# Patient Record
Sex: Female | Born: 1945
Health system: Southern US, Community
[De-identification: ages and names within clinical notes are randomized; demographics above are authoritative.]

## PROBLEM LIST (undated history)

## (undated) DIAGNOSIS — D638 Anemia in other chronic diseases classified elsewhere: Secondary | ICD-10-CM

## (undated) DIAGNOSIS — M1990 Unspecified osteoarthritis, unspecified site: Secondary | ICD-10-CM

## (undated) DIAGNOSIS — R701 Abnormal plasma viscosity: Secondary | ICD-10-CM

## (undated) DIAGNOSIS — E7800 Pure hypercholesterolemia, unspecified: Secondary | ICD-10-CM

## (undated) DIAGNOSIS — K219 Gastro-esophageal reflux disease without esophagitis: Secondary | ICD-10-CM

## (undated) DIAGNOSIS — I340 Nonrheumatic mitral (valve) insufficiency: Secondary | ICD-10-CM

## (undated) DIAGNOSIS — I5032 Chronic diastolic (congestive) heart failure: Secondary | ICD-10-CM

## (undated) DIAGNOSIS — I10 Essential (primary) hypertension: Secondary | ICD-10-CM

## (undated) DIAGNOSIS — R7989 Other specified abnormal findings of blood chemistry: Secondary | ICD-10-CM

## (undated) DIAGNOSIS — D649 Anemia, unspecified: Secondary | ICD-10-CM

## (undated) DIAGNOSIS — F419 Anxiety disorder, unspecified: Secondary | ICD-10-CM

## (undated) DIAGNOSIS — N183 Chronic kidney disease, stage 3 unspecified: Secondary | ICD-10-CM

## (undated) DIAGNOSIS — Z9289 Personal history of other medical treatment: Secondary | ICD-10-CM

## (undated) DIAGNOSIS — E785 Hyperlipidemia, unspecified: Secondary | ICD-10-CM

## (undated) DIAGNOSIS — F0390 Unspecified dementia without behavioral disturbance: Secondary | ICD-10-CM

## (undated) DIAGNOSIS — J189 Pneumonia, unspecified organism: Secondary | ICD-10-CM

## (undated) DIAGNOSIS — I447 Left bundle-branch block, unspecified: Secondary | ICD-10-CM

## (undated) DIAGNOSIS — E538 Deficiency of other specified B group vitamins: Secondary | ICD-10-CM

## (undated) DIAGNOSIS — Z9889 Other specified postprocedural states: Secondary | ICD-10-CM

## (undated) HISTORY — DX: Essential (primary) hypertension: I10

## (undated) HISTORY — DX: Unspecified osteoarthritis, unspecified site: M19.90

---
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
## 1999-10-29 ENCOUNTER — Encounter: Payer: Self-pay | Admitting: Obstetrics and Gynecology

## 1999-10-29 ENCOUNTER — Encounter: Admission: RE | Admit: 1999-10-29 | Discharge: 1999-10-29 | Payer: Self-pay | Admitting: Obstetrics and Gynecology

## 2000-11-25 ENCOUNTER — Encounter: Admission: RE | Admit: 2000-11-25 | Discharge: 2000-11-25 | Payer: Self-pay | Admitting: Obstetrics and Gynecology

## 2000-11-25 ENCOUNTER — Encounter: Payer: Self-pay | Admitting: Obstetrics and Gynecology

## 2002-03-31 ENCOUNTER — Encounter: Admission: RE | Admit: 2002-03-31 | Discharge: 2002-03-31 | Payer: Self-pay | Admitting: Internal Medicine

## 2002-03-31 ENCOUNTER — Encounter: Payer: Self-pay | Admitting: Obstetrics and Gynecology

## 2002-07-20 HISTORY — PX: COLONOSCOPY: SHX174

## 2002-07-20 HISTORY — PX: ESOPHAGOGASTRODUODENOSCOPY: SHX1529

## 2002-08-18 ENCOUNTER — Encounter (INDEPENDENT_AMBULATORY_CARE_PROVIDER_SITE_OTHER): Payer: Self-pay | Admitting: Specialist

## 2002-08-18 ENCOUNTER — Ambulatory Visit (HOSPITAL_COMMUNITY): Admission: RE | Admit: 2002-08-18 | Discharge: 2002-08-18 | Payer: Self-pay | Admitting: Gastroenterology

## 2004-05-21 HISTORY — PX: CARDIAC CATHETERIZATION: SHX172

## 2004-05-30 ENCOUNTER — Inpatient Hospital Stay (HOSPITAL_COMMUNITY): Admission: EM | Admit: 2004-05-30 | Discharge: 2004-06-06 | Payer: Self-pay | Admitting: Emergency Medicine

## 2004-06-02 ENCOUNTER — Encounter (INDEPENDENT_AMBULATORY_CARE_PROVIDER_SITE_OTHER): Payer: Self-pay | Admitting: Cardiovascular Disease

## 2004-06-05 ENCOUNTER — Encounter (INDEPENDENT_AMBULATORY_CARE_PROVIDER_SITE_OTHER): Payer: Self-pay | Admitting: Cardiovascular Disease

## 2005-07-22 ENCOUNTER — Encounter: Admission: RE | Admit: 2005-07-22 | Discharge: 2005-07-22 | Payer: Self-pay | Admitting: Obstetrics and Gynecology

## 2006-07-26 ENCOUNTER — Encounter: Admission: RE | Admit: 2006-07-26 | Discharge: 2006-07-26 | Payer: Self-pay | Admitting: Obstetrics and Gynecology

## 2007-08-02 ENCOUNTER — Encounter: Admission: RE | Admit: 2007-08-02 | Discharge: 2007-08-02 | Payer: Self-pay | Admitting: Obstetrics and Gynecology

## 2008-08-09 ENCOUNTER — Ambulatory Visit: Payer: Self-pay | Admitting: Family Medicine

## 2008-08-09 ENCOUNTER — Encounter: Payer: Self-pay | Admitting: *Deleted

## 2008-08-09 DIAGNOSIS — I10 Essential (primary) hypertension: Secondary | ICD-10-CM | POA: Insufficient documentation

## 2008-08-09 DIAGNOSIS — F411 Generalized anxiety disorder: Secondary | ICD-10-CM | POA: Insufficient documentation

## 2008-08-09 DIAGNOSIS — K219 Gastro-esophageal reflux disease without esophagitis: Secondary | ICD-10-CM | POA: Insufficient documentation

## 2008-08-09 LAB — CONVERTED CEMR LAB
AST: 19 units/L (ref 0–37)
Albumin: 4.5 g/dL (ref 3.5–5.2)
Alkaline Phosphatase: 82 units/L (ref 39–117)
BUN: 28 mg/dL — ABNORMAL HIGH (ref 6–23)
Glucose, Bld: 108 mg/dL — ABNORMAL HIGH (ref 70–99)
Potassium: 5.2 meq/L (ref 3.5–5.3)
Sodium: 138 meq/L (ref 135–145)
Total Bilirubin: 0.4 mg/dL (ref 0.3–1.2)
Total Protein: 7.6 g/dL (ref 6.0–8.3)

## 2008-08-20 ENCOUNTER — Encounter: Payer: Self-pay | Admitting: Family Medicine

## 2008-08-22 ENCOUNTER — Telehealth: Payer: Self-pay | Admitting: Family Medicine

## 2008-08-22 ENCOUNTER — Ambulatory Visit: Payer: Self-pay | Admitting: Family Medicine

## 2008-09-05 ENCOUNTER — Encounter: Payer: Self-pay | Admitting: Family Medicine

## 2008-09-05 ENCOUNTER — Encounter: Admission: RE | Admit: 2008-09-05 | Discharge: 2008-09-05 | Payer: Self-pay | Admitting: Obstetrics and Gynecology

## 2008-09-11 ENCOUNTER — Encounter: Payer: Self-pay | Admitting: Family Medicine

## 2009-01-21 ENCOUNTER — Telehealth: Payer: Self-pay | Admitting: *Deleted

## 2009-02-08 ENCOUNTER — Encounter: Payer: Self-pay | Admitting: *Deleted

## 2009-02-08 ENCOUNTER — Ambulatory Visit: Payer: Self-pay | Admitting: Family Medicine

## 2009-02-08 ENCOUNTER — Encounter: Payer: Self-pay | Admitting: Family Medicine

## 2009-02-08 LAB — CONVERTED CEMR LAB
HDL: 47 mg/dL (ref >39)
LDL Cholesterol: 142 mg/dL — ABNORMAL HIGH (ref 0–99)
Triglycerides: 130 mg/dL (ref <150)
VLDL: 26 mg/dL (ref 0–40)

## 2009-02-15 ENCOUNTER — Telehealth: Payer: Self-pay | Admitting: *Deleted

## 2009-02-18 ENCOUNTER — Encounter: Payer: Self-pay | Admitting: Family Medicine

## 2009-04-01 ENCOUNTER — Ambulatory Visit: Payer: Self-pay | Admitting: Family Medicine

## 2009-04-03 ENCOUNTER — Encounter: Payer: Self-pay | Admitting: Family Medicine

## 2009-04-03 ENCOUNTER — Encounter: Admission: RE | Admit: 2009-04-03 | Discharge: 2009-04-03 | Payer: Self-pay | Admitting: Family Medicine

## 2009-04-17 ENCOUNTER — Encounter: Payer: Self-pay | Admitting: Family Medicine

## 2009-04-22 ENCOUNTER — Ambulatory Visit: Payer: Self-pay | Admitting: Family Medicine

## 2009-06-19 ENCOUNTER — Telehealth: Payer: Self-pay | Admitting: *Deleted

## 2009-06-21 ENCOUNTER — Telehealth (INDEPENDENT_AMBULATORY_CARE_PROVIDER_SITE_OTHER): Payer: Self-pay | Admitting: *Deleted

## 2009-10-29 ENCOUNTER — Encounter: Admission: RE | Admit: 2009-10-29 | Discharge: 2009-10-29 | Payer: Self-pay | Admitting: Family Medicine

## 2010-02-03 ENCOUNTER — Ambulatory Visit: Payer: Self-pay | Admitting: Family Medicine

## 2010-02-13 ENCOUNTER — Encounter: Payer: Self-pay | Admitting: Family Medicine

## 2010-03-20 ENCOUNTER — Telehealth: Payer: Self-pay | Admitting: *Deleted

## 2010-03-26 ENCOUNTER — Ambulatory Visit: Payer: Self-pay | Admitting: Family Medicine

## 2010-03-26 ENCOUNTER — Encounter: Payer: Self-pay | Admitting: Family Medicine

## 2010-03-26 DIAGNOSIS — E669 Obesity, unspecified: Secondary | ICD-10-CM | POA: Insufficient documentation

## 2010-03-26 LAB — CONVERTED CEMR LAB
ALT: 19 units/L (ref 0–35)
AST: 18 units/L (ref 0–37)
CO2: 24 meq/L (ref 19–32)
Creatinine, Ser: 1.18 mg/dL (ref 0.40–1.20)
Sodium: 141 meq/L (ref 135–145)
Total Bilirubin: 0.3 mg/dL (ref 0.3–1.2)
Total Protein: 6.8 g/dL (ref 6.0–8.3)

## 2010-03-28 ENCOUNTER — Encounter: Payer: Self-pay | Admitting: Family Medicine

## 2010-05-12 ENCOUNTER — Encounter (INDEPENDENT_AMBULATORY_CARE_PROVIDER_SITE_OTHER): Payer: Self-pay | Admitting: *Deleted

## 2010-05-20 NOTE — Assessment & Plan Note (Signed)
Summary: flu shot,df  Nurse Visit   Vital Signs:  Patient profile:   65 year old female Temp:     98.3 degrees F  Vitals Entered By: Marcell Barlow RN (February 04, 2010 8:58 AM)  Immunizations Administered:  Influenza Vaccine # 1:    Vaccine Type: Fluvax MCR    Site: left deltoid    Mfr: GlaxoSmithKline    Dose: 0.5 ml    Route: IM    Given by: Marcell Barlow RN    Exp. Date: 10/15/2010    Lot #: [id number]    VIS given: 11/11/06 version given February 04, 2010.  Flu Vaccine Consent Questions:    Do you have a history of severe allergic reactions to this vaccine? no    Any prior history of allergic reactions to egg and/or gelatin? no    Do you have a sensitivity to the preservative Thimersol? no    Do you have a past history of Guillan-Barre Syndrome? no    Do you currently have an acute febrile illness? no    Have you ever had a severe reaction to latex? no    Vaccine information given and explained to patient? yes    Are you currently pregnant? no  Orders Added: 1)  Influenza Vaccine MCR [00025] 2)  Administration Flu vaccine - MCR [id number]

## 2010-05-20 NOTE — Progress Notes (Signed)
Summary: pls call  Phone Note Call from Patient Call back at Home Phone [phone]   Caller: Patient Summary of Call: pt returned call Initial call taken by: Audie Clear,  June 19, 2009 10:01 AM  Follow-up for Phone Call        Patient said she transferred to [facility] but the prices were double what she was paying at [facility] so shes transferring back and only has enough left for about a week. Message to MD Follow-up by: Levert Feinstein LPN,  March  2, 624THL 10:16 AM  Additional Follow-up for Phone Call Additional follow up Details #1::        Patient informed. Additional Follow-up by: Levert Feinstein LPN,  March  2, 624THL 11:03 AM    Prescriptions: ALPRAZOLAM 0.25 MG TABS (ALPRAZOLAM) take 1 tablet by mouth once 2  times daily as needed for anxiety  #62 x 6   Entered and Authorized by:   Talbert Cage MD   Signed by:   Talbert Cage MD on 06/19/2009   Method used:   Telephoned to ...       [facility]       [address]       [place], [place]  [address]       Ph: [phone]       Fax: [phone]   RxID:   [id number]  Please call in this Rx thanks [facility]

## 2010-05-20 NOTE — Miscellaneous (Signed)
  Clinical Lists Changes  Medications: Added new medication of ZOSTAVAX 60454 UNT/0.65ML SOLR (ZOSTER VACCINE LIVE) sub q in deltoid - Signed Rx of ZOSTAVAX 09811 UNT/0.65ML SOLR (ZOSTER VACCINE LIVE) sub q in deltoid;  #1 x 0;  Signed;  Entered by: Talbert Cage MD;  Authorized by: Talbert Cage MD;  Method used: Electronically to [facility]*, [address], [place], [place]  [id number], Ph: [phone], Fax: [phone]    Prescriptions: Hannah [name] 91478 UNT/0.65ML SOLR (ZOSTER VACCINE LIVE) sub q in deltoid  #1 x 0   Entered and Authorized by:   Talbert Cage MD   Signed by:   Talbert Cage MD on 02/13/2010   Method used:   Electronically to        [facility] (retail)       [place], [place]  [id number]       Ph: [phone]       Fax: [phone]   RxID:   [id number]

## 2010-05-20 NOTE — Progress Notes (Signed)
  Phone Note Refill Request Call back at [phone]   Refills Requested: Medication #1:  METOPROLOL SUCCINATE 100 MG XR24H-TAB 1 daily for blood pressure Hannah [name] need refill but would like to have a 18month supply.  The medicine is going up from 5.00 to 45.00 after the 1st of the year.  She will need something else comparable to the Metoprolol then.  Please call her to let her know if she can have the 2m when refilled  Initial call taken by: Eusebio Friendly,  March 20, 2010 3:04 PM  Follow-up for Phone Call        Pls call and let know since she is has not been see since Jan Will need an OV.  Can refill for one month if needs before seen thanks [place] Follow-up by: Talbert Cage MD,  March 21, 2010 9:09 AM  Additional Follow-up for Phone Call Additional follow up Details #1::        Spoke with pt., she will come in for ov on 03/26/10 to speak with MD. Additional Follow-up by: Levert Feinstein LPN,  December  2, 624THL 2:11 PM

## 2010-05-20 NOTE — Assessment & Plan Note (Signed)
Summary: FU AND BP CK/KH   Vital Signs:  Patient profile:   65 year old female Height:      61 inches Weight:      178 pounds BMI:     33.75 BSA:     1.80 Temp:     99.0 degrees F Pulse rate:   86 / minute BP sitting:   156 / 82  Vitals Entered By: Morene Crocker  3, 2011 2:52 PM) CC: F/U BP Is Patient Diabetic? No Pain Assessment Patient in pain? no        Primary Care Provider:  Talbert Cage MD  CC:  F/U BP.  History of Present Illness: Current Problems:  ACTINIC KERATOSIS, HEAD (ICD-702.0) would like off today  HYPERTENSION (ICD-401.9) feels has white coat hypertension.  Has not gotten a monitor yet.  No problems with increased beta blocker.  No chest pain or lightheadedness  GERD (ICD-530.81) never bleeding ulcers or weight loss or food sticking  ROS - as above PMH - Medications reviewed and updated in medication list.  Smoking Status noted in VS form     Habits & Providers  Alcohol-Tobacco-Diet     Tobacco Status: never  Current Medications (verified): 1)  Lisinopril-Hydrochlorothiazide 20-25 Mg Tabs (Lisinopril-Hydrochlorothiazide) .... Take 1 Talbet By Mouth Once Daily For Blood Pressure 2)  Metoprolol Succinate 100 Mg Xr24h-Tab (Metoprolol Succinate) .Marland Kitchen.. 1 Daily For Blood Pressure 3)  Alprazolam 0.25 Mg Tabs (Alprazolam) .... Take 1 Tablet By Mouth Once 2  Times Daily As Needed For Anxiety 4)  Bayer Aspirin Ec Low Dose 81 Mg Tbec (Aspirin) .... Take 1 Tablet By Mouth Once Daily 5)  Omeprazole 20 Mg Cpdr (Omeprazole) .... One By Mouth Daily  Physical Exam  Lungs:  Normal respiratory effort, chest expands symmetrically. Lungs are clear to auscultation, no crackles or wheezes. Heart:  Normal rate and regular rhythm. S1 and S2 normal without gallop, murmur, click, rub or other extra sounds. Skin:  AK on forehead and numerous milia    Impression & Recommendations:  Problem # 1:  HYPERTENSION (ICD-401.9)  Improved on blood  pressure recheck here.  Continue current regimen and get blood pressure cuff to follow Her updated medication list for this problem includes:    Lisinopril-hydrochlorothiazide 20-25 Mg Tabs (Lisinopril-hydrochlorothiazide) .Marland Kitchen... Take 1 talbet by mouth once daily for blood pressure    Metoprolol Succinate 100 Mg Xr24h-tab (Metoprolol succinate) .Marland Kitchen... 1 daily for blood pressure  BP today: 156/82 Prior BP: 160/84 (04/01/2009)  Labs Reviewed: K+: 5.2 (08/09/2008) Creat: : 1.16 (08/09/2008)   Chol: 215 (02/08/2009)   HDL: 47 (02/08/2009)   LDL: 142 (02/08/2009)   TG: 130 (02/08/2009)  Orders: [place]- Est Level  3 [id number])  Problem # 2:  ACTINIC KERATOSIS, HEAD (ICD-702.0) no nitrogen today will freeze when see with her husband   Problem # 3:  GERD (ICD-530.81)  stable.  Wll change to omeprazole  Her updated medication list for this problem includes:    Omeprazole 20 Mg Cpdr (Omeprazole) ..... One by mouth daily  Orders: [facility]- Est Level  3 [id number])  Complete Medication List: 1)  Lisinopril-hydrochlorothiazide 20-25 Mg Tabs (Lisinopril-hydrochlorothiazide) .... Take 1 talbet by mouth once daily for blood pressure 2)  Metoprolol Succinate 100 Mg Xr24h-tab (Metoprolol succinate) .Marland Kitchen.. 1 daily for blood pressure 3)  Alprazolam 0.25 Mg Tabs (Alprazolam) .... Take 1 tablet by mouth once 2  times daily as needed for anxiety 4)  Bayer Aspirin Ec Low Dose 81 Mg Tbec (Aspirin) .... Take  1 tablet by mouth once daily 5)  Omeprazole 20 Mg Cpdr (Omeprazole) .... One by mouth daily  Patient Instructions: 1)  Please schedule a follow-up appointment in 6  months .  2)  Check your  Blood Pressure regularly . If it is above 140/90 regularly:   you should make an appointment. Prescriptions: OMEPRAZOLE 20 MG CPDR (OMEPRAZOLE) one by mouth daily  #90 x 3   Entered and Authorized by:   Talbert Cage MD   Signed by:   Talbert Cage MD on 04/22/2009   Method used:   Electronically to        [facility] (retail)       [address]       [place], [place]  [address]       Ph: [phone]       Fax: [phone]   RxID:   [id number]    Prevention & Chronic Care Immunizations   Influenza vaccine: Fluvax Non-MCR  (02/08/2009)    Tetanus booster: Not documented    Pneumococcal vaccine: Pneumovax  (04/01/2009)    H. zoster vaccine: Not documented   H. zoster vaccine deferral: Deferred  (04/22/2009)  Colorectal Screening   Hemoccult: Not documented   Hemoccult due: Not Indicated    Colonoscopy: Dr Collene Mares  (04/20/2004)   Colonoscopy due: 04/20/2014  Other Screening   Pap smear: NEGATIVE FOR INTRAEPITHELIAL LESIONS OR MALIGNANCY.  (04/01/2009)   Pap smear due: 03/20/2009    Mammogram: Normal  (09/18/2008)   Mammogram due: 09/2009    DXA bone density scan: Not documented   DXA bone density action/deferral: Ordered  (04/01/2009)   Smoking status: never  (04/22/2009)  Lipids   Total Cholesterol: 215  (02/08/2009)   LDL: 142  (02/08/2009)   LDL Direct: Not documented   HDL: 47  (02/08/2009)   Triglycerides: 130  (02/08/2009)  Hypertension   Last Blood Pressure: 156 / 82  (04/22/2009)   Serum creatinine: 1.16  (08/09/2008)   Serum potassium 5.2  (08/09/2008)    Hypertension flowsheet reviewed?: Yes   Progress toward BP goal: Improved  Self-Management Support :    Hypertension self-management support: BP self-monitoring log, Written self-care plan  (04/01/2009)

## 2010-05-20 NOTE — Progress Notes (Signed)
Summary: Rx Prob  Phone Note Call from Patient Call back at Home Phone [phone]   Caller: Patient Summary of Call: Pt states rx for her anxiety medicine is not at the pharmacy. Initial call taken by: Raymond Gurney,  June 21, 2009 10:55 AM  Follow-up for Phone Call        Rx called to pharmacy for Alprazolam. patient received 2 refills on [facility]. she had rx transferred to [facility] . the medication was too expensive at [facility] so she transferred it back to [facility]. however [facility] required a new RX. rx given verbally with a total of 3 refills. one tpday and 2 additional. Follow-up by: Marcell Barlow RN,  June 21, 2009 1:31 PM

## 2010-05-20 NOTE — Assessment & Plan Note (Signed)
Summary: f/u meds   Vital Signs:  Patient profile:   65 year old female Height:      61 inches Weight:      195.06 pounds BMI:     36.99 Temp:     98.3 degrees F oral Pulse rate:   90 / minute BP sitting:   138 / 78  (left arm)  Vitals Entered By: Hedy Camara (March 26, 2010 10:12 AM) CC: F/U Medication Is Patient Diabetic? No Pain Assessment Patient in pain? no        Primary Care Provider:  Talbert Cage MD  CC:  F/U Medication.  History of Present Illness: HYPERTENSION (ICD-401.9) Disease Monitoring   Blood pressure range: almost all belw 14/90      Chest pain: N     Dyspnea:N Medications   Compliance: daily   Lightheadedness: N     Edema:N Prevention   Exercise: garden     Salt restriction:yes    GERD (ICD-530.81) taking PPI as needed.  No bleeding or problems swallowing.     ANXIETY (ICD-300.00) uses xanax two times a day regularly.  Feels it is under good control.   No depressive thoguhts.  Her anxiety does not interfere wiht her life  ROS - as above PMH - Medications reviewed and updated in medication list.  Smoking Status noted in VS form      Habits & Providers  Alcohol-Tobacco-Diet     Tobacco Status: quit > 6 months  -  Date:  05/04/2008    Bone Density T score -1.5 at hip   Current Medications (verified): 1)  Lisinopril-Hydrochlorothiazide 20-25 Mg Tabs (Lisinopril-Hydrochlorothiazide) .... Take 1 Talbet By Mouth Once Daily For Blood Pressure 2)  Alprazolam 0.25 Mg Tabs (Alprazolam) .... Take 1 Tablet By Mouth Once 2  Times Daily As Needed For Anxiety 3)  Bayer Aspirin Ec Low Dose 81 Mg Tbec (Aspirin) .... Take 1 Tablet By Mouth Once Daily 4)  Omeprazole 20 Mg Cpdr (Omeprazole) .... One By Mouth Daily 5)  Zostavax 19400 Unt/0.29ml Solr (Zoster Vaccine Live) .... Sub Q in Deltoid 6)  Carvedilol 12.5 Mg Tabs (Carvedilol) .Marland Kitchen.. 1 By Mouth Two Times A Day  Social History: Smoking Status:  quit > 6 months  Physical Exam  General:   Well-developed,well-nourished,in no acute distress; alert,appropriate and cooperative throughout examination Neck:  No deformities, masses, or tenderness noted. Lungs:  Normal respiratory effort, chest expands symmetrically. Lungs are clear to auscultation, no crackles or wheezes. Heart:  Normal rate and regular rhythm. S1 and S2 normal without gallop, murmur, click, rub or other extra sounds. Extremities:  No clubbing, cyanosis, edema, or deformity noted with normal full range of motion of all joints.     Impression & Recommendations:  Problem # 1:  HYPERTENSION (ICD-401.9) Will change from metorpolol XR to coreg since much cheaper [place] blood pressure  The following medications were removed from the medication list:    Metoprolol Succinate 100 Mg Xr24h-tab (Metoprolol succinate) .Marland Kitchen... 1 daily for blood pressure Her updated medication list for this problem includes:    Lisinopril-hydrochlorothiazide 20-25 Mg Tabs (Lisinopril-hydrochlorothiazide) .Marland Kitchen... Take 1 talbet by mouth once daily for blood pressure    Carvedilol 12.5 Mg Tabs (Carvedilol) .Marland Kitchen... 1 by mouth two times a day  Orders: [facility] [id number]) [facility]- Est  Level 4 (99214)  BP today: 138/78 Prior BP: 156/82 (04/22/2009)  Labs Reviewed: K+: 5.2 (08/09/2008) Creat: : 1.16 (08/09/2008)   Chol: 215 (02/08/2009)   HDL: 47 (02/08/2009)  LDL: 142 (02/08/2009)   TG: 130 (02/08/2009)  Problem # 2:  GERD (ICD-530.81)  stable on as needed PPI Her updated medication list for this problem includes:    Omeprazole 20 Mg Cpdr (Omeprazole) ..... One by mouth daily  Orders: [place]- Est  Level 4 [id number])  Problem # 3:  ANXIETY (ICD-300.00)  controlled on stable dose of Alprazolam  Her updated medication list for this problem includes:    Alprazolam 0.25 Mg Tabs (Alprazolam) .Marland Kitchen... Take 1 tablet by mouth once 2  times daily as needed for anxiety  Orders: [place]- Est  Level 4 [id number])  Problem # 4:  OBESITY (ICD-278.00) BMI 36.   She has lost weight before and thinks she can do so now.  Discussed portion control.  Her son is a Physiological scientist.  She might be interested in seeing our health educator  Complete Medication List: 1)  Lisinopril-hydrochlorothiazide 20-25 Mg Tabs (Lisinopril-hydrochlorothiazide) .... Take 1 talbet by mouth once daily for blood pressure 2)  Alprazolam 0.25 Mg Tabs (Alprazolam) .... Take 1 tablet by mouth once 2  times daily as needed for anxiety 3)  Bayer Aspirin Ec Low Dose 81 Mg Tbec (Aspirin) .... Take 1 tablet by mouth once daily 4)  Omeprazole 20 Mg Cpdr (Omeprazole) .... One by mouth daily 5)  Zostavax 19400 Unt/0.52ml Solr (Zoster vaccine live) .... Sub q in deltoid 6)  Carvedilol 12.5 Mg Tabs (Carvedilol) .Marland Kitchen.. 1 by mouth two times a day  Patient Instructions: 1)  Please schedule a follow-up appointment in 6 months .  2)  Call if your blood pressure is regularly > 140/90 3)  I will call you if your lab is abnormal otherwise I will send you a letter within 2 weeks. If you do not get a letter in 2 weeks pls call 4)  Consider the zostavax next year  5)  Work on your diet aim is to lose 2 lbs per week Prescriptions: ALPRAZOLAM 0.25 MG TABS (ALPRAZOLAM) take 1 tablet by mouth once 2  times daily as needed for anxiety  #60 x 6   Entered and Authorized by:   Talbert Cage MD   Signed by:   Talbert Cage MD on 03/26/2010   Method used:   Handwritten   [id number] CARVEDILOL 12.5 MG TABS (CARVEDILOL) 1 by mouth two times a day  #60 x 3   Entered and Authorized by:   Talbert Cage MD   Signed by:   Talbert Cage MD on 03/26/2010   Method used:   Electronically to        [facility] (retail)       [place], [place]  [address]       Ph: [phone]       Fax: [phone]   RxID:   [id number] LISINOPRIL-HYDROCHLOROTHIAZIDE 20-25 MG TABS (LISINOPRIL-HYDROCHLOROTHIAZIDE) take 1 talbet by mouth once daily  for blood pressure  #90 x 3   Entered and Authorized by:   Talbert Cage MD   Signed by:   Talbert Cage MD on 03/26/2010   Method used:   Electronically to        [facility] (retail)       [address]       [place], [place]  [address]       Ph: [phone]  Fax: [phone]   RxID:   [id number]    Orders Added: 1)  Comp Met-[facility] [id number] 2)  [facility]- Est  Level 4 [id number]    Prevention & Chronic Care Immunizations   Influenza vaccine: Fluvax MCR  (02/03/2010)    Tetanus booster: Not documented    Pneumococcal vaccine: Pneumovax  (04/01/2009)    H. zoster vaccine: Not documented   H. zoster vaccine deferral: Deferred  (04/22/2009)  Colorectal Screening   Hemoccult: Not documented   Hemoccult due: Not Indicated    Colonoscopy: Dr Collene Mares  (04/20/2004)   Colonoscopy due: 04/20/2014  Other Screening   Pap smear: NEGATIVE FOR INTRAEPITHELIAL LESIONS OR MALIGNANCY.  (04/01/2009)   Pap smear due: 03/20/2009    Mammogram: ASSESSMENT: Negative - BI-RADS 1^MM DIGITAL SCREENING  (10/29/2009)   Mammogram due: 09/2009    DXA bone density scan: T score -1.5 at hip   (05/04/2008)   DXA bone density action/deferral: Ordered  (04/01/2009)   Smoking status: quit > 6 months  (03/26/2010)  Lipids   Total Cholesterol: 215  (02/08/2009)   LDL: 142  (02/08/2009)   LDL Direct: Not documented   HDL: 47  (02/08/2009)   Triglycerides: 130  (02/08/2009)  Hypertension   Last Blood Pressure: 138 / 78  (03/26/2010)   Serum creatinine: 1.16  (08/09/2008)   Serum potassium 5.2  (08/09/2008) CMP ordered     Hypertension flowsheet reviewed?: Yes   Progress toward BP goal: At goal  Self-Management Support :    Hypertension self-management support: BP self-monitoring log, Written self-care plan  (04/01/2009)

## 2010-05-22 NOTE — Letter (Signed)
Summary: Generic Letter  [facility]  [address]   [place], [place] [address]   Phone: [phone]  Fax: [phone]     05/12/2010  [address] [place], [place]  [address]  Dear Hannah [name],  We are happy to let you know that since you are covered under Medicare you are able to have a FREE visit at the [facility] to discuss your HEALTH. This is a new benefit for Medicare.  There will be no co-payment.  At this visit you will meet with Lamont Dowdy an expert in wellness and the health coach at our clinic.  At this visit we will discuss ways to keep you healthy and feeling well.  This visit will not replace your regular doctor visit and we cannot refill medications.     You will need to plan to be here at least one hour to talk about your medical history, your current status, review all of your medications, and discuss your future plans for your health.  This information will be entered into your record for your doctor to have and review.  If you are interested in staying healthy, this type of visit can help.  Please call the office at: [phone], to schedule a "Medicare Wellness Visit".  The day of the visit you should bring in all of your medications, including any vitamins, herbs, over the counter products you take.  Make a list of all the other doctors that you see, so we know who they are. If you have any other health documents please bring them.  We look forward to helping you stay healthy.  Sincerely,   Hannah [name]

## 2010-05-22 NOTE — Letter (Signed)
Summary: Generic Letter  [facility]  [address]   [place], [place] [address]   Phone: [phone]  Fax: [phone]    03/28/2010  Hannah [name] [address] [place], [place]  [address]  Dear Ms. Pontius,  Glad to say your blood tests are all good.  Hope you have a great holiday.   Good luck with the weight control.  Sincerely,   Talbert Cage MD  Appended Document: Generic Letter mailed

## 2010-05-28 ENCOUNTER — Other Ambulatory Visit: Payer: Self-pay | Admitting: Family Medicine

## 2010-05-28 MED ORDER — OMEPRAZOLE 20 MG PO CPDR: 20.0000 mg | DELAYED_RELEASE_CAPSULE | Freq: Every day | ORAL | Status: DC

## 2010-06-05 ENCOUNTER — Ambulatory Visit (INDEPENDENT_AMBULATORY_CARE_PROVIDER_SITE_OTHER): Payer: Medicare Other | Admitting: Home Health Services

## 2010-06-05 ENCOUNTER — Encounter: Payer: Self-pay | Admitting: Home Health Services

## 2010-06-05 VITALS — BP 152/70 | Temp 98.7°F | Ht 61.0 in | Wt 190.0 lb

## 2010-06-05 DIAGNOSIS — Z0000 Encounter for general adult medical examination without abnormal findings: Secondary | ICD-10-CM

## 2010-06-05 NOTE — Patient Instructions (Signed)
1. Work on losing 10 pounds by June 2012.  2. Call [employer] to join the [place] more :)

## 2010-06-05 NOTE — Progress Notes (Signed)
Patient here for annual wellness visit, patient reports: Risk Factors/Conditions needing evaluation or treatment: No risk factors/conditions needing evaluation. Diet: Patient typically only eats breakfast and lunch. Foods are varied but not nutrient dense.  Physical Activity: Patient walks 15 minute a day around the outside of her house. Home Safety: Patient has smoke alarms in her home and does not have adaptive equipment. End of Life Plan: We discussed end of life care and she expressed difficulty talking about this with her husband and children.  I left her with an Advance Directives pamphlet to go over with the family.  Prevention Plan: To increase daily physical activity- join the [employer] program To loss 10 pounds- goal weight of 180 lbs.  To get the Zostavax shot.  Patient identified her husband Grayland Ormond or daughter Gerlean Ren as thier emergency contact at [phone] Other Information: Patient lives in her own home with her husband.  Patient is not sure is she ever received the zostavax shot.  Gave patient pamphlet.   Patient expressed a lot of concern about her husband's condition.  She stated that his health problems are stressful or her. She admits to difficulty of vision while reading and an eye doctor recently prescribed reading glasses.  Balance max value patientvalue  Sitting balance 1 1  Arise 2 2  Attempts to arise 2 2  Immediate standing balance 2 2  Standing balance 1 1  Nudge 2 1  Eyes closed 1 1  360 degree turn 1 1  Sitting down 2 2   Gait max value patient value  Initiation of gait 1 1  Step length-left 1 1  Step length-right 1 1  Step height-left 1 1  Step height-right 1 1  Step symmetry 1 1  Step continuity 1 1  Path 2 2  Trunk 2 2  Walking stance 1 1   Balance/Gait Score: 25/26  Mental Status Exam max value patient value  Orientation to time 5 5  Orientation to place 5 5  Registration 3 3  Attention 5 5  Recall 3 3  Language (name  2 objects) 2 2  Language-repeat 1 1  Language-follow 3 step command 3 3  Language-read and follow directions 1 1   Mental Status Exam: 17/28   Annual Wellness Visit Requirements Recorded Today In  Medical, family, social history Past Medical, Family, and Social History Section  Current providers Care team  Current medications Medications  Wt, BP, Ht, BMI Vital signs  Tobacco, alcohol, illicit drug use History  ADL Nurse Assessment  Depression Screening Nurse Assessment  Cognitive impairment Nurse Assessment  Fall Risk Nurse Assessment  Home Safety Progress Note  End of Life Planning (welcome visit) Progress Note  Medicare preventative services Progress Note  Risk factors/conditions needing evaluation/treatment Progress Note  Personalized health advice Patient Instructions, goals, letter   Medicare Prevention Screenings Women over 15 Test For Frequency Date of Last- BOLD if needed  Breast Cancer 1-2 yrs 10/2009  Cervical Cancer 1-3 yrs 03/2009  Colorectal Cancer 1-10 yrs 04/2004  Osteoporosis once 04/2008  Cholesterol 5 yrs 01/2009  Diabetes yearly Non-diabetic  HIV yearly declined  Influenza Shot yearly 01/2010  Pneumonia Shot once 03/2009  Zostavax Shot once [employer]    I have reviewed this visit and discussed with Lamont Dowdy and agree with her documention CHAMBLISS,MARSHALL L

## 2010-06-09 ENCOUNTER — Encounter: Payer: Self-pay | Admitting: Home Health Services

## 2010-06-17 ENCOUNTER — Encounter: Payer: Self-pay | Admitting: Home Health Services

## 2010-06-17 NOTE — Progress Notes (Deleted)
  Subjective:    Patient ID: Hannah [name], female    DOB: 1946-03-23, 65 y.o.   MRN: [id number]  HPI    Review of Systems     Objective:   Physical Exam        Assessment & Plan:

## 2010-07-24 ENCOUNTER — Other Ambulatory Visit: Payer: Self-pay | Admitting: Family Medicine

## 2010-07-24 DIAGNOSIS — I10 Essential (primary) hypertension: Secondary | ICD-10-CM

## 2010-07-24 NOTE — Telephone Encounter (Signed)
Refill request

## 2010-09-05 NOTE — Op Note (Signed)
NAME:  Hannah [name], Hannah [name]                       ACCOUNT NO.:  [id number]   MEDICAL RECORD NO.:  [id number]                   PATIENT TYPE:  AMB   LOCATION:  ENDO                                 FACILITY:  [facility]   PHYSICIAN:  Nelwyn Salisbury, M.D.               DATE OF BIRTH:  [birth year]   DATE OF PROCEDURE:  08/18/2002  DATE OF DISCHARGE:                                 OPERATIVE REPORT   PROCEDURE:  Screening colonoscopy.   ENDOSCOPIST:  Nelwyn Salisbury, M.D.   INSTRUMENT USED:  Olympus video colonoscope.   INDICATION FOR PROCEDURE:  A 65 year old white female undergoing screening  colonoscopy.  Rule out colonic polyps, masses, etc.   PREPROCEDURE PREPARATION:  Informed consent was procured from the patient.  The patient was fasted for eight hours prior to the procedure and prepped  with a bottle of magnesium citrate and a gallon of GoLYTELY the night prior  o the procedure.   PREPROCEDURE PHYSICAL:  VITAL SIGNS:  The patient had stable vital signs.  NECK:  Supple.  CHEST:  Clear to auscultation.  S1, S2 regular.  ABDOMEN:  Soft with epigastric tenderness on palpation with guarding, no  rebound or rigidity, no hepatosplenomegaly.   DESCRIPTION OF PROCEDURE:  The patient was placed in the left lateral  decubitus position and sedated with an additional 20 mg of Demerol and 2 mg  of Versed intravenously.  Once the patient was adequately sedate and  maintained on low-flow oxygen and continuous cardiac monitoring, the Olympus  video colonoscope was advanced from the rectum to the cecum without  difficulty.  The appendiceal orifice and the ileocecal valve were clearly  visualized and photographed.  The terminal ileum appeared normal and without  lesions.  An inverted diverticulum was seen at 10 cm.  Small internal  hemorrhoids were seen on retroflexion in the rectum.  No other abnormalities  were noted.  The patient tolerated the procedure well without complication.   IMPRESSION:  1. An inverted diverticulum was present at 10 cm.  2. Small, nonbleeding internal hemorrhoids.  3. Otherwise normal colonoscopy up to the terminal ileum.   RECOMMENDATIONS:  1. A high-fiber diet with liberal fluid intake has been advocated for the     patient.  2.     Repeat colorectal cancer screening is recommended in the next five years     unless the patient develops any abnormal symptoms in the interim.  3. Outpatient follow-up in the next two weeks or earlier if need be.                                               Nelwyn Salisbury, M.D.    JNM/MEDQ  D:  08/18/2002  T:  08/19/2002  Job:  [id number]   cc:  Youlanda Roys. [name], M.D.  [address]  [place]  [place] [address]  Fax: [phone]

## 2010-09-05 NOTE — Discharge Summary (Signed)
NAMECORYN, [name] NO.:  [id number]   MEDICAL RECORD NO.:  [id number]          PATIENT TYPE:  INP   LOCATION:  [id number]                         FACILITY:  [facility]   PHYSICIAN:  Cyril Mourning, D.O.    DATE OF BIRTH:  1945/10/20   DATE OF ADMISSION:  05/30/2004  DATE OF DISCHARGE:                           DISCHARGE SUMMARY - REFERRING   PRIMARY CARE PHYSICIAN:  Hannah [name], M.D.   ADMISSION DIAGNOSIS:  Left lower lobe pneumonia.   DISCHARGE DIAGNOSES:  1.  Left lower lobe pneumonia.  2.  Chronic obstructive pulmonary disease with exacerbation.  3.  Cardiomyopathy, status post cardiac catheterization by Dr. Doylene Canard on      June 04, 2004, that did not reveal obstructive coronary artery      disease. On echo, however, there was definite findings of severe mitral      regurgitation.  Her pulmonary artery pressure was 72, suggesting the      chronicity of this finding.  Dr. Doylene Canard is managing her with medical      therapy and following her closely for timing of possible surgical      intervention.  4.  Tobacco dependence.  5.  Anxiety.   MEDICATIONS:  At time of this dictation to be amended by discharging  physician on the day of discharge.  1.  Aspirin 81 mg daily.  2.  Xopenex metered dose inhaler two puffs every six hours as needed.  3.  Protonix 40 mg daily x2 weeks.  4.  Avelox 400 mg daily until June 12, 2004, for course of 14 days total      therapy.  5.  Lopressor 25 mg twice daily.  6.  Lisinopril 10 mg daily with plan to up titrate in the outpatient      setting.  7.  Prednisone taper begin at 40 mg on June 04, 2004, to taper over      seven to 10 days.  8.  Lasix 40 mg daily.  9.  K-Dur 20 mEq with each Lasix dose and recommendations for basic      metabolic panel to be performed in the primary care physician's office,      Dr. Elson Clan, within one week of discharge.   The patient is instructed to remain on a heart healthy diet  and 1500 mL per  day fluid restriction.   CONSULTANTS THIS ADMISSION:  Birdie Riddle, M.D., for mildly elevated  troponins.  Troponin of 0.16.  Cardiac catheterization as noted above.  Findings not suggestive of obstructive coronary artery disease.  She  underwent 2-D echocardiogram  that revealed an ejection fraction of 45 to  50%, pulmonary artery pressure of 72 mml In evaluation of pulmonary artery  hypertension.  CT scan of her chest was ordered that revealed patchy ground  glass opacities in the anterior upper lobes bilaterally and more focal  consolidative changes in the right lower lobe likely from her pneumonia  diagnosis admission. Recommendations were for follow-up CT scan of the chest  in three to six months from time of this CT on June 03, 2004.  No  evidence of mass or lymphadenopathy was described.   LABORATORY DATA:  Sodium 138, potassium 4.3, BUN 23, creatinine 0.8, glucose  135 though she was on steroid therapy.  Her LDL was 80.   Echo and a CT scan as described above.   HISTORY OF PRESENT ILLNESS:  For full details, please refer to H&P as  dictated by Dr. Rexene Alberts, however, briefly, Hannah [name] is a pleasant  65 year old female with a history significant for tobacco abuse and  bronchitis approximately six years ago.  She presented to the hospital with  chief complaint of shortness of breath.  She stated that she had a cold all  week long.  Her symptoms started with a cough, initially dry but then  developed a productive cough with yellow sputum and scratchy throat.  She  had some diffuse muscle aches and subjective fever and chills.  She denied  any headache, nausea or vomiting.  She denied any pleurisy.  She stated that  symptoms were similar to previous bout of bronchitis before. She was  evaluated in the ER and was febrile with a temperature of 100.9 and  respiratory rate of 22 and O2 saturation 91%.  She was placed on O2 therapy.  She did have some  desaturations at 88%.  She was admitted for further  evaluation, management and treatment.  She underwent initial laboratory  studies in the ED with chest x-ray findings of left lower lobe infiltrate.  Her D-dimer was not suggestive of acute PE, therefore, she was placed on  prophylactic dose of low molecular weight heparin.   HOSPITAL COURSE:  Her hospital course was significant for refractory dyspnea  to nebulizer therapy, in addition to a gentle diuresis.  Point of care  markers were ordered and it was noted that she had an elevation in her  troponin of 0.16.  Her BNP was mildly elevated in addition.  She responded  to morphine as well as Lasix and rate controlling therapy as from her  admission.  Her heart rate had remained elevated initially attributed to her  underlying inflammatory process of pneumonia, however, 2-D echocardiogram  was ordered and Dr. Doylene Canard was consulted to address the issues of elevated  troponin and it was discovered that she had depressed EF of 45 to 50% and  pulmonary artery hypertension of 75.  She underwent CT imaging to further  evaluate the pulmonary artery hypertension, however, echocardiogram  did  reveal moderate to severe mitral regurgitation.  Her overnight oximetry was  unremarkable for desaturations.  Her cardiac catheterization was as  described above by Dr. Doylene Canard that she had no obstructive coronary artery  disease; however, for full details of this cardiac catheterization, please  refer to the full report per Dr. Doylene Canard.  His recommendations following the  cardiac catheterization and evaluation of 2-D echocardiogram  were two  attempt medical therapy and revisit the prospect of surgery in the  outpatient setting on a follow-up visit.  At this time she is medically  stable.  She is tapering off of her steroid dose.  I have spoken with Dr.  Doylene Canard and requested she remain at least until tomorrow.  We will follow her course clinically.  If she  is stable, she will be discharged on  medications as described above.      ESS/MEDQ  D:  06/04/2004  T:  06/04/2004  Job:  [id number]   cc:   Hannah [name], M.D.  [address]  [place]  [place] [address]  Fax: [phone]   Birdie Riddle, M.D.  [address]  [place] [address]  Fax: [phone]

## 2010-09-05 NOTE — Op Note (Signed)
NAME:  Hannah [name], Hannah [name]                       ACCOUNT NO.:  [id number]   MEDICAL RECORD NO.:  [id number]                   PATIENT TYPE:  AMB   LOCATION:  ENDO                                 FACILITY:  [facility]   PHYSICIAN:  Nelwyn Salisbury, M.D.               DATE OF BIRTH:  1945-06-12   DATE OF PROCEDURE:  08/18/2002  DATE OF DISCHARGE:                                 OPERATIVE REPORT   PROCEDURE:  Esophagogastroduodenoscopy with biopsies.   ENDOSCOPIST:  Nelwyn Salisbury, M.D.   INSTRUMENT USED:  Olympus video panendoscope.   INDICATIONS FOR PROCEDURE:  A 65 year old white female with history of  epigastric pain and rectal bleeding.  Rule out peptic ulcer disease,  esophagitis, gastritis, etc.   PREPROCEDURE PREPARATION:  Informed consent was procured from the patient.  The patient was fasted for eight hours prior to the procedure.   PREPROCEDURE PHYSICAL EXAMINATION:  VITAL SIGNS: Stable.  NECK:  Supple.  CHEST:  Clear to auscultation. S1 and S2 regular.  ABDOMEN:  Soft with epigastric tenderness on palpation with guarding. No  rebound or rigidity.  No hepatosplenomegaly.   DESCRIPTION OF PROCEDURE:  The patient was placed in the left lateral  decubitus position and sedated with 80 mg of Demerol and 5 mg of Versed  intravenously.  Once the patient was adequately and maintained on low flow  oxygen and continuous cardiac monitoring, the Olympus video panendoscope was  advanced through the mouthpiece, over the tongue, and into the esophagus  under direct vision.  Barrett's-like changes were seen in the distal  esophagus above the Z-line. Biopsies were done to confirm the presence of  Barrett's mucosa.  On advancing the scope into the stomach, there was severe  gastritis throughout the stomach with friable mucosa. Erosions were present  in the antrum. Biopsies were done from the antrum to rule out presence of  H.pylori by pathology.  Duodenal ulcer was seen in the duodenal  bulb without  a visible vessel. There was severe inflammatory change surrounding the  ulcer.  Small bowel residual to the bulb up to 60 cm appeared normal. There  was no outlet obstruction. The patient tolerated the procedure well without  complications.   IMPRESSION:  1. Normal appearing proximal esophagus.  2. Barrett's-like changes in the distal esophagus, biopsies done, results     pending.  3. Severe gastritis with antral erosions, biopsies done for H.pylori.  4. Ulcer in duodenal bulb without visible vessel and severe inflammatory     change.  5. Normal small bowel distal to the bulb up to 60 cm.  6. Retroflexion in the high cardia revealed no abnormalities except for     severe gastritis.    RECOMMENDATIONS:  Await pathology results.  Trial of PPI is being  considered.  Samples of Nexium will be given to her from the office.  Avoid  all nonsteroidals including aspirin for now.  Follow  anti-reflux measures.  Proceed to colonoscopy at this time and further recommendations made after  colonoscopy.  Treat with antibiotics if H.pylori present.                                               Nelwyn Salisbury, M.D.    JNM/MEDQ  D:  08/18/2002  T:  08/19/2002  Job:  [id number]   cc:   Youlanda Roys. [name], M.D.  [address]  [place]  [place] [address]  Fax: [phone]

## 2010-09-05 NOTE — H&P (Signed)
NAMESHANDOLYN, [name] NO.:  [id number]   MEDICAL RECORD NO.:  [id number]          PATIENT TYPE:  INP   LOCATION:  1826                         FACILITY:  [facility]   PHYSICIAN:  Rexene Alberts, M.D.    DATE OF BIRTH:  1945-08-14   DATE OF ADMISSION:  05/30/2004  DATE OF DISCHARGE:                                HISTORY & PHYSICAL   PRIMARY CARE PHYSICIAN:  Youlanda [name]. Hannah [name], M.D.   CHIEF COMPLAINT:  I could not breathe last night.   HISTORY OF PRESENT ILLNESS:  Hannah [name] is a 65 year old lady with a past  medical history significant for tobacco use and history of acute bronchitis  approximately 6 years ago, who presents to the hospital this morning with a  chief complaint of shortness of breath.  The patient states that she has had  a cold all week long.  Her symptoms started with a cough, initially dry, but  now productive, with yellow sputum, runny nose, right ear ache and scratchy  throat.  She also had some mild diffuse muscle aches and subjective fever  and chills.  She denies any headache, nausea, vomiting or diarrhea.  She  denies any pleurisy.  She has heard wheezing sounds reminiscent of her bout  with acute bronchitis approximately 6 years ago.  She does smoke one pack of  cigarettes per day.  She states that she has been trying to quit, however,  she has been unsuccessful.   When the patient was evaluated in the emergency department, she was noted to  have a temperature of 100.9 and a respiratory rate of 22, oxygen saturation  of 91% initially on room air.  She was placed on oxygen therapy and her  oxygen saturations improved to 98%.  However, when she was again taken off  of oxygen, her oxygen saturations fell to 88%.  Her white blood cell count  is within normal limits at 8.4.  The chest x-ray revealed left lower lobe  infiltrate.  The patient will be admitted for further evaluation and  management.   PAST MEDICAL HISTORY:  1.  History of acute  bronchitis approximately 6 years ago.  2.  Tobacco abuse.  3.  Post menopausal on hormone replacement therapy.   MEDICATIONS:  Prempro (dose unknown).   ALLERGIES:  No known drug allergies.   SOCIAL HISTORY:  The patient is married and lives with her husband in  [place], [place].  She has five children.  She is employed as an  Economist.  She completed high school.  She can read and write.  She  smokes one pack of cigarettes per day and has been doing so x20 years.   FAMILY HISTORY:  Her mother is deceased.  She died of Alzheimer's disease at  42 years of age.  Her father is living.  He is 67 years of age and has no  significant health problems.   REVIEW OF SYSTEMS:  Otherwise negative.   PHYSICAL EXAMINATION:  VITAL SIGNS:  Temperature 100.9, blood pressure  141/80, pulse 91, respirations 22, oxygen saturations 88% on room air.  GENERAL:  The patient is a pleasant, alert, 65 year old, Caucasian lady who  is currently lying in bed in no acute distress (status post nebulizer  treatments).  HEENT:  Head is normocephalic, nontraumatic.  Pupils equal round and  reactive to light.  Extraocular movements intact.  Conjunctivae are clear.  Sclerae white.  Tympanic membranes are clear bilaterally with no erythema or  edema.  Nasal mucosa is moist.  No active drainage.  No sinus tenderness.  Oropharynx reveals mildly dry mucous membranes.  No posterior exudates or  erythema.  Teeth are in fair condition.  NECK:  Supple, no adenopathy, thyromegaly, bruit or JVD.  LUNGS:  The patient is breathing comfortably at rest, however, when she  speaks, she is mildly dyspneic.  She has diffuse wheezes throughout all lung  fields, very mild however.  Her air movement is good.  HEART:  S1, S2 with no murmurs, rubs or gallops.  ABDOMEN:  Positive bowel sounds, soft, mildly obese, nontender,  nondistended.  No hepatosplenomegaly.  EXTREMITIES:  Pedal pulses were 2+ bilaterally.  No  pretibial edema or pedal  edema.  SKIN:  The patient has diffuse petechial-like lesions on her skin,  particularly on her torso and her abdomen which she say is normal for her  and she has had for years.  Etiology unknown.  NEUROLOGIC:  The patient is alert and oriented x3.  Cranial nerves 2-12  grossly intact.  Strength is 5/5 throughout.  Sensation is intact.  Gait is  within normal limits.   LABORATORY DATA AND X-RAY FINDINGS:  Chest x-ray revealed faint left lower  lobe infiltrate versus atelectasis.  WBC 8.4, hemoglobin 13.6, MCV 95.4,  platelets 178.   ASSESSMENT:  Left lower lobe pneumonia.  The patient appears to be stable  and in no acute respiratory distress at this moment.  Given that she is a  smoker, she is certainly at risk for deterioration without aggressive  antibiotic treatment.  She is still somewhat bronchospasmic on exam and her  oxygen saturations improve with oxygen therapy.   PLAN:  1.  The patient will be admitted for further management.  2.  Will collect a sputum for routine culture and sensitivity.  3.  Will start Rocephin 1 g IV every 24 hours as well as azithromycin 500 mg      IV every 24 hours.  4.  Nebulizer treatments with Xopenex and Atrovent.  5.  Will give a one time dose of Solu-Medrol and reevaluate for further      steroid treatments each day.  6.  Symptomatic treatment with Tussionex cough syrup and Tessalon Perles.  7.  Nicotine patch.  8.  Nasal cannula oxygen at 2 L per minute.  9.  Will hold Prempro.  10. GI prophylaxis with Protonix.  11. DVT prophylaxis with Lovenox.  12. Will check a CMET.  Blood cultures have been ordered.      DF/MEDQ  D:  05/30/2004  T:  05/30/2004  Job:  [id number]   cc:   Youlanda [name]. Hannah [name], M.D.  [address]  [place]   [address]  Fax: [phone]

## 2010-09-05 NOTE — Cardiovascular Report (Signed)
Hannah [name], Hannah [name]             ACCOUNT NO.:  [id number]   MEDICAL RECORD NO.:  [id number]          PATIENT TYPE:  INP   LOCATION:  3740                         FACILITY:  [facility]   PHYSICIAN:  Birdie Riddle, M.D.  DATE OF BIRTH:  [birth year]   DATE OF PROCEDURE:  06/04/2004  DATE OF DISCHARGE:                              CARDIAC CATHETERIZATION   REFERRED BY:  [facility] Team [facility].   PROCEDURE:  Left heart catheterization, selective coronary angiography, left  ventricular function study.   INDICATIONS:  This 65 year old white female had shortness of breath with  congestive heart failure, abnormal cardiac enzymes, and cardiac risk factors  of smoking.   APPROACH:  Right femoral artery using 5 French sheath and catheters.   COMPLICATIONS:  None.   Less than 60 cc of the dye was used for the procedure.   HEMODYNAMIC DATA:  The left ventricular pressure was 135/17/26, and aortic  pressure 136/64/97.   LEFT VENTRICULOGRAM:  The left ventriculogram showed borderline left  ventricular size with mild generalized hypokinesia.  Ejection fraction of 45-  50%, with moderate to severe mitral regurgitation.   CORONARY ANATOMY:  The left main coronary artery was unremarkable.   Left anterior descending coronary artery.  The left anterior descending  coronary artery showed proximal luminal irregularities, otherwise was  unremarkable.  Diagonal 1 was essentially unremarkable, and diagonal 2 and 3  were very small vessels.   Left circumflex coronary artery.  The left circumflex coronary artery was  also unremarkable.  It had a large ramus branch which was also unremarkable.   Right coronary artery.  The right coronary artery was dominant, and its  posterolateral branch and posterior descending coronary arteries were  unremarkable.   IMPRESSION:  1.  Near normal coronaries.  2.  Mild left ventricular systolic dysfunction.  3.  Moderate to severe mitral regurgitation.   RECOMMENDATIONS:  This patient will continue ACE inhibitor, diuretic, and  other afterload reducers, and may undergo transesophageal echocardiography  post optimal medical treatment.      ASK/MEDQ  D:  06/04/2004  T:  06/04/2004  Job:  [id number]

## 2010-09-25 ENCOUNTER — Other Ambulatory Visit: Payer: Self-pay | Admitting: Family Medicine

## 2010-09-25 NOTE — Telephone Encounter (Signed)
Refill request

## 2010-09-25 NOTE — Telephone Encounter (Signed)
Please call in Rx below Thanks

## 2010-09-25 NOTE — Telephone Encounter (Signed)
Rx called in to [facility] on [address]

## 2010-11-04 ENCOUNTER — Encounter: Payer: Self-pay | Admitting: Family Medicine

## 2010-11-04 ENCOUNTER — Ambulatory Visit (INDEPENDENT_AMBULATORY_CARE_PROVIDER_SITE_OTHER): Payer: Medicare Other | Admitting: Family Medicine

## 2010-11-04 VITALS — BP 132/70 | HR 84 | Temp 99.1°F | Ht 61.0 in | Wt 168.5 lb

## 2010-11-04 DIAGNOSIS — L600 Ingrowing nail: Secondary | ICD-10-CM | POA: Insufficient documentation

## 2010-11-04 MED ORDER — SULFAMETHOXAZOLE-TRIMETHOPRIM 800-160 MG PO TABS: 1.0000 | ORAL_TABLET | Freq: Two times a day (BID) | ORAL | Status: DC

## 2010-11-04 MED ORDER — SULFAMETHOXAZOLE-TRIMETHOPRIM 800-160 MG PO TABS: 1.0000 | ORAL_TABLET | Freq: Two times a day (BID) | ORAL | Status: AC

## 2010-11-04 NOTE — Progress Notes (Signed)
  Subjective:    Patient ID: Hannah [name], female    DOB: 19-Oct-1945, 65 y.o.   MRN: [id number]  HPI Stubbed right great toe several times in the past few weeks due to clumsy ness.  Has no history of neuropathy or poor blood flow.  In the past few days the outside corner of her right great toe became very inflamed and tender, now draining pus.  She states she has cut her nails short and did have an ingrown nail but she thinks she got all of it.  She showed it to a pharmacist and was told she also has nail fungus and would like oral treatment for this. Review of Systems No fever, chills, extending redness, or other skin lesions.    Objective:   Physical Exam .right great toe:  Outside corner inflames with erthema and abscess which is draining.  Appears to have small fragment of nail still stuck in corner.  Very painful to touch.  Both great toes with nail polish, possibly some thickening, other nails appear to be normal.  Cannot fully assess due to nail polish.       Assessment & Plan:

## 2010-11-04 NOTE — Patient Instructions (Signed)
Take nail polish off, let nails grow, and return for evaluation for nail fungus See information on ingrown nail care.  It is very improtant to remove nail that is irritating it in the corner in order for it to heal.    Infected Ingrown Toenail An infected ingrown toenail occurs when the nail edge grows into the skin and bacteria invade the area. Symptoms include pain, tenderness, swelling, and pus drainage from the edge of the nail. Poorly fitting shoes, minor injuries, and improper cutting of the toenail may also contribute to the problem. You should cut your toenails squarely instead of rounding the edges. Do not cut them too short. Avoid tight or pointed toe shoes. Sometimes the ingrown portion of the nail must be removed. If your toenail is removed, it can take 3-4 months for it to re-grow. HOME CARE INSTRUCTIONS  Soak your infected toe in warm water for 20-30 minutes, 2 to 3 times a day.   Packing or dressings applied to the area should be changed daily.   Take medicine as directed and finish them.   Reduce activities and keep your foot elevated when able to reduce swelling and discomfort. Do this until the infection gets better.   Wear sandals or go barefoot as much as possible while the infected area is sensitive.   See your caregiver for follow-up care in 2-3 days if the infection is not better.  SEEK MEDICAL CARE IF:  Your toe is becoming more red, swollen or painful.  MAKE SURE YOU:    Understand these instructions.   Will watch your condition.   Will get help right away if you are not doing well or get worse.  Document Released: 05/14/2004 Document Re-Released: 07/03/2008 [facility] Patient Information 2011 [employer].

## 2010-11-04 NOTE — Assessment & Plan Note (Signed)
Will treat with bactrim x 7 days.  She declines having me removed possible nail spicule stil left after she cut her toenail.  She will go home and soak it, plan to cut it herself.  Advised if she is not able to remove it will continue to be inflamed and to please call for appt to have [place] remove it- she is agreeable.  Unable to fully assess nails due to polish and nails cut very short and unable to obtain scraping.  Advised to remove polish, let nails grow out and return for visit once infection resolved.  We discuss that all options for treatment have high failure and recurrence rate.  She is not interested int topical treatments at this time and would like to further discuss oral medications.

## 2010-11-12 ENCOUNTER — Other Ambulatory Visit: Payer: Self-pay | Admitting: Family Medicine

## 2010-11-12 DIAGNOSIS — Z1231 Encounter for screening mammogram for malignant neoplasm of breast: Secondary | ICD-10-CM

## 2010-12-03 ENCOUNTER — Encounter: Payer: Self-pay | Admitting: Family Medicine

## 2010-12-03 ENCOUNTER — Ambulatory Visit (INDEPENDENT_AMBULATORY_CARE_PROVIDER_SITE_OTHER): Payer: Medicare Other | Admitting: Family Medicine

## 2010-12-03 VITALS — BP 159/74 | HR 82 | Temp 98.1°F | Wt 165.0 lb

## 2010-12-03 DIAGNOSIS — L600 Ingrowing nail: Secondary | ICD-10-CM

## 2010-12-03 DIAGNOSIS — B351 Tinea unguium: Secondary | ICD-10-CM | POA: Insufficient documentation

## 2010-12-03 MED ORDER — TERBINAFINE HCL 250 MG PO TABS: 250.0000 mg | ORAL_TABLET | Freq: Every day | ORAL | Status: DC

## 2010-12-03 NOTE — Patient Instructions (Signed)
Funginail, fungicure Ringworm - Nail (Tinea Unguium/Onychomycosis) A fungal infection of the nail (tinea unguium/onychomycosis) is common. It is common as the visible part of the nail is composed of dead cells which have no blood supply to help prevent infection. It occurs because fungi are everywhere and will pick any opportunity to grow on any dead material. Because nails are very slow growing they require up to 2 years of treatment with anti-fungal medications. The entire nail back to the base is infected. This includes approximately ? of the nail which you cannot even see. If your caregiver has prescribed a medication by mouth, take it every day and as directed. No progress will be seen for at least 6 to 9 months. Do not be disappointed! Because fungi live on dead cells with little or no exposure to blood supply, medication delivery to the infection is slow; thus the cure is slow. It is also why you can observe no progress in the first 6 months. The nail becoming cured is the base of the nail, as it has the blood supply. Topical medication such as creams and ointments are usually not effective. Important in successful treatment of nail fungus is closely following the medication regimen that your doctor prescribes. Sometimes you and your caregiver may elect to speed up this process by surgical removal of all the nails. Even this may still require 6 to 9 months of additional oral medications. See your caregiver as directed. Remember there will be no visible improvement for at least 6 months. See your caregiver sooner if other signs of infection (redness and swelling) develop. Document Released: 04/03/2000 Document Re-Released: 09/24/2009 [facility] Patient Information 2011 [place].

## 2010-12-04 NOTE — Assessment & Plan Note (Signed)
KOH negative, fungal culture neg.  Mild, patient had previous stated she wanted oral therapy, but today states she feels it is mild and does not want to take more medication.  Advised on OTC topical preparations, discussed need for daily tx one year or difficulty in treating to cure.

## 2010-12-04 NOTE — Progress Notes (Signed)
  Subjective:    Patient ID: Hannah [name], female    DOB: 1945-08-09, 65 y.o.   MRN: [id number]  HPI Here for several months of right great toe swelling.  Was seen one month ago by myself, placed her on a course of doxycycline, at that time she declined intervention to help her remove spicule of nail that was ingrown.  Noted much improvement.  No longer with pus drainage, but very tender.  She has not been able to remove spicule of nail herself.  Notes she has been under a great deal of stress with her husband being ill and requiring hospitalization. Review of Systems    no fever, chills, nausea, vomiting. Objective:   Physical Exam GEN: NAD, alert and oriented. Foot:  2+ pedal DP, PT pulses. Right great toe with erythema and tenderness at outside corner, ingrown nail identified.   Anesthetized briefly  with ethyl chloride spray, clipped nail spicule below the level of skin.  Bilateral great toes slightly yellowed and thickened.  Distal end of nail friable. Substance under nail and clipping collected for KOH and culture.      Assessment & Plan:

## 2010-12-04 NOTE — Assessment & Plan Note (Signed)
Clipped today.  Advised dressing with antibiotic ointment.  Discussed proper nail care to prevent recurrence.

## 2010-12-06 LAB — KOH PREP: RESULT - KOH: NONE SEEN

## 2010-12-09 ENCOUNTER — Ambulatory Visit: Payer: Medicare Other | Admitting: Family Medicine

## 2011-01-05 ENCOUNTER — Ambulatory Visit
Admission: RE | Admit: 2011-01-05 | Discharge: 2011-01-05 | Disposition: A | Discharge status: Home / Self Care | Payer: Medicare Other | Source: Ambulatory Visit | Attending: Family Medicine | Admitting: Family Medicine

## 2011-01-05 DIAGNOSIS — Z1231 Encounter for screening mammogram for malignant neoplasm of breast: Secondary | ICD-10-CM

## 2011-01-06 ENCOUNTER — Encounter: Payer: Self-pay | Admitting: Family Medicine

## 2011-01-21 ENCOUNTER — Ambulatory Visit (INDEPENDENT_AMBULATORY_CARE_PROVIDER_SITE_OTHER): Payer: Medicare Other | Admitting: *Deleted

## 2011-01-21 DIAGNOSIS — Z23 Encounter for immunization: Secondary | ICD-10-CM

## 2011-02-17 NOTE — Progress Notes (Signed)
Addended by: Katherina Mires on: 02/17/2011 02:55 PM   Modules accepted: Level of Service

## 2011-03-25 ENCOUNTER — Other Ambulatory Visit: Payer: Self-pay | Admitting: Family Medicine

## 2011-03-25 NOTE — Telephone Encounter (Signed)
Refill request

## 2011-03-25 NOTE — Telephone Encounter (Signed)
Please call this in and let her know she needs to make an appointment to see me Thanks [place]

## 2011-03-25 NOTE — Telephone Encounter (Signed)
[facility] of [facility] on [address] to refill med.  [facility].  Will check her schedule and give [place] a call back for appt. [place], [name]

## 2011-04-01 ENCOUNTER — Ambulatory Visit (INDEPENDENT_AMBULATORY_CARE_PROVIDER_SITE_OTHER): Payer: Medicare Other | Admitting: Family Medicine

## 2011-04-01 ENCOUNTER — Encounter: Payer: Self-pay | Admitting: Family Medicine

## 2011-04-01 VITALS — BP 109/71 | HR 89 | Temp 97.9°F | Ht 61.0 in | Wt 170.8 lb

## 2011-04-01 DIAGNOSIS — J111 Influenza due to unidentified influenza virus with other respiratory manifestations: Secondary | ICD-10-CM | POA: Insufficient documentation

## 2011-04-01 MED ORDER — OSELTAMIVIR PHOSPHATE 75 MG PO CAPS: 75.0000 mg | ORAL_CAPSULE | Freq: Two times a day (BID) | ORAL | Status: AC

## 2011-04-01 MED ORDER — ALBUTEROL 90 MCG/ACT IN AERS: 2.0000 | INHALATION_SPRAY | Freq: Four times a day (QID) | RESPIRATORY_TRACT | Status: DC | PRN

## 2011-04-01 NOTE — Patient Instructions (Signed)
Take all the tamiflu until gone Use tylenol or ibuprofen for fever and aches and plenty of fluid  Use the albuterol as needed for cough and wheeze

## 2011-04-01 NOTE — Progress Notes (Signed)
  Subjective:    Patient ID: Hannah [name], female    DOB: 1945-09-09, 65 y.o.   MRN: [id number]  HPI  Flu 2 days of achy muscles. Headache, dry cough with fever to 101 yesterday.  2 episodes of diarrhea.  No productive cough or rash or vomiting.   Mild shortness of breath with wheeze.  Use her dtrs allbuterol which helped.    Review of Symptoms - see HPI  PMH - Smoking status noted.   No high blood sugar    Review of Systems     Objective:   Physical Exam Alert no acute distress Lungs:  Normal respiratory effort, chest expands symmetrically. Lungs have mild exp wheeze, no focal crackles Heart - Regular rate and rhythm.  No murmurs, gallops or rubs.    Abdomen: soft and non-tender without masses, organomegaly or hernias noted.  No guarding or rebound Skin:  Intact without suspicious lesions or rashes Neck:  No deformities, thyromegaly, masses, or tenderness noted.   Supple with full range of motion without pain.        Assessment & Plan:

## 2011-04-01 NOTE — Assessment & Plan Note (Signed)
Consistent with influenza despite flu immunization.  Given comorbidities will treat with tamiflu.  No signs of focal bacterial infection

## 2011-04-03 ENCOUNTER — Ambulatory Visit: Payer: Medicare Other | Admitting: Family Medicine

## 2011-04-03 ENCOUNTER — Ambulatory Visit (INDEPENDENT_AMBULATORY_CARE_PROVIDER_SITE_OTHER): Payer: Medicare Other | Admitting: Family Medicine

## 2011-04-03 ENCOUNTER — Encounter: Payer: Self-pay | Admitting: Family Medicine

## 2011-04-03 VITALS — BP 148/75 | HR 78 | Temp 98.3°F | Resp 12 | Ht 61.0 in | Wt 170.0 lb

## 2011-04-03 DIAGNOSIS — J111 Influenza due to unidentified influenza virus with other respiratory manifestations: Secondary | ICD-10-CM

## 2011-04-03 DIAGNOSIS — R059 Cough, unspecified: Secondary | ICD-10-CM

## 2011-04-03 DIAGNOSIS — R05 Cough: Secondary | ICD-10-CM

## 2011-04-03 MED ORDER — PREDNISONE 10 MG PO TABS: 20.0000 mg | ORAL_TABLET | Freq: Every day | ORAL | Status: DC

## 2011-04-03 MED ORDER — GUAIFENESIN ER 600 MG PO TB12: 600.0000 mg | ORAL_TABLET | Freq: Two times a day (BID) | ORAL | Status: DC

## 2011-04-03 NOTE — Patient Instructions (Signed)
It was a pleasure to see you today.  I believe your symptoms are related to the flu that was diagnosed by Dr Erin Hearing a few days ago.   I am adding Prednisone 20mg  daily, to take with something to eat for the next 5 days.  This should help with your cough significantly.   I also recommend guiafenesin 600mg  every 12 hours to thin mucus and help with your cough.  Plenty of fluids.  If you are worsening, an order for Chest xray was placed at [place]. Please call our on-call doctor if you are worsening.

## 2011-04-03 NOTE — Assessment & Plan Note (Signed)
Nothing to raise suspicion for secondary infection such as pneumonia. I believe the patient is following and expected course for influenza. We discussed supportive therapy and I will also add low-dose prednisone for 5 days.  We discussed that I do not believe she needs a chest x-ray at this time. If she feels she is worsening over this coming weekend she may present for a chest x-ray at [place] and call our practice to ask that the doctor on-call review the results of that x-ray before making further recommendations. It is my expectation that she will not need the x-ray. We discussed signs and symptoms to prompt followup.

## 2011-04-03 NOTE — Progress Notes (Signed)
  Subjective:    Patient ID: Hannah [name], female    DOB: [birth year], 65 y.o.   MRN: [id number]  HPI Patient presents for an acute visit today for worsening cough and continuing to feel myalgias. She was seen here 2 days ago by her primary physician Dr Erin Hearing and started on Tamiflu. She is halfway through the course and reports feeling miserable. She has an unrelenting dry cough that has kept her up at night since that visit. She has some shortness of breath on occasion with the cough. She has used the albuterol inhaler with mild improvement for short periods of time. She did receive the flu vaccine this season.    Review of Systems she reports chest pain with heavy coughing. Mild shortness of breath with exertion.     Objective:   Physical Exam Mildly ill appearing woman in no acute distress speech is fluid. HEENT: Injected conjunctivae. Tympanic membranes are without erythema. No frontal or maxillary sinus tenderness. Watery rhinorrhea is noted. Oropharynx is clear neck is supple with no cervical adenopathy noted the Heart: Regular S1-S2 without extra sounds Lungs: Paroxysms of coughing with deep breathing. No rales or wheezes are heard on exam       Assessment & Plan:

## 2011-04-07 ENCOUNTER — Other Ambulatory Visit: Payer: Self-pay | Admitting: Family Medicine

## 2011-04-07 NOTE — Telephone Encounter (Signed)
Refill request

## 2011-05-06 ENCOUNTER — Other Ambulatory Visit: Payer: Self-pay | Admitting: Family Medicine

## 2011-05-06 NOTE — Telephone Encounter (Signed)
Refill request

## 2011-05-08 ENCOUNTER — Other Ambulatory Visit: Payer: Self-pay | Admitting: Family Medicine

## 2011-05-08 NOTE — Telephone Encounter (Signed)
Rx called in and pt informed.  Said she will make an appt as soon as she gets [place] situated with his PT, she says she just cant leave him by himself right now. Hannah [name], [name]

## 2011-05-08 NOTE — Telephone Encounter (Signed)
Refill request

## 2011-06-03 ENCOUNTER — Encounter: Payer: Self-pay | Admitting: Family Medicine

## 2011-06-03 ENCOUNTER — Ambulatory Visit (INDEPENDENT_AMBULATORY_CARE_PROVIDER_SITE_OTHER): Payer: Medicare Other | Admitting: Family Medicine

## 2011-06-03 VITALS — BP 130/70 | HR 74 | Temp 98.7°F | Ht 61.0 in | Wt 175.0 lb

## 2011-06-03 DIAGNOSIS — E669 Obesity, unspecified: Secondary | ICD-10-CM

## 2011-06-03 DIAGNOSIS — Z23 Encounter for immunization: Secondary | ICD-10-CM

## 2011-06-03 DIAGNOSIS — I10 Essential (primary) hypertension: Secondary | ICD-10-CM

## 2011-06-03 DIAGNOSIS — F411 Generalized anxiety disorder: Secondary | ICD-10-CM

## 2011-06-03 LAB — COMPREHENSIVE METABOLIC PANEL
ALT: 14 U/L (ref 0–35)
Albumin: 4.5 g/dL (ref 3.5–5.2)
CO2: 22 mEq/L (ref 19–32)
Calcium: 9.8 mg/dL (ref 8.4–10.5)
Chloride: 99 mEq/L (ref 96–112)
Glucose, Bld: 116 mg/dL — ABNORMAL HIGH (ref 70–99)
Potassium: 4.4 mEq/L (ref 3.5–5.3)
Sodium: 135 mEq/L (ref 135–145)
Total Protein: 7.4 g/dL (ref 6.0–8.3)

## 2011-06-03 LAB — CBC
Hemoglobin: 12 g/dL (ref 12.0–15.0)
MCH: 29.9 pg (ref 26.0–34.0)
MCV: 90.5 fL (ref 78.0–100.0)
Platelets: 284 10*3/uL (ref 150–400)
RBC: 4.02 MIL/uL (ref 3.87–5.11)
WBC: 5.2 10*3/uL (ref 4.0–10.5)

## 2011-06-03 MED ORDER — ALPRAZOLAM 0.25 MG PO TABS: 0.2500 mg | ORAL_TABLET | Freq: Two times a day (BID) | ORAL | Status: DC

## 2011-06-03 NOTE — Patient Instructions (Addendum)
Schedule your colonscopy  I will call you if your tests are not good.  Otherwise I will send you a letter.  If you do not hear from me with in 2 weeks please call our office.     [facility] your health goal

## 2011-06-03 NOTE — Assessment & Plan Note (Signed)
Worsened.  [facility]

## 2011-06-03 NOTE — Assessment & Plan Note (Signed)
Stable and using Xanax appropritately will continue

## 2011-06-03 NOTE — Progress Notes (Signed)
Addended by: Christen Bame D on: 06/03/2011 12:20 PM   Modules accepted: Orders

## 2011-06-03 NOTE — Progress Notes (Signed)
  Subjective:    Patient ID: Hannah [name], female    DOB: [birth year], 66 y.o.   MRN: [id number]  HPI  Anxiety Fairly well controlled on xanax twice daily.  Is stressed with her husbands illness but able to cope and hopes to start execising again.  No depressive thougths and her appetite is good  HYPERTENSION Disease Monitoring Home BP Monitoring not doing Chest pain- no     Dyspnea-  no  Medications Compliance: taking as prescribed. Lightheadedness-  no  Edema-  no   ROS - See HPI  PMH Lab Review   Potassium  Date Value Range Status  03/26/2010 4.1  3.5-5.3 (meq/L) Final     Sodium  Date Value Range Status  03/26/2010 141  135-145 (meq/L) Final       Obesity Not execising because busy with her husbands illness.  [facility]  Trying to eat healthy    Review of Symptoms - see HPI  PMH - Smoking status noted.     Review of Systems     Objective:   Physical Exam  Heart - Regular rate and rhythm.  No murmurs, gallops or rubs.    Lungs:  Normal respiratory effort, chest expands symmetrically. Lungs are clear to auscultation, no crackles or wheezes. Extremities:  No cyanosis, edema, or deformity noted with good range of motion of all major joints.         Assessment & Plan:

## 2011-06-03 NOTE — Assessment & Plan Note (Signed)
Well controlled continue current medications check labs

## 2011-06-04 ENCOUNTER — Telehealth: Payer: Self-pay | Admitting: Family Medicine

## 2011-06-04 NOTE — Telephone Encounter (Signed)
Patient is calling to find out what the name of the shingles shot is for insurance purposes, that was suggested by Dr. Erin Hearing for her and her husband.

## 2011-06-04 NOTE — Telephone Encounter (Signed)
Pt informed of zostavax. Stpehen Petitjean, [name]

## 2011-06-05 ENCOUNTER — Encounter: Payer: Self-pay | Admitting: Family Medicine

## 2011-06-08 ENCOUNTER — Encounter: Payer: Self-pay | Admitting: Family Medicine

## 2011-06-08 ENCOUNTER — Ambulatory Visit (INDEPENDENT_AMBULATORY_CARE_PROVIDER_SITE_OTHER): Payer: Medicare Other | Admitting: Family Medicine

## 2011-06-08 VITALS — BP 176/74 | HR 82 | Temp 98.1°F | Ht 61.0 in | Wt 178.0 lb

## 2011-06-08 DIAGNOSIS — N390 Urinary tract infection, site not specified: Secondary | ICD-10-CM | POA: Insufficient documentation

## 2011-06-08 DIAGNOSIS — R300 Dysuria: Secondary | ICD-10-CM

## 2011-06-08 LAB — POCT UA - MICROSCOPIC ONLY

## 2011-06-08 LAB — POCT WET PREP (WET MOUNT)

## 2011-06-08 LAB — POCT URINALYSIS DIPSTICK
Bilirubin, UA: NEGATIVE
Glucose, UA: NEGATIVE
Ketones, UA: NEGATIVE
Nitrite, UA: NEGATIVE
pH, UA: 6

## 2011-06-08 MED ORDER — CEPHALEXIN 500 MG PO CAPS: 500.0000 mg | ORAL_CAPSULE | Freq: Four times a day (QID) | ORAL | Status: AC

## 2011-06-08 NOTE — Progress Notes (Signed)
  Subjective:    Patient ID: Hannah [name], female    DOB: Feb 11, 1946, 66 y.o.   MRN: [id number]  HPI Patient with 2 days of dysuria, pressure, increased frequency of urination. She states that she saw some blood on the paper when she wiped today. She says that she has been irritated and been itching. She is not sexually active. Before this week she has not been having any vaginal irritation. She is only seen blood when she wiped one other time and that was with UTI. Denies fevers, chills  Review of Systems See above    Objective:   Physical Exam Vital signs reviewed General appearance - alert, well appearing, and in no distress and oriented to person, place, and time GYN- external genetalia without lesions but with some redness.  Vagina normal color, rugations, thin white discharge.        Assessment & Plan:

## 2011-06-08 NOTE — Patient Instructions (Signed)
You have a urinary tract infection. I have sent an antibiotic for you. If you're feeling better but you still have bleeding, please give [place] a call back.

## 2011-06-08 NOTE — Assessment & Plan Note (Signed)
Urinary tract infection with hematuria. Treat with Keflex and sent for urinary culture. Patient advised to return if continues to have bleeding past this urinary tract infection.

## 2011-06-10 LAB — URINE CULTURE: Colony Count: >=100000

## 2011-07-06 ENCOUNTER — Other Ambulatory Visit: Payer: Self-pay | Admitting: Family Medicine

## 2011-07-07 NOTE — Telephone Encounter (Signed)
Refill request

## 2011-12-02 ENCOUNTER — Other Ambulatory Visit: Payer: Self-pay | Admitting: Family Medicine

## 2011-12-02 NOTE — Telephone Encounter (Signed)
Please call in Thanks [facility]

## 2011-12-02 NOTE — Telephone Encounter (Signed)
Rx called in 

## 2012-02-01 ENCOUNTER — Encounter: Payer: Self-pay | Admitting: Family Medicine

## 2012-02-01 ENCOUNTER — Ambulatory Visit (INDEPENDENT_AMBULATORY_CARE_PROVIDER_SITE_OTHER): Payer: Medicare Other | Admitting: Family Medicine

## 2012-02-01 VITALS — BP 136/75 | HR 89 | Ht 62.0 in | Wt 180.0 lb

## 2012-02-01 DIAGNOSIS — K219 Gastro-esophageal reflux disease without esophagitis: Secondary | ICD-10-CM

## 2012-02-01 DIAGNOSIS — F411 Generalized anxiety disorder: Secondary | ICD-10-CM

## 2012-02-01 DIAGNOSIS — Z23 Encounter for immunization: Secondary | ICD-10-CM

## 2012-02-01 DIAGNOSIS — I10 Essential (primary) hypertension: Secondary | ICD-10-CM

## 2012-02-01 NOTE — Patient Instructions (Addendum)
You need a colonoscopy - please schedule  You need get out every day for at least 60 minutes  See me back in one month  I will call you if your lab tests are not normal.  Otherwise we will discuss them at your next visit.

## 2012-02-01 NOTE — Assessment & Plan Note (Signed)
Worsening likely due to home stressors.  Will not increase xanax but have her work on establishing a more sustainable life style wit her invalid husband

## 2012-02-01 NOTE — Progress Notes (Signed)
  Subjective:    Patient ID: Hannah [name], female    DOB: 19-Dec-1945, 66 y.o.   MRN: [id number]  HPI  Anxiety Having episodes of irritation and chest full feeling when interacting with her husband sometime.  No chest pain or shortness of breath with these episodes or with exercise,  They seem to improve with xanax.  She feels she has no time for herself and is taking care of him 24/7 (he is a dble amputee)  HYPERTENSION Disease Monitoring Home BP Monitoring not doing Chest pain- yes, no     Dyspnea-  no  Medications Compliance: taking as prescribed. Lightheadedness-  no  Edema-  no   GERD Has heartburn intermittently that is better with PPI.  No bleeding or food sticking Takes omperazole as needed   ROS - See HPI  PMH Lab Review   Potassium  Date Value Range Status  06/03/2011 4.4  3.5 - 5.3 mEq/L Final     Sodium  Date Value Range Status  06/03/2011 135  135 - 145 mEq/L Final           Review of Systems     Objective:   Physical Exam Heart - Regular rate and rhythm.  No murmurs, gallops or rubs.    Lungs:  Normal respiratory effort, chest expands symmetrically. Lungs are clear to auscultation, no crackles or wheezes. Extremities:  No cyanosis, edema, or deformity noted with good range of motion of all major joints.          Assessment & Plan:

## 2012-02-01 NOTE — Assessment & Plan Note (Signed)
Stable continue as needed PPI

## 2012-02-01 NOTE — Assessment & Plan Note (Signed)
Well controlled.  Check labs  

## 2012-02-02 LAB — COMPREHENSIVE METABOLIC PANEL
ALT: 12 U/L (ref 0–35)
AST: 16 U/L (ref 0–37)
Alkaline Phosphatase: 90 U/L (ref 39–117)
Creat: 1.1 mg/dL (ref 0.50–1.10)
Sodium: 139 mEq/L (ref 135–145)
Total Bilirubin: 0.4 mg/dL (ref 0.3–1.2)
Total Protein: 6.7 g/dL (ref 6.0–8.3)

## 2012-02-27 ENCOUNTER — Other Ambulatory Visit: Payer: Self-pay | Admitting: Family Medicine

## 2012-02-29 NOTE — Telephone Encounter (Signed)
Please call in xanax Rx Thanks  [place]

## 2012-02-29 NOTE — Telephone Encounter (Signed)
rx called in . Edwin Baines, [name]

## 2012-03-29 ENCOUNTER — Other Ambulatory Visit: Payer: Self-pay | Admitting: Family Medicine

## 2012-03-29 MED ORDER — OMEPRAZOLE 20 MG PO CPDR: 20.0000 mg | DELAYED_RELEASE_CAPSULE | Freq: Every day | ORAL | Status: DC | PRN

## 2012-03-31 ENCOUNTER — Other Ambulatory Visit: Payer: Self-pay | Admitting: Family Medicine

## 2012-05-23 ENCOUNTER — Telehealth: Payer: Self-pay | Admitting: Family Medicine

## 2012-05-23 NOTE — Telephone Encounter (Signed)
Patient is calling because of a problem with her Omeprazole.  She said she spoke to Dr. Erin Hearing about this back in October and he was going to fix it.  Apparently, the way it has been prescribed for 90 days, but then some time during the time that she was first prescribed, the # of refills got messed up and this is affecting the cost.  She is supposed to only be paying $4 for 3 month supply, but she has to pay $12 and she needs this corrected.

## 2012-05-24 NOTE — Telephone Encounter (Signed)
I sent in the below Rx.  Please check with her and see what she needs?   Thanks LC    omeprazole (PRILOSEC) 20 MG capsule [id number]  Order Details    Dose: 20 mg  Route: Oral  Frequency: Daily PRN    Dispense Quantity: 90 capsule  Refills: 3  Fills Remaining: 3             Sig: Take 1 capsule (20 mg total) by mouth daily as needed.           Written Date: 03/29/12  Expiration Date: 03/29/13       Start Date: 03/29/12  End Date: --       Prescribed by: --  Authorized by: [name], MD  Ordering User: [name], MD                   Original Order: omeprazole (PRILOSEC) 20 MG capsule [id number]       Pharmacy: [facility] [place], [place]

## 2012-05-25 NOTE — Telephone Encounter (Signed)
LMOVM asking pt to return call.  Omeprazole was sent in with a #90 day supply on 03/29/2012 with 3 refills.  This should last her for 9 months.  Please ask what she needs [place] to do per pharmacy?Clinton Sawyer, [name]

## 2012-06-28 ENCOUNTER — Other Ambulatory Visit: Payer: Self-pay | Admitting: Family Medicine

## 2012-08-23 ENCOUNTER — Other Ambulatory Visit: Payer: Self-pay | Admitting: Family Medicine

## 2012-08-25 NOTE — Telephone Encounter (Signed)
Pt is aware that rx was called into pharmacy and she will call in June to make appt.

## 2012-08-25 NOTE — Telephone Encounter (Signed)
Please call in Rx and notify she needs an appointment in June iwht her husband  Thanks  [place]

## 2012-09-07 ENCOUNTER — Encounter: Payer: Self-pay | Admitting: Home Health Services

## 2012-09-22 ENCOUNTER — Other Ambulatory Visit: Payer: Self-pay | Admitting: Family Medicine

## 2012-09-22 NOTE — Telephone Encounter (Signed)
Please call in refill for xanax Thanks [place]

## 2012-09-22 NOTE — Telephone Encounter (Signed)
Medication refilled.  Hannah [name], Hannah [name], Hannah [name]

## 2012-10-07 ENCOUNTER — Encounter: Payer: Self-pay | Admitting: Family Medicine

## 2012-10-10 ENCOUNTER — Encounter: Payer: Self-pay | Admitting: Family Medicine

## 2012-10-10 ENCOUNTER — Ambulatory Visit (INDEPENDENT_AMBULATORY_CARE_PROVIDER_SITE_OTHER): Payer: Medicare Other | Admitting: Family Medicine

## 2012-10-10 VITALS — BP 140/60 | HR 85 | Temp 98.6°F | Ht 62.0 in | Wt 180.0 lb

## 2012-10-10 DIAGNOSIS — F411 Generalized anxiety disorder: Secondary | ICD-10-CM

## 2012-10-10 DIAGNOSIS — K219 Gastro-esophageal reflux disease without esophagitis: Secondary | ICD-10-CM

## 2012-10-10 DIAGNOSIS — I10 Essential (primary) hypertension: Secondary | ICD-10-CM

## 2012-10-10 MED ORDER — ALPRAZOLAM 0.25 MG PO TABS: 0.2500 mg | ORAL_TABLET | Freq: Two times a day (BID) | ORAL | Status: DC | PRN

## 2012-10-10 MED ORDER — OMEPRAZOLE 20 MG PO CPDR: 20.0000 mg | DELAYED_RELEASE_CAPSULE | Freq: Every day | ORAL | Status: DC | PRN

## 2012-10-10 MED ORDER — CARVEDILOL 12.5 MG PO TABS: 12.5000 mg | ORAL_TABLET | Freq: Two times a day (BID) | ORAL | Status: DC

## 2012-10-10 MED ORDER — LISINOPRIL-HYDROCHLOROTHIAZIDE 20-25 MG PO TABS: 1.0000 | ORAL_TABLET | Freq: Every day | ORAL | Status: DC

## 2012-10-10 NOTE — Patient Instructions (Addendum)
Ask Dr Collene Mares to send me a report of your colonoscopy  Check your blood pressure regularly - if it is usually > 150/90 then please come in  Come back in 6 mo for refills and blood check  Aim to lose about 2 lbs per week   Search for Serving Size Plate for guide to amounts of food

## 2012-10-11 NOTE — Assessment & Plan Note (Signed)
Stable.  Discussed risks of increasing xanax.  She will continue to work with deep breathing exercises

## 2012-10-11 NOTE — Progress Notes (Signed)
  Subjective:    Patient ID: Hannah [name], female    DOB: May 31, 1945, 67 y.o.   MRN: [id number]  HPI Anxiety Intermittently has episodes where feels very anxious and overwhelmed.  Often around her husbands care or illness.  Would like to increase the dose of her xanax which helps but not enough.  She also does deep breathing when she is feeling stressed which seems to help.  Does not greatly interfere with her functioning.  No depressive symptoms or evidence of suicidal ideation   HYPERTENSION Disease Monitoring Home BP Monitoring 140s/60s Chest pain- no    Dyspnea- no Medications Compliance-  Daily . Lightheadedness-  no  Edema- no ROS - See HPI  GERD Disease Monitoring Typical Symptoms:  Heart burning    Bleeding (hematemesis/melena):  no  Food sticking:  no  Weight Loss:  no  Medication Monitoring Compliance:  Uses omerapzole as needed several times a week   Recent Pneumonia  no        PMH Lab Review   Potassium  Date Value Range Status  02/01/2012 5.1  3.5 - 5.3 mEq/L Final     Sodium  Date Value Range Status  02/01/2012 139  135 - 145 mEq/L Final     Creat  Date Value Range Status  02/01/2012 1.10  0.50 - 1.10 mg/dL Final     Creatinine, Ser  Date Value Range Status  03/26/2010 1.18  0.40-1.20 mg/dL Final        Review of Symptoms - see HPI  PMH - Smoking status noted.       Review of Systems     Objective:   Physical Exam   Heart - Regular rate and rhythm.  No murmurs, gallops or rubs.    Lungs:  Normal respiratory effort, chest expands symmetrically. Lungs are clear to auscultation, no crackles or wheezes. Extremities:  No cyanosis, edema, or deformity noted with good range of motion of all major joints.        Assessment & Plan:

## 2012-10-11 NOTE — Assessment & Plan Note (Signed)
Seems adequately controlled by home readings and repeat here.  Recent labs ok.  Will continue current medications

## 2012-10-11 NOTE — Assessment & Plan Note (Signed)
Stable No red flags - continue as needed use of PPI

## 2012-10-25 ENCOUNTER — Other Ambulatory Visit: Payer: Self-pay

## 2012-10-25 DIAGNOSIS — Z1231 Encounter for screening mammogram for malignant neoplasm of breast: Secondary | ICD-10-CM

## 2012-11-17 ENCOUNTER — Ambulatory Visit
Admission: RE | Admit: 2012-11-17 | Discharge: 2012-11-17 | Disposition: A | Discharge status: Home / Self Care | Payer: Medicare Other | Source: Ambulatory Visit

## 2012-11-17 DIAGNOSIS — Z1231 Encounter for screening mammogram for malignant neoplasm of breast: Secondary | ICD-10-CM

## 2013-04-20 ENCOUNTER — Other Ambulatory Visit: Payer: Self-pay | Admitting: Family Medicine

## 2013-04-24 NOTE — Telephone Encounter (Signed)
Please call in rx  thanks

## 2013-04-24 NOTE — Telephone Encounter (Signed)
Pt is aware that rx was called in. [facility]

## 2013-05-18 ENCOUNTER — Other Ambulatory Visit: Payer: Self-pay | Admitting: Family Medicine

## 2013-05-18 ENCOUNTER — Telehealth: Payer: Self-pay | Admitting: Family Medicine

## 2013-05-18 NOTE — Telephone Encounter (Signed)
Rx called in, patient informed to schedule an appointment before further refills, expressed understanding.

## 2013-05-18 NOTE — Telephone Encounter (Signed)
Please call in Xanax Rx written today and let patient know she needs an office visit before more refills  Thanks  [place]

## 2013-06-15 ENCOUNTER — Other Ambulatory Visit: Payer: Self-pay | Admitting: Family Medicine

## 2013-06-16 ENCOUNTER — Telehealth: Payer: Self-pay | Admitting: Family Medicine

## 2013-06-16 ENCOUNTER — Encounter: Payer: Self-pay | Admitting: Family Medicine

## 2013-06-16 MED ORDER — ALPRAZOLAM 0.25 MG PO TABS: 0.2500 mg | ORAL_TABLET | Freq: Two times a day (BID) | ORAL | Status: DC | PRN

## 2013-06-16 NOTE — Telephone Encounter (Signed)
Rx changed and pt informed. Hannah [name], [name]

## 2013-06-16 NOTE — Telephone Encounter (Signed)
Please call in an Rx for another 30 tabs  And I will see her on 3-18  Thanks  [place]

## 2013-06-16 NOTE — Telephone Encounter (Signed)
Please call in order for Xanax for 15 tabs  Please let patient know she needs to make an appointment to see me before I can give more refils   Thanks

## 2013-06-16 NOTE — Telephone Encounter (Signed)
Rx called in, pt aware.  Per Jazmin, pt states that she will be out of town the next two weeks and will need more than 15 pills.  Will forward to MD. Clinton Sawyer, [name]

## 2013-07-05 ENCOUNTER — Encounter: Payer: Medicare Other | Admitting: Family Medicine

## 2013-07-07 NOTE — Progress Notes (Signed)
This encounter was created in error - please disregard.

## 2013-07-14 ENCOUNTER — Other Ambulatory Visit: Payer: Self-pay | Admitting: Family Medicine

## 2013-09-06 ENCOUNTER — Encounter: Payer: Self-pay | Admitting: Family Medicine

## 2013-09-06 ENCOUNTER — Ambulatory Visit (INDEPENDENT_AMBULATORY_CARE_PROVIDER_SITE_OTHER): Payer: Medicare Other | Admitting: Family Medicine

## 2013-09-06 VITALS — BP 143/80 | HR 82 | Temp 98.0°F | Wt 182.0 lb

## 2013-09-06 DIAGNOSIS — I10 Essential (primary) hypertension: Secondary | ICD-10-CM

## 2013-09-06 DIAGNOSIS — F411 Generalized anxiety disorder: Secondary | ICD-10-CM

## 2013-09-06 DIAGNOSIS — Z0000 Encounter for general adult medical examination without abnormal findings: Secondary | ICD-10-CM

## 2013-09-06 DIAGNOSIS — Z13220 Encounter for screening for lipoid disorders: Secondary | ICD-10-CM | POA: Insufficient documentation

## 2013-09-06 LAB — LIPID PANEL
CHOLESTEROL: 254 mg/dL — AB (ref 0–200)
HDL: 48 mg/dL (ref >39)
LDL CALC: 177 mg/dL — AB (ref 0–99)
TRIGLYCERIDES: 145 mg/dL (ref <150)
Total CHOL/HDL Ratio: 5.3 Ratio
VLDL: 29 mg/dL (ref 0–40)

## 2013-09-06 LAB — COMPREHENSIVE METABOLIC PANEL
ALBUMIN: 4.6 g/dL (ref 3.5–5.2)
ALK PHOS: 95 U/L (ref 39–117)
ALT: 12 U/L (ref 0–35)
AST: 15 U/L (ref 0–37)
BILIRUBIN TOTAL: 0.5 mg/dL (ref 0.2–1.2)
BUN: 28 mg/dL — AB (ref 6–23)
CO2: 26 meq/L (ref 19–32)
Calcium: 9.6 mg/dL (ref 8.4–10.5)
Chloride: 100 mEq/L (ref 96–112)
Creat: 1.19 mg/dL — ABNORMAL HIGH (ref 0.50–1.10)
GLUCOSE: 119 mg/dL — AB (ref 70–99)
Potassium: 4.7 mEq/L (ref 3.5–5.3)
Sodium: 136 mEq/L (ref 135–145)
Total Protein: 7.4 g/dL (ref 6.0–8.3)

## 2013-09-06 MED ORDER — LISINOPRIL-HYDROCHLOROTHIAZIDE 20-25 MG PO TABS: 1.0000 | ORAL_TABLET | Freq: Every day | ORAL | Status: DC

## 2013-09-06 MED ORDER — CARVEDILOL 12.5 MG PO TABS: 12.5000 mg | ORAL_TABLET | Freq: Two times a day (BID) | ORAL | Status: DC

## 2013-09-06 MED ORDER — OMEPRAZOLE 20 MG PO CPDR: 20.0000 mg | DELAYED_RELEASE_CAPSULE | Freq: Every day | ORAL | Status: DC | PRN

## 2013-09-06 MED ORDER — ALPRAZOLAM 0.25 MG PO TABS
0.2500 mg | ORAL_TABLET | Freq: Two times a day (BID) | ORAL | Status: DC | PRN
Start: 2013-09-06 — End: 2014-03-01

## 2013-09-06 NOTE — Patient Instructions (Signed)
Good to see you today!  Thanks for coming in.  I will call you if your tests are not good.  Otherwise I will send you a letter.  If you do not hear from me with in 2 weeks please call our office.     Consider colonscopy and shingles shot  Work on exercise and food control  Come back in 6 months

## 2013-09-06 NOTE — Progress Notes (Signed)
Patient here for annual wellness visit, patient reports: Risk Factors/Conditions needing evaluation or treatment: Pt. Does not have any risk factors that need evaluation.  Home Safety: Pt lives at home, husband in a 1 story home.  Pt reports having smoke alarms and having adaptive equipment.  Other Information: Corrective lens: Pt has reading corrective lens, has annual eye exams. Dentures: Pt does not have dentures, has annual dental exams. Memory: Pt does not memory problems.  Patient's Mini Mental Score (recorded in doc. flowsheet): 30 Med Adherence:  We discussed importance of taking medications for chronic conditions.  Pt reports missing 0 day in the past week.  ADL/IADL:  Pt reports independence in all functions. Balance/Gait: Pt reports 0 falls in the past year.  We discussed home safety and fall prevention.       Annual Wellness Visit Requirements Recorded Today In  Medical, family, social history Past Medical, Family, Social History Section  Current providers Care team  Current medications Medications  Wt, BP, Ht, BMI Vital signs        Tobacco, alcohol, illicit drug use History  ADL Nurse Assessment  Depression Screening Nurse Assessment  Cognitive impairment Nurse Assessment  Mini Mental Status Document Flowsheet  Fall Risk Fall/Depression  Home Safety Progress Note  End of Life Planning (welcome visit) Social Documentation  Medicare preventative services Progress Note  Risk factors/conditions needing evaluation/treatment Progress Note  Personalized health advice Patient Instructions, goals, letter  Diet & Exercise Social Documentation  Emergency Contact Social Documentation  Seat Belts Social Documentation  Sun exposure/protection Social Documentation   Reviewed and [place]

## 2013-09-06 NOTE — Progress Notes (Signed)
   Subjective:    Patient ID: Hannah [name], female    DOB: 29-May-1945, 68 y.o.   MRN: [id number]  HPI  Anxiety Intermittently has episodes where feels very anxious and overwhelmed.  Has been stressed with recent death of her elderly father but feels she is coping ok.  When had to decrease her xanax due to running out she felt very bad.  Relates she feels the risks of xanax (falls confusion) defintiely worth the benefits. No depressive symptoms or evidence of suicidal ideation   HYPERTENSION Disease Monitoring Home BP Monitoring rarely checking Chest pain- no    Dyspnea- no Medications Compliance-  Daily . Lightheadedness-  no  Edema- no ROS - See HPI  Review of Symptoms - see HPI  PMH - Smoking status noted.       Review of Systems     Objective:   Physical Exam Alert no acute distress Heart - Regular rate and rhythm.  No murmurs, gallops or rubs.    Lungs:  Normal respiratory effort, chest expands symmetrically. Lungs are clear to auscultation, no crackles or wheezes. Extremities:  No cyanosis, edema, or deformity noted with good range of motion of all major joints.          Assessment & Plan:

## 2013-09-06 NOTE — Assessment & Plan Note (Signed)
Well controlled.  Check labs  

## 2013-09-06 NOTE — Assessment & Plan Note (Signed)
Stable She definitely feels she needs to continue her xanax with risks worth the benefits

## 2013-09-07 ENCOUNTER — Encounter: Payer: Self-pay | Admitting: Family Medicine

## 2013-12-06 ENCOUNTER — Other Ambulatory Visit: Payer: Self-pay | Admitting: Family Medicine

## 2014-01-02 ENCOUNTER — Encounter: Payer: Self-pay | Admitting: Home Health Services

## 2014-01-31 ENCOUNTER — Other Ambulatory Visit: Payer: Self-pay

## 2014-01-31 DIAGNOSIS — Z1239 Encounter for other screening for malignant neoplasm of breast: Secondary | ICD-10-CM

## 2014-02-13 ENCOUNTER — Encounter (INDEPENDENT_AMBULATORY_CARE_PROVIDER_SITE_OTHER): Payer: Self-pay

## 2014-02-13 ENCOUNTER — Ambulatory Visit
Admission: RE | Admit: 2014-02-13 | Discharge: 2014-02-13 | Disposition: A | Discharge status: Home / Self Care | Payer: Medicare Other | Source: Ambulatory Visit

## 2014-02-13 DIAGNOSIS — Z1239 Encounter for other screening for malignant neoplasm of breast: Secondary | ICD-10-CM

## 2014-02-21 ENCOUNTER — Ambulatory Visit: Payer: Medicare Other

## 2014-02-21 ENCOUNTER — Ambulatory Visit (INDEPENDENT_AMBULATORY_CARE_PROVIDER_SITE_OTHER): Payer: Medicare Other

## 2014-02-21 ENCOUNTER — Telehealth: Payer: Self-pay | Admitting: Family Medicine

## 2014-02-21 DIAGNOSIS — Z23 Encounter for immunization: Secondary | ICD-10-CM

## 2014-02-21 NOTE — Telephone Encounter (Signed)
Mrs. Deegan called to say that Dr. Martinique won't have any availability until February.  Please call her back with the name of another heart doctor she can call and hopefully get an earlier appt.

## 2014-02-22 NOTE — Telephone Encounter (Signed)
This is regarding her husband Hannah [name]  She should ask to see another cardiologist in his group and let them know this is for a hospital follow up for a new cardiac condition.  If they can't see him in a few weeks  she should ask them to recommend another cardiologist.  If this does not work let me know  Thanks  [place]

## 2014-02-22 NOTE — Telephone Encounter (Signed)
Pt wife informed. Hannah [name], [name]

## 2014-03-01 ENCOUNTER — Other Ambulatory Visit: Payer: Self-pay | Admitting: Family Medicine

## 2014-03-01 NOTE — Telephone Encounter (Signed)
Rx called in and pt informed. Hannah [name], [name]

## 2014-03-01 NOTE — Telephone Encounter (Signed)
Please call in alprazolam rx below  Thanks  [place]

## 2014-04-16 ENCOUNTER — Telehealth: Payer: Self-pay | Admitting: *Deleted

## 2014-04-16 NOTE — Telephone Encounter (Signed)
Pt calls and wants Dr. Erin Hearing to know that she has [employer] Savage duty the 1st week in February.  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  She would like a letter about this to send in.  She is aware that this will not be ready until next week.  She will drop off copy of summons she received in the mail next week .[place], [name]

## 2014-05-03 ENCOUNTER — Encounter: Payer: Self-pay | Admitting: Family Medicine

## 2014-06-08 ENCOUNTER — Other Ambulatory Visit: Payer: Self-pay | Admitting: Family Medicine

## 2014-06-08 ENCOUNTER — Telehealth: Payer: Self-pay | Admitting: Family Medicine

## 2014-06-08 MED ORDER — ALPRAZOLAM 0.25 MG PO TABS: 0.2500 mg | ORAL_TABLET | Freq: Two times a day (BID) | ORAL | Status: DC | PRN

## 2014-06-08 NOTE — Telephone Encounter (Signed)
Please call in Rx for xanax Thanks [place]

## 2014-06-08 NOTE — Telephone Encounter (Signed)
Pt is aware that rx was called into pharmacy. Jazmin Hartsell,CMA

## 2014-06-08 NOTE — Telephone Encounter (Signed)
Needs refill on xanax [facility] [address]

## 2014-07-10 ENCOUNTER — Telehealth: Payer: Self-pay | Admitting: Family Medicine

## 2014-07-10 NOTE — Telephone Encounter (Signed)
Called rx into [facility] [address] as directed below. Patient informed and stated she is doing ok but will callback to schedule appt soon. Jacklyne Baik, CMA.

## 2014-07-10 NOTE — Telephone Encounter (Signed)
Please call in the Rx Please call her and let her know that I would like to see her soon. Remember her husband passed away 3 weeks ago  Thanks  [place]

## 2014-07-10 NOTE — Telephone Encounter (Signed)
Needs refill on alprazolam

## 2014-07-10 NOTE — Telephone Encounter (Signed)
Refill request. Will forward to PCP for review. Hannah [name], CMA. 

## 2014-08-05 ENCOUNTER — Telehealth: Payer: Self-pay | Admitting: Family Medicine

## 2014-08-05 MED ORDER — CEPHALEXIN 500 MG PO CAPS: 500.0000 mg | ORAL_CAPSULE | Freq: Three times a day (TID) | ORAL | Status: DC

## 2014-08-05 NOTE — Telephone Encounter (Signed)
Emergency Line Call  Pt reports several days of urinary discomfort with concern for UTI. She reports increased urination; she has been drinking extra water to help "flush things out," but extra water has not helped. She has burning with urination but no frank pain, increased urge/frequency, and sensation of incomplete emptying. No fever / chills, N/V, abdominal pain, or other symptoms. Her last UTI was about 1 year ago with virtually identical symptoms. Note pt states her husband passed away not long ago and she has "not been taking great care of herself," drinking more tea than water.  Explained to pt that she does likely have a UTI and that we do not typically prescribe abx over the phone, but her symptoms are consistent with uncomplicated cystitis and do not appear to warrant an ED visit today but are severe enough that empiric abx treatment is reasonable. Rx for Keflex called in to pt's pharmacy; also recommended AZO or similar product for discomfort, in the meantime. Pt states she has a f/u appt with Dr. Erin Hearing on Wednesday, 4/20, and will call or come into clinic sooner if her symptoms are not helped or if they progress.  Emmaline Kluver, MD PGY-3, [facility] 08/05/2014, 1:09 PM

## 2014-08-08 ENCOUNTER — Ambulatory Visit (INDEPENDENT_AMBULATORY_CARE_PROVIDER_SITE_OTHER): Payer: Medicare Other | Admitting: Family Medicine

## 2014-08-08 ENCOUNTER — Encounter: Payer: Self-pay | Admitting: Family Medicine

## 2014-08-08 VITALS — BP 127/68 | HR 73 | Temp 98.6°F | Ht 62.0 in | Wt 175.0 lb

## 2014-08-08 DIAGNOSIS — I10 Essential (primary) hypertension: Secondary | ICD-10-CM | POA: Diagnosis not present

## 2014-08-08 DIAGNOSIS — K219 Gastro-esophageal reflux disease without esophagitis: Secondary | ICD-10-CM | POA: Diagnosis not present

## 2014-08-08 DIAGNOSIS — F411 Generalized anxiety disorder: Secondary | ICD-10-CM | POA: Diagnosis not present

## 2014-08-08 MED ORDER — ALPRAZOLAM 0.25 MG PO TABS: 0.2500 mg | ORAL_TABLET | Freq: Two times a day (BID) | ORAL | Status: DC | PRN

## 2014-08-08 NOTE — Assessment & Plan Note (Signed)
BP Readings from Last 3 Encounters:  08/08/14 127/68  09/06/13 143/80  10/10/12 140/60   At goal continue current medications

## 2014-08-08 NOTE — Progress Notes (Signed)
   Subjective:    Patient ID: Hannah [name], female    DOB: 06/01/1945, 69 y.o.   MRN: [id number]  HPI  Husband died 42 month ago after long illness.  She feels she is doing well  Anxiety Taking xanax usually twice daily occasional three times daily if stressed.  Feels she can easily take twice daily only.  Sleeps well.  Appetite generally good  HYPERTENSION Disease Monitoring Home BP Monitoring not chcking Chest pain- no    Dyspnea- no Medications Compliance-  Did not bring bottles but has list. Lightheadedness-  no  Edema- no ROS - See HPI  PMH Lab Review   POTASSIUM  Date Value Ref Range Status  09/06/2013 4.7 3.5 - 5.3 mEq/L Final   SODIUM  Date Value Ref Range Status  09/06/2013 136 135 - 145 mEq/L Final   CREAT  Date Value Ref Range Status  09/06/2013 1.19* 0.50 - 1.10 mg/dL Final   CREATININE, SER  Date Value Ref Range Status  03/26/2010 1.18 0.40-1.20 mg/dL Final       GERD  Disease Monitoring Typical Symptoms:  burning    Bleeding (hematemesis/melena):  no  Food sticking:  no  Weight Loss:  no   Medication Monitoring Compliance:  Tried taking as needed but usually needs every day  Chief Complaint noted Review of Symptoms - see HPI PMH - Smoking status noted.   Vital Signs reviewed    Review of Systems     Objective:   Physical Exam  Alert no acute distress Heart - Regular rate and rhythm.  No murmurs, gallops or rubs.    Lungs:  Normal respiratory effort, chest expands symmetrically. Lungs are clear to auscultation, no crackles or wheezes. Extremities:  No cyanosis, edema, or deformity noted with good range of motion of all major joints.    Psych:  Cognition and judgment appear intact. Alert, communicative  and cooperative with normal attention span and concentration. No apparent delusions, illusions, hallucinations       Assessment & Plan:

## 2014-08-08 NOTE — Assessment & Plan Note (Signed)
Stable with out red flags.  Discussed trying as needed use of ppi

## 2014-08-08 NOTE — Assessment & Plan Note (Signed)
Stable seems to be tolerating recent death of husband well.  Contiue current medications.  May try to wean xanax after 6 months

## 2014-08-08 NOTE — Patient Instructions (Signed)
Good to see you today!  Thanks for coming in.  Bring all your medication bottles next visit  Your blood pressure goal is < 140/90  Call me if any problems  Come back 3-6 months

## 2014-08-09 LAB — BASIC METABOLIC PANEL WITH GFR
BUN: 27 mg/dL — AB (ref 6–23)
CALCIUM: 9.4 mg/dL (ref 8.4–10.5)
CO2: 22 mEq/L (ref 19–32)
CREATININE: 1.29 mg/dL — AB (ref 0.50–1.10)
Chloride: 99 mEq/L (ref 96–112)
GFR, EST AFRICAN AMERICAN: 48 mL/min — AB
GFR, Est Non African American: 42 mL/min — ABNORMAL LOW
GLUCOSE: 110 mg/dL — AB (ref 70–99)
Potassium: 4.9 mEq/L (ref 3.5–5.3)
Sodium: 134 mEq/L — ABNORMAL LOW (ref 135–145)

## 2014-08-13 ENCOUNTER — Encounter: Payer: Self-pay | Admitting: Family Medicine

## 2014-08-17 ENCOUNTER — Encounter: Payer: Self-pay | Admitting: Family Medicine

## 2014-08-17 ENCOUNTER — Ambulatory Visit (INDEPENDENT_AMBULATORY_CARE_PROVIDER_SITE_OTHER): Payer: Medicare Other | Admitting: Family Medicine

## 2014-08-17 VITALS — BP 133/59 | HR 86 | Temp 98.8°F | Wt 174.9 lb

## 2014-08-17 DIAGNOSIS — N390 Urinary tract infection, site not specified: Secondary | ICD-10-CM | POA: Diagnosis not present

## 2014-08-17 DIAGNOSIS — R399 Unspecified symptoms and signs involving the genitourinary system: Secondary | ICD-10-CM

## 2014-08-17 LAB — POCT URINALYSIS DIPSTICK
Bilirubin, UA: NEGATIVE
GLUCOSE UA: 100
Ketones, UA: NEGATIVE
NITRITE UA: POSITIVE
Protein, UA: NEGATIVE
Spec Grav, UA: <=1.005
Urobilinogen, UA: 1
pH, UA: 6

## 2014-08-17 LAB — POCT UA - MICROSCOPIC ONLY

## 2014-08-17 MED ORDER — CIPROFLOXACIN HCL 500 MG PO TABS: 500.0000 mg | ORAL_TABLET | Freq: Two times a day (BID) | ORAL | Status: DC

## 2014-08-17 NOTE — Assessment & Plan Note (Signed)
S/P treatment with Keflex with no improvement. U/A showed moderate leuks and + Nitrite with blood. Urine sent to lab for culture. Empirical treatment with Ciprofloxacin. I will call her with culture result once available.

## 2014-08-17 NOTE — Progress Notes (Signed)
Subjective:     Patient ID: Hannah [name], female   DOB: 1945-08-21, 69 y.o.   MRN: [id number]  Dysuria  This is a new problem. The current episode started in the past 7 days. The problem occurs every urination. The problem has been gradually worsening. The quality of the pain is described as burning. The pain is at a severity of 4/10. The pain is mild. There has been no fever. Associated symptoms include frequency. Pertinent negatives include no discharge, hematuria, nausea, urgency or vomiting. She has tried antibiotics for the symptoms. The treatment provided mild (She was given some A/B which helped for few days and then two days ago she started burning again.) relief. Her past medical history is significant for recurrent UTIs.   Current Outpatient Prescriptions on File Prior to Visit  Medication Sig Dispense Refill  . aspirin 81 MG tablet Take 81 mg by mouth daily.      Marland Kitchen lisinopril-hydrochlorothiazide (PRINZIDE,ZESTORETIC) 20-25 MG per tablet Take 1 tablet by mouth daily. 90 tablet 3  . ALPRAZolam (XANAX) 0.25 MG tablet Take 1 tablet (0.25 mg total) by mouth 2 (two) times daily as needed. for anxiety 60 tablet 5  . carvedilol (COREG) 12.5 MG tablet Take 1 tablet (12.5 mg total) by mouth 2 (two) times daily with a meal. 180 tablet 3  . cephALEXin (KEFLEX) 500 MG capsule Take 1 capsule (500 mg total) by mouth 3 (three) times daily. (Patient not taking: Reported on 08/17/2014) 15 capsule 0  . omeprazole (PRILOSEC) 20 MG capsule Take 1 capsule (20 mg total) by mouth daily as needed. 90 capsule 3   No current facility-administered medications on file prior to visit.   Past Medical History  Diagnosis Date  . Hypertension   . Osteoarthritis      Review of Systems  Constitutional: Negative for fever and fatigue.  Respiratory: Negative.   Cardiovascular: Negative.   Gastrointestinal: Negative.  Negative for nausea and vomiting.  Genitourinary: Positive for dysuria and frequency. Negative  for urgency and hematuria.  All other systems reviewed and are negative.      Filed Vitals:   08/17/14 0936 08/17/14 0947  BP: 116/40 133/59  Pulse: 86   Temp: 98.8 F (37.1 C)   TempSrc: Oral   Weight: 174 lb 14.4 oz (79.334 kg)     Objective:   Physical Exam  Constitutional: She appears well-developed. No distress.  Cardiovascular: Normal rate, regular rhythm and normal heart sounds.   No murmur heard. Pulmonary/Chest: Effort normal and breath sounds normal. No respiratory distress. She has no wheezes.  Abdominal: Soft. Bowel sounds are normal. She exhibits no distension and no mass. There is no tenderness.  Musculoskeletal: Normal range of motion. She exhibits no edema.  Nursing note and vitals reviewed.      Assessment:     Dysuria: UTI    Plan:     Check problem list

## 2014-08-17 NOTE — Patient Instructions (Signed)

## 2014-08-19 LAB — URINE CULTURE: Colony Count: >=100000

## 2014-08-20 ENCOUNTER — Encounter: Payer: Self-pay | Admitting: Family Medicine

## 2014-08-20 ENCOUNTER — Telehealth: Payer: Self-pay | Admitting: *Deleted

## 2014-08-20 NOTE — Telephone Encounter (Signed)
-----   Message from Kinnie Feil, MD sent at 08/20/2014  6:43 AM EDT ----- Notes Recorded by Kinnie Feil, MD on 08/20/2014 at 6:43 AM Please call to inform patient she had Ecoli bacteria in her urine. The Ciprofloxacin A/B that I gave her is effective in treating her infection. Check if she is feeling better, if not have her come in for follow up with her PCP. Thank you.

## 2014-08-20 NOTE — Telephone Encounter (Signed)
Spoke with patient and she voiced understanding on message.  States that she did finish her last pill yesterday and is feeling much better. Hannah [name],CMA

## 2014-09-09 ENCOUNTER — Other Ambulatory Visit: Payer: Self-pay | Admitting: Family Medicine

## 2014-10-29 ENCOUNTER — Other Ambulatory Visit: Payer: Self-pay | Admitting: Family Medicine

## 2015-01-29 ENCOUNTER — Telehealth: Payer: Self-pay | Admitting: Family Medicine

## 2015-01-29 NOTE — Telephone Encounter (Signed)
Called spoke with her She feels she is doing pretty well  Will make an apt for follow up blood pressure etc

## 2015-02-01 ENCOUNTER — Other Ambulatory Visit: Payer: Self-pay | Admitting: Family Medicine

## 2015-02-04 NOTE — Telephone Encounter (Signed)
LM with script information on pharmacy VM. Derryl Uher,CMA

## 2015-02-04 NOTE — Telephone Encounter (Signed)
Pls call in alprazolam Rx  [place]  [place]

## 2015-02-04 NOTE — Telephone Encounter (Signed)
Received a second request from pharmacy. Jabrea Kallstrom,CMA

## 2015-02-11 ENCOUNTER — Ambulatory Visit (INDEPENDENT_AMBULATORY_CARE_PROVIDER_SITE_OTHER): Payer: Medicare Other | Admitting: *Deleted

## 2015-02-11 DIAGNOSIS — Z23 Encounter for immunization: Secondary | ICD-10-CM | POA: Diagnosis not present

## 2015-02-20 ENCOUNTER — Encounter: Payer: Self-pay | Admitting: Family Medicine

## 2015-02-20 ENCOUNTER — Ambulatory Visit (INDEPENDENT_AMBULATORY_CARE_PROVIDER_SITE_OTHER): Payer: Medicare Other | Admitting: Family Medicine

## 2015-02-20 VITALS — BP 125/58 | HR 82 | Temp 98.2°F | Ht 62.0 in | Wt 170.0 lb

## 2015-02-20 DIAGNOSIS — F411 Generalized anxiety disorder: Secondary | ICD-10-CM

## 2015-02-20 DIAGNOSIS — K219 Gastro-esophageal reflux disease without esophagitis: Secondary | ICD-10-CM

## 2015-02-20 DIAGNOSIS — I10 Essential (primary) hypertension: Secondary | ICD-10-CM | POA: Diagnosis not present

## 2015-02-20 DIAGNOSIS — Z1159 Encounter for screening for other viral diseases: Secondary | ICD-10-CM | POA: Diagnosis not present

## 2015-02-20 DIAGNOSIS — Z23 Encounter for immunization: Secondary | ICD-10-CM

## 2015-02-20 LAB — BASIC METABOLIC PANEL WITH GFR
BUN: 19 mg/dL (ref 7–25)
CO2: 27 mmol/L (ref 20–31)
Calcium: 8.8 mg/dL (ref 8.6–10.4)
Chloride: 102 mmol/L (ref 98–110)
Creat: 1.22 mg/dL — ABNORMAL HIGH (ref 0.60–0.93)
GFR, EST AFRICAN AMERICAN: 52 mL/min — AB (ref >=60)
GFR, EST NON AFRICAN AMERICAN: 45 mL/min — AB (ref >=60)
GLUCOSE: 114 mg/dL — AB (ref 65–99)
POTASSIUM: 4.6 mmol/L (ref 3.5–5.3)
Sodium: 137 mmol/L (ref 135–146)

## 2015-02-20 MED ORDER — ALPRAZOLAM 0.25 MG PO TABS: 0.2500 mg | ORAL_TABLET | Freq: Two times a day (BID) | ORAL | Status: DC | PRN

## 2015-02-20 NOTE — Assessment & Plan Note (Signed)
Stable using PPI prn

## 2015-02-20 NOTE — Assessment & Plan Note (Signed)
BP Readings from Last 3 Encounters:  02/20/15 125/58  08/17/14 133/59  08/08/14 127/68    At goal continue meds

## 2015-02-20 NOTE — Progress Notes (Signed)
   Subjective:    Patient ID: Hannah [name], female    DOB: May 12, 1945, 69 y.o.   MRN: [id number]  HPI  Feels she is doing well since husbands death in spring.  Going to gym 4 times a day  HYPERTENSION Disease Monitoring Home BP Monitoring (Severity) not checking Symptoms - Chest pain- no    Dyspnea- no Medications(Modifying factors) Compliance-  daily. Lightheadedness-  no  Edema- no Timing - continuous  Duration - years ROS - See HPI  Anxiety Feels she is doing well.  Sleeps well.  No attacks. Does feel anxious if does not take xanax although some days does not take AM dose.  No depressive symptoms.  Exercising regularly at [facility]   GERD Disease Monitoring Typical Symptoms:  Burning feeling    Bleeding (hematemesis/melena):  no  Food sticking (severity):  no  Weight Loss:  intentional   Medication Monitoring (modifying factors) Compliance:  As needed several times as week   Recent Pneumonia  no    Have attempted taking only as needed (context) yes uses as needed     PMH Lab Review   POTASSIUM  Date Value Ref Range Status  08/08/2014 4.9 3.5 - 5.3 mEq/L Final   SODIUM  Date Value Ref Range Status  08/08/2014 134* 135 - 145 mEq/L Final   CREAT  Date Value Ref Range Status  08/08/2014 1.29* 0.50 - 1.10 mg/dL Final   CREATININE, SER  Date Value Ref Range Status  03/26/2010 1.18 0.40-1.20 mg/dL Final       Chief Complaint noted Review of Symptoms - see HPI PMH - Smoking status noted.   Vital Signs reviewed     Review of Systems     Objective:   Physical Exam Alert nad Heart - Regular rate and rhythm.  No murmurs, gallops or rubs.    Lungs:  Normal respiratory effort, chest expands symmetrically. Lungs are clear to auscultation, no crackles or wheezes.        Assessment & Plan:

## 2015-02-20 NOTE — Assessment & Plan Note (Signed)
Stable.  Discussed xanax - see avs

## 2015-02-20 NOTE — Patient Instructions (Signed)
Good to see you today!  Thanks for coming in.  We will contact Dr Collene Mares about your last colonoscopy and call you if you need to do anything  Keep up the great work with exercise and weight control  Slowly cut down on the alprazolam perhaps 1/2 tab twice daily or 1 daily  I will call you if your tests are not good.  Otherwise I will send you a letter.  If you do not hear from me with in 2 weeks please call our office.     Happy Holidays

## 2015-02-21 ENCOUNTER — Encounter: Payer: Self-pay | Admitting: Family Medicine

## 2015-02-21 LAB — HEPATITIS C ANTIBODY: HCV Ab: NEGATIVE

## 2015-02-23 ENCOUNTER — Other Ambulatory Visit: Payer: Self-pay | Admitting: Family Medicine

## 2015-03-12 ENCOUNTER — Other Ambulatory Visit: Payer: Self-pay | Admitting: Family Medicine

## 2015-04-23 ENCOUNTER — Telehealth: Payer: Self-pay | Admitting: Family Medicine

## 2015-04-23 NOTE — Telephone Encounter (Signed)
I have not treated Ms Lennen for back pain nor prescribed a brace  If [facility] calls back please politely inform them if they continue to send me faxes or call asking me to prescribe equipment I do not know is needed I will report them to Medicare for suspected fraud  Thanks  [place]

## 2015-04-23 NOTE — Telephone Encounter (Signed)
[facility] Diabetic supplies: will be faxing RX for back brace. Please respond ASAP.

## 2015-04-23 NOTE — Telephone Encounter (Signed)
Will forward to MD to make him aware. Genessis Flanary,CMA  

## 2015-05-01 ENCOUNTER — Encounter: Payer: Self-pay | Admitting: Family Medicine

## 2015-05-01 ENCOUNTER — Ambulatory Visit (INDEPENDENT_AMBULATORY_CARE_PROVIDER_SITE_OTHER): Payer: Medicare Other | Admitting: Family Medicine

## 2015-05-01 VITALS — BP 137/52 | HR 87 | Temp 98.3°F | Wt 175.0 lb

## 2015-05-01 DIAGNOSIS — I10 Essential (primary) hypertension: Secondary | ICD-10-CM | POA: Diagnosis not present

## 2015-05-01 DIAGNOSIS — F411 Generalized anxiety disorder: Secondary | ICD-10-CM

## 2015-05-01 NOTE — Patient Instructions (Signed)
Good to see you today!  Thanks for coming in.  Keep doing what you are doing  If you have any symptoms during exercise or if you have incresing number of attack then call me or come in for a vist  Come back in 3-4 months

## 2015-05-02 NOTE — Assessment & Plan Note (Signed)
Stable continue current medications

## 2015-05-02 NOTE — Assessment & Plan Note (Signed)
BP Readings from Last 3 Encounters:  05/01/15 137/52  02/20/15 125/58  08/17/14 133/59   Well controlled

## 2015-05-02 NOTE — Progress Notes (Signed)
   Subjective:    Patient ID: Hannah [name], female    DOB: [birth year], 70 y.o.   MRN: [id number]  HPI  Panic Attack Had episode a week or so ago with sudden feeling of panic with chest pain in epigastric area.  She was sorting her recently deceased husbands belongings.  She sat and drank some liquid and felt better in 1-3 mintues.  No sweating or nausea vomiting or pain radiating. Has not happened since.  No chest pain or shortness of breath with exercise which she does 3 x a week.     Anxiety Has generally been well controlled with xanax twice daily although she does not thinks she can "feel' the xanax any more.  She enjoys living along and gets out reguarly.  Is sad about her husbands death but feels she is coping well.  Good spirits, appetite and sleep  HYPERTENSION Disease Monitoring Home BP Monitoring (Severity) not checking Symptoms - Chest pain- see above    Dyspnea- no Medications(Modifying factors) Compliance-  daily. Lightheadedness-  no  Edema- no Timing - continuous  Duration - years ROS - See HPI  PMH Lab Review   POTASSIUM  Date Value Ref Range Status  02/20/2015 4.6 3.5 - 5.3 mmol/L Final   SODIUM  Date Value Ref Range Status  02/20/2015 137 135 - 146 mmol/L Final   CREAT  Date Value Ref Range Status  02/20/2015 1.22* 0.60 - 0.93 mg/dL Final   CREATININE, SER  Date Value Ref Range Status  03/26/2010 1.18 0.40-1.20 mg/dL Final          Chief Complaint noted Review of Symptoms - see HPI PMH - Smoking status noted.   Vital Signs reviewed    Review of Systems     Objective:   Physical Exam  Alert nad Heart - Regular rate and rhythm.  No murmurs, gallops or rubs.    Lungs:  Normal respiratory effort, chest expands symmetrically. Lungs are clear to auscultation, no crackles or wheezes. Extremities:  No cyanosis, edema, or deformity noted with good range of motion of all major joints.   Psych:  Cognition and judgment appear intact. Alert,  communicative  and cooperative with normal attention span and concentration. No apparent delusions, illusions, hallucinations       Assessment & Plan:   Panis Attack Given the situation and only one episode and lack of any symptoms with exercise this seems consistent with a panic attack and not any cardiopulmonary event.  Discussed what to do if happens again

## 2015-05-08 ENCOUNTER — Encounter: Payer: Self-pay | Admitting: Family Medicine

## 2015-05-08 ENCOUNTER — Other Ambulatory Visit: Payer: Self-pay

## 2015-05-08 ENCOUNTER — Encounter (HOSPITAL_COMMUNITY): Payer: Self-pay | Admitting: Emergency Medicine

## 2015-05-08 ENCOUNTER — Ambulatory Visit (HOSPITAL_COMMUNITY)
Admission: RE | Admit: 2015-05-08 | Discharge: 2015-05-08 | Disposition: A | Discharge status: Home / Self Care | Payer: Medicare Other | Source: Ambulatory Visit | Attending: Family Medicine | Admitting: Family Medicine

## 2015-05-08 ENCOUNTER — Ambulatory Visit (INDEPENDENT_AMBULATORY_CARE_PROVIDER_SITE_OTHER): Payer: Medicare Other | Admitting: Family Medicine

## 2015-05-08 ENCOUNTER — Emergency Department (HOSPITAL_COMMUNITY): Payer: Medicare Other

## 2015-05-08 ENCOUNTER — Inpatient Hospital Stay (HOSPITAL_COMMUNITY)
Admission: EM | Admit: 2015-05-08 | Discharge: 2015-05-10 | DRG: 812 | Disposition: A | Discharge status: Home / Self Care | Payer: Medicare Other | Attending: Family Medicine | Admitting: Family Medicine

## 2015-05-08 VITALS — BP 124/58 | HR 77 | Wt 177.0 lb

## 2015-05-08 DIAGNOSIS — Z7982 Long term (current) use of aspirin: Secondary | ICD-10-CM

## 2015-05-08 DIAGNOSIS — Z79899 Other long term (current) drug therapy: Secondary | ICD-10-CM | POA: Diagnosis not present

## 2015-05-08 DIAGNOSIS — M1990 Unspecified osteoarthritis, unspecified site: Secondary | ICD-10-CM | POA: Diagnosis not present

## 2015-05-08 DIAGNOSIS — E7800 Pure hypercholesterolemia, unspecified: Secondary | ICD-10-CM | POA: Diagnosis not present

## 2015-05-08 DIAGNOSIS — D509 Iron deficiency anemia, unspecified: Secondary | ICD-10-CM | POA: Diagnosis not present

## 2015-05-08 DIAGNOSIS — R0600 Dyspnea, unspecified: Secondary | ICD-10-CM

## 2015-05-08 DIAGNOSIS — F411 Generalized anxiety disorder: Secondary | ICD-10-CM | POA: Diagnosis present

## 2015-05-08 DIAGNOSIS — K219 Gastro-esophageal reflux disease without esophagitis: Secondary | ICD-10-CM | POA: Diagnosis not present

## 2015-05-08 DIAGNOSIS — Z6834 Body mass index (BMI) 34.0-34.9, adult: Secondary | ICD-10-CM | POA: Diagnosis not present

## 2015-05-08 DIAGNOSIS — E669 Obesity, unspecified: Secondary | ICD-10-CM | POA: Diagnosis present

## 2015-05-08 DIAGNOSIS — R0789 Other chest pain: Secondary | ICD-10-CM | POA: Diagnosis not present

## 2015-05-08 DIAGNOSIS — R0609 Other forms of dyspnea: Secondary | ICD-10-CM | POA: Diagnosis present

## 2015-05-08 DIAGNOSIS — R079 Chest pain, unspecified: Secondary | ICD-10-CM | POA: Insufficient documentation

## 2015-05-08 DIAGNOSIS — R0602 Shortness of breath: Secondary | ICD-10-CM | POA: Diagnosis not present

## 2015-05-08 DIAGNOSIS — Z87891 Personal history of nicotine dependence: Secondary | ICD-10-CM

## 2015-05-08 DIAGNOSIS — D649 Anemia, unspecified: Secondary | ICD-10-CM | POA: Diagnosis present

## 2015-05-08 DIAGNOSIS — I10 Essential (primary) hypertension: Secondary | ICD-10-CM | POA: Diagnosis present

## 2015-05-08 HISTORY — DX: Gastro-esophageal reflux disease without esophagitis: K21.9

## 2015-05-08 HISTORY — DX: Pure hypercholesterolemia, unspecified: E78.00

## 2015-05-08 HISTORY — DX: Anemia, unspecified: D64.9

## 2015-05-08 HISTORY — DX: Anxiety disorder, unspecified: F41.9

## 2015-05-08 HISTORY — DX: Personal history of other medical treatment: Z92.89

## 2015-05-08 HISTORY — DX: Unspecified osteoarthritis, unspecified site: M19.90

## 2015-05-08 HISTORY — DX: Pneumonia, unspecified organism: J18.9

## 2015-05-08 LAB — IRON AND TIBC
IRON: 11 ug/dL — AB (ref 28–170)
Saturation Ratios: 2 % — ABNORMAL LOW (ref 10.4–31.8)
TIBC: 503 ug/dL — ABNORMAL HIGH (ref 250–450)
UIBC: 492 ug/dL

## 2015-05-08 LAB — HEMOGLOBIN AND HEMATOCRIT, BLOOD
HCT: 25.9 % — ABNORMAL LOW (ref 36.0–46.0)
HEMOGLOBIN: 7.9 g/dL — AB (ref 12.0–15.0)

## 2015-05-08 LAB — BASIC METABOLIC PANEL
Anion gap: 8 (ref 5–15)
BUN: 23 mg/dL — AB (ref 6–20)
CALCIUM: 9.3 mg/dL (ref 8.9–10.3)
CO2: 25 mmol/L (ref 22–32)
CREATININE: 1.37 mg/dL — AB (ref 0.44–1.00)
Chloride: 108 mmol/L (ref 101–111)
GFR calc Af Amer: 44 mL/min — ABNORMAL LOW (ref >60)
GFR, EST NON AFRICAN AMERICAN: 38 mL/min — AB (ref >60)
Glucose, Bld: 128 mg/dL — ABNORMAL HIGH (ref 65–99)
Potassium: 5.1 mmol/L (ref 3.5–5.1)
SODIUM: 141 mmol/L (ref 135–145)

## 2015-05-08 LAB — I-STAT TROPONIN, ED: TROPONIN I, POC: 0.03 ng/mL (ref 0.00–0.08)

## 2015-05-08 LAB — POC OCCULT BLOOD, ED: Fecal Occult Bld: NEGATIVE

## 2015-05-08 LAB — MAGNESIUM: Magnesium: 1.8 mg/dL (ref 1.7–2.4)

## 2015-05-08 LAB — ABO/RH: ABO/RH(D): O NEG

## 2015-05-08 LAB — LACTATE DEHYDROGENASE: LDH: 165 U/L (ref 98–192)

## 2015-05-08 LAB — BRAIN NATRIURETIC PEPTIDE: B NATRIURETIC PEPTIDE 5: 193.6 pg/mL — AB (ref 0.0–100.0)

## 2015-05-08 LAB — FERRITIN: FERRITIN: 4 ng/mL — AB (ref 11–307)

## 2015-05-08 LAB — PREPARE RBC (CROSSMATCH)

## 2015-05-08 MED ORDER — SODIUM CHLORIDE 0.9 % IV SOLN
Freq: Once | INTRAVENOUS | Status: AC
  Administered 2015-05-08: 18:00:00 via INTRAVENOUS

## 2015-05-08 MED ORDER — ACETAMINOPHEN 325 MG PO TABS
650.0000 mg | ORAL_TABLET | Freq: Four times a day (QID) | ORAL | Status: DC | PRN
  Administered 2015-05-08 – 2015-05-10 (×4): 650 mg via ORAL
  Filled 2015-05-08 (×4): qty 2

## 2015-05-08 MED ORDER — HYDROCHLOROTHIAZIDE 25 MG PO TABS
25.0000 mg | ORAL_TABLET | Freq: Every day | ORAL | Status: DC
  Administered 2015-05-09 – 2015-05-10 (×2): 25 mg via ORAL
  Filled 2015-05-08 (×2): qty 1

## 2015-05-08 MED ORDER — LISINOPRIL-HYDROCHLOROTHIAZIDE 20-25 MG PO TABS: 1.0000 | ORAL_TABLET | Freq: Every day | ORAL | Status: DC

## 2015-05-08 MED ORDER — PANTOPRAZOLE SODIUM 40 MG PO TBEC
40.0000 mg | DELAYED_RELEASE_TABLET | Freq: Every day | ORAL | Status: DC
  Administered 2015-05-08 – 2015-05-10 (×3): 40 mg via ORAL
  Filled 2015-05-08 (×3): qty 1

## 2015-05-08 MED ORDER — ALPRAZOLAM 0.25 MG PO TABS
0.2500 mg | ORAL_TABLET | Freq: Two times a day (BID) | ORAL | Status: DC | PRN
  Administered 2015-05-08 – 2015-05-10 (×4): 0.25 mg via ORAL
  Filled 2015-05-08 (×4): qty 1

## 2015-05-08 MED ORDER — ACETAMINOPHEN 325 MG PO TABS
650.0000 mg | ORAL_TABLET | Freq: Once | ORAL | Status: AC
  Administered 2015-05-08: 650 mg via ORAL
  Filled 2015-05-08: qty 2

## 2015-05-08 MED ORDER — ACETAMINOPHEN 650 MG RE SUPP: 650.0000 mg | Freq: Four times a day (QID) | RECTAL | Status: DC | PRN

## 2015-05-08 MED ORDER — CARVEDILOL 12.5 MG PO TABS: 12.5000 mg | ORAL_TABLET | Freq: Two times a day (BID) | ORAL | Status: DC

## 2015-05-08 MED ORDER — LISINOPRIL 20 MG PO TABS
20.0000 mg | ORAL_TABLET | Freq: Every day | ORAL | Status: DC
  Administered 2015-05-09 – 2015-05-10 (×2): 20 mg via ORAL
  Filled 2015-05-08 (×2): qty 1

## 2015-05-08 MED ORDER — ASPIRIN 81 MG PO CHEW
324.0000 mg | CHEWABLE_TABLET | Freq: Once | ORAL | Status: AC
  Administered 2015-05-08: 324 mg via ORAL
  Filled 2015-05-08: qty 4

## 2015-05-08 MED ORDER — SODIUM CHLORIDE 0.9 % IJ SOLN
3.0000 mL | Freq: Two times a day (BID) | INTRAMUSCULAR | Status: DC
  Administered 2015-05-09 – 2015-05-10 (×3): 3 mL via INTRAVENOUS

## 2015-05-08 NOTE — ED Notes (Signed)
Critical value hemoglobin 5.8, Dr. Billy Fischer notified.

## 2015-05-08 NOTE — Progress Notes (Signed)
   Subjective:    Patient ID: Hannah [name], female    DOB: 02/15/1946, 70 y.o.   MRN: [id number]  HPI  SHORTNESS OF BREATH  Has been short of breath for 7 or so days.  Was not having when seen one week ago  Severity: notices when goes to mailbox and when sweeping.  When rests seems to improve.  Associated with chest heaviness without pain or nausea vomiting or diaphoresis.  Has not started exercising again since her holiday break New Medications - no Kidney problems: no Heart problems: no History of cancer: no  Symptoms Fever: no Sputum: no Wheezing or asthma: no Leg swelling: no Chest Pain: see above Immobility: no Stomach pain or black bowel movements: no Weight loss: no  Chief Complaint noted Review of Symptoms - see HPI PMH - Smoking status noted.   Vital Signs reviewed    Review of Systems     Objective:   Physical Exam  Alert nad  Accompanied by her daughter Heart - Regular rate and rhythm.  Gr 1/6 early systolic murmur at LSB,  No gallops or rubs.   Lungs:  Normal respiratory effort, chest expands symmetrically. Lungs are clear to auscultation, no crackles or wheezes. Extremities:  No cyanosis, edema, or deformity noted with good range of motion of all major joints.   Abdomen: soft and non-tender without masses, organomegaly or hernias noted.  No guarding or rebound  Ambulatory pulse ox - no change stays at 98%  ECG - no prior to compare -  NSR no signs of ischemia         Assessment & Plan:   Worsening shortness of breath with exertion - consistent with unstable angina.  Patient does not feel safe to be at home where she lives alone.  Does not have cardiologist. Will send to ER for cardiac evaluation

## 2015-05-08 NOTE — H&P (Signed)
[facility] Admission History and Physical Service Pager: [phone]  [name] Medical record number: [id number] Date of birth: Jan 07, 1946 Age: 70 y.o. Gender: female  Primary Care Provider: [name], MD Consultants: none Code Status: Full (per discussion on admission)  Chief Complaint: shortness of breath  Assessment and Plan: Hannah [name] is a 70 y.o. female presenting with shortness of breath . PMH is significant for anxiety, obesity, HTN, GERD  Symptomatic anemia: Approximately 3 week hx of headache, shortness of breath, chest tightness, palpitations, and dizziness. Pt also has anxiety would could have added to symptoms. EKG and CXR WNL. Hemoglobin 5.8, MCV 82.9, HCT 20.4 on admission. Labs reveal iron deficiency anemia; Fe low at 11, TIBC increased at 503, low ferritin at 4. Not likely anemia of chronic disease as TIBC would be low. Possible causes of iron deficiency include; decreased iron intake in diet, blood loss, or decreased iron absorption. Unlikely GI bleed as FOBT was negative and pt denies melena, hematochezia, or hemoptysis but she does take daily ASA. Last colonoscopy was in 2004 and no polyps were noted, only 1 diverticula and 1 non bleeding hemorrhoid. No vaginal bleeding per patient, still has her uterus. Denies hematuria. VSS on admission.  Patient pale but well appearing and breathing on RA normally.   - Admit under Dr Ree Kida, telemetry - s/p type and screen: Continue 2 units of pRBCs transfusion - Check hgb and hct after transfusion - UA to evaluate for hematuria - Fe supplementation. Consider IV Iron transfusion before discharge - Haptoglobin pending - peripheral smear - Daily CBCs - Transfusion threshold hgb < 7  Anxiety. Takes Xanax at home - Continue home Xanax  HTN: Currently normotensive at 135/76. Takes  Lisinopril- HCTZ and Coreg at home - Continue home medications  GERD: Takes Omeprazole  at home - Continue home meds  Concerns for Dementia: Patient's daughter was concerned that the patient's memory has been declining, especially over the last year. Concerned for Alzheimer's.  No focal neurologic deficits at this time, so low suspicion for CVA.    - MMSE - Could consider imaging of brain given persistent headache and memory impairment, but again no focal neuro deficits on exam  FEN/GI: regular diet, SLIV Prophylaxis: SCDs   Disposition: Admit to telemetry  History of Present Illness:  Hannah [name] is a 70 y.o. female presenting with shortness of breath, chest tightness  Patient notes that she was sweeping on 1/10, when she noted that she chest tightness and palpitations.  She called her PCP because she thought she was having an anxiety attack.  She was evaluated in office today where she had an EKG and she was told it was normal.  Her BP was noted to be low as well.  She notes that she has had a daily headache for 3 weeks.  She reports malaise and shortness of breath with walking to the mailbox.  She notes tightness in her chest again at that time.  She also reports occ dizziness.  Denies numbness, tingling, vision changes, hematochezia, melena, vaginal bleeding, cough, myalgia, LOC, falls.  She reports occ vomiting.  She associates this with nerves.  Sees Dr Collene Mares for GI.  Last colonoscopy 2004.  Patient has been taking Tylenol/ Excedrin Migraine 3-5 days per week over the last 3 weeks.  Lives alone.  Performs ADLs independently. Daughter was concerned with Alzheimers.  She is unsure who, if anyone, the HCPOA is.  She will ask her siblings.  Review Of Systems: Per HPI with the following additions: none Otherwise the remainder of the systems were negative.  Patient Active Problem List   Diagnosis Date Noted  . Shortness of breath 05/08/2015  . Anemia 05/08/2015  . Symptomatic anemia 05/08/2015  . Dyspnea on exertion 05/08/2015  . OBESITY 03/26/2010  . Anxiety state  08/09/2008  . Essential hypertension 08/09/2008  . GERD 08/09/2008    Past Medical History: Past Medical History  Diagnosis Date  . Hypertension   . Osteoarthritis   . Anxiety   . High cholesterol     Past Surgical History: History reviewed. No pertinent past surgical history.  Social History: Social History  Substance Use Topics  . Smoking status: Former Smoker    Quit date: 05/06/1999  . Smokeless tobacco: Never Used  . Alcohol Use: No   Additional social history: none  Please also refer to relevant sections of EMR.  Family History: Family History  Problem Relation Age of Onset  . Heart disease Mother     died at age 32  . Breast cancer Sister   . Cancer Sister     Breast   Allergies and Medications: No Known Allergies No current facility-administered medications on file prior to encounter.   Current Outpatient Prescriptions on File Prior to Encounter  Medication Sig Dispense Refill  . ALPRAZolam (XANAX) 0.25 MG tablet Take 1 tablet (0.25 mg total) by mouth 2 (two) times daily as needed. for anxiety 60 tablet 5  . aspirin 81 MG tablet Take 81 mg by mouth daily.      . carvedilol (COREG) 12.5 MG tablet TAKE 1 TABLET BY MOUTH TWICE DAILY WITH A MEAL 180 tablet 1  . lisinopril-hydrochlorothiazide (PRINZIDE,ZESTORETIC) 20-25 MG tablet TAKE 1 TABLET BY MOUTH EVERY DAY 90 tablet 1  . omeprazole (PRILOSEC) 20 MG capsule TAKE ONE CAPSULE BY MOUTH DAILY AS NEEDED 90 capsule 2    Objective: BP 149/55 mmHg  Pulse 89  Temp(Src) 98.5 F (36.9 C) (Oral)  Resp 18  SpO2 100% Exam: General: In NAD, laying comfortably in bed talking with family in the room Eyes: Non icteric sclera. PERRLA, positive conjunctival pallor ENTM: moist mucous membranes, pale gums Neck: supple Cardiovascular: regular rate and rhythm, normal s1 and s2, no murmurs appreciated. Cap refill <3 seconds. 2+ pulses in bilateral dorsalis pedis. No edema Respiratory: normal work of breathing, clear to  auscultation bilaterally Abdomen: soft, non distended, non tender, normal bowel sounds, no masses palpated  MSK: normal range of motion in all joints, some arthritic changes seen in digits Skin: no rashes or ecchymosis. No jaundice Neuro: CN 2-12 intact. 5/5 strength in bilateral upper and lower extremities. Sensation intact throughout. No protonator drift or tremor. Gait examination deferred.  Psych: Normal mood and affect, speech normal  Labs and Imaging: CBC BMET   Recent Labs Lab 05/08/15 1201  WBC 5.8  HGB 5.8*  HCT 20.4*  PLT 284    Recent Labs Lab 05/08/15 1201  NA 141  K 5.1  CL 108  CO2 25  BUN 23*  CREATININE 1.37*  GLUCOSE 128*  CALCIUM 9.3     Dg Chest 2 View  05/08/2015  CLINICAL DATA:  Shortness of breath and chest pain for 1 day EXAM: CHEST  2 VIEW COMPARISON:  Chest radiograph May 30, 2004; chest CT June 03, 2004 FINDINGS: There is no edema or consolidation. The heart size and pulmonary vascularity are normal. No adenopathy. No bone lesions. There is atherosclerotic calcification in the  aortic arch region. No pneumothorax. IMPRESSION: No edema or consolidation. Electronically Signed   By: Lowella Grip III M.D.   On: 05/08/2015 13:00    Carlyle Dolly, MD 05/08/2015, 3:08 PM PGY-1, [place] Intern pager: [phone], text pages welcome  I have separately seen and examined the patient. I have discussed the findings and exam with Dr Juanito Doom and agree with the above note.  My changes/additions are outlined in BLUE.   Aavya Shafer M. Lajuana Ripple, DO PGY-2, [place]

## 2015-05-08 NOTE — ED Notes (Signed)
Attempted report 

## 2015-05-08 NOTE — ED Notes (Signed)
Per [facility], pt started feeling Chest pressure last week, with some dyspnea and nausea. Went to UC, sent here by [facility]. EKG unremarkable. Denies pain or pressure at this time, states it comes and goes. No medical hx other than HTN.

## 2015-05-08 NOTE — ED Provider Notes (Signed)
CSN: [id number]     Arrival date & time 05/08/15  1122 History   First MD Initiated Contact with Patient 05/08/15 1124     Chief Complaint  Patient presents with  . Chest Pain     (Consider location/radiation/quality/duration/timing/severity/associated sxs/prior Treatment) HPI Comments: 1.5 weeks of chest pain Tightness after sweeping 1.5wk ago Substernal chest tightness, comes and goes No radiation SOB, diaphoresis No nausea Yesterday SOB and chest tightness when walking to mail box Exertional CP Day before tried to lift 5.b weights Htn, hlpd, no fam hx, no smoking (remote smoking 90yr ago)     Patient is a 70 y.o. female presenting with chest pain.  Chest Pain Associated symptoms: fatigue and shortness of breath   Associated symptoms: no abdominal pain, no back pain, no cough, no fever, no headache, no nausea and not vomiting     Past Medical History  Diagnosis Date  . Hypertension   . Anxiety   . High cholesterol   . GERD (gastroesophageal reflux disease)   . Anemia   . Osteoarthritis   . Arthritis     "hands" (05/09/2015)  . Pneumonia 05/2004  . History of blood transfusion 1960s; 05/08/2015    "when I had my kids; low HgB"   Past Surgical History  Procedure Laterality Date  . Cardiac catheterization  05/2004  . Colonoscopy  07/2002    Archie Endo 09/02/2010  . Esophagogastroduodenoscopy  07/2002    w/biopsies/notes 09/02/2010   Family History  Problem Relation Age of Onset  . Heart disease Mother     died at age 39  . Breast cancer Sister   . Cancer Sister     Breast   Social History  Substance Use Topics  . Smoking status: Former Smoker -- 0.12 packs/day for 4 years    Types: Cigarettes    Quit date: 05/30/2004  . Smokeless tobacco: Never Used  . Alcohol Use: No   OB History    No data available     Review of Systems  Constitutional: Positive for fatigue. Negative for fever.  HENT: Negative for sore throat.   Eyes: Negative for visual disturbance.   Respiratory: Positive for shortness of breath. Negative for cough.   Cardiovascular: Positive for chest pain.  Gastrointestinal: Negative for nausea, vomiting, abdominal pain, diarrhea and blood in stool.  Genitourinary: Negative for vaginal bleeding and difficulty urinating.  Musculoskeletal: Negative for back pain and neck pain.  Skin: Negative for rash.  Neurological: Negative for syncope and headaches.      Allergies  Review of patient's allergies indicates no known allergies.  Home Medications   Prior to Admission medications   Medication Sig Start Date End Date Taking? Authorizing Provider  ALPRAZolam (XANAX) 0.25 MG tablet Take 1 tablet (0.25 mg total) by mouth 2 (two) times daily as needed. for anxiety 02/20/15  Yes [name], MD  aspirin 81 MG tablet Take 81 mg by mouth daily.     Yes Historical Provider, MD  carvedilol (COREG) 12.5 MG tablet TAKE 1 TABLET BY MOUTH TWICE DAILY WITH A MEAL 02/25/15  Yes [name], MD  lisinopril-hydrochlorothiazide (PRINZIDE,ZESTORETIC) 20-25 MG tablet TAKE 1 TABLET BY MOUTH EVERY DAY 02/25/15  Yes [name], MD  omeprazole (PRILOSEC) 20 MG capsule TAKE ONE CAPSULE BY MOUTH DAILY AS NEEDED 10/29/14  Yes [name], MD   BP 151/74 mmHg  Pulse 92  Temp(Src) 97.7 F (36.5 C) (Oral)  Resp 18  Ht 5\' 1"  (1.549 m)  Wt 180 lb 12.8 oz (82.01 kg)  BMI 34.18 kg/m2  SpO2 97% Physical Exam  Constitutional: She is oriented to person, place, and time. She appears well-developed and well-nourished. No distress.  HENT:  Head: Normocephalic and atraumatic.  Eyes: Conjunctivae and EOM are normal.  Neck: Normal range of motion.  Cardiovascular: Normal rate, regular rhythm, normal heart sounds and intact distal pulses.  Exam reveals no gallop and no friction rub.   No murmur heard. Pulmonary/Chest: Effort normal and breath sounds normal. No respiratory distress. She has no wheezes. She has no rales.  Abdominal:  Soft. She exhibits no distension. There is no tenderness. There is no guarding.  Musculoskeletal: She exhibits no edema or tenderness.  Neurological: She is alert and oriented to person, place, and time.  Skin: Skin is warm and dry. No rash noted. She is not diaphoretic. No erythema.  Nursing note and vitals reviewed.   ED Course  Procedures (including critical care time) Labs Review Labs Reviewed  BASIC METABOLIC PANEL - Abnormal; Notable for the following:    Glucose, Bld 128 (*)    BUN 23 (*)    Creatinine, Ser 1.37 (*)    GFR calc non Af Amer 38 (*)    GFR calc Af Amer 44 (*)    All other components within normal limits  CBC - Abnormal; Notable for the following:    RBC 2.46 (*)    Hemoglobin 5.8 (*)    HCT 20.4 (*)    MCH 23.6 (*)    MCHC 28.4 (*)    All other components within normal limits  BRAIN NATRIURETIC PEPTIDE - Abnormal; Notable for the following:    B Natriuretic Peptide 193.6 (*)    All other components within normal limits  IRON AND TIBC - Abnormal; Notable for the following:    Iron 11 (*)    TIBC 503 (*)    Saturation Ratios 2 (*)    All other components within normal limits  FERRITIN - Abnormal; Notable for the following:    Ferritin 4 (*)    All other components within normal limits  CBC - Abnormal; Notable for the following:    RBC 3.21 (*)    Hemoglobin 8.2 (*)    HCT 27.1 (*)    MCH 25.5 (*)    All other components within normal limits  BASIC METABOLIC PANEL - Abnormal; Notable for the following:    Glucose, Bld 109 (*)    BUN 25 (*)    Creatinine, Ser 1.28 (*)    Calcium 8.7 (*)    GFR calc non Af Amer 41 (*)    GFR calc Af Amer 48 (*)    All other components within normal limits  HEMOGLOBIN AND HEMATOCRIT, BLOOD - Abnormal; Notable for the following:    Hemoglobin 7.9 (*)    HCT 25.9 (*)    All other components within normal limits  MAGNESIUM  LACTATE DEHYDROGENASE  HAPTOGLOBIN  PATHOLOGIST SMEAR REVIEW  I-STAT TROPOININ, ED  POC  OCCULT BLOOD, ED  TYPE AND SCREEN  PREPARE RBC (CROSSMATCH)  ABO/RH    Imaging Review Dg Chest 2 View  05/08/2015  CLINICAL DATA:  Shortness of breath and chest pain for 1 day EXAM: CHEST  2 VIEW COMPARISON:  Chest radiograph May 30, 2004; chest CT June 03, 2004 FINDINGS: There is no edema or consolidation. The heart size and pulmonary vascularity are normal. No adenopathy. No bone lesions. There is atherosclerotic calcification in the aortic arch  region. No pneumothorax. IMPRESSION: No edema or consolidation. Electronically Signed   By: Lowella Grip III M.D.   On: 05/08/2015 13:00   I have personally reviewed and evaluated these images and lab results as part of my medical decision-making.   EKG Interpretation   Date/Time:  Wednesday May 08 2015 11:39:09 EST Ventricular Rate:  76 PR Interval:  177 QRS Duration: 91 QT Interval:  381 QTC Calculation: 428 R Axis:   62 Text Interpretation:  Sinus rhythm No significant change since last  tracing Confirmed by [facility] MD, Reannah Totten ([id number]) on 05/08/2015 11:48:32 AM      CRITICAL CARE: Anemia with chest pain requiring transfusion Performed by: Alvino Chapel   Total critical care time: 30 minutes  Critical care time was exclusive of separately billable procedures and treating other patients.  Critical care was necessary to treat or prevent imminent or life-threatening deterioration.  Critical care was time spent personally by me on the following activities: development of treatment plan with patient and/or surrogate as well as nursing, discussions with consultants, evaluation of patient's response to treatment, examination of patient, obtaining history from patient or surrogate, ordering and performing treatments and interventions, ordering and review of laboratory studies, ordering and review of radiographic studies, pulse oximetry and re-evaluation of patient's condition.  MDM   Final diagnoses:  Dyspnea  on exertion  Symptomatic anemia  Chest pain, unspecified chest pain type   70yo female with history of hypertension, hypercholesterolemia presents with concern for exertional chest tightness and shortness of breath.  EKG unchanged from prior, troponin negative.  Hgb 5.8, likely etiology of patient's anginal symptoms.  Hemoccult negative and pt denies any known black/bloody. Hemodynamically stable.  Ordered transfusion 2 units pRBCs. Patient admitted to family medicine for further care of chest pain and anemia.       Gareth Morgan, MD 05/09/15 [phone]

## 2015-05-09 ENCOUNTER — Encounter (HOSPITAL_COMMUNITY): Payer: Self-pay | Admitting: General Practice

## 2015-05-09 DIAGNOSIS — I10 Essential (primary) hypertension: Secondary | ICD-10-CM

## 2015-05-09 DIAGNOSIS — R0609 Other forms of dyspnea: Secondary | ICD-10-CM

## 2015-05-09 DIAGNOSIS — R079 Chest pain, unspecified: Secondary | ICD-10-CM

## 2015-05-09 LAB — TYPE AND SCREEN
ABO/RH(D): O NEG
ANTIBODY SCREEN: NEGATIVE
UNIT DIVISION: 0
UNIT TAG COMMENT: O POS
Unit division: 0
Unit tag comment: O POS

## 2015-05-09 LAB — BASIC METABOLIC PANEL
ANION GAP: 7 (ref 5–15)
BUN: 25 mg/dL — ABNORMAL HIGH (ref 6–20)
CHLORIDE: 107 mmol/L (ref 101–111)
CO2: 26 mmol/L (ref 22–32)
CREATININE: 1.28 mg/dL — AB (ref 0.44–1.00)
Calcium: 8.7 mg/dL — ABNORMAL LOW (ref 8.9–10.3)
GFR calc non Af Amer: 41 mL/min — ABNORMAL LOW (ref >60)
GFR, EST AFRICAN AMERICAN: 48 mL/min — AB (ref >60)
Glucose, Bld: 109 mg/dL — ABNORMAL HIGH (ref 65–99)
POTASSIUM: 4.5 mmol/L (ref 3.5–5.1)
SODIUM: 140 mmol/L (ref 135–145)

## 2015-05-09 LAB — CBC
HCT: 20.4 % — ABNORMAL LOW (ref 36.0–46.0)
HEMATOCRIT: 27.1 % — AB (ref 36.0–46.0)
HEMOGLOBIN: 8.2 g/dL — AB (ref 12.0–15.0)
Hemoglobin: 5.8 g/dL — CL (ref 12.0–15.0)
MCH: 23.6 pg — AB (ref 26.0–34.0)
MCH: 25.5 pg — ABNORMAL LOW (ref 26.0–34.0)
MCHC: 28.4 g/dL — AB (ref 30.0–36.0)
MCHC: 30.3 g/dL (ref 30.0–36.0)
MCV: 82.9 fL (ref 78.0–100.0)
MCV: 84.4 fL (ref 78.0–100.0)
PLATELETS: 284 10*3/uL (ref 150–400)
Platelets: 269 10*3/uL (ref 150–400)
RBC: 2.46 MIL/uL — ABNORMAL LOW (ref 3.87–5.11)
RBC: 3.21 MIL/uL — AB (ref 3.87–5.11)
RDW: 14.6 % (ref 11.5–15.5)
RDW: 14.9 % (ref 11.5–15.5)
WBC: 5.8 10*3/uL (ref 4.0–10.5)
WBC: 9.3 10*3/uL (ref 4.0–10.5)

## 2015-05-09 LAB — URINALYSIS, DIPSTICK ONLY
Bilirubin Urine: NEGATIVE
Glucose, UA: NEGATIVE mg/dL
HGB URINE DIPSTICK: NEGATIVE
Ketones, ur: NEGATIVE mg/dL
LEUKOCYTES UA: NEGATIVE
NITRITE: NEGATIVE
PROTEIN: NEGATIVE mg/dL
Specific Gravity, Urine: 1.008 (ref 1.005–1.030)
pH: 7 (ref 5.0–8.0)

## 2015-05-09 LAB — PATHOLOGIST SMEAR REVIEW

## 2015-05-09 LAB — HAPTOGLOBIN: HAPTOGLOBIN: 165 mg/dL (ref 34–200)

## 2015-05-09 MED ORDER — CARVEDILOL 12.5 MG PO TABS
12.5000 mg | ORAL_TABLET | Freq: Two times a day (BID) | ORAL | Status: DC
  Administered 2015-05-09 – 2015-05-10 (×3): 12.5 mg via ORAL
  Filled 2015-05-09 (×3): qty 1

## 2015-05-09 NOTE — Progress Notes (Signed)
Family Medicine Teaching Service Daily Progress Note Intern Pager: [phone]  [name] Medical record number: [id number] Date of birth: 07-12-1945 Age: 70 y.o. Gender: female  Primary Care Provider: [name], MD Consultants: none Code Status: Full   Pt Overview and Major Events to Date:  1/18: Admitted to [facility]. 2 units pRBC transfusion  Assessment and Plan: Hannah [name] is a 70 y.o. female presenting with shortness of breath . PMH is significant for anxiety, obesity, HTN, GERD  Symptomatic anemia: Approximately 3 week hx of headache, shortness of breath, chest tightness, palpitations, and dizziness. Pt also has anxiety would could have added to symptoms. EKG and CXR WNL. Hemoglobin 5.8, MCV 82.9, HCT 20.4 on admission. Labs reveal iron deficiency anemia; Fe low at 11, TIBC increased at 503, low ferritin at 4. Not likely anemia of chronic disease as TIBC would be low. Possible causes of iron deficiency include; decreased iron intake in diet, blood loss, or decreased iron absorption. Unlikely GI bleed as FOBT was negative and pt denies melena, hematochezia, or hemoptysis but she does take daily ASA. Last colonoscopy was in 2004 and no polyps were noted, only 1 diverticula and 1 non bleeding hemorrhoid. No vaginal bleeding per patient, still has her uterus. Denies hematuria. VSS on admission. Patient pale but well appearing and breathing on RA normally.s/p 2 units of pRBCs transfusion. Hgb now 8.2. Haptoglobin normal. - UA to evaluate for hematuria - Fe supplementation. Consider IV Iron transfusion before discharge - peripheral smear - Daily CBCs - Transfusion threshold hgb < 7  Anxiety. Takes Xanax at home - Continue home Xanax  HTN: Currently normotensive at 135/76. Takes Lisinopril- HCTZ and Coreg at home - Continue home medications  GERD: Takes Omeprazole at home - Continue home PPI  Concerns for Dementia: Patient's daughter was concerned that  the patient's memory has been declining, especially over the last year. Concerned for Alzheimer's. No focal neurologic deficits at this time, so low suspicion for CVA.MMSE 29 today, no cognitive impairment.  - Could consider imaging of brain given persistent headache and memory impairment, but again no focal neuro deficits on exam  FEN/GI: regular diet, SLIV Prophylaxis: SCDs   Disposition: Home tomorrow  Subjective:  - Pt feeling good today except for a headache - Denies palpitations, chest pain, shortness of breath, or leg cramping - Has a good appetite - Daughters and rest of family are coming to see her today  Objective: Temp:  [97.5 F (36.4 C)-98.9 F (37.2 C)] 97.7 F (36.5 C) (01/19 0436) Pulse Rate:  [75-96] 92 (01/19 0436) Resp:  [14-27] 18 (01/19 0436) BP: (113-154)/(42-99) 151/74 mmHg (01/19 0436) SpO2:  [97 %-100 %] 97 % (01/19 0436) Weight:  [177 lb (80.287 kg)-181 lb 3.5 oz (82.2 kg)] 180 lb 12.8 oz (82.01 kg) (01/19 0436) Physical Exam: General: In NAD, laying in bed very alert and talkative Cardiovascular: RRR, normal s1 and s2. No murmurs Respiratory: normal work of breathing, clear to auscultation bilaterally Abdomen: soft, non distended, non tender, no masses palpated, normal bowel sounds Extremities: no edema  Laboratory:  Recent Labs Lab 05/08/15 1201 05/08/15 2230 05/09/15 0600  WBC 5.8  --  9.3  HGB 5.8* 7.9* 8.2*  HCT 20.4* 25.9* 27.1*  PLT 284  --  269    Recent Labs Lab 05/08/15 1201 05/09/15 0600  NA 141 140  K 5.1 4.5  CL 108 107  CO2 25 26  BUN 23* 25*  CREATININE 1.37* 1.28*  CALCIUM 9.3  8.7*  GLUCOSE 128* 109*    Imaging/Diagnostic Tests: Dg Chest 2 View  05/08/2015  CLINICAL DATA:  Shortness of breath and chest pain for 1 day EXAM: CHEST  2 VIEW COMPARISON:  Chest radiograph May 30, 2004; chest CT June 03, 2004 FINDINGS: There is no edema or consolidation. The heart size and pulmonary vascularity are normal. No  adenopathy. No bone lesions. There is atherosclerotic calcification in the aortic arch region. No pneumothorax. IMPRESSION: No edema or consolidation. Electronically Signed   By: Lowella Grip III M.D.   On: 05/08/2015 13:00     Carlyle Dolly, MD 05/09/2015, 7:09 AM PGY-1, [place] Intern pager: [phone], text pages welcome

## 2015-05-09 NOTE — Care Management Note (Signed)
Case Management Note  Patient Details  Name: Hannah [name] MRN: [id number] Date of Birth: 04-Dec-1945  Subjective/Objective:                    Action/Plan: Patient was admitted with anemia. Lives at home alone. Will follow for discharge needs.  Expected Discharge Date:                  Expected Discharge Plan:     In-House Referral:     Discharge planning Services     Post Acute Care Choice:    Choice offered to:     DME Arranged:    DME Agency:     HH Arranged:    [place] Agency:     Status of Service:     Medicare Important Message Given:    Date Medicare IM Given:    Medicare IM give by:    Date Additional Medicare IM Given:    Additional Medicare Important Message give by:     If discussed at [place] of Stay Meetings, dates discussed:    Additional Comments:  Rolm Baptise, RN 05/09/2015, 9:50 AM [phone]

## 2015-05-10 ENCOUNTER — Other Ambulatory Visit: Payer: Self-pay | Admitting: Family Medicine

## 2015-05-10 DIAGNOSIS — D649 Anemia, unspecified: Secondary | ICD-10-CM

## 2015-05-10 LAB — CBC
HCT: 28 % — ABNORMAL LOW (ref 36.0–46.0)
HEMOGLOBIN: 8.6 g/dL — AB (ref 12.0–15.0)
MCH: 26.4 pg (ref 26.0–34.0)
MCHC: 30.7 g/dL (ref 30.0–36.0)
MCV: 85.9 fL (ref 78.0–100.0)
Platelets: 248 10*3/uL (ref 150–400)
RBC: 3.26 MIL/uL — ABNORMAL LOW (ref 3.87–5.11)
RDW: 15.3 % (ref 11.5–15.5)
WBC: 5.9 10*3/uL (ref 4.0–10.5)

## 2015-05-10 MED ORDER — FERROUS SULFATE 325 (65 FE) MG PO TABS
325.0000 mg | ORAL_TABLET | Freq: Every day | ORAL | Status: DC
Start: 2015-05-10 — End: 2016-11-20

## 2015-05-10 MED ORDER — DOCUSATE SODIUM 100 MG PO CAPS: 100.0000 mg | ORAL_CAPSULE | Freq: Two times a day (BID) | ORAL | Status: DC

## 2015-05-10 MED ORDER — SODIUM CHLORIDE 0.9 % IV SOLN
510.0000 mg | Freq: Once | INTRAVENOUS | Status: AC
  Administered 2015-05-10: 510 mg via INTRAVENOUS
  Filled 2015-05-10: qty 17

## 2015-05-10 NOTE — Discharge Instructions (Signed)
You were admitted for symptomatic iron deficiency anemia. We have given you a blood transfusion and some iron. Your red blood cell level is more normal now. You will need to continue taking a daily iron pill after leaving the hospital. A prescription has been sent to your pharmacy for the iron pill. Additionally, I have sent a prescription for Colace (stool softener) because iron pills can sometimes make you constipated.  Please follow up with Dr. Jerline Pain at your hospital follow up appointment listed above. At your follow up appointment, they will put in a gastroenterology (stomach doctor) consult to see if you could be bleeding from your stomach or intestines.  It was a pleasure caring for you, take care!  Iron Deficiency Anemia, Adult Anemia is a condition in which there are less red blood cells or hemoglobin in the blood than normal. Hemoglobin is the part of red blood cells that carries oxygen. Iron deficiency anemia is anemia caused by too little iron. It is the most common type of anemia. It may leave you tired and short of breath. CAUSES   Lack of iron in the diet.  Poor absorption of iron, as seen with intestinal disorders.  Intestinal bleeding.  Heavy periods. SIGNS AND SYMPTOMS  Mild anemia may not be noticeable. Symptoms may include:  Fatigue.  Headache.  Pale skin.  Weakness.  Tiredness.  Shortness of breath.  Dizziness.  Cold hands and feet.  Fast or irregular heartbeat. DIAGNOSIS  Diagnosis requires a thorough evaluation and physical exam by your health care provider. Blood tests are generally used to confirm iron deficiency anemia. Additional tests may be done to find the underlying cause of your anemia. These may include:  Testing for blood in the stool (fecal occult blood test).  A procedure to see inside the colon and rectum (colonoscopy).  A procedure to see inside the esophagus and stomach (endoscopy). TREATMENT  Iron deficiency anemia is treated by  correcting the cause of the deficiency. Treatment may involve:  Adding iron-rich foods to your diet.  Taking iron supplements. Pregnant or breastfeeding women need to take extra iron because their normal diet usually does not provide the required amount.  Taking vitamins. Vitamin C improves the absorption of iron. Your health care provider may recommend that you take your iron tablets with a glass of orange juice or vitamin C supplement.  Medicines to make heavy menstrual flow lighter.  Surgery. HOME CARE INSTRUCTIONS   Take iron as directed by your health care provider.  If you cannot tolerate taking iron supplements by mouth, talk to your health care provider about taking them through a vein (intravenously) or an injection into a muscle.  For the best iron absorption, iron supplements should be taken on an empty stomach. If you cannot tolerate them on an empty stomach, you may need to take them with food.  Do not drink milk or take antacids at the same time as your iron supplements. Milk and antacids may interfere with the absorption of iron.  Iron supplements can cause constipation. Make sure to include fiber in your diet to prevent constipation. A stool softener may also be recommended.  Take vitamins as directed by your health care provider.  Eat a diet rich in iron. Foods high in iron include liver, lean beef, whole-grain bread, eggs, dried fruit, and dark green leafy vegetables. SEEK IMMEDIATE MEDICAL CARE IF:   You faint. If this happens, do not drive. Call your local emergency services (911 in [place] if no other  help is available.  You have chest pain.  You feel nauseous or vomit.  You have severe or increased shortness of breath with activity.  You feel weak.  You have a rapid heartbeat.  You have unexplained sweating.  You become light-headed when getting up from a chair or bed. MAKE SURE YOU:   Understand these instructions.  Will watch your  condition.  Will get help right away if you are not doing well or get worse.   This information is not intended to replace advice given to you by your health care provider. Make sure you discuss any questions you have with your health care provider.   Document Released: 04/03/2000 Document Revised: 04/27/2014 Document Reviewed: 12/12/2012 Elsevier Interactive Patient Education [employer].

## 2015-05-10 NOTE — Discharge Summary (Signed)
[facility] Discharge Summary  [name] Medical record number: [id number] Date of birth: 1945-05-31 Age: 70 y.o. Gender: female Date of Admission: 05/08/2015  Date of Discharge: 05/10/15 Admitting Physician: Lupita Dawn, MD  Primary Care Provider: [name], MD Consultants: none  Indication for Hospitalization: symptomatic anemia  Discharge Diagnoses/Problem List:  Patient Active Problem List   Diagnosis Date Noted  . Anemia, iron deficiency 05/13/2015  . Pain in the chest   . Shortness of breath 05/08/2015  . Anemia 05/08/2015  . Symptomatic anemia 05/08/2015  . Dyspnea on exertion 05/08/2015  . OBESITY 03/26/2010  . Anxiety state 08/09/2008  . Essential hypertension 08/09/2008  . GERD 08/09/2008    Disposition: home  Discharge Condition: stable  Discharge Exam: see previous progress note  Brief Hospital Course:  Hannah [name] is a 70 y.o. Female who presented with shortness of breath and was found to have symptomatic anemia. PMH is significant for anxiety, obesity, HTN, GERD  Symptomatic Iron deficiency anemia:  Pt had pproximately 3 week hx of headache, shortness of breath, chest tightness, palpitations, and dizziness. On admission: EKG and CXR WNL, Hemoglobin 5.8, MCV 82.9, HCT 20.4, normal haptoglobin and LDH. FOBT negative. Iron panel revealed Fe low at 11, TIBC increased at 503, low ferritin at 4. Denied hematuria, vaginal bleeding, hematochezia and hemoptysis. Pt appeared pale but well appearing and breathing on RA normally.Was given 2 units of pRBCs transfusion and post transfusion hgb was 8.2. UA negative for hematuria. Peripheral smear showed microcytic anemia. On day of discharge she was given Ferraheme IV Iron transfusion today for iron repletion and sent home with prescription for daily ferrous sulfate.   Issues for Follow Up:  1. GI referral: Although FOBT was negative, will need to rule out GI  source of bleeding. Last colonoscopy was in 2004.  2. Consider repeat CBC at follow up appointment to check hgb  Significant Procedures: 2 units pRBC transfusion  Significant Labs and Imaging:   Recent Labs Lab 05/08/15 1201 05/08/15 2230 05/09/15 0600 05/10/15 1020  WBC 5.8  --  9.3 5.9  HGB 5.8* 7.9* 8.2* 8.6*  HCT 20.4* 25.9* 27.1* 28.0*  PLT 284  --  269 248    Recent Labs Lab 05/08/15 1201 05/08/15 1204 05/09/15 0600  NA 141  --  140  K 5.1  --  4.5  CL 108  --  107  CO2 25  --  26  GLUCOSE 128*  --  109*  BUN 23*  --  25*  CREATININE 1.37*  --  1.28*  CALCIUM 9.3  --  8.7*  MG  --  1.8  --    No results found.   Results/Tests Pending at Time of Discharge: none  Discharge Medications:    Medication List    STOP taking these medications        aspirin 81 MG tablet      TAKE these medications        ALPRAZolam 0.25 MG tablet  Commonly known as:  XANAX  Take 1 tablet (0.25 mg total) by mouth 2 (two) times daily as needed. for anxiety     carvedilol 12.5 MG tablet  Commonly known as:  COREG  TAKE 1 TABLET BY MOUTH TWICE DAILY WITH A MEAL     docusate sodium 100 MG capsule  Commonly known as:  COLACE  Take 1 capsule (100 mg total) by mouth 2 (two) times daily.  ferrous sulfate 325 (65 FE) MG tablet  Commonly known as:  FERROUSUL  Take 1 tablet (325 mg total) by mouth daily with breakfast.     lisinopril-hydrochlorothiazide 20-25 MG tablet  Commonly known as:  PRINZIDE,ZESTORETIC  TAKE 1 TABLET BY MOUTH EVERY DAY     omeprazole 20 MG capsule  Commonly known as:  PRILOSEC  TAKE ONE CAPSULE BY MOUTH DAILY AS NEEDED        Discharge Instructions: Please refer to Patient Instructions section of EMR for full details.  Patient was counseled important signs and symptoms that should prompt return to medical care, changes in medications, dietary instructions, activity restrictions, and follow up appointments.   Follow-Up Appointments:    Follow-up Information    Follow up with Dimas Chyle, MD. Go on 05/13/2015.   Specialty:  Family Medicine   Why:  hospital follow up at 1:30 PM   Contact information:   [id number] [place] [address] [phone]        Carlyle Dolly, MD 05/10/2015, 8:17 AM PGY-1, [place]

## 2015-05-10 NOTE — Progress Notes (Signed)
Family Medicine Teaching Service Daily Progress Note Intern Pager: [phone]  [name] Medical record number: [id number] Date of birth: May 14, 1945 Age: 70 y.o. Gender: female  Primary Care Provider: [name], MD Consultants: none Code Status: Full   Pt Overview and Major Events to Date:  1/18: Admitted to [facility]. 2 units pRBC transfusion 1/19: Hgb 8.2, asymptomatic  Assessment and Plan: Hannah [name] is a 70 y.o. female presenting with shortness of breath . PMH is significant for anxiety, obesity, HTN, GERD  Symptomatic Iron deficiency anemia: Improving. Patient pale but well appearing and breathing on RA normally.s/p 2 units of pRBCs transfusion on admission. Hgb now 8.6. Haptoglobin and LDH normal. Hgb this AM. UA negative for hematuria. Peripheral smear showed microcytic anemia.  - Fe supplementation. Will get Ferraheme IV Iron transfusion today for iron repletion.  - Will need Ferrous sulfate and colace prescription on discharge.  - Transfusion threshold hgb < 7 - Although FOBT negative, cannot rule out GI source of bleed. Would benefit from outpatient GI referral  Anxiety. Takes Xanax at home - Continue home Xanax  HTN: Currently normotensive at 135/76. Takes Lisinopril- HCTZ and Coreg at home - Continue home medications  GERD: Takes Omeprazole at home - Continue home PPI  Concerns for Dementia: Patient's daughter was concerned that the patient's memory has been declining, especially over the last year. Concerned for Alzheimer's. No focal neurologic deficits at this time, so low suspicion for CVA.MMSE 29 yesterday showing no cognitive impairment.  - continue to follow up outpatient and monitor for signs of dementia  FEN/GI: regular diet, SLIV Prophylaxis: SCDs   Disposition: Home today  Subjective:  - Pt feeling good today, headache resolved - Denies palpitations, chest pain, shortness of breath, or leg cramping - Has a good  appetite - Would like to go home today  Objective: Temp:  [97.8 F (36.6 C)-98.7 F (37.1 C)] 98.1 F (36.7 C) (01/20 0453) Pulse Rate:  [78-82] 78 (01/19 2129) Resp:  [18] 18 (01/20 0453) BP: (104-150)/(52-70) 150/66 mmHg (01/20 0453) SpO2:  [97 %-99 %] 97 % (01/20 0453) Weight:  [177 lb 4 oz (80.4 kg)] 177 lb 4 oz (80.4 kg) (01/20 0453) Physical Exam: General: In NAD, sitting on side of bed, very alert and talkative Cardiovascular: RRR, normal s1 and s2. No murmurs Respiratory: normal work of breathing, clear to auscultation bilaterally Abdomen: soft, non distended, non tender, no masses palpated, normal bowel sounds Extremities: no edema  Laboratory:  Recent Labs Lab 05/08/15 1201 05/08/15 2230 05/09/15 0600 05/10/15 1020  WBC 5.8  --  9.3 5.9  HGB 5.8* 7.9* 8.2* 8.6*  HCT 20.4* 25.9* 27.1* 28.0*  PLT 284  --  269 248    Recent Labs Lab 05/08/15 1201 05/09/15 0600  NA 141 140  K 5.1 4.5  CL 108 107  CO2 25 26  BUN 23* 25*  CREATININE 1.37* 1.28*  CALCIUM 9.3 8.7*  GLUCOSE 128* 109*    Imaging/Diagnostic Tests: No results found.   Carlyle Dolly, MD 05/10/2015, 7:18 AM PGY-1, [place] Intern pager: [phone], text pages welcome

## 2015-05-10 NOTE — Progress Notes (Signed)
D/c instructions discussed with pt and pt verbalized understanding.  

## 2015-05-10 NOTE — Care Management Important Message (Signed)
Important Message  Patient Details  Name: Hannah [name] MRN: [id number] Date of Birth: 17-Apr-1946   Medicare Important Message Given:  Yes    [name] 05/10/2015, 11:29 AM

## 2015-05-13 ENCOUNTER — Encounter: Payer: Self-pay | Admitting: Family Medicine

## 2015-05-13 ENCOUNTER — Ambulatory Visit (INDEPENDENT_AMBULATORY_CARE_PROVIDER_SITE_OTHER): Payer: Medicare Other | Admitting: Family Medicine

## 2015-05-13 VITALS — BP 139/62 | HR 81 | Temp 100.1°F | Wt 171.8 lb

## 2015-05-13 DIAGNOSIS — D509 Iron deficiency anemia, unspecified: Secondary | ICD-10-CM

## 2015-05-13 DIAGNOSIS — J069 Acute upper respiratory infection, unspecified: Secondary | ICD-10-CM | POA: Diagnosis not present

## 2015-05-13 DIAGNOSIS — Z862 Personal history of diseases of the blood and blood-forming organs and certain disorders involving the immune mechanism: Secondary | ICD-10-CM | POA: Insufficient documentation

## 2015-05-13 LAB — PATHOLOGIST SMEAR REVIEW

## 2015-05-13 NOTE — Patient Instructions (Signed)
We will recheck your blood count today. Please try to increase your iron pill to 3 times a day as tolerated. If you get side effects, you can decease back to 1 pill a day.  I think you have a cold. Please try fluticasone or afrin nasal spray as needed. You can continue the fluticasone nasal spray long term, but you should not use the afrin for more than 3 days.  We will set you up to see a GI doctor for your scopes.  Please come back in 1 month to recheck your blood counts.  Take care,  Dr Jerline Pain

## 2015-05-13 NOTE — Assessment & Plan Note (Signed)
Asymptomatic today. Will check CBC. Instructed patient to increase PO iron to three times daily if tolerated. Patient has referral to GI pending. Follow up in 1 month. Return precautions reviewed.

## 2015-05-13 NOTE — Progress Notes (Signed)
    Subjective:  Hannah [name] is a 70 y.o. female who presents to the [facility] today with a chief complaint of hospital follow up.   HPI:  Iron Deficiency Anemia Patient was admitted 5 days ago with symptomatic anemia. Hgb at the time of admission was 5.8. Patient was given 2 unites of pRBC and started on iron supplementation. Her hemoglobin was stable at the time of discharge 3 days ago. She is now doing well. She is taking PO ferrous sulfate 325mg  once daily and tolerating it well. No chest [name] or shortness of breath. Headache improved. No hematochezia. Has already been referred to GI for endoscopic evaluation.   Sinus congestion Patient has also noticed increased sinus congestion since leaving the hospital. She has had a mild cough and some post nasal drip. She has had a low grade fever. No chills. No sick contacts. No medications tried.   ROS: Per HPI  Objective:  Physical Exam: BP 149/74 mmHg  Pulse 92  Temp(Src) 100.1 F (37.8 C) (Oral)  Wt 171 lb 12.8 oz (77.928 kg)  SpO2 97%  Gen: NAD, resting comfortably HEENT: TMs clear bilaterally. OP clear. No LAD. CV: RRR with no murmurs appreciated Pulm: NWOB, CTAB with no crackles, wheezes, or rhonchi GI: Normal bowel sounds present. Soft, Nontender, Nondistended. MSK: no edema, cyanosis, or clubbing noted Skin: warm, dry Neuro: grossly normal, moves all extremities Psych: Normal affect and thought content  Assessment/Plan:  Anemia, iron deficiency Asymptomatic today. Will check CBC. Instructed patient to increase PO iron to three times daily if tolerated. Patient has referral to GI pending. Follow up in 1 month. Return precautions reviewed.   Sinus Congestion Likely viral URI. Recommended OTC measures such as Afrin nasal spray or fluticasone. No signs of bacterial infection. Return precautions reviewed.   Hannah [name]. Hannah [name], [facility] Resident PGY-2 05/13/2015 2:17 PM

## 2015-05-14 ENCOUNTER — Encounter: Payer: Self-pay | Admitting: Family Medicine

## 2015-05-14 LAB — CBC
HEMATOCRIT: 31 % — AB (ref 36.0–46.0)
Hemoglobin: 9.8 g/dL — ABNORMAL LOW (ref 12.0–15.0)
MCH: 26.6 pg (ref 26.0–34.0)
MCHC: 31.6 g/dL (ref 30.0–36.0)
MCV: 84.2 fL (ref 78.0–100.0)
MPV: 9.4 fL (ref 8.6–12.4)
PLATELETS: 311 10*3/uL (ref 150–400)
RBC: 3.68 MIL/uL — ABNORMAL LOW (ref 3.87–5.11)
RDW: 17.5 % — AB (ref 11.5–15.5)
WBC: 6.9 10*3/uL (ref 4.0–10.5)

## 2015-05-16 ENCOUNTER — Telehealth: Payer: Self-pay | Admitting: Family Medicine

## 2015-05-16 NOTE — Telephone Encounter (Signed)
Called patient to inform of results. Hemoglobin improving. Follow up with PCP in 1 month.  Hannah [name]. Jerline Pain, [place] Resident PGY-2 05/16/2015 11:57 AM

## 2015-05-16 NOTE — Telephone Encounter (Signed)
Pt called and would like to know what her lab results are from Monday. jw

## 2015-05-16 NOTE — Telephone Encounter (Signed)
Will forward to Dr. Parker.  Jazmin Hartsell,CMA  

## 2015-06-04 DIAGNOSIS — D509 Iron deficiency anemia, unspecified: Secondary | ICD-10-CM | POA: Diagnosis not present

## 2015-06-04 DIAGNOSIS — K5900 Constipation, unspecified: Secondary | ICD-10-CM | POA: Diagnosis not present

## 2015-06-04 DIAGNOSIS — K5730 Diverticulosis of large intestine without perforation or abscess without bleeding: Secondary | ICD-10-CM | POA: Diagnosis not present

## 2015-06-04 DIAGNOSIS — Z1211 Encounter for screening for malignant neoplasm of colon: Secondary | ICD-10-CM | POA: Diagnosis not present

## 2015-06-12 DIAGNOSIS — K2980 Duodenitis without bleeding: Secondary | ICD-10-CM | POA: Diagnosis not present

## 2015-06-12 DIAGNOSIS — K317 Polyp of stomach and duodenum: Secondary | ICD-10-CM | POA: Diagnosis not present

## 2015-06-12 DIAGNOSIS — D509 Iron deficiency anemia, unspecified: Secondary | ICD-10-CM | POA: Diagnosis not present

## 2015-06-12 DIAGNOSIS — K635 Polyp of colon: Secondary | ICD-10-CM | POA: Diagnosis not present

## 2015-06-12 DIAGNOSIS — K3189 Other diseases of stomach and duodenum: Secondary | ICD-10-CM | POA: Diagnosis not present

## 2015-06-12 DIAGNOSIS — K621 Rectal polyp: Secondary | ICD-10-CM | POA: Diagnosis not present

## 2015-06-12 DIAGNOSIS — Z1211 Encounter for screening for malignant neoplasm of colon: Secondary | ICD-10-CM | POA: Diagnosis not present

## 2015-06-25 DIAGNOSIS — K5730 Diverticulosis of large intestine without perforation or abscess without bleeding: Secondary | ICD-10-CM | POA: Diagnosis not present

## 2015-06-25 DIAGNOSIS — D509 Iron deficiency anemia, unspecified: Secondary | ICD-10-CM | POA: Diagnosis not present

## 2015-06-25 DIAGNOSIS — K219 Gastro-esophageal reflux disease without esophagitis: Secondary | ICD-10-CM | POA: Diagnosis not present

## 2015-07-12 ENCOUNTER — Encounter: Payer: Self-pay | Admitting: Internal Medicine

## 2015-07-12 ENCOUNTER — Ambulatory Visit (INDEPENDENT_AMBULATORY_CARE_PROVIDER_SITE_OTHER): Payer: Medicare Other | Admitting: Internal Medicine

## 2015-07-12 VITALS — BP 148/67 | HR 101 | Temp 98.3°F | Wt 167.0 lb

## 2015-07-12 DIAGNOSIS — D649 Anemia, unspecified: Secondary | ICD-10-CM

## 2015-07-12 DIAGNOSIS — D509 Iron deficiency anemia, unspecified: Secondary | ICD-10-CM | POA: Diagnosis not present

## 2015-07-12 DIAGNOSIS — R05 Cough: Secondary | ICD-10-CM

## 2015-07-12 DIAGNOSIS — R059 Cough, unspecified: Secondary | ICD-10-CM | POA: Insufficient documentation

## 2015-07-12 LAB — CBC
HEMATOCRIT: 35.4 % — AB (ref 36.0–46.0)
Hemoglobin: 11.4 g/dL — ABNORMAL LOW (ref 12.0–15.0)
MCH: 29 pg (ref 26.0–34.0)
MCHC: 32.2 g/dL (ref 30.0–36.0)
MCV: 90.1 fL (ref 78.0–100.0)
MPV: 10.2 fL (ref 8.6–12.4)
Platelets: 278 10*3/uL (ref 150–400)
RBC: 3.93 MIL/uL (ref 3.87–5.11)
RDW: 19.4 % — AB (ref 11.5–15.5)
WBC: 9.8 10*3/uL (ref 4.0–10.5)

## 2015-07-12 NOTE — Assessment & Plan Note (Signed)
Likely allergy-related vs viral. Lungs clear on exam. No difficulty breathing. No systemic signs of illness. Less concern for PNA at this time.  - Recommended honey, cough drops, and OTC cough syrup if desired  - Advised patient to avoid Sudafed given history of HTN - Return if symptoms not improved in one week

## 2015-07-12 NOTE — Patient Instructions (Addendum)
It was nice meeting you today, Hannah [name]!   For your cough, you can use cough drops (Riccola brand cough drops are very effective), or a spoonful of honey, either alone or in a warm beverage, a few times a day. You can also use an over the counter cough syrup such as Delsyn. Due to your high blood pressure, please do not use Sudafed.   If your symptoms do not improve in 5-7 days, please call the office to schedule an appointment.   I will let you know your most recent hemoglobin early next week when your labwork is back.   If you have any questions or concerns, feel free to call the office at any time.   Be well,  Dr. Avon Gully  Cough, Adult A cough helps to clear your throat and lungs. A cough may last only 2-3 weeks (acute), or it may last longer than 8 weeks (chronic). Many different things can cause a cough. A cough may be a sign of an illness or another medical condition. HOME CARE  Pay attention to any changes in your cough.  Take medicines only as told by your doctor.  If you were prescribed an antibiotic medicine, take it as told by your doctor. Do not stop taking it even if you start to feel better.  Talk with your doctor before you try using a cough medicine.  Drink enough fluid to keep your pee (urine) clear or pale yellow.  If the air is dry, use a cold steam vaporizer or humidifier in your home.  Stay away from things that make you cough at work or at home.  If your cough is worse at night, try using extra pillows to raise your head up higher while you sleep.  Do not smoke, and try not to be around smoke. If you need help quitting, ask your doctor.  Do not have caffeine.  Do not drink alcohol.  Rest as needed. GET HELP IF:  You have new problems (symptoms).  You cough up yellow fluid (pus).  Your cough does not get better after 2-3 weeks, or your cough gets worse.  Medicine does not help your cough and you are not sleeping well.  You have pain that  gets worse or pain that is not helped with medicine.  You have a fever.  You are losing weight and you do not know why.  You have night sweats. GET HELP RIGHT AWAY IF:  You cough up blood.  You have trouble breathing.  Your heartbeat is very fast.   This information is not intended to replace advice given to you by your health care provider. Make sure you discuss any questions you have with your health care provider.   Document Released: 12/18/2010 Document Revised: 12/26/2014 Document Reviewed: 06/13/2014 Elsevier Interactive Patient Education [employer].

## 2015-07-12 NOTE — Assessment & Plan Note (Signed)
Improving. Currently asymptomatic. Colonoscopy with no signs of GI blood loss.  - Check CBC today - Will call patient with results

## 2015-07-12 NOTE — Progress Notes (Signed)
   Subjective:    Patient ID: Hannah [name], female    DOB: [birth year], 70 y.o.   MRN: [id number]  HPI  Patient presents for same day appointment for cough.   Cough Patient reports cough and associated sore throat beginning yesterday. She was recently walking in a field where she says there was lots of pollen, and thinks this may be secondary to allergies, though wants to be sure she does not need antibiotics. She has not taken anything for symptom relief. Cough has been productive of grey phlegm. No blood in sputum. She was recently around her son who has been sick. She did receive a flu shot this year.   Anemia Patient has history of anemia, for which she was hospitalized and received a blood transfusion a couple months ago. She recently had a colonoscopy, which did not show source if blood loss. She has been taking iron pills and eating foods with high iron content. Her hemoglobin has continued to improve, though has not been measured in over a month. She is concerned that her hemoglobin may be dropping again and would like it rechecked today for peace of mind. Denies dizziness or weakness.   Review of Systems See HPI.     Objective:   Physical Exam  Constitutional: She is oriented to person, place, and time. She appears well-developed and well-nourished.  Coughing intermittently throughout encounter  HENT:  Head: Normocephalic and atraumatic.  Eyes: Conjunctivae and EOM are normal. Pupils are equal, round, and reactive to light. Right eye exhibits no discharge. Left eye exhibits no discharge.  Pulmonary/Chest: Effort normal and breath sounds normal. No respiratory distress. She has no wheezes. She has no rales.  Neurological: She is alert and oriented to person, place, and time.  Skin: Skin is warm and dry.  Psychiatric: She has a normal mood and affect. Her behavior is normal.      Assessment & Plan:  Anemia, iron deficiency Improving. Currently asymptomatic. Colonoscopy with  no signs of GI blood loss.  - Check CBC today - Will call patient with results   Cough Likely allergy-related vs viral. Lungs clear on exam. No difficulty breathing. No systemic signs of illness. Less concern for PNA at this time.  - Recommended honey, cough drops, and OTC cough syrup if desired  - Advised patient to avoid Sudafed given history of HTN - Return if symptoms not improved in one week   Adin Hector, MD PGY-1 [facility] Pager [phone]

## 2015-07-18 ENCOUNTER — Telehealth: Payer: Self-pay | Admitting: Internal Medicine

## 2015-07-18 NOTE — Telephone Encounter (Signed)
Spoke with patient regarding latest hemoglobin of 11.4. She was very pleased that her hemoglobin continues to improve. Patient voiced no other concerns at this time.

## 2015-07-27 ENCOUNTER — Other Ambulatory Visit: Payer: Self-pay | Admitting: Family Medicine

## 2015-08-20 ENCOUNTER — Other Ambulatory Visit: Payer: Self-pay | Admitting: Family Medicine

## 2015-09-04 ENCOUNTER — Encounter: Payer: Self-pay | Admitting: Family Medicine

## 2015-09-04 ENCOUNTER — Ambulatory Visit (INDEPENDENT_AMBULATORY_CARE_PROVIDER_SITE_OTHER): Payer: Medicare Other | Admitting: Family Medicine

## 2015-09-04 VITALS — BP 135/54 | HR 78 | Temp 98.3°F | Ht 61.0 in | Wt 171.0 lb

## 2015-09-04 DIAGNOSIS — I10 Essential (primary) hypertension: Secondary | ICD-10-CM | POA: Diagnosis not present

## 2015-09-04 DIAGNOSIS — D509 Iron deficiency anemia, unspecified: Secondary | ICD-10-CM

## 2015-09-04 DIAGNOSIS — F411 Generalized anxiety disorder: Secondary | ICD-10-CM

## 2015-09-04 LAB — CBC
HEMATOCRIT: 34.3 % — AB (ref 35.0–45.0)
HEMOGLOBIN: 11.5 g/dL — AB (ref 11.7–15.5)
MCH: 31.5 pg (ref 27.0–33.0)
MCHC: 33.5 g/dL (ref 32.0–36.0)
MCV: 94 fL (ref 80.0–100.0)
MPV: 9.9 fL (ref 7.5–12.5)
Platelets: 248 10*3/uL (ref 140–400)
RBC: 3.65 MIL/uL — ABNORMAL LOW (ref 3.80–5.10)
RDW: 12.7 % (ref 11.0–15.0)
WBC: 5.6 10*3/uL (ref 3.8–10.8)

## 2015-09-04 LAB — COMPREHENSIVE METABOLIC PANEL
ALT: 9 U/L (ref 6–29)
AST: 13 U/L (ref 10–35)
Albumin: 4.4 g/dL (ref 3.6–5.1)
Alkaline Phosphatase: 69 U/L (ref 33–130)
BUN: 26 mg/dL — AB (ref 7–25)
CHLORIDE: 101 mmol/L (ref 98–110)
CO2: 24 mmol/L (ref 20–31)
Calcium: 9.7 mg/dL (ref 8.6–10.4)
Creat: 1.32 mg/dL — ABNORMAL HIGH (ref 0.60–0.93)
GLUCOSE: 113 mg/dL — AB (ref 65–99)
POTASSIUM: 4.8 mmol/L (ref 3.5–5.3)
Sodium: 136 mmol/L (ref 135–146)
Total Bilirubin: 0.3 mg/dL (ref 0.2–1.2)
Total Protein: 7.2 g/dL (ref 6.1–8.1)

## 2015-09-04 MED ORDER — LISINOPRIL-HYDROCHLOROTHIAZIDE 20-25 MG PO TABS: 1.0000 | ORAL_TABLET | Freq: Every day | ORAL | Status: DC

## 2015-09-04 MED ORDER — ALPRAZOLAM 0.25 MG PO TABS: 0.2500 mg | ORAL_TABLET | Freq: Two times a day (BID) | ORAL | Status: DC | PRN

## 2015-09-04 NOTE — Progress Notes (Signed)
   Subjective:    Patient ID: Hannah [name], female    DOB: 12/25/1945, 70 y.o.   MRN: [id number]  HPI  Anemia She feels she is doing well. Taking iron once a day.  No gi bleeding or melena.  No lightheadness or dizzyness.  Has not started back exercising  Anxiety Reports she enjoys living by herself and does not want to go to stay with children.  Feels she maybe able to wean antianxiety medications after returns from trip to see her son  HYPERTENSION Disease Monitoring Home BP Monitoring (Severity) not checking Symptoms - Chest pain- no    Dyspnea- no Medications(Modifying factors) Compliance-  regularly. Lightheadedness-  no  Edema- no Timing - continuous  Duration - years ROS - See HPI  PMH Lab Review   POTASSIUM  Date Value Ref Range Status  05/09/2015 4.5 3.5 - 5.1 mmol/L Final   SODIUM  Date Value Ref Range Status  05/09/2015 140 135 - 145 mmol/L Final   CREAT  Date Value Ref Range Status  02/20/2015 1.22* 0.60 - 0.93 mg/dL Final   CREATININE, SER  Date Value Ref Range Status  05/09/2015 1.28* 0.44 - 1.00 mg/dL Final       Chief Complaint noted Review of Symptoms - see HPI PMH - Smoking status noted.   Vital Signs reviewed   Review of Systems     Objective:   Physical Exam  Alert interactive able to get up and down from exam table without assistance.  Able to walk around room without problems Heart - Regular rate and rhythm.  No murmurs, gallops or rubs.    Lungs:  Normal respiratory effort, chest expands symmetrically. Lungs are clear to auscultation, no crackles or wheezes. Extremities:  No cyanosis, edema, or deformity noted with good range of motion of all major joints.          Assessment & Plan:

## 2015-09-04 NOTE — Assessment & Plan Note (Addendum)
Stable.  We discussed weaning of her benzodiazepine next visit

## 2015-09-04 NOTE — Assessment & Plan Note (Signed)
Asymptomatic.  Will check cbc  

## 2015-09-04 NOTE — Patient Instructions (Signed)
Good to see you today!  Thanks for coming in.  I will call you if your tests are not good.  Otherwise I will send you a letter.  If you do not hear from me with in 2 weeks please call our office.     Come back in 3 months for a follow up of the anxiety and blood pressure

## 2015-09-04 NOTE — Assessment & Plan Note (Signed)
BP Readings from Last 3 Encounters:  09/04/15 135/54  07/12/15 148/67  05/13/15 139/62   Stable at goal

## 2015-09-05 ENCOUNTER — Encounter: Payer: Self-pay | Admitting: Family Medicine

## 2015-09-05 ENCOUNTER — Other Ambulatory Visit: Payer: Self-pay | Admitting: Family Medicine

## 2015-09-06 ENCOUNTER — Telehealth: Payer: Self-pay | Admitting: Student

## 2015-09-06 NOTE — Telephone Encounter (Signed)
Patient called for concern for right big toe pain. The pain started this AM when she awoke then progressively got more painful. She has not tried any thing for it thus far. Her daughter has gout and she is worried that she has gout. She denies trauma to the toe, cuts on it or fevers. She wishes to try OTC meds for now and try to wait until she can be seen in the office for evaluation. She was counseled to seek care at the ED or urgent care if her pain should get worse or she develop fevers. She expressed her understanding and acceptance

## 2015-09-11 ENCOUNTER — Encounter: Payer: Self-pay | Admitting: Family Medicine

## 2015-09-11 ENCOUNTER — Ambulatory Visit (INDEPENDENT_AMBULATORY_CARE_PROVIDER_SITE_OTHER): Payer: Medicare Other | Admitting: Family Medicine

## 2015-09-11 VITALS — BP 122/50 | HR 68 | Temp 98.4°F | Wt 171.8 lb

## 2015-09-11 DIAGNOSIS — M10072 Idiopathic gout, left ankle and foot: Secondary | ICD-10-CM

## 2015-09-11 DIAGNOSIS — M109 Gout, unspecified: Secondary | ICD-10-CM

## 2015-09-11 LAB — URIC ACID: URIC ACID, SERUM: 7.9 mg/dL — AB (ref 2.4–7.0)

## 2015-09-11 MED ORDER — COLCHICINE 0.6 MG PO TABS: 0.6000 mg | ORAL_TABLET | Freq: Two times a day (BID) | ORAL | Status: DC

## 2015-09-11 NOTE — Patient Instructions (Addendum)
Good to see you today!  Thanks for coming in.  I will call you if the blood tests are not good  Take colchicine one tab twice a day for the next 5 days.  You can take Tylenol with it for pain  Come back in 2-3 months  Call if you are feeling tired or shortness of breath

## 2015-09-11 NOTE — Assessment & Plan Note (Signed)
Presentation very suggestive of gout.  No signs of infection.  Will treat for gout and not use nsaid given her mild CKD.   Check urinc acid but not start prophylaxis unless has recurrence.  Had been eating lots of red meat to hep with her anemia

## 2015-09-11 NOTE — Progress Notes (Signed)
   Subjective:    Patient ID: Hannah [name], female    DOB: Aug 28, 1945, 70 y.o.   MRN: [id number]  HPI  EXTREMITY PAIN  Location: L great toe Pain started: 3 days ago Pain is: throbbing Severity: severe Medications tried: none does not like to take meds Recent trauma: no Similar pain previously: no  Symptoms Redness:yes Swelling:yes Fever: no Weakness: no Weight loss: no Rash: no   Fhx - her daughter has gout   Review of Symptoms - see HPI PMH - Smoking status noted.      Review of Systems     Objective:   Physical Exam  Left great toe.  MTP joint is hot swollen and tender but can move somewhat with increased pain.  No streaking or fluctuance DJD changes of fingers        Assessment & Plan:

## 2015-09-16 ENCOUNTER — Telehealth: Payer: Self-pay | Admitting: Family Medicine

## 2015-09-16 DIAGNOSIS — M109 Gout, unspecified: Secondary | ICD-10-CM

## 2015-09-16 MED ORDER — COLCHICINE 0.6 MG PO TABS: 0.6000 mg | ORAL_TABLET | Freq: Two times a day (BID) | ORAL | Status: DC

## 2015-09-16 NOTE — Telephone Encounter (Signed)
Pt. Called after hours line regarding recent diagnosis of acute gout. She has been taking the colchicine, but had questions about starting allopurinol. She says the pain is somewhat better, but foot is not completely better yet and she is having a hard time walking on it.   Recommendation:  - No allopurinol given acute gout.  - Repeat colchicine. Take 1.2mg  and then go to 0.6mg  BID.  - Call for SDA appt. In the am. May need prednisone for additional inflammation reduction.  - Discussed with pt. She is appreciative and understands the plan of action.   Paula Compton, MD Family Medicine -PGY 2

## 2015-09-16 NOTE — Addendum Note (Signed)
Addended by: Aquilla Hacker on: 09/16/2015 01:27 PM   Modules accepted: Orders

## 2015-09-16 NOTE — Telephone Encounter (Signed)
Called back after reviewing her kidney function. May not be wise to start with 1.2mg  of colchicine. Will continue with 0.6mg  BID. May benefit from prednisone if seen in the clinic and no additional complicating factors. Pt. Agreed to take only 0.6mg  BID.   CGM MD

## 2015-10-03 ENCOUNTER — Telehealth: Payer: Self-pay | Admitting: Family Medicine

## 2015-10-03 DIAGNOSIS — M109 Gout, unspecified: Secondary | ICD-10-CM

## 2015-10-03 MED ORDER — COLCHICINE 0.6 MG PO TABS: 0.6000 mg | ORAL_TABLET | Freq: Two times a day (BID) | ORAL | Status: DC

## 2015-10-03 MED ORDER — PREDNISONE 20 MG PO TABS: 40.0000 mg | ORAL_TABLET | Freq: Every day | ORAL | Status: DC

## 2015-10-03 NOTE — Telephone Encounter (Signed)
Patient states that the gout in her big toe is not better with the colchicine that Dr. Erin Hearing prescribed in May. Would like something stronger. Please advise.

## 2015-10-03 NOTE — Telephone Encounter (Signed)
Toe got better but is now worse  Out of colchicine for days No fever or streaking  Feels overall well  Seems consistent with gout without signs of infection or other arthritis   Will Restart colchicine Add prednsione  Follow up in office in 2-4 weeks Call if not improving or fever or streaking

## 2015-11-15 ENCOUNTER — Other Ambulatory Visit: Payer: Self-pay | Admitting: Family Medicine

## 2015-11-20 ENCOUNTER — Ambulatory Visit (INDEPENDENT_AMBULATORY_CARE_PROVIDER_SITE_OTHER): Payer: Medicare Other | Admitting: Family Medicine

## 2015-11-20 ENCOUNTER — Encounter: Payer: Self-pay | Admitting: Family Medicine

## 2015-11-20 VITALS — BP 140/59 | HR 73 | Temp 98.4°F | Ht 61.0 in | Wt 173.0 lb

## 2015-11-20 DIAGNOSIS — D509 Iron deficiency anemia, unspecified: Secondary | ICD-10-CM | POA: Diagnosis not present

## 2015-11-20 DIAGNOSIS — M109 Gout, unspecified: Secondary | ICD-10-CM

## 2015-11-20 DIAGNOSIS — M10072 Idiopathic gout, left ankle and foot: Secondary | ICD-10-CM | POA: Diagnosis not present

## 2015-11-20 DIAGNOSIS — I10 Essential (primary) hypertension: Secondary | ICD-10-CM

## 2015-11-20 DIAGNOSIS — F411 Generalized anxiety disorder: Secondary | ICD-10-CM

## 2015-11-20 DIAGNOSIS — Z23 Encounter for immunization: Secondary | ICD-10-CM | POA: Diagnosis not present

## 2015-11-20 LAB — CBC
HCT: 36.3 % (ref 35.0–45.0)
Hemoglobin: 11.9 g/dL (ref 11.7–15.5)
MCH: 31.7 pg (ref 27.0–33.0)
MCHC: 32.8 g/dL (ref 32.0–36.0)
MCV: 96.8 fL (ref 80.0–100.0)
MPV: 10.5 fL (ref 7.5–12.5)
Platelets: 241 K/uL (ref 140–400)
RBC: 3.75 MIL/uL — ABNORMAL LOW (ref 3.80–5.10)
RDW: 12.8 % (ref 11.0–15.0)
WBC: 5.3 K/uL (ref 3.8–10.8)

## 2015-11-20 MED ORDER — ZOSTER VACCINE LIVE 19400 UNT/0.65ML ~~LOC~~ SUSR: 0.6500 mL | Freq: Once | SUBCUTANEOUS | 0 refills | Status: DC

## 2015-11-20 MED ORDER — ZOSTER VACCINE LIVE 19400 UNT/0.65ML ~~LOC~~ SUSR: 0.6500 mL | Freq: Once | SUBCUTANEOUS | 0 refills | Status: AC

## 2015-11-20 MED ORDER — ALPRAZOLAM 0.25 MG PO TABS: 0.2500 mg | ORAL_TABLET | Freq: Every day | ORAL | 5 refills | Status: DC | PRN

## 2015-11-20 NOTE — Assessment & Plan Note (Signed)
BP Readings from Last 3 Encounters:  11/20/15 (!) 140/59  09/11/15 (!) 122/50  09/04/15 (!) 135/54   At goal continue current medications

## 2015-11-20 NOTE — Assessment & Plan Note (Signed)
Stable.  Will cut down on xanax dose

## 2015-11-20 NOTE — Assessment & Plan Note (Signed)
Seems stable check labs  

## 2015-11-20 NOTE — Assessment & Plan Note (Signed)
Stable not more attacks has cut back on meats.  No prophylaxis needed currently

## 2015-11-20 NOTE — Patient Instructions (Signed)
Good to see you today!  Thanks for coming in.  I will call you if your tests are not good.  Otherwise I will send you a letter.  If you do not hear from me with in 2 weeks please call our office.     Take the xanax one daily as needed or 1/2 twice a day as needed  Get your shingles shot  Come back in 6 months for a checkup

## 2015-11-20 NOTE — Progress Notes (Signed)
Subjective  Patient is presenting with the following illnesses  Anemia Feels well no shortness of breath or edema or bleeding.  Taking iron daily.  Exercising regularly  Gout No further attacks.  No pain no redness.  Cut back on red meat  HYPERTENSION Disease Monitoring  Home BP Monitoring (Severity) not checking Symptoms - Chest pain- no    Dyspnea- no Medications (Modifying factors) Compliance-  daily. Lightheadedness-  no  Edema- no Timing - continuous  Duration - years ROS - See HPI  PMH Lab Review   Potassium  Date Value Ref Range Status  09/04/2015 4.8 3.5 - 5.3 mmol/L Final   Sodium  Date Value Ref Range Status  09/04/2015 136 135 - 146 mmol/L Final   Creat  Date Value Ref Range Status  09/04/2015 1.32 (H) 0.60 - 0.93 mg/dL Final      Chief Complaint noted Review of Symptoms - see HPI PMH - Smoking status noted.     Objective Vital Signs reviewed Alert nad Heart - Regular rate and rhythm.  No murmurs, gallops or rubs.    Lungs:  Normal respiratory effort, chest expands symmetrically. Lungs are clear to auscultation, no crackles or wheezes. Extremities:  No cyanosis, edema, or deformity noted with good range of motion of all major joints.       Assessments/Plans  No problem-specific Assessment & Plan notes found for this encounter.   See Encounter view if individual problem A/Ps not visible See after visit summary for details of patient instuctions

## 2015-11-21 ENCOUNTER — Other Ambulatory Visit: Payer: Self-pay | Admitting: Family Medicine

## 2015-11-22 ENCOUNTER — Encounter: Payer: Self-pay | Admitting: Family Medicine

## 2015-12-31 ENCOUNTER — Encounter: Payer: Self-pay | Admitting: *Deleted

## 2015-12-31 ENCOUNTER — Ambulatory Visit (INDEPENDENT_AMBULATORY_CARE_PROVIDER_SITE_OTHER): Payer: Medicare Other | Admitting: *Deleted

## 2015-12-31 VITALS — BP 120/48 | HR 72 | Temp 97.8°F | Ht 62.0 in | Wt 164.6 lb

## 2015-12-31 DIAGNOSIS — Z23 Encounter for immunization: Secondary | ICD-10-CM

## 2015-12-31 DIAGNOSIS — Z0000 Encounter for general adult medical examination without abnormal findings: Secondary | ICD-10-CM

## 2015-12-31 NOTE — Progress Notes (Signed)
Subjective:   Hannah [name] is a 70 y.o. female who presents for Medicare Annual (Subsequent) preventive examination.  Cardiac Risk Factors include: obesity (BMI >30kg/m2)     Objective:     Vitals: BP (!) 102/46 (BP Location: Left Arm, Patient Position: Sitting, Cuff Size: Normal)   Pulse 72   Temp 97.8 F (36.6 C) (Oral)   Ht 5\' 2"  (1.575 m)   Wt 164 lb 9.6 oz (74.7 kg)   SpO2 97%   BMI 30.11 kg/m   Body mass index is 30.11 kg/m.  BP recheck 120/48  left arm manually with adult cuff.  Patient denies feeling lightheaded or dizzy.  Tobacco History  Smoking Status  . Former Smoker  . Packs/day: 0.30  . Years: 8.00  . Types: Cigarettes  . Quit date: 05/30/2004  Smokeless Tobacco  . Never Used     Counseling given: Yes   Past Medical History:  Diagnosis Date  . Anemia   . Anxiety   . Arthritis    "hands" (05/09/2015)  . GERD (gastroesophageal reflux disease)   . High cholesterol   . History of blood transfusion 1960s; 05/08/2015   "when I had my kids; low HgB"  . Hypertension   . Osteoarthritis   . Pneumonia 05/2004   Past Surgical History:  Procedure Laterality Date  . CARDIAC CATHETERIZATION  05/2004  . COLONOSCOPY  07/2002   Archie Endo 09/02/2010  . ESOPHAGOGASTRODUODENOSCOPY  07/2002   w/biopsies/notes 09/02/2010   Family History  Problem Relation Age of Onset  . Heart disease Mother     died at age 45  . Breast cancer Sister   . Cancer Sister     Breast  . Hypertension Father   . Hypertension Sister    History  Sexual Activity  . Sexual activity: No    Outpatient Encounter Prescriptions as of 12/31/2015  Medication Sig  . ALPRAZolam (XANAX) 0.25 MG tablet Take 1 tablet (0.25 mg total) by mouth daily as needed for anxiety. for anxiety  . aspirin 81 MG tablet Take 81 mg by mouth daily.  . carvedilol (COREG) 12.5 MG tablet TAKE 1 TABLET BY MOUTH TWICE DAILY WITH A MEAL  . ferrous sulfate (FERROUSUL) 325 (65 FE) MG tablet Take 1 tablet (325 mg  total) by mouth daily with breakfast.  . lisinopril-hydrochlorothiazide (PRINZIDE,ZESTORETIC) 20-25 MG tablet TAKE 1 TABLET BY MOUTH DAILY  . omeprazole (PRILOSEC) 20 MG capsule TAKE 1 CAPSULE BY MOUTH DAILY AS NEEDED   No facility-administered encounter medications on file as of 12/31/2015.     Activities of Daily Living In your present state of health, do you have any difficulty performing the following activities: 12/31/2015 05/09/2015  Hearing? N N  Vision? N N  Difficulty concentrating or making decisions? N N  Walking or climbing stairs? N N  Dressing or bathing? N N  Doing errands, shopping? N N  Preparing Food and eating ? N -  Using the Toilet? N -  In the past six months, have you accidently leaked urine? N -  Do you have problems with loss of bowel control? N -  Managing your Medications? N -  Managing your Finances? N -  Housekeeping or managing your Housekeeping? N -  Some recent data might be hidden  Home Safety:  My home has a working smoke alarm:  Yes X 5           My home throw rugs have been fastened down to the floor  or removed:  Removed I have non-slip mats in the bathtub and shower:  Yes         All my home's stairs have railings or bannisters: One level home with 2 outside stairs with railings and ramp          My home's floors, stairs and hallways are free from clutter, wires and cords:  Yes        Patient Care Team: [name], MD as PCP - General [name]      Assessment:     Exercise Activities and Dietary recommendations Current Exercise Habits: Structured exercise class ([employer]), Type of exercise: strength training/weights;walking (Chair exercises), Time (Minutes): 30, Frequency (Times/Week): 7, Weekly Exercise (Minutes/Week): 210, Intensity: Moderate  Goals    . Increase physical activity- join [employer] silver sneakers    . LDL CALC < 100    . Weight < 155 lb (70.3 kg)          5% weight loss       Fall Risk Fall Risk   12/31/2015 11/20/2015 09/11/2015 09/04/2015 07/12/2015  Falls in the past year? No No No No No   Depression Screen PHQ 2/9 Scores 12/31/2015 11/20/2015 09/11/2015 09/04/2015  PHQ - 2 Score 0 0 0 0  Exception Documentation - - - -     Cognitive Testing MMSE - Mini Mental State Exam 09/06/2013  Orientation to time 5  Orientation to Place 5  Registration 3  Attention/ Calculation 5  Recall 3  Language- name 2 objects 2  Language- repeat 1  Language- follow 3 step command 3  Language- read & follow direction 1  Write a sentence 1  Copy design 1  Total score 30   Mini-Cog Failed with score 2/5  TUG Test:  Done in 11 seconds. Patient used both hands to push out of chair. Patient was wearing flip flops. States she usually only wears well-fitting tennis shoes but they were wet this AM due to the rain.  Immunization History  Administered Date(s) Administered  . Influenza Split 01/21/2011, 02/01/2012, 02/09/2013  . Influenza Whole 02/08/2009, 02/03/2010  . Influenza,inj,Quad PF,36+ Mos 02/21/2014, 02/11/2015  . Pneumococcal Conjugate-13 02/20/2015  . Pneumococcal Polysaccharide-23 04/01/2009  . Tdap 06/03/2011   Screening Tests Health Maintenance  Topic Date Due  . ZOSTAVAX  05/20/2003  . COLONOSCOPY  08/17/2012  . INFLUENZA VACCINE  11/19/2015  . MAMMOGRAM  02/14/2016  . DTaP/Tdap/Td (2 - Td) 06/02/2021  . TETANUS/TDAP  06/02/2021  . DEXA SCAN  Completed  . Hepatitis C Screening  Completed  . PNA vac Low Risk Adult  Completed  Patient states she will see if insurance will cover zostavax. When she went to local pharmacy to receive this vaccine it would have been $94 co-pay which she cannot afford. States she had colonoscopy and endoscopy at [facility] with Dr. Gloriajean Dell this year. Flu vaccine administered today [facility], contact info given.    Plan:   Patient to schedule appt with PCP to discuss cholesterol.  During the course of the  visit the patient was educated and counseled about the following appropriate screening and preventive services:   Vaccines to include Pneumoccal, Influenza, Td, Zostavax  Cardiovascular Disease  Colorectal cancer screening  Bone density screening  Diabetes screening  Mammography/PAP  Nutrition counseling   Patient Instructions (the written plan) was given to the patient.   Velora Heckler, RN  12/31/2015

## 2015-12-31 NOTE — Patient Instructions (Signed)
Fat and Cholesterol Restricted Diet Getting too much fat and cholesterol in your diet may cause health problems. Following this diet helps keep your fat and cholesterol at normal levels. This can keep you from getting sick. WHAT TYPES OF FAT SHOULD I CHOOSE?  Choose monosaturated and polyunsaturated fats. These are found in foods such as olive oil, canola oil, flaxseeds, walnuts, almonds, and seeds.  Eat more omega-3 fats. Good choices include salmon, mackerel, sardines, tuna, flaxseed oil, and ground flaxseeds.  Limit saturated fats. These are in animal products such as meats, butter, and cream. They can also be in plant products such as palm oil, palm kernel oil, and coconut oil.   Avoid foods with partially hydrogenated oils in them. These contain trans fats. Examples of foods that have trans fats are stick margarine, some tub margarines, cookies, crackers, and other baked goods. WHAT GENERAL GUIDELINES DO I NEED TO FOLLOW?   Check food labels. Look for the words "trans fat" and "saturated fat."  When preparing a meal:  Fill half of your plate with vegetables and green salads.  Fill one fourth of your plate with whole grains. Look for the word "whole" as the first word in the ingredient list.  Fill one fourth of your plate with lean protein foods.  Limit fruit to two servings a day. Choose fruit instead of juice.  Eat more foods with soluble fiber. Examples of foods with this type of fiber are apples, broccoli, carrots, beans, peas, and barley. Try to get 20-30 g (grams) of fiber per day.  Eat more home-cooked foods. Eat less at restaurants and buffets.  Limit or avoid alcohol.  Limit foods high in starch and sugar.  Limit fried foods.  Cook foods without frying them. Baking, boiling, grilling, and broiling are all great options.  Lose weight if you are overweight. Losing even a small amount of weight can help your overall health. It can also help prevent diseases such as  diabetes and heart disease. WHAT FOODS CAN I EAT? Grains Whole grains, such as whole wheat or whole grain breads, crackers, cereals, and pasta. Unsweetened oatmeal, bulgur, barley, quinoa, or brown rice. Corn or whole wheat flour tortillas. Vegetables Fresh or frozen vegetables (raw, steamed, roasted, or grilled). Green salads. Fruits All fresh, canned (in natural juice), or frozen fruits. Meat and Other Protein Products Ground beef (85% or leaner), grass-fed beef, or beef trimmed of fat. Skinless chicken or [place]. Ground chicken or [place]. Pork trimmed of fat. All fish and seafood. Eggs. Dried beans, peas, or lentils. Unsalted nuts or seeds. Unsalted canned or dry beans. Dairy Low-fat dairy products, such as skim or 1% milk, 2% or reduced-fat cheeses, low-fat ricotta or cottage cheese, or plain low-fat yogurt. Fats and Oils Tub margarines without trans fats. Light or reduced-fat mayonnaise and salad dressings. Avocado. Olive, canola, sesame, or safflower oils. Natural peanut or almond butter (choose ones without added sugar and oil). The items listed above may not be a complete list of recommended foods or beverages. Contact your dietitian for more options. WHAT FOODS ARE NOT RECOMMENDED? Grains White bread. White pasta. White rice. Cornbread. Bagels, pastries, and croissants. Crackers that contain trans fat. Vegetables White potatoes. Corn. Creamed or fried vegetables. Vegetables in a cheese sauce. Fruits Dried fruits. Canned fruit in light or heavy syrup. Fruit juice. Meat and Other Protein Products Fatty cuts of meat. Ribs, chicken wings, bacon, sausage, bologna, salami, chitterlings, fatback, hot dogs, bratwurst, and packaged luncheon meats. Liver and organ meats.  Dairy Whole or 2% milk, cream, half-and-half, and cream cheese. Whole milk cheeses. Whole-fat or sweetened yogurt. Full-fat cheeses. Nondairy creamers and whipped toppings. Processed cheese, cheese spreads, or cheese  curds. Sweets and Desserts Corn syrup, sugars, honey, and molasses. Candy. Jam and jelly. Syrup. Sweetened cereals. Cookies, pies, cakes, donuts, muffins, and ice cream. Fats and Oils Butter, stick margarine, lard, shortening, ghee, or bacon fat. Coconut, palm kernel, or palm oils. Beverages Alcohol. Sweetened drinks (such as sodas, lemonade, and fruit drinks or punches). The items listed above may not be a complete list of foods and beverages to avoid. Contact your dietitian for more information.   This information is not intended to replace advice given to you by your health care provider. Make sure you discuss any questions you have with your health care provider.   Document Released: 10/06/2011 Document Revised: 04/27/2014 Document Reviewed: 07/06/2013 Elsevier Interactive Patient Education 2016 [place] in the Home  Falls can cause injuries. They can happen to people of all ages. There are many things you can do to make your home safe and to help prevent falls.  WHAT CAN I DO ON THE OUTSIDE OF MY HOME?  Regularly fix the edges of walkways and driveways and fix any cracks.  Remove anything that might make you trip as you walk through a door, such as a raised step or threshold.  Trim any bushes or trees on the path to your home.  Use bright outdoor lighting.  Clear any walking paths of anything that might make someone trip, such as rocks or tools.  Regularly check to see if handrails are loose or broken. Make sure that both sides of any steps have handrails.  Any raised decks and porches should have guardrails on the edges.  Have any leaves, snow, or ice cleared regularly.  Use sand or salt on walking paths during winter.  Clean up any spills in your garage right away. This includes oil or grease spills. WHAT CAN I DO IN THE BATHROOM?   Use night lights.  Install grab bars by the toilet and in the tub and shower. Do not use towel bars as grab  bars.  Use non-skid mats or decals in the tub or shower.  If you need to sit down in the shower, use a plastic, non-slip stool.  Keep the floor dry. Clean up any water that spills on the floor as soon as it happens.  Remove soap buildup in the tub or shower regularly.  Attach bath mats securely with double-sided non-slip rug tape.  Do not have throw rugs and other things on the floor that can make you trip. WHAT CAN I DO IN THE BEDROOM?  Use night lights.  Make sure that you have a light by your bed that is easy to reach.  Do not use any sheets or blankets that are too big for your bed. They should not hang down onto the floor.  Have a firm chair that has side arms. You can use this for support while you get dressed.  Do not have throw rugs and other things on the floor that can make you trip. WHAT CAN I DO IN THE KITCHEN?  Clean up any spills right away.  Avoid walking on wet floors.  Keep items that you use a lot in easy-to-reach places.  If you need to reach something above you, use a strong step stool that has a grab bar.  Keep electrical cords out of  the way.  Do not use floor polish or wax that makes floors slippery. If you must use wax, use non-skid floor wax.  Do not have throw rugs and other things on the floor that can make you trip. WHAT CAN I DO WITH MY STAIRS?  Do not leave any items on the stairs.  Make sure that there are handrails on both sides of the stairs and use them. Fix handrails that are broken or loose. Make sure that handrails are as long as the stairways.  Check any carpeting to make sure that it is firmly attached to the stairs. Fix any carpet that is loose or worn.  Avoid having throw rugs at the top or bottom of the stairs. If you do have throw rugs, attach them to the floor with carpet tape.  Make sure that you have a light switch at the top of the stairs and the bottom of the stairs. If you do not have them, ask someone to add them for  you. WHAT ELSE CAN I DO TO HELP PREVENT FALLS?  Wear shoes that:  Do not have high heels.  Have rubber bottoms.  Are comfortable and fit you well.  Are closed at the toe. Do not wear sandals.  If you use a stepladder:  Make sure that it is fully opened. Do not climb a closed stepladder.  Make sure that both sides of the stepladder are locked into place.  Ask someone to hold it for you, if possible.  Clearly mark and make sure that you can see:  Any grab bars or handrails.  First and last steps.  Where the edge of each step is.  Use tools that help you move around (mobility aids) if they are needed. These include:  Canes.  Walkers.  Scooters.  Crutches.  Turn on the lights when you go into a dark area. Replace any light bulbs as soon as they burn out.  Set up your furniture so you have a clear path. Avoid moving your furniture around.  If any of your floors are uneven, fix them.  If there are any pets around you, be aware of where they are.  Review your medicines with your doctor. Some medicines can make you feel dizzy. This can increase your chance of falling. Ask your doctor what other things that you can do to help prevent falls.   This information is not intended to replace advice given to you by your health care provider. Make sure you discuss any questions you have with your health care provider.   Document Released: 01/31/2009 Document Revised: 08/21/2014 Document Reviewed: 05/11/2014 Elsevier Interactive Patient Education 2016 [place] Maintenance, Female Adopting a healthy lifestyle and getting preventive care can go a long way to promote health and wellness. Talk with your health care provider about what schedule of regular examinations is right for you. This is a good chance for you to check in with your provider about disease prevention and staying healthy. In between checkups, there are plenty of things you can do on your own. Experts  have done a lot of research about which lifestyle changes and preventive measures are most likely to keep you healthy. Ask your health care provider for more information. WEIGHT AND DIET  Eat a healthy diet  Be sure to include plenty of vegetables, fruits, low-fat dairy products, and lean protein.  Do not eat a lot of foods high in solid fats, added sugars, or salt.  Get regular exercise.  This is one of the most important things you can do for your health.  Most adults should exercise for at least 150 minutes each week. The exercise should increase your heart rate and make you sweat (moderate-intensity exercise).  Most adults should also do strengthening exercises at least twice a week. This is in addition to the moderate-intensity exercise.  Maintain a healthy weight  Body mass index (BMI) is a measurement that can be used to identify possible weight problems. It estimates body fat based on height and weight. Your health care provider can help determine your BMI and help you achieve or maintain a healthy weight.  For females 62 years of age and older:   A BMI below 18.5 is considered underweight.  A BMI of 18.5 to 24.9 is normal.  A BMI of 25 to 29.9 is considered overweight.  A BMI of 30 and above is considered obese.  Watch levels of cholesterol and blood lipids  You should start having your blood tested for lipids and cholesterol at 72 years of age, then have this test every 5 years.  You may need to have your cholesterol levels checked more often if:  Your lipid or cholesterol levels are high.  You are older than 72 years of age.  You are at high risk for heart disease.  CANCER SCREENING   Lung Cancer  Lung cancer screening is recommended for adults 46-3 years old who are at high risk for lung cancer because of a history of smoking.  A yearly low-dose CT scan of the lungs is recommended for people who:  Currently smoke.  Have quit within the past 15  years.  Have at least a 30-pack-year history of smoking. A pack year is smoking an average of one pack of cigarettes a day for 1 year.  Yearly screening should continue until it has been 15 years since you quit.  Yearly screening should stop if you develop a health problem that would prevent you from having lung cancer treatment.  Breast Cancer  Practice breast self-awareness. This means understanding how your breasts normally appear and feel.  It also means doing regular breast self-exams. Let your health care provider know about any changes, no matter how small.  If you are in your 20s or 30s, you should have a clinical breast exam (CBE) by a health care provider every 1-3 years as part of a regular health exam.  If you are 19 or older, have a CBE every year. Also consider having a breast X-ray (mammogram) every year.  If you have a family history of breast cancer, talk to your health care provider about genetic screening.  If you are at high risk for breast cancer, talk to your health care provider about having an MRI and a mammogram every year.  Breast cancer gene (BRCA) assessment is recommended for women who have family members with BRCA-related cancers. BRCA-related cancers include:  Breast.  Ovarian.  Tubal.  Peritoneal cancers.  Results of the assessment will determine the need for genetic counseling and BRCA1 and BRCA2 testing. Cervical Cancer Your health care provider may recommend that you be screened regularly for cancer of the pelvic organs (ovaries, uterus, and vagina). This screening involves a pelvic examination, including checking for microscopic changes to the surface of your cervix (Pap test). You may be encouraged to have this screening done every 3 years, beginning at age 57.  For women ages 47-65, health care providers may recommend pelvic exams and Pap testing every  3 years, or they may recommend the Pap and pelvic exam, combined with testing for human  papilloma virus (HPV), every 5 years. Some types of HPV increase your risk of cervical cancer. Testing for HPV may also be done on women of any age with unclear Pap test results.  Other health care providers may not recommend any screening for nonpregnant women who are considered low risk for pelvic cancer and who do not have symptoms. Ask your health care provider if a screening pelvic exam is right for you.  If you have had past treatment for cervical cancer or a condition that could lead to cancer, you need Pap tests and screening for cancer for at least 20 years after your treatment. If Pap tests have been discontinued, your risk factors (such as having a new sexual partner) need to be reassessed to determine if screening should resume. Some women have medical problems that increase the chance of getting cervical cancer. In these cases, your health care provider may recommend more frequent screening and Pap tests. Colorectal Cancer  This type of cancer can be detected and often prevented.  Routine colorectal cancer screening usually begins at 72 years of age and continues through 72 years of age.  Your health care provider may recommend screening at an earlier age if you have risk factors for colon cancer.  Your health care provider may also recommend using home test kits to check for hidden blood in the stool.  A small camera at the end of a tube can be used to examine your colon directly (sigmoidoscopy or colonoscopy). This is done to check for the earliest forms of colorectal cancer.  Routine screening usually begins at age 23.  Direct examination of the colon should be repeated every 5-10 years through 72 years of age. However, you may need to be screened more often if early forms of precancerous polyps or small growths are found. Skin Cancer  Check your skin from head to toe regularly.  Tell your health care provider about any new moles or changes in moles, especially if there is a  change in a mole's shape or color.  Also tell your health care provider if you have a mole that is larger than the size of a pencil eraser.  Always use sunscreen. Apply sunscreen liberally and repeatedly throughout the day.  Protect yourself by wearing long sleeves, pants, a wide-brimmed hat, and sunglasses whenever you are outside. HEART DISEASE, DIABETES, AND HIGH BLOOD PRESSURE   High blood pressure causes heart disease and increases the risk of stroke. High blood pressure is more likely to develop in:  People who have blood pressure in the high end of the normal range (130-139/85-89 mm Hg).  People who are overweight or obese.  People who are African American.  If you are 71-65 years of age, have your blood pressure checked every 3-5 years. If you are 69 years of age or older, have your blood pressure checked every year. You should have your blood pressure measured twice--once when you are at a hospital or clinic, and once when you are not at a hospital or clinic. Record the average of the two measurements. To check your blood pressure when you are not at a hospital or clinic, you can use:  An automated blood pressure machine at a pharmacy.  A home blood pressure monitor.  If you are between 68 years and 84 years old, ask your health care provider if you should take aspirin to prevent strokes.  Have regular diabetes screenings. This involves taking a blood sample to check your fasting blood sugar level.  If you are at a normal weight and have a low risk for diabetes, have this test once every three years after 72 years of age.  If you are overweight and have a high risk for diabetes, consider being tested at a younger age or more often. PREVENTING INFECTION  Hepatitis B  If you have a higher risk for hepatitis B, you should be screened for this virus. You are considered at high risk for hepatitis B if:  You were born in a country where hepatitis B is common. Ask your health  care provider which countries are considered high risk.  Your parents were born in a high-risk country, and you have not been immunized against hepatitis B (hepatitis B vaccine).  You have HIV or AIDS.  You use needles to inject street drugs.  You live with someone who has hepatitis B.  You have had sex with someone who has hepatitis B.  You get hemodialysis treatment.  You take certain medicines for conditions, including cancer, organ transplantation, and autoimmune conditions. Hepatitis C  Blood testing is recommended for:  Everyone born from [birth year] through 1967.  Anyone with known risk factors for hepatitis C. Sexually transmitted infections (STIs)  You should be screened for sexually transmitted infections (STIs) including gonorrhea and chlamydia if:  You are sexually active and are younger than 72 years of age.  You are older than 72 years of age and your health care provider tells you that you are at risk for this type of infection.  Your sexual activity has changed since you were last screened and you are at an increased risk for chlamydia or gonorrhea. Ask your health care provider if you are at risk.  If you do not have HIV, but are at risk, it may be recommended that you take a prescription medicine daily to prevent HIV infection. This is called pre-exposure prophylaxis (PrEP). You are considered at risk if:  You are sexually active and do not regularly use condoms or know the HIV status of your partner(s).  You take drugs by injection.  You are sexually active with a partner who has HIV. Talk with your health care provider about whether you are at high risk of being infected with HIV. If you choose to begin PrEP, you should first be tested for HIV. You should then be tested every 3 months for as long as you are taking PrEP.  PREGNANCY   If you are premenopausal and you may become pregnant, ask your health care provider about preconception counseling.  If you may  become pregnant, take 400 to 800 micrograms (mcg) of folic acid every day.  If you want to prevent pregnancy, talk to your health care provider about birth control (contraception). OSTEOPOROSIS AND MENOPAUSE   Osteoporosis is a disease in which the bones lose minerals and strength with aging. This can result in serious bone fractures. Your risk for osteoporosis can be identified using a bone density scan.  If you are 31 years of age or older, or if you are at risk for osteoporosis and fractures, ask your health care provider if you should be screened.  Ask your health care provider whether you should take a calcium or vitamin D supplement to lower your risk for osteoporosis.  Menopause may have certain physical symptoms and risks.  Hormone replacement therapy may reduce some of these symptoms and risks. Talk to  your health care provider about whether hormone replacement therapy is right for you.  HOME CARE INSTRUCTIONS   Schedule regular health, dental, and eye exams.  Stay current with your immunizations.   Do not use any tobacco products including cigarettes, chewing tobacco, or electronic cigarettes.  If you are pregnant, do not drink alcohol.  If you are breastfeeding, limit how much and how often you drink alcohol.  Limit alcohol intake to no more than 1 drink per day for nonpregnant women. One drink equals 12 ounces of beer, 5 ounces of wine, or 1 ounces of hard liquor.  Do not use street drugs.  Do not share needles.  Ask your health care provider for help if you need support or information about quitting drugs.  Tell your health care provider if you often feel depressed.  Tell your health care provider if you have ever been abused or do not feel safe at home.   This information is not intended to replace advice given to you by your health care provider. Make sure you discuss any questions you have with your health care provider.   Document Released: 10/20/2010  Document Revised: 04/27/2014 Document Reviewed: 03/08/2013 Elsevier Interactive Patient Education 2016 [employer].   Hearing Loss Hearing loss is a partial or total loss of the ability to hear. This can be temporary or permanent, and it can happen in one or both ears. Hearing loss may be referred to as deafness. Medical care is necessary to treat hearing loss properly and to prevent the condition from getting worse. Your hearing may partially or completely come back, depending on what caused your hearing loss and how severe it is. In some cases, hearing loss is permanent. CAUSES Common causes of hearing loss include:   Too much wax in the ear canal.   Infection of the ear canal or middle ear.   Fluid in the middle ear.   Injury to the ear or surrounding area.   An object stuck in the ear.   Prolonged exposure to loud sounds, such as music.  Less common causes of hearing loss include:   Tumors in the ear.   Viral or bacterial infections, such as meningitis.   A hole in the eardrum (perforated eardrum).  Problems with the hearing nerve that sends signals between the brain and the ear.  Certain medicines.  SYMPTOMS  Symptoms of this condition may include:  Difficulty telling the difference between sounds.  Difficulty following a conversation when there is background noise.  Lack of response to sounds in your environment. This may be most noticeable when you do not respond to startling sounds.  Needing to turn up the volume on the television, radio, etc.  Ringing in the ears.  Dizziness.  Pain in the ears. DIAGNOSIS This condition is diagnosed based on a physical exam and a hearing test (audiometry). The audiometry test will be performed by a hearing specialist (audiologist). You may also be referred to an ear, nose, and throat (ENT) specialist (otolaryngologist).  TREATMENT Treatment for recent onset of hearing loss may include:   Ear wax removal.    Being prescribed medicines to prevent infection (antibiotics).   Being prescribed medicines to reduce inflammation (corticosteroids).  HOME CARE INSTRUCTIONS  If you were prescribed an antibiotic medicine, take it as told by your health care provider. Do not stop taking the antibiotic even if you start to feel better.  Take over-the-counter and prescription medicines only as told by your health care provider.  Avoid loud noises.   Return to your normal activities as told by your health care provider. Ask your health care provider what activities are safe for you.  Keep all follow-up visits as told by your health care provider. This is important. SEEK MEDICAL CARE IF:   You feel dizzy.   You develop new symptoms.   You vomit or feel nauseous.   You have a fever.  SEEK IMMEDIATE MEDICAL CARE IF:  You develop sudden changes in your vision.   You have severe ear pain.   You have new or increased weakness.  You have a severe headache.   This information is not intended to replace advice given to you by your health care provider. Make sure you discuss any questions you have with your health care provider.   Document Released: 04/06/2005 Document Revised: 12/26/2014 Document Reviewed: 08/22/2014 Elsevier Interactive Patient Education [employer].

## 2016-01-04 ENCOUNTER — Other Ambulatory Visit: Payer: Self-pay | Admitting: Family Medicine

## 2016-01-16 ENCOUNTER — Encounter: Payer: Self-pay | Admitting: *Deleted

## 2016-01-16 NOTE — Progress Notes (Signed)
I have reviewed this visit and discussed with [name], RN, BSN, and agree with her documentation [name]

## 2016-01-20 ENCOUNTER — Other Ambulatory Visit: Payer: Self-pay | Admitting: Family Medicine

## 2016-01-23 ENCOUNTER — Other Ambulatory Visit: Payer: Self-pay | Admitting: Family Medicine

## 2016-01-27 ENCOUNTER — Other Ambulatory Visit: Payer: Self-pay | Admitting: Family Medicine

## 2016-01-27 DIAGNOSIS — Z1231 Encounter for screening mammogram for malignant neoplasm of breast: Secondary | ICD-10-CM

## 2016-02-06 ENCOUNTER — Ambulatory Visit
Admission: RE | Admit: 2016-02-06 | Discharge: 2016-02-06 | Disposition: A | Discharge status: Home / Self Care | Payer: Medicare Other | Source: Ambulatory Visit | Attending: Family Medicine | Admitting: Family Medicine

## 2016-02-06 DIAGNOSIS — Z1231 Encounter for screening mammogram for malignant neoplasm of breast: Secondary | ICD-10-CM

## 2016-02-07 ENCOUNTER — Other Ambulatory Visit: Payer: Self-pay | Admitting: Family Medicine

## 2016-02-11 ENCOUNTER — Telehealth: Payer: Self-pay | Admitting: Family Medicine

## 2016-02-11 MED ORDER — ALPRAZOLAM 0.25 MG PO TABS: 0.2500 mg | ORAL_TABLET | Freq: Two times a day (BID) | ORAL | 0 refills | Status: DC | PRN

## 2016-02-11 NOTE — Telephone Encounter (Signed)
New rx called into pharmacy, old rx discontinued.

## 2016-02-11 NOTE — Telephone Encounter (Signed)
Please call in Rx for xanax  Let pharmacy know that this is a new Rx and to cancel prior Rx for one a day and any refills for this  Thanks  [place]

## 2016-02-11 NOTE — Telephone Encounter (Signed)
At her last appt, dr Erin Hearing wrote her a RX for alprazolam for only 30 pills. She takes 2 pills per day.  She needs 60 pills. She pays $12 for 30 and pays $12 for 60.  She is on a tight budget.  She doesn't understand why he wrote the Rx for 30. [facility] on [address]

## 2016-03-06 ENCOUNTER — Other Ambulatory Visit: Payer: Self-pay | Admitting: Family Medicine

## 2016-03-06 MED ORDER — ALPRAZOLAM 0.25 MG PO TABS: 0.2500 mg | ORAL_TABLET | Freq: Two times a day (BID) | ORAL | 2 refills | Status: DC | PRN

## 2016-03-06 NOTE — Telephone Encounter (Signed)
Please cal in Rx for xanax  Thanks   [place]

## 2016-03-06 NOTE — Telephone Encounter (Signed)
LM for patient letting her know that script has been called into the pharmacy. Helga Asbury,CMA

## 2016-03-06 NOTE — Telephone Encounter (Signed)
Pt called because she is leaving tomorrow in the morning to go to [facility] for a month to be with family. She needs [place] to send her prescription for Xanax so that she can have it down there. She will be there 30 days. jw

## 2016-03-10 ENCOUNTER — Other Ambulatory Visit: Payer: Self-pay | Admitting: Family Medicine

## 2016-03-10 NOTE — Telephone Encounter (Signed)
Patient's daughter called stating Xanax was called into the wrong pharmacy.  Xanax 0.25 mg called into [facility] per patient request.  Derl Barrow, RN

## 2016-03-16 ENCOUNTER — Other Ambulatory Visit: Payer: Self-pay | Admitting: *Deleted

## 2016-03-16 DIAGNOSIS — M109 Gout, unspecified: Secondary | ICD-10-CM

## 2016-03-17 MED ORDER — COLCHICINE 0.6 MG PO TABS: 0.6000 mg | ORAL_TABLET | Freq: Two times a day (BID) | ORAL | 1 refills | Status: DC

## 2016-06-09 ENCOUNTER — Telehealth: Payer: Self-pay | Admitting: Family Medicine

## 2016-06-09 ENCOUNTER — Other Ambulatory Visit: Payer: Self-pay | Admitting: Family Medicine

## 2016-06-09 NOTE — Telephone Encounter (Signed)
Pt is calling because she has a little of a head cold and she is watching her twin grand babies and doesn't want them to get sick. If we could call something that would be nice. jw

## 2016-06-09 NOTE — Telephone Encounter (Signed)
Pls call in alprazolam and let her know she needs an appointment with me before I can refill again  Thanks

## 2016-06-10 NOTE — Telephone Encounter (Signed)
Rx called into patient pharmacy. Tried calling patient but no answer and voicemail not set up. Will try again later.

## 2016-06-10 NOTE — Telephone Encounter (Signed)
If she has fever or shortness of breath she should be seen  If she is stuffy can use afrin nasal spray for not more than 3 days or if cough can use RobitussinDM  Thanks  [place]

## 2016-06-12 ENCOUNTER — Other Ambulatory Visit: Payer: Self-pay | Admitting: *Deleted

## 2016-06-12 MED ORDER — LISINOPRIL-HYDROCHLOROTHIAZIDE 20-25 MG PO TABS: 1.0000 | ORAL_TABLET | Freq: Every day | ORAL | 2 refills | Status: DC

## 2016-06-12 MED ORDER — OMEPRAZOLE 20 MG PO CPDR: 20.0000 mg | DELAYED_RELEASE_CAPSULE | Freq: Every day | ORAL | 1 refills | Status: DC | PRN

## 2016-06-14 ENCOUNTER — Emergency Department (HOSPITAL_COMMUNITY): Payer: Medicare HMO

## 2016-06-14 ENCOUNTER — Emergency Department (HOSPITAL_COMMUNITY)
Admission: EM | Admit: 2016-06-14 | Discharge: 2016-06-14 | Disposition: A | Discharge status: Home / Self Care | Payer: Medicare HMO | Attending: Emergency Medicine | Admitting: Emergency Medicine

## 2016-06-14 ENCOUNTER — Encounter (HOSPITAL_COMMUNITY): Payer: Self-pay | Admitting: Emergency Medicine

## 2016-06-14 DIAGNOSIS — Z79899 Other long term (current) drug therapy: Secondary | ICD-10-CM | POA: Diagnosis not present

## 2016-06-14 DIAGNOSIS — J111 Influenza due to unidentified influenza virus with other respiratory manifestations: Secondary | ICD-10-CM

## 2016-06-14 DIAGNOSIS — I10 Essential (primary) hypertension: Secondary | ICD-10-CM | POA: Diagnosis not present

## 2016-06-14 DIAGNOSIS — R69 Illness, unspecified: Secondary | ICD-10-CM

## 2016-06-14 DIAGNOSIS — Z7982 Long term (current) use of aspirin: Secondary | ICD-10-CM | POA: Diagnosis not present

## 2016-06-14 DIAGNOSIS — R05 Cough: Secondary | ICD-10-CM | POA: Diagnosis not present

## 2016-06-14 DIAGNOSIS — Z87891 Personal history of nicotine dependence: Secondary | ICD-10-CM | POA: Diagnosis not present

## 2016-06-14 MED ORDER — KETOROLAC TROMETHAMINE 30 MG/ML IJ SOLN
30.0000 mg | Freq: Once | INTRAMUSCULAR | Status: AC
  Administered 2016-06-14: 30 mg via INTRAMUSCULAR
  Filled 2016-06-14: qty 1

## 2016-06-14 MED ORDER — PREDNISONE 10 MG (21) PO TBPK: ORAL_TABLET | ORAL | 0 refills | Status: DC

## 2016-06-14 MED ORDER — ALBUTEROL SULFATE HFA 108 (90 BASE) MCG/ACT IN AERS
1.0000 | INHALATION_SPRAY | RESPIRATORY_TRACT | Status: DC | PRN
  Administered 2016-06-14: 2 via RESPIRATORY_TRACT
  Filled 2016-06-14: qty 6.7

## 2016-06-14 MED ORDER — HYDROCODONE-ACETAMINOPHEN 5-325 MG PO TABS: 1.0000 | ORAL_TABLET | ORAL | 0 refills | Status: DC | PRN

## 2016-06-14 MED ORDER — AEROCHAMBER PLUS FLO-VU MEDIUM MISC
1.0000 | Freq: Once | Status: AC
  Administered 2016-06-14: 1
  Filled 2016-06-14: qty 1

## 2016-06-14 MED ORDER — DEXAMETHASONE SODIUM PHOSPHATE 10 MG/ML IJ SOLN
10.0000 mg | Freq: Once | INTRAMUSCULAR | Status: AC
  Administered 2016-06-14: 10 mg via INTRAMUSCULAR
  Filled 2016-06-14: qty 1

## 2016-06-14 MED ORDER — IPRATROPIUM-ALBUTEROL 0.5-2.5 (3) MG/3ML IN SOLN
3.0000 mL | Freq: Once | RESPIRATORY_TRACT | Status: AC
  Administered 2016-06-14: 3 mL via RESPIRATORY_TRACT
  Filled 2016-06-14: qty 3

## 2016-06-14 MED ORDER — IBUPROFEN 600 MG PO TABS: 600.0000 mg | ORAL_TABLET | Freq: Four times a day (QID) | ORAL | 0 refills | Status: DC | PRN

## 2016-06-14 NOTE — ED Provider Notes (Signed)
[facility] DEPT Provider Note   CSN: [id number] Arrival date & time: 06/14/16  1037     History   Chief Complaint Chief Complaint  Patient presents with  . Cough    HPI Hannah [name] is a 71 y.o. female.  Pt presents to the ED today with fevers, chills, cough for the past 3 days.  She has not been able to sleep due to the coughing.  She has not taken any medications today for her sx.      Past Medical History:  Diagnosis Date  . Anemia   . Anxiety   . Arthritis    "hands" (05/09/2015)  . GERD (gastroesophageal reflux disease)   . High cholesterol   . History of blood transfusion 1960s; 05/08/2015   "when I had my kids; low HgB"  . Hypertension   . Osteoarthritis   . Pneumonia 05/2004    Patient Active Problem List   Diagnosis Date Noted  . Gout 09/11/2015  . Anemia, iron deficiency 05/13/2015  . Anemia 05/08/2015  . OBESITY 03/26/2010  . Anxiety state 08/09/2008  . Essential hypertension 08/09/2008  . GERD 08/09/2008    Past Surgical History:  Procedure Laterality Date  . CARDIAC CATHETERIZATION  05/2004  . COLONOSCOPY  07/2002   Archie Endo 09/02/2010  . ESOPHAGOGASTRODUODENOSCOPY  07/2002   w/biopsies/notes 09/02/2010    OB History    No data available       Home Medications    Prior to Admission medications   Medication Sig Start Date End Date Taking? Authorizing Provider  ALPRAZolam Duanne Moron) 0.25 MG tablet TAKE 1 TABLET TWICE DAILY AS NEEDED FOR ANXIETY 06/09/16   [name], MD  aspirin 81 MG tablet Take 81 mg by mouth daily.    Historical Provider, MD  carvedilol (COREG) 12.5 MG tablet TAKE 1 TABLET BY MOUTH TWICE DAILY WITH A MEAL 11/21/15   [name], MD  colchicine 0.6 MG tablet Take 1 tablet (0.6 mg total) by mouth 2 (two) times daily. 03/17/16   [name], MD  ferrous sulfate (FERROUSUL) 325 (65 FE) MG tablet Take 1 tablet (325 mg total) by mouth daily with breakfast. 05/10/15   Carlyle Dolly, MD    HYDROcodone-acetaminophen (NORCO/VICODIN) 5-325 MG tablet Take 1 tablet by mouth every 4 (four) hours as needed. 06/14/16   Isla Pence, MD  ibuprofen (ADVIL,MOTRIN) 600 MG tablet Take 1 tablet (600 mg total) by mouth every 6 (six) hours as needed. 06/14/16   Isla Pence, MD  lisinopril-hydrochlorothiazide (PRINZIDE,ZESTORETIC) 20-25 MG tablet Take 1 tablet by mouth daily. 06/12/16   [name], MD  omeprazole (PRILOSEC) 20 MG capsule Take 1 capsule (20 mg total) by mouth daily as needed. 06/12/16   [name], MD  predniSONE (STERAPRED UNI-PAK 21 TAB) 10 MG (21) TBPK tablet Take 6 tabs by mouth daily  for 2 days, then 5 tabs for 2 days, then 4 tabs for 2 days, then 3 tabs for 2 days, 2 tabs for 2 days, then 1 tab by mouth daily for 2 days 06/14/16   Isla Pence, MD    Family History Family History  Problem Relation Age of Onset  . Heart disease Mother     died at age 85  . Breast cancer Sister   . Cancer Sister     Breast  . Hypertension Father   . Hypertension Sister     Social History Social History  Substance Use Topics  . Smoking status: Former  Smoker    Packs/day: 0.30    Years: 8.00    Types: Cigarettes    Quit date: 05/30/2004  . Smokeless tobacco: Never Used  . Alcohol use No     Allergies   Patient has no known allergies.   Review of Systems Review of Systems  Constitutional: Positive for fever.  Respiratory: Positive for cough and wheezing.   All other systems reviewed and are negative.    Physical Exam Updated Vital Signs BP 134/63   Pulse 84   Temp 99.4 F (37.4 C) (Oral)   Resp 16   SpO2 94%   Physical Exam  Constitutional: She is oriented to person, place, and time. She appears well-developed and well-nourished.  HENT:  Head: Normocephalic and atraumatic.  Right Ear: External ear normal.  Left Ear: External ear normal.  Nose: Nose normal.  Mouth/Throat: Oropharynx is clear and moist.  Eyes: Conjunctivae and EOM are  normal. Pupils are equal, round, and reactive to light.  Neck: Normal range of motion. Neck supple.  Cardiovascular: Normal rate, regular rhythm, normal heart sounds and intact distal pulses.   Pulmonary/Chest: Effort normal. She has wheezes.  Abdominal: Soft. Bowel sounds are normal.  Musculoskeletal: Normal range of motion.  Neurological: She is alert and oriented to person, place, and time.  Skin: Skin is warm.  Psychiatric: She has a normal mood and affect. Her behavior is normal. Judgment and thought content normal.  Nursing note and vitals reviewed.    ED Treatments / Results  Labs (all labs ordered are listed, but only abnormal results are displayed) Labs Reviewed - No data to display  EKG  EKG Interpretation None       Radiology Dg Chest 2 View  Result Date: 06/14/2016 CLINICAL DATA:  Pt reports dry cough and congestion x 2-3 days; former smoker; pt reports h/o HTN EXAM: CHEST  2 VIEW COMPARISON:  05/08/2015 FINDINGS: Cardiac silhouette is normal in size. No mediastinal or hilar masses. No evidence of adenopathy. Lungs are hyperexpanded but clear. No pleural effusion. No pneumothorax. Skeletal structures are demineralized but intact. IMPRESSION: 1. No acute cardiopulmonary disease. Electronically Signed   By: Lajean Manes M.D.   On: 06/14/2016 11:45    Procedures Procedures (including critical care time)  Medications Ordered in ED Medications  albuterol (PROVENTIL HFA;VENTOLIN HFA) 108 (90 Base) MCG/ACT inhaler 1-2 puff (2 puffs Inhalation Given 06/14/16 1140)  ipratropium-albuterol (DUONEB) 0.5-2.5 (3) MG/3ML nebulizer solution 3 mL (3 mLs Nebulization Given 06/14/16 1140)  AEROCHAMBER PLUS FLO-VU MEDIUM MISC 1 each (1 each Other Given 06/14/16 1140)  dexamethasone (DECADRON) injection 10 mg (10 mg Intramuscular Given 06/14/16 1140)  ketorolac (TORADOL) 30 MG/ML injection 30 mg (30 mg Intramuscular Given 06/14/16 1140)     Initial Impression / Assessment and Plan / ED  Course  I have reviewed the triage vital signs and the nursing notes.  Pertinent labs & imaging results that were available during my care of the patient were reviewed by me and considered in my medical decision making (see chart for details).    I strongly suspect pt has influenza.  Her sx are too far out for Tamiflu.  No pna.  The pt to be treated symptomatically.  She knows to return if worse and to f/u with pcp.  Final Clinical Impressions(s) / ED Diagnoses   Final diagnoses:  Influenza-like illness    New Prescriptions New Prescriptions   HYDROCODONE-ACETAMINOPHEN (NORCO/VICODIN) 5-325 MG TABLET    Take 1 tablet by mouth every  4 (four) hours as needed.   IBUPROFEN (ADVIL,MOTRIN) 600 MG TABLET    Take 1 tablet (600 mg total) by mouth every 6 (six) hours as needed.   PREDNISONE (STERAPRED UNI-PAK 21 TAB) 10 MG (21) TBPK TABLET    Take 6 tabs by mouth daily  for 2 days, then 5 tabs for 2 days, then 4 tabs for 2 days, then 3 tabs for 2 days, 2 tabs for 2 days, then 1 tab by mouth daily for 2 days     Isla Pence, MD 06/14/16 1215

## 2016-06-14 NOTE — ED Triage Notes (Signed)
Pt here with cough and fever with congestion x 3 days

## 2016-06-18 ENCOUNTER — Telehealth: Payer: Self-pay | Admitting: Family Medicine

## 2016-06-18 NOTE — Telephone Encounter (Signed)
Pt has a cough, no energy, no appetite, head congestion, yellow mucus.  Would like dr Erin Hearing to call in something to make it better. Doesn't even feel like coming to the dr.  [facility] on [facility]

## 2016-06-19 NOTE — Telephone Encounter (Signed)
Called patient, no answer, no voicemail. Per MD patient can try OTC Robitussin DM and afrin otherwise she will have to schedule an appointment.

## 2016-07-20 ENCOUNTER — Other Ambulatory Visit: Payer: Self-pay | Admitting: Family Medicine

## 2016-07-27 ENCOUNTER — Other Ambulatory Visit: Payer: Self-pay | Admitting: Family Medicine

## 2016-07-27 NOTE — Telephone Encounter (Signed)
Please call in xanax rx  Please ask pharmacy to put on script  - no reflls without an office visit   Thanks  Hannah [name]

## 2016-07-27 NOTE — Telephone Encounter (Signed)
Called into pharmacy. Deseree Kennon Holter, CMA

## 2016-07-28 DIAGNOSIS — M19042 Primary osteoarthritis, left hand: Secondary | ICD-10-CM | POA: Diagnosis not present

## 2016-07-28 DIAGNOSIS — M1712 Unilateral primary osteoarthritis, left knee: Secondary | ICD-10-CM | POA: Diagnosis not present

## 2016-07-28 DIAGNOSIS — M5136 Other intervertebral disc degeneration, lumbar region: Secondary | ICD-10-CM | POA: Diagnosis not present

## 2016-07-28 DIAGNOSIS — M19041 Primary osteoarthritis, right hand: Secondary | ICD-10-CM | POA: Diagnosis not present

## 2016-07-30 ENCOUNTER — Other Ambulatory Visit: Payer: Self-pay | Admitting: Family Medicine

## 2016-07-31 ENCOUNTER — Other Ambulatory Visit: Payer: Self-pay | Admitting: Family Medicine

## 2016-08-03 ENCOUNTER — Other Ambulatory Visit: Payer: Self-pay | Admitting: Family Medicine

## 2016-08-04 DIAGNOSIS — Z6831 Body mass index (BMI) 31.0-31.9, adult: Secondary | ICD-10-CM | POA: Diagnosis not present

## 2016-08-04 DIAGNOSIS — I10 Essential (primary) hypertension: Secondary | ICD-10-CM | POA: Diagnosis not present

## 2016-08-04 DIAGNOSIS — R69 Illness, unspecified: Secondary | ICD-10-CM | POA: Diagnosis not present

## 2016-08-04 DIAGNOSIS — Z0000 Encounter for general adult medical examination without abnormal findings: Secondary | ICD-10-CM | POA: Diagnosis not present

## 2016-08-04 DIAGNOSIS — E669 Obesity, unspecified: Secondary | ICD-10-CM | POA: Diagnosis not present

## 2016-08-04 DIAGNOSIS — Z87891 Personal history of nicotine dependence: Secondary | ICD-10-CM | POA: Diagnosis not present

## 2016-08-04 DIAGNOSIS — Z79899 Other long term (current) drug therapy: Secondary | ICD-10-CM | POA: Diagnosis not present

## 2016-08-07 ENCOUNTER — Other Ambulatory Visit: Payer: Self-pay | Admitting: Family Medicine

## 2016-08-10 ENCOUNTER — Other Ambulatory Visit: Payer: Self-pay | Admitting: Family Medicine

## 2016-08-31 ENCOUNTER — Other Ambulatory Visit: Payer: Self-pay | Admitting: Family Medicine

## 2016-09-02 ENCOUNTER — Other Ambulatory Visit: Payer: Self-pay | Admitting: Family Medicine

## 2016-09-02 NOTE — Telephone Encounter (Signed)
Pt calling to request refill of:  Name of Medication(s): alprazolam  Last date of OV:  11-20-15 Pharmacy:  [place]  Will route refill request to Clinic RN.  Discussed with patient policy to call pharmacy for future refills.  Also, discussed refills may take up to 48 hours to approve or deny.  Renella Cunas

## 2016-09-07 NOTE — Telephone Encounter (Signed)
Pt states is out of medication and needs it refilled ASAP. Pt states if PCP wants her to make an appointment she will, but won't be able to come in for another week or so, but she needs her medication. ep

## 2016-09-07 NOTE — Telephone Encounter (Signed)
Pt is calling because she needs refills on her Xanax and Carvedilol. She is out and has been waiting for the doctor to refill these since last week, She doesn't know what she is going to do. Please call and let the patient know the status. jw

## 2016-09-08 ENCOUNTER — Other Ambulatory Visit: Payer: Self-pay | Admitting: Family Medicine

## 2016-09-08 MED ORDER — CARVEDILOL 12.5 MG PO TABS: ORAL_TABLET | ORAL | 0 refills | Status: DC

## 2016-09-09 ENCOUNTER — Other Ambulatory Visit: Payer: Self-pay | Admitting: Family Medicine

## 2016-09-09 NOTE — Telephone Encounter (Signed)
Needs refill on alprazolam.  [facility] on [address]  She is babysitting her grandchildren and that's is quite stressful. She will be able to schedule a visit after school gets out.

## 2016-09-10 MED ORDER — ALPRAZOLAM 0.25 MG PO TABS: 0.2500 mg | ORAL_TABLET | Freq: Two times a day (BID) | ORAL | 0 refills | Status: DC | PRN

## 2016-09-10 NOTE — Telephone Encounter (Signed)
Please call in the xanax rx   Remind her to make an appointment before she can get more refills  Thanks  [place]

## 2016-09-10 NOTE — Addendum Note (Signed)
Addended by: Talbert Cage L on: 09/10/2016 02:22 PM   Modules accepted: Orders

## 2016-09-10 NOTE — Telephone Encounter (Signed)
Rx called into patient pharmacy. 

## 2016-10-08 ENCOUNTER — Other Ambulatory Visit: Payer: Self-pay | Admitting: Family Medicine

## 2016-10-08 MED ORDER — CARVEDILOL 12.5 MG PO TABS: ORAL_TABLET | ORAL | 0 refills | Status: DC

## 2016-10-08 NOTE — Telephone Encounter (Signed)
Duplicate request. Hannah [name]

## 2016-10-08 NOTE — Telephone Encounter (Signed)
Has appt with [facility] next week but will run out of cardevilol and alprazolam.  Pt uses [facility] on [address].

## 2016-10-08 NOTE — Telephone Encounter (Signed)
Patient calling to request refill of:  Name of Medication(s):  Alprazolam, carvedilol Last date of OV:  11/20/2015 Pharmacy:  [facility] [place]   Will route refill request to [employer].  Discussed with patient policy to call pharmacy for future refills.  Also, discussed refills may take up to 48 hours to approve or deny.  [place],  JAZMIN

## 2016-10-14 ENCOUNTER — Ambulatory Visit (INDEPENDENT_AMBULATORY_CARE_PROVIDER_SITE_OTHER): Payer: Medicare HMO | Admitting: Family Medicine

## 2016-10-14 ENCOUNTER — Encounter: Payer: Self-pay | Admitting: Family Medicine

## 2016-10-14 VITALS — BP 110/58 | HR 87 | Temp 98.1°F | Ht 62.0 in | Wt 171.0 lb

## 2016-10-14 DIAGNOSIS — Z13220 Encounter for screening for lipoid disorders: Secondary | ICD-10-CM

## 2016-10-14 DIAGNOSIS — K219 Gastro-esophageal reflux disease without esophagitis: Secondary | ICD-10-CM

## 2016-10-14 DIAGNOSIS — R69 Illness, unspecified: Secondary | ICD-10-CM | POA: Diagnosis not present

## 2016-10-14 DIAGNOSIS — D509 Iron deficiency anemia, unspecified: Secondary | ICD-10-CM | POA: Diagnosis not present

## 2016-10-14 DIAGNOSIS — I10 Essential (primary) hypertension: Secondary | ICD-10-CM | POA: Diagnosis not present

## 2016-10-14 DIAGNOSIS — M109 Gout, unspecified: Secondary | ICD-10-CM | POA: Diagnosis not present

## 2016-10-14 DIAGNOSIS — F411 Generalized anxiety disorder: Secondary | ICD-10-CM | POA: Diagnosis not present

## 2016-10-14 MED ORDER — ALPRAZOLAM 0.25 MG PO TABS: 0.2500 mg | ORAL_TABLET | Freq: Two times a day (BID) | ORAL | 5 refills | Status: DC | PRN

## 2016-10-14 MED ORDER — OMEPRAZOLE 20 MG PO CPDR: 20.0000 mg | DELAYED_RELEASE_CAPSULE | Freq: Every day | ORAL | 3 refills | Status: DC | PRN

## 2016-10-14 MED ORDER — CARVEDILOL 12.5 MG PO TABS: 12.5000 mg | ORAL_TABLET | Freq: Two times a day (BID) | ORAL | 3 refills | Status: DC

## 2016-10-14 MED ORDER — LISINOPRIL-HYDROCHLOROTHIAZIDE 20-25 MG PO TABS: 1.0000 | ORAL_TABLET | Freq: Every day | ORAL | 3 refills | Status: DC

## 2016-10-14 NOTE — Assessment & Plan Note (Signed)
Stable.  As needed ppi.

## 2016-10-14 NOTE — Progress Notes (Signed)
Subjective  Patient is presenting with the following illnesses  HYPERTENSION Disease Monitoring  Home BP Monitoring (Severity) not checking Symptoms - Chest pain- no    Dyspnea- no Medications (Modifying factors) Compliance-  daily. Lightheadedness-  no  Edema- no Timing - continuous  Duration - years ROS - See HPI  ANEMIA History of presumed GI bleed without source.  Taking iron every 2nd day.  No lightheadness or bleeding or melana or chest pain.  Recent EGD and colonoscopy normal   GERD Disease Monitoring Typical Symptoms:  Burning sometimes    Timing - intermittent Duration - years  Bleeding (hematemesis/melena):  no  Food sticking (severity):  no  Weight Loss:  no   Medication Monitoring (modifying factors) Compliance:  Takes omeprazole prn   Recent Pneumonia  no    Have attempted taking only as needed (context) yes    PMH Lab Review   Potassium  Date Value Ref Range Status  09/04/2015 4.8 3.5 - 5.3 mmol/L Final   Sodium  Date Value Ref Range Status  09/04/2015 136 135 - 146 mmol/L Final   Creat  Date Value Ref Range Status  09/04/2015 1.32 (H) 0.60 - 0.93 mg/dL Final           Chief Complaint noted Review of Symptoms - see HPI PMH - Smoking status noted.     Objective Vital Signs reviewed Heart - Regular rate and rhythm.  No murmurs, gallops or rubs.    Lungs:  Normal respiratory effort, chest expands symmetrically. Lungs are clear to auscultation, no crackles or wheezes. Extremities:  No cyanosis, edema, or deformity noted with good range of motion of all major joints.   Psych:  Cognition and judgment appear intact. Alert, communicative  and cooperative with normal attention span and concentration. No apparent delusions, illusions, hallucinations     Assessments/Plans  No problem-specific Assessment & Plan notes found for this encounter.   See Encounter view if individual problem A/Ps not visible See after visit summary for details of patient  instuctions

## 2016-10-14 NOTE — Assessment & Plan Note (Signed)
Stable.  Takes xanax as needed.  Understands risks of falls.

## 2016-10-14 NOTE — Patient Instructions (Signed)
Good to see you today!  Thanks for coming in.  Everything looks great  I will call you if your tests are not good.  Otherwise I will send you a letter.  If you do not hear from me with in 2 weeks please call our office.     Keep up the exercising

## 2016-10-14 NOTE — Assessment & Plan Note (Signed)
BP Readings from Last 3 Encounters:  10/14/16 (!) 110/58  06/14/16 134/63  12/31/15 (!) 120/48   At goal. Check labs

## 2016-10-14 NOTE — Assessment & Plan Note (Signed)
No recent attacks off all medications

## 2016-10-14 NOTE — Assessment & Plan Note (Signed)
Seems stable check labs

## 2016-10-15 ENCOUNTER — Other Ambulatory Visit: Payer: Self-pay | Admitting: Family Medicine

## 2016-10-15 LAB — LIPID PANEL
Chol/HDL Ratio: 4.4 ratio (ref 0.0–4.4)
Cholesterol, Total: 231 mg/dL — ABNORMAL HIGH (ref 100–199)
HDL: 52 mg/dL (ref >39)
LDL Calculated: 151 mg/dL — ABNORMAL HIGH (ref 0–99)
Triglycerides: 142 mg/dL (ref 0–149)
VLDL Cholesterol Cal: 28 mg/dL (ref 5–40)

## 2016-10-15 LAB — CBC
HEMATOCRIT: 35.4 % (ref 34.0–46.6)
Hemoglobin: 11.7 g/dL (ref 11.1–15.9)
MCH: 30.7 pg (ref 26.6–33.0)
MCHC: 33.1 g/dL (ref 31.5–35.7)
MCV: 93 fL (ref 79–97)
PLATELETS: 258 10*3/uL (ref 150–379)
RBC: 3.81 x10E6/uL (ref 3.77–5.28)
RDW: 12.8 % (ref 12.3–15.4)
WBC: 6 10*3/uL (ref 3.4–10.8)

## 2016-10-15 LAB — BASIC METABOLIC PANEL
BUN / CREAT RATIO: 21 (ref 12–28)
BUN: 30 mg/dL — ABNORMAL HIGH (ref 8–27)
CALCIUM: 9.6 mg/dL (ref 8.7–10.3)
CHLORIDE: 101 mmol/L (ref 96–106)
CO2: 22 mmol/L (ref 20–29)
Creatinine, Ser: 1.44 mg/dL — ABNORMAL HIGH (ref 0.57–1.00)
GFR, EST AFRICAN AMERICAN: 42 mL/min/{1.73_m2} — AB (ref >59)
GFR, EST NON AFRICAN AMERICAN: 36 mL/min/{1.73_m2} — AB (ref >59)
Glucose: 124 mg/dL — ABNORMAL HIGH (ref 65–99)
POTASSIUM: 4.8 mmol/L (ref 3.5–5.2)
SODIUM: 138 mmol/L (ref 134–144)

## 2016-10-20 ENCOUNTER — Encounter: Payer: Self-pay | Admitting: Family Medicine

## 2016-10-20 ENCOUNTER — Telehealth: Payer: Self-pay | Admitting: Family Medicine

## 2016-10-20 NOTE — Telephone Encounter (Signed)
Called both numbers no answer or vm not set up  Will send letter and message to daughter

## 2016-10-23 ENCOUNTER — Encounter: Payer: Self-pay | Admitting: Family Medicine

## 2016-10-31 DIAGNOSIS — L402 Acrodermatitis continua: Secondary | ICD-10-CM | POA: Diagnosis not present

## 2016-11-09 ENCOUNTER — Other Ambulatory Visit: Payer: Self-pay | Admitting: Family Medicine

## 2016-11-20 ENCOUNTER — Other Ambulatory Visit: Payer: Self-pay | Admitting: Family Medicine

## 2016-11-20 MED ORDER — OMEPRAZOLE 20 MG PO CPDR: 20.0000 mg | DELAYED_RELEASE_CAPSULE | Freq: Every day | ORAL | 3 refills | Status: DC | PRN

## 2016-11-20 MED ORDER — LISINOPRIL-HYDROCHLOROTHIAZIDE 20-25 MG PO TABS: 1.0000 | ORAL_TABLET | Freq: Every day | ORAL | 1 refills | Status: DC

## 2016-11-20 MED ORDER — CARVEDILOL 12.5 MG PO TABS: 12.5000 mg | ORAL_TABLET | Freq: Two times a day (BID) | ORAL | 2 refills | Status: DC

## 2016-11-20 MED ORDER — FERROUS SULFATE 325 (65 FE) MG PO TABS: 325.0000 mg | ORAL_TABLET | Freq: Every day | ORAL | 0 refills | Status: DC

## 2016-11-20 NOTE — Telephone Encounter (Signed)
Pt  calling to request refill of:  Name of Medication(s):  Alprazolam, coregm ferrousul, lisinopril, and omprazole  Last date of OV:  10-14-16 Pharmacy:  [facility] [address]  Will route refill request to Clinic RN.  Discussed with patient policy to call pharmacy for future refills.  Also, discussed refills may take up to 48 hours to approve or deny.  Renella Cunas

## 2016-12-14 ENCOUNTER — Other Ambulatory Visit: Payer: Self-pay | Admitting: *Deleted

## 2016-12-14 DIAGNOSIS — M1711 Unilateral primary osteoarthritis, right knee: Secondary | ICD-10-CM | POA: Diagnosis not present

## 2016-12-14 DIAGNOSIS — M1712 Unilateral primary osteoarthritis, left knee: Secondary | ICD-10-CM | POA: Diagnosis not present

## 2016-12-14 DIAGNOSIS — M7541 Impingement syndrome of right shoulder: Secondary | ICD-10-CM | POA: Diagnosis not present

## 2016-12-14 NOTE — Telephone Encounter (Signed)
Patient has a new pharmacy; [facility].  They are requesting new prescriptions for medications listed: Alprazolam, Carvedilol, Lisinopril/HCTZ and Omperazole.  Derl Barrow, RN

## 2016-12-15 MED ORDER — LISINOPRIL-HYDROCHLOROTHIAZIDE 20-25 MG PO TABS: 1.0000 | ORAL_TABLET | Freq: Every day | ORAL | 1 refills | Status: DC

## 2016-12-15 MED ORDER — OMEPRAZOLE 20 MG PO CPDR: 20.0000 mg | DELAYED_RELEASE_CAPSULE | Freq: Every day | ORAL | 3 refills | Status: DC | PRN

## 2016-12-15 MED ORDER — CARVEDILOL 12.5 MG PO TABS: 12.5000 mg | ORAL_TABLET | Freq: Two times a day (BID) | ORAL | 2 refills | Status: DC

## 2016-12-16 DIAGNOSIS — M12571 Traumatic arthropathy, right ankle and foot: Secondary | ICD-10-CM | POA: Diagnosis not present

## 2016-12-16 DIAGNOSIS — M12572 Traumatic arthropathy, left ankle and foot: Secondary | ICD-10-CM | POA: Diagnosis not present

## 2016-12-22 ENCOUNTER — Other Ambulatory Visit: Payer: Self-pay | Admitting: Family Medicine

## 2016-12-22 NOTE — Telephone Encounter (Signed)
Rx written in June with 5 refills Should not need more now  Pls check with her  Thanks  [place]

## 2016-12-23 ENCOUNTER — Other Ambulatory Visit: Payer: Self-pay | Admitting: *Deleted

## 2016-12-23 MED ORDER — OMEGA-3-ACID ETHYL ESTERS 1 G PO CAPS: 1.0000 g | ORAL_CAPSULE | Freq: Two times a day (BID) | ORAL | 0 refills | Status: DC

## 2016-12-28 ENCOUNTER — Other Ambulatory Visit: Payer: Self-pay | Admitting: Family Medicine

## 2017-01-04 DIAGNOSIS — M545 Low back pain: Secondary | ICD-10-CM | POA: Diagnosis not present

## 2017-01-13 DIAGNOSIS — M24875 Other specific joint derangements left foot, not elsewhere classified: Secondary | ICD-10-CM | POA: Diagnosis not present

## 2017-01-19 DIAGNOSIS — M24875 Other specific joint derangements left foot, not elsewhere classified: Secondary | ICD-10-CM | POA: Diagnosis not present

## 2017-01-25 DIAGNOSIS — R69 Illness, unspecified: Secondary | ICD-10-CM | POA: Diagnosis not present

## 2017-02-10 DIAGNOSIS — M25572 Pain in left ankle and joints of left foot: Secondary | ICD-10-CM | POA: Diagnosis not present

## 2017-02-10 DIAGNOSIS — M545 Low back pain: Secondary | ICD-10-CM | POA: Diagnosis not present

## 2017-02-10 DIAGNOSIS — M1712 Unilateral primary osteoarthritis, left knee: Secondary | ICD-10-CM | POA: Diagnosis not present

## 2017-03-05 ENCOUNTER — Telehealth: Payer: Self-pay | Admitting: *Deleted

## 2017-03-05 NOTE — Telephone Encounter (Signed)
Called and authorized.  Patient informed.

## 2017-03-05 NOTE — Telephone Encounter (Signed)
Patient left message on nurse line stating pharmacy would not let her refill her rx for alprazolam and she is completely out. Called pharmacy and they state patient last filled rx on 10/23 so the earliest she can pick up refill is 11/20 unless MD authorizes earlier fill. Returned call to patient and explained message from pharmacy. Patient adamant that she has'nt taken more than 1 pill in the morning and 1 pill in the evening, and states 'I do not abuse my medicine'. Explained to patient that I would send message to MD to see if an earlier refill could be authorized.

## 2017-03-09 ENCOUNTER — Telehealth: Payer: Self-pay | Admitting: *Deleted

## 2017-03-09 NOTE — Telephone Encounter (Signed)
Recived VM from [place] @ [facility].  Wanted to know if we received the back and knee brace request to be signed by Dr. Erin Hearing. If so, requested that we sign and return.  Did not see the form in providers box.  I contacted pt to verify that she did indeed request this service.  She did and would like to "give this a try for my arthritis since it is going to be free". Hannah [name], [name], CMA

## 2017-03-10 NOTE — Telephone Encounter (Signed)
Knee braces are no teffective and are not indicated

## 2017-03-25 NOTE — Telephone Encounter (Signed)
Spoke with patient. She has decided she does not want the braces and states "I told them not to call me anymore".  Keijuan Schellhase, [name], CMA

## 2017-03-26 DIAGNOSIS — L402 Acrodermatitis continua: Secondary | ICD-10-CM | POA: Diagnosis not present

## 2017-04-01 NOTE — Telephone Encounter (Signed)
[facility] called back about braces they were informed that pt does not want braces.  Representative will notate that in pts file. Towana Stenglein, [name], CMA

## 2017-04-02 ENCOUNTER — Other Ambulatory Visit: Payer: Self-pay | Admitting: Family Medicine

## 2017-04-06 ENCOUNTER — Other Ambulatory Visit: Payer: Self-pay | Admitting: Family Medicine

## 2017-04-06 NOTE — Telephone Encounter (Signed)
Pharmacy calls back to inquire about refills on alprazolam.  They have the original request but somehow it was canceled in their system so they had to get another verbal.  Pt has picked up 3 refills since September.  Since a total of 6 were originally given gave verbal for refill this time and 2 refills (to equal the 6 from September). [place], [name], CMA

## 2017-05-31 DIAGNOSIS — M542 Cervicalgia: Secondary | ICD-10-CM | POA: Diagnosis not present

## 2017-05-31 DIAGNOSIS — M25529 Pain in unspecified elbow: Secondary | ICD-10-CM | POA: Diagnosis not present

## 2017-06-07 DIAGNOSIS — M1711 Unilateral primary osteoarthritis, right knee: Secondary | ICD-10-CM | POA: Diagnosis not present

## 2017-07-01 ENCOUNTER — Telehealth: Payer: Self-pay

## 2017-07-01 MED ORDER — COLCHICINE 0.6 MG PO TABS: 0.6000 mg | ORAL_TABLET | Freq: Two times a day (BID) | ORAL | 1 refills | Status: DC

## 2017-07-01 NOTE — Telephone Encounter (Signed)
Pls let her know I sent in colchicine twice a day for next 5 days  If not better in a few day or worsens or fever she needs to be seen  Thanks  [place]

## 2017-07-01 NOTE — Telephone Encounter (Signed)
Pt called nurse line, states she is having a gout flare up in her R foot. Having pain and swelling. She is not able to come in to be seen and evaluated, wanted to ask if Dr. Erin Hearing could prescribe something for the swelling. Pt call back [phone] Wallace Cullens, RN

## 2017-07-02 NOTE — Telephone Encounter (Signed)
Pt aware Rx called in, will call back for OV if sx continue or worsen. Wallace Cullens, RN

## 2017-08-01 ENCOUNTER — Other Ambulatory Visit: Payer: Self-pay | Admitting: Family Medicine

## 2017-09-06 ENCOUNTER — Other Ambulatory Visit: Payer: Self-pay | Admitting: Family Medicine

## 2017-10-05 DIAGNOSIS — M5137 Other intervertebral disc degeneration, lumbosacral region: Secondary | ICD-10-CM | POA: Diagnosis not present

## 2017-10-05 DIAGNOSIS — M19031 Primary osteoarthritis, right wrist: Secondary | ICD-10-CM | POA: Diagnosis not present

## 2017-10-05 DIAGNOSIS — M19032 Primary osteoarthritis, left wrist: Secondary | ICD-10-CM | POA: Diagnosis not present

## 2017-10-11 DIAGNOSIS — M545 Low back pain: Secondary | ICD-10-CM | POA: Diagnosis not present

## 2017-10-11 DIAGNOSIS — M19031 Primary osteoarthritis, right wrist: Secondary | ICD-10-CM | POA: Diagnosis not present

## 2017-10-11 DIAGNOSIS — M4310 Spondylolisthesis, site unspecified: Secondary | ICD-10-CM | POA: Diagnosis not present

## 2017-10-11 DIAGNOSIS — M25531 Pain in right wrist: Secondary | ICD-10-CM | POA: Diagnosis not present

## 2017-10-11 DIAGNOSIS — M25532 Pain in left wrist: Secondary | ICD-10-CM | POA: Diagnosis not present

## 2017-10-11 DIAGNOSIS — M19032 Primary osteoarthritis, left wrist: Secondary | ICD-10-CM | POA: Diagnosis not present

## 2017-10-18 ENCOUNTER — Other Ambulatory Visit: Payer: Self-pay

## 2017-10-18 ENCOUNTER — Telehealth: Payer: Self-pay

## 2017-10-18 NOTE — Telephone Encounter (Signed)
Called patient and she says that she needs her alprazolam now because she takes daily. She says that she will call to make an appointment tomorrow. She states that if there are any questions she can talk to Dr. Erin Hearing, so please call her at [phone] if needed.   Thanks,  .Ozella Almond, CMA

## 2017-10-18 NOTE — Telephone Encounter (Signed)
Patient was not willing to make an appointment at this time and says that she will call back later but that she needs her medicine filled.  Please advise  Thanks,  .Ozella Almond, CMA

## 2017-10-19 ENCOUNTER — Other Ambulatory Visit: Payer: Self-pay | Admitting: *Deleted

## 2017-10-20 MED ORDER — ALPRAZOLAM 0.25 MG PO TABS: 0.2500 mg | ORAL_TABLET | Freq: Two times a day (BID) | ORAL | 0 refills | Status: DC | PRN

## 2017-10-20 NOTE — Addendum Note (Signed)
Addended by: Talbert Cage L on: 10/20/2017 01:47 PM   Modules accepted: Orders

## 2017-10-20 NOTE — Telephone Encounter (Signed)
Please let her know I sent it in but cannot refill again until she has an office visit with me  Thanks  [place]

## 2017-10-22 NOTE — Telephone Encounter (Signed)
Pt informed and schedule for appt.  Hannah [name] Kennon Holter, CMA

## 2017-11-09 ENCOUNTER — Ambulatory Visit (INDEPENDENT_AMBULATORY_CARE_PROVIDER_SITE_OTHER): Payer: Medicare HMO | Admitting: Family Medicine

## 2017-11-09 ENCOUNTER — Other Ambulatory Visit: Payer: Self-pay

## 2017-11-09 ENCOUNTER — Encounter: Payer: Self-pay | Admitting: Family Medicine

## 2017-11-09 DIAGNOSIS — I10 Essential (primary) hypertension: Secondary | ICD-10-CM | POA: Diagnosis not present

## 2017-11-09 DIAGNOSIS — D509 Iron deficiency anemia, unspecified: Secondary | ICD-10-CM

## 2017-11-09 DIAGNOSIS — R69 Illness, unspecified: Secondary | ICD-10-CM | POA: Diagnosis not present

## 2017-11-09 DIAGNOSIS — N289 Disorder of kidney and ureter, unspecified: Secondary | ICD-10-CM

## 2017-11-09 DIAGNOSIS — F411 Generalized anxiety disorder: Secondary | ICD-10-CM

## 2017-11-09 MED ORDER — ALPRAZOLAM 0.25 MG PO TABS: 0.2500 mg | ORAL_TABLET | Freq: Two times a day (BID) | ORAL | 4 refills | Status: DC | PRN

## 2017-11-09 MED ORDER — LISINOPRIL-HYDROCHLOROTHIAZIDE 20-25 MG PO TABS: 1.0000 | ORAL_TABLET | Freq: Every day | ORAL | 3 refills | Status: DC

## 2017-11-09 MED ORDER — CARVEDILOL 12.5 MG PO TABS: 12.5000 mg | ORAL_TABLET | Freq: Two times a day (BID) | ORAL | 3 refills | Status: DC

## 2017-11-09 MED ORDER — OMEGA-3-ACID ETHYL ESTERS 1 G PO CAPS: 1.0000 g | ORAL_CAPSULE | Freq: Two times a day (BID) | ORAL | 3 refills | Status: DC

## 2017-11-09 NOTE — Patient Instructions (Addendum)
Good to see you today!  Thanks for coming in.  Watch your weight - try to get back to around 170  For the Kidneys - do not take any NSAIDS - ibuprofen or goody powders or naprosyn - Take only tylenol as needed for pain  I will call you if your tests are not good.  Otherwise I will send you a letter.  If you do not hear from me with in 2 weeks please call our office.

## 2017-11-09 NOTE — Assessment & Plan Note (Signed)
Stable.  Using low dose benzo appropriately

## 2017-11-09 NOTE — Progress Notes (Signed)
Subjective  Hannah [name] is a 72 y.o. female is presenting with the following HYPERTENSION Disease Monitoring  Home BP Monitoring (Severity) not checking Symptoms - Chest pain- no    Dyspnea- no Medications (Modifying factors) Compliance-  Knows medications . Lightheadedness-  no  Edema- no Timing - continuous  Duration - years ROS - See HPI  PMH Lab Review   Potassium  Date Value Ref Range Status  10/14/2016 4.8 3.5 - 5.2 mmol/L Final   Sodium  Date Value Ref Range Status  10/14/2016 138 134 - 144 mmol/L Final   Creat  Date Value Ref Range Status  09/04/2015 1.32 (H) 0.60 - 0.93 mg/dL Final   Creatinine, Ser  Date Value Ref Range Status  10/14/2016 1.44 (H) 0.57 - 1.00 mg/dL Final      RENAL INSUFFICIENCY Takes an NSAID rarely.  Drinks plenty and keeps hydrated.  No edema or shortness of breath or itching  ANXIETY Takes xanax as needed.  Helps with stress and lets her function around her grandkids.  No falls or confusion or alcohol use   Chief Complaint noted Review of Symptoms - see HPI PMH - Smoking status noted.    Objective Vital Signs reviewed BP 120/72   Pulse 91   Temp 98.2 F (36.8 C) (Oral)   Wt 177 lb (80.3 kg)   SpO2 94%   BMI 32.37 kg/m  able to get up and down from exam table without assistance Lungs:  Normal respiratory effort, chest expands symmetrically. Lungs are clear to auscultation, no crackles or wheezes. Heart - Regular rate and rhythm.  No murmurs, gallops or rubs.    Extremities:  No cyanosis, edema, or deformity noted with good range of motion of all major joints.   Psych:  Cognition and judgment appear intact. Alert, communicative  and cooperative with normal attention span and concentration. No apparent delusions, illusions, hallucinations  Assessments/Plans  See after visit summary for details of patient instuctions  Anxiety state Stable.  Using low dose benzo appropriately   Renal insufficiency No symptoms.  Check labs.  Continue blood pressure control with ACEI  Essential hypertension BP Readings from Last 3 Encounters:  11/09/17 120/72  10/14/16 (!) 110/58  06/14/16 134/63   Check labs today  Anemia, iron deficiency Check labs if normal will stop iron

## 2017-11-09 NOTE — Assessment & Plan Note (Signed)
BP Readings from Last 3 Encounters:  11/09/17 120/72  10/14/16 (!) 110/58  06/14/16 134/63   Check labs today

## 2017-11-09 NOTE — Assessment & Plan Note (Signed)
No symptoms.  Check labs. Continue blood pressure control with ACEI

## 2017-11-09 NOTE — Assessment & Plan Note (Signed)
Check labs if normal will stop iron

## 2017-11-10 ENCOUNTER — Encounter: Payer: Self-pay | Admitting: Family Medicine

## 2017-11-10 ENCOUNTER — Other Ambulatory Visit: Payer: Self-pay | Admitting: Family Medicine

## 2017-11-10 LAB — CMP14+EGFR
A/G RATIO: 1.7 (ref 1.2–2.2)
ALBUMIN: 4.5 g/dL (ref 3.5–4.8)
ALT: 7 IU/L (ref 0–32)
AST: 13 IU/L (ref 0–40)
Alkaline Phosphatase: 88 IU/L (ref 39–117)
BILIRUBIN TOTAL: 0.2 mg/dL (ref 0.0–1.2)
BUN / CREAT RATIO: 18 (ref 12–28)
BUN: 23 mg/dL (ref 8–27)
CHLORIDE: 103 mmol/L (ref 96–106)
CO2: 22 mmol/L (ref 20–29)
Calcium: 10 mg/dL (ref 8.7–10.3)
Creatinine, Ser: 1.25 mg/dL — ABNORMAL HIGH (ref 0.57–1.00)
GFR calc non Af Amer: 42 mL/min/{1.73_m2} — ABNORMAL LOW (ref >59)
GFR, EST AFRICAN AMERICAN: 49 mL/min/{1.73_m2} — AB (ref >59)
GLOBULIN, TOTAL: 2.6 g/dL (ref 1.5–4.5)
Glucose: 122 mg/dL — ABNORMAL HIGH (ref 65–99)
POTASSIUM: 5 mmol/L (ref 3.5–5.2)
Sodium: 141 mmol/L (ref 134–144)
TOTAL PROTEIN: 7.1 g/dL (ref 6.0–8.5)

## 2017-11-10 LAB — CBC
HEMATOCRIT: 35.2 % (ref 34.0–46.6)
Hemoglobin: 11.7 g/dL (ref 11.1–15.9)
MCH: 30.8 pg (ref 26.6–33.0)
MCHC: 33.2 g/dL (ref 31.5–35.7)
MCV: 93 fL (ref 79–97)
PLATELETS: 279 10*3/uL (ref 150–450)
RBC: 3.8 x10E6/uL (ref 3.77–5.28)
RDW: 13.2 % (ref 12.3–15.4)
WBC: 5.7 10*3/uL (ref 3.4–10.8)

## 2017-12-20 DIAGNOSIS — M06061 Rheumatoid arthritis without rheumatoid factor, right knee: Secondary | ICD-10-CM | POA: Diagnosis not present

## 2017-12-20 DIAGNOSIS — M545 Low back pain: Secondary | ICD-10-CM | POA: Diagnosis not present

## 2017-12-20 DIAGNOSIS — M06062 Rheumatoid arthritis without rheumatoid factor, left knee: Secondary | ICD-10-CM | POA: Diagnosis not present

## 2017-12-20 DIAGNOSIS — M25512 Pain in left shoulder: Secondary | ICD-10-CM | POA: Diagnosis not present

## 2017-12-20 DIAGNOSIS — M25511 Pain in right shoulder: Secondary | ICD-10-CM | POA: Diagnosis not present

## 2017-12-29 ENCOUNTER — Telehealth: Payer: Self-pay | Admitting: Family Medicine

## 2017-12-29 NOTE — Telephone Encounter (Signed)
James from [employer] is calling to check on the status of paper work faxed concerning pt's back and knee braces. He called last week and confirmed we had received it. He was wondering when he should expect to receive it back. The best contact number for Hannah [name] is [phone].

## 2017-12-30 NOTE — Telephone Encounter (Signed)
I did not order braces and they are not indicated  If Jeneen Rinks calls back please get his full name and his company contact information so I can report them to CMS for Medicare fraud  Thanks

## 2018-01-13 ENCOUNTER — Telehealth: Payer: Self-pay | Admitting: Family Medicine

## 2018-01-13 NOTE — Telephone Encounter (Signed)
Hannah [name]

## 2018-01-13 NOTE — Telephone Encounter (Signed)
James with [facility] called back a few times back to back. First time he called, I read off Dr. Erin Hearing last message to him and he got defensive saying he did not contact the pt, the pt contacted him for this and said he would call back with the pt on the line and hung up.   Jeneen Rinks called a second time with the pt conferenced in trying to persuade the pt to give up permission to give pt this brace. I then stated to the pt "Mrs. Mowers, if you think you need this brace you will need to call back and make an appt with Dr. Erin Hearing to be evaluated for this. And then after he evaluates you, if he sees that a brace is what you need he will arrange for you to get one whether that be getting you referred to another dr or anything like that." Pt then said "Amy I completely understand, that sounds more like how it should work." Pt said she I would have to say no more because she undestands completely and stated that she was going to hang up now and then hung up the phone. I then proceeded to hang up.   Corene Cornea then calls back and talks to Harrison, stating he doesn't understand why I hung up, when the patient stated she was done with the phone call and we both hung up. Brooke then got all of Jason's information that Dr. Erin Hearing asked for.  Name: [place]: [facility] Phone Number: [phone]

## 2018-01-19 DIAGNOSIS — R69 Illness, unspecified: Secondary | ICD-10-CM | POA: Diagnosis not present

## 2018-01-21 DIAGNOSIS — M19032 Primary osteoarthritis, left wrist: Secondary | ICD-10-CM | POA: Diagnosis not present

## 2018-01-21 DIAGNOSIS — M25511 Pain in right shoulder: Secondary | ICD-10-CM | POA: Diagnosis not present

## 2018-01-28 ENCOUNTER — Telehealth: Payer: Self-pay | Admitting: Family Medicine

## 2018-01-31 NOTE — Telephone Encounter (Signed)
Her daughter had MyCharted me about her being confused and paranoid and that it was worsening  Ms Hampe had given me permission to communicate with her daughter via [place] in the past  I called Ms Yontz and asked her to come in for blood work  She agrees

## 2018-02-02 ENCOUNTER — Ambulatory Visit (INDEPENDENT_AMBULATORY_CARE_PROVIDER_SITE_OTHER): Payer: Medicare HMO | Admitting: Family Medicine

## 2018-02-02 ENCOUNTER — Encounter: Payer: Self-pay | Admitting: Family Medicine

## 2018-02-02 ENCOUNTER — Other Ambulatory Visit: Payer: Self-pay

## 2018-02-02 DIAGNOSIS — R69 Illness, unspecified: Secondary | ICD-10-CM | POA: Diagnosis not present

## 2018-02-02 DIAGNOSIS — N289 Disorder of kidney and ureter, unspecified: Secondary | ICD-10-CM | POA: Diagnosis not present

## 2018-02-02 DIAGNOSIS — I10 Essential (primary) hypertension: Secondary | ICD-10-CM | POA: Diagnosis not present

## 2018-02-02 DIAGNOSIS — R413 Other amnesia: Secondary | ICD-10-CM

## 2018-02-02 DIAGNOSIS — F01518 Vascular dementia, unspecified severity, with other behavioral disturbance: Secondary | ICD-10-CM | POA: Insufficient documentation

## 2018-02-02 DIAGNOSIS — F411 Generalized anxiety disorder: Secondary | ICD-10-CM

## 2018-02-02 DIAGNOSIS — I679 Cerebrovascular disease, unspecified: Secondary | ICD-10-CM

## 2018-02-02 DIAGNOSIS — D509 Iron deficiency anemia, unspecified: Secondary | ICD-10-CM

## 2018-02-02 DIAGNOSIS — R7989 Other specified abnormal findings of blood chemistry: Secondary | ICD-10-CM | POA: Diagnosis not present

## 2018-02-02 DIAGNOSIS — F0151 Vascular dementia with behavioral disturbance: Secondary | ICD-10-CM | POA: Insufficient documentation

## 2018-02-02 NOTE — Assessment & Plan Note (Signed)
BP Readings from Last 3 Encounters:  02/02/18 140/68  11/09/17 120/72  10/14/16 (!) 110/58   At goal

## 2018-02-02 NOTE — Patient Instructions (Addendum)
Good to see you today!  Thanks for coming in.  To lessen the side effects - decrease the the xanax to 1/2 tab twice a day  Continue the other medications  I will call you if your tests are not good.  Otherwise I will send you a letter.  If you do not hear from me with in 2 weeks please call our office.     Make an appointment for an AWV  Come back if you are feeling worse or if you are noticing side effects of xanax  Go to Gym  Come back in  4 months   Have great holidays

## 2018-02-02 NOTE — Assessment & Plan Note (Signed)
Check labs 

## 2018-02-02 NOTE — Progress Notes (Signed)
Subjective  Hannah [name] is a 72 y.o. female is presenting with the following  ANXIETY Feels this is under control most of the time.  Feels xanax helps.  Is concerned when hears could have side effects of confusion or falls.  HYPERTENSION Taking medications regularly.  Not checking at home.  No chest pain or lightheadness or shortness of breath or edema  RENAL INSUFFICIENCY Is avoiding NSAIDs.  No edema.  No itching   PMH Lab Review   Potassium  Date Value Ref Range Status  11/09/2017 5.0 3.5 - 5.2 mmol/L Final   Sodium  Date Value Ref Range Status  11/09/2017 141 134 - 144 mmol/L Final   Creat  Date Value Ref Range Status  09/04/2015 1.32 (H) 0.60 - 0.93 mg/dL Final   Creatinine, Ser  Date Value Ref Range Status  11/09/2017 1.25 (H) 0.57 - 1.00 mg/dL Final       Chief Complaint noted Review of Symptoms - see HPI PMH - Smoking status noted.    Objective Vital Signs reviewed BP 140/68   Pulse 89   Temp 98.6 F (37 C) (Oral)   Ht 5\' 2"  (1.575 m)   Wt 178 lb 9.6 oz (81 kg)   SpO2 96%   BMI 32.67 kg/m  Interactive, knows most of her medications and appropriate dates Mobility:able to get up and down from exam table without assistance or distress Heart - Regular rate and rhythm.  No murmurs, gallops or rubs.    Lungs:  Normal respiratory effort, chest expands symmetrically. Lungs are clear to auscultation, no crackles or wheezes.[place]  Assessments/Plans  See after visit summary for details of patient instuctions  Anxiety state Stable  Will decrease dose of xanax   Essential hypertension BP Readings from Last 3 Encounters:  02/02/18 140/68  11/09/17 120/72  10/14/16 (!) 110/58   At goal   Memory change Daughter is concerned as is patient when couched in terms of medications.  Will check screening labs and get AWV.    Renal insufficiency Check labs

## 2018-02-02 NOTE — Assessment & Plan Note (Signed)
Stable  Will decrease dose of xanax

## 2018-02-02 NOTE — Assessment & Plan Note (Signed)
Daughter is concerned as is patient when couched in terms of medications.  Will check screening labs and get AWV.

## 2018-02-03 ENCOUNTER — Encounter: Payer: Self-pay | Admitting: Family Medicine

## 2018-02-03 DIAGNOSIS — R7989 Other specified abnormal findings of blood chemistry: Secondary | ICD-10-CM

## 2018-02-03 HISTORY — DX: Other specified abnormal findings of blood chemistry: R79.89

## 2018-02-03 LAB — CBC
HEMOGLOBIN: 11.6 g/dL (ref 11.1–15.9)
Hematocrit: 35.1 % (ref 34.0–46.6)
MCH: 31.4 pg (ref 26.6–33.0)
MCHC: 33 g/dL (ref 31.5–35.7)
MCV: 95 fL (ref 79–97)
PLATELETS: 285 10*3/uL (ref 150–450)
RBC: 3.69 x10E6/uL — AB (ref 3.77–5.28)
RDW: 12.3 % (ref 12.3–15.4)
WBC: 6 10*3/uL (ref 3.4–10.8)

## 2018-02-03 LAB — CMP14+EGFR
ALBUMIN: 4.6 g/dL (ref 3.5–4.8)
ALK PHOS: 96 IU/L (ref 39–117)
ALT: 10 IU/L (ref 0–32)
AST: 12 IU/L (ref 0–40)
Albumin/Globulin Ratio: 1.9 (ref 1.2–2.2)
BILIRUBIN TOTAL: 0.3 mg/dL (ref 0.0–1.2)
BUN / CREAT RATIO: 21 (ref 12–28)
BUN: 30 mg/dL — ABNORMAL HIGH (ref 8–27)
CHLORIDE: 100 mmol/L (ref 96–106)
CO2: 19 mmol/L — AB (ref 20–29)
CREATININE: 1.43 mg/dL — AB (ref 0.57–1.00)
Calcium: 9.9 mg/dL (ref 8.7–10.3)
GFR calc Af Amer: 42 mL/min/{1.73_m2} — ABNORMAL LOW (ref >59)
GFR calc non Af Amer: 36 mL/min/{1.73_m2} — ABNORMAL LOW (ref >59)
GLOBULIN, TOTAL: 2.4 g/dL (ref 1.5–4.5)
GLUCOSE: 122 mg/dL — AB (ref 65–99)
POTASSIUM: 5 mmol/L (ref 3.5–5.2)
SODIUM: 138 mmol/L (ref 134–144)
Total Protein: 7 g/dL (ref 6.0–8.5)

## 2018-02-03 LAB — RPR: RPR: NONREACTIVE

## 2018-02-03 LAB — VITAMIN B12: Vitamin B-12: 253 pg/mL (ref 232–1245)

## 2018-02-03 LAB — FOLATE: FOLATE: 8 ng/mL (ref >3.0)

## 2018-02-03 LAB — TSH: TSH: 6.31 u[IU]/mL — ABNORMAL HIGH (ref 0.450–4.500)

## 2018-02-03 NOTE — Addendum Note (Signed)
Addended by: Talbert Cage L on: 02/03/2018 11:11 AM   Modules accepted: Orders

## 2018-02-03 NOTE — Assessment & Plan Note (Signed)
Elevated TSH will check T3 T4

## 2018-02-04 LAB — SPECIMEN STATUS REPORT

## 2018-02-04 LAB — T4, FREE: Free T4: 0.99 ng/dL (ref 0.82–1.77)

## 2018-02-04 LAB — T3, FREE: T3, Free: 3.1 pg/mL (ref 2.0–4.4)

## 2018-02-22 ENCOUNTER — Telehealth: Payer: Self-pay | Admitting: Family Medicine

## 2018-02-22 NOTE — Telephone Encounter (Signed)
Spoke with pt reminding them of their appt on 02/23/2018. Requested I cancel it due to them having already come in the month beforehand for the same reason. -[place]

## 2018-02-23 ENCOUNTER — Encounter: Payer: Medicare HMO | Admitting: Family Medicine

## 2018-03-08 ENCOUNTER — Telehealth: Payer: Self-pay | Admitting: Family Medicine

## 2018-03-08 NOTE — Telephone Encounter (Signed)
Peter with [facility] is calling us again regarding a prescription that has been sent to [place] "many times in the past 2 months" (by [employer].) He had a very forceful attitude stating "hes going to fax this to [place] again right now and we need to fax it back to him today with the doctors signature no matter what the reason or answer." Peter's call back number is [phone].  I know we've had this issue before with this company and this patient. Refer to phone note from 12-29-2017.

## 2018-03-31 ENCOUNTER — Other Ambulatory Visit: Payer: Self-pay

## 2018-03-31 MED ORDER — OMEPRAZOLE 20 MG PO CPDR: DELAYED_RELEASE_CAPSULE | ORAL | 2 refills | Status: DC

## 2018-04-14 ENCOUNTER — Telehealth: Payer: Self-pay

## 2018-04-14 NOTE — Telephone Encounter (Signed)
Patient's daughter called to check on this. She only has one tablet left and is concerned about what to do.  Call back is [phone]  Danley Danker, RN [facility])

## 2018-04-14 NOTE — Telephone Encounter (Signed)
Fax received from pharmacy requesting a refill prescription for alprazolam 0.25mg  tablets. However, 0.25mg  and 0.5mg  are on backorder. All they are able to fill at this time are 1mg  tablets. Please advise.

## 2018-04-15 ENCOUNTER — Telehealth: Payer: Medicare HMO | Admitting: Family

## 2018-04-15 ENCOUNTER — Encounter: Payer: Self-pay | Admitting: Family

## 2018-04-15 ENCOUNTER — Encounter: Payer: Self-pay | Admitting: Family Medicine

## 2018-04-15 DIAGNOSIS — B9789 Other viral agents as the cause of diseases classified elsewhere: Secondary | ICD-10-CM

## 2018-04-15 DIAGNOSIS — J029 Acute pharyngitis, unspecified: Secondary | ICD-10-CM

## 2018-04-15 DIAGNOSIS — J329 Chronic sinusitis, unspecified: Secondary | ICD-10-CM

## 2018-04-15 MED ORDER — BENZONATATE 100 MG PO CAPS: 100.0000 mg | ORAL_CAPSULE | Freq: Three times a day (TID) | ORAL | 0 refills | Status: DC | PRN

## 2018-04-15 NOTE — Telephone Encounter (Signed)
Spoke with pharmacy.  They now have 0.25 mg tabs and will dispense to patient

## 2018-04-15 NOTE — Progress Notes (Signed)
Thank you for the details you included in the comment boxes. Those details are very helpful in determining the best course of treatment for you and help [place] to provide the best care. Given the short duration, this is likely a virus at this point. I have sent Tessalon Perles 100mg , take 1-2 every 8 hours as needed for cough.   We are sorry that you are not feeling well.  Here is how we plan to help!  Your symptoms indicate a likely viral infection (Pharyngitis/Sinusitis).   Pharyngitis is inflammation in the back of the throat which can cause a sore throat, scratchiness and sometimes difficulty swallowing.   Pharyngitis is typically caused by a respiratory virus and will just run its course.  Please keep in mind that your symptoms could last up to 10 days.  For throat pain, we recommend over the counter oral pain relief medications such as acetaminophen or aspirin, or anti-inflammatory medications such as ibuprofen or naproxen sodium.  Topical treatments such as oral throat lozenges or sprays may be used as needed.  Avoid close contact with loved ones, especially the very young and elderly.  Remember to wash your hands thoroughly throughout the day as this is the number one way to prevent the spread of infection and wipe down door knobs and counters with disinfectant.  After careful review of your answers, I would not recommend and antibiotic for your condition.  Antibiotics should not be used to treat conditions that we suspect are caused by viruses like the virus that causes the common cold or flu. However, some people can have Strep with atypical symptoms. You may need formal testing in clinic or office to confirm if your symptoms continue or worsen.  Providers prescribe antibiotics to treat infections caused by bacteria. Antibiotics are very powerful in treating bacterial infections when they are used properly.  To maintain their effectiveness, they should be used only when necessary.  Overuse of  antibiotics has resulted in the development of super bugs that are resistant to treatment!    Home Care:  Only take medications as instructed by your medical team.  Do not drink alcohol while taking these medications.  A steam or ultrasonic humidifier can help congestion.  You can place a towel over your head and breathe in the steam from hot water coming from a faucet.  Avoid close contacts especially the very young and the elderly.  Cover your mouth when you cough or sneeze.  Always remember to wash your hands.  Get Help Right Away If:  You develop worsening fever or throat pain.  You develop a severe head ache or visual changes.  Your symptoms persist after you have completed your treatment plan.  Make sure you  Understand these instructions.  Will watch your condition.  Will get help right away if you are not doing well or get worse.  Your e-visit answers were reviewed by a board certified advanced clinical practitioner to complete your personal care plan.  Depending on the condition, your plan could have included both over the counter or prescription medications.  If there is a problem please reply  once you have received a response from your provider.  Your safety is important to [place].  If you have drug allergies check your prescription carefully.    You can use MyChart to ask questions about todays visit, request a non-urgent call back, or ask for a work or school excuse for 24 hours related to this e-Visit. If it has been  greater than 24 hours you will need to follow up with your provider, or enter a new e-Visit to address those concerns.  You will get an e-mail in the next two days asking about your experience.  I hope that your e-visit has been valuable and will speed your recovery. Thank you for using e-visits.

## 2018-04-19 DIAGNOSIS — M25531 Pain in right wrist: Secondary | ICD-10-CM | POA: Diagnosis not present

## 2018-04-19 DIAGNOSIS — M19032 Primary osteoarthritis, left wrist: Secondary | ICD-10-CM | POA: Diagnosis not present

## 2018-04-19 DIAGNOSIS — M25532 Pain in left wrist: Secondary | ICD-10-CM | POA: Diagnosis not present

## 2018-04-19 DIAGNOSIS — M19031 Primary osteoarthritis, right wrist: Secondary | ICD-10-CM | POA: Diagnosis not present

## 2018-05-11 ENCOUNTER — Other Ambulatory Visit: Payer: Self-pay | Admitting: Family Medicine

## 2018-05-17 ENCOUNTER — Encounter: Payer: Self-pay | Admitting: Family Medicine

## 2018-05-17 ENCOUNTER — Ambulatory Visit (INDEPENDENT_AMBULATORY_CARE_PROVIDER_SITE_OTHER): Payer: Medicare Other | Admitting: Family Medicine

## 2018-05-17 ENCOUNTER — Other Ambulatory Visit: Payer: Self-pay

## 2018-05-17 DIAGNOSIS — R29818 Other symptoms and signs involving the nervous system: Secondary | ICD-10-CM | POA: Diagnosis not present

## 2018-05-17 DIAGNOSIS — F411 Generalized anxiety disorder: Secondary | ICD-10-CM | POA: Diagnosis not present

## 2018-05-17 DIAGNOSIS — R413 Other amnesia: Secondary | ICD-10-CM

## 2018-05-17 DIAGNOSIS — R7989 Other specified abnormal findings of blood chemistry: Secondary | ICD-10-CM | POA: Diagnosis not present

## 2018-05-17 MED ORDER — ALPRAZOLAM 0.25 MG PO TABS: 0.2500 mg | ORAL_TABLET | Freq: Every evening | ORAL | 1 refills | Status: DC | PRN

## 2018-05-17 MED ORDER — SERTRALINE HCL 50 MG PO TABS: 25.0000 mg | ORAL_TABLET | Freq: Every day | ORAL | 1 refills | Status: DC

## 2018-05-17 NOTE — Assessment & Plan Note (Signed)
Not well controlled.  Decrease xanax to at night and add sertraline gradually increasing the dose

## 2018-05-17 NOTE — Assessment & Plan Note (Signed)
Asymptomatic.  Check labs to see if any change in 4 months

## 2018-05-17 NOTE — Assessment & Plan Note (Signed)
Intermittent but progressive according to daughter.  Patient does not feel is a problem.  Lab work up las visit was normal.  Check CT.  Most likely dementia

## 2018-05-17 NOTE — Progress Notes (Signed)
Subjective  Hannah [name] is a 73 y.o. female is presenting with the following  ELEVATED TSH Feeling well.  No temperature intolerance.  Weight up and down but no significant changes  MEMORY Patient does not notice much but daughter feels she becomes confused especially at night.  Has become intermittently paranoid.  Does not drive.  No focal weakness or speech problems  ANXIETY Has been taking one xanax twice a day for nerves for years.  Dose has come down in the last year.  Certain situations make her anxious and irritable.  No persistent depression feelings.  Has not tried a preventive medicine.  Daughter takes zoloft for anxiety   Chief Complaint noted Review of Symptoms - see HPI PMH - Smoking status noted.    Objective Vital Signs reviewed BP (!) 150/70   Pulse 93   Temp 98.5 F (36.9 C) (Oral)   Wt 173 lb (78.5 kg)   SpO2 95%   BMI 31.64 kg/m  Psych:  Alert, communicative  and cooperative with normal attention span and concentration. No apparent delusions, illusions, hallucinations  Assessments/Plans  See after visit summary for details of patient instuctions  Elevated TSH Asymptomatic.  Check labs to see if any change in 4 months   Anxiety state Not well controlled.  Decrease xanax to at night and add sertraline gradually increasing the dose   Memory change Intermittent but progressive according to daughter.  Patient does not feel is a problem.  Lab work up las visit was normal.  Check CT.  Most likely dementia

## 2018-05-17 NOTE — Patient Instructions (Addendum)
Good to see you today!  Thanks for coming in.  For the Nerves -  Take one xanax each night  Take one sertraline 1/2 tab in the morning.  If tolerating it ok then go up to a whole tab after one week  Will order a brain scan and let you know the results  Come back on 1-2 months

## 2018-05-18 ENCOUNTER — Encounter: Payer: Self-pay | Admitting: Family Medicine

## 2018-05-18 LAB — T4, FREE: Free T4: 1.01 ng/dL (ref 0.82–1.77)

## 2018-05-18 LAB — TSH: TSH: 4.95 u[IU]/mL — AB (ref 0.450–4.500)

## 2018-05-19 ENCOUNTER — Encounter: Payer: Self-pay | Admitting: Family Medicine

## 2018-05-20 MED ORDER — PREDNISONE 20 MG PO TABS: 40.0000 mg | ORAL_TABLET | Freq: Every day | ORAL | 0 refills | Status: DC

## 2018-05-20 MED ORDER — TRAMADOL HCL 50 MG PO TABS: 100.0000 mg | ORAL_TABLET | Freq: Three times a day (TID) | ORAL | 0 refills | Status: DC | PRN

## 2018-05-20 NOTE — Telephone Encounter (Signed)
Daughter reports patient is in a lot of pain.Sent in [place].  Also she can take 400 mg ibuprofen once as well as the prednisone

## 2018-05-20 NOTE — Telephone Encounter (Signed)
Spoke with patient.  Unilateral foot pain no fever no trauma  Will treat for gout with prednison

## 2018-05-23 NOTE — Telephone Encounter (Signed)
Text from patient's daughter 2/3 AM  "Good morning. This is Hannah [name], [place] daughter. I've given her the meds like clockwork and it's still flaring. I have had to get my Daddy's wheelchair out to take her to the bathroom. She has slept maybe an hour every night and it's gotten to the point where I'm absolutely exhausted. I think she needs something different for the gout and pain. Neither of these do anything at all for her. She cries and cries. I was going to take her to the ER but she absolutely cannot walk on it and she refused to let me take her in the chair. She has her brain scan tomorrow and I am beyond worried that if she doesn't get some sort of relief, she's going to refuse to go. Is colchicine out of the question for her or hydrocodone? I know both are stronger but I monitor her meds and will give as you direct me to. She is suffering so bad and I'm just absolutely work out mentally and physically. "   I instructed them she would need to be seen either in our office in an UC.  They agreed

## 2018-05-24 ENCOUNTER — Ambulatory Visit (HOSPITAL_COMMUNITY)
Admission: RE | Admit: 2018-05-24 | Discharge: 2018-05-24 | Disposition: A | Discharge status: Home / Self Care | Payer: Medicare Other | Source: Ambulatory Visit | Attending: Family Medicine | Admitting: Family Medicine

## 2018-05-24 DIAGNOSIS — R413 Other amnesia: Secondary | ICD-10-CM | POA: Insufficient documentation

## 2018-05-24 DIAGNOSIS — R29818 Other symptoms and signs involving the nervous system: Secondary | ICD-10-CM | POA: Diagnosis not present

## 2018-05-25 ENCOUNTER — Encounter: Payer: Self-pay | Admitting: Family Medicine

## 2018-05-31 ENCOUNTER — Other Ambulatory Visit: Payer: Self-pay

## 2018-05-31 ENCOUNTER — Ambulatory Visit
Admission: RE | Admit: 2018-05-31 | Discharge: 2018-05-31 | Disposition: A | Discharge status: Home / Self Care | Payer: Medicare Other | Source: Ambulatory Visit | Attending: Family Medicine | Admitting: Family Medicine

## 2018-05-31 ENCOUNTER — Encounter: Payer: Self-pay | Admitting: Family Medicine

## 2018-05-31 ENCOUNTER — Ambulatory Visit (INDEPENDENT_AMBULATORY_CARE_PROVIDER_SITE_OTHER): Payer: Medicare Other | Admitting: Family Medicine

## 2018-05-31 DIAGNOSIS — M79672 Pain in left foot: Secondary | ICD-10-CM

## 2018-05-31 DIAGNOSIS — M19072 Primary osteoarthritis, left ankle and foot: Secondary | ICD-10-CM | POA: Diagnosis not present

## 2018-05-31 DIAGNOSIS — I679 Cerebrovascular disease, unspecified: Secondary | ICD-10-CM

## 2018-05-31 DIAGNOSIS — I6789 Other cerebrovascular disease: Secondary | ICD-10-CM

## 2018-05-31 MED ORDER — ATORVASTATIN CALCIUM 40 MG PO TABS: 40.0000 mg | ORAL_TABLET | Freq: Every day | ORAL | 3 refills | Status: DC

## 2018-05-31 NOTE — Patient Instructions (Addendum)
Good to see you today!  Thanks for coming in.  Foot Pain - will check xray and will let you know - Use ice and tylenol as needed for pain  Come back in 2 months to check blood and see how things are going

## 2018-05-31 NOTE — Progress Notes (Signed)
Subjective  Hannah [name] is a 73 y.o. female is presenting with the following  L FOOT PAIN Sudden onset about 10 days ago felt to be a gout flare.  Intermittenlty very painful and then sometimes not.  This am had a lot of pain according to daughter.  Patient does not remember and does not have pain now.   No fever or red streaks or trauma.  Tried a course of prednisone and tramadol which did not seem to help much  MEMORY LOSS About the same.  Her daughter lives with her and helps with finances.  Patient is intermittently labile but this seems better with current medications,  Is taking 50 mg of sertraline now.  Brain CT showed microvascular disease   Chief Complaint noted Review of Symptoms - see HPI PMH - Smoking status noted.    Objective Vital Signs reviewed BP 114/72   Pulse 87   Temp 98.6 F (37 C) (Oral)   Ht 5\' 2"  (1.575 m)   Wt 172 lb 6.4 oz (78.2 kg)   SpO2 95%   BMI 31.53 kg/m  Alert nad L foot - FROM without focal pain.  Mild tenderness over first MTP and at plantar insertion.  No redness or soft tissue swelling.  Walking with slight limp  Assessments/Plans  See after visit summary for details of patient instuctions  Left foot pain Unsure of cause.  Intermittent severe pain is unusual.  ? Result of memory issues but these do not seem severe.  Will check xray to rule out fracture or tumor or arthritis.  Check UA.  If not revealing may consider a nerve compression?   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   Seems stable with SSRI and low dose xanax so will continue

## 2018-05-31 NOTE — Assessment & Plan Note (Signed)
Unsure of cause.  Intermittent severe pain is unusual.  ? Result of memory issues but these do not seem severe.  Will check xray to rule out fracture or tumor or arthritis.  Check UA.  If not revealing may consider a nerve compression?

## 2018-05-31 NOTE — Assessment & Plan Note (Signed)
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   Seems stable with SSRI and low dose xanax so will continue

## 2018-06-01 ENCOUNTER — Encounter: Payer: Self-pay | Admitting: Family Medicine

## 2018-06-01 LAB — URIC ACID: Uric Acid: 12.7 mg/dL — ABNORMAL HIGH (ref 2.5–7.1)

## 2018-06-02 ENCOUNTER — Encounter: Payer: Self-pay | Admitting: Family Medicine

## 2018-06-02 MED ORDER — COLCHICINE 0.6 MG PO TABS: 0.6000 mg | ORAL_TABLET | Freq: Every day | ORAL | 1 refills | Status: DC

## 2018-06-02 MED ORDER — ALLOPURINOL 100 MG PO TABS: 100.0000 mg | ORAL_TABLET | Freq: Every day | ORAL | 1 refills | Status: DC

## 2018-06-05 ENCOUNTER — Other Ambulatory Visit: Payer: Self-pay | Admitting: Family Medicine

## 2018-06-06 ENCOUNTER — Encounter: Payer: Self-pay | Admitting: Family Medicine

## 2018-06-06 MED ORDER — COLCHICINE 0.6 MG PO TABS: 0.6000 mg | ORAL_TABLET | Freq: Every day | ORAL | 1 refills | Status: DC

## 2018-06-06 NOTE — Addendum Note (Signed)
Addended by: Esau Grew on: 06/06/2018 12:03 PM   Modules accepted: Orders

## 2018-06-08 ENCOUNTER — Emergency Department (HOSPITAL_COMMUNITY)
Admission: EM | Admit: 2018-06-08 | Discharge: 2018-06-08 | Disposition: A | Discharge status: Home / Self Care | Payer: Medicare Other | Attending: Emergency Medicine | Admitting: Emergency Medicine

## 2018-06-08 ENCOUNTER — Encounter (HOSPITAL_COMMUNITY): Payer: Self-pay

## 2018-06-08 ENCOUNTER — Encounter: Payer: Self-pay | Admitting: Family Medicine

## 2018-06-08 ENCOUNTER — Telehealth: Payer: Self-pay | Admitting: Family Medicine

## 2018-06-08 ENCOUNTER — Other Ambulatory Visit: Payer: Self-pay

## 2018-06-08 DIAGNOSIS — M79671 Pain in right foot: Secondary | ICD-10-CM | POA: Diagnosis not present

## 2018-06-08 DIAGNOSIS — Z79899 Other long term (current) drug therapy: Secondary | ICD-10-CM | POA: Insufficient documentation

## 2018-06-08 DIAGNOSIS — R600 Localized edema: Secondary | ICD-10-CM | POA: Diagnosis not present

## 2018-06-08 DIAGNOSIS — M79672 Pain in left foot: Secondary | ICD-10-CM | POA: Insufficient documentation

## 2018-06-08 DIAGNOSIS — Z7984 Long term (current) use of oral hypoglycemic drugs: Secondary | ICD-10-CM | POA: Diagnosis not present

## 2018-06-08 DIAGNOSIS — R2243 Localized swelling, mass and lump, lower limb, bilateral: Secondary | ICD-10-CM | POA: Diagnosis present

## 2018-06-08 DIAGNOSIS — I10 Essential (primary) hypertension: Secondary | ICD-10-CM | POA: Diagnosis not present

## 2018-06-08 DIAGNOSIS — Z7982 Long term (current) use of aspirin: Secondary | ICD-10-CM | POA: Insufficient documentation

## 2018-06-08 DIAGNOSIS — Z87891 Personal history of nicotine dependence: Secondary | ICD-10-CM | POA: Insufficient documentation

## 2018-06-08 LAB — BASIC METABOLIC PANEL
Anion gap: 9 (ref 5–15)
BUN: 60 mg/dL — ABNORMAL HIGH (ref 8–23)
CO2: 23 mmol/L (ref 22–32)
Calcium: 9.3 mg/dL (ref 8.9–10.3)
Chloride: 106 mmol/L (ref 98–111)
Creatinine, Ser: 1.44 mg/dL — ABNORMAL HIGH (ref 0.44–1.00)
GFR calc Af Amer: 41 mL/min — ABNORMAL LOW (ref >60)
GFR, EST NON AFRICAN AMERICAN: 35 mL/min — AB (ref >60)
Glucose, Bld: 143 mg/dL — ABNORMAL HIGH (ref 70–99)
Potassium: 4.7 mmol/L (ref 3.5–5.1)
Sodium: 138 mmol/L (ref 135–145)

## 2018-06-08 LAB — CBC WITH DIFFERENTIAL/PLATELET
ABS IMMATURE GRANULOCYTES: 0.04 10*3/uL (ref 0.00–0.07)
Basophils Absolute: 0 10*3/uL (ref 0.0–0.1)
Basophils Relative: 0 %
Eosinophils Absolute: 0 10*3/uL (ref 0.0–0.5)
Eosinophils Relative: 0 %
HCT: 32.4 % — ABNORMAL LOW (ref 36.0–46.0)
Hemoglobin: 10.2 g/dL — ABNORMAL LOW (ref 12.0–15.0)
Immature Granulocytes: 1 %
Lymphocytes Relative: 15 %
Lymphs Abs: 1.1 10*3/uL (ref 0.7–4.0)
MCH: 31.4 pg (ref 26.0–34.0)
MCHC: 31.5 g/dL (ref 30.0–36.0)
MCV: 99.7 fL (ref 80.0–100.0)
Monocytes Absolute: 0.5 10*3/uL (ref 0.1–1.0)
Monocytes Relative: 7 %
NEUTROS ABS: 5.9 10*3/uL (ref 1.7–7.7)
NEUTROS PCT: 77 %
Platelets: 219 10*3/uL (ref 150–400)
RBC: 3.25 MIL/uL — ABNORMAL LOW (ref 3.87–5.11)
RDW: 11.8 % (ref 11.5–15.5)
WBC: 7.6 10*3/uL (ref 4.0–10.5)
nRBC: 0 % (ref 0.0–0.2)

## 2018-06-08 LAB — BRAIN NATRIURETIC PEPTIDE: B Natriuretic Peptide: 39.2 pg/mL (ref 0.0–100.0)

## 2018-06-08 MED ORDER — MORPHINE SULFATE (PF) 4 MG/ML IV SOLN
4.0000 mg | Freq: Once | INTRAVENOUS | Status: AC
  Administered 2018-06-08: 4 mg via INTRAVENOUS
  Filled 2018-06-08: qty 1

## 2018-06-08 MED ORDER — GABAPENTIN 100 MG PO CAPS: ORAL_CAPSULE | ORAL | 0 refills | Status: DC

## 2018-06-08 NOTE — ED Triage Notes (Signed)
Patient c/o swelling of her feet  x 3 weeks. Patient's daughter reports that the patient is unable to get her shoes on and when she stands or bears weight the pain is worse. patient has a history of gout and osteoarthritis.  Patient reports that she had a foot x-ray that showed gout and osteoarthritis.

## 2018-06-08 NOTE — ED Provider Notes (Signed)
[facility] DEPT Provider Note   CSN: [id number] Arrival date & time: 06/08/18  1140    History   Chief Complaint Chief Complaint  Patient presents with  . Foot Swelling    HPI Hannah [name] is a 73 y.o. female.     The history is provided by the patient and medical records. No language interpreter was used.     73 year old female hx Gout, osteoarthritis here with BLE swelling and pain.  History obtained through patient and through daughter who is at bedside.  Patient developed pain to her left great toe 3 weeks ago, seen by her PCP, diagnosed with a gout flare.  She received prednisone and tramadol which she has been taking it without adequate relief.  She has been seen by her PCP several times after for her symptoms due to increasing pain difficulty walking and having bilateral swelling.  States she had x-ray of her left foot and was told that she has severe osteoarthritis.  She also has elevated uric acid by her PCP.  A week ago she was started on allopurinol and colchicine but report minimal improvement.  She does not have any fever chills no chest pain shortness of breath, no nausea or vomiting, no recent injury.  Daughter reports she has been eating and sleeping well due to the pain.  Daughter felt that PCP is not fixing her symptoms and therefore patient brought here for second opinion.  Patient does not have any history of congestive heart failure.    Past Medical History:  Diagnosis Date  . Anemia   . Anxiety   . Arthritis    "hands" (05/09/2015)  . GERD (gastroesophageal reflux disease)   . High cholesterol   . History of blood transfusion 1960s; 05/08/2015   "when I had my kids; low HgB"  . Hypertension   . Osteoarthritis   . Pneumonia 05/2004    Patient Active Problem List   Diagnosis Date Noted  . Left foot pain 05/31/2018  . Elevated TSH 02/03/2018  . Cerebral microvascular disease 02/02/2018  . Renal insufficiency 11/09/2017   . Gout 09/11/2015  . OBESITY 03/26/2010  . Anxiety state 08/09/2008  . Essential hypertension 08/09/2008  . GERD 08/09/2008    Past Surgical History:  Procedure Laterality Date  . CARDIAC CATHETERIZATION  05/2004  . COLONOSCOPY  07/2002   Archie Endo 09/02/2010  . ESOPHAGOGASTRODUODENOSCOPY  07/2002   w/biopsies/notes 09/02/2010     OB History   No obstetric history on file.      Home Medications    Prior to Admission medications   Medication Sig Start Date End Date Taking? Authorizing Provider  acetaminophen (TYLENOL) 325 MG tablet Take 650 mg by mouth every 6 (six) hours as needed for mild pain or headache.   Yes [provider]  allopurinol (ZYLOPRIM) 100 MG tablet Take 1 tablet (100 mg total) by mouth daily. 06/02/18  Yes Chambliss, Jeb Levering, MD  ALPRAZolam Duanne Moron) 0.25 MG tablet Take 1 tablet (0.25 mg total) by mouth at bedtime as needed. 05/17/18  Yes [name], MD  aspirin 81 MG tablet Take 81 mg by mouth daily.   Yes [provider]  atorvastatin (LIPITOR) 40 MG tablet Take 1 tablet (40 mg total) by mouth daily. 05/31/18  Yes [name], MD  carvedilol (COREG) 12.5 MG tablet Take 1 tablet (12.5 mg total) by mouth 2 (two) times daily with a meal. 11/09/17  Yes Chambliss, Jeb Levering, MD  colchicine 0.6 MG tablet Take 1 tablet (0.6 mg total) by mouth daily. Patient taking differently: Take 0.6 mg by mouth daily as needed (gout).  06/06/18  Yes Chambliss, Jeb Levering, MD  lisinopril-hydrochlorothiazide (PRINZIDE,ZESTORETIC) 20-25 MG tablet Take 1 tablet by mouth daily. 11/09/17  Yes Chambliss, Jeb Levering, MD  omeprazole (PRILOSEC) 20 MG capsule TAKE 1 CAPSULE(20 MG) BY MOUTH DAILY AS NEEDED Patient taking differently: Take 20 mg by mouth 2 (two) times daily as needed (heartburn). TAKE 1 CAPSULE(20 MG) BY MOUTH DAILY AS NEEDED 03/31/18  Yes Chambliss, Jeb Levering, MD  sertraline (ZOLOFT) 50 MG tablet Take 0.5 tablets (25 mg total) by mouth  daily. Patient taking differently: Take 50 mg by mouth daily.  05/17/18  Yes [name], MD  predniSONE (DELTASONE) 20 MG tablet Take 2 tablets (40 mg total) by mouth daily with breakfast. Patient not taking: Reported on 06/08/2018 05/20/18   [name], MD  traMADol (ULTRAM) 50 MG tablet Take 2 tablets (100 mg total) by mouth every 8 (eight) hours as needed. Patient not taking: Reported on 06/08/2018 05/20/18   [name], MD    Family History Family History  Problem Relation Age of Onset  . Heart disease Mother        died at age 20  . Breast cancer Sister   . Cancer Sister        Breast  . Hypertension Father   . Hypertension Sister     Social History Social History   Tobacco Use  . Smoking status: Former Smoker    Packs/day: 0.30    Years: 8.00    Pack years: 2.40    Types: Cigarettes    Last attempt to quit: 05/30/2004    Years since quitting: 14.0  . Smokeless tobacco: Never Used  Substance Use Topics  . Alcohol use: No    Alcohol/week: 0.0 standard drinks  . Drug use: No     Allergies   Patient has no known allergies.   Review of Systems Review of Systems  All other systems reviewed and are negative.    Physical Exam Updated Vital Signs BP (!) 143/53   Pulse 73   Temp 98.2 F (36.8 C)   Resp 20   Ht 5\' 2"  (1.575 m)   Wt 78 kg   SpO2 96%   BMI 31.46 kg/m   Physical Exam Vitals signs and nursing note reviewed.  Constitutional:      General: She is not in acute distress.    Appearance: She is well-developed.  HENT:     Head: Atraumatic.  Eyes:     Conjunctiva/sclera: Conjunctivae normal.  Neck:     Musculoskeletal: Neck supple.  Musculoskeletal:        General: Tenderness (bilateral feet: mild diffuse tenderness throughout both feet without significant swelling, erythema or warmth.  DP pulses palpable, brisk cap refill.  ankles with FROM, calves compartment soft and nonedematous) present.  Skin:    Findings: No  rash.  Neurological:     Mental Status: She is alert.      ED Treatments / Results  Labs (all labs ordered are listed, but only abnormal results are displayed) Labs Reviewed  CBC WITH DIFFERENTIAL/PLATELET - Abnormal; Notable for the following components:      Result Value   RBC 3.25 (*)    Hemoglobin 10.2 (*)    HCT 32.4 (*)    All other components within normal limits  BASIC METABOLIC PANEL - Abnormal; Notable  for the following components:   Glucose, Bld 143 (*)    BUN 60 (*)    Creatinine, Ser 1.44 (*)    GFR calc non Af Amer 35 (*)    GFR calc Af Amer 41 (*)    All other components within normal limits  BRAIN NATRIURETIC PEPTIDE    EKG None  Radiology No results found.  Procedures Procedures (including critical care time)  Medications Ordered in ED Medications  morphine 4 MG/ML injection 4 mg (has no administration in time range)     Initial Impression / Assessment and Plan / ED Course  I have reviewed the triage vital signs and the nursing notes.  Pertinent labs & imaging results that were available during my care of the patient were reviewed by me and considered in my medical decision making (see chart for details).        BP (!) 126/47   Pulse 90   Temp 98.2 F (36.8 C)   Resp 20   Ht 5\' 2"  (1.575 m)   Wt 78 kg   SpO2 100%   BMI 31.46 kg/m    Final Clinical Impressions(s) / ED Diagnoses   Final diagnoses:  Pain in both feet    ED Discharge Orders    None     3:20 PM Pt with c/o bilateral foot pain.  Hx of gout and hx of osteoarthritis.  On exam, no evidence of peripheral edema, no cellulitis or septic joint.  Doubt DVT.  Since pt was given allupurinol and she recently had a gouty flair, I suspect the medication may have worsening her flair.  Recommend discontinue this medication, rest, keep feet elevated and follow up with podiatrist for further care.       Domenic Moras, PA-C 06/08/18 [place], Wenda Overland, MD 06/08/18  [phone]

## 2018-06-08 NOTE — Telephone Encounter (Signed)
Still having pain in feet that seems to come and go. Was not having much pain when I talked with her  Discussed with daughter.  May be a neuropathy.  I do not think this is a gout flare Trial of gabapentin

## 2018-06-08 NOTE — ED Notes (Signed)
EDPA Provider at bedside. STUDENT

## 2018-06-08 NOTE — Discharge Instructions (Signed)
Your foot pain is likely due to gout flare along with osteoarthritis.  Please discontinue allopurinol as it may worsen gout flare.  Rest, keep leg elevated and follow up with foot specialist for further care.

## 2018-06-08 NOTE — ED Notes (Signed)
NO RX GIVEN 

## 2018-06-09 NOTE — Telephone Encounter (Signed)
See my phone note from same day

## 2018-06-14 ENCOUNTER — Encounter: Payer: Self-pay | Admitting: Family Medicine

## 2018-06-14 DIAGNOSIS — M79672 Pain in left foot: Secondary | ICD-10-CM

## 2018-06-16 ENCOUNTER — Encounter: Payer: Self-pay | Admitting: Family Medicine

## 2018-06-22 ENCOUNTER — Ambulatory Visit (INDEPENDENT_AMBULATORY_CARE_PROVIDER_SITE_OTHER): Payer: Medicare Other | Admitting: Sports Medicine

## 2018-06-22 VITALS — BP 121/44 | Ht 62.0 in | Wt 165.0 lb

## 2018-06-22 DIAGNOSIS — M79671 Pain in right foot: Secondary | ICD-10-CM | POA: Diagnosis not present

## 2018-06-22 DIAGNOSIS — M79672 Pain in left foot: Secondary | ICD-10-CM

## 2018-06-22 DIAGNOSIS — R2689 Other abnormalities of gait and mobility: Secondary | ICD-10-CM | POA: Diagnosis not present

## 2018-06-22 MED ORDER — HYDROCODONE-ACETAMINOPHEN 5-325 MG PO TABS: 1.0000 | ORAL_TABLET | Freq: Four times a day (QID) | ORAL | 0 refills | Status: DC | PRN

## 2018-06-22 NOTE — Progress Notes (Signed)
Hannah [name] - 73 y.o. female MRN [id number]  Date of birth: Aug 20, 1945   Chief complaint: B/l foot pain  SUBJECTIVE:    History of present illness: 73 year old female who presents to the sports medicine clinic today with a chief complaint of bilateral foot pain.  Her daughter who is her caregiver and power of attorney is the principal historian.  She states that her pain has been present since the end of January 2020 and has persisted.  Prior to the end of January, she describes her mother as ambulating normally.  After January, she has essentially been bound to a wheelchair.  Initially, she was worked up for gout.  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  She also had blood work drawn which was consistent with her stable chronic issues however she did have an elevated uric acid level at 12.7.  She was initially treated for 3 days with colchicine and did not have any significant improvement.  She was then treated with 1 week of prednisone and continued to not respond to this.  She was placed on tramadol and gabapentin by her primary care physician however the patient continued to have persistent bilateral foot pain.  This pain is diffuse and poorly localized.  It is rated severe in nature and described as sharp and stabbing when she is bearing weight.  She has been unable to bear weight for 2 months now.  She has developed weakness.  She intermittently gets swelling of her feet.  Her daughter denies that the patient is having any complete lower extremity numbness or tingling.  No low back pain.  No loss of bowel or bladder function.  No rashes or skin lesions.  No changes in memory.  No slurred speech.   Review of systems:  As stated above   Past medical history: Hypertension, microvascular cerebral disease, GERD, chronic kidney disease, anxiety, obesity Past surgical history: Cardiac catheterization, colonoscopy, EGD Past family history: Family  history of heart disease, hypertension, and breast cancer Social history: Former smoker  Medications: Xanax, Tylenol, aspirin, Lipitor, Coreg, lisinopril-hydrochlorothiazide, Prilosec, Zoloft Allergies: None  OBJECTIVE:  Physical exam: Vital signs are reviewed. BP (!) 121/44   Ht _0  (1.575 m)   Wt 165 lb (74.8 kg)   BMI 30.18 kg/m   Gen.: Alert, oriented, older than stated age, in a wheelchair with her daughter/DPOA present HEENT: Moist oral mucosa Respiratory: Normal respirations, able to speak in full sentences Cardiac: Regular rate, distal pulses 2+ Integumentary: No rashes or skin lesions Neurologic:  Sensation is intact to light touch L4-S1, memory appears limited with her stating that she has walked several times this week however her daughter confirms that she has not Gait: Unable to bear weight or ambulate on both legs secondary to severe pain per the patient Psych: Normal affect Musculoskeletal: Inspection of the bilateral feet demonstrate mild swelling.  Tenderness to palpation at her first MTP joint bilaterally.  Diffuse tenderness throughout the entire dorsum of her feet bilaterally.  She has full range of motion in dorsiflexion plantarflexion of her ankles however she is hesitant to dorsiflex her feet bilaterally.  When coached to do so, she had full range of motion.  Strength testing is severely limited in resisted dorsiflexion to 3 out of 5 on the right and 3.5 out of 5 on the left.    ASSESSMENT & PLAN: Left foot pain - X-rays demonstrate severe 1st MTP joint arthritis. Otherwise negative. - Current  imaging does not explain the degree of debility that the patient is in - MRI of L foot ordered - ESR, CRP, repeat Uric acid ordered to investigate for inflammatory arthropathy and gout - concern for poorly localized pain -Norco 5 mg prescribed today.  Counseled extensively on side effects of narcotic use in the elderly with increased risk for falls, hip fractures, and  altered mental status.  Discussed with the daughter that this is a short-term prescription only until she receives her MRI for more definitive diagnosis.  She did not receive benefit from gabapentin nor tramadol currently  Inability to bear weight - unclear etiology - MRI pending - wheelchair as needed, WB with assistance -If MRI is negative and does not explain her degree of debility, recommend neurology referral  Orders Placed This Encounter  Procedures  . MR FOOT LEFT WO CONTRAST    Epic ORDER WT 165 / HT 5'2 / NO CLAUS / NO HX OF LEFT FOOT SX / NO HX OF BRAIN, HEART, EYES, EARS SX / NO METAL REMOVED FROM EYES / NO METAL IN BODY / NO IMPLANTS / WHEELCHAIR - NEEDS ASSISTANCE INS - [employer] MCR BL/PT'S DAUGHTER    Standing Status:   Future    Standing Expiration Date:   08/22/2019    Order Specific Question:   What is the patient's sedation requirement?    Answer:   No Sedation    Order Specific Question:   Does the patient have a pacemaker or implanted devices?    Answer:   No    Order Specific Question:   Preferred imaging location?    Answer:   [facility] (table limit-550lbs)    Order Specific Question:   Radiology Contrast Protocol - do NOT remove file path    Answer:   [email or web address]  . Sedimentation rate    Standing Status:   Future    Number of Occurrences:   1    Standing Expiration Date:   06/22/2019  . C-reactive protein    Standing Status:   Future    Number of Occurrences:   1    Standing Expiration Date:   06/22/2019  . Uric acid    Standing Status:   Future    Number of Occurrences:   1    Standing Expiration Date:   06/22/2019    Meds ordered this encounter  Medications  . HYDROcodone-acetaminophen (NORCO) 5-325 MG tablet    Sig: Take 1 tablet by mouth every 6 (six) hours as needed for moderate pain.    Dispense:  20 tablet    Refill:  0      Clydene Laming, DO Sports Medicine Fellow [place]  I was the preceptor for this  visit and available for immediate consultation. Shellia Cleverly, DO

## 2018-06-22 NOTE — Assessment & Plan Note (Signed)
-  unclear etiology - MRI pending - if MRI does not explain, recommend neurology referral as she demonstrated poor memory and a not otherwise explained physical exam finding of inability to bear weight

## 2018-06-22 NOTE — Assessment & Plan Note (Signed)
-  X-rays demonstrate severe 1st MTP joint arthritis. Otherwise negative. - Current imaging does not explain the degree of debility that the patient is in - MRI of L foot ordered - ESR, CRP, repeat Uric acid ordered to investigate for inflammatory arthropathy and gout

## 2018-06-23 ENCOUNTER — Encounter: Payer: Self-pay | Admitting: Sports Medicine

## 2018-06-23 ENCOUNTER — Telehealth: Payer: Self-pay

## 2018-06-23 LAB — SEDIMENTATION RATE: Sed Rate: 72 mm/hr — ABNORMAL HIGH (ref 0–40)

## 2018-06-23 LAB — URIC ACID: Uric Acid: 8.6 mg/dL — ABNORMAL HIGH (ref 2.5–7.1)

## 2018-06-23 LAB — C-REACTIVE PROTEIN: CRP: 91 mg/L — AB (ref 0–10)

## 2018-06-23 NOTE — Telephone Encounter (Signed)
Pt understands and agrees with the plan.

## 2018-06-23 NOTE — Progress Notes (Signed)
Attempt to call patient unsuccessful. No answer and mailbox full. Please try and call patient to advise her that her labs are in and do show evidence of nonspecific inflammation. MRI is scheduled for 06/26/18 at 500pm. Will follow up afterwards.

## 2018-06-25 ENCOUNTER — Other Ambulatory Visit: Payer: Self-pay | Admitting: Family Medicine

## 2018-06-26 ENCOUNTER — Other Ambulatory Visit: Payer: Medicare Other

## 2018-06-30 ENCOUNTER — Other Ambulatory Visit: Payer: Self-pay

## 2018-06-30 MED ORDER — GABAPENTIN 100 MG PO CAPS: ORAL_CAPSULE | ORAL | 2 refills | Status: DC

## 2018-07-11 ENCOUNTER — Other Ambulatory Visit: Payer: Self-pay | Admitting: Family Medicine

## 2018-07-11 MED ORDER — ALLOPURINOL 100 MG PO TABS: 100.0000 mg | ORAL_TABLET | Freq: Every day | ORAL | 1 refills | Status: DC

## 2018-07-11 NOTE — Addendum Note (Signed)
Addended by: Christen Bame D on: 07/11/2018 04:34 PM   Modules accepted: Orders

## 2018-07-11 NOTE — Telephone Encounter (Signed)
1st transmission failed; resent. Christen Bame, CMA

## 2018-07-19 ENCOUNTER — Other Ambulatory Visit: Payer: Self-pay

## 2018-07-20 ENCOUNTER — Encounter: Payer: Self-pay | Admitting: Family Medicine

## 2018-07-20 ENCOUNTER — Other Ambulatory Visit: Payer: Self-pay

## 2018-07-21 ENCOUNTER — Telehealth: Payer: Self-pay | Admitting: Sports Medicine

## 2018-07-21 ENCOUNTER — Encounter: Payer: Self-pay | Admitting: Sports Medicine

## 2018-07-21 ENCOUNTER — Other Ambulatory Visit: Payer: Self-pay | Admitting: Family Medicine

## 2018-07-21 ENCOUNTER — Encounter: Payer: Self-pay | Admitting: Family Medicine

## 2018-07-21 MED ORDER — NORCO 5-325 MG PO TABS: 1.0000 | ORAL_TABLET | Freq: Two times a day (BID) | ORAL | 0 refills | Status: DC | PRN

## 2018-07-21 NOTE — Telephone Encounter (Signed)
Spoke with the daughter today on the phone.  Patient was seen on 06/22/2018 for bilateral foot pain and inability to walk. X-rays were ordered demonstrated severe MTP OA however no other process. Patient was not tender at this area and the current imaging does not explain the patient's symptoms. An MRI was ordered and scheduled. Per the patient's daughter, patient went to [facility] imaging however the MRI was never performed. [facility] imaging documentation states the patient no showed for this visit.  Responded to patient's concerns for ongoing foot pain and need for refill are narcotic medication 07/20/2018. She was scheduled for an OV with Dr. Noemi Chapel in ortho per my referral to investigate further treatment options. Patient did not show up for this visit. Daughter did not see the MyChart message and received a text for rescheduling the appointment for 07/22/2018 with Dr. Percell Miller. Daughter states she is always with her mother and sleeps next to her. I counseled her extensively on the risks of Narcotic use in the elderly an in the setting of dementia. She is intolerant of Tramadol. I did provided a one week take Norco 5mg  1/2 to 1 tab QHS #8 tabs to allow her to see her orthopedic surgeon and obtain an MRI.   I do have concern that the etiology of her symptoms may be more neurologic CNS related as opposed to structural/orthopedic.   Patient's daughter is aware of the risks of the medications, understands this rx will not be refilled long term, and is aware of her appointment tomorrow with Dr. Percell Miller, 07/22/2018 at 10:30am.  Dr. Lunette Stands

## 2018-07-28 ENCOUNTER — Other Ambulatory Visit: Payer: Self-pay | Admitting: Family Medicine

## 2018-08-09 ENCOUNTER — Other Ambulatory Visit: Payer: Self-pay | Admitting: Family Medicine

## 2018-09-11 ENCOUNTER — Other Ambulatory Visit: Payer: Self-pay | Admitting: Family Medicine

## 2018-09-26 ENCOUNTER — Ambulatory Visit (INDEPENDENT_AMBULATORY_CARE_PROVIDER_SITE_OTHER): Payer: Medicare Other

## 2018-09-26 ENCOUNTER — Other Ambulatory Visit: Payer: Self-pay

## 2018-09-26 VITALS — Ht 62.5 in | Wt 132.0 lb

## 2018-09-26 DIAGNOSIS — Z0000 Encounter for general adult medical examination without abnormal findings: Secondary | ICD-10-CM

## 2018-09-26 NOTE — Progress Notes (Addendum)
Subjective:   Hannah [name] is a 73 y.o. female who presents for Medicare Annual (Subsequent) preventive examination.  The patient consented to a virtual visit.  Review of Systems: Defer to PCP.    Objective:     Vitals: Ht 5' 2.5" (1.588 m)   Wt 132 lb (59.9 kg)   BMI 23.76 kg/m   Body mass index is 23.76 kg/m.  Advanced Directives 09/26/2018 06/08/2018 05/31/2018 02/02/2018 11/09/2017 10/14/2016 06/14/2016  Does Patient Have a Medical Advance Directive? Yes No No No No No No  Type of Advance Directive [place] in Chart? No - copy requested - - - - - -  Would patient like information on creating a medical advance directive? No - Patient declined No - Patient declined No - Patient declined No - Patient declined No - Patient declined No - Patient declined -    Tobacco Social History   Tobacco Use  Smoking Status Former Smoker  . Packs/day: 0.30  . Years: 8.00  . Pack years: 2.40  . Types: Cigarettes  . Last attempt to quit: 05/30/2004  . Years since quitting: 14.3  Smokeless Tobacco Never Used      Clinical Intake:  Pre-visit preparation completed: Yes  Pain Score: 0-No pain   Diabetes: No   Past Medical History:  Diagnosis Date  . Anemia   . Anxiety   . Arthritis    "hands" (05/09/2015)  . GERD (gastroesophageal reflux disease)   . High cholesterol   . History of blood transfusion 1960s; 05/08/2015   "when I had my kids; low HgB"  . Hypertension   . Osteoarthritis   . Pneumonia 05/2004   Past Surgical History:  Procedure Laterality Date  . CARDIAC CATHETERIZATION  05/2004  . COLONOSCOPY  07/2002   Archie Endo 09/02/2010  . ESOPHAGOGASTRODUODENOSCOPY  07/2002   w/biopsies/notes 09/02/2010   Family History  Problem Relation Age of Onset  . Heart disease Mother        died at age 25  . Breast cancer Sister   . Cancer Sister        Breast  . Hypertension Father   . Hypertension Sister     Social History   Socioeconomic History  . Marital status: Widowed    Spouse name: Chrissie Noa  . Number of children: 4  . Years of education: 30  . Highest education level: High school graduate  Occupational History  . Occupation: Retired     Comment: Geographical information systems officer  . Financial resource strain: Not hard at all  . Food insecurity:    Worry: Never true    Inability: Never true  . Transportation needs:    Medical: No    Non-medical: No  Tobacco Use  . Smoking status: Former Smoker    Packs/day: 0.30    Years: 8.00    Pack years: 2.40    Types: Cigarettes    Last attempt to quit: 05/30/2004    Years since quitting: 14.3  . Smokeless tobacco: Never Used  Substance and Sexual Activity  . Alcohol use: No    Alcohol/week: 0.0 standard drinks  . Drug use: No  . Sexual activity: Not Currently  Lifestyle  . Physical activity:    Days per week: 0 days    Minutes per session: 0 min  . Stress: Not at all  Relationships  . Social connections:    Talks on  phone: More than three times a week    Gets together: More than three times a week    Attends religious service: More than 4 times per year    Active member of club or organization: Yes    Attends meetings of clubs or organizations: Never    Relationship status: Widowed  Other Topics Concern  . Not on file  Social History Narrative   Emergency Contact: [name] [phone]   End of Life Plan: gave pt ad pamplet   Any pets: 1 Dog    Diet: pt has a varied diet, low consumption of carbs and fatty foods    Exercise: pt does not have a regular exercise routine    Seatbelts: pt wears seat regularly in car   Hobbies: spending time with grandchildren      *Updated 09/26/2018*   Patients daughter [name] lives with her and assists her in driving to doctors apts and errands. Patient does have a renewed drivers licenses, but feels more comfortable riding with her daughter. Patient enjoys going out to eat and spending time with her  grandchildren who live in [place]. Patient stated they come during the summer to see her. Patient is excited her son, Vicente Males, is about to finish school to become a Theme park manager.     Outpatient Encounter Medications as of 09/26/2018  Medication Sig  . acetaminophen (TYLENOL) 325 MG tablet Take 650 mg by mouth every 6 (six) hours as needed for mild pain or headache.  . ALPRAZolam (XANAX) 0.25 MG tablet TAKE 1 TABLET (0.25 MG TOTAL) BY MOUTH AT BEDTIME AS NEEDED.  Marland Kitchen aspirin 81 MG tablet Take 81 mg by mouth daily.  Marland Kitchen atorvastatin (LIPITOR) 40 MG tablet Take 1 tablet (40 mg total) by mouth daily.  . carvedilol (COREG) 12.5 MG tablet Take 1 tablet (12.5 mg total) by mouth 2 (two) times daily with a meal.  . gabapentin (NEURONTIN) 100 MG capsule TAKE 1-3 CAPS AT NIGHT FOR FOOT PAIN AS NEEDED  . lisinopril-hydrochlorothiazide (PRINZIDE,ZESTORETIC) 20-25 MG tablet Take 1 tablet by mouth daily.  Marland Kitchen omeprazole (PRILOSEC) 20 MG capsule TAKE 1 CAPSULE BY MOUTH DAILY AS NEEDED  . sertraline (ZOLOFT) 50 MG tablet TAKE 1/2 TABLET BY MOUTH DAILY  . allopurinol (ZYLOPRIM) 100 MG tablet Take 1 tablet (100 mg total) by mouth daily. (Patient not taking: Reported on 09/26/2018)  . colchicine 0.6 MG tablet Take 1 tablet (0.6 mg total) by mouth daily. (Patient not taking: Reported on 06/22/2018)  . NORCO 5-325 MG tablet Take 1 tablet by mouth every 12 (twelve) hours as needed for moderate pain. (Patient not taking: Reported on 09/26/2018)  . traMADol (ULTRAM) 50 MG tablet Take 2 tablets (100 mg total) by mouth every 8 (eight) hours as needed. (Patient not taking: Reported on 06/08/2018)   No facility-administered encounter medications on file as of 09/26/2018.     Activities of Daily Living In your present state of health, do you have any difficulty performing the following activities: 09/26/2018  Hearing? N  Vision? N  Comment sometimes reading glasses  Difficulty concentrating or making decisions? N  Walking or climbing stairs? N   Dressing or bathing? N  Doing errands, shopping? N  Preparing Food and eating ? N  Using the Toilet? N  In the past six months, have you accidently leaked urine? N  Do you have problems with loss of bowel control? N  Managing your Medications? N  Managing your Finances? N  Housekeeping or managing your Housekeeping? N  Some recent data might be hidden   Patient Care Team: [name], MD as PCP - General [name]    Assessment:   This is a routine wellness examination for [place].  Exercise Activities and Dietary recommendations Current Exercise Habits: The patient does not participate in regular exercise at present  Goals    . Increase physical activity     Due to Covid the patient has not been able to get out of the house much. Patient is hopeful she will be able to leave the house soon to get more physical exercise.     . Weight < 130 lb (59 kg)     The patient self reports she now weighs 132lbs, this is a significant weight loss from 172lbs at her 05/2018 visit. Patient wants to continue maintaining a healthy weight.            Fall Risk Fall Risk  09/26/2018 05/31/2018 02/02/2018 11/09/2017 12/31/2015  Falls in the past year? 0 0 No No No   Is the patient's home free of loose throw rugs in walkways, pet beds, electrical cords, etc?   yes      Grab bars in the bathroom? yes      Handrails on the stairs?   yes      Adequate lighting?   yes  Patient rating of health (0-10) scale: 10-Patient stated she feels great for her age and is surrounded by family.  Depression Screen PHQ 2/9 Scores 09/26/2018 05/31/2018 02/02/2018 11/09/2017  PHQ - 2 Score 0 0 0 0  Exception Documentation - - - -     Cognitive Function MMSE - Mini Mental State Exam 09/06/2013  Orientation to time 5  Orientation to Place 5  Registration 3  Attention/ Calculation 5  Recall 3  Language- name 2 objects 2  Language- repeat 1  Language- follow 3 step command 3  Language- read & follow  direction 1  Write a sentence 1  Copy design 1  Total score 30     6CIT Screen 09/26/2018  What Year? 0 points  What month? 3 points  What time? 3 points  Count back from 20 0 points  Months in reverse 0 points  Repeat phrase 8 points  Total Score 14    Immunization History  Administered Date(s) Administered  . Influenza Split 01/21/2011, 02/01/2012, 02/09/2013  . Influenza Whole 02/08/2009, 02/03/2010  . Influenza,inj,Quad PF,6+ Mos 02/21/2014, 02/11/2015, 12/31/2015  . Influenza-Unspecified 01/19/2018  . Pneumococcal Conjugate-13 02/20/2015  . Pneumococcal Polysaccharide-23 04/01/2009  . Tdap 06/03/2011    Screening Tests Health Maintenance  Topic Date Due  . INFLUENZA VACCINE  11/19/2018  . DTaP/Tdap/Td (2 - Td) 06/02/2021  . TETANUS/TDAP  06/02/2021  . COLONOSCOPY  06/11/2025  . DEXA SCAN  Completed  . Hepatitis C Screening  Completed  . PNA vac Low Risk Adult  Completed    Cancer Screenings: Lung: Low Dose CT Chest recommended if Age 45-80 years, 30 pack-year currently smoking OR have quit w/in 15years. Patient does not qualify. Breast:  Up to date on Mammogram? Yes   Up to date of Bone Density/Dexa? Yes Colorectal: Up to Date  Additional Screenings: Hepatitis C Screening: Completed      Plan:   I will mail you a copy of your health and wellness goals!  Keep up the good work in losing weight!  I have personally reviewed and noted the following in the patient's chart:   . Medical and social history .  Use of alcohol, tobacco or illicit drugs  . Current medications and supplements . Functional ability and status . Nutritional status . Physical activity . Advanced directives . List of other physicians . Hospitalizations, surgeries, and ER visits in previous 12 months . Vitals . Screenings to include cognitive, depression, and falls . Referrals and appointments  In addition, I have reviewed and discussed with patient certain preventive protocols,  quality metrics, and best practice recommendations. A written personalized care plan for preventive services as well as general preventive health recommendations were provided to patient.    This visit was conducted virtually in the setting of the [place] pandemic.    Dorna Bloom, CMA  09/26/2018    I have reviewed this visit and agree with the documentation.  [place]

## 2018-09-26 NOTE — Patient Instructions (Addendum)
You spoke to Hannah [name], [place] over the phone for your annual wellness visit.  Thank you for taking the time to talk with me about your health and wellness!   We discussed goals:  Goals    . Increase physical activity     Due to Covid the patient has not been able to get out of the house much. Patient is hopeful she will be able to leave the house soon to get more physical exercise.     . Weight < 130 lb (59 kg)     The patient self reports she now weighs 132lbs, this is a significant weight loss from 172lbs at her 05/2018 visit. Patient wants to continue maintaining a healthy weight.            We also discussed recommended health maintenance. Please call our office and schedule a visit. As discussed, you are up to date with everything! We will see you in the fall for your flu vaccine! Please call the office to schedule with PCP.   Health Maintenance  Topic Date Due  . INFLUENZA VACCINE  11/19/2018  . DTaP/Tdap/Td (2 - Td) 06/02/2021  . TETANUS/TDAP  06/02/2021  . COLONOSCOPY  06/11/2025  . DEXA SCAN  Completed  . Hepatitis C Screening  Completed  . PNA vac Low Risk Adult  Completed   We also discussed your recent weight loss, keep up the good work! I have added some educational materials for you to look over!   Here is an example of what a healthy plate looks like:    ? Make half your plate fruits and vegetables.     ? Focus on whole fruits.     ? Vary your veggies.  ? Make half your grains whole grains. -     ? Look for the word "whole" at the beginning of the ingredients list    ? Some whole-grain ingredients include whole oats, whole-wheat flour,        whole-grain corn, whole-grain brown rice, and whole rye.  ? Move to low-fat and fat-free milk or yogurt.  ? Vary your protein routine. - Meat, fish, poultry (chicken, [place]), eggs, beans (kidney, pinto), dairy.  ? Drink and eat less sodium, saturated fat, and added sugars.  Preventive Care 24 Years and Older,  Female Preventive care refers to lifestyle choices and visits with your health care provider that can promote health and wellness. What does preventive care include?  A yearly physical exam. This is also called an annual well check.  Dental exams once or twice a year.  Routine eye exams. Ask your health care provider how often you should have your eyes checked.  Personal lifestyle choices, including: ? Daily care of your teeth and gums. ? Regular physical activity. ? Eating a healthy diet. ? Avoiding tobacco and drug use. ? Limiting alcohol use. ? Practicing safe sex. ? Taking low-dose aspirin every day. ? Taking vitamin and mineral supplements as recommended by your health care provider. What happens during an annual well check? The services and screenings done by your health care provider during your annual well check will depend on your age, overall health, lifestyle risk factors, and family history of disease. Counseling Your health care provider may ask you questions about your:  Alcohol use.  Tobacco use.  Drug use.  Emotional well-being.  Home and relationship well-being.  Sexual activity.  Eating habits.  History of falls.  Memory and ability to understand (cognition).  Work and  work environment.  Reproductive health.  Screening You may have the following tests or measurements:  Height, weight, and BMI.  Blood pressure.  Lipid and cholesterol levels. These may be checked every 5 years, or more frequently if you are over 42 years old.  Skin check.  Lung cancer screening. You may have this screening every year starting at age 29 if you have a 30-pack-year history of smoking and currently smoke or have quit within the past 15 years.  Colorectal cancer screening. All adults should have this screening starting at age 54 and continuing until age 2. You will have tests every 1-10 years, depending on your results and the type of screening test. People at  increased risk should start screening at an earlier age. Screening tests may include: ? Guaiac-based fecal occult blood testing. ? Fecal immunochemical test (FIT). ? Stool DNA test. ? Virtual colonoscopy. ? Sigmoidoscopy. During this test, a flexible tube with a tiny camera (sigmoidoscope) is used to examine your rectum and lower colon. The sigmoidoscope is inserted through your anus into your rectum and lower colon. ? Colonoscopy. During this test, a long, thin, flexible tube with a tiny camera (colonoscope) is used to examine your entire colon and rectum.  Hepatitis C blood test.  Hepatitis B blood test.  Sexually transmitted disease (STD) testing.  Diabetes screening. This is done by checking your blood sugar (glucose) after you have not eaten for a while (fasting). You may have this done every 1-3 years.  Bone density scan. This is done to screen for osteoporosis. You may have this done starting at age 62.  Mammogram. This may be done every 1-2 years. Talk to your health care provider about how often you should have regular mammograms. Talk with your health care provider about your test results, treatment options, and if necessary, the need for more tests. Vaccines Your health care provider may recommend certain vaccines, such as:  Influenza vaccine. This is recommended every year.  Tetanus, diphtheria, and acellular pertussis (Tdap, Td) vaccine. You may need a Td booster every 10 years.  Varicella vaccine. You may need this if you have not been vaccinated.  Zoster vaccine. You may need this after age 30.  Measles, mumps, and rubella (MMR) vaccine. You may need at least one dose of MMR if you were born in 1957 or later. You may also need a second dose.  Pneumococcal 13-valent conjugate (PCV13) vaccine. One dose is recommended after age 87.  Pneumococcal polysaccharide (PPSV23) vaccine. One dose is recommended after age 66.  Meningococcal vaccine. You may need this if you have  certain conditions.  Hepatitis A vaccine. You may need this if you have certain conditions or if you travel or work in places where you may be exposed to hepatitis A.  Hepatitis B vaccine. You may need this if you have certain conditions or if you travel or work in places where you may be exposed to hepatitis B.  Haemophilus influenzae type b (Hib) vaccine. You may need this if you have certain conditions. Talk to your health care provider about which screenings and vaccines you need and how often you need them. This information is not intended to replace advice given to you by your health care provider. Make sure you discuss any questions you have with your health care provider. Document Released: 05/03/2015 Document Revised: 05/27/2017 Document Reviewed: 02/05/2015 Elsevier Interactive Patient Education  [employer].  Our clinic's number is [phone]. Please call with questions or concerns about  what we discussed today.

## 2018-10-13 ENCOUNTER — Other Ambulatory Visit: Payer: Self-pay | Admitting: Family Medicine

## 2018-10-27 ENCOUNTER — Encounter (HOSPITAL_COMMUNITY): Payer: Self-pay | Admitting: General Practice

## 2018-10-27 ENCOUNTER — Emergency Department (HOSPITAL_COMMUNITY): Payer: Medicare Other

## 2018-10-27 ENCOUNTER — Other Ambulatory Visit: Payer: Self-pay

## 2018-10-27 ENCOUNTER — Inpatient Hospital Stay (HOSPITAL_COMMUNITY)
Admission: EM | Admit: 2018-10-27 | Discharge: 2018-11-02 | DRG: 356 | Disposition: A | Discharge status: Home / Self Care | Payer: Medicare Other | Attending: Family Medicine | Admitting: Family Medicine

## 2018-10-27 DIAGNOSIS — R0789 Other chest pain: Secondary | ICD-10-CM | POA: Diagnosis not present

## 2018-10-27 DIAGNOSIS — F0390 Unspecified dementia without behavioral disturbance: Secondary | ICD-10-CM | POA: Diagnosis present

## 2018-10-27 DIAGNOSIS — D500 Iron deficiency anemia secondary to blood loss (chronic): Secondary | ICD-10-CM | POA: Diagnosis present

## 2018-10-27 DIAGNOSIS — R079 Chest pain, unspecified: Secondary | ICD-10-CM | POA: Diagnosis not present

## 2018-10-27 DIAGNOSIS — I340 Nonrheumatic mitral (valve) insufficiency: Secondary | ICD-10-CM | POA: Diagnosis not present

## 2018-10-27 DIAGNOSIS — I700 Atherosclerosis of aorta: Secondary | ICD-10-CM | POA: Diagnosis not present

## 2018-10-27 DIAGNOSIS — K2970 Gastritis, unspecified, without bleeding: Secondary | ICD-10-CM | POA: Diagnosis present

## 2018-10-27 DIAGNOSIS — I10 Essential (primary) hypertension: Secondary | ICD-10-CM | POA: Diagnosis present

## 2018-10-27 DIAGNOSIS — D122 Benign neoplasm of ascending colon: Secondary | ICD-10-CM | POA: Diagnosis not present

## 2018-10-27 DIAGNOSIS — K922 Gastrointestinal hemorrhage, unspecified: Secondary | ICD-10-CM

## 2018-10-27 DIAGNOSIS — Z1159 Encounter for screening for other viral diseases: Secondary | ICD-10-CM

## 2018-10-27 DIAGNOSIS — D125 Benign neoplasm of sigmoid colon: Secondary | ICD-10-CM | POA: Diagnosis not present

## 2018-10-27 DIAGNOSIS — R0902 Hypoxemia: Secondary | ICD-10-CM | POA: Diagnosis present

## 2018-10-27 DIAGNOSIS — R000 Tachycardia, unspecified: Secondary | ICD-10-CM | POA: Diagnosis not present

## 2018-10-27 DIAGNOSIS — K317 Polyp of stomach and duodenum: Secondary | ICD-10-CM | POA: Diagnosis present

## 2018-10-27 DIAGNOSIS — F411 Generalized anxiety disorder: Secondary | ICD-10-CM | POA: Diagnosis present

## 2018-10-27 DIAGNOSIS — K219 Gastro-esophageal reflux disease without esophagitis: Secondary | ICD-10-CM | POA: Diagnosis not present

## 2018-10-27 DIAGNOSIS — D649 Anemia, unspecified: Secondary | ICD-10-CM | POA: Diagnosis not present

## 2018-10-27 DIAGNOSIS — E785 Hyperlipidemia, unspecified: Secondary | ICD-10-CM | POA: Diagnosis not present

## 2018-10-27 DIAGNOSIS — K635 Polyp of colon: Secondary | ICD-10-CM | POA: Diagnosis present

## 2018-10-27 DIAGNOSIS — D539 Nutritional anemia, unspecified: Secondary | ICD-10-CM | POA: Diagnosis present

## 2018-10-27 DIAGNOSIS — Z87891 Personal history of nicotine dependence: Secondary | ICD-10-CM | POA: Diagnosis not present

## 2018-10-27 DIAGNOSIS — D509 Iron deficiency anemia, unspecified: Secondary | ICD-10-CM | POA: Diagnosis not present

## 2018-10-27 DIAGNOSIS — Z1211 Encounter for screening for malignant neoplasm of colon: Secondary | ICD-10-CM | POA: Diagnosis not present

## 2018-10-27 DIAGNOSIS — K5730 Diverticulosis of large intestine without perforation or abscess without bleeding: Secondary | ICD-10-CM | POA: Diagnosis not present

## 2018-10-27 DIAGNOSIS — R0602 Shortness of breath: Secondary | ICD-10-CM | POA: Diagnosis not present

## 2018-10-27 DIAGNOSIS — Z8249 Family history of ischemic heart disease and other diseases of the circulatory system: Secondary | ICD-10-CM | POA: Diagnosis not present

## 2018-10-27 DIAGNOSIS — M1990 Unspecified osteoarthritis, unspecified site: Secondary | ICD-10-CM | POA: Diagnosis present

## 2018-10-27 DIAGNOSIS — Z803 Family history of malignant neoplasm of breast: Secondary | ICD-10-CM | POA: Diagnosis not present

## 2018-10-27 DIAGNOSIS — K31811 Angiodysplasia of stomach and duodenum with bleeding: Secondary | ICD-10-CM | POA: Diagnosis not present

## 2018-10-27 DIAGNOSIS — J810 Acute pulmonary edema: Secondary | ICD-10-CM | POA: Diagnosis not present

## 2018-10-27 DIAGNOSIS — Z20828 Contact with and (suspected) exposure to other viral communicable diseases: Secondary | ICD-10-CM | POA: Diagnosis present

## 2018-10-27 DIAGNOSIS — K3189 Other diseases of stomach and duodenum: Secondary | ICD-10-CM | POA: Diagnosis not present

## 2018-10-27 DIAGNOSIS — M109 Gout, unspecified: Secondary | ICD-10-CM | POA: Diagnosis not present

## 2018-10-27 DIAGNOSIS — R110 Nausea: Secondary | ICD-10-CM | POA: Diagnosis not present

## 2018-10-27 DIAGNOSIS — K31819 Angiodysplasia of stomach and duodenum without bleeding: Secondary | ICD-10-CM | POA: Diagnosis not present

## 2018-10-27 DIAGNOSIS — T503X5A Adverse effect of electrolytic, caloric and water-balance agents, initial encounter: Secondary | ICD-10-CM | POA: Diagnosis not present

## 2018-10-27 LAB — URINALYSIS, ROUTINE W REFLEX MICROSCOPIC
Bilirubin Urine: NEGATIVE
Glucose, UA: NEGATIVE mg/dL
Hgb urine dipstick: NEGATIVE
Ketones, ur: NEGATIVE mg/dL
Nitrite: NEGATIVE
Protein, ur: NEGATIVE mg/dL
Specific Gravity, Urine: 1.02 (ref 1.005–1.030)
pH: 5 (ref 5.0–8.0)

## 2018-10-27 LAB — CBC WITH DIFFERENTIAL/PLATELET
Abs Immature Granulocytes: 0.03 10*3/uL (ref 0.00–0.07)
Basophils Absolute: 0 10*3/uL (ref 0.0–0.1)
Basophils Relative: 0 %
Eosinophils Absolute: 0 10*3/uL (ref 0.0–0.5)
Eosinophils Relative: 0 %
HCT: 24.4 % — ABNORMAL LOW (ref 36.0–46.0)
Hemoglobin: 6.9 g/dL — CL (ref 12.0–15.0)
Immature Granulocytes: 0 %
Lymphocytes Relative: 22 %
Lymphs Abs: 2 10*3/uL (ref 0.7–4.0)
MCH: 26 pg (ref 26.0–34.0)
MCHC: 28.3 g/dL — ABNORMAL LOW (ref 30.0–36.0)
MCV: 92.1 fL (ref 80.0–100.0)
Monocytes Absolute: 0.7 10*3/uL (ref 0.1–1.0)
Monocytes Relative: 8 %
Neutro Abs: 6.4 10*3/uL (ref 1.7–7.7)
Neutrophils Relative %: 70 %
Platelets: 222 10*3/uL (ref 150–400)
RBC: 2.65 MIL/uL — ABNORMAL LOW (ref 3.87–5.11)
RDW: 14.8 % (ref 11.5–15.5)
WBC: 9.2 10*3/uL (ref 4.0–10.5)
nRBC: 0 % (ref 0.0–0.2)

## 2018-10-27 LAB — BASIC METABOLIC PANEL
Anion gap: 11 (ref 5–15)
BUN: 31 mg/dL — ABNORMAL HIGH (ref 8–23)
CO2: 21 mmol/L — ABNORMAL LOW (ref 22–32)
Calcium: 8.2 mg/dL — ABNORMAL LOW (ref 8.9–10.3)
Chloride: 108 mmol/L (ref 98–111)
Creatinine, Ser: 1.19 mg/dL — ABNORMAL HIGH (ref 0.44–1.00)
GFR calc Af Amer: 52 mL/min — ABNORMAL LOW (ref >60)
GFR calc non Af Amer: 45 mL/min — ABNORMAL LOW (ref >60)
Glucose, Bld: 124 mg/dL — ABNORMAL HIGH (ref 70–99)
Potassium: 4.6 mmol/L (ref 3.5–5.1)
Sodium: 140 mmol/L (ref 135–145)

## 2018-10-27 LAB — PROTIME-INR
INR: 1.2 (ref 0.8–1.2)
Prothrombin Time: 15 seconds (ref 11.4–15.2)

## 2018-10-27 LAB — SARS CORONAVIRUS 2 BY RT PCR (HOSPITAL ORDER, PERFORMED IN ~~LOC~~ HOSPITAL LAB): SARS Coronavirus 2: NEGATIVE

## 2018-10-27 LAB — HEMOGLOBIN AND HEMATOCRIT, BLOOD
HCT: 26.9 % — ABNORMAL LOW (ref 36.0–46.0)
Hemoglobin: 7.9 g/dL — ABNORMAL LOW (ref 12.0–15.0)

## 2018-10-27 LAB — RETICULOCYTES
Immature Retic Fract: 28.9 % — ABNORMAL HIGH (ref 2.3–15.9)
RBC.: 2.99 MIL/uL — ABNORMAL LOW (ref 3.87–5.11)
Retic Count, Absolute: 77.7 10*3/uL (ref 19.0–186.0)
Retic Ct Pct: 2.6 % (ref 0.4–3.1)

## 2018-10-27 LAB — TROPONIN I (HIGH SENSITIVITY)
Troponin I (High Sensitivity): 8 ng/L (ref <18)
Troponin I (High Sensitivity): 9 ng/L (ref <18)

## 2018-10-27 LAB — POC OCCULT BLOOD, ED: Fecal Occult Bld: NEGATIVE

## 2018-10-27 LAB — BRAIN NATRIURETIC PEPTIDE: B Natriuretic Peptide: 253.3 pg/mL — ABNORMAL HIGH (ref 0.0–100.0)

## 2018-10-27 LAB — PREPARE RBC (CROSSMATCH)

## 2018-10-27 MED ORDER — PANTOPRAZOLE SODIUM 40 MG IV SOLR
40.0000 mg | Freq: Two times a day (BID) | INTRAVENOUS | Status: DC
  Administered 2018-10-27 – 2018-11-02 (×11): 40 mg via INTRAVENOUS
  Filled 2018-10-27 (×11): qty 40

## 2018-10-27 MED ORDER — SODIUM CHLORIDE 0.9 % IV SOLN
INTRAVENOUS | Status: DC
  Administered 2018-10-27 – 2018-10-30 (×5): via INTRAVENOUS

## 2018-10-27 MED ORDER — SERTRALINE HCL 25 MG PO TABS
25.0000 mg | ORAL_TABLET | Freq: Every day | ORAL | Status: DC
  Administered 2018-10-27 – 2018-11-02 (×7): 25 mg via ORAL
  Filled 2018-10-27 (×7): qty 1

## 2018-10-27 MED ORDER — PEG 3350-KCL-NA BICARB-NACL 420 G PO SOLR
4000.0000 mL | Freq: Once | ORAL | Status: AC
  Administered 2018-10-27: 18:00:00 4000 mL via ORAL
  Filled 2018-10-27: qty 4000

## 2018-10-27 MED ORDER — ONDANSETRON HCL 4 MG/2ML IJ SOLN
4.0000 mg | Freq: Once | INTRAMUSCULAR | Status: AC
  Administered 2018-10-27: 02:00:00 4 mg via INTRAVENOUS
  Filled 2018-10-27: qty 2

## 2018-10-27 MED ORDER — MORPHINE SULFATE (PF) 2 MG/ML IV SOLN
2.0000 mg | Freq: Once | INTRAVENOUS | Status: AC
  Administered 2018-10-27: 2 mg via INTRAVENOUS
  Filled 2018-10-27: qty 1

## 2018-10-27 MED ORDER — ACETAMINOPHEN 325 MG PO TABS
650.0000 mg | ORAL_TABLET | Freq: Four times a day (QID) | ORAL | Status: DC | PRN
  Filled 2018-10-27 (×2): qty 2

## 2018-10-27 MED ORDER — SODIUM CHLORIDE 0.9 % IV SOLN: 10.0000 mL/h | Freq: Once | INTRAVENOUS | Status: DC

## 2018-10-27 MED ORDER — ATORVASTATIN CALCIUM 40 MG PO TABS
40.0000 mg | ORAL_TABLET | Freq: Every day | ORAL | Status: DC
  Administered 2018-10-27 – 2018-11-02 (×6): 40 mg via ORAL
  Filled 2018-10-27 (×6): qty 1

## 2018-10-27 NOTE — Progress Notes (Signed)
Pt drinking movie prep for EGD and colonoscopy planned for 10/28/18. When RN attempted to encourage pt to drink more contrast pt asked what procedures is she having done. This RN explained to pt what she is having done and that possible reason is her previous GI history of polips and diverticulosis as well as recent anemia.  Pt very forgetful.. MD on call contacted and notified of situation. Pt daughter contacted and she has spoken with pt.  Pt agreed to drink go lightly at this time. Needs to be reminded frequently and explained why she is drinking go lightly.  Will continue to reinforce.

## 2018-10-27 NOTE — ED Provider Notes (Signed)
TIME SEEN: 1:29 AM  CHIEF COMPLAINT: Chest pain, shortness of breath  HPI: Patient is a 73 year old female with history of hypertension, hyperlipidemia who presents to the emergency department with chest pain and shortness of breath that woke her from sleep.  States her chest pain is a tightness across her anterior chest without radiation.  She was given aspirin 325 mg with EMS and 3 nitroglycerin with some improvement in symptoms.  No lower extremity swelling or pain.  States over the past week she has felt like "I have the flu".  Reports a low-grade fevers.  No cough, vomiting or diarrhea.  States she was seen by her primary care doctor but not tested for coronavirus.  PCP - Chambliss  ROS: See HPI Constitutional:  fever  Eyes: no drainage  ENT: no runny nose   Cardiovascular:   chest pain  Resp:  SOB  GI: no vomiting GU: no dysuria Integumentary: no rash  Allergy: no hives  Musculoskeletal: no leg swelling  Neurological: no slurred speech ROS otherwise negative  PAST MEDICAL HISTORY/PAST SURGICAL HISTORY:  Past Medical History:  Diagnosis Date  . Anemia   . Anxiety   . Arthritis    "hands" (05/09/2015)  . GERD (gastroesophageal reflux disease)   . High cholesterol   . History of blood transfusion 1960s; 05/08/2015   "when I had my kids; low HgB"  . Hypertension   . Osteoarthritis   . Pneumonia 05/2004    MEDICATIONS:  Prior to Admission medications   Medication Sig Start Date End Date Taking? Authorizing Provider  acetaminophen (TYLENOL) 325 MG tablet Take 650 mg by mouth every 6 (six) hours as needed for mild pain or headache.    [provider]  allopurinol (ZYLOPRIM) 100 MG tablet Take 1 tablet (100 mg total) by mouth daily. Patient not taking: Reported on 09/26/2018 07/11/18   [name], MD  ALPRAZolam Duanne Moron) 0.25 MG tablet TAKE 1 TABLET (0.25 MG TOTAL) BY MOUTH AT BEDTIME AS NEEDED. 10/14/18   [name], MD  aspirin 81 MG tablet Take 81  mg by mouth daily.    [provider]  atorvastatin (LIPITOR) 40 MG tablet Take 1 tablet (40 mg total) by mouth daily. 05/31/18   [name], MD  carvedilol (COREG) 12.5 MG tablet Take 1 tablet (12.5 mg total) by mouth 2 (two) times daily with a meal. 11/09/17   Chambliss, Jeb Levering, MD  colchicine 0.6 MG tablet Take 1 tablet (0.6 mg total) by mouth daily. Patient not taking: Reported on 06/22/2018 06/06/18   [name], MD  gabapentin (NEURONTIN) 100 MG capsule TAKE 1-3 CAPS AT NIGHT FOR FOOT PAIN AS NEEDED 07/28/18   [name], MD  lisinopril-hydrochlorothiazide (PRINZIDE,ZESTORETIC) 20-25 MG tablet Take 1 tablet by mouth daily. 11/09/17   Chambliss, Jeb Levering, MD  NORCO 5-325 MG tablet Take 1 tablet by mouth every 12 (twelve) hours as needed for moderate pain. Patient not taking: Reported on 09/26/2018 07/21/18   Loa Socks, DO  omeprazole (PRILOSEC) 20 MG capsule TAKE 1 CAPSULE BY MOUTH DAILY AS NEEDED 08/09/18   [name], MD  sertraline (ZOLOFT) 50 MG tablet TAKE 1/2 TABLET BY MOUTH DAILY 09/13/18   [name], MD  traMADol (ULTRAM) 50 MG tablet Take 2 tablets (100 mg total) by mouth every 8 (eight) hours as needed. Patient not taking: Reported on 06/08/2018 05/20/18   [name], MD    ALLERGIES:  No Known Allergies  SOCIAL HISTORY:  Social History   Tobacco Use  . Smoking status: Former Smoker    Packs/day: 0.30    Years: 8.00    Pack years: 2.40    Types: Cigarettes    Quit date: 05/30/2004    Years since quitting: 14.4  . Smokeless tobacco: Never Used  Substance Use Topics  . Alcohol use: No    Alcohol/week: 0.0 standard drinks    FAMILY HISTORY: Family History  Problem Relation Age of Onset  . Heart disease Mother        died at age 52  . Breast cancer Sister   . Cancer Sister        Breast  . Hypertension Father   . Hypertension Sister     EXAM: BP (!) 153/66 (BP Location: Left Arm)   Pulse  88   Temp 98.9 F (37.2 C) (Rectal)   Resp 20   SpO2 98%  CONSTITUTIONAL: Alert and oriented and responds appropriately to questions.  Elderly, no distress HEAD: Normocephalic EYES: Conjunctivae clear, pupils appear equal, EOMI ENT: normal nose; moist mucous membranes NECK: Supple, no meningismus, no nuchal rigidity, no LAD  CARD: RRR; S1 and S2 appreciated; no murmurs, no clicks, no rubs, no gallops RESP: Normal chest excursion without splinting or tachypnea; breath sounds clear and equal bilaterally; no wheezes, no rhonchi, no rales, no hypoxia or respiratory distress, speaking full sentences ABD/GI: Normal bowel sounds; non-distended; soft, non-tender, no rebound, no guarding, no peritoneal signs, no hepatosplenomegaly RECTAL:  Normal rectal tone, no gross blood or melena, guaiac negative, no hemorrhoids appreciated, nontender rectal exam, no fecal impaction BACK:  The back appears normal and is non-tender to palpation, there is no CVA tenderness EXT: Normal ROM in all joints; non-tender to palpation; no edema; normal capillary refill; no cyanosis, no calf tenderness or swelling    SKIN: Normal color for age and race; warm; no rash NEURO: Moves all extremities equally PSYCH: The patient's mood and manner are appropriate. Grooming and personal hygiene are appropriate.  MEDICAL DECISION MAKING: Patient here with chest pain and shortness of breath.  She has multiple risk factors for ACS.  Will give morphine, Zofran for symptomatic relief.  She did have some improvement with 3 nitroglycerin with EMS.  EKG here shows no ischemic abnormality.  Differential also includes PE, pneumonia, coronavirus.  Less likely dissection.  She reports low-grade fevers at home but is afebrile here.  Will check COVID-19 swab as well as urinalysis.  Anticipate admission.  ED PROGRESS: Patient's hemoglobin is 6.9.  Will transfuse 1 unit of packed red blood cells.  She denies vomiting blood, vaginal bleeding, rectal  bleeding or melena.  She has no gross blood on rectal exam.  Has never had a blood transfusion before.  Likely the cause of her symptoms today.  Chest pain has improved after morphine.  Will discuss with medicine for admission.  4:01 AM Discussed patient's case with FM resident.  I have recommended admission and patient (and family if present) agree with this plan. Admitting physician will place admission orders.   I reviewed all nursing notes, vitals, pertinent previous records, EKGs, lab and urine results, imaging (as available).     EKG Interpretation  Date/Time:  Thursday October 27 2018 01:10:17 EDT Ventricular Rate:  94 PR Interval:    QRS Duration: 88 QT Interval:  364 QTC Calculation: 456 R Axis:   74 Text Interpretation:  Sinus rhythm No significant change since last tracing Confirmed by , Cyril Mourning [phone])  on 10/27/2018 1:30:00 AM         CRITICAL CARE Performed by: Cyril Mourning    Total critical care time: 50 minutes  Critical care time was exclusive of separately billable procedures and treating other patients.  Critical care was necessary to treat or prevent imminent or life-threatening deterioration.  Critical care was time spent personally by me on the following activities: development of treatment plan with patient and/or surrogate as well as nursing, discussions with consultants, evaluation of patient's response to treatment, examination of patient, obtaining history from patient or surrogate, ordering and performing treatments and interventions, ordering and review of laboratory studies, ordering and review of radiographic studies, pulse oximetry and re-evaluation of patient's condition.    , Delice Bison, DO 10/27/18 [phone]

## 2018-10-27 NOTE — H&P (Addendum)
[facility] Admission History and Physical Service Pager: [phone]  [name] Medical record number: [id number] Date of birth: 1945/09/30 Age: 73 y.o. Gender: female  Primary Care Provider: [name], MD Consultants: Code Status: Full Preferred Emergency Contact: Hannah [name], daughter [phone]  Chief Complaint:   Assessment and Plan: Hannah [name] is a 73 y.o. female presenting with abdominal pain, vomiting and diarrheax 2 . PMH is significant for HTN, HLD,GERD, Arthritis,Anxiety, Anemia with previous blood transfusions  Symptomatic anemia, likely secondary to GI bleed Hemodynamically stable with low normal BP in the ED.  She states that she has been having some abdominal discomfort, with 1 episode of vomiting and 2 loose stools since yesterday.  She reports no hematemesis or bloody stool and states that thinks this may be attributed to something that she may have eaten as she has some family members that have had similar symptoms although is noted to have dementia so history accuracy is unclear.  Does endorse few week history of dizziness and headaches that she now thinks may be due to her anemia.  FOBT negative in the ED.  Denies vaginal bleeding or blood in urine.  Does have prior hospitalization of similar presentation in 2017, thought to be due to GI bleed at that time.  FOBT negative at that time as well.  Was referred to GI outpatient and received colonoscopy which showed 3 sigmoid polyps negative for malignancy, did show diverticulosis.  Anemia panel at that time consistent with iron deficiency anemia.  Anemia likely secondary to GI bleed given history of abdominal discomfort and lack of other evidence of bleeding.  Will consult GI for possible repeat colonoscopy given it is been 3 years since her last scope.  Patient denies family history of colon cancer.  In ED ordered 1 unit pRBC. -Admit to [place], attending Dr.  Ardelia Mems -Consult GI -Consider pelvic exam to rule out vaginal bleeding -Consider anemia panel -Follow-up post H&H -IV PPI -N.p.o. -A.m. CBC, CMP -SCDs  ?Chest pain Per ED report she was noted to have chest pain and tightness anteriorly with EMS without radiation and was given ASA 325mg  and 3 nitroglycerin with some improvement of symptoms.  Although on exam patient denies current or previous chest pain, cough, SOB, or chest tightness.  She is unsure of her medication as her daughter takes care of administrating her meds.  Her chest pain could be secondary to anemia given that her initial Hbg is 6.9. Chest xray notable for diffuse interstitial congestion and probable right pleural effusion though no evidence of volume overload on exam. BNP 253.3.  EKG unchanged from prior and without ST changes.  Troponin negative x2, doubt acute coronary event. Given that her SIRS criteria is low and negative COVID-19 status, I do not think she has a superimposed PNA.  Does have history of GERD and may be contributing especially given recent history of GI upset. -Monitor -Cardiac monitoring  HTN Low normal BP on admission.  Home meds: Coreg, Zestoretic - Will continue to monitor BP  -Continue Coreg -Hold lisinopril-HCTZ given low normal BP and current bleed  Gout Currently asymptomatic.  Per chart review, home meds include Colchicine and Allopurinol.  Per patient she does not take these. -Call daughter for updated med list -Monitor  GERD Currently asymptomatic.  On omeprazole at home. -IV PPI for active bleed  Anxiety On sertraline daily and as needed Xanax at home.  Does not appear anxious on exam. -Continue home meds.  FEN/GI: NPO, IV PPI Prophylaxis: SCD's  Disposition: Admit to med/tele, Attending Dr. Ardelia Mems  History of Present Illness:  Hannah [name] is a 73 y.o. female presenting with abdominal discomfort with vomit x1, loose stools x2 started around lunch today. Thought it was  something she ate but doesn't remember what she ate. Reports several people were sick with diarrhea and vomiting. Vomited up food contents. Does also report some dizziness off and on for 2-3 weeks. Denies any new, suspicious or raw foods. Ate a banana sandwich for lunch and coffee. Snacked throughout the day, maybe nabs and chips. Postmenopausal, no hysterectomy. Daughter manages her medications, she doesn't remember a lot of her medications. Doesn't remember taking any NSAIDs. Denies any missed doses of meds. Lives with daughter. Walks without assistance.   Review Of Systems: Per HPI with the following additions:   Review of Systems  Constitutional: Negative for fever.  Eyes: Negative for blurred vision and double vision.  Respiratory: Negative for shortness of breath.   Cardiovascular: Negative for chest pain and leg swelling.  Gastrointestinal: Positive for diarrhea and vomiting. Negative for abdominal pain, blood in stool, constipation and nausea.  Genitourinary: Negative for dysuria and hematuria.  Musculoskeletal: Negative for back pain.  Skin: Negative for rash.  Neurological: Positive for dizziness. Negative for loss of consciousness and headaches.    Patient Active Problem List   Diagnosis Date Noted  . Right foot pain 06/22/2018  . Inability to bear weight 06/22/2018  . Left foot pain 05/31/2018  . Elevated TSH 02/03/2018  . Cerebral microvascular disease 02/02/2018  . Renal insufficiency 11/09/2017  . Gout 09/11/2015  . OBESITY 03/26/2010  . Anxiety state 08/09/2008  . Essential hypertension 08/09/2008  . GERD 08/09/2008    Past Medical History: Past Medical History:  Diagnosis Date  . Anemia   . Anxiety   . Arthritis    "hands" (05/09/2015)  . GERD (gastroesophageal reflux disease)   . High cholesterol   . History of blood transfusion 1960s; 05/08/2015   "when I had my kids; low HgB"  . Hypertension   . Osteoarthritis   . Pneumonia 05/2004    Past Surgical  History: Past Surgical History:  Procedure Laterality Date  . CARDIAC CATHETERIZATION  05/2004  . COLONOSCOPY  07/2002   Archie Endo 09/02/2010  . ESOPHAGOGASTRODUODENOSCOPY  07/2002   w/biopsies/notes 09/02/2010    Social History: Social History   Tobacco Use  . Smoking status: Former Smoker    Packs/day: 0.30    Years: 8.00    Pack years: 2.40    Types: Cigarettes    Quit date: 05/30/2004    Years since quitting: 14.4  . Smokeless tobacco: Never Used  Substance Use Topics  . Alcohol use: No    Alcohol/week: 0.0 standard drinks  . Drug use: No   Additional social history:Lives with her daughter Please also refer to relevant sections of EMR.  Family History: Family History  Problem Relation Age of Onset  . Heart disease Mother        died at age 74  . Breast cancer Sister   . Cancer Sister        Breast  . Hypertension Father   . Hypertension Sister    (If not completed, MUST add something in)  Allergies and Medications: No Known Allergies No current facility-administered medications on file prior to encounter.    Current Outpatient Medications on File Prior to Encounter  Medication Sig Dispense Refill  . acetaminophen (TYLENOL) 325 MG  tablet Take 650 mg by mouth every 6 (six) hours as needed for mild pain or headache.    . allopurinol (ZYLOPRIM) 100 MG tablet Take 1 tablet (100 mg total) by mouth daily. (Patient not taking: Reported on 09/26/2018) 90 tablet 1  . ALPRAZolam (XANAX) 0.25 MG tablet TAKE 1 TABLET (0.25 MG TOTAL) BY MOUTH AT BEDTIME AS NEEDED. 30 tablet 0  . aspirin 81 MG tablet Take 81 mg by mouth daily.    Marland Kitchen atorvastatin (LIPITOR) 40 MG tablet Take 1 tablet (40 mg total) by mouth daily. 90 tablet 3  . carvedilol (COREG) 12.5 MG tablet Take 1 tablet (12.5 mg total) by mouth 2 (two) times daily with a meal. 180 tablet 3  . colchicine 0.6 MG tablet Take 1 tablet (0.6 mg total) by mouth daily. (Patient not taking: Reported on 06/22/2018) 90 tablet 1  . gabapentin  (NEURONTIN) 100 MG capsule TAKE 1-3 CAPS AT NIGHT FOR FOOT PAIN AS NEEDED 270 capsule 1  . lisinopril-hydrochlorothiazide (PRINZIDE,ZESTORETIC) 20-25 MG tablet Take 1 tablet by mouth daily. 90 tablet 3  . NORCO 5-325 MG tablet Take 1 tablet by mouth every 12 (twelve) hours as needed for moderate pain. (Patient not taking: Reported on 09/26/2018) 10 tablet 0  . omeprazole (PRILOSEC) 20 MG capsule TAKE 1 CAPSULE BY MOUTH DAILY AS NEEDED 180 capsule 0  . sertraline (ZOLOFT) 50 MG tablet TAKE 1/2 TABLET BY MOUTH DAILY 45 tablet 0  . traMADol (ULTRAM) 50 MG tablet Take 2 tablets (100 mg total) by mouth every 8 (eight) hours as needed. (Patient not taking: Reported on 06/08/2018) 30 tablet 0    Objective: BP (!) 116/50   Pulse 83   Temp 98.9 F (37.2 C) (Rectal)   Resp 17   SpO2 98%  Exam: General: Pleasant and cooperative, laying in bed in NAD Eyes:PERRL ENTM: mucous membranes moist, some dental caries noted Neck: supple, no lymphedema Cardiovascular: S1 S2 normal with grade 1systolic ejection murmur, regular rate Respiratory: no wheezing, no crackles noted, diminished breath sounds to bases bilaterally Gastrointestinal: soft, non tender, BS present in all quadrants MSK: good strength Derm: no rashes or abraision Neuro:A&O x3, needs some direction Psych: some memory loss  Labs and Imaging: CBC BMET  Recent Labs  Lab 10/27/18 0140  WBC 9.2  HGB 6.9*  HCT 24.4*  PLT 222   Recent Labs  Lab 10/27/18 0140  NA 140  K 4.6  CL 108  CO2 21*  BUN 31*  CREATININE 1.19*  GLUCOSE 124*  CALCIUM 8.2*     EKG: Unchanged from prior, no ST changes  Dg Chest Port 1 View  Result Date: 10/27/2018 CLINICAL DATA:  Initial evaluation for acute chest pain, shortness of breath. EXAM: PORTABLE CHEST 1 VIEW COMPARISON:  Prior radiograph from 06/14/2016 FINDINGS: Transverse heart size at the upper limits of normal. Mediastinal silhouette within normal limits. Aortic atherosclerosis. Lungs normally  inflated. Mild diffuse pulmonary interstitial congestion without frank alveolar edema. Probable small right pleural effusion. No consolidative opacity. No pneumothorax. No acute osseous finding. IMPRESSION: 1. Mild diffuse pulmonary interstitial congestion without frank pulmonary edema. 2. Probable small right pleural effusion. 3. Aortic atherosclerosis. Electronically Signed   By: Jeannine Boga M.D.   On: 10/27/2018 02:10    Carollee Leitz, MD 10/27/2018, 4:01 AM PGY-1, Start Intern pager: [phone], text pages welcome  FPTS Upper-Level Resident Addendum   I have independently interviewed and examined the patient. I have discussed the above with the  original Chief Strategy Officer and agree with their documentation. My edits for correction/addition/clarification are in green. Please see also any attending notes.    Rory Percy, DO PGY-3, [facility] 10/27/2018 8:22 AM  FPTS Service pager: [phone] (text pages welcome through [facility])

## 2018-10-27 NOTE — Consult Note (Addendum)
Reason for Consult: Severe anemia.  Referring Physician: FPTS.  Hannah [name] is an 73 y.o. female.  HPI: Hannah [name] is a 51 year white female, with multiple medical problems listed below, woke up this morning with chest pain and shortness of breath. She was given Aspirin and NTG by the EMS with some improvement in her symptoms. She denies having any abdominal pain, nausea, vomiting, melena or hematochezia. She is on Omeprazole for acid reflux She denies having any dysphagia or odynophagia. She was noted to have a hemoglobin of 6.9 gms/dl. EKG showed no evidence of ischemia. She was guaiac negative in the ER. Her last EGD/colonoscopy were done on 06/12/2015, done for iron deficiency anemia, when fundic gland polyps were noted in the stomach and sigmoid diverticulosis was noted on the colonoscopy and mucosal type polyps were removed. She denies a family history of colon cancer. Patient received on unit of PRBC's today.   Past Medical History:  Diagnosis Date  . Anemia   . Anxiety   . Arthritis    "hands" (05/09/2015)  . GERD (gastroesophageal reflux disease)   . High cholesterol   . History of blood transfusion 1960s; 05/08/2015   "when I had my kids; low HgB"  . Hypertension   . Osteoarthritis   . Pneumonia 05/2004   Past Surgical History:  Procedure Laterality Date  . CARDIAC CATHETERIZATION  05/2004  . COLONOSCOPY  07/2002   Archie Endo 09/02/2010  . ESOPHAGOGASTRODUODENOSCOPY  07/2002   w/biopsies/notes 09/02/2010   Family History  Problem Relation Age of Onset  . Heart disease Mother        died at age 35  . Breast cancer Sister   . Cancer Sister        Breast  . Hypertension Father   . Hypertension Sister    Social History:  reports that she quit smoking about 14 years ago. Her smoking use included cigarettes. She has a 2.40 pack-year smoking history. She has never used smokeless tobacco. She reports that she does not drink alcohol or use drugs.  Allergies: No Known  Allergies  Medications: I have reviewed the patient's current medications.  Results for orders placed or performed during the hospital encounter of 10/27/18 (from the past 48 hour(s))  CBC with Differential     Status: Abnormal   Collection Time: 10/27/18  1:40 AM  Result Value Ref Range   WBC 9.2 4.0 - 10.5 K/uL   RBC 2.65 (L) 3.87 - 5.11 MIL/uL   Hemoglobin 6.9 (LL) 12.0 - 15.0 g/dL    Comment: REPEATED TO VERIFY THIS CRITICAL RESULT HAS VERIFIED AND BEEN CALLED TO [facility] GRINDSTAFF RN BY [facility]INSON ON 07 09 2020 AT 0207, AND HAS BEEN READ BACK.     HCT 24.4 (L) 36.0 - 46.0 %   MCV 92.1 80.0 - 100.0 fL   MCH 26.0 26.0 - 34.0 pg   MCHC 28.3 (L) 30.0 - 36.0 g/dL   RDW 14.8 11.5 - 15.5 %   Platelets 222 150 - 400 K/uL   nRBC 0.0 0.0 - 0.2 %   Neutrophils Relative % 70 %   Neutro Abs 6.4 1.7 - 7.7 K/uL   Lymphocytes Relative 22 %   Lymphs Abs 2.0 0.7 - 4.0 K/uL   Monocytes Relative 8 %   Monocytes Absolute 0.7 0.1 - 1.0 K/uL   Eosinophils Relative 0 %   Eosinophils Absolute 0.0 0.0 - 0.5 K/uL   Basophils Relative 0 %   Basophils Absolute 0.0  0.0 - 0.1 K/uL   Immature Granulocytes 0 %   Abs Immature Granulocytes 0.03 0.00 - 0.07 K/uL    Comment: Performed at [facility], [place] [address] [place], [place] [address]  Basic metabolic panel     Status: Abnormal   Collection Time: 10/27/18  1:40 AM  Result Value Ref Range   Sodium 140 135 - 145 mmol/L   Potassium 4.6 3.5 - 5.1 mmol/L   Chloride 108 98 - 111 mmol/L   CO2 21 (L) 22 - 32 mmol/L   Glucose, Bld 124 (H) 70 - 99 mg/dL   BUN 31 (H) 8 - 23 mg/dL   Creatinine, Ser 1.19 (H) 0.44 - 1.00 mg/dL   Calcium 8.2 (L) 8.9 - 10.3 mg/dL   GFR calc non Af Amer 45 (L) >60 mL/min   GFR calc Af Amer 52 (L) >60 mL/min   Anion gap 11 5 - 15    Comment: Performed at [facility] [address] [place], [place] [address]  Troponin I (High Sensitivity)     Status: None   Collection Time: 10/27/18  1:40 AM  Result Value  Ref Range   Troponin I (High Sensitivity) 8 <18 ng/L    Comment: (NOTE) Elevated high sensitivity troponin I (hsTnI) values and significant  changes across serial measurements may suggest ACS but many other  chronic and acute conditions are known to elevate hsTnI results.  Refer to the "Links" section for chest pain algorithms and additional  guidance. Performed at [facility], [place] [address], [place], [place] [address]   SARS Coronavirus 2 (CEPHEID- Performed in [place] hospital lab), Hosp Order     Status: None   Collection Time: 10/27/18  1:45 AM   Specimen: Nasopharyngeal Swab  Result Value Ref Range   SARS Coronavirus 2 NEGATIVE NEGATIVE    Comment: (NOTE) If result is NEGATIVE SARS-CoV-2 target nucleic acids are NOT DETECTED. The SARS-CoV-2 RNA is generally detectable in upper and lower  respiratory specimens during the acute phase of infection. The lowest  concentration of SARS-CoV-2 viral copies this assay can detect is 250  copies / mL. A negative result does not preclude SARS-CoV-2 infection  and should not be used as the sole basis for treatment or other  patient management decisions.  A negative result may occur with  improper specimen collection / handling, submission of specimen other  than nasopharyngeal swab, presence of viral mutation(s) within the  areas targeted by this assay, and inadequate number of viral copies  (<250 copies / mL). A negative result must be combined with clinical  observations, patient history, and epidemiological information. If result is POSITIVE SARS-CoV-2 target nucleic acids are DETECTED. The SARS-CoV-2 RNA is generally detectable in upper and lower  respiratory specimens dur ing the acute phase of infection.  Positive  results are indicative of active infection with SARS-CoV-2.  Clinical  correlation with patient history and other diagnostic information is  necessary to determine patient infection status.  Positive results  do  not rule out bacterial infection or co-infection with other viruses. If result is PRESUMPTIVE POSTIVE SARS-CoV-2 nucleic acids MAY BE PRESENT.   A presumptive positive result was obtained on the submitted specimen  and confirmed on repeat testing.  While 2019 novel coronavirus  (SARS-CoV-2) nucleic acids may be present in the submitted sample  additional confirmatory testing may be necessary for epidemiological  and / or clinical management purposes  to differentiate between  SARS-CoV-2  and other Sarbecovirus currently known to infect humans.  If clinically indicated additional testing with an alternate test  methodology [phone]) is advised. The SARS-CoV-2 RNA is generally  detectable in upper and lower respiratory sp ecimens during the acute  phase of infection. The expected result is Negative. Fact Sheet for Patients:  StrictlyIdeas.no Fact Sheet for Healthcare Providers: BankingDealers.co.za This test is not yet approved or cleared by the [place] FDA and has been authorized for detection and/or diagnosis of SARS-CoV-2 by FDA under an Emergency Use Authorization (EUA).  This EUA will remain in effect (meaning this test can be used) for the duration of the COVID-19 declaration under Section 564(b)(1) of the Act, 21 U.S.C. section 360bbb-3(b)(1), unless the authorization is terminated or revoked sooner. Performed at [facility], [place] [address] [place], [place] [address]   Prepare RBC     Status: None   Collection Time: 10/27/18  2:33 AM  Result Value Ref Range   Order Confirmation      ORDER PROCESSED BY BLOOD BANK Performed at [facility], [place] [address], [place], [place] [address]   Troponin I (High Sensitivity)     Status: None   Collection Time: 10/27/18  2:37 AM  Result Value Ref Range   Troponin I (High Sensitivity) 9 <18 ng/L    Comment: (NOTE) Elevated high sensitivity troponin I (hsTnI) values  and significant  changes across serial measurements may suggest ACS but many other  chronic and acute conditions are known to elevate hsTnI results.  Refer to the "Links" section for chest pain algorithms and additional  guidance. Performed at [facility], [place] [address] [place], [place] [address]   Brain natriuretic peptide     Status: Abnormal   Collection Time: 10/27/18  2:37 AM  Result Value Ref Range   B Natriuretic Peptide 253.3 (H) 0.0 - 100.0 pg/mL    Comment: Performed at [facility] [address] [place], [place] [address]  Type and screen [place]     Status: None (Preliminary result)   Collection Time: 10/27/18  2:37 AM  Result Value Ref Range   ABO/RH(D) O NEG    Antibody Screen POS    Sample Expiration 10/30/2018,2359    Antibody Identification ANTI D    Unit Number [id number]    Blood Component Type RED CELLS,LR    Unit division 00    Status of Unit ISSUED    Transfusion Status OK TO TRANSFUSE    Crossmatch Result COMPATIBLE   Urinalysis, Routine w reflex microscopic     Status: Abnormal   Collection Time: 10/27/18  3:10 AM  Result Value Ref Range   Color, Urine YELLOW YELLOW   APPearance HAZY (A) CLEAR   Specific Gravity, Urine 1.020 1.005 - 1.030   pH 5.0 5.0 - 8.0   Glucose, UA NEGATIVE NEGATIVE mg/dL   Hgb urine dipstick NEGATIVE NEGATIVE   Bilirubin Urine NEGATIVE NEGATIVE   Ketones, ur NEGATIVE NEGATIVE mg/dL   Protein, ur NEGATIVE NEGATIVE mg/dL   Nitrite NEGATIVE NEGATIVE   Leukocytes,Ua LARGE (A) NEGATIVE   RBC / HPF 0-5 0 - 5 RBC/hpf   WBC, UA 11-20 0 - 5 WBC/hpf   Bacteria, UA RARE (A) NONE SEEN   Squamous Epithelial / LPF 6-10 0 - 5   Mucus PRESENT     Comment: Performed at [facility], [address] [place], [place] [address]  Protime-INR  Status: None   Collection Time: 10/27/18  3:30 AM  Result Value Ref Range   Prothrombin Time 15.0 11.4 - 15.2 seconds   INR 1.2 0.8 - 1.2    Comment:  (NOTE) INR goal varies based on device and disease states. Performed at [facility], [place] [address], [place], [place] [address]   POC occult blood, ED     Status: None   Collection Time: 10/27/18  3:34 AM  Result Value Ref Range   Fecal Occult Bld NEGATIVE NEGATIVE  Reticulocytes     Status: Abnormal   Collection Time: 10/27/18  9:47 AM  Result Value Ref Range   Retic Ct Pct 2.6 0.4 - 3.1 %   RBC. 2.99 (L) 3.87 - 5.11 MIL/uL   Retic Count, Absolute 77.7 19.0 - 186.0 K/uL   Immature Retic Fract 28.9 (H) 2.3 - 15.9 %    Comment: Performed at [facility] [address] [place], [place] [address]  Hemoglobin and hematocrit, blood     Status: Abnormal   Collection Time: 10/27/18  9:47 AM  Result Value Ref Range   Hemoglobin 7.9 (L) 12.0 - 15.0 g/dL   HCT 26.9 (L) 36.0 - 46.0 %    Comment: Performed at [facility], [place] [address], [place], [place] [address]   Dg Chest Port 1 View  Result Date: 10/27/2018 CLINICAL DATA:  Initial evaluation for acute chest pain, shortness of breath. EXAM: PORTABLE CHEST 1 VIEW COMPARISON:  Prior radiograph from 06/14/2016 FINDINGS: Transverse heart size at the upper limits of normal. Mediastinal silhouette within normal limits. Aortic atherosclerosis. Lungs normally inflated. Mild diffuse pulmonary interstitial congestion without frank alveolar edema. Probable small right pleural effusion. No consolidative opacity. No pneumothorax. No acute osseous finding. IMPRESSION: 1. Mild diffuse pulmonary interstitial congestion without frank pulmonary edema. 2. Probable small right pleural effusion. 3. Aortic atherosclerosis. Electronically Signed   By: Jeannine Boga M.D.   On: 10/27/2018 02:10   Review of Systems  Constitutional: Negative.   HENT: Negative.   Eyes: Negative.   Respiratory: Positive for shortness of breath.   Cardiovascular: Positive for chest pain.  Gastrointestinal: Positive for heartburn. Negative for abdominal pain,  blood in stool, constipation, diarrhea, melena, nausea and vomiting.  Genitourinary: Negative.   Musculoskeletal: Positive for joint pain.  Skin: Negative.   Neurological: Negative.   Endo/Heme/Allergies: Negative.   Psychiatric/Behavioral: Positive for memory loss. The patient is nervous/anxious.    Blood pressure 133/60, pulse 83, temperature 97.7 F (36.5 C), temperature source Oral, resp. rate 18, SpO2 95 %. Physical Exam  Constitutional: She is oriented to person, place, and time. She appears well-developed and well-nourished.  HENT:  Head: Normocephalic and atraumatic.  Eyes: Pupils are equal, round, and reactive to light. Conjunctivae and EOM are normal.  Neck: Normal range of motion. Neck supple.  Cardiovascular: Normal rate and regular rhythm.  Respiratory: Effort normal and breath sounds normal.  GI: Soft. Bowel sounds are normal.  Musculoskeletal: Normal range of motion.  Neurological: She is alert and oriented to person, place, and time.  Skin: Skin is warm and dry.  Psychiatric: She has a normal mood and affect. Her behavior is normal. Judgment and thought content normal.   Assessment/Plan: 1) Severe anemia-will plan to do an EGD/Colonosocpy tomorrow. 2) GERD-On PPI's at home. 3) Sigmoid diverticulosis. 4) Chest pain/HTN/Hyperlipidemia. 5) Anxiety disorder. 6) ?Memory loss.  7) Arthritis. Juanita Craver 10/27/2018, 11:34 AM

## 2018-10-27 NOTE — ED Triage Notes (Signed)
Coming by EMS after developing chest pain tonight at 2330. Had 1 episode previously the morning of the 8th but it went away so pt did not seek care until it came back tonight. Center tightness in chest. Shob complaint as well by pt. EMS gave 324mg  ASA and 3 nitro which relieved pt's pain from 7 to a 5. Memory loss/dementia at baseline. 94% on RA, 97.6 temp, 140/90, 84 HR

## 2018-10-27 NOTE — ED Notes (Signed)
Attempted report x1. 

## 2018-10-27 NOTE — ED Notes (Signed)
Pt's daughter stated that pt refuses to take iron pill at home that she has been prescribed in the past.

## 2018-10-27 NOTE — Progress Notes (Signed)
Pt admitted to 5W27 from ED. A&O to self, place & time. Able to ambulate in room. VS stable. Tele monitor placed. All questions/concerns addressed. Will continue to monitor.

## 2018-10-27 NOTE — ED Notes (Signed)
ED TO INPATIENT HANDOFF REPORT  ED Nurse Name and Phone #: [phone] Lucita Ferrara Name/Age/Gender Hannah [name] 73 y.o. female Room/Bed: 034C/034C  Code Status   Code Status: Prior  Home/SNF/Other Home Patient oriented to: self and situation Is this baseline? Yes   Triage Complete: Triage complete  Chief Complaint CP  Triage Note Coming by EMS after developing chest pain tonight at 2330. Had 1 episode previously the morning of the 8th but it went away so pt did not seek care until it came back tonight. Center tightness in chest. Shob complaint as well by pt. EMS gave 324mg  ASA and 3 nitro which relieved pt's pain from 7 to a 5. Memory loss/dementia at baseline. 94% on RA, 97.6 temp, 140/90, 84 HR   Allergies No Known Allergies  Level of Care/Admitting Diagnosis ED Disposition    ED Disposition Condition [facility] Area: [place] [[id number]]  Level of Care: Med-Surg [16]  Covid Evaluation: Confirmed COVID Negative  Diagnosis: Symptomatic anemia [[id number]]  Admitting Physician: Rory Percy [[id number]]  Attending Physician: Leeanne Rio [phone]  Estimated length of stay: past midnight tomorrow  Certification:: I certify this patient will need inpatient services for at least 2 midnights  PT Class (Do Not Modify): Inpatient [101]  PT Acc Code (Do Not Modify): Private [1]       B Medical/Surgery History Past Medical History:  Diagnosis Date  . Anemia   . Anxiety   . Arthritis    "hands" (05/09/2015)  . GERD (gastroesophageal reflux disease)   . High cholesterol   . History of blood transfusion 1960s; 05/08/2015   "when I had my kids; low HgB"  . Hypertension   . Osteoarthritis   . Pneumonia 05/2004   Past Surgical History:  Procedure Laterality Date  . CARDIAC CATHETERIZATION  05/2004  . COLONOSCOPY  07/2002   Archie Endo 09/02/2010  . ESOPHAGOGASTRODUODENOSCOPY  07/2002   w/biopsies/notes 09/02/2010     A IV  Location/Drains/Wounds Patient Lines/Drains/Airways Status   Active Line/Drains/Airways    Name:   Placement date:   Placement time:   Site:   Days:   Peripheral IV 10/27/18 Left;Posterior Hand   10/27/18    -    Hand   less than 1   Peripheral IV 10/27/18 Right Antecubital   10/27/18    0606    Antecubital   less than 1          Intake/Output Last 24 hours No intake or output data in the 24 hours ending 10/27/18 0620  Labs/Imaging Results for orders placed or performed during the hospital encounter of 10/27/18 (from the past 48 hour(s))  CBC with Differential     Status: Abnormal   Collection Time: 10/27/18  1:40 AM  Result Value Ref Range   WBC 9.2 4.0 - 10.5 K/uL   RBC 2.65 (L) 3.87 - 5.11 MIL/uL   Hemoglobin 6.9 (LL) 12.0 - 15.0 g/dL    Comment: REPEATED TO VERIFY THIS CRITICAL RESULT HAS VERIFIED AND BEEN CALLED TO [facility] Sadonna Kotara RN BY [facility] ON 07 09 2020 AT 0207, AND HAS BEEN READ BACK.     HCT 24.4 (L) 36.0 - 46.0 %   MCV 92.1 80.0 - 100.0 fL   MCH 26.0 26.0 - 34.0 pg   MCHC 28.3 (L) 30.0 - 36.0 g/dL   RDW 14.8 11.5 - 15.5 %   Platelets 222 150 - 400 K/uL   nRBC 0.0 0.0 -  0.2 %   Neutrophils Relative % 70 %   Neutro Abs 6.4 1.7 - 7.7 K/uL   Lymphocytes Relative 22 %   Lymphs Abs 2.0 0.7 - 4.0 K/uL   Monocytes Relative 8 %   Monocytes Absolute 0.7 0.1 - 1.0 K/uL   Eosinophils Relative 0 %   Eosinophils Absolute 0.0 0.0 - 0.5 K/uL   Basophils Relative 0 %   Basophils Absolute 0.0 0.0 - 0.1 K/uL   Immature Granulocytes 0 %   Abs Immature Granulocytes 0.03 0.00 - 0.07 K/uL    Comment: Performed at [facility] [address], [place], [place] [address]  Basic metabolic panel     Status: Abnormal   Collection Time: 10/27/18  1:40 AM  Result Value Ref Range   Sodium 140 135 - 145 mmol/L   Potassium 4.6 3.5 - 5.1 mmol/L   Chloride 108 98 - 111 mmol/L   CO2 21 (L) 22 - 32 mmol/L   Glucose, Bld 124 (H) 70 - 99 mg/dL   BUN 31 (H) 8 - 23 mg/dL    Creatinine, Ser 1.19 (H) 0.44 - 1.00 mg/dL   Calcium 8.2 (L) 8.9 - 10.3 mg/dL   GFR calc non Af Amer 45 (L) >60 mL/min   GFR calc Af Amer 52 (L) >60 mL/min   Anion gap 11 5 - 15    Comment: Performed at [facility] [address] [place], [place] [address]  Troponin I (High Sensitivity)     Status: None   Collection Time: 10/27/18  1:40 AM  Result Value Ref Range   Troponin I (High Sensitivity) 8 <18 ng/L    Comment: (NOTE) Elevated high sensitivity troponin I (hsTnI) values and significant  changes across serial measurements may suggest ACS but many other  chronic and acute conditions are known to elevate hsTnI results.  Refer to the "Links" section for chest pain algorithms and additional  guidance. Performed at [facility], [place] [address], [place], [place] [address]   SARS Coronavirus 2 (CEPHEID- Performed in [place] hospital lab), Hosp Order     Status: None   Collection Time: 10/27/18  1:45 AM   Specimen: Nasopharyngeal Swab  Result Value Ref Range   SARS Coronavirus 2 NEGATIVE NEGATIVE    Comment: (NOTE) If result is NEGATIVE SARS-CoV-2 target nucleic acids are NOT DETECTED. The SARS-CoV-2 RNA is generally detectable in upper and lower  respiratory specimens during the acute phase of infection. The lowest  concentration of SARS-CoV-2 viral copies this assay can detect is 250  copies / mL. A negative result does not preclude SARS-CoV-2 infection  and should not be used as the sole basis for treatment or other  patient management decisions.  A negative result may occur with  improper specimen collection / handling, submission of specimen other  than nasopharyngeal swab, presence of viral mutation(s) within the  areas targeted by this assay, and inadequate number of viral copies  (<250 copies / mL). A negative result must be combined with clinical  observations, patient history, and epidemiological information. If result is POSITIVE SARS-CoV-2 target  nucleic acids are DETECTED. The SARS-CoV-2 RNA is generally detectable in upper and lower  respiratory specimens dur ing the acute phase of infection.  Positive  results are indicative of active infection with SARS-CoV-2.  Clinical  correlation with patient history and other diagnostic information is  necessary to determine patient infection status.  Positive results do  not rule out  bacterial infection or co-infection with other viruses. If result is PRESUMPTIVE POSTIVE SARS-CoV-2 nucleic acids MAY BE PRESENT.   A presumptive positive result was obtained on the submitted specimen  and confirmed on repeat testing.  While 2019 novel coronavirus  (SARS-CoV-2) nucleic acids may be present in the submitted sample  additional confirmatory testing may be necessary for epidemiological  and / or clinical management purposes  to differentiate between  SARS-CoV-2 and other Sarbecovirus currently known to infect humans.  If clinically indicated additional testing with an alternate test  methodology [phone]) is advised. The SARS-CoV-2 RNA is generally  detectable in upper and lower respiratory sp ecimens during the acute  phase of infection. The expected result is Negative. Fact Sheet for Patients:  StrictlyIdeas.no Fact Sheet for Healthcare Providers: BankingDealers.co.za This test is not yet approved or cleared by the [place] FDA and has been authorized for detection and/or diagnosis of SARS-CoV-2 by FDA under an Emergency Use Authorization (EUA).  This EUA will remain in effect (meaning this test can be used) for the duration of the COVID-19 declaration under Section 564(b)(1) of the Act, 21 U.S.C. section 360bbb-3(b)(1), unless the authorization is terminated or revoked sooner. Performed at [facility], [place] [address] [place], [place] [address]   Prepare RBC     Status: None   Collection Time: 10/27/18  2:33 AM  Result Value  Ref Range   Order Confirmation      ORDER PROCESSED BY BLOOD BANK Performed at [facility], [place] [address] [place], [place] [address]   Troponin I (High Sensitivity)     Status: None   Collection Time: 10/27/18  2:37 AM  Result Value Ref Range   Troponin I (High Sensitivity) 9 <18 ng/L    Comment: (NOTE) Elevated high sensitivity troponin I (hsTnI) values and significant  changes across serial measurements may suggest ACS but many other  chronic and acute conditions are known to elevate hsTnI results.  Refer to the "Links" section for chest pain algorithms and additional  guidance. Performed at [facility], [place] [address], [place], [place] [address]   Brain natriuretic peptide     Status: Abnormal   Collection Time: 10/27/18  2:37 AM  Result Value Ref Range   B Natriuretic Peptide 253.3 (H) 0.0 - 100.0 pg/mL    Comment: Performed at [facility] [address], [place], [place] [address]  Type and screen [place]     Status: None (Preliminary result)   Collection Time: 10/27/18  2:37 AM  Result Value Ref Range   ABO/RH(D) O NEG    Antibody Screen POS    Sample Expiration 10/30/2018,2359    Antibody Identification ANTI D    Unit Number [id number]    Blood Component Type RED CELLS,LR    Unit division 00    Status of Unit ISSUED    Transfusion Status OK TO TRANSFUSE    Crossmatch Result COMPATIBLE   Urinalysis, Routine w reflex microscopic     Status: Abnormal   Collection Time: 10/27/18  3:10 AM  Result Value Ref Range   Color, Urine YELLOW YELLOW   APPearance HAZY (A) CLEAR   Specific Gravity, Urine 1.020 1.005 - 1.030   pH 5.0 5.0 - 8.0   Glucose, UA NEGATIVE NEGATIVE mg/dL   Hgb urine dipstick NEGATIVE NEGATIVE   Bilirubin Urine NEGATIVE NEGATIVE   Ketones, ur NEGATIVE NEGATIVE mg/dL   Protein, ur NEGATIVE NEGATIVE mg/dL  Nitrite NEGATIVE NEGATIVE   Leukocytes,Ua LARGE (A) NEGATIVE   RBC / HPF 0-5 0 - 5 RBC/hpf   WBC,  UA 11-20 0 - 5 WBC/hpf   Bacteria, UA RARE (A) NONE SEEN   Squamous Epithelial / LPF 6-10 0 - 5   Mucus PRESENT     Comment: Performed at [facility], [place] [address] [place], [place] [address]  Protime-INR     Status: None   Collection Time: 10/27/18  3:30 AM  Result Value Ref Range   Prothrombin Time 15.0 11.4 - 15.2 seconds   INR 1.2 0.8 - 1.2    Comment: (NOTE) INR goal varies based on device and disease states. Performed at [facility], [place] [address], [place], [place] [address]   POC occult blood, ED     Status: None   Collection Time: 10/27/18  3:34 AM  Result Value Ref Range   Fecal Occult Bld NEGATIVE NEGATIVE   Dg Chest Port 1 View  Result Date: 10/27/2018 CLINICAL DATA:  Initial evaluation for acute chest pain, shortness of breath. EXAM: PORTABLE CHEST 1 VIEW COMPARISON:  Prior radiograph from 06/14/2016 FINDINGS: Transverse heart size at the upper limits of normal. Mediastinal silhouette within normal limits. Aortic atherosclerosis. Lungs normally inflated. Mild diffuse pulmonary interstitial congestion without frank alveolar edema. Probable small right pleural effusion. No consolidative opacity. No pneumothorax. No acute osseous finding. IMPRESSION: 1. Mild diffuse pulmonary interstitial congestion without frank pulmonary edema. 2. Probable small right pleural effusion. 3. Aortic atherosclerosis. Electronically Signed   By: Jeannine Boga M.D.   On: 10/27/2018 02:10    Pending Labs [employer] (From admission, onward)    Start     Ordered   Signed and Held  Comprehensive metabolic panel  Tomorrow morning,   R     Signed and Held   Signed and Held  CBC with Differential/Platelet  Tomorrow morning,   R     Signed and Held          Vitals/Pain Today's Vitals   10/27/18 0530 10/27/18 0545 10/27/18 0607 10/27/18 0615  BP: (!) 129/52 (!) 129/52 (!) 119/52   Pulse: 87 87 80 86  Resp: 15  20 20   Temp: 98.6 F (37 C)  98.6 F (37 C)   TempSrc:  Oral  Oral   SpO2: 93% 98% 96% 100%  PainSc:        Isolation Precautions Airborne and Contact precautions  Medications Medications  0.9 %  sodium chloride infusion (has no administration in time range)  morphine 2 MG/ML injection 2 mg (2 mg Intravenous Given 10/27/18 0228)  ondansetron (ZOFRAN) injection 4 mg (4 mg Intravenous Given 10/27/18 0228)    Mobility walks with person assist Low fall risk   Focused Assessments Cardiac Assessment Handoff:    No results found for: CKTOTAL, CKMB, CKMBINDEX, TROPONINI No results found for: DDIMER Does the Patient currently have chest pain? No      R Recommendations: See Admitting Provider Note  Report given to:   Additional Notes: Getting 1 unit of blood

## 2018-10-27 NOTE — ED Notes (Signed)
Gave update to pt's daughter [name]. Informed her of pt's plan of care

## 2018-10-27 NOTE — Discharge Summary (Signed)
[facility] Discharge Summary  [name] Medical record number: [id number] Date of birth: 05/17/1945 Age: 73 y.o. Gender: female Date of Admission: 10/27/2018  Date of Discharge:  Admitting Physician: Leeanne Rio, MD  Primary Care Provider: [name], MD Consultants: GI  Indication for Hospitalization: Symptomatic anemia   Discharge Diagnoses/Problem List:  Symptomatic anemia likely to secondary to GI bleed Chronic HTN Gout GERD Anxiety  Disposition: home, lives with daughter  Discharge Condition: resolution of symptomatic anemia   Discharge Exam:    General: Looks well,cooperative and appears to be in no acute distress HEENT: Neck non-tender without lymphadenopathy, masses or thyromegaly Cardio: Normal S1 and S2, no S3 or S4. Rhythm is regular. No murmurs or rubs.   Pulm: Clear to auscultation bilaterally, no crackles, wheezing, or diminished breath sounds. Normal respiratory effort Abdomen: Bowel sounds normal. Abdomen soft and non-tender.  Extremities: No peripheral edema. Warm/ well perfused.  Strong radial pulse. Neuro: Cranial nerves grossly intact  Brief Hospital Course:  Hannah [name] was admitted on 12/26/18 initially presented to the ED with chest pain and dyspnea. She was treated for this with aspirin and nitrates. These symptoms resolved and upon questioning her she reported a few day history of abdominal pain with diarrhea and vomiting. No hematemesis, rectal bleeding or melena. Her Hb was 6.9 and she was transfused 1 unit RBC. Her repeat Hb post transfusion was 7.9. She had a GI consult and an endoscopy and colonoscopy on 01/25/19 which showed a single 7 mm sessile polypoid lesion with bleeding was found in the duodenal bulb with fresh blood oozing from the center. A single angioectasia without bleeding was found in the duodenal bulb. Colonoscopy showed diverticulosis in the sigmoid colon. A 10 mm  polypoid lesion was found in the proximal sigmoid colon-biopsies done. Two 6-7 mm sessile polyps were found in the proximal ascending colon; polyps were removed. Biopsies were pending on these results. 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 IR was consulted for the focal ectasia of the distal trifurcation of the GDA with bleeding intraluminal polyp on endoscopy. She had a successful mesenteric arteriogram and percutaneous coil embolization. There were no immediate complications. However patient was extremely anxious after procedure. A rapid response was called as her BP >672 systolic, HR 094 and desaturating to 89% on room air. This was found to be secondary to acute pulmonary edema secondary to fluid administration and also anxiety. Echo was ordered which showed left ejection fraction of 60-65%. Hannah [name] was found to be hypoxic in hospital for at least 2 days after the embolization procedure requiring 2-4L oxygen. CXR showed pulmonary edema. She was given IV furosemide 40mg  for 2 days. Her pulmonary edema improved significantly with this and she was saturating on room air quickly after. She is not being discharged on Furosemide. Hb on discharge is 7.5.  Issues for Follow Up:  1. We have stopped lisinopril-hydralazine in hospital as patient was normotensive and started her on lisinopril only. Please kindly monitor the BP and restart if neccessary.  2. GI follow up in 2 weeks with Dr Collene Mares 3. Please monitor pt's K and Magnesium on discharge as both were low on discharge. K on discharge is 2.9. We have given PO K 18meq to replete today. Mg on discharge is 1.1. We have given 2g IV Magnesium to replete.   Significant Procedures:  1. EGD-results as above 2. Colonoscopy-results  as above  3. Mesenteric arteriogram and percutaneous coil embolization   Significant Labs and Imaging:  Recent Labs  Lab  10/31/18 0246 11/01/18 0117 11/02/18 0312  WBC 8.5 9.4 8.0  HGB 7.3* 7.3* 7.5*  HCT 24.8* 24.7* 24.9*  PLT 207 221 231   Recent Labs  Lab 10/28/18 0211  10/30/18 0339 10/30/18 1544 10/31/18 0246 11/01/18 0117 11/02/18 0312  NA 139   < > 139 140 139 138 138  K 4.2   < > 3.6 3.8 3.6 3.5 2.9*  CL 104   < > 107 109 109 103 99  CO2 25   < > 23 21* 23 25 26   GLUCOSE 108*   < > 115* 146* 92 126* 139*  BUN 24*   < > 11 11 12 18 23   CREATININE 1.11*   < > 1.02* 1.02* 0.92 1.25* 1.26*  CALCIUM 8.7*   < > 8.6* 8.2* 8.1* 8.3* 7.8*  MG  --   --   --   --   --   --  1.1*  ALKPHOS 97  --   --  81  --   --   --   AST 13*  --   --  14*  --   --   --   ALT 11  --   --  11  --   --   --   ALBUMIN 3.3*  --   --  2.9*  --   --   --    < > = values in this interval not displayed.      Results/Tests Pending at Time of Discharge: Biopsy of polyps from EGD and colonoscopy  Discharge Medications:  Allergies as of 11/02/2018   No Known Allergies     Medication List    STOP taking these medications   allopurinol 100 MG tablet Commonly known as: ZYLOPRIM   aspirin 81 MG tablet   colchicine 0.6 MG tablet   gabapentin 100 MG capsule Commonly known as: NEURONTIN   lisinopril-hydrochlorothiazide 20-25 MG tablet Commonly known as: ZESTORETIC   Norco 5-325 MG tablet Generic drug: HYDROcodone-acetaminophen   traMADol 50 MG tablet Commonly known as: ULTRAM     TAKE these medications   acetaminophen 325 MG tablet Commonly known as: TYLENOL Take 650 mg by mouth every 6 (six) hours as needed for mild pain or headache.   ALPRAZolam 0.25 MG tablet Commonly known as: XANAX TAKE 1 TABLET (0.25 MG TOTAL) BY MOUTH AT BEDTIME AS NEEDED.   atorvastatin 40 MG tablet Commonly known as: LIPITOR Take 1 tablet (40 mg total) by mouth daily.   carvedilol 12.5 MG tablet Commonly known as: COREG Take 1 tablet (12.5 mg total) by mouth 2 (two) times daily with a meal.   lisinopril 2.5 MG  tablet Commonly known as: ZESTRIL Take 1 tablet (2.5 mg total) by mouth daily.   omeprazole 20 MG capsule Commonly known as: PRILOSEC TAKE 1 CAPSULE BY MOUTH DAILY AS NEEDED What changed:   how much to take  how to take this  when to take this  reasons to take this  additional instructions   sertraline 50 MG tablet Commonly known as: ZOLOFT TAKE 1/2 TABLET BY MOUTH DAILY       Discharge Instructions: Please refer to Patient Instructions section of EMR for full details.  Patient was counseled important signs and symptoms that should prompt return to medical care, changes in medications, dietary instructions, activity restrictions, and follow up appointments.  Follow-Up Appointments: 1. GI-FU in 2 weeks with Dr Collene Mares  Thank you for your on going care,  Lattie Haw, MD 11/02/2018, 2:16 PM PGY-1, [place]

## 2018-10-27 NOTE — ED Notes (Signed)
Awaiting blood transfusion due to antibody crossmatch. Spoke with blood bank. Stated they will call when unit is ready

## 2018-10-28 ENCOUNTER — Encounter (HOSPITAL_COMMUNITY)
Admission: EM | Disposition: A | Discharge status: Home / Self Care | Payer: Self-pay | Source: Home / Self Care | Attending: Family Medicine

## 2018-10-28 ENCOUNTER — Inpatient Hospital Stay (HOSPITAL_COMMUNITY): Payer: Medicare Other | Admitting: Certified Registered Nurse Anesthetist

## 2018-10-28 ENCOUNTER — Encounter (HOSPITAL_COMMUNITY): Payer: Self-pay | Admitting: Certified Registered Nurse Anesthetist

## 2018-10-28 HISTORY — PX: BIOPSY: SHX5522

## 2018-10-28 HISTORY — PX: COLONOSCOPY WITH PROPOFOL: SHX5780

## 2018-10-28 HISTORY — PX: HOT HEMOSTASIS: SHX5433

## 2018-10-28 HISTORY — PX: POLYPECTOMY: SHX5525

## 2018-10-28 HISTORY — PX: ESOPHAGOGASTRODUODENOSCOPY (EGD) WITH PROPOFOL: SHX5813

## 2018-10-28 LAB — COMPREHENSIVE METABOLIC PANEL
ALT: 11 U/L (ref 0–44)
AST: 13 U/L — ABNORMAL LOW (ref 15–41)
Albumin: 3.3 g/dL — ABNORMAL LOW (ref 3.5–5.0)
Alkaline Phosphatase: 97 U/L (ref 38–126)
Anion gap: 10 (ref 5–15)
BUN: 24 mg/dL — ABNORMAL HIGH (ref 8–23)
CO2: 25 mmol/L (ref 22–32)
Calcium: 8.7 mg/dL — ABNORMAL LOW (ref 8.9–10.3)
Chloride: 104 mmol/L (ref 98–111)
Creatinine, Ser: 1.11 mg/dL — ABNORMAL HIGH (ref 0.44–1.00)
GFR calc Af Amer: 56 mL/min — ABNORMAL LOW (ref >60)
GFR calc non Af Amer: 49 mL/min — ABNORMAL LOW (ref >60)
Glucose, Bld: 108 mg/dL — ABNORMAL HIGH (ref 70–99)
Potassium: 4.2 mmol/L (ref 3.5–5.1)
Sodium: 139 mmol/L (ref 135–145)
Total Bilirubin: 1.3 mg/dL — ABNORMAL HIGH (ref 0.3–1.2)
Total Protein: 6.4 g/dL — ABNORMAL LOW (ref 6.5–8.1)

## 2018-10-28 LAB — CBC WITH DIFFERENTIAL/PLATELET
Abs Immature Granulocytes: 0.03 10*3/uL (ref 0.00–0.07)
Basophils Absolute: 0 10*3/uL (ref 0.0–0.1)
Basophils Relative: 0 %
Eosinophils Absolute: 0 10*3/uL (ref 0.0–0.5)
Eosinophils Relative: 0 %
HCT: 27.3 % — ABNORMAL LOW (ref 36.0–46.0)
Hemoglobin: 8.1 g/dL — ABNORMAL LOW (ref 12.0–15.0)
Immature Granulocytes: 0 %
Lymphocytes Relative: 19 %
Lymphs Abs: 1.4 10*3/uL (ref 0.7–4.0)
MCH: 26.4 pg (ref 26.0–34.0)
MCHC: 29.7 g/dL — ABNORMAL LOW (ref 30.0–36.0)
MCV: 88.9 fL (ref 80.0–100.0)
Monocytes Absolute: 0.7 10*3/uL (ref 0.1–1.0)
Monocytes Relative: 9 %
Neutro Abs: 5.4 10*3/uL (ref 1.7–7.7)
Neutrophils Relative %: 72 %
Platelets: 241 10*3/uL (ref 150–400)
RBC: 3.07 MIL/uL — ABNORMAL LOW (ref 3.87–5.11)
RDW: 14.6 % (ref 11.5–15.5)
WBC: 7.6 10*3/uL (ref 4.0–10.5)
nRBC: 0 % (ref 0.0–0.2)

## 2018-10-28 LAB — TYPE AND SCREEN
ABO/RH(D): O NEG
Antibody Screen: POSITIVE
Unit division: 0

## 2018-10-28 LAB — BPAM RBC
Blood Product Expiration Date: 202007142359
ISSUE DATE / TIME: 202007090551
Unit Type and Rh: 9500

## 2018-10-28 SURGERY — ESOPHAGOGASTRODUODENOSCOPY (EGD) WITH PROPOFOL
Anesthesia: Monitor Anesthesia Care

## 2018-10-28 MED ORDER — PROPOFOL 10 MG/ML IV BOLUS
INTRAVENOUS | Status: DC | PRN
  Administered 2018-10-28: 20 mg via INTRAVENOUS

## 2018-10-28 MED ORDER — LACTATED RINGERS IV SOLN
INTRAVENOUS | Status: AC | PRN
  Administered 2018-10-28: 1000 mL via INTRAVENOUS

## 2018-10-28 MED ORDER — CARVEDILOL 12.5 MG PO TABS
12.5000 mg | ORAL_TABLET | Freq: Two times a day (BID) | ORAL | Status: DC
  Administered 2018-10-28 – 2018-11-02 (×8): 12.5 mg via ORAL
  Filled 2018-10-28 (×7): qty 1

## 2018-10-28 MED ORDER — ALPRAZOLAM 0.25 MG PO TABS
0.2500 mg | ORAL_TABLET | Freq: Every day | ORAL | Status: DC | PRN
  Administered 2018-10-29 – 2018-10-30 (×3): 0.25 mg via ORAL
  Filled 2018-10-28 (×3): qty 1

## 2018-10-28 MED ORDER — ALPRAZOLAM 0.5 MG PO TABS
0.5000 mg | ORAL_TABLET | Freq: Every day | ORAL | Status: DC | PRN
  Administered 2018-10-28: 0.5 mg via ORAL
  Filled 2018-10-28: qty 1

## 2018-10-28 MED ORDER — PROPOFOL 500 MG/50ML IV EMUL
INTRAVENOUS | Status: DC | PRN
  Administered 2018-10-28: 100 ug/kg/min via INTRAVENOUS

## 2018-10-28 MED ORDER — LACTATED RINGERS IV SOLN
INTRAVENOUS | Status: DC | PRN
  Administered 2018-10-28: 14:00:00 via INTRAVENOUS

## 2018-10-28 MED ORDER — ONDANSETRON HCL 4 MG/2ML IJ SOLN
INTRAMUSCULAR | Status: DC | PRN
  Administered 2018-10-28: 4 mg via INTRAVENOUS

## 2018-10-28 SURGICAL SUPPLY — 24 items

## 2018-10-28 NOTE — Op Note (Signed)
[facility] [name] Procedure Date : 10/28/2018 MRN: [id number] Attending MD: Juanita Craver , MD Date of Birth: 1945-12-01 CSN: [id number] Age: 73 Admit Type: Inpatient Procedure:                Colonoscopy Indications:              Iron deficiency anemia Providers:                Juanita Craver, MD, Glori Bickers, RN, William Dalton,                            Technician Referring MD:             Talbert Cage MD Medicines:                Monitored Anesthesia Care Complications:            No immediate complications. Estimated Blood Loss:     Estimated blood loss was minimal. Procedure:                Pre-Anesthesia Assessment: - Prior to the                            procedure, a history and physical was performed,                            and patient medications and allergies were                            reviewed. The patient's tolerance of previous                            anesthesia was also reviewed. The risks and                            benefits of the procedure and the sedation options                            and risks were discussed with the patient. All                            questions were answered, and informed consent was                            obtained. Prior Anticoagulants: The patient has                            taken no previous anticoagulant or antiplatelet                            agents except for aspirin. ASA Grade Assessment: II                            - A patient with mild systemic disease. After  reviewing the risks and benefits, the patient was                            deemed in satisfactory condition to undergo the                            procedure. After reviewing the risks and benefits,                            the patient was deemed in satisfactory condition to                            undergo the procedure. After obtaining informed      consent, the colonoscope was passed under direct                            vision. Throughout the procedure, the patient's                            blood pressure, pulse, and oxygen saturations were                            monitored continuously. The CF-HQ190L ([id number]                            Olympus colonoscope was introduced through the anus                            and advanced to the the cecum, identified by                            appendiceal orifice and ileocecal valve. The                            colonoscopy was performed with moderate difficulty                            due to inadequate bowel prep. Successful completion                            of the procedure was aided by lavage. The patient                            tolerated the procedure well. The quality of the                            bowel preparation was fair. The ileocecal valve,                            the appendiceal orifice and the rectum were                            photographed. Scope In: 2:09:29 PM Scope Out: 2:26:30 PM Scope  Withdrawal Time: 0 hours 10 minutes 57 seconds  Total Procedure Duration: 0 hours 17 minutes 1 second  Findings:      Many small and large-mouthed diverticula were found in the sigmoid colon.      A 10 mm polypoid lesion was found in the proximal sigmoid colon-biopsies       done.      Two 6-7 mm sessile polyps were found in the proximal ascending colon;       these polyps were removed with a hot snare x 2-200/20; resection and       retrieval were complete.      No additional abnormalities were found on retroflexion.      The prep was fair at best-polyps could be missed inspite of aggressive       lavage-patient did not finish her prep. Impression:               - Preparation of the colon was fair-polyps could be                            missed.                           - Diverticulosis in the sigmoid colon.                           - Benign appearing  polypoid lesion in the proximal                            sigmoid colon at 50 cm-biopsied.                           - Two 6-7 mm sessile polyps in the proximal                            ascending colon, removed with a hot snare x 2;                            resected and retrieved. Moderate Sedation:      MAC used. Recommendation:           - Clear liquid diet today.                           - Continue present medications.                           - Await pathology results. Procedure Code(s):        --- Professional ---                           [phone], Colonoscopy, flexible; with removal of                            tumor(s), polyp(s), or other lesion(s) by snare                            technique Diagnosis Code(s):        --- Professional ---  D50.9, Iron deficiency anemia, unspecified                           Z12.11, Encounter for screening for malignant                            neoplasm of colon                           D12.5, Benign neoplasm of sigmoid colon                           K57.30, Diverticulosis of large intestine without                            perforation or abscess without bleeding CPT copyright 2019 American Medical Association. All rights reserved. The codes documented in this report are preliminary and upon coder review may  be revised to meet current compliance requirements. Juanita Craver, MD Juanita Craver, MD 10/28/2018 2:53:44 PM This report has been signed electronically. Number of Addenda: 0

## 2018-10-28 NOTE — Progress Notes (Signed)
MD on call aware of BP. Pt asymptomatic. Will continue to monitor.

## 2018-10-28 NOTE — Progress Notes (Signed)
Family Medicine Teaching Service Daily Progress Note Intern Pager: [phone]  [name] Medical record number: [id number] Date of birth: 04/22/1945 Age: 73 y.o. Gender: female  Primary Care Provider: [name], MD Consultants: GI  Code Status: Full   Pt Overview and Major Events to Date:  Hannah [name] is a 73 y.o. female presenting with abdominal pain, vomiting and diarrheax 2 . Found to have Hb 6.9 on admission. PMH is significant for HTN, HLD,GERD, Arthritis,Anxiety, Anemia with previous blood transfusions  Assessment and Plan:  Symptomatic anemia, likely secondary to GI bleed Had short history of abdominal pain, diarrhea and vomiting prior to admission  Hb on admission 6.9, following 1 unit Rbc-Hb 7.9, today Hb 8.1 FOBT negative  Last colonoscopy in 2014- showed 3 sigmoid polyps and diverticulosis, had history of iron deficiency anema -Consult GI-appreciate recs: EGD and colonoscopy today -Monitor for active hematemesis or melena -Continue to monitor Hb-CBC daily -BMP daily -Anemia panel: iron studies, vit b12, folate  --NPO -IV PPI -SCDs -If EGD and colonoscopy consider other causes of bleed ie gynecological malignancy   HTN  Low normal Bp on admission -120/5. home meds: carvedilol and lisinopril-HCZT BP today 164/66   -continue carvdeilol -currently holding lisinopril-HCTZ-restart after procedure if BP tolerates -continue to monitor BP  Gout Currently asymptomatic.  Per chart review, home meds include Colchicine and Allopurinol.  Per patient she does not take these. -Call daughter for updated med list -Monitor  GERD Currently asymptomatic. On omeprazole at home. -IV PPI for active bleed  Anxiety On sertraline daily and as needed Xanax at home -Continue home meds.  FEN/GI: NPO, IV PPI  PPx: SCDs  Disposition: awaiting endoscopy and colonoscopy today. Awaiting to find source of anemia.  Subjective:  Pt looks well. Sat up  in bed watching tv. Reports diarrhea overnight secondary to laxatives for colonoscopy. Feels anxious prior to her procedure.    Objective: Temp:  [97.7 F (36.5 C)-98.2 F (36.8 C)] 97.7 F (36.5 C) (07/10 0056) Pulse Rate:  [80-97] 97 (07/10 0056) Resp:  [14-20] 14 (07/10 0056) BP: (120-168)/(50-68) 164/66 (07/10 0056) SpO2:  [88 %-100 %] 94 % (07/10 0114) FiO2 (%):  [3 %] 3 % (07/09 0745)   Physical Exam: General: looks well, anxious  Cardiovascular: s1 and s2, no added sounds, no rubs or gallops Respiratory: lung fields clear bilaterally, normal respiratory effort Abdomen: abdo soft non tender, non distended, no organomegaly Extremities: no pedal edema   Laboratory: Recent Labs  Lab 10/27/18 0140 10/27/18 0947 10/28/18 0211  WBC 9.2  --  7.6  HGB 6.9* 7.9* 8.1*  HCT 24.4* 26.9* 27.3*  PLT 222  --  241   Recent Labs  Lab 10/27/18 0140 10/28/18 0211  NA 140 139  K 4.6 4.2  CL 108 104  CO2 21* 25  BUN 31* 24*  CREATININE 1.19* 1.11*  CALCIUM 8.2* 8.7*  PROT  --  6.4*  BILITOT  --  1.3*  ALKPHOS  --  97  ALT  --  11  AST  --  13*  GLUCOSE 124* 108*      Imaging/Diagnostic Tests: Dg Chest Port 1 View  Result Date: 10/27/2018 CLINICAL DATA:  Initial evaluation for acute chest pain, shortness of breath. EXAM: PORTABLE CHEST 1 VIEW COMPARISON:  Prior radiograph from 06/14/2016 FINDINGS: Transverse heart size at the upper limits of normal. Mediastinal silhouette within normal limits. Aortic atherosclerosis. Lungs normally inflated. Mild diffuse pulmonary interstitial congestion without frank alveolar edema.  Probable small right pleural effusion. No consolidative opacity. No pneumothorax. No acute osseous finding. IMPRESSION: 1. Mild diffuse pulmonary interstitial congestion without frank pulmonary edema. 2. Probable small right pleural effusion. 3. Aortic atherosclerosis. Electronically Signed   By: Jeannine Boga M.D.   On: 10/27/2018 02:10    Hannah Haw,  MD 10/28/2018, 6:19 AM PGY-1, [place] Intern pager: [phone], text pages welcome

## 2018-10-28 NOTE — Progress Notes (Signed)
[facility] PCP Note  Feeling ok this am but unsure why she is here.  No chest pain or shortness of breath.    Up walking to bathroom Hgb stable  Presumed GI Bleed - for endos today. If hgb stable probable discharge tomorrow on Fe  Appreciate the great care of FMTS team

## 2018-10-28 NOTE — Progress Notes (Addendum)
FMTS Attending Daily Note:  Chrisandra Netters MD Personal pager:  [phone] FPTS Service Pager:  [phone]  I have seen and examined this patient and have reviewed their chart. I have discussed this patient with the resident. I agree with the resident's findings, assessment and care plan.  Additionally:  Well appearing. Pleasant and still confused. EGD results noted. Hemoglobin stable.  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 Await GI recommendations regarding this finding.  If no further GI procedures or interventions planned patient could likely go home today.  Leeanne Rio, MD  10/29/18    Family Medicine Teaching Service Daily Progress Note Intern Pager: [phone]  [name] Medical record number: [id number] Date of birth: 10-03-1945 Age: 73 y.o. Gender: female  Primary Care Provider: [name], MD Consultants: GI  Code Status: Full   Pt Overview and Major Events to Date:  MAHDIYA MOSSBERG is a 73 y.o. female presenting with abdominal pain, vomiting and diarrheax 2 . Found to have Hb 6.9 on admission. PMH is significant for HTN, HLD,GERD, Arthritis,Anxiety, Anemia with previous blood transfusions  Assessment and Plan:  Symptomatic anemia, likely secondary to GI bleed Currently asymptomatic  Hbg today 8.1 s/p EGD/Colonoscopy FOBT negative  Last colonoscopy in 2014- showed 3 sigmoid polyps and diverticulosis, had history of iron deficiency anema -GI following -Monitor for active hematemesis or melena -Monitor CBC  -Clear fluids -IV PPI -SCDs -EGD (07/10) A single 7 mm sessile polypoid lesion with bleeding was found in the duodenal bulb with fresh blood oozing from the center.  A single angioectasia without bleeding was found in the duodenal bulb  EGD otherwise normal appearing  per GI report -Colonoscopy  (07/10) Diverticulosis in the sigmoid colon  A 10 mm polypoid lesion was found in the proximal sigmoid colon-biopsies done.  Two 6-7 mm sessile polyps were found in the proximal ascending colon; polyps were removed     Per GI awaiting biopsy results  HTN  Chronic   BP today 136/49 appears to be controlled with carvedilol -continue carvdeilol -continue to holding lisinopril-HCTZ -continue to monitor BP  Gout Currently asymptomatic. Per chart review, home meds include Colchicine and Allopurinol.  Per patient she does not take these. -Call daughter for updated med list -Monitor  GERD Currently asymptomatic. On omeprazole at home. -IV PPI recent GI bleed  Anxiety On sertraline daily and as needed Xanax at home Continue home meds   FEN/GI: NPO, IV PPI  PPx: SCDs  Disposition: monitor Hbg and awaiting clearance by GI   Subjective: Pt sleeping in bed in NAD.  Reports no chest pain, SOB, abd pain.  Slept well overnight.  Reports no bleeding. Able to tolerate clear fluids.  No complaints voiced.    Objective: Temp:  [97.2 F (36.2 C)-98.3 F (36.8 C)] 98.2 F (36.8 C) (07/11 0503) Pulse Rate:  [82-94] 82 (07/11 0503) Resp:  [16-21] 16 (07/11 0503) BP: (122-169)/(49-94) 136/49 (07/11 0503) SpO2:  [90 %-97 %] 90 % (07/11 0503) Weight:  [72.6 kg] 72.6 kg (07/10 1315)   Physical Exam: General: pleasant confused but cooperative, pt in bed sleeping Cardiovascular:RRR, no murmurs appreciated Respiratory: CTAB, on room air Abdomen: soft and nontender, no distension, BS present   Extremities:no leg swelling Laboratory: Recent Labs  Lab 10/27/18 0140 10/27/18 0947 10/28/18 0211 10/29/18 0527  WBC 9.2  --  7.6 7.2  HGB 6.9* 7.9* 8.1* 8.1*  HCT 24.4* 26.9*  27.3* 27.1*  PLT 222  --  241 248   Recent Labs  Lab 10/27/18 0140 10/28/18 0211 10/29/18 0527  NA 140 139 136  K 4.6 4.2 4.3  CL 108 104 104  CO2 21* 25 22  BUN 31* 24* 16  CREATININE 1.19* 1.11* 1.04*   CALCIUM 8.2* 8.7* 8.5*  PROT  --  6.4*  --   BILITOT  --  1.3*  --   ALKPHOS  --  97  --   ALT  --  11  --   AST  --  13*  --   GLUCOSE 124* 108* 111*      Imaging/Diagnostic Tests: No results found.  Carollee Leitz, MD 10/29/2018, 9:08 AM PGY-1, [place] Intern pager: [phone], text pages welcome

## 2018-10-28 NOTE — Anesthesia Preprocedure Evaluation (Signed)
Anesthesia Evaluation  Patient identified by MRN, date of birth, ID band Patient awake    Reviewed: Allergy & Precautions, NPO status , Patient's Chart, lab work & pertinent test results  Airway Mallampati: II  TM Distance: >3 FB Neck ROM: Full    Dental no notable dental hx.    Pulmonary neg pulmonary ROS, former smoker,    Pulmonary exam normal breath sounds clear to auscultation       Cardiovascular hypertension, Pt. on medications negative cardio ROS Normal cardiovascular exam Rhythm:Regular Rate:Normal     Neuro/Psych negative neurological ROS  negative psych ROS   GI/Hepatic Neg liver ROS, GERD  ,  Endo/Other  negative endocrine ROS  Renal/GU Renal InsufficiencyRenal disease  negative genitourinary   Musculoskeletal negative musculoskeletal ROS (+)   Abdominal   Peds negative pediatric ROS (+)  Hematology negative hematology ROS (+)   Anesthesia Other Findings   Reproductive/Obstetrics negative OB ROS                             Anesthesia Physical Anesthesia Plan  ASA: II  Anesthesia Plan: MAC   Post-op Pain Management:    Induction: Intravenous  PONV Risk Score and Plan: 2 and Ondansetron, Midazolam and Treatment may vary due to age or medical condition  Airway Management Planned: Nasal Cannula  Additional Equipment:   Intra-op Plan:   Post-operative Plan:   Informed Consent: I have reviewed the patients History and Physical, chart, labs and discussed the procedure including the risks, benefits and alternatives for the proposed anesthesia with the patient or authorized representative who has indicated his/her understanding and acceptance.     Dental advisory given  Plan Discussed with: CRNA  Anesthesia Plan Comments:         Anesthesia Quick Evaluation

## 2018-10-28 NOTE — Progress Notes (Signed)
Spoke with Pt's daughter [name] and updated plan of care. Also got consent for EGD/colonoscopy via phone. All questions/concerns addressed.

## 2018-10-28 NOTE — Op Note (Signed)
[facility] [name] Procedure Date : 10/28/2018 MRN: [id number] Attending MD: Juanita Craver , MD Date of Birth: May 29, 1945 CSN: [id number] Age: 73 Admit Type: Inpatient Procedure:                EGD with biopsies and ablation of an AVM. Indications:              Iron deficiency anemia, Gastro-esophageal reflux                            disease Providers:                Juanita Craver, MD, Glori Bickers, RN, William Dalton,                            Technician, Mal Amabile, CRNA Referring MD:             Talbert Cage MD Medicines:                Monitored Anesthesia Care Complications:            No immediate complications. Estimated Blood Loss:     Estimated blood loss was minimal. Procedure:                Pre-Anesthesia Assessment: - Prior to the                            procedure, a history and physical was performed,                            and patient medications and allergies were                            reviewed. The patient's tolerance of previous                            anesthesia was also reviewed. The risks and                            benefits of the procedure and the sedation options                            and risks were discussed with the patient. All                            questions were answered, and informed consent was                            obtained. Prior Anticoagulants: The patient has                            taken no previous anticoagulant or antiplatelet                            agents except for aspirin. ASA Grade Assessment: II                            -  A patient with mild systemic disease. After                            reviewing the risks and benefits, the patient was                            deemed in satisfactory condition to undergo the                            procedure.                           After obtaining informed consent, the endoscope was   passed under direct vision. Throughout the                            procedure, the patient's blood pressure, pulse, and                            oxygen saturations were monitored continuously. The                            [id number]-[id number] ([id number] Olympus gastroscope was                            introduced through the mouth, and advanced to the                            second part of duodenum. The upper GI endoscopy was                            accomplished without difficulty. The patient                            tolerated the procedure well. Scope In: Scope Out: Findings:      The examined esophagus and GEJ appeared widely patent and normal.      Diffuse nodular mucosa was found in the entire examined stomach-biopsies       were taken with a cold forceps for histology.      The cardia and gastric fundus were otherwise normal on retroflexion.      A single 7 mm sessile polypoid lesion with bleeding was found in the       duodenal bulb with fresh blood oozing from the center.      The first portion of the duodenum and second portion of the duodenum       were normal.      A single angioectasia without bleeding was found in the duodenal bulb. Impression:               - Normal appearing, widely patent esophagus and GEJ.                           - Nodular mucosa in the entire stomach-biopsied.                           - A  single bleeding polypoid lesion in the duodenal                            bulb.                           - Normal first portion of the duodenum and second                            portion of the duodenum.                           - A single non-bleeding ?angioectasia in the                            duodenum-ablated with APC. Moderate Sedation:      MAC used. Recommendation:           - Clear liquid diet today.                           - Continue present medications. Procedure Code(s):        --- Professional ---                           [phone],  Esophagogastroduodenoscopy, flexible,                            transoral; with control of bleeding, any method Diagnosis Code(s):        --- Professional ---                           D50.9, Iron deficiency anemia, unspecified                           K21.9, Gastro-esophageal reflux disease without                            esophagitis                           K31.819, Angiodysplasia of stomach and duodenum                            without bleeding                           K31.7, Polyp of stomach and duodenum CPT copyright 2019 American Medical Association. All rights reserved. The codes documented in this report are preliminary and upon coder review may  be revised to meet current compliance requirements. Juanita Craver, MD Juanita Craver, MD 10/28/2018 2:45:30 PM This report has been signed electronically. Number of Addenda: 0

## 2018-10-28 NOTE — Anesthesia Postprocedure Evaluation (Signed)
Anesthesia Post Note  Patient: Hannah [name]  Procedure(s) Performed: ESOPHAGOGASTRODUODENOSCOPY (EGD) WITH PROPOFOL (N/A ) COLONOSCOPY WITH PROPOFOL (N/A ) HOT HEMOSTASIS (ARGON PLASMA COAGULATION/BICAP) (N/A ) BIOPSY POLYPECTOMY     Patient location during evaluation: Endoscopy Anesthesia Type: MAC Level of consciousness: awake and alert Pain management: pain level controlled Vital Signs Assessment: post-procedure vital signs reviewed and stable Respiratory status: spontaneous breathing, nonlabored ventilation and respiratory function stable Cardiovascular status: stable and blood pressure returned to baseline Postop Assessment: no apparent nausea or vomiting Anesthetic complications: no    Last Vitals:  Vitals:   10/28/18 1315 10/28/18 1433  BP: (!) 169/73 130/60  Pulse:    Resp: (!) 21 18  Temp: (!) 36.2 C (!) 36.3 C  SpO2: 95% 97%    Last Pain:  Vitals:   10/28/18 1433  TempSrc: Temporal  PainSc: 0-No pain                 Lynda Rainwater

## 2018-10-28 NOTE — Transfer of Care (Signed)
Immediate Anesthesia Transfer of Care Note  Patient: Hannah [name]  Procedure(s) Performed: ESOPHAGOGASTRODUODENOSCOPY (EGD) WITH PROPOFOL (N/A ) COLONOSCOPY WITH PROPOFOL (N/A ) HOT HEMOSTASIS (ARGON PLASMA COAGULATION/BICAP) (N/A ) BIOPSY POLYPECTOMY  Patient Location: PACU and Endoscopy Unit  Anesthesia Type:MAC  Level of Consciousness: awake and alert   Airway & Oxygen Therapy: Patient Spontanous Breathing  Post-op Assessment: Report given to RN and Post -op Vital signs reviewed and stable  Post vital signs: Reviewed and stable  Last Vitals:  Vitals Value Taken Time  BP    Temp    Pulse    Resp 22 10/28/18 1436  SpO2    Vitals shown include unvalidated device data.  Last Pain:  Vitals:   10/28/18 1315  TempSrc: Temporal  PainSc: 0-No pain         Complications: No apparent anesthesia complications

## 2018-10-29 ENCOUNTER — Inpatient Hospital Stay (HOSPITAL_COMMUNITY): Payer: Medicare Other

## 2018-10-29 LAB — CBC
HCT: 27.1 % — ABNORMAL LOW (ref 36.0–46.0)
HCT: 27.9 % — ABNORMAL LOW (ref 36.0–46.0)
Hemoglobin: 8.1 g/dL — ABNORMAL LOW (ref 12.0–15.0)
Hemoglobin: 8.5 g/dL — ABNORMAL LOW (ref 12.0–15.0)
MCH: 26.9 pg (ref 26.0–34.0)
MCH: 27 pg (ref 26.0–34.0)
MCHC: 29.9 g/dL — ABNORMAL LOW (ref 30.0–36.0)
MCHC: 30.5 g/dL (ref 30.0–36.0)
MCV: 90 fL (ref 80.0–100.0)
MCV: 88.6 fL (ref 80.0–100.0)
Platelets: 248 10*3/uL (ref 150–400)
Platelets: 270 10*3/uL (ref 150–400)
RBC: 3.01 MIL/uL — ABNORMAL LOW (ref 3.87–5.11)
RBC: 3.15 MIL/uL — ABNORMAL LOW (ref 3.87–5.11)
RDW: 14.9 % (ref 11.5–15.5)
RDW: 15 % (ref 11.5–15.5)
WBC: 7.2 10*3/uL (ref 4.0–10.5)
WBC: 7.9 10*3/uL (ref 4.0–10.5)
nRBC: 0 % (ref 0.0–0.2)
nRBC: 0 % (ref 0.0–0.2)

## 2018-10-29 LAB — BASIC METABOLIC PANEL
Anion gap: 10 (ref 5–15)
BUN: 16 mg/dL (ref 8–23)
CO2: 22 mmol/L (ref 22–32)
Calcium: 8.5 mg/dL — ABNORMAL LOW (ref 8.9–10.3)
Chloride: 104 mmol/L (ref 98–111)
Creatinine, Ser: 1.04 mg/dL — ABNORMAL HIGH (ref 0.44–1.00)
GFR calc Af Amer: >60 mL/min (ref >60)
GFR calc non Af Amer: 53 mL/min — ABNORMAL LOW (ref >60)
Glucose, Bld: 111 mg/dL — ABNORMAL HIGH (ref 70–99)
Potassium: 4.3 mmol/L (ref 3.5–5.1)
Sodium: 136 mmol/L (ref 135–145)

## 2018-10-29 MED ORDER — IOHEXOL 350 MG/ML SOLN
100.0000 mL | Freq: Once | INTRAVENOUS | Status: AC | PRN
  Administered 2018-10-29: 01:00:00 100 mL via INTRAVENOUS

## 2018-10-29 MED ORDER — ALPRAZOLAM 0.25 MG PO TABS
0.2500 mg | ORAL_TABLET | ORAL | Status: AC
  Administered 2018-10-29: 0.25 mg via ORAL
  Filled 2018-10-29: qty 1

## 2018-10-29 NOTE — Progress Notes (Signed)
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 [name] has an understanding of the terminology and wants to be informed if patient gets worst with behaviors.

## 2018-10-29 NOTE — Progress Notes (Signed)
Called daughter [name] to speak to patient, daughter convinced patient it was ok to take xanax for her increase agitation. Patient has pulled out her piv this shift and keeps getting out of chair/bed to ambulate in room. D/c'd telemetry due to continuing to remove tele leads. Remaining piv remains saline locked until patient is calmer.

## 2018-10-29 NOTE — Progress Notes (Signed)
Interim Progress Note SUBJECTIVE Paged about patient agitation and attempts to climb over bed. Patient has a history of dementia.  Previous note expressing concerning behaviors earlier this evening; however, not noted prior to that. There is a Actuary at bedside. Request for IM Haldol.   OBJECTIVE:  Gen: sitting on the edge of bed, holding on to railing, appears anxious. Sitter at bedside.  Neuro: Alert and oriented to self only. Believes she is in a motel or hotel and is going to drive to [facility] in the morning by herself to see her son and his twin daughters, which is why she is so anxious. Pt has to be re-oriented every 1-3 minutes.   A/P  AMS 2/2 to dementia with anxiety   One time dose 0.25 xanax   Continue to re-orient   Breathing exercises and conversation seem to make the patient less anxious.    Wilber Oliphant, M.D.  PGY-2  Family Medicine  10/29/2018 10:32 PM

## 2018-10-29 NOTE — Plan of Care (Signed)

## 2018-10-29 NOTE — Progress Notes (Signed)
[facility] Progress Note    Since last GI note: EGD, colonoscopy and CT angio yesterday.  All those results reviewed.  She reports no obvious bleeding (no melena, no BRBPR).    Objective: Vital signs in last 24 hours: Temp:  [97.4 F (36.3 C)-98.6 F (37 C)] 98.6 F (37 C) (07/11 1249) Pulse Rate:  [77-94] 77 (07/11 1249) Resp:  [16-21] 18 (07/11 1249) BP: (122-149)/(49-94) 131/53 (07/11 1249) SpO2:  [90 %-97 %] 94 % (07/11 1249) Last BM Date: 10/28/18 General: alert and oriented times 3 Heart: regular rate and rythm Abdomen: soft, non-tender, non-distended, normal bowel sounds   Lab Results: Recent Labs    10/27/18 0140 10/27/18 0947 10/28/18 0211 10/29/18 0527  WBC 9.2  --  7.6 7.2  HGB 6.9* 7.9* 8.1* 8.1*  PLT 222  --  241 248  MCV 92.1  --  88.9 90.0   Recent Labs    10/27/18 0140 10/28/18 0211 10/29/18 0527  NA 140 139 136  K 4.6 4.2 4.3  CL 108 104 104  CO2 21* 25 22  GLUCOSE 124* 108* 111*  BUN 31* 24* 16  CREATININE 1.19* 1.11* 1.04*  CALCIUM 8.2* 8.7* 8.5*   Recent Labs    10/28/18 0211  PROT 6.4*  ALBUMIN 3.3*  AST 13*  ALT 11  ALKPHOS 97  BILITOT 1.3*   Recent Labs    10/27/18 0330  INR 1.2     Medications: Scheduled Meds: . atorvastatin  40 mg Oral Daily  . carvedilol  12.5 mg Oral BID WC  . pantoprazole (PROTONIX) IV  40 mg Intravenous Q12H  . sertraline  25 mg Oral Daily   Continuous Infusions: . sodium chloride    . sodium chloride 100 mL/hr at 10/29/18 0418   PRN Meds:.acetaminophen, ALPRAZolam    Assessment/Plan: 73 y.o. female admitted with symptomatic anemia  Admitting Hb was 6.9 (down from 10.2 about 4 months prior). She's had no overt bleeding and actually her Stool was FOB negative at admission.  Colonoscopy and EGD yesteday. There was a small duodenal AVM that was ablated with APC. Nodular gastritis was also noted, biopsied. Dr. Collene Mares also noted a bleeding polypoid lesion in the duodenal bulb.  A  polypoid lesion in sigmoid was biopsied and two small ascending colon polyps were removed with hot snare.  She was concerned about the bleeding duodenal lesion and after discussing with radiology a CT angio was ordered.  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    Note that she has not seen overt bleeding but she has mild dementia and I am not certain that I believe that.    I discussed her case with Dr. Collene Mares, She explained to me that the lesion in the duodenum was briefly "spurting blood" and we agree to consult IR to consider elective GDA branch coil.    Will make her NPO for now.  Milus Banister, MD  10/29/2018, 1:25 PM [facility] Pager [phone]

## 2018-10-30 ENCOUNTER — Inpatient Hospital Stay (HOSPITAL_COMMUNITY): Payer: Medicare Other

## 2018-10-30 ENCOUNTER — Encounter (HOSPITAL_COMMUNITY): Payer: Self-pay | Admitting: Interventional Radiology

## 2018-10-30 DIAGNOSIS — K922 Gastrointestinal hemorrhage, unspecified: Secondary | ICD-10-CM

## 2018-10-30 HISTORY — PX: IR EMBO ART  VEN HEMORR LYMPH EXTRAV  INC GUIDE ROADMAPPING: IMG5450

## 2018-10-30 HISTORY — PX: IR ANGIOGRAM VISCERAL SELECTIVE: IMG657

## 2018-10-30 HISTORY — PX: IR US GUIDANCE: IMG2393

## 2018-10-30 HISTORY — PX: IR ANGIOGRAM SELECTIVE EACH ADDITIONAL VESSEL: IMG667

## 2018-10-30 LAB — BASIC METABOLIC PANEL
Anion gap: 9 (ref 5–15)
BUN: 11 mg/dL (ref 8–23)
CO2: 23 mmol/L (ref 22–32)
Calcium: 8.6 mg/dL — ABNORMAL LOW (ref 8.9–10.3)
Chloride: 107 mmol/L (ref 98–111)
Creatinine, Ser: 1.02 mg/dL — ABNORMAL HIGH (ref 0.44–1.00)
GFR calc Af Amer: >60 mL/min (ref >60)
GFR calc non Af Amer: 54 mL/min — ABNORMAL LOW (ref >60)
Glucose, Bld: 115 mg/dL — ABNORMAL HIGH (ref 70–99)
Potassium: 3.6 mmol/L (ref 3.5–5.1)
Sodium: 139 mmol/L (ref 135–145)

## 2018-10-30 LAB — TROPONIN I (HIGH SENSITIVITY)
Troponin I (High Sensitivity): 19 ng/L — ABNORMAL HIGH (ref <18)
Troponin I (High Sensitivity): 25 ng/L — ABNORMAL HIGH (ref <18)
Troponin I (High Sensitivity): 23 ng/L — ABNORMAL HIGH (ref <18)
Troponin I (High Sensitivity): 20 ng/L — ABNORMAL HIGH (ref <18)

## 2018-10-30 LAB — COMPREHENSIVE METABOLIC PANEL
ALT: 11 U/L (ref 0–44)
AST: 14 U/L — ABNORMAL LOW (ref 15–41)
Albumin: 2.9 g/dL — ABNORMAL LOW (ref 3.5–5.0)
Alkaline Phosphatase: 81 U/L (ref 38–126)
Anion gap: 10 (ref 5–15)
BUN: 11 mg/dL (ref 8–23)
CO2: 21 mmol/L — ABNORMAL LOW (ref 22–32)
Calcium: 8.2 mg/dL — ABNORMAL LOW (ref 8.9–10.3)
Chloride: 109 mmol/L (ref 98–111)
Creatinine, Ser: 1.02 mg/dL — ABNORMAL HIGH (ref 0.44–1.00)
GFR calc Af Amer: >60 mL/min (ref >60)
GFR calc non Af Amer: 54 mL/min — ABNORMAL LOW (ref >60)
Glucose, Bld: 146 mg/dL — ABNORMAL HIGH (ref 70–99)
Potassium: 3.8 mmol/L (ref 3.5–5.1)
Sodium: 140 mmol/L (ref 135–145)
Total Bilirubin: 0.9 mg/dL (ref 0.3–1.2)
Total Protein: 5.9 g/dL — ABNORMAL LOW (ref 6.5–8.1)

## 2018-10-30 LAB — CBC
HCT: 25.8 % — ABNORMAL LOW (ref 36.0–46.0)
HCT: 26.8 % — ABNORMAL LOW (ref 36.0–46.0)
Hemoglobin: 7.7 g/dL — ABNORMAL LOW (ref 12.0–15.0)
Hemoglobin: 7.9 g/dL — ABNORMAL LOW (ref 12.0–15.0)
MCH: 26.7 pg (ref 26.0–34.0)
MCH: 27.1 pg (ref 26.0–34.0)
MCHC: 29.8 g/dL — ABNORMAL LOW (ref 30.0–36.0)
MCHC: 29.5 g/dL — ABNORMAL LOW (ref 30.0–36.0)
MCV: 89.6 fL (ref 80.0–100.0)
MCV: 91.8 fL (ref 80.0–100.0)
Platelets: 232 10*3/uL (ref 150–400)
Platelets: 218 10*3/uL (ref 150–400)
RBC: 2.88 MIL/uL — ABNORMAL LOW (ref 3.87–5.11)
RBC: 2.92 MIL/uL — ABNORMAL LOW (ref 3.87–5.11)
RDW: 14.9 % (ref 11.5–15.5)
RDW: 15.4 % (ref 11.5–15.5)
WBC: 6.7 10*3/uL (ref 4.0–10.5)
WBC: 8.6 10*3/uL (ref 4.0–10.5)
nRBC: 0 % (ref 0.0–0.2)
nRBC: 0 % (ref 0.0–0.2)

## 2018-10-30 LAB — BLOOD GAS, ARTERIAL
Acid-base deficit: 2.5 mmol/L — ABNORMAL HIGH (ref 0.0–2.0)
Bicarbonate: 22.9 mmol/L (ref 20.0–28.0)
Delivery systems: POSITIVE
Drawn by: 244861
Expiratory PAP: 8
FIO2: 40
Inspiratory PAP: 16
O2 Saturation: 96.6 %
Patient temperature: 98.6
RATE: 8 resp/min
pCO2 arterial: 47.5 mmHg (ref 32.0–48.0)
pH, Arterial: 7.305 — ABNORMAL LOW (ref 7.350–7.450)
pO2, Arterial: 95.2 mmHg (ref 83.0–108.0)

## 2018-10-30 LAB — GLUCOSE, CAPILLARY: Glucose-Capillary: 172 mg/dL — ABNORMAL HIGH (ref 70–99)

## 2018-10-30 MED ORDER — MIDAZOLAM HCL 2 MG/2ML IJ SOLN
INTRAMUSCULAR | Status: AC
  Filled 2018-10-30: qty 2

## 2018-10-30 MED ORDER — FENTANYL CITRATE (PF) 100 MCG/2ML IJ SOLN
INTRAMUSCULAR | Status: AC | PRN
  Administered 2018-10-30: 12.5 ug via INTRAVENOUS
  Administered 2018-10-30: 25 ug via INTRAVENOUS
  Administered 2018-10-30: 12.5 ug via INTRAVENOUS

## 2018-10-30 MED ORDER — ALPRAZOLAM 0.25 MG PO TABS
0.2500 mg | ORAL_TABLET | Freq: Once | ORAL | Status: AC
  Administered 2018-10-30: 0.25 mg via ORAL
  Filled 2018-10-30: qty 1

## 2018-10-30 MED ORDER — LIDOCAINE HCL 1 % IJ SOLN
INTRAMUSCULAR | Status: AC
  Filled 2018-10-30: qty 20

## 2018-10-30 MED ORDER — MIDAZOLAM HCL 2 MG/2ML IJ SOLN
INTRAMUSCULAR | Status: AC | PRN
  Administered 2018-10-30 (×3): 0.5 mg via INTRAVENOUS

## 2018-10-30 MED ORDER — SODIUM CHLORIDE 0.9 % IV BOLUS
1000.0000 mL | Freq: Once | INTRAVENOUS | Status: AC
  Administered 2018-10-30: 16:00:00 1000 mL via INTRAVENOUS

## 2018-10-30 MED ORDER — FENTANYL CITRATE (PF) 100 MCG/2ML IJ SOLN
INTRAMUSCULAR | Status: AC
  Filled 2018-10-30: qty 2

## 2018-10-30 NOTE — Significant Event (Signed)
Rapid Response Event Note Secretary called, RN requesting me at bedside pt desating. Asked to call RT and MD I was finishing with a code.  Overview:  On arrival pt on Bipap, MD at bedside, pt alert and oriented. Pt was laid flat for peri care and in/out cath when she dropped her O2 sats to 86% on 2L [place].     Initial Focused Assessment: Pt alert and oriented, tolerating Bipap   Interventions: Bipap EKG ABG 7.30/47.5/95.2/22.9 CBC, CMP,  Troponin   Plan of Care (if not transferred): Continue to monitor, Call RRT as needed Event Summary:   at      at          [facility], Sela Hua

## 2018-10-30 NOTE — Progress Notes (Signed)
Pt off BIPAP and tolerating nasal cannula well at this time.  RT will continue to monitor.

## 2018-10-30 NOTE — Progress Notes (Signed)
Went to see patient with Dr. Ky Barban. Patient without complaints, breathing without difficulty, satting 100% on 4L O2 via nasal cannula. Speaking in full sentences without signs of anxiety. She did require some reminding of where she was and her hospital course but once reminded why she was here she was able to remember other aspects of her hospital course.  Plan to wean O2 to room air and monitor patient O2 sats.  General: Alert, no apparent distress Heart: Regular rate and rhythm with no murmurs appreciated Lungs: CTA bilaterally, no wheezing, on 4L O2 via nasal cannula Abdomen: Bowel sounds present Skin: Warm and dry Extremities: No lower extremity edema

## 2018-10-30 NOTE — Procedures (Signed)
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    Complications: None Immediate  EBL: None  Keep right leg straight for 4 hrs (until 1500).    Signed: Sandi Mariscal Pager: [phone] 10/30/2018, 11:08 AM

## 2018-10-30 NOTE — Progress Notes (Signed)
Pt presented with increased anxiety and restlessness. RN administered PRN daily xanax. Then, due to pt needing to lay flat post IR procedure RN in and out catheterized pt to try to help continue to decrease anxiety. Pt only progressively became more anxious and dyspneic. Pt had a BM. Pt's O2 continued to decrease to 80%. RN placed pt on non-rebreather - with little improvement RN called rapid response team (in code), respiratory therapist (for possible breathing tx or bipap), and requested attending to visit pt.   Pt placed on BiPAP. Pt's HR, BP, and O2 level improved as placed on Bipap.   RN called and made IR aware of pt's complications along w/ giving Posey Pronto MD Pascal Lux MD's contact information (was in a procedure at [facility]).   RN will continue to monitor pt.

## 2018-10-30 NOTE — Progress Notes (Signed)
Patient agitated with attempts to climb out of bed. Pt exhibiting periods of confusion and unable to Reorient/ Redirect. MD notified with order received to administer Xanax 0.25 mg . Medication given with minimal relief. To monitor and treat pt per MD and nursing orders

## 2018-10-30 NOTE — Progress Notes (Signed)
Covering for Dr. Collene Mares.  She was down getting elective embolization of GDA branch when I came to see her this morning.  Her Hb drifted a bit and if it is even lower tomorrow (<7) then would transfuse another unit of blood.    Dr. Collene Mares to resume her care tomorrow morning.

## 2018-10-30 NOTE — Progress Notes (Signed)
Pt with panic attack / severe anxiety . Pt sating @ 97% on RA. Pt placed 2 L via [place]  with improvement of sats to 100%. Pt was placed on Telemetry and Pulse OX monitoring. Xanax 0.25 given with minimal relief. To continue to monitor closely

## 2018-10-30 NOTE — Progress Notes (Signed)
Dr Lattie Haw, MD  Asked to see patient as she had difficulty in breathing. Rapid response team called but were at a code.   I attended patient with Dr Ouida Sills. Pt was alert but visibly dyspneic. On rebreathe mask and sitting up in bed.    On examination Alert, able to follow commands Chest clear, no wheeze or crackles  Wound site: no tender or pulsatile masses, no bleeding, haematoma, abdo soft non tender.   On advice of Dr Ouida Sills: 1) CBC, CMP. Low threshold for transfusion  2) ABG 3) Troponin  4) EKG-sinus tachycardia  5) nurses to contact IR  6) BiPAP PRN 7) CXR 9) Discontinued Zanex

## 2018-10-30 NOTE — Progress Notes (Addendum)
FMTS Attending Daily Note:  Hannah Netters MD Personal pager:  [phone] FPTS Service Pager:  [phone]  I have seen and examined this patient and have reviewed their chart. I have discussed this patient with the resident. I agree with the resident's findings, assessment and care plan.  Additionally:  Underwent IR embolization of ectatic gastroduodenal artery today. I saw patient after procedure in her regular room and she was doing well Events of the afternoon noted - unclear if anxiety mediated dyspnea without hypoxia, or if some other organic process. Workup underway.  Hannah Rio, MD    10/30/2018  Family Medicine Teaching Service Daily Progress Note Intern Pager: [phone]  [name] Medical record number: [id number] Date of birth: May 03, 1945 Age: 73 y.o. Gender: female  Primary Care Provider: [name], MD Consultants: GI  Code Status: Full   Pt Overview and Major Events to Date:  Hannah [name] is a 73 y.o. female presenting with abdominal pain, vomiting and diarrheax 2 . Found to have Hb 6.9 on admission. PMH is significant for HTN, HLD,GERD, Arthritis,Anxiety, Anemia with previous blood transfusions  Assessment and Plan:  Symptomatic anemia, likely secondary to GI bleed EGD (07/10) A single 7 mm sessile polypoid lesion with bleeding was found in the duodenal bulb with fresh blood oozing from the center.  A single angioectasia without bleeding was found in the duodenal bulb  EGD otherwise normal appearing  per GI report  Colonoscopy (07/10) Diverticulosis in the sigmoid colon  A 10 mm polypoid lesion was found in the proximal sigmoid colon-biopsies done.  Two 6-7 mm sessile polyps were found in the proximal ascending colon; polyps were removed     Per GI awaiting biopsy results  Percutaneous mesenteric arteriogram and percutaneous coil embolization 7/12 Embolization of pseudoaneurym found on CT Abdomen, pelvis  scan Procedure went well, no complications   Currently asymptomatic  Hb 7.7 today (8.5) 2 s/p EGD/Colonoscopy. GI would like to keep Hb over 7. Transfuse if Hb drops under 7. FOBT negative  -GI following-many thanks for recommendation -Monitor for active hematemesis or melena -Monitor CBC  -Clear fluids -IV PPI -SCDs   HTN  Chronic   BP today 130-161/72-94 appears to be controlled with carvedilol -continue carvdeilol -continue to holding lisinopril-HCTZ -continue to monitor BP  Gout Currently asymptomatic. Per chart review, home meds include Colchicine and Allopurinol.  Per patient she does not take these. -Call daughter for updated med list -Monitor  GERD Currently asymptomatic. On omeprazole at home. -IV PPI recent GI bleed  Anxiety On sertraline daily and as needed Xanax at home Continue home meds  FEN/GI: NPO, IV PPI  PPx: SCDs  Disposition: monitor Hbg and awaiting clearance by GI   Subjective: Pt extremely anxious post procedure. Wants to pass urine but not allowed to raise leg post procedure. Refusing to use external catheter in place to pass urine into. Denies chest pain, dyspnea, nausea or vomiting  Objective: Temp:  [97.8 F (36.6 C)-98.6 F (37 C)] 98.4 F (36.9 C) (07/12 0501) Pulse Rate:  [77-117] 103 (07/12 0501) Resp:  [16-20] 20 (07/12 0501) BP: (130-161)/(53-94) 161/72 (07/12 0501) SpO2:  [89 %-100 %] 100 % (07/12 0501)    General: Extremely anxious and agitated Cardio: Normal S1 and S2, no S3 or S4. Rhythm is regular. No murmurs or rubs.   Pulm: Anterior auscultation of lungs clear. Unable to move patient due to post procedure protocol. Abdomen:. Abdomen soft non tender. Bowel sounds present Extremities: No  peripheral edema. Warm/ well perfused.   Neuro: Cranial nerves grossly intact  Laboratory: Recent Labs  Lab 10/29/18 0527 10/29/18 1643 10/30/18 0339  WBC 7.2 7.9 6.7  HGB 8.1* 8.5* 7.7*  HCT 27.1* 27.9* 25.8*  PLT 248 270 232    Recent Labs  Lab 10/28/18 0211 10/29/18 0527 10/30/18 0339  NA 139 136 139  K 4.2 4.3 3.6  CL 104 104 107  CO2 25 22 23   BUN 24* 16 11  CREATININE 1.11* 1.04* 1.02*  CALCIUM 8.7* 8.5* 8.6*  PROT 6.4*  --   --   BILITOT 1.3*  --   --   ALKPHOS 97  --   --   ALT 11  --   --   AST 13*  --   --   GLUCOSE 108* 111* 115*      Imaging/Diagnostic Tests: Ct Angio Abd/pel W/ And/or W/o  Addendum Date: 10/29/2018   ADDENDUM REPORT: 10/29/2018 09:42 ADDENDUM: There are several small nodular densities in the right lower lobe. Largest nodular area measures 7 x 4 mm on sequence 14, image 14. Many of these nodular areas are irregular. Findings are suggestive for an infectious or inflammatory process but nonspecific. These nodular areas are new from a chest CT on 06/03/2004. Non-contrast chest CT at 3-6 months is recommended. If the nodules are stable at time of repeat CT, then future CT at 18-24 months (from today's scan) is considered optional for low-risk patients, but is recommended for high-risk patients. This recommendation follows the consensus statement: Guidelines for Management of Incidental Pulmonary Nodules Detected on CT Images: From the Fleischner Society 2017; Radiology 2017; 284:228-243. These results will be called to the ordering clinician or representative by the Radiologist Assistant, and communication documented in the PACS or zVision Dashboard. Electronically Signed   By: Markus Daft M.D.   On: 10/29/2018 09:42   Result Date: 10/29/2018 CLINICAL DATA:  73 year old with GI bleeding. Recent upper endoscopy demonstrates a 7 mm polypoid lesion with bleeding at the duodenal bulb. EXAM: CT ANGIOGRAPHY ABDOMEN AND PELVIS WITH CONTRAST AND WITHOUT CONTRAST TECHNIQUE: Multidetector CT imaging of the abdomen and pelvis was performed using the standard protocol during bolus administration of intravenous contrast. Multiplanar reconstructed images and MIPs were obtained and reviewed to evaluate  the vascular anatomy. CONTRAST:  181mL OMNIPAQUE IOHEXOL 350 MG/ML SOLN COMPARISON:  None. FINDINGS: VASCULAR Aorta: Calcified atherosclerotic disease in the abdominal aorta without aneurysm or dissection. Celiac: Celiac trunk is widely patent. Main branch vessels of the celiac trunk are the left gastric artery, splenic artery and common hepatic artery. Focal calcifications in the distal splenic artery may represent small aneurysms, largest measuring 0.6 cm. There is focal ectasia of the distal gastroduodenal artery on sequence 5, image 71. This area of ectasia measures 1.1 cm in length and the diameter of the vessel measures up to 0.6 cm. The GDA vessel measures roughly 0.4 cm. This area of ectasia is at a branch site. There is no active contrast extravasation around this area of vessel enlargement. Findings could represent a small pseudoaneurysm. There is no evidence for contrast extravasation within duodenum. This focal ectasia and possible pseudoaneurysm involving the distal GDA is along the posterior caudal aspect of the duodenal bulb region. No evidence for an enhancing intraluminal polypoid lesion. SMA: Evidence for noncalcified plaque at the origin of SMA causing at least mild stenosis at the origin. Main branch vessels of the SMA are patent. Renals: Both renal arteries are patent without evidence of  aneurysm, dissection, vasculitis, fibromuscular dysplasia or significant stenosis. IMA: IMA is patent. Inflow: Atherosclerotic plaque at the origin of the right common iliac artery without significant stenosis. Right iliac arteries are patent. Atherosclerotic disease in the right internal iliac artery. Mild plaque in left iliac arteries without significant stenosis. Proximal Outflow: Proximal femoral arteries are patent bilaterally without significant stenosis. Veins: Portal venous system is patent. IVC and renal veins are patent. No gross abnormality to the proximal femoral or iliac veins. Review of the MIP  images confirms the above findings. NON-VASCULAR Lower chest: Chest and septal thickening in the lower lungs with small bilateral pleural effusions. Hepatobiliary: Normal appearance of the liver and gallbladder. No biliary dilatation. Pancreas: Unremarkable. No pancreatic ductal dilatation or surrounding inflammatory changes. Spleen: Normal in size without focal abnormality. Adrenals/Urinary Tract: Normal appearance of the adrenal glands. Small hypodensity in the right kidney lower pole is too small to definitively characterize. No hydronephrosis. No suspicious renal lesions. Urinary bladder is unremarkable. Stomach/Bowel: Prominent diverticulosis in the sigmoid colon without acute colonic inflammation. Evidence for normal appendix. Normal appearance of the stomach. Normal appearance of the duodenum. No extrinsic compression or lesion in the region of the duodenal bulb or descending duodenum. Minimal fat stranding between the pancreatic head and duodenum is nonspecific. Again, no active contrast extravasation in the duodenal bulb region. Negative for small bowel dilatation. Lymphatic: No abdominopelvic lymphadenopathy. Reproductive: Uterus and bilateral adnexa are unremarkable. Incidentally, there are calcifications associated with the right ovary or right adnexa. Other: Negative free air.  Negative for free fluid. Musculoskeletal: Mild dextroscoliosis in the lumbar spine. Multilevel degenerative disc space narrowing with vacuum disc phenomena in the lumbar spine. Vertebral body heights are maintained. IMPRESSION: VASCULAR 1. Focal ectasia and irregularity of the distal gastroduodenal artery at a branch site. Findings could represent a small pseudoaneurysm. This area of vessel enlargement is along the posterior and caudal aspect of the duodenal bulb. There is no active contrast extravasation associated with this small pseudoaneurysm or ectasia and no evidence for contrast within the duodenum. 2.  Aortic  Atherosclerosis (ICD10-I70.0). 3. 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 4. Two small calcified areas involving distal splenic artery which could represent small aneurysms, largest measuring 0.6 cm. NON-VASCULAR 1. No acute abnormality in the abdomen or pelvis. 2. Small bilateral pleural effusions. Electronically Signed: By: Markus Daft M.D. On: 10/29/2018 09:19    Lattie Haw, MD 10/30/2018, 6:47 AM PGY-1, [place] Intern pager: [phone], text pages welcome

## 2018-10-30 NOTE — Progress Notes (Signed)
Patient daughter updated on change of status of patient as well as plan of care. Patient resting comfortably on bi-pap.

## 2018-10-30 NOTE — Plan of Care (Signed)

## 2018-10-31 ENCOUNTER — Inpatient Hospital Stay (HOSPITAL_COMMUNITY): Payer: Medicare Other

## 2018-10-31 ENCOUNTER — Encounter (HOSPITAL_COMMUNITY): Payer: Self-pay | Admitting: Gastroenterology

## 2018-10-31 DIAGNOSIS — I340 Nonrheumatic mitral (valve) insufficiency: Secondary | ICD-10-CM

## 2018-10-31 LAB — CBC
HCT: 24.8 % — ABNORMAL LOW (ref 36.0–46.0)
Hemoglobin: 7.3 g/dL — ABNORMAL LOW (ref 12.0–15.0)
MCH: 26.8 pg (ref 26.0–34.0)
MCHC: 29.4 g/dL — ABNORMAL LOW (ref 30.0–36.0)
MCV: 91.2 fL (ref 80.0–100.0)
Platelets: 207 10*3/uL (ref 150–400)
RBC: 2.72 MIL/uL — ABNORMAL LOW (ref 3.87–5.11)
RDW: 15.4 % (ref 11.5–15.5)
WBC: 8.5 10*3/uL (ref 4.0–10.5)
nRBC: 0 % (ref 0.0–0.2)

## 2018-10-31 LAB — ECHOCARDIOGRAM COMPLETE
Height: 62.25 in
Weight: 2560 oz

## 2018-10-31 LAB — BASIC METABOLIC PANEL
Anion gap: 7 (ref 5–15)
BUN: 12 mg/dL (ref 8–23)
CO2: 23 mmol/L (ref 22–32)
Calcium: 8.1 mg/dL — ABNORMAL LOW (ref 8.9–10.3)
Chloride: 109 mmol/L (ref 98–111)
Creatinine, Ser: 0.92 mg/dL (ref 0.44–1.00)
GFR calc Af Amer: >60 mL/min (ref >60)
GFR calc non Af Amer: >60 mL/min (ref >60)
Glucose, Bld: 92 mg/dL (ref 70–99)
Potassium: 3.6 mmol/L (ref 3.5–5.1)
Sodium: 139 mmol/L (ref 135–145)

## 2018-10-31 MED ORDER — LORAZEPAM 2 MG/ML IJ SOLN
0.5000 mg | Freq: Once | INTRAMUSCULAR | Status: AC
  Administered 2018-10-31: 21:00:00 0.5 mg via INTRAVENOUS
  Filled 2018-10-31: qty 1

## 2018-10-31 MED ORDER — LISINOPRIL 5 MG PO TABS
2.5000 mg | ORAL_TABLET | Freq: Every day | ORAL | Status: DC
  Administered 2018-10-31 – 2018-11-02 (×3): 2.5 mg via ORAL
  Filled 2018-10-31 (×3): qty 1

## 2018-10-31 MED ORDER — HALOPERIDOL LACTATE 5 MG/ML IJ SOLN
1.0000 mg | Freq: Once | INTRAMUSCULAR | Status: AC
  Administered 2018-10-31: 1 mg via INTRAVENOUS
  Filled 2018-10-31: qty 1

## 2018-10-31 MED ORDER — FUROSEMIDE 10 MG/ML IJ SOLN
40.0000 mg | Freq: Once | INTRAMUSCULAR | Status: AC
  Administered 2018-10-31: 40 mg via INTRAVENOUS
  Filled 2018-10-31: qty 4

## 2018-10-31 MED ORDER — ALPRAZOLAM 0.25 MG PO TABS
0.2500 mg | ORAL_TABLET | Freq: Every evening | ORAL | Status: DC | PRN
  Administered 2018-11-01: 22:00:00 0.25 mg via ORAL
  Filled 2018-10-31 (×2): qty 1

## 2018-10-31 NOTE — Care Management Important Message (Signed)
Important Message  Patient Details  Name: Hannah [name] MRN: [id number] Date of Birth: 04-08-1946   Medicare Important Message Given:  Yes     Hannah [name] 10/31/2018, 4:40 PM

## 2018-10-31 NOTE — Progress Notes (Signed)
Not able to get out of patient's room throughout afternoon alarm has been going off. Patient confused and not following avasys sitters request to get back to bed.  Risk of falling.  Pulling off tele multiple times.  Multiple employees spending copious amounts of time in her room.  Avasys sitter stating she needs a person d/t not following commands.  Changing order in system to person at bedside. Will continue to monitor.

## 2018-10-31 NOTE — Progress Notes (Signed)
Patient extremely confused and agitated this evening. Attempts at redirecting with no relief. Patient not following commands and constantly getting up without calling for help. Writer called daughter and placed her on phone with patient to help calm her. Continuing to monitor mood for rest of shift.

## 2018-10-31 NOTE — Progress Notes (Signed)
10/31/2018  Family Medicine Teaching Service Daily Progress Note Intern Pager: [phone]  [name] Medical record number: [id number] Date of birth: 10-06-1945 Age: 73 y.o. Gender: female  Primary Care Provider: [name], MD Consultants: GI  Code Status: Full   Pt Overview and Major Events to Date:  Hannah [name] is a 73 y.o. female presenting with abdominal pain, vomiting and diarrheax 2 . Found to have Hb 6.9 on admission. PMH is significant for HTN, HLD,GERD, Arthritis,Anxiety, Anemia with previous blood transfusions  Assessment and Plan:  Symptomatic anemia, likely secondary to GI bleed EGD (07/10) A single 7 mm sessile polypoid lesion with bleeding was found in the duodenal bulb with fresh blood oozing from the center.  A single angioectasia without bleeding was found in the duodenal bulb  EGD otherwise normal appearing  per GI report  Colonoscopy (07/10) Diverticulosis in the sigmoid colon  A 10 mm polypoid lesion was found in the proximal sigmoid colon-biopsies done.  Two 6-7 mm sessile polyps were found in the proximal ascending colon; polyps were removed     Per GI awaiting biopsy results  Percutaneous mesenteric arteriogram and percutaneous coil embolization 7/12 Embolization of pseudoaneurym found on CT Abdomen, pelvis scan Procedure went well, no complications   Currently asymptomatic  Hb 7.3 today (7.9 7/12) 2 s/p EGD/Colonoscopy. GI would like to keep Hb over 7. Transfuse if Hb drops under 7. FOBT negative  -GI following-many thanks for recommendations -Monitor for active hematemesis or melena -Monitor CBC as per IR note -IV PPI -SCDs  Hypoxia ?underlying cause Sats 97% On 4L, 92% on room air CXR after rapid response 7/12: pulmonary edema -consider adding furosemide -if needing fluids-with caution -Consider echo, last echo 2006-Overall left ventricular systolic function was at the lower limits of normal. Left  ventricular ejection fraction was estimated , range being 50% to 55 %Left ventricular size was normal.   HTN  Chronic   BP today over last 24 hrs 155-212/58-103. Patient was very anxious yesterday which lead to a rapid response being called.On carvedilol and lisinopril-hctz at home. -continue carvdeilol -consider restarting lisinopril-hctz if pt's Bp tolerates -continue to monitor BP  Gout Currently asymptomatic. Per chart review, home meds include Colchicine and Allopurinol.  Per patient she does not take these. -Call daughter for updated med list -Monitor  GERD Currently asymptomatic. On omeprazole at home. -IV PPI recent GI bleed  Anxiety On sertraline daily and as needed Xanax at home Continue home meds  FEN/GI: NPO, IV PPI  PPx: SCDs  Disposition: monitor Hb and awaiting clearance by GI   Subjective:  Pt lying down in bed with nasal cannula. Says she feels well and slept well. Denies chest pain/dyspnea/vomiting/nausea. Concerned about the results of her investigations and wants to go home. I explained that she is doing well and that we are still investigating her current medical problems (did not speak in detail about this to avoid triggering her anxiety again).  Objective: Temp:  [98 F (36.7 C)-98.9 F (37.2 C)] 98.9 F (37.2 C) (07/13 0510) Pulse Rate:  [68-123] 88 (07/13 0510) Resp:  [13-70] 13 (07/12 1050) BP: (114-212)/(52-132) 155/58 (07/13 0510) SpO2:  [86 %-100 %] 100 % (07/13 0510)    General: Alert, orientated, looks well. Nasal cannula in situ  HEENT: Neck non-tender without lymphadenopathy, masses or thyromegaly Cardio: Normal S1 and S2, no S3 or S4. Rhythm is regular. No murmurs or rubs.   Pulm: new bibasal crackles, wheezing, or diminished breath  sounds. Normal respiratory effort Abdomen: Bowel sounds normal. Abdomen soft and non-tender.  Extremities: No peripheral edema. Warm/ well perfused.  Neuro: Cranial nerves grossly intact, ANO X  3  Laboratory: Recent Labs  Lab 10/30/18 0339 10/30/18 1544 10/31/18 0246  WBC 6.7 8.6 8.5  HGB 7.7* 7.9* 7.3*  HCT 25.8* 26.8* 24.8*  PLT 232 218 207   Recent Labs  Lab 10/28/18 0211  10/30/18 0339 10/30/18 1544 10/31/18 0246  NA 139   < > 139 140 139  K 4.2   < > 3.6 3.8 3.6  CL 104   < > 107 109 109  CO2 25   < > 23 21* 23  BUN 24*   < > '11 11 12  ' CREATININE 1.11*   < > 1.02* 1.02* 0.92  CALCIUM 8.7*   < > 8.6* 8.2* 8.1*  PROT 6.4*  --   --  5.9*  --   BILITOT 1.3*  --   --  0.9  --   ALKPHOS 97  --   --  81  --   ALT 11  --   --  11  --   AST 13*  --   --  14*  --   GLUCOSE 108*   < > 115* 146* 92   < > = values in this interval not displayed.      Imaging/Diagnostic Tests: Ir Angiogram Visceral Selective  Result Date: 10/30/2018 INDICATION: Recent endoscopy with active extravasation from duodenal polyp with subsequent CTA demonstrating focal ectasia of the distal trifurcation of the GDA. Request made for mesenteric arteriogram and potential percutaneous coil embolization. EXAM: 1. ULTRASOUND GUIDANCE FOR ARTERIAL ACCESS 2. CELIAC ARTERIOGRAM (1st ORDER) 3. SELECTIVE GASTRO EPIPLOIC ARTERIOGRAM AND PERCUTANEOUS COIL EMBOLIZATION. 4. SELECTIVE ANTERIOR AND POSTERIOR DIVISIONS OF THE PANCREATICODUODENAL ARTERY AND PERCUTANEOUS COIL EMBOLIZATION. 5. SELECTIVE GASTRODUODENAL ARTERIOGRAM AND PERCUTANEOUS COIL EMBOLIZATION COMPARISON:  CTA abdomen and pelvis - 10/29/2018 MEDICATIONS: None ANESTHESIA/SEDATION: Moderate (conscious) sedation was employed during this procedure. A total of Versed 1 mg and Fentanyl 50 mcg was administered intravenously. Moderate Sedation Time: 70 minutes. The patient's level of consciousness and vital signs were monitored continuously by radiology nursing throughout the procedure under my direct supervision. CONTRAST:  75 cc Isovue-300 FLUOROSCOPY TIME:  20 minutes, 36 seconds (2,423 mGy) COMPLICATIONS: None immediate. PROCEDURE: Informed consent was  obtained from the patient's daughter following explanation of the procedure, risks, benefits and alternatives. The patient understands, agrees and consents for the procedure. All questions were addressed. A time out was performed prior to the initiation of the procedure. Maximal barrier sterile technique utilized including caps, mask, sterile gowns, sterile gloves, large sterile drape, hand hygiene, and Betadine prep. The right femoral head was marked fluoroscopically. Under sterile conditions and local anesthesia, the right common femoral artery access was performed with a micropuncture needle. Under direct ultrasound guidance, the right common femoral was accessed with a micropuncture kit. An ultrasound image was saved for documentation purposes. This allowed for placement of a 5-French vascular sheath. A limited arteriogram was performed through the side arm of the sheath confirming appropriate access within the right common femoral artery. Mickelson catheter was advanced to the level of the thoracic aorta where was reformed, back bled and flushed. Mickelson catheter was then utilized to select the celiac artery and a selective celiac arteriogram was performed. With the use of a fathom 14 microwire, a regular Renegade microcatheter was advanced into the gastroduodenal artery and a selective gastroduodenal arteriogram was performed. Next,  the microcatheter was advanced into the posterior division of the pancreaticoduodenal artery. Selective arteriogram was performed and the vessel was percutaneously coil embolized to near its origin with multiple overlapping 4, 5 and 6 mm diameter interlock coils. Next, the microcatheter was advanced into the gastroepiploic artery and a selective gastroepiploic arteriogram was performed. The vessel within percutaneously coil embolized to near its origin with multiple overlapping 6 mm diameter interlock coils. During deployment of the final coil, there was difficulty removing the  pushing wire, ultimately resulting in damage to the microcatheter requiring replacement. With the use of a fathom 14 microwire, microcatheter was utilized to select the anterior division of the pancreaticoduodenal artery. Selective arteriogram was performed and the vessel was percutaneously coil embolized to near its origin with multiple overlapping 4 and 5 mm diameter interlock coils. Next, the microcatheter was retracted into the distal aspect of the GDA and a selective arteriogram was performed. The focally ectatic segment of the distal aspect of the GDA and it's distal aspect was then percutaneously coil embolized with multiple overlapping 8 mm and 6 mm diameter interlock coils to the level of its more proximal branch division. The microcatheter was retracted into the proximal GDA as well as the common hepatic artery, and completion arteriograms were performed Images were reviewed and the procedure was terminated. At this point, all wires, catheters and sheaths were removed from the patient. Hemostasis was achieved at the right groin access site with deployment of an ExoSeal closure device and manual compression. The patient tolerated the procedure well without immediate post procedural complication. FINDINGS: Selective celiac arteriogram demonstrates conventional takeoff of the GDA. Redemonstrated focally ectatic segment of the distal aspect of the GDA at the level of its trifurcation as was seen on preceding CTA. Note is again made of additional branch of the GDA at its mid aspect however this vessel does not contribute to the ectatic portion of the more distal aspect of the GDA. Following selective arteriogram and percutaneous coil embolization of the gastroepiploic, the anterior and posterior divisions of the pancreaticoduodenal artery and the distal aspect of the GDA, there is complete occlusion of the ectatic segment of the GDA without persistent flow. The more proximal branch of the GDA, not contributing  to the distal ectatic segment, remains patent. IMPRESSION: Technically successful percutaneous coil embolization of ectatic segment of the distal GDA at the level of its trifurcation. PLAN: - Continued conservative management with daily CBCs as per the primary team. - Repeat endoscopy may be performed at the discretion GI service as clinically indicated. Electronically Signed   By: Sandi Mariscal M.D.   On: 10/30/2018 11:54   Ir Angiogram Visceral Selective  Result Date: 10/30/2018 INDICATION: Recent endoscopy with active extravasation from duodenal polyp with subsequent CTA demonstrating focal ectasia of the distal trifurcation of the GDA. Request made for mesenteric arteriogram and potential percutaneous coil embolization. EXAM: 1. ULTRASOUND GUIDANCE FOR ARTERIAL ACCESS 2. CELIAC ARTERIOGRAM (1st ORDER) 3. SELECTIVE GASTRO EPIPLOIC ARTERIOGRAM AND PERCUTANEOUS COIL EMBOLIZATION. 4. SELECTIVE ANTERIOR AND POSTERIOR DIVISIONS OF THE PANCREATICODUODENAL ARTERY AND PERCUTANEOUS COIL EMBOLIZATION. 5. SELECTIVE GASTRODUODENAL ARTERIOGRAM AND PERCUTANEOUS COIL EMBOLIZATION COMPARISON:  CTA abdomen and pelvis - 10/29/2018 MEDICATIONS: None ANESTHESIA/SEDATION: Moderate (conscious) sedation was employed during this procedure. A total of Versed 1 mg and Fentanyl 50 mcg was administered intravenously. Moderate Sedation Time: 70 minutes. The patient's level of consciousness and vital signs were monitored continuously by radiology nursing throughout the procedure under my direct supervision. CONTRAST:  75 cc Isovue-300 FLUOROSCOPY  TIME:  20 minutes, 36 seconds (2,878 mGy) COMPLICATIONS: None immediate. PROCEDURE: Informed consent was obtained from the patient's daughter following explanation of the procedure, risks, benefits and alternatives. The patient understands, agrees and consents for the procedure. All questions were addressed. A time out was performed prior to the initiation of the procedure. Maximal barrier sterile  technique utilized including caps, mask, sterile gowns, sterile gloves, large sterile drape, hand hygiene, and Betadine prep. The right femoral head was marked fluoroscopically. Under sterile conditions and local anesthesia, the right common femoral artery access was performed with a micropuncture needle. Under direct ultrasound guidance, the right common femoral was accessed with a micropuncture kit. An ultrasound image was saved for documentation purposes. This allowed for placement of a 5-French vascular sheath. A limited arteriogram was performed through the side arm of the sheath confirming appropriate access within the right common femoral artery. Mickelson catheter was advanced to the level of the thoracic aorta where was reformed, back bled and flushed. Mickelson catheter was then utilized to select the celiac artery and a selective celiac arteriogram was performed. With the use of a fathom 14 microwire, a regular Renegade microcatheter was advanced into the gastroduodenal artery and a selective gastroduodenal arteriogram was performed. Next, the microcatheter was advanced into the posterior division of the pancreaticoduodenal artery. Selective arteriogram was performed and the vessel was percutaneously coil embolized to near its origin with multiple overlapping 4, 5 and 6 mm diameter interlock coils. Next, the microcatheter was advanced into the gastroepiploic artery and a selective gastroepiploic arteriogram was performed. The vessel within percutaneously coil embolized to near its origin with multiple overlapping 6 mm diameter interlock coils. During deployment of the final coil, there was difficulty removing the pushing wire, ultimately resulting in damage to the microcatheter requiring replacement. With the use of a fathom 14 microwire, microcatheter was utilized to select the anterior division of the pancreaticoduodenal artery. Selective arteriogram was performed and the vessel was percutaneously coil  embolized to near its origin with multiple overlapping 4 and 5 mm diameter interlock coils. Next, the microcatheter was retracted into the distal aspect of the GDA and a selective arteriogram was performed. The focally ectatic segment of the distal aspect of the GDA and it's distal aspect was then percutaneously coil embolized with multiple overlapping 8 mm and 6 mm diameter interlock coils to the level of its more proximal branch division. The microcatheter was retracted into the proximal GDA as well as the common hepatic artery, and completion arteriograms were performed Images were reviewed and the procedure was terminated. At this point, all wires, catheters and sheaths were removed from the patient. Hemostasis was achieved at the right groin access site with deployment of an ExoSeal closure device and manual compression. The patient tolerated the procedure well without immediate post procedural complication. FINDINGS: Selective celiac arteriogram demonstrates conventional takeoff of the GDA. Redemonstrated focally ectatic segment of the distal aspect of the GDA at the level of its trifurcation as was seen on preceding CTA. Note is again made of additional branch of the GDA at its mid aspect however this vessel does not contribute to the ectatic portion of the more distal aspect of the GDA. Following selective arteriogram and percutaneous coil embolization of the gastroepiploic, the anterior and posterior divisions of the pancreaticoduodenal artery and the distal aspect of the GDA, there is complete occlusion of the ectatic segment of the GDA without persistent flow. The more proximal branch of the GDA, not contributing to the distal ectatic  segment, remains patent. IMPRESSION: Technically successful percutaneous coil embolization of ectatic segment of the distal GDA at the level of its trifurcation. PLAN: - Continued conservative management with daily CBCs as per the primary team. - Repeat endoscopy may be  performed at the discretion GI service as clinically indicated. Electronically Signed   By: Sandi Mariscal M.D.   On: 10/30/2018 11:54   Ir Angiogram Selective Each Additional Vessel  Result Date: 10/30/2018 INDICATION: Recent endoscopy with active extravasation from duodenal polyp with subsequent CTA demonstrating focal ectasia of the distal trifurcation of the GDA. Request made for mesenteric arteriogram and potential percutaneous coil embolization. EXAM: 1. ULTRASOUND GUIDANCE FOR ARTERIAL ACCESS 2. CELIAC ARTERIOGRAM (1st ORDER) 3. SELECTIVE GASTRO EPIPLOIC ARTERIOGRAM AND PERCUTANEOUS COIL EMBOLIZATION. 4. SELECTIVE ANTERIOR AND POSTERIOR DIVISIONS OF THE PANCREATICODUODENAL ARTERY AND PERCUTANEOUS COIL EMBOLIZATION. 5. SELECTIVE GASTRODUODENAL ARTERIOGRAM AND PERCUTANEOUS COIL EMBOLIZATION COMPARISON:  CTA abdomen and pelvis - 10/29/2018 MEDICATIONS: None ANESTHESIA/SEDATION: Moderate (conscious) sedation was employed during this procedure. A total of Versed 1 mg and Fentanyl 50 mcg was administered intravenously. Moderate Sedation Time: 70 minutes. The patient's level of consciousness and vital signs were monitored continuously by radiology nursing throughout the procedure under my direct supervision. CONTRAST:  75 cc Isovue-300 FLUOROSCOPY TIME:  20 minutes, 36 seconds (2,505 mGy) COMPLICATIONS: None immediate. PROCEDURE: Informed consent was obtained from the patient's daughter following explanation of the procedure, risks, benefits and alternatives. The patient understands, agrees and consents for the procedure. All questions were addressed. A time out was performed prior to the initiation of the procedure. Maximal barrier sterile technique utilized including caps, mask, sterile gowns, sterile gloves, large sterile drape, hand hygiene, and Betadine prep. The right femoral head was marked fluoroscopically. Under sterile conditions and local anesthesia, the right common femoral artery access was performed  with a micropuncture needle. Under direct ultrasound guidance, the right common femoral was accessed with a micropuncture kit. An ultrasound image was saved for documentation purposes. This allowed for placement of a 5-French vascular sheath. A limited arteriogram was performed through the side arm of the sheath confirming appropriate access within the right common femoral artery. Mickelson catheter was advanced to the level of the thoracic aorta where was reformed, back bled and flushed. Mickelson catheter was then utilized to select the celiac artery and a selective celiac arteriogram was performed. With the use of a fathom 14 microwire, a regular Renegade microcatheter was advanced into the gastroduodenal artery and a selective gastroduodenal arteriogram was performed. Next, the microcatheter was advanced into the posterior division of the pancreaticoduodenal artery. Selective arteriogram was performed and the vessel was percutaneously coil embolized to near its origin with multiple overlapping 4, 5 and 6 mm diameter interlock coils. Next, the microcatheter was advanced into the gastroepiploic artery and a selective gastroepiploic arteriogram was performed. The vessel within percutaneously coil embolized to near its origin with multiple overlapping 6 mm diameter interlock coils. During deployment of the final coil, there was difficulty removing the pushing wire, ultimately resulting in damage to the microcatheter requiring replacement. With the use of a fathom 14 microwire, microcatheter was utilized to select the anterior division of the pancreaticoduodenal artery. Selective arteriogram was performed and the vessel was percutaneously coil embolized to near its origin with multiple overlapping 4 and 5 mm diameter interlock coils. Next, the microcatheter was retracted into the distal aspect of the GDA and a selective arteriogram was performed. The focally ectatic segment of the distal aspect of the GDA and it's  distal  aspect was then percutaneously coil embolized with multiple overlapping 8 mm and 6 mm diameter interlock coils to the level of its more proximal branch division. The microcatheter was retracted into the proximal GDA as well as the common hepatic artery, and completion arteriograms were performed Images were reviewed and the procedure was terminated. At this point, all wires, catheters and sheaths were removed from the patient. Hemostasis was achieved at the right groin access site with deployment of an ExoSeal closure device and manual compression. The patient tolerated the procedure well without immediate post procedural complication. FINDINGS: Selective celiac arteriogram demonstrates conventional takeoff of the GDA. Redemonstrated focally ectatic segment of the distal aspect of the GDA at the level of its trifurcation as was seen on preceding CTA. Note is again made of additional branch of the GDA at its mid aspect however this vessel does not contribute to the ectatic portion of the more distal aspect of the GDA. Following selective arteriogram and percutaneous coil embolization of the gastroepiploic, the anterior and posterior divisions of the pancreaticoduodenal artery and the distal aspect of the GDA, there is complete occlusion of the ectatic segment of the GDA without persistent flow. The more proximal branch of the GDA, not contributing to the distal ectatic segment, remains patent. IMPRESSION: Technically successful percutaneous coil embolization of ectatic segment of the distal GDA at the level of its trifurcation. PLAN: - Continued conservative management with daily CBCs as per the primary team. - Repeat endoscopy may be performed at the discretion GI service as clinically indicated. Electronically Signed   By: Sandi Mariscal M.D.   On: 10/30/2018 11:54   Ir Angiogram Selective Each Additional Vessel  Result Date: 10/30/2018 INDICATION: Recent endoscopy with active extravasation from duodenal  polyp with subsequent CTA demonstrating focal ectasia of the distal trifurcation of the GDA. Request made for mesenteric arteriogram and potential percutaneous coil embolization. EXAM: 1. ULTRASOUND GUIDANCE FOR ARTERIAL ACCESS 2. CELIAC ARTERIOGRAM (1st ORDER) 3. SELECTIVE GASTRO EPIPLOIC ARTERIOGRAM AND PERCUTANEOUS COIL EMBOLIZATION. 4. SELECTIVE ANTERIOR AND POSTERIOR DIVISIONS OF THE PANCREATICODUODENAL ARTERY AND PERCUTANEOUS COIL EMBOLIZATION. 5. SELECTIVE GASTRODUODENAL ARTERIOGRAM AND PERCUTANEOUS COIL EMBOLIZATION COMPARISON:  CTA abdomen and pelvis - 10/29/2018 MEDICATIONS: None ANESTHESIA/SEDATION: Moderate (conscious) sedation was employed during this procedure. A total of Versed 1 mg and Fentanyl 50 mcg was administered intravenously. Moderate Sedation Time: 70 minutes. The patient's level of consciousness and vital signs were monitored continuously by radiology nursing throughout the procedure under my direct supervision. CONTRAST:  75 cc Isovue-300 FLUOROSCOPY TIME:  20 minutes, 36 seconds (1,017 mGy) COMPLICATIONS: None immediate. PROCEDURE: Informed consent was obtained from the patient's daughter following explanation of the procedure, risks, benefits and alternatives. The patient understands, agrees and consents for the procedure. All questions were addressed. A time out was performed prior to the initiation of the procedure. Maximal barrier sterile technique utilized including caps, mask, sterile gowns, sterile gloves, large sterile drape, hand hygiene, and Betadine prep. The right femoral head was marked fluoroscopically. Under sterile conditions and local anesthesia, the right common femoral artery access was performed with a micropuncture needle. Under direct ultrasound guidance, the right common femoral was accessed with a micropuncture kit. An ultrasound image was saved for documentation purposes. This allowed for placement of a 5-French vascular sheath. A limited arteriogram was performed  through the side arm of the sheath confirming appropriate access within the right common femoral artery. Mickelson catheter was advanced to the level of the thoracic aorta where was reformed, back bled and flushed.  Mickelson catheter was then utilized to select the celiac artery and a selective celiac arteriogram was performed. With the use of a fathom 14 microwire, a regular Renegade microcatheter was advanced into the gastroduodenal artery and a selective gastroduodenal arteriogram was performed. Next, the microcatheter was advanced into the posterior division of the pancreaticoduodenal artery. Selective arteriogram was performed and the vessel was percutaneously coil embolized to near its origin with multiple overlapping 4, 5 and 6 mm diameter interlock coils. Next, the microcatheter was advanced into the gastroepiploic artery and a selective gastroepiploic arteriogram was performed. The vessel within percutaneously coil embolized to near its origin with multiple overlapping 6 mm diameter interlock coils. During deployment of the final coil, there was difficulty removing the pushing wire, ultimately resulting in damage to the microcatheter requiring replacement. With the use of a fathom 14 microwire, microcatheter was utilized to select the anterior division of the pancreaticoduodenal artery. Selective arteriogram was performed and the vessel was percutaneously coil embolized to near its origin with multiple overlapping 4 and 5 mm diameter interlock coils. Next, the microcatheter was retracted into the distal aspect of the GDA and a selective arteriogram was performed. The focally ectatic segment of the distal aspect of the GDA and it's distal aspect was then percutaneously coil embolized with multiple overlapping 8 mm and 6 mm diameter interlock coils to the level of its more proximal branch division. The microcatheter was retracted into the proximal GDA as well as the common hepatic artery, and completion  arteriograms were performed Images were reviewed and the procedure was terminated. At this point, all wires, catheters and sheaths were removed from the patient. Hemostasis was achieved at the right groin access site with deployment of an ExoSeal closure device and manual compression. The patient tolerated the procedure well without immediate post procedural complication. FINDINGS: Selective celiac arteriogram demonstrates conventional takeoff of the GDA. Redemonstrated focally ectatic segment of the distal aspect of the GDA at the level of its trifurcation as was seen on preceding CTA. Note is again made of additional branch of the GDA at its mid aspect however this vessel does not contribute to the ectatic portion of the more distal aspect of the GDA. Following selective arteriogram and percutaneous coil embolization of the gastroepiploic, the anterior and posterior divisions of the pancreaticoduodenal artery and the distal aspect of the GDA, there is complete occlusion of the ectatic segment of the GDA without persistent flow. The more proximal branch of the GDA, not contributing to the distal ectatic segment, remains patent. IMPRESSION: Technically successful percutaneous coil embolization of ectatic segment of the distal GDA at the level of its trifurcation. PLAN: - Continued conservative management with daily CBCs as per the primary team. - Repeat endoscopy may be performed at the discretion GI service as clinically indicated. Electronically Signed   By: Sandi Mariscal M.D.   On: 10/30/2018 11:54   Ir Angiogram Selective Each Additional Vessel  Result Date: 10/30/2018 INDICATION: Recent endoscopy with active extravasation from duodenal polyp with subsequent CTA demonstrating focal ectasia of the distal trifurcation of the GDA. Request made for mesenteric arteriogram and potential percutaneous coil embolization. EXAM: 1. ULTRASOUND GUIDANCE FOR ARTERIAL ACCESS 2. CELIAC ARTERIOGRAM (1st ORDER) 3. SELECTIVE  GASTRO EPIPLOIC ARTERIOGRAM AND PERCUTANEOUS COIL EMBOLIZATION. 4. SELECTIVE ANTERIOR AND POSTERIOR DIVISIONS OF THE PANCREATICODUODENAL ARTERY AND PERCUTANEOUS COIL EMBOLIZATION. 5. SELECTIVE GASTRODUODENAL ARTERIOGRAM AND PERCUTANEOUS COIL EMBOLIZATION COMPARISON:  CTA abdomen and pelvis - 10/29/2018 MEDICATIONS: None ANESTHESIA/SEDATION: Moderate (conscious) sedation was employed during this procedure.  A total of Versed 1 mg and Fentanyl 50 mcg was administered intravenously. Moderate Sedation Time: 70 minutes. The patient's level of consciousness and vital signs were monitored continuously by radiology nursing throughout the procedure under my direct supervision. CONTRAST:  75 cc Isovue-300 FLUOROSCOPY TIME:  20 minutes, 36 seconds (6,659 mGy) COMPLICATIONS: None immediate. PROCEDURE: Informed consent was obtained from the patient's daughter following explanation of the procedure, risks, benefits and alternatives. The patient understands, agrees and consents for the procedure. All questions were addressed. A time out was performed prior to the initiation of the procedure. Maximal barrier sterile technique utilized including caps, mask, sterile gowns, sterile gloves, large sterile drape, hand hygiene, and Betadine prep. The right femoral head was marked fluoroscopically. Under sterile conditions and local anesthesia, the right common femoral artery access was performed with a micropuncture needle. Under direct ultrasound guidance, the right common femoral was accessed with a micropuncture kit. An ultrasound image was saved for documentation purposes. This allowed for placement of a 5-French vascular sheath. A limited arteriogram was performed through the side arm of the sheath confirming appropriate access within the right common femoral artery. Mickelson catheter was advanced to the level of the thoracic aorta where was reformed, back bled and flushed. Mickelson catheter was then utilized to select the celiac  artery and a selective celiac arteriogram was performed. With the use of a fathom 14 microwire, a regular Renegade microcatheter was advanced into the gastroduodenal artery and a selective gastroduodenal arteriogram was performed. Next, the microcatheter was advanced into the posterior division of the pancreaticoduodenal artery. Selective arteriogram was performed and the vessel was percutaneously coil embolized to near its origin with multiple overlapping 4, 5 and 6 mm diameter interlock coils. Next, the microcatheter was advanced into the gastroepiploic artery and a selective gastroepiploic arteriogram was performed. The vessel within percutaneously coil embolized to near its origin with multiple overlapping 6 mm diameter interlock coils. During deployment of the final coil, there was difficulty removing the pushing wire, ultimately resulting in damage to the microcatheter requiring replacement. With the use of a fathom 14 microwire, microcatheter was utilized to select the anterior division of the pancreaticoduodenal artery. Selective arteriogram was performed and the vessel was percutaneously coil embolized to near its origin with multiple overlapping 4 and 5 mm diameter interlock coils. Next, the microcatheter was retracted into the distal aspect of the GDA and a selective arteriogram was performed. The focally ectatic segment of the distal aspect of the GDA and it's distal aspect was then percutaneously coil embolized with multiple overlapping 8 mm and 6 mm diameter interlock coils to the level of its more proximal branch division. The microcatheter was retracted into the proximal GDA as well as the common hepatic artery, and completion arteriograms were performed Images were reviewed and the procedure was terminated. At this point, all wires, catheters and sheaths were removed from the patient. Hemostasis was achieved at the right groin access site with deployment of an ExoSeal closure device and manual  compression. The patient tolerated the procedure well without immediate post procedural complication. FINDINGS: Selective celiac arteriogram demonstrates conventional takeoff of the GDA. Redemonstrated focally ectatic segment of the distal aspect of the GDA at the level of its trifurcation as was seen on preceding CTA. Note is again made of additional branch of the GDA at its mid aspect however this vessel does not contribute to the ectatic portion of the more distal aspect of the GDA. Following selective arteriogram and percutaneous coil embolization of  the gastroepiploic, the anterior and posterior divisions of the pancreaticoduodenal artery and the distal aspect of the GDA, there is complete occlusion of the ectatic segment of the GDA without persistent flow. The more proximal branch of the GDA, not contributing to the distal ectatic segment, remains patent. IMPRESSION: Technically successful percutaneous coil embolization of ectatic segment of the distal GDA at the level of its trifurcation. PLAN: - Continued conservative management with daily CBCs as per the primary team. - Repeat endoscopy may be performed at the discretion GI service as clinically indicated. Electronically Signed   By: Sandi Mariscal M.D.   On: 10/30/2018 11:54   Ir US Guidance  Result Date: 10/30/2018 INDICATION: Recent endoscopy with active extravasation from duodenal polyp with subsequent CTA demonstrating focal ectasia of the distal trifurcation of the GDA. Request made for mesenteric arteriogram and potential percutaneous coil embolization. EXAM: 1. ULTRASOUND GUIDANCE FOR ARTERIAL ACCESS 2. CELIAC ARTERIOGRAM (1st ORDER) 3. SELECTIVE GASTRO EPIPLOIC ARTERIOGRAM AND PERCUTANEOUS COIL EMBOLIZATION. 4. SELECTIVE ANTERIOR AND POSTERIOR DIVISIONS OF THE PANCREATICODUODENAL ARTERY AND PERCUTANEOUS COIL EMBOLIZATION. 5. SELECTIVE GASTRODUODENAL ARTERIOGRAM AND PERCUTANEOUS COIL EMBOLIZATION COMPARISON:  CTA abdomen and pelvis - 10/29/2018  MEDICATIONS: None ANESTHESIA/SEDATION: Moderate (conscious) sedation was employed during this procedure. A total of Versed 1 mg and Fentanyl 50 mcg was administered intravenously. Moderate Sedation Time: 70 minutes. The patient's level of consciousness and vital signs were monitored continuously by radiology nursing throughout the procedure under my direct supervision. CONTRAST:  75 cc Isovue-300 FLUOROSCOPY TIME:  20 minutes, 36 seconds (1,749 mGy) COMPLICATIONS: None immediate. PROCEDURE: Informed consent was obtained from the patient's daughter following explanation of the procedure, risks, benefits and alternatives. The patient understands, agrees and consents for the procedure. All questions were addressed. A time out was performed prior to the initiation of the procedure. Maximal barrier sterile technique utilized including caps, mask, sterile gowns, sterile gloves, large sterile drape, hand hygiene, and Betadine prep. The right femoral head was marked fluoroscopically. Under sterile conditions and local anesthesia, the right common femoral artery access was performed with a micropuncture needle. Under direct ultrasound guidance, the right common femoral was accessed with a micropuncture kit. An ultrasound image was saved for documentation purposes. This allowed for placement of a 5-French vascular sheath. A limited arteriogram was performed through the side arm of the sheath confirming appropriate access within the right common femoral artery. Mickelson catheter was advanced to the level of the thoracic aorta where was reformed, back bled and flushed. Mickelson catheter was then utilized to select the celiac artery and a selective celiac arteriogram was performed. With the use of a fathom 14 microwire, a regular Renegade microcatheter was advanced into the gastroduodenal artery and a selective gastroduodenal arteriogram was performed. Next, the microcatheter was advanced into the posterior division of the  pancreaticoduodenal artery. Selective arteriogram was performed and the vessel was percutaneously coil embolized to near its origin with multiple overlapping 4, 5 and 6 mm diameter interlock coils. Next, the microcatheter was advanced into the gastroepiploic artery and a selective gastroepiploic arteriogram was performed. The vessel within percutaneously coil embolized to near its origin with multiple overlapping 6 mm diameter interlock coils. During deployment of the final coil, there was difficulty removing the pushing wire, ultimately resulting in damage to the microcatheter requiring replacement. With the use of a fathom 14 microwire, microcatheter was utilized to select the anterior division of the pancreaticoduodenal artery. Selective arteriogram was performed and the vessel was percutaneously coil embolized to near its origin with  multiple overlapping 4 and 5 mm diameter interlock coils. Next, the microcatheter was retracted into the distal aspect of the GDA and a selective arteriogram was performed. The focally ectatic segment of the distal aspect of the GDA and it's distal aspect was then percutaneously coil embolized with multiple overlapping 8 mm and 6 mm diameter interlock coils to the level of its more proximal branch division. The microcatheter was retracted into the proximal GDA as well as the common hepatic artery, and completion arteriograms were performed Images were reviewed and the procedure was terminated. At this point, all wires, catheters and sheaths were removed from the patient. Hemostasis was achieved at the right groin access site with deployment of an ExoSeal closure device and manual compression. The patient tolerated the procedure well without immediate post procedural complication. FINDINGS: Selective celiac arteriogram demonstrates conventional takeoff of the GDA. Redemonstrated focally ectatic segment of the distal aspect of the GDA at the level of its trifurcation as was seen on  preceding CTA. Note is again made of additional branch of the GDA at its mid aspect however this vessel does not contribute to the ectatic portion of the more distal aspect of the GDA. Following selective arteriogram and percutaneous coil embolization of the gastroepiploic, the anterior and posterior divisions of the pancreaticoduodenal artery and the distal aspect of the GDA, there is complete occlusion of the ectatic segment of the GDA without persistent flow. The more proximal branch of the GDA, not contributing to the distal ectatic segment, remains patent. IMPRESSION: Technically successful percutaneous coil embolization of ectatic segment of the distal GDA at the level of its trifurcation. PLAN: - Continued conservative management with daily CBCs as per the primary team. - Repeat endoscopy may be performed at the discretion GI service as clinically indicated. Electronically Signed   By: Sandi Mariscal M.D.   On: 10/30/2018 11:54   Dg Chest Port 1 View  Result Date: 10/30/2018 CLINICAL DATA:  Chest pain and shortness of breath. EXAM: PORTABLE CHEST 1 VIEW COMPARISON:  Single-view of the chest 10/27/2018 and 06/14/2016. FINDINGS: Pulmonary edema appears somewhat worse than on the most recent comparison. Heart size is upper normal. No pneumothorax or pleural effusion. Aortic atherosclerosis noted. No acute bony abnormality. IMPRESSION: Pulmonary edema appears somewhat worse than on the most recent comparison. No other change. Atherosclerosis. Electronically Signed   By: Inge Rise M.D.   On: 10/30/2018 16:52   Ir Embo Art  [place] Guide Roadmapping  Result Date: 10/30/2018 INDICATION: Recent endoscopy with active extravasation from duodenal polyp with subsequent CTA demonstrating focal ectasia of the distal trifurcation of the GDA. Request made for mesenteric arteriogram and potential percutaneous coil embolization. EXAM: 1. ULTRASOUND GUIDANCE FOR ARTERIAL ACCESS 2. CELIAC  ARTERIOGRAM (1st ORDER) 3. SELECTIVE GASTRO EPIPLOIC ARTERIOGRAM AND PERCUTANEOUS COIL EMBOLIZATION. 4. SELECTIVE ANTERIOR AND POSTERIOR DIVISIONS OF THE PANCREATICODUODENAL ARTERY AND PERCUTANEOUS COIL EMBOLIZATION. 5. SELECTIVE GASTRODUODENAL ARTERIOGRAM AND PERCUTANEOUS COIL EMBOLIZATION COMPARISON:  CTA abdomen and pelvis - 10/29/2018 MEDICATIONS: None ANESTHESIA/SEDATION: Moderate (conscious) sedation was employed during this procedure. A total of Versed 1 mg and Fentanyl 50 mcg was administered intravenously. Moderate Sedation Time: 70 minutes. The patient's level of consciousness and vital signs were monitored continuously by radiology nursing throughout the procedure under my direct supervision. CONTRAST:  75 cc Isovue-300 FLUOROSCOPY TIME:  20 minutes, 36 seconds (6,203 mGy) COMPLICATIONS: None immediate. PROCEDURE: Informed consent was obtained from the patient's daughter following explanation of the procedure, risks, benefits and alternatives.  The patient understands, agrees and consents for the procedure. All questions were addressed. A time out was performed prior to the initiation of the procedure. Maximal barrier sterile technique utilized including caps, mask, sterile gowns, sterile gloves, large sterile drape, hand hygiene, and Betadine prep. The right femoral head was marked fluoroscopically. Under sterile conditions and local anesthesia, the right common femoral artery access was performed with a micropuncture needle. Under direct ultrasound guidance, the right common femoral was accessed with a micropuncture kit. An ultrasound image was saved for documentation purposes. This allowed for placement of a 5-French vascular sheath. A limited arteriogram was performed through the side arm of the sheath confirming appropriate access within the right common femoral artery. Mickelson catheter was advanced to the level of the thoracic aorta where was reformed, back bled and flushed. Mickelson catheter was  then utilized to select the celiac artery and a selective celiac arteriogram was performed. With the use of a fathom 14 microwire, a regular Renegade microcatheter was advanced into the gastroduodenal artery and a selective gastroduodenal arteriogram was performed. Next, the microcatheter was advanced into the posterior division of the pancreaticoduodenal artery. Selective arteriogram was performed and the vessel was percutaneously coil embolized to near its origin with multiple overlapping 4, 5 and 6 mm diameter interlock coils. Next, the microcatheter was advanced into the gastroepiploic artery and a selective gastroepiploic arteriogram was performed. The vessel within percutaneously coil embolized to near its origin with multiple overlapping 6 mm diameter interlock coils. During deployment of the final coil, there was difficulty removing the pushing wire, ultimately resulting in damage to the microcatheter requiring replacement. With the use of a fathom 14 microwire, microcatheter was utilized to select the anterior division of the pancreaticoduodenal artery. Selective arteriogram was performed and the vessel was percutaneously coil embolized to near its origin with multiple overlapping 4 and 5 mm diameter interlock coils. Next, the microcatheter was retracted into the distal aspect of the GDA and a selective arteriogram was performed. The focally ectatic segment of the distal aspect of the GDA and it's distal aspect was then percutaneously coil embolized with multiple overlapping 8 mm and 6 mm diameter interlock coils to the level of its more proximal branch division. The microcatheter was retracted into the proximal GDA as well as the common hepatic artery, and completion arteriograms were performed Images were reviewed and the procedure was terminated. At this point, all wires, catheters and sheaths were removed from the patient. Hemostasis was achieved at the right groin access site with deployment of an  ExoSeal closure device and manual compression. The patient tolerated the procedure well without immediate post procedural complication. FINDINGS: Selective celiac arteriogram demonstrates conventional takeoff of the GDA. Redemonstrated focally ectatic segment of the distal aspect of the GDA at the level of its trifurcation as was seen on preceding CTA. Note is again made of additional branch of the GDA at its mid aspect however this vessel does not contribute to the ectatic portion of the more distal aspect of the GDA. Following selective arteriogram and percutaneous coil embolization of the gastroepiploic, the anterior and posterior divisions of the pancreaticoduodenal artery and the distal aspect of the GDA, there is complete occlusion of the ectatic segment of the GDA without persistent flow. The more proximal branch of the GDA, not contributing to the distal ectatic segment, remains patent. IMPRESSION: Technically successful percutaneous coil embolization of ectatic segment of the distal GDA at the level of its trifurcation. PLAN: - Continued conservative management with daily  CBCs as per the primary team. - Repeat endoscopy may be performed at the discretion GI service as clinically indicated. Electronically Signed   By: Sandi Mariscal M.D.   On: 10/30/2018 11:54    Lattie Haw, MD 10/31/2018, 6:17 AM PGY-1, [place] [facility] pager: [phone], text pages welcome

## 2018-10-31 NOTE — Progress Notes (Signed)
Provided daughter [name] with update.

## 2018-10-31 NOTE — Progress Notes (Signed)
  Echocardiogram 2D Echocardiogram has been performed.  Hannah [name] 10/31/2018, 4:06 PM

## 2018-10-31 NOTE — Progress Notes (Signed)
[facility] PCP Spoke with patients daughter regarding current shortness of breath, anemia treatment and longer term dementia prognosis.  Daughter is understanding and agreeable to current course and wishes patient to remain full code  Appreciate the care of our inpt team  [employer]

## 2018-11-01 DIAGNOSIS — K922 Gastrointestinal hemorrhage, unspecified: Secondary | ICD-10-CM

## 2018-11-01 LAB — CBC
HCT: 24.7 % — ABNORMAL LOW (ref 36.0–46.0)
Hemoglobin: 7.3 g/dL — ABNORMAL LOW (ref 12.0–15.0)
MCH: 26.7 pg (ref 26.0–34.0)
MCHC: 29.6 g/dL — ABNORMAL LOW (ref 30.0–36.0)
MCV: 90.5 fL (ref 80.0–100.0)
Platelets: 221 10*3/uL (ref 150–400)
RBC: 2.73 MIL/uL — ABNORMAL LOW (ref 3.87–5.11)
RDW: 15.9 % — ABNORMAL HIGH (ref 11.5–15.5)
WBC: 9.4 10*3/uL (ref 4.0–10.5)
nRBC: 0 % (ref 0.0–0.2)

## 2018-11-01 LAB — BASIC METABOLIC PANEL
Anion gap: 10 (ref 5–15)
BUN: 18 mg/dL (ref 8–23)
CO2: 25 mmol/L (ref 22–32)
Calcium: 8.3 mg/dL — ABNORMAL LOW (ref 8.9–10.3)
Chloride: 103 mmol/L (ref 98–111)
Creatinine, Ser: 1.25 mg/dL — ABNORMAL HIGH (ref 0.44–1.00)
GFR calc Af Amer: 49 mL/min — ABNORMAL LOW (ref >60)
GFR calc non Af Amer: 42 mL/min — ABNORMAL LOW (ref >60)
Glucose, Bld: 126 mg/dL — ABNORMAL HIGH (ref 70–99)
Potassium: 3.5 mmol/L (ref 3.5–5.1)
Sodium: 138 mmol/L (ref 135–145)

## 2018-11-01 MED ORDER — FUROSEMIDE 10 MG/ML IJ SOLN
40.0000 mg | Freq: Once | INTRAMUSCULAR | Status: AC
  Administered 2018-11-01: 40 mg via INTRAVENOUS
  Filled 2018-11-01: qty 4

## 2018-11-01 NOTE — Progress Notes (Signed)
Patient id refusing to stay in bed and trying to leaving the unit.  Patient is refusing to keep her oxygen on despite being short of breath. Patient is pushing the sitter in the room.  Patient is disoriented to place and situation.  5 mg of Ativan was given but ineffective. Notified the MD.  Will continue to monitor the patient.

## 2018-11-01 NOTE — Progress Notes (Addendum)
FMTS Attending Daily Note:  Hannah Netters MD Personal pager:  [phone] FPTS Service Pager:  [phone]  I have seen and examined this patient and have reviewed their chart. I have discussed this patient with the resident. I agree with the resident's findings, assessment and care plan.  Additionally:  Diurese x1 more day hoepfully can remain off oxygen and discharge home tomorrow  Hannah Rio, MD 11/01/2018    11/01/2018  Family Medicine Teaching Service Daily Progress Note Intern Pager: [phone]  [name] Medical record number: [id number] Date of birth: 16-Nov-1945 Age: 73 y.o. Gender: female  Primary Care Provider: [name], MD Consultants: GI  Code Status: Full   Pt Overview and Major Events to Date:  Hannah [name] is a 73 y.o. female presenting with abdominal pain, vomiting and diarrheax 2 . Found to have Hb 6.9 on admission. PMH is significant for HTN, HLD,GERD, Arthritis,Anxiety, Anemia with previous blood transfusions  Assessment and Plan:  Symptomatic anemia, likely secondary to GI bleed Currently asymptomatic, FOBT negative  EGD (07/10) A single 7 mm sessile polypoid lesion with bleeding was found in the duodenal bulb with fresh blood oozing from the center.  A single angioectasia without bleeding was found in the duodenal bulb  EGD otherwise normal appearing  per GI report  Colonoscopy (07/10) Diverticulosis in the sigmoid colon  A 10 mm polypoid lesion was found in the proximal sigmoid colon-biopsies done.  Two 6-7 mm sessile polyps were found in the proximal ascending colon; polyps were removed     Per GI awaiting biopsy results  Percutaneous mesenteric arteriogram and percutaneous coil embolization 7/12 Embolization of pseudoaneurym found on CT Abdomen, pelvis scan Procedure went well, no complications    Hb 7.3 today (7.3 7/14) 2 s/p EGD/Colonoscopy.  GI would like to keep Hb over 7. GI signed off on  7/13, no interventions or GI work up planned. FU in 2 week with Hannah [name] -Monitor for active hematemesis or melena -Monitor CBC daily  -IV PPI -SCDs  Hypoxia ?CHF Echo 7/13Left ventricle has normal systolic function with an ejection fraction of 60-65%. The cavity size was normal. Left ventricular diastolic Doppler parameters are consistent with pseudonormalization. The right ventricle has normal systolic function.Left atrial size was moderately dilated. Mild to moderate MR. Mild calcification of the aortic valve Sats 98% room air CXR after rapid response 7/12: pulmonary edema likely from fluid administration -given 65m iv furosemide yesterday, consider increasing to 462mBID today -if needing fluids-with caution  Chronic HTN    BP today over last 24 hrs 13/47. .OMarland Kitchen carvedilol and lisinopril-hctz at home. -continue carvdeilol -restarted lisinopril only 7/13 -continue to monitor BP  Gout Currently asymptomatic. Per chart review, home meds include Colchicine and Allopurinol.  Per patient she does not take these. -Call daughter for updated med list -Monitor  GERD Currently asymptomatic. On omeprazole at home. -IV PPI recent GI bleed  Anxiety On sertraline daily and as needed Xanax at home -Continue home meds  FEN/GI: normal diet, IV PPI  PPx: SCDs  Disposition: awaiting medical condition to stabilse  Subjective:  Pt doing well. Feels very well today. Denies chest pain, dyspnea, nausea or vomiting. No new concerns   Objective: Temp:  [97.8 F (36.6 C)-98.4 F (36.9 C)] 98.4 F (36.9 C) (07/13 2103) Pulse Rate:  [77-79] 79 (07/13 2103) Resp:  [15-19] 19 (07/13 2103) BP: (135-144)/(47-62) 135/47 (07/13 2103) SpO2:  [98 %-100 %] 98 % (07/13 2103)   General: Alert  and cooperative and appears to be in no acute distress HEENT: Neck non-tender without lymphadenopathy, masses or thyromegaly Cardio: Normal S1 and S2, no S3 or S4. Rhythm is regular. No murmurs or rubs.   Pulm:  chest clear on auscultation, resolution of bilateral crackles from yesterday. Normal respiratory effort Abdomen: Bowel sounds normal. Abdomen soft and non-tender.  Extremities: No peripheral edema. Warm/ well perfused.  Strong radial pulse. Neuro: Cranial nerves grossly intact. ANO X 3  Laboratory: Recent Labs  Lab 10/30/18 1544 10/31/18 0246 11/01/18 0117  WBC 8.6 8.5 9.4  HGB 7.9* 7.3* 7.3*  HCT 26.8* 24.8* 24.7*  PLT 218 207 221   Recent Labs  Lab 10/28/18 0211  10/30/18 1544 10/31/18 0246 11/01/18 0117  NA 139   < > 140 139 138  K 4.2   < > 3.8 3.6 3.5  CL 104   < > 109 109 103  CO2 25   < > 21* 23 25  BUN 24*   < > '11 12 18  ' CREATININE 1.11*   < > 1.02* 0.92 1.25*  CALCIUM 8.7*   < > 8.2* 8.1* 8.3*  PROT 6.4*  --  5.9*  --   --   BILITOT 1.3*  --  0.9  --   --   ALKPHOS 97  --  81  --   --   ALT 11  --  11  --   --   AST 13*  --  14*  --   --   GLUCOSE 108*   < > 146* 92 126*   < > = values in this interval not displayed.      Imaging/Diagnostic Tests: Ir Angiogram Visceral Selective  Result Date: 10/30/2018 INDICATION: Recent endoscopy with active extravasation from duodenal polyp with subsequent CTA demonstrating focal ectasia of the distal trifurcation of the GDA. Request made for mesenteric arteriogram and potential percutaneous coil embolization. EXAM: 1. ULTRASOUND GUIDANCE FOR ARTERIAL ACCESS 2. CELIAC ARTERIOGRAM (1st ORDER) 3. SELECTIVE GASTRO EPIPLOIC ARTERIOGRAM AND PERCUTANEOUS COIL EMBOLIZATION. 4. SELECTIVE ANTERIOR AND POSTERIOR DIVISIONS OF THE PANCREATICODUODENAL ARTERY AND PERCUTANEOUS COIL EMBOLIZATION. 5. SELECTIVE GASTRODUODENAL ARTERIOGRAM AND PERCUTANEOUS COIL EMBOLIZATION COMPARISON:  CTA abdomen and pelvis - 10/29/2018 MEDICATIONS: None ANESTHESIA/SEDATION: Moderate (conscious) sedation was employed during this procedure. A total of Versed 1 mg and Fentanyl 50 mcg was administered intravenously. Moderate Sedation Time: 70 minutes. The patient's  level of consciousness and vital signs were monitored continuously by radiology nursing throughout the procedure under my direct supervision. CONTRAST:  75 cc Isovue-300 FLUOROSCOPY TIME:  20 minutes, 36 seconds (9,326 mGy) COMPLICATIONS: None immediate. PROCEDURE: Informed consent was obtained from the patient's daughter following explanation of the procedure, risks, benefits and alternatives. The patient understands, agrees and consents for the procedure. All questions were addressed. A time out was performed prior to the initiation of the procedure. Maximal barrier sterile technique utilized including caps, mask, sterile gowns, sterile gloves, large sterile drape, hand hygiene, and Betadine prep. The right femoral head was marked fluoroscopically. Under sterile conditions and local anesthesia, the right common femoral artery access was performed with a micropuncture needle. Under direct ultrasound guidance, the right common femoral was accessed with a micropuncture kit. An ultrasound image was saved for documentation purposes. This allowed for placement of a 5-French vascular sheath. A limited arteriogram was performed through the side arm of the sheath confirming appropriate access within the right common femoral artery. Mickelson catheter was advanced to the level of the thoracic aorta where  was reformed, back bled and flushed. Mickelson catheter was then utilized to select the celiac artery and a selective celiac arteriogram was performed. With the use of a fathom 14 microwire, a regular Renegade microcatheter was advanced into the gastroduodenal artery and a selective gastroduodenal arteriogram was performed. Next, the microcatheter was advanced into the posterior division of the pancreaticoduodenal artery. Selective arteriogram was performed and the vessel was percutaneously coil embolized to near its origin with multiple overlapping 4, 5 and 6 mm diameter interlock coils. Next, the microcatheter was  advanced into the gastroepiploic artery and a selective gastroepiploic arteriogram was performed. The vessel within percutaneously coil embolized to near its origin with multiple overlapping 6 mm diameter interlock coils. During deployment of the final coil, there was difficulty removing the pushing wire, ultimately resulting in damage to the microcatheter requiring replacement. With the use of a fathom 14 microwire, microcatheter was utilized to select the anterior division of the pancreaticoduodenal artery. Selective arteriogram was performed and the vessel was percutaneously coil embolized to near its origin with multiple overlapping 4 and 5 mm diameter interlock coils. Next, the microcatheter was retracted into the distal aspect of the GDA and a selective arteriogram was performed. The focally ectatic segment of the distal aspect of the GDA and it's distal aspect was then percutaneously coil embolized with multiple overlapping 8 mm and 6 mm diameter interlock coils to the level of its more proximal branch division. The microcatheter was retracted into the proximal GDA as well as the common hepatic artery, and completion arteriograms were performed Images were reviewed and the procedure was terminated. At this point, all wires, catheters and sheaths were removed from the patient. Hemostasis was achieved at the right groin access site with deployment of an ExoSeal closure device and manual compression. The patient tolerated the procedure well without immediate post procedural complication. FINDINGS: Selective celiac arteriogram demonstrates conventional takeoff of the GDA. Redemonstrated focally ectatic segment of the distal aspect of the GDA at the level of its trifurcation as was seen on preceding CTA. Note is again made of additional branch of the GDA at its mid aspect however this vessel does not contribute to the ectatic portion of the more distal aspect of the GDA. Following selective arteriogram and  percutaneous coil embolization of the gastroepiploic, the anterior and posterior divisions of the pancreaticoduodenal artery and the distal aspect of the GDA, there is complete occlusion of the ectatic segment of the GDA without persistent flow. The more proximal branch of the GDA, not contributing to the distal ectatic segment, remains patent. IMPRESSION: Technically successful percutaneous coil embolization of ectatic segment of the distal GDA at the level of its trifurcation. PLAN: - Continued conservative management with daily CBCs as per the primary team. - Repeat endoscopy may be performed at the discretion GI service as clinically indicated. Electronically Signed   By: Sandi Mariscal M.D.   On: 10/30/2018 11:54   Ir Angiogram Selective Each Additional Vessel  Result Date: 10/30/2018 INDICATION: Recent endoscopy with active extravasation from duodenal polyp with subsequent CTA demonstrating focal ectasia of the distal trifurcation of the GDA. Request made for mesenteric arteriogram and potential percutaneous coil embolization. EXAM: 1. ULTRASOUND GUIDANCE FOR ARTERIAL ACCESS 2. CELIAC ARTERIOGRAM (1st ORDER) 3. SELECTIVE GASTRO EPIPLOIC ARTERIOGRAM AND PERCUTANEOUS COIL EMBOLIZATION. 4. SELECTIVE ANTERIOR AND POSTERIOR DIVISIONS OF THE PANCREATICODUODENAL ARTERY AND PERCUTANEOUS COIL EMBOLIZATION. 5. SELECTIVE GASTRODUODENAL ARTERIOGRAM AND PERCUTANEOUS COIL EMBOLIZATION COMPARISON:  CTA abdomen and pelvis - 10/29/2018 MEDICATIONS: None ANESTHESIA/SEDATION: Moderate (conscious)  sedation was employed during this procedure. A total of Versed 1 mg and Fentanyl 50 mcg was administered intravenously. Moderate Sedation Time: 70 minutes. The patient's level of consciousness and vital signs were monitored continuously by radiology nursing throughout the procedure under my direct supervision. CONTRAST:  75 cc Isovue-300 FLUOROSCOPY TIME:  20 minutes, 36 seconds (8,250 mGy) COMPLICATIONS: None immediate. PROCEDURE:  Informed consent was obtained from the patient's daughter following explanation of the procedure, risks, benefits and alternatives. The patient understands, agrees and consents for the procedure. All questions were addressed. A time out was performed prior to the initiation of the procedure. Maximal barrier sterile technique utilized including caps, mask, sterile gowns, sterile gloves, large sterile drape, hand hygiene, and Betadine prep. The right femoral head was marked fluoroscopically. Under sterile conditions and local anesthesia, the right common femoral artery access was performed with a micropuncture needle. Under direct ultrasound guidance, the right common femoral was accessed with a micropuncture kit. An ultrasound image was saved for documentation purposes. This allowed for placement of a 5-French vascular sheath. A limited arteriogram was performed through the side arm of the sheath confirming appropriate access within the right common femoral artery. Mickelson catheter was advanced to the level of the thoracic aorta where was reformed, back bled and flushed. Mickelson catheter was then utilized to select the celiac artery and a selective celiac arteriogram was performed. With the use of a fathom 14 microwire, a regular Renegade microcatheter was advanced into the gastroduodenal artery and a selective gastroduodenal arteriogram was performed. Next, the microcatheter was advanced into the posterior division of the pancreaticoduodenal artery. Selective arteriogram was performed and the vessel was percutaneously coil embolized to near its origin with multiple overlapping 4, 5 and 6 mm diameter interlock coils. Next, the microcatheter was advanced into the gastroepiploic artery and a selective gastroepiploic arteriogram was performed. The vessel within percutaneously coil embolized to near its origin with multiple overlapping 6 mm diameter interlock coils. During deployment of the final coil, there was  difficulty removing the pushing wire, ultimately resulting in damage to the microcatheter requiring replacement. With the use of a fathom 14 microwire, microcatheter was utilized to select the anterior division of the pancreaticoduodenal artery. Selective arteriogram was performed and the vessel was percutaneously coil embolized to near its origin with multiple overlapping 4 and 5 mm diameter interlock coils. Next, the microcatheter was retracted into the distal aspect of the GDA and a selective arteriogram was performed. The focally ectatic segment of the distal aspect of the GDA and it's distal aspect was then percutaneously coil embolized with multiple overlapping 8 mm and 6 mm diameter interlock coils to the level of its more proximal branch division. The microcatheter was retracted into the proximal GDA as well as the common hepatic artery, and completion arteriograms were performed Images were reviewed and the procedure was terminated. At this point, all wires, catheters and sheaths were removed from the patient. Hemostasis was achieved at the right groin access site with deployment of an ExoSeal closure device and manual compression. The patient tolerated the procedure well without immediate post procedural complication. FINDINGS: Selective celiac arteriogram demonstrates conventional takeoff of the GDA. Redemonstrated focally ectatic segment of the distal aspect of the GDA at the level of its trifurcation as was seen on preceding CTA. Note is again made of additional branch of the GDA at its mid aspect however this vessel does not contribute to the ectatic portion of the more distal aspect of the GDA. Following selective  arteriogram and percutaneous coil embolization of the gastroepiploic, the anterior and posterior divisions of the pancreaticoduodenal artery and the distal aspect of the GDA, there is complete occlusion of the ectatic segment of the GDA without persistent flow. The more proximal branch of  the GDA, not contributing to the distal ectatic segment, remains patent. IMPRESSION: Technically successful percutaneous coil embolization of ectatic segment of the distal GDA at the level of its trifurcation. PLAN: - Continued conservative management with daily CBCs as per the primary team. - Repeat endoscopy may be performed at the discretion GI service as clinically indicated. Electronically Signed   By: Sandi Mariscal M.D.   On: 10/30/2018 11:54   Ir Angiogram Selective Each Additional Vessel  Result Date: 10/30/2018 INDICATION: Recent endoscopy with active extravasation from duodenal polyp with subsequent CTA demonstrating focal ectasia of the distal trifurcation of the GDA. Request made for mesenteric arteriogram and potential percutaneous coil embolization. EXAM: 1. ULTRASOUND GUIDANCE FOR ARTERIAL ACCESS 2. CELIAC ARTERIOGRAM (1st ORDER) 3. SELECTIVE GASTRO EPIPLOIC ARTERIOGRAM AND PERCUTANEOUS COIL EMBOLIZATION. 4. SELECTIVE ANTERIOR AND POSTERIOR DIVISIONS OF THE PANCREATICODUODENAL ARTERY AND PERCUTANEOUS COIL EMBOLIZATION. 5. SELECTIVE GASTRODUODENAL ARTERIOGRAM AND PERCUTANEOUS COIL EMBOLIZATION COMPARISON:  CTA abdomen and pelvis - 10/29/2018 MEDICATIONS: None ANESTHESIA/SEDATION: Moderate (conscious) sedation was employed during this procedure. A total of Versed 1 mg and Fentanyl 50 mcg was administered intravenously. Moderate Sedation Time: 70 minutes. The patient's level of consciousness and vital signs were monitored continuously by radiology nursing throughout the procedure under my direct supervision. CONTRAST:  75 cc Isovue-300 FLUOROSCOPY TIME:  20 minutes, 36 seconds (2,694 mGy) COMPLICATIONS: None immediate. PROCEDURE: Informed consent was obtained from the patient's daughter following explanation of the procedure, risks, benefits and alternatives. The patient understands, agrees and consents for the procedure. All questions were addressed. A time out was performed prior to the initiation of  the procedure. Maximal barrier sterile technique utilized including caps, mask, sterile gowns, sterile gloves, large sterile drape, hand hygiene, and Betadine prep. The right femoral head was marked fluoroscopically. Under sterile conditions and local anesthesia, the right common femoral artery access was performed with a micropuncture needle. Under direct ultrasound guidance, the right common femoral was accessed with a micropuncture kit. An ultrasound image was saved for documentation purposes. This allowed for placement of a 5-French vascular sheath. A limited arteriogram was performed through the side arm of the sheath confirming appropriate access within the right common femoral artery. Mickelson catheter was advanced to the level of the thoracic aorta where was reformed, back bled and flushed. Mickelson catheter was then utilized to select the celiac artery and a selective celiac arteriogram was performed. With the use of a fathom 14 microwire, a regular Renegade microcatheter was advanced into the gastroduodenal artery and a selective gastroduodenal arteriogram was performed. Next, the microcatheter was advanced into the posterior division of the pancreaticoduodenal artery. Selective arteriogram was performed and the vessel was percutaneously coil embolized to near its origin with multiple overlapping 4, 5 and 6 mm diameter interlock coils. Next, the microcatheter was advanced into the gastroepiploic artery and a selective gastroepiploic arteriogram was performed. The vessel within percutaneously coil embolized to near its origin with multiple overlapping 6 mm diameter interlock coils. During deployment of the final coil, there was difficulty removing the pushing wire, ultimately resulting in damage to the microcatheter requiring replacement. With the use of a fathom 14 microwire, microcatheter was utilized to select the anterior division of the pancreaticoduodenal artery. Selective arteriogram was performed  and the  vessel was percutaneously coil embolized to near its origin with multiple overlapping 4 and 5 mm diameter interlock coils. Next, the microcatheter was retracted into the distal aspect of the GDA and a selective arteriogram was performed. The focally ectatic segment of the distal aspect of the GDA and it's distal aspect was then percutaneously coil embolized with multiple overlapping 8 mm and 6 mm diameter interlock coils to the level of its more proximal branch division. The microcatheter was retracted into the proximal GDA as well as the common hepatic artery, and completion arteriograms were performed Images were reviewed and the procedure was terminated. At this point, all wires, catheters and sheaths were removed from the patient. Hemostasis was achieved at the right groin access site with deployment of an ExoSeal closure device and manual compression. The patient tolerated the procedure well without immediate post procedural complication. FINDINGS: Selective celiac arteriogram demonstrates conventional takeoff of the GDA. Redemonstrated focally ectatic segment of the distal aspect of the GDA at the level of its trifurcation as was seen on preceding CTA. Note is again made of additional branch of the GDA at its mid aspect however this vessel does not contribute to the ectatic portion of the more distal aspect of the GDA. Following selective arteriogram and percutaneous coil embolization of the gastroepiploic, the anterior and posterior divisions of the pancreaticoduodenal artery and the distal aspect of the GDA, there is complete occlusion of the ectatic segment of the GDA without persistent flow. The more proximal branch of the GDA, not contributing to the distal ectatic segment, remains patent. IMPRESSION: Technically successful percutaneous coil embolization of ectatic segment of the distal GDA at the level of its trifurcation. PLAN: - Continued conservative management with daily CBCs as per the  primary team. - Repeat endoscopy may be performed at the discretion GI service as clinically indicated. Electronically Signed   By: Sandi Mariscal M.D.   On: 10/30/2018 11:54   Ir Angiogram Selective Each Additional Vessel  Result Date: 10/30/2018 INDICATION: Recent endoscopy with active extravasation from duodenal polyp with subsequent CTA demonstrating focal ectasia of the distal trifurcation of the GDA. Request made for mesenteric arteriogram and potential percutaneous coil embolization. EXAM: 1. ULTRASOUND GUIDANCE FOR ARTERIAL ACCESS 2. CELIAC ARTERIOGRAM (1st ORDER) 3. SELECTIVE GASTRO EPIPLOIC ARTERIOGRAM AND PERCUTANEOUS COIL EMBOLIZATION. 4. SELECTIVE ANTERIOR AND POSTERIOR DIVISIONS OF THE PANCREATICODUODENAL ARTERY AND PERCUTANEOUS COIL EMBOLIZATION. 5. SELECTIVE GASTRODUODENAL ARTERIOGRAM AND PERCUTANEOUS COIL EMBOLIZATION COMPARISON:  CTA abdomen and pelvis - 10/29/2018 MEDICATIONS: None ANESTHESIA/SEDATION: Moderate (conscious) sedation was employed during this procedure. A total of Versed 1 mg and Fentanyl 50 mcg was administered intravenously. Moderate Sedation Time: 70 minutes. The patient's level of consciousness and vital signs were monitored continuously by radiology nursing throughout the procedure under my direct supervision. CONTRAST:  75 cc Isovue-300 FLUOROSCOPY TIME:  20 minutes, 36 seconds (6,415 mGy) COMPLICATIONS: None immediate. PROCEDURE: Informed consent was obtained from the patient's daughter following explanation of the procedure, risks, benefits and alternatives. The patient understands, agrees and consents for the procedure. All questions were addressed. A time out was performed prior to the initiation of the procedure. Maximal barrier sterile technique utilized including caps, mask, sterile gowns, sterile gloves, large sterile drape, hand hygiene, and Betadine prep. The right femoral head was marked fluoroscopically. Under sterile conditions and local anesthesia, the right  common femoral artery access was performed with a micropuncture needle. Under direct ultrasound guidance, the right common femoral was accessed with a micropuncture kit. An ultrasound image was saved  for documentation purposes. This allowed for placement of a 5-French vascular sheath. A limited arteriogram was performed through the side arm of the sheath confirming appropriate access within the right common femoral artery. Mickelson catheter was advanced to the level of the thoracic aorta where was reformed, back bled and flushed. Mickelson catheter was then utilized to select the celiac artery and a selective celiac arteriogram was performed. With the use of a fathom 14 microwire, a regular Renegade microcatheter was advanced into the gastroduodenal artery and a selective gastroduodenal arteriogram was performed. Next, the microcatheter was advanced into the posterior division of the pancreaticoduodenal artery. Selective arteriogram was performed and the vessel was percutaneously coil embolized to near its origin with multiple overlapping 4, 5 and 6 mm diameter interlock coils. Next, the microcatheter was advanced into the gastroepiploic artery and a selective gastroepiploic arteriogram was performed. The vessel within percutaneously coil embolized to near its origin with multiple overlapping 6 mm diameter interlock coils. During deployment of the final coil, there was difficulty removing the pushing wire, ultimately resulting in damage to the microcatheter requiring replacement. With the use of a fathom 14 microwire, microcatheter was utilized to select the anterior division of the pancreaticoduodenal artery. Selective arteriogram was performed and the vessel was percutaneously coil embolized to near its origin with multiple overlapping 4 and 5 mm diameter interlock coils. Next, the microcatheter was retracted into the distal aspect of the GDA and a selective arteriogram was performed. The focally ectatic segment  of the distal aspect of the GDA and it's distal aspect was then percutaneously coil embolized with multiple overlapping 8 mm and 6 mm diameter interlock coils to the level of its more proximal branch division. The microcatheter was retracted into the proximal GDA as well as the common hepatic artery, and completion arteriograms were performed Images were reviewed and the procedure was terminated. At this point, all wires, catheters and sheaths were removed from the patient. Hemostasis was achieved at the right groin access site with deployment of an ExoSeal closure device and manual compression. The patient tolerated the procedure well without immediate post procedural complication. FINDINGS: Selective celiac arteriogram demonstrates conventional takeoff of the GDA. Redemonstrated focally ectatic segment of the distal aspect of the GDA at the level of its trifurcation as was seen on preceding CTA. Note is again made of additional branch of the GDA at its mid aspect however this vessel does not contribute to the ectatic portion of the more distal aspect of the GDA. Following selective arteriogram and percutaneous coil embolization of the gastroepiploic, the anterior and posterior divisions of the pancreaticoduodenal artery and the distal aspect of the GDA, there is complete occlusion of the ectatic segment of the GDA without persistent flow. The more proximal branch of the GDA, not contributing to the distal ectatic segment, remains patent. IMPRESSION: Technically successful percutaneous coil embolization of ectatic segment of the distal GDA at the level of its trifurcation. PLAN: - Continued conservative management with daily CBCs as per the primary team. - Repeat endoscopy may be performed at the discretion GI service as clinically indicated. Electronically Signed   By: Sandi Mariscal M.D.   On: 10/30/2018 11:54   Ir Angiogram Selective Each Additional Vessel  Result Date: 10/30/2018 INDICATION: Recent endoscopy  with active extravasation from duodenal polyp with subsequent CTA demonstrating focal ectasia of the distal trifurcation of the GDA. Request made for mesenteric arteriogram and potential percutaneous coil embolization. EXAM: 1. ULTRASOUND GUIDANCE FOR ARTERIAL ACCESS 2. CELIAC ARTERIOGRAM (1st  ORDER) 3. SELECTIVE GASTRO EPIPLOIC ARTERIOGRAM AND PERCUTANEOUS COIL EMBOLIZATION. 4. SELECTIVE ANTERIOR AND POSTERIOR DIVISIONS OF THE PANCREATICODUODENAL ARTERY AND PERCUTANEOUS COIL EMBOLIZATION. 5. SELECTIVE GASTRODUODENAL ARTERIOGRAM AND PERCUTANEOUS COIL EMBOLIZATION COMPARISON:  CTA abdomen and pelvis - 10/29/2018 MEDICATIONS: None ANESTHESIA/SEDATION: Moderate (conscious) sedation was employed during this procedure. A total of Versed 1 mg and Fentanyl 50 mcg was administered intravenously. Moderate Sedation Time: 70 minutes. The patient's level of consciousness and vital signs were monitored continuously by radiology nursing throughout the procedure under my direct supervision. CONTRAST:  75 cc Isovue-300 FLUOROSCOPY TIME:  20 minutes, 36 seconds (8,341 mGy) COMPLICATIONS: None immediate. PROCEDURE: Informed consent was obtained from the patient's daughter following explanation of the procedure, risks, benefits and alternatives. The patient understands, agrees and consents for the procedure. All questions were addressed. A time out was performed prior to the initiation of the procedure. Maximal barrier sterile technique utilized including caps, mask, sterile gowns, sterile gloves, large sterile drape, hand hygiene, and Betadine prep. The right femoral head was marked fluoroscopically. Under sterile conditions and local anesthesia, the right common femoral artery access was performed with a micropuncture needle. Under direct ultrasound guidance, the right common femoral was accessed with a micropuncture kit. An ultrasound image was saved for documentation purposes. This allowed for placement of a 5-French vascular  sheath. A limited arteriogram was performed through the side arm of the sheath confirming appropriate access within the right common femoral artery. Mickelson catheter was advanced to the level of the thoracic aorta where was reformed, back bled and flushed. Mickelson catheter was then utilized to select the celiac artery and a selective celiac arteriogram was performed. With the use of a fathom 14 microwire, a regular Renegade microcatheter was advanced into the gastroduodenal artery and a selective gastroduodenal arteriogram was performed. Next, the microcatheter was advanced into the posterior division of the pancreaticoduodenal artery. Selective arteriogram was performed and the vessel was percutaneously coil embolized to near its origin with multiple overlapping 4, 5 and 6 mm diameter interlock coils. Next, the microcatheter was advanced into the gastroepiploic artery and a selective gastroepiploic arteriogram was performed. The vessel within percutaneously coil embolized to near its origin with multiple overlapping 6 mm diameter interlock coils. During deployment of the final coil, there was difficulty removing the pushing wire, ultimately resulting in damage to the microcatheter requiring replacement. With the use of a fathom 14 microwire, microcatheter was utilized to select the anterior division of the pancreaticoduodenal artery. Selective arteriogram was performed and the vessel was percutaneously coil embolized to near its origin with multiple overlapping 4 and 5 mm diameter interlock coils. Next, the microcatheter was retracted into the distal aspect of the GDA and a selective arteriogram was performed. The focally ectatic segment of the distal aspect of the GDA and it's distal aspect was then percutaneously coil embolized with multiple overlapping 8 mm and 6 mm diameter interlock coils to the level of its more proximal branch division. The microcatheter was retracted into the proximal GDA as well as the  common hepatic artery, and completion arteriograms were performed Images were reviewed and the procedure was terminated. At this point, all wires, catheters and sheaths were removed from the patient. Hemostasis was achieved at the right groin access site with deployment of an ExoSeal closure device and manual compression. The patient tolerated the procedure well without immediate post procedural complication. FINDINGS: Selective celiac arteriogram demonstrates conventional takeoff of the GDA. Redemonstrated focally ectatic segment of the distal aspect of the GDA at  the level of its trifurcation as was seen on preceding CTA. Note is again made of additional branch of the GDA at its mid aspect however this vessel does not contribute to the ectatic portion of the more distal aspect of the GDA. Following selective arteriogram and percutaneous coil embolization of the gastroepiploic, the anterior and posterior divisions of the pancreaticoduodenal artery and the distal aspect of the GDA, there is complete occlusion of the ectatic segment of the GDA without persistent flow. The more proximal branch of the GDA, not contributing to the distal ectatic segment, remains patent. IMPRESSION: Technically successful percutaneous coil embolization of ectatic segment of the distal GDA at the level of its trifurcation. PLAN: - Continued conservative management with daily CBCs as per the primary team. - Repeat endoscopy may be performed at the discretion GI service as clinically indicated. Electronically Signed   By: Sandi Mariscal M.D.   On: 10/30/2018 11:54   Ir US Guidance  Result Date: 10/30/2018 INDICATION: Recent endoscopy with active extravasation from duodenal polyp with subsequent CTA demonstrating focal ectasia of the distal trifurcation of the GDA. Request made for mesenteric arteriogram and potential percutaneous coil embolization. EXAM: 1. ULTRASOUND GUIDANCE FOR ARTERIAL ACCESS 2. CELIAC ARTERIOGRAM (1st ORDER) 3.  SELECTIVE GASTRO EPIPLOIC ARTERIOGRAM AND PERCUTANEOUS COIL EMBOLIZATION. 4. SELECTIVE ANTERIOR AND POSTERIOR DIVISIONS OF THE PANCREATICODUODENAL ARTERY AND PERCUTANEOUS COIL EMBOLIZATION. 5. SELECTIVE GASTRODUODENAL ARTERIOGRAM AND PERCUTANEOUS COIL EMBOLIZATION COMPARISON:  CTA abdomen and pelvis - 10/29/2018 MEDICATIONS: None ANESTHESIA/SEDATION: Moderate (conscious) sedation was employed during this procedure. A total of Versed 1 mg and Fentanyl 50 mcg was administered intravenously. Moderate Sedation Time: 70 minutes. The patient's level of consciousness and vital signs were monitored continuously by radiology nursing throughout the procedure under my direct supervision. CONTRAST:  75 cc Isovue-300 FLUOROSCOPY TIME:  20 minutes, 36 seconds (6,948 mGy) COMPLICATIONS: None immediate. PROCEDURE: Informed consent was obtained from the patient's daughter following explanation of the procedure, risks, benefits and alternatives. The patient understands, agrees and consents for the procedure. All questions were addressed. A time out was performed prior to the initiation of the procedure. Maximal barrier sterile technique utilized including caps, mask, sterile gowns, sterile gloves, large sterile drape, hand hygiene, and Betadine prep. The right femoral head was marked fluoroscopically. Under sterile conditions and local anesthesia, the right common femoral artery access was performed with a micropuncture needle. Under direct ultrasound guidance, the right common femoral was accessed with a micropuncture kit. An ultrasound image was saved for documentation purposes. This allowed for placement of a 5-French vascular sheath. A limited arteriogram was performed through the side arm of the sheath confirming appropriate access within the right common femoral artery. Mickelson catheter was advanced to the level of the thoracic aorta where was reformed, back bled and flushed. Mickelson catheter was then utilized to select the  celiac artery and a selective celiac arteriogram was performed. With the use of a fathom 14 microwire, a regular Renegade microcatheter was advanced into the gastroduodenal artery and a selective gastroduodenal arteriogram was performed. Next, the microcatheter was advanced into the posterior division of the pancreaticoduodenal artery. Selective arteriogram was performed and the vessel was percutaneously coil embolized to near its origin with multiple overlapping 4, 5 and 6 mm diameter interlock coils. Next, the microcatheter was advanced into the gastroepiploic artery and a selective gastroepiploic arteriogram was performed. The vessel within percutaneously coil embolized to near its origin with multiple overlapping 6 mm diameter interlock coils. During deployment of the final coil, there  was difficulty removing the pushing wire, ultimately resulting in damage to the microcatheter requiring replacement. With the use of a fathom 14 microwire, microcatheter was utilized to select the anterior division of the pancreaticoduodenal artery. Selective arteriogram was performed and the vessel was percutaneously coil embolized to near its origin with multiple overlapping 4 and 5 mm diameter interlock coils. Next, the microcatheter was retracted into the distal aspect of the GDA and a selective arteriogram was performed. The focally ectatic segment of the distal aspect of the GDA and it's distal aspect was then percutaneously coil embolized with multiple overlapping 8 mm and 6 mm diameter interlock coils to the level of its more proximal branch division. The microcatheter was retracted into the proximal GDA as well as the common hepatic artery, and completion arteriograms were performed Images were reviewed and the procedure was terminated. At this point, all wires, catheters and sheaths were removed from the patient. Hemostasis was achieved at the right groin access site with deployment of an ExoSeal closure device and  manual compression. The patient tolerated the procedure well without immediate post procedural complication. FINDINGS: Selective celiac arteriogram demonstrates conventional takeoff of the GDA. Redemonstrated focally ectatic segment of the distal aspect of the GDA at the level of its trifurcation as was seen on preceding CTA. Note is again made of additional branch of the GDA at its mid aspect however this vessel does not contribute to the ectatic portion of the more distal aspect of the GDA. Following selective arteriogram and percutaneous coil embolization of the gastroepiploic, the anterior and posterior divisions of the pancreaticoduodenal artery and the distal aspect of the GDA, there is complete occlusion of the ectatic segment of the GDA without persistent flow. The more proximal branch of the GDA, not contributing to the distal ectatic segment, remains patent. IMPRESSION: Technically successful percutaneous coil embolization of ectatic segment of the distal GDA at the level of its trifurcation. PLAN: - Continued conservative management with daily CBCs as per the primary team. - Repeat endoscopy may be performed at the discretion GI service as clinically indicated. Electronically Signed   By: Sandi Mariscal M.D.   On: 10/30/2018 11:54   Dg Chest Port 1 View  Result Date: 10/30/2018 CLINICAL DATA:  Chest pain and shortness of breath. EXAM: PORTABLE CHEST 1 VIEW COMPARISON:  Single-view of the chest 10/27/2018 and 06/14/2016. FINDINGS: Pulmonary edema appears somewhat worse than on the most recent comparison. Heart size is upper normal. No pneumothorax or pleural effusion. Aortic atherosclerosis noted. No acute bony abnormality. IMPRESSION: Pulmonary edema appears somewhat worse than on the most recent comparison. No other change. Atherosclerosis. Electronically Signed   By: Inge Rise M.D.   On: 10/30/2018 16:52   Ir Embo Art  [place] Guide Roadmapping  Result Date:  10/30/2018 INDICATION: Recent endoscopy with active extravasation from duodenal polyp with subsequent CTA demonstrating focal ectasia of the distal trifurcation of the GDA. Request made for mesenteric arteriogram and potential percutaneous coil embolization. EXAM: 1. ULTRASOUND GUIDANCE FOR ARTERIAL ACCESS 2. CELIAC ARTERIOGRAM (1st ORDER) 3. SELECTIVE GASTRO EPIPLOIC ARTERIOGRAM AND PERCUTANEOUS COIL EMBOLIZATION. 4. SELECTIVE ANTERIOR AND POSTERIOR DIVISIONS OF THE PANCREATICODUODENAL ARTERY AND PERCUTANEOUS COIL EMBOLIZATION. 5. SELECTIVE GASTRODUODENAL ARTERIOGRAM AND PERCUTANEOUS COIL EMBOLIZATION COMPARISON:  CTA abdomen and pelvis - 10/29/2018 MEDICATIONS: None ANESTHESIA/SEDATION: Moderate (conscious) sedation was employed during this procedure. A total of Versed 1 mg and Fentanyl 50 mcg was administered intravenously. Moderate Sedation Time: 70 minutes. The patient's level of consciousness  and vital signs were monitored continuously by radiology nursing throughout the procedure under my direct supervision. CONTRAST:  75 cc Isovue-300 FLUOROSCOPY TIME:  20 minutes, 36 seconds (4,193 mGy) COMPLICATIONS: None immediate. PROCEDURE: Informed consent was obtained from the patient's daughter following explanation of the procedure, risks, benefits and alternatives. The patient understands, agrees and consents for the procedure. All questions were addressed. A time out was performed prior to the initiation of the procedure. Maximal barrier sterile technique utilized including caps, mask, sterile gowns, sterile gloves, large sterile drape, hand hygiene, and Betadine prep. The right femoral head was marked fluoroscopically. Under sterile conditions and local anesthesia, the right common femoral artery access was performed with a micropuncture needle. Under direct ultrasound guidance, the right common femoral was accessed with a micropuncture kit. An ultrasound image was saved for documentation purposes. This allowed  for placement of a 5-French vascular sheath. A limited arteriogram was performed through the side arm of the sheath confirming appropriate access within the right common femoral artery. Mickelson catheter was advanced to the level of the thoracic aorta where was reformed, back bled and flushed. Mickelson catheter was then utilized to select the celiac artery and a selective celiac arteriogram was performed. With the use of a fathom 14 microwire, a regular Renegade microcatheter was advanced into the gastroduodenal artery and a selective gastroduodenal arteriogram was performed. Next, the microcatheter was advanced into the posterior division of the pancreaticoduodenal artery. Selective arteriogram was performed and the vessel was percutaneously coil embolized to near its origin with multiple overlapping 4, 5 and 6 mm diameter interlock coils. Next, the microcatheter was advanced into the gastroepiploic artery and a selective gastroepiploic arteriogram was performed. The vessel within percutaneously coil embolized to near its origin with multiple overlapping 6 mm diameter interlock coils. During deployment of the final coil, there was difficulty removing the pushing wire, ultimately resulting in damage to the microcatheter requiring replacement. With the use of a fathom 14 microwire, microcatheter was utilized to select the anterior division of the pancreaticoduodenal artery. Selective arteriogram was performed and the vessel was percutaneously coil embolized to near its origin with multiple overlapping 4 and 5 mm diameter interlock coils. Next, the microcatheter was retracted into the distal aspect of the GDA and a selective arteriogram was performed. The focally ectatic segment of the distal aspect of the GDA and it's distal aspect was then percutaneously coil embolized with multiple overlapping 8 mm and 6 mm diameter interlock coils to the level of its more proximal branch division. The microcatheter was retracted  into the proximal GDA as well as the common hepatic artery, and completion arteriograms were performed Images were reviewed and the procedure was terminated. At this point, all wires, catheters and sheaths were removed from the patient. Hemostasis was achieved at the right groin access site with deployment of an ExoSeal closure device and manual compression. The patient tolerated the procedure well without immediate post procedural complication. FINDINGS: Selective celiac arteriogram demonstrates conventional takeoff of the GDA. Redemonstrated focally ectatic segment of the distal aspect of the GDA at the level of its trifurcation as was seen on preceding CTA. Note is again made of additional branch of the GDA at its mid aspect however this vessel does not contribute to the ectatic portion of the more distal aspect of the GDA. Following selective arteriogram and percutaneous coil embolization of the gastroepiploic, the anterior and posterior divisions of the pancreaticoduodenal artery and the distal aspect of the GDA, there is complete occlusion of  the ectatic segment of the GDA without persistent flow. The more proximal branch of the GDA, not contributing to the distal ectatic segment, remains patent. IMPRESSION: Technically successful percutaneous coil embolization of ectatic segment of the distal GDA at the level of its trifurcation. PLAN: - Continued conservative management with daily CBCs as per the primary team. - Repeat endoscopy may be performed at the discretion GI service as clinically indicated. Electronically Signed   By: Sandi Mariscal M.D.   On: 10/30/2018 11:54    Lattie Haw, MD 11/01/2018, 5:47 AM PGY-1, [place] [facility] pager: [phone], text pages welcome

## 2018-11-01 NOTE — Progress Notes (Signed)
Subjective: No complaints.  Feeling well.  No issues with breathing.  Objective: Vital signs in last 24 hours: Temp:  [97.8 F (36.6 C)-98.4 F (36.9 C)] 98.3 F (36.8 C) (07/14 1154) Pulse Rate:  [79-82] 79 (07/14 1154) Resp:  [18-19] 18 (07/14 1154) BP: (135-159)/(47-69) 138/69 (07/14 1154) SpO2:  [96 %-99 %] 96 % (07/14 1154) Weight:  [79 kg] 79 kg (07/14 1310) Last BM Date: 10/30/18  Intake/Output from previous day: 07/13 0701 - 07/14 0700 In: 240 [P.O.:240] Out: -  Intake/Output this shift: Total I/O In: 360 [P.O.:360] Out: 150 [Urine:150]  General appearance: alert and no distress GI: soft, non-tender; bowel sounds normal; no masses,  no organomegaly  Lab Results: Recent Labs    10/30/18 1544 10/31/18 0246 11/01/18 0117  WBC 8.6 8.5 9.4  HGB 7.9* 7.3* 7.3*  HCT 26.8* 24.8* 24.7*  PLT 218 207 221   BMET Recent Labs    10/30/18 1544 10/31/18 0246 11/01/18 0117  NA 140 139 138  K 3.8 3.6 3.5  CL 109 109 103  CO2 21* 23 25  GLUCOSE 146* 92 126*  BUN 11 12 18   CREATININE 1.02* 0.92 1.25*  CALCIUM 8.2* 8.1* 8.3*   LFT Recent Labs    10/30/18 1544  PROT 5.9*  ALBUMIN 2.9*  AST 14*  ALT 11  ALKPHOS 81  BILITOT 0.9   PT/INR No results for input(s): LABPROT, INR in the last 72 hours. Hepatitis Panel No results for input(s): HEPBSAG, HCVAB, HEPAIGM, HEPBIGM in the last 72 hours. C-Diff No results for input(s): CDIFFTOX in the last 72 hours. Fecal Lactopherrin No results for input(s): FECLLACTOFRN in the last 72 hours.  Studies/Results: Dg Chest Port 1 View  Result Date: 10/30/2018 CLINICAL DATA:  Chest pain and shortness of breath. EXAM: PORTABLE CHEST 1 VIEW COMPARISON:  Single-view of the chest 10/27/2018 and 06/14/2016. FINDINGS: Pulmonary edema appears somewhat worse than on the most recent comparison. Heart size is upper normal. No pneumothorax or pleural effusion. Aortic atherosclerosis noted. No acute bony abnormality. IMPRESSION:  Pulmonary edema appears somewhat worse than on the most recent comparison. No other change. Atherosclerosis. Electronically Signed   By: Inge Rise M.D.   On: 10/30/2018 16:52    Medications:  Scheduled: . atorvastatin  40 mg Oral Daily  . carvedilol  12.5 mg Oral BID WC  . lisinopril  2.5 mg Oral Daily  . pantoprazole (PROTONIX) IV  40 mg Intravenous Q12H  . sertraline  25 mg Oral Daily   Continuous: . sodium chloride      Assessment/Plan: 1) Ectatic GDA s/p coiling. 2) Anemia.   From the GI standpoint she appears stable.  Her HGB is stable at 7.3 g/dL.  Her pulmonary edema from yesterday appears to have resolved.  There is no further GI work up or intervention planned.  Plan: 1) Signing off. 2) Follow up with Dr. Collene Mares in 2 weeks.  LOS: 5 days   Ebonye Reade D 11/01/2018, 2:05 PM

## 2018-11-01 NOTE — Progress Notes (Signed)
Unable to redirect the patient.  Patient is still attempting to the leave the unit with her things.  Patient is currently charging towards the staff and trying to exit the unit.  Security was called and this nurse was able to administer the one time dose of 1 mg of Haldol. Staff was able to direct the patient back into bed.  Will continue to monitor the patient and notify as needed/

## 2018-11-01 NOTE — Progress Notes (Signed)
Patient is resting in bed.  Will continue to monitor the patient and notify as needed

## 2018-11-01 NOTE — Progress Notes (Signed)
Talked with Pt's daughter via phone, updated POC and addressed questions/concerns.

## 2018-11-02 LAB — BASIC METABOLIC PANEL
Anion gap: 13 (ref 5–15)
BUN: 23 mg/dL (ref 8–23)
CO2: 26 mmol/L (ref 22–32)
Calcium: 7.8 mg/dL — ABNORMAL LOW (ref 8.9–10.3)
Chloride: 99 mmol/L (ref 98–111)
Creatinine, Ser: 1.26 mg/dL — ABNORMAL HIGH (ref 0.44–1.00)
GFR calc Af Amer: 48 mL/min — ABNORMAL LOW (ref >60)
GFR calc non Af Amer: 42 mL/min — ABNORMAL LOW (ref >60)
Glucose, Bld: 139 mg/dL — ABNORMAL HIGH (ref 70–99)
Potassium: 2.9 mmol/L — ABNORMAL LOW (ref 3.5–5.1)
Sodium: 138 mmol/L (ref 135–145)

## 2018-11-02 LAB — CBC
HCT: 24.9 % — ABNORMAL LOW (ref 36.0–46.0)
Hemoglobin: 7.5 g/dL — ABNORMAL LOW (ref 12.0–15.0)
MCH: 26.5 pg (ref 26.0–34.0)
MCHC: 30.1 g/dL (ref 30.0–36.0)
MCV: 88 fL (ref 80.0–100.0)
Platelets: 231 10*3/uL (ref 150–400)
RBC: 2.83 MIL/uL — ABNORMAL LOW (ref 3.87–5.11)
RDW: 16 % — ABNORMAL HIGH (ref 11.5–15.5)
WBC: 8 10*3/uL (ref 4.0–10.5)
nRBC: 0 % (ref 0.0–0.2)

## 2018-11-02 LAB — MAGNESIUM: Magnesium: 1.1 mg/dL — ABNORMAL LOW (ref 1.7–2.4)

## 2018-11-02 MED ORDER — MAGNESIUM SULFATE 2 GM/50ML IV SOLN
2.0000 g | Freq: Once | INTRAVENOUS | Status: AC
  Administered 2018-11-02: 2 g via INTRAVENOUS
  Filled 2018-11-02: qty 50

## 2018-11-02 MED ORDER — LISINOPRIL 2.5 MG PO TABS: 2.5000 mg | ORAL_TABLET | Freq: Every day | ORAL | 0 refills | Status: DC

## 2018-11-02 MED ORDER — POTASSIUM CHLORIDE CRYS ER 20 MEQ PO TBCR: 60.0000 meq | EXTENDED_RELEASE_TABLET | Freq: Two times a day (BID) | ORAL | Status: DC

## 2018-11-02 MED ORDER — POTASSIUM CHLORIDE CRYS ER 20 MEQ PO TBCR
60.0000 meq | EXTENDED_RELEASE_TABLET | Freq: Once | ORAL | Status: AC
  Administered 2018-11-02: 60 meq via ORAL
  Filled 2018-11-02: qty 3

## 2018-11-02 NOTE — Discharge Instructions (Signed)
Hannah [name], Hannah [name] were admitted for your anemia. We have given you a transfusion and investigated the cause of the anemia. You were found to a small bleed on your endoscopy. We have corrected this.   We stopped one of your medications in hospital lisinopril-hydrochlorthiazide. We have started you on lisinopril.  If you have chest pain, shortness of breath, abdominal pain, blood in the stools etc please go to the ER urgently.  Kind regards  Dr Lattie Haw

## 2018-11-02 NOTE — Progress Notes (Signed)
11/02/2018  Family Medicine Teaching Service Daily Progress Note Intern Pager: [phone]  [name] Medical record number: [id number] Date of birth: 28-Apr-1945 Age: 73 y.o. Gender: female  Primary Care Provider: [name], MD Consultants: GI  Code Status: Full   Pt Overview and Major Events to Date:  Hannah [name] is a 73 y.o. female presenting with abdominal pain, vomiting and diarrheax 2 . Found to have Hb 6.9 on admission. PMH is significant for HTN, HLD,GERD, Arthritis, Anxiety, Anemia with previous blood transfusions  Assessment and Plan:  Symptomatic anemia, likely secondary to GI bleed EGD (07/10) A single 7 mm sessile polypoid lesion with bleeding was found in the duodenal bulb with fresh blood oozing from the center.  A single angioectasia without bleeding was found in the duodenal bulb  EGD otherwise normal appearing  per GI report  Colonoscopy (07/10) Diverticulosis in the sigmoid colon  A 10 mm polypoid lesion was found in the proximal sigmoid colon-biopsies done.  Two 6-7 mm sessile polyps were found in the proximal ascending colon; polyps were removed     Per GI awaiting biopsy results  Percutaneous mesenteric arteriogram and percutaneous coil embolization 7/12 Embolization of pseudoaneurym found on CT Abdomen, pelvis scan Procedure went well, no complications    Currently asymptomatic, FOBT negative Hb 7.5 today (7.3 7/14)   GI would like to keep Hb over 7. GI signed off on 7/13, no interventions or GI work up planned. FU in 2 week with Dr Collene Mares -Monitor for active hematemesis or melena -Monitor CBC daily  -IV PPI, switch back to oral PPI on discharge -SCDs  Hypoxia 2/2 fluid overload from fluids Echo 7/13 Left ventricle has normal systolic function with an ejection fraction of 60-65%. The cavity size was normal. Left ventricular diastolic Doppler parameters are consistent with pseudonormalization. The right ventricle has  normal systolic function.Left atrial size was moderately dilated. Mild to moderate MR. Mild calcification of the aortic valve Sats 94-98% room air and pt is much improved CXR after rapid response 7/12: pulmonary edema likely from fluid administration -Stop IV furosemide today, (given X 1 dose 40mg  iv furosemide on 7/13 and 7/14)  -Clarify diuresis plan with Dr Ardelia Mems on discharge  Chronic HTN    BP today over last 24 hrs 130/57-159/54. Home meds:carvedilol and lisinopril-hctz at home. -continue carvdeilol -continue lisinopril only 7/13 -continue to monitor BP -PCP to monitor BP on d/c as stopped hydralazine in hospital  Gout Currently asymptomatic. Home meds include Colchicine and Allopurinol.  Per patient she does not take these. -Monitor for symptoms of gout  GERD Currently asymptomatic. On omeprazole at home. -IV PPI recent GI bleed -switch back to pt's PPI on discharge  Anxiety On sertraline daily and as needed Xanax at home -Continue home meds  FEN/GI: normal diet, IV PPI  PPx: SCDs  Disposition: awaiting medical condition to stabilse  Subjective:  Doing well, eating breakfast. Sitter present. Denies chest pain, dyspnea, nausea or vomiting. No new concerns. Looking forward to being discharge  Objective: Temp:  [97.8 F (36.6 C)-98.5 F (36.9 C)] 98.2 F (36.8 C) (07/15 0603) Pulse Rate:  [78-85] 79 (07/15 0603) Resp:  [16-18] 16 (07/15 0603) BP: (124-159)/(49-70) 124/49 (07/15 0603) SpO2:  [94 %-99 %] 94 % (07/15 0603) Weight:  [79 kg] 79 kg (07/14 1310)   General: Looks well,cooperative and appears to be in no acute distress HEENT: Neck non-tender without lymphadenopathy, masses or thyromegaly Cardio: Normal S1 and S2, no S3 or  S4. Rhythm is regular. No murmurs or rubs.   Pulm: Clear to auscultation bilaterally, no crackles, wheezing, or diminished breath sounds. Normal respiratory effort Abdomen: Bowel sounds normal. Abdomen soft and non-tender.   Extremities: No peripheral edema. Warm/ well perfused.  Strong radial pulse. Neuro: Cranial nerves grossly intact  Laboratory: Recent Labs  Lab 10/31/18 0246 11/01/18 0117 11/02/18 0312  WBC 8.5 9.4 8.0  HGB 7.3* 7.3* 7.5*  HCT 24.8* 24.7* 24.9*  PLT 207 221 231   Recent Labs  Lab 10/28/18 0211  10/30/18 1544 10/31/18 0246 11/01/18 0117 11/02/18 0312  NA 139   < > 140 139 138 138  K 4.2   < > 3.8 3.6 3.5 2.9*  CL 104   < > 109 109 103 99  CO2 25   < > 21* 23 25 26   BUN 24*   < > 11 12 18 23   CREATININE 1.11*   < > 1.02* 0.92 1.25* 1.26*  CALCIUM 8.7*   < > 8.2* 8.1* 8.3* 7.8*  PROT 6.4*  --  5.9*  --   --   --   BILITOT 1.3*  --  0.9  --   --   --   ALKPHOS 97  --  81  --   --   --   ALT 11  --  11  --   --   --   AST 13*  --  14*  --   --   --   GLUCOSE 108*   < > 146* 92 126* 139*   < > = values in this interval not displayed.      Imaging/Diagnostic Tests: No results found.  Lattie Haw, MD 11/02/2018, 6:04 AM PGY-1, [place] Intern pager: [phone], text pages welcome

## 2018-11-02 NOTE — Progress Notes (Signed)
Pt's daughter given discharge instructions, prescriptions, and care notes. Pt's daughter verbalized understanding AEB no further questions or concerns at this time. IV was discontinued, no redness, pain, or swelling noted at this time. Pt left the floor via wheelchair with staff in stable condition.

## 2018-11-02 NOTE — Plan of Care (Signed)
Pt is making progress, ambulated in the room to bathroom.  No respiratory issue noted, Vital signs stable,  No acute distress noted.

## 2018-11-05 DIAGNOSIS — J811 Chronic pulmonary edema: Secondary | ICD-10-CM | POA: Diagnosis not present

## 2018-11-06 ENCOUNTER — Inpatient Hospital Stay (HOSPITAL_COMMUNITY): Payer: Medicare Other

## 2018-11-06 ENCOUNTER — Other Ambulatory Visit: Payer: Self-pay

## 2018-11-06 ENCOUNTER — Inpatient Hospital Stay (HOSPITAL_COMMUNITY)
Admission: EM | Admit: 2018-11-06 | Discharge: 2018-11-08 | DRG: 291 | Disposition: A | Discharge status: Home / Self Care | Payer: Medicare Other | Attending: Family Medicine | Admitting: Family Medicine

## 2018-11-06 ENCOUNTER — Encounter (HOSPITAL_COMMUNITY): Payer: Self-pay | Admitting: Emergency Medicine

## 2018-11-06 ENCOUNTER — Emergency Department (HOSPITAL_COMMUNITY): Payer: Medicare Other

## 2018-11-06 DIAGNOSIS — M109 Gout, unspecified: Secondary | ICD-10-CM | POA: Diagnosis not present

## 2018-11-06 DIAGNOSIS — F0390 Unspecified dementia without behavioral disturbance: Secondary | ICD-10-CM | POA: Diagnosis present

## 2018-11-06 DIAGNOSIS — I5033 Acute on chronic diastolic (congestive) heart failure: Secondary | ICD-10-CM | POA: Diagnosis present

## 2018-11-06 DIAGNOSIS — E785 Hyperlipidemia, unspecified: Secondary | ICD-10-CM | POA: Diagnosis present

## 2018-11-06 DIAGNOSIS — I130 Hypertensive heart and chronic kidney disease with heart failure and stage 1 through stage 4 chronic kidney disease, or unspecified chronic kidney disease: Secondary | ICD-10-CM | POA: Diagnosis not present

## 2018-11-06 DIAGNOSIS — J811 Chronic pulmonary edema: Secondary | ICD-10-CM | POA: Diagnosis not present

## 2018-11-06 DIAGNOSIS — D649 Anemia, unspecified: Secondary | ICD-10-CM | POA: Diagnosis present

## 2018-11-06 DIAGNOSIS — E7800 Pure hypercholesterolemia, unspecified: Secondary | ICD-10-CM | POA: Diagnosis not present

## 2018-11-06 DIAGNOSIS — R7989 Other specified abnormal findings of blood chemistry: Secondary | ICD-10-CM | POA: Diagnosis not present

## 2018-11-06 DIAGNOSIS — Z6830 Body mass index (BMI) 30.0-30.9, adult: Secondary | ICD-10-CM

## 2018-11-06 DIAGNOSIS — I5032 Chronic diastolic (congestive) heart failure: Secondary | ICD-10-CM | POA: Diagnosis not present

## 2018-11-06 DIAGNOSIS — E43 Unspecified severe protein-calorie malnutrition: Secondary | ICD-10-CM | POA: Diagnosis present

## 2018-11-06 DIAGNOSIS — R0600 Dyspnea, unspecified: Secondary | ICD-10-CM | POA: Diagnosis not present

## 2018-11-06 DIAGNOSIS — J449 Chronic obstructive pulmonary disease, unspecified: Secondary | ICD-10-CM | POA: Diagnosis not present

## 2018-11-06 DIAGNOSIS — F411 Generalized anxiety disorder: Secondary | ICD-10-CM | POA: Diagnosis present

## 2018-11-06 DIAGNOSIS — R0602 Shortness of breath: Secondary | ICD-10-CM | POA: Diagnosis not present

## 2018-11-06 DIAGNOSIS — K219 Gastro-esophageal reflux disease without esophagitis: Secondary | ICD-10-CM | POA: Diagnosis not present

## 2018-11-06 DIAGNOSIS — N183 Chronic kidney disease, stage 3 (moderate): Secondary | ICD-10-CM | POA: Diagnosis not present

## 2018-11-06 DIAGNOSIS — Z87891 Personal history of nicotine dependence: Secondary | ICD-10-CM

## 2018-11-06 DIAGNOSIS — J810 Acute pulmonary edema: Secondary | ICD-10-CM | POA: Diagnosis not present

## 2018-11-06 DIAGNOSIS — R0689 Other abnormalities of breathing: Secondary | ICD-10-CM | POA: Diagnosis not present

## 2018-11-06 DIAGNOSIS — Z1159 Encounter for screening for other viral diseases: Secondary | ICD-10-CM | POA: Diagnosis not present

## 2018-11-06 DIAGNOSIS — R55 Syncope and collapse: Secondary | ICD-10-CM

## 2018-11-06 DIAGNOSIS — R739 Hyperglycemia, unspecified: Secondary | ICD-10-CM | POA: Diagnosis present

## 2018-11-06 DIAGNOSIS — I499 Cardiac arrhythmia, unspecified: Secondary | ICD-10-CM | POA: Diagnosis not present

## 2018-11-06 DIAGNOSIS — Z79899 Other long term (current) drug therapy: Secondary | ICD-10-CM

## 2018-11-06 DIAGNOSIS — I509 Heart failure, unspecified: Secondary | ICD-10-CM | POA: Diagnosis not present

## 2018-11-06 DIAGNOSIS — I447 Left bundle-branch block, unspecified: Secondary | ICD-10-CM | POA: Diagnosis not present

## 2018-11-06 LAB — BASIC METABOLIC PANEL
Anion gap: 15 (ref 5–15)
BUN: 28 mg/dL — ABNORMAL HIGH (ref 8–23)
CO2: 19 mmol/L — ABNORMAL LOW (ref 22–32)
Calcium: 8.3 mg/dL — ABNORMAL LOW (ref 8.9–10.3)
Chloride: 103 mmol/L (ref 98–111)
Creatinine, Ser: 1.5 mg/dL — ABNORMAL HIGH (ref 0.44–1.00)
GFR calc Af Amer: 39 mL/min — ABNORMAL LOW (ref >60)
GFR calc non Af Amer: 34 mL/min — ABNORMAL LOW (ref >60)
Glucose, Bld: 257 mg/dL — ABNORMAL HIGH (ref 70–99)
Potassium: 4.8 mmol/L (ref 3.5–5.1)
Sodium: 137 mmol/L (ref 135–145)

## 2018-11-06 LAB — URINALYSIS, ROUTINE W REFLEX MICROSCOPIC
Bilirubin Urine: NEGATIVE
Glucose, UA: NEGATIVE mg/dL
Hgb urine dipstick: NEGATIVE
Ketones, ur: NEGATIVE mg/dL
Nitrite: NEGATIVE
Protein, ur: NEGATIVE mg/dL
Specific Gravity, Urine: 1.008 (ref 1.005–1.030)
pH: 5 (ref 5.0–8.0)

## 2018-11-06 LAB — HEMOGLOBIN A1C
Hgb A1c MFr Bld: 5.3 % (ref 4.8–5.6)
Mean Plasma Glucose: 105.41 mg/dL

## 2018-11-06 LAB — CBC WITH DIFFERENTIAL/PLATELET
Abs Immature Granulocytes: 0.06 10*3/uL (ref 0.00–0.07)
Basophils Absolute: 0.1 10*3/uL (ref 0.0–0.1)
Basophils Relative: 1 %
Eosinophils Absolute: 0 10*3/uL (ref 0.0–0.5)
Eosinophils Relative: 0 %
HCT: 34.7 % — ABNORMAL LOW (ref 36.0–46.0)
Hemoglobin: 9.9 g/dL — ABNORMAL LOW (ref 12.0–15.0)
Immature Granulocytes: 1 %
Lymphocytes Relative: 31 %
Lymphs Abs: 4 10*3/uL (ref 0.7–4.0)
MCH: 26.6 pg (ref 26.0–34.0)
MCHC: 28.5 g/dL — ABNORMAL LOW (ref 30.0–36.0)
MCV: 93.3 fL (ref 80.0–100.0)
Monocytes Absolute: 0.8 10*3/uL (ref 0.1–1.0)
Monocytes Relative: 6 %
Neutro Abs: 8 10*3/uL — ABNORMAL HIGH (ref 1.7–7.7)
Neutrophils Relative %: 61 %
Platelets: 449 10*3/uL — ABNORMAL HIGH (ref 150–400)
RBC: 3.72 MIL/uL — ABNORMAL LOW (ref 3.87–5.11)
RDW: 15.8 % — ABNORMAL HIGH (ref 11.5–15.5)
WBC: 13 10*3/uL — ABNORMAL HIGH (ref 4.0–10.5)
nRBC: 0 % (ref 0.0–0.2)

## 2018-11-06 LAB — D-DIMER, QUANTITATIVE: D-Dimer, Quant: 3.41 ug/mL-FEU — ABNORMAL HIGH (ref 0.00–0.50)

## 2018-11-06 LAB — TROPONIN I (HIGH SENSITIVITY)
Troponin I (High Sensitivity): 17 ng/L (ref <18)
Troponin I (High Sensitivity): 40 ng/L — ABNORMAL HIGH (ref <18)
Troponin I (High Sensitivity): 33 ng/L — ABNORMAL HIGH (ref <18)

## 2018-11-06 LAB — GLUCOSE, CAPILLARY
Glucose-Capillary: 115 mg/dL — ABNORMAL HIGH (ref 70–99)
Glucose-Capillary: 161 mg/dL — ABNORMAL HIGH (ref 70–99)

## 2018-11-06 LAB — BRAIN NATRIURETIC PEPTIDE: B Natriuretic Peptide: 358.1 pg/mL — ABNORMAL HIGH (ref 0.0–100.0)

## 2018-11-06 LAB — MAGNESIUM: Magnesium: 1.7 mg/dL (ref 1.7–2.4)

## 2018-11-06 LAB — SARS CORONAVIRUS 2 BY RT PCR (HOSPITAL ORDER, PERFORMED IN ~~LOC~~ HOSPITAL LAB): SARS Coronavirus 2: NEGATIVE

## 2018-11-06 LAB — CBG MONITORING, ED: Glucose-Capillary: 246 mg/dL — ABNORMAL HIGH (ref 70–99)

## 2018-11-06 LAB — TSH: TSH: 5.534 u[IU]/mL — ABNORMAL HIGH (ref 0.350–4.500)

## 2018-11-06 MED ORDER — ACETAMINOPHEN 325 MG PO TABS: 650.0000 mg | ORAL_TABLET | Freq: Four times a day (QID) | ORAL | Status: DC | PRN

## 2018-11-06 MED ORDER — ONDANSETRON HCL 4 MG/2ML IJ SOLN: 4.0000 mg | Freq: Four times a day (QID) | INTRAMUSCULAR | Status: DC | PRN

## 2018-11-06 MED ORDER — SERTRALINE HCL 50 MG PO TABS
50.0000 mg | ORAL_TABLET | Freq: Every day | ORAL | Status: DC
  Administered 2018-11-06 – 2018-11-08 (×3): 50 mg via ORAL
  Filled 2018-11-06 (×3): qty 1

## 2018-11-06 MED ORDER — INSULIN ASPART 100 UNIT/ML ~~LOC~~ SOLN
0.0000 [IU] | Freq: Three times a day (TID) | SUBCUTANEOUS | Status: DC
  Administered 2018-11-08: 2 [IU] via SUBCUTANEOUS

## 2018-11-06 MED ORDER — ENSURE ENLIVE PO LIQD
237.0000 mL | Freq: Two times a day (BID) | ORAL | Status: DC
  Administered 2018-11-06 – 2018-11-08 (×5): 237 mL via ORAL

## 2018-11-06 MED ORDER — CARVEDILOL 12.5 MG PO TABS
12.5000 mg | ORAL_TABLET | Freq: Two times a day (BID) | ORAL | Status: DC
  Administered 2018-11-06 – 2018-11-08 (×5): 12.5 mg via ORAL
  Filled 2018-11-06 (×5): qty 1

## 2018-11-06 MED ORDER — PANTOPRAZOLE SODIUM 40 MG PO TBEC
40.0000 mg | DELAYED_RELEASE_TABLET | Freq: Every day | ORAL | Status: DC
  Administered 2018-11-06 – 2018-11-08 (×3): 40 mg via ORAL
  Filled 2018-11-06 (×3): qty 1

## 2018-11-06 MED ORDER — ALPRAZOLAM 0.25 MG PO TABS
0.2500 mg | ORAL_TABLET | Freq: Once | ORAL | Status: AC
  Administered 2018-11-06: 0.25 mg via ORAL
  Filled 2018-11-06: qty 1

## 2018-11-06 MED ORDER — ACETAMINOPHEN 650 MG RE SUPP: 650.0000 mg | Freq: Four times a day (QID) | RECTAL | Status: DC | PRN

## 2018-11-06 MED ORDER — FUROSEMIDE 10 MG/ML IJ SOLN
40.0000 mg | Freq: Once | INTRAMUSCULAR | Status: AC
  Administered 2018-11-06: 02:00:00 40 mg via INTRAVENOUS
  Filled 2018-11-06: qty 4

## 2018-11-06 MED ORDER — ONDANSETRON HCL 4 MG PO TABS: 4.0000 mg | ORAL_TABLET | Freq: Four times a day (QID) | ORAL | Status: DC | PRN

## 2018-11-06 MED ORDER — ATORVASTATIN CALCIUM 40 MG PO TABS
40.0000 mg | ORAL_TABLET | Freq: Every day | ORAL | Status: DC
  Administered 2018-11-06 – 2018-11-08 (×3): 40 mg via ORAL
  Filled 2018-11-06 (×3): qty 1

## 2018-11-06 MED ORDER — IOHEXOL 350 MG/ML SOLN
75.0000 mL | Freq: Once | INTRAVENOUS | Status: AC | PRN
  Administered 2018-11-06: 60 mL via INTRAVENOUS

## 2018-11-06 MED ORDER — FUROSEMIDE 10 MG/ML IJ SOLN: 40.0000 mg | Freq: Once | INTRAMUSCULAR | Status: DC

## 2018-11-06 MED ORDER — SERTRALINE HCL 50 MG PO TABS: 25.0000 mg | ORAL_TABLET | Freq: Every day | ORAL | Status: DC

## 2018-11-06 MED ORDER — FUROSEMIDE 10 MG/ML IJ SOLN
40.0000 mg | Freq: Once | INTRAMUSCULAR | Status: AC
  Administered 2018-11-06: 40 mg via INTRAVENOUS
  Filled 2018-11-06: qty 4

## 2018-11-06 MED ORDER — NITROGLYCERIN IN D5W 200-5 MCG/ML-% IV SOLN: 0.0000 ug/min | INTRAVENOUS | Status: DC

## 2018-11-06 NOTE — Consult Note (Signed)
CARDIOLOGY CONSULT NOTE       Patient ID: Hannah [name] MRN: [id number] DOB/AGE: 11-11-1945 73 y.o.  Admit date: 11/06/2018 Referring Physician: Erin Hearing Primary Physician: [name], MD Primary Cardiologist: None Reason for Consultation: Dyspne/Synocpe  Active Problems:   CHF (congestive heart failure) ([place])   Acute pulmonary edema (H[facility])   HPI:  73 y.o. no cardiac history limited memory of events. Was out with family had dyspnea and ? Passed out. Denies chest pain palpitations, leg pain LE edema or history of DVT On interview she is asymptomatic with no symptoms now ECG on admission benign Telemetry benign with no arrhythmias or heart block Labs ok She has no chronic cough, asthma or COPD. CXR NAD. D dimer just came back elevated 3.41 CTA not ordered yet. Troponin flat and not significant 17-40-33. Labs otherwise remarkable for TSH 5.5   ROS All other systems reviewed and negative except as noted above  Past Medical History:  Diagnosis Date   Anemia    Anxiety    Arthritis    "hands" (05/09/2015)   GERD (gastroesophageal reflux disease)    High cholesterol    History of blood transfusion 1960s; 05/08/2015   "when I had my kids; low HgB"   Hypertension    Osteoarthritis    Pneumonia 05/2004    Family History  Problem Relation Age of Onset   Heart disease Mother        died at age 18   Breast cancer Sister    Cancer Sister        Breast   Hypertension Father    Hypertension Sister     Social History   Socioeconomic History   Marital status: Widowed    Spouse name: Chrissie Noa   Number of children: 4   Years of education: 12   Highest education level: High school graduate  Occupational History   Occupation: Retired     Comment: Environmental consultant strain: Not hard at [employer] insecurity    Worry: Never true    Inability: Never true   Transportation needs    Medical: No    Non-medical: No  Tobacco  Use   Smoking status: Former Smoker    Packs/day: 0.30    Years: 8.00    Pack years: 2.40    Types: Cigarettes    Quit date: 05/30/2004    Years since quitting: 14.4   Smokeless tobacco: Never Used  Substance and Sexual Activity   Alcohol use: No    Alcohol/week: 0.0 standard drinks   Drug use: Never   Sexual activity: Not Currently  Lifestyle   Physical activity    Days per week: 0 days    Minutes per session: 0 min   Stress: Not at all  Relationships   Social connections    Talks on phone: More than three times a week    Gets together: More than three times a week    Attends religious service: More than 4 times per year    Active member of club or organization: Yes    Attends meetings of clubs or organizations: Never    Relationship status: Widowed   Intimate partner violence    Fear of current or ex partner: No    Emotionally abused: No    Physically abused: No    Forced sexual activity: No  Other Topics Concern   Not on file  Social History Narrative   Emergency Contact: [name] [phone]  End of Life Plan: gave pt ad pamplet   Any pets: 1 Dog    Diet: pt has a varied diet, low consumption of carbs and fatty foods    Exercise: pt does not have a regular exercise routine    Seatbelts: pt wears seat regularly in car   Hobbies: spending time with grandchildren      *Updated 09/26/2018*   Patients daughter [name] lives with her and assists her in driving to doctors apts and errands. Patient does have a renewed drivers licenses, but feels more comfortable riding with her daughter. Patient enjoys going out to eat and spending time with her grandchildren who live in [place]. Patient stated they come during the summer to see her. Patient is excited her son, Vicente Males, is about to finish school to become a Theme park manager.    The patient has great family support system.     Past Surgical History:  Procedure Laterality Date   BIOPSY  10/28/2018   Procedure: BIOPSY;   Surgeon: Juanita Craver, MD;  Location: [place];  Service: Endoscopy;;   CARDIAC CATHETERIZATION  05/2004   COLONOSCOPY  07/2002   Archie Endo 09/02/2010   COLONOSCOPY WITH PROPOFOL N/A 10/28/2018   Procedure: COLONOSCOPY WITH PROPOFOL;  Surgeon: Juanita Craver, MD;  Location: [facility] ENDOSCOPY;  Service: Endoscopy;  Laterality: N/A;   ESOPHAGOGASTRODUODENOSCOPY  07/2002   w/biopsies/notes 09/02/2010   ESOPHAGOGASTRODUODENOSCOPY (EGD) WITH PROPOFOL N/A 10/28/2018   Procedure: ESOPHAGOGASTRODUODENOSCOPY (EGD) WITH PROPOFOL;  Surgeon: Juanita Craver, MD;  Location: [facility] ENDOSCOPY;  Service: Endoscopy;  Laterality: N/A;   HOT HEMOSTASIS N/A 10/28/2018   Procedure: HOT HEMOSTASIS (ARGON PLASMA COAGULATION/BICAP);  Surgeon: Juanita Craver, MD;  Location: [facility] ENDOSCOPY;  Service: Endoscopy;  Laterality: N/A;   IR ANGIOGRAM SELECTIVE EACH ADDITIONAL VESSEL  10/30/2018   IR ANGIOGRAM SELECTIVE EACH ADDITIONAL VESSEL  10/30/2018   IR ANGIOGRAM SELECTIVE EACH ADDITIONAL VESSEL  10/30/2018   IR ANGIOGRAM SELECTIVE EACH ADDITIONAL VESSEL  10/30/2018   IR ANGIOGRAM VISCERAL SELECTIVE  10/30/2018   IR EMBO ART  VEN HEMORR LYMPH EXTRAV  INC GUIDE ROADMAPPING  10/30/2018   IR US GUIDANCE  10/30/2018   POLYPECTOMY  10/28/2018   Procedure: POLYPECTOMY;  Surgeon: Juanita Craver, MD;  Location: [facility] ENDOSCOPY;  Service: Endoscopy;;      atorvastatin  40 mg Oral Daily   carvedilol  12.5 mg Oral BID WC   feeding supplement (ENSURE ENLIVE)  237 mL Oral BID BM   insulin aspart  0-9 Units Subcutaneous TID WC   pantoprazole  40 mg Oral Daily   sertraline  50 mg Oral Daily     Physical Exam: Blood pressure (!) 102/38, pulse 70, temperature (!) 97.5 F (36.4 C), temperature source Oral, resp. rate 20, height '5\' 2"'  (1.575 m), weight 76.3 kg, SpO2 96 %.   Affect appropriate Healthy:  appears stated age 73: normal Neck supple with no adenopathy JVP normal no bruits no thyromegaly Lungs clear with no wheezing and good  diaphragmatic motion Heart:  S1/S2 no murmur, no rub, gallop or click PMI normal Abdomen: benighn, BS positve, no tenderness, no AAA no bruit.  No HSM or HJR Distal pulses intact with no bruits No edema Neuro non-focal Skin warm and dry No muscular weakness   Labs:   Lab Results  Component Value Date   WBC 13.0 (H) 11/06/2018   HGB 9.9 (L) 11/06/2018   HCT 34.7 (L) 11/06/2018   MCV 93.3 11/06/2018   PLT 449 (H) 11/06/2018    Recent Labs  Lab 11/06/18 0157  NA 137  K 4.8  CL 103  CO2 19*  BUN 28*  CREATININE 1.50*  CALCIUM 8.3*  GLUCOSE 257*   No results found for: CKTOTAL, CKMB, CKMBINDEX, TROPONINI  Lab Results  Component Value Date   CHOL 231 (H) 10/14/2016   CHOL 254 (H) 09/06/2013   CHOL 215 (H) 02/08/2009   Lab Results  Component Value Date   HDL 52 10/14/2016   HDL 48 09/06/2013   HDL 47 02/08/2009   Lab Results  Component Value Date   LDLCALC 151 (H) 10/14/2016   LDLCALC 177 (H) 09/06/2013   LDLCALC 142 (H) 02/08/2009   Lab Results  Component Value Date   TRIG 142 10/14/2016   TRIG 145 09/06/2013   TRIG 130 02/08/2009   Lab Results  Component Value Date   CHOLHDL 4.4 10/14/2016   CHOLHDL 5.3 09/06/2013   CHOLHDL 4.6 Ratio 02/08/2009   No results found for: LDLDIRECT    Radiology: Ir Angiogram Visceral Selective  Result Date: 10/30/2018 INDICATION: Recent endoscopy with active extravasation from duodenal polyp with subsequent CTA demonstrating focal ectasia of the distal trifurcation of the GDA. Request made for mesenteric arteriogram and potential percutaneous coil embolization. EXAM: 1. ULTRASOUND GUIDANCE FOR ARTERIAL ACCESS 2. CELIAC ARTERIOGRAM (1st ORDER) 3. SELECTIVE GASTRO EPIPLOIC ARTERIOGRAM AND PERCUTANEOUS COIL EMBOLIZATION. 4. SELECTIVE ANTERIOR AND POSTERIOR DIVISIONS OF THE PANCREATICODUODENAL ARTERY AND PERCUTANEOUS COIL EMBOLIZATION. 5. SELECTIVE GASTRODUODENAL ARTERIOGRAM AND PERCUTANEOUS COIL EMBOLIZATION COMPARISON:  CTA  abdomen and pelvis - 10/29/2018 MEDICATIONS: None ANESTHESIA/SEDATION: Moderate (conscious) sedation was employed during this procedure. A total of Versed 1 mg and Fentanyl 50 mcg was administered intravenously. Moderate Sedation Time: 70 minutes. The patient's level of consciousness and vital signs were monitored continuously by radiology nursing throughout the procedure under my direct supervision. CONTRAST:  75 cc Isovue-300 FLUOROSCOPY TIME:  20 minutes, 36 seconds (9,169 mGy) COMPLICATIONS: None immediate. PROCEDURE: Informed consent was obtained from the patient's daughter following explanation of the procedure, risks, benefits and alternatives. The patient understands, agrees and consents for the procedure. All questions were addressed. A time out was performed prior to the initiation of the procedure. Maximal barrier sterile technique utilized including caps, mask, sterile gowns, sterile gloves, large sterile drape, hand hygiene, and Betadine prep. The right femoral head was marked fluoroscopically. Under sterile conditions and local anesthesia, the right common femoral artery access was performed with a micropuncture needle. Under direct ultrasound guidance, the right common femoral was accessed with a micropuncture kit. An ultrasound image was saved for documentation purposes. This allowed for placement of a 5-French vascular sheath. A limited arteriogram was performed through the side arm of the sheath confirming appropriate access within the right common femoral artery. Mickelson catheter was advanced to the level of the thoracic aorta where was reformed, back bled and flushed. Mickelson catheter was then utilized to select the celiac artery and a selective celiac arteriogram was performed. With the use of a fathom 14 microwire, a regular Renegade microcatheter was advanced into the gastroduodenal artery and a selective gastroduodenal arteriogram was performed. Next, the microcatheter was advanced into  the posterior division of the pancreaticoduodenal artery. Selective arteriogram was performed and the vessel was percutaneously coil embolized to near its origin with multiple overlapping 4, 5 and 6 mm diameter interlock coils. Next, the microcatheter was advanced into the gastroepiploic artery and a selective gastroepiploic arteriogram was performed. The vessel within percutaneously coil embolized to near its origin with multiple overlapping 6 mm diameter  interlock coils. During deployment of the final coil, there was difficulty removing the pushing wire, ultimately resulting in damage to the microcatheter requiring replacement. With the use of a fathom 14 microwire, microcatheter was utilized to select the anterior division of the pancreaticoduodenal artery. Selective arteriogram was performed and the vessel was percutaneously coil embolized to near its origin with multiple overlapping 4 and 5 mm diameter interlock coils. Next, the microcatheter was retracted into the distal aspect of the GDA and a selective arteriogram was performed. The focally ectatic segment of the distal aspect of the GDA and it's distal aspect was then percutaneously coil embolized with multiple overlapping 8 mm and 6 mm diameter interlock coils to the level of its more proximal branch division. The microcatheter was retracted into the proximal GDA as well as the common hepatic artery, and completion arteriograms were performed Images were reviewed and the procedure was terminated. At this point, all wires, catheters and sheaths were removed from the patient. Hemostasis was achieved at the right groin access site with deployment of an ExoSeal closure device and manual compression. The patient tolerated the procedure well without immediate post procedural complication. FINDINGS: Selective celiac arteriogram demonstrates conventional takeoff of the GDA. Redemonstrated focally ectatic segment of the distal aspect of the GDA at the level of its  trifurcation as was seen on preceding CTA. Note is again made of additional branch of the GDA at its mid aspect however this vessel does not contribute to the ectatic portion of the more distal aspect of the GDA. Following selective arteriogram and percutaneous coil embolization of the gastroepiploic, the anterior and posterior divisions of the pancreaticoduodenal artery and the distal aspect of the GDA, there is complete occlusion of the ectatic segment of the GDA without persistent flow. The more proximal branch of the GDA, not contributing to the distal ectatic segment, remains patent. IMPRESSION: Technically successful percutaneous coil embolization of ectatic segment of the distal GDA at the level of its trifurcation. PLAN: - Continued conservative management with daily CBCs as per the primary team. - Repeat endoscopy may be performed at the discretion GI service as clinically indicated. Electronically Signed   By: Sandi Mariscal M.D.   On: 10/30/2018 11:54   Ir Angiogram Selective Each Additional Vessel  Result Date: 10/30/2018 INDICATION: Recent endoscopy with active extravasation from duodenal polyp with subsequent CTA demonstrating focal ectasia of the distal trifurcation of the GDA. Request made for mesenteric arteriogram and potential percutaneous coil embolization. EXAM: 1. ULTRASOUND GUIDANCE FOR ARTERIAL ACCESS 2. CELIAC ARTERIOGRAM (1st ORDER) 3. SELECTIVE GASTRO EPIPLOIC ARTERIOGRAM AND PERCUTANEOUS COIL EMBOLIZATION. 4. SELECTIVE ANTERIOR AND POSTERIOR DIVISIONS OF THE PANCREATICODUODENAL ARTERY AND PERCUTANEOUS COIL EMBOLIZATION. 5. SELECTIVE GASTRODUODENAL ARTERIOGRAM AND PERCUTANEOUS COIL EMBOLIZATION COMPARISON:  CTA abdomen and pelvis - 10/29/2018 MEDICATIONS: None ANESTHESIA/SEDATION: Moderate (conscious) sedation was employed during this procedure. A total of Versed 1 mg and Fentanyl 50 mcg was administered intravenously. Moderate Sedation Time: 70 minutes. The patient's level of  consciousness and vital signs were monitored continuously by radiology nursing throughout the procedure under my direct supervision. CONTRAST:  75 cc Isovue-300 FLUOROSCOPY TIME:  20 minutes, 36 seconds (5,456 mGy) COMPLICATIONS: None immediate. PROCEDURE: Informed consent was obtained from the patient's daughter following explanation of the procedure, risks, benefits and alternatives. The patient understands, agrees and consents for the procedure. All questions were addressed. A time out was performed prior to the initiation of the procedure. Maximal barrier sterile technique utilized including caps, mask, sterile gowns, sterile gloves, large sterile  drape, hand hygiene, and Betadine prep. The right femoral head was marked fluoroscopically. Under sterile conditions and local anesthesia, the right common femoral artery access was performed with a micropuncture needle. Under direct ultrasound guidance, the right common femoral was accessed with a micropuncture kit. An ultrasound image was saved for documentation purposes. This allowed for placement of a 5-French vascular sheath. A limited arteriogram was performed through the side arm of the sheath confirming appropriate access within the right common femoral artery. Mickelson catheter was advanced to the level of the thoracic aorta where was reformed, back bled and flushed. Mickelson catheter was then utilized to select the celiac artery and a selective celiac arteriogram was performed. With the use of a fathom 14 microwire, a regular Renegade microcatheter was advanced into the gastroduodenal artery and a selective gastroduodenal arteriogram was performed. Next, the microcatheter was advanced into the posterior division of the pancreaticoduodenal artery. Selective arteriogram was performed and the vessel was percutaneously coil embolized to near its origin with multiple overlapping 4, 5 and 6 mm diameter interlock coils. Next, the microcatheter was advanced into  the gastroepiploic artery and a selective gastroepiploic arteriogram was performed. The vessel within percutaneously coil embolized to near its origin with multiple overlapping 6 mm diameter interlock coils. During deployment of the final coil, there was difficulty removing the pushing wire, ultimately resulting in damage to the microcatheter requiring replacement. With the use of a fathom 14 microwire, microcatheter was utilized to select the anterior division of the pancreaticoduodenal artery. Selective arteriogram was performed and the vessel was percutaneously coil embolized to near its origin with multiple overlapping 4 and 5 mm diameter interlock coils. Next, the microcatheter was retracted into the distal aspect of the GDA and a selective arteriogram was performed. The focally ectatic segment of the distal aspect of the GDA and it's distal aspect was then percutaneously coil embolized with multiple overlapping 8 mm and 6 mm diameter interlock coils to the level of its more proximal branch division. The microcatheter was retracted into the proximal GDA as well as the common hepatic artery, and completion arteriograms were performed Images were reviewed and the procedure was terminated. At this point, all wires, catheters and sheaths were removed from the patient. Hemostasis was achieved at the right groin access site with deployment of an ExoSeal closure device and manual compression. The patient tolerated the procedure well without immediate post procedural complication. FINDINGS: Selective celiac arteriogram demonstrates conventional takeoff of the GDA. Redemonstrated focally ectatic segment of the distal aspect of the GDA at the level of its trifurcation as was seen on preceding CTA. Note is again made of additional branch of the GDA at its mid aspect however this vessel does not contribute to the ectatic portion of the more distal aspect of the GDA. Following selective arteriogram and percutaneous coil  embolization of the gastroepiploic, the anterior and posterior divisions of the pancreaticoduodenal artery and the distal aspect of the GDA, there is complete occlusion of the ectatic segment of the GDA without persistent flow. The more proximal branch of the GDA, not contributing to the distal ectatic segment, remains patent. IMPRESSION: Technically successful percutaneous coil embolization of ectatic segment of the distal GDA at the level of its trifurcation. PLAN: - Continued conservative management with daily CBCs as per the primary team. - Repeat endoscopy may be performed at the discretion GI service as clinically indicated. Electronically Signed   By: Sandi Mariscal M.D.   On: 10/30/2018 11:54   Ir Angiogram Selective  Each Additional Vessel  Result Date: 10/30/2018 INDICATION: Recent endoscopy with active extravasation from duodenal polyp with subsequent CTA demonstrating focal ectasia of the distal trifurcation of the GDA. Request made for mesenteric arteriogram and potential percutaneous coil embolization. EXAM: 1. ULTRASOUND GUIDANCE FOR ARTERIAL ACCESS 2. CELIAC ARTERIOGRAM (1st ORDER) 3. SELECTIVE GASTRO EPIPLOIC ARTERIOGRAM AND PERCUTANEOUS COIL EMBOLIZATION. 4. SELECTIVE ANTERIOR AND POSTERIOR DIVISIONS OF THE PANCREATICODUODENAL ARTERY AND PERCUTANEOUS COIL EMBOLIZATION. 5. SELECTIVE GASTRODUODENAL ARTERIOGRAM AND PERCUTANEOUS COIL EMBOLIZATION COMPARISON:  CTA abdomen and pelvis - 10/29/2018 MEDICATIONS: None ANESTHESIA/SEDATION: Moderate (conscious) sedation was employed during this procedure. A total of Versed 1 mg and Fentanyl 50 mcg was administered intravenously. Moderate Sedation Time: 70 minutes. The patient's level of consciousness and vital signs were monitored continuously by radiology nursing throughout the procedure under my direct supervision. CONTRAST:  75 cc Isovue-300 FLUOROSCOPY TIME:  20 minutes, 36 seconds (3,151 mGy) COMPLICATIONS: None immediate. PROCEDURE: Informed consent was  obtained from the patient's daughter following explanation of the procedure, risks, benefits and alternatives. The patient understands, agrees and consents for the procedure. All questions were addressed. A time out was performed prior to the initiation of the procedure. Maximal barrier sterile technique utilized including caps, mask, sterile gowns, sterile gloves, large sterile drape, hand hygiene, and Betadine prep. The right femoral head was marked fluoroscopically. Under sterile conditions and local anesthesia, the right common femoral artery access was performed with a micropuncture needle. Under direct ultrasound guidance, the right common femoral was accessed with a micropuncture kit. An ultrasound image was saved for documentation purposes. This allowed for placement of a 5-French vascular sheath. A limited arteriogram was performed through the side arm of the sheath confirming appropriate access within the right common femoral artery. Mickelson catheter was advanced to the level of the thoracic aorta where was reformed, back bled and flushed. Mickelson catheter was then utilized to select the celiac artery and a selective celiac arteriogram was performed. With the use of a fathom 14 microwire, a regular Renegade microcatheter was advanced into the gastroduodenal artery and a selective gastroduodenal arteriogram was performed. Next, the microcatheter was advanced into the posterior division of the pancreaticoduodenal artery. Selective arteriogram was performed and the vessel was percutaneously coil embolized to near its origin with multiple overlapping 4, 5 and 6 mm diameter interlock coils. Next, the microcatheter was advanced into the gastroepiploic artery and a selective gastroepiploic arteriogram was performed. The vessel within percutaneously coil embolized to near its origin with multiple overlapping 6 mm diameter interlock coils. During deployment of the final coil, there was difficulty removing the  pushing wire, ultimately resulting in damage to the microcatheter requiring replacement. With the use of a fathom 14 microwire, microcatheter was utilized to select the anterior division of the pancreaticoduodenal artery. Selective arteriogram was performed and the vessel was percutaneously coil embolized to near its origin with multiple overlapping 4 and 5 mm diameter interlock coils. Next, the microcatheter was retracted into the distal aspect of the GDA and a selective arteriogram was performed. The focally ectatic segment of the distal aspect of the GDA and it's distal aspect was then percutaneously coil embolized with multiple overlapping 8 mm and 6 mm diameter interlock coils to the level of its more proximal branch division. The microcatheter was retracted into the proximal GDA as well as the common hepatic artery, and completion arteriograms were performed Images were reviewed and the procedure was terminated. At this point, all wires, catheters and sheaths were removed from the patient. Hemostasis was  achieved at the right groin access site with deployment of an ExoSeal closure device and manual compression. The patient tolerated the procedure well without immediate post procedural complication. FINDINGS: Selective celiac arteriogram demonstrates conventional takeoff of the GDA. Redemonstrated focally ectatic segment of the distal aspect of the GDA at the level of its trifurcation as was seen on preceding CTA. Note is again made of additional branch of the GDA at its mid aspect however this vessel does not contribute to the ectatic portion of the more distal aspect of the GDA. Following selective arteriogram and percutaneous coil embolization of the gastroepiploic, the anterior and posterior divisions of the pancreaticoduodenal artery and the distal aspect of the GDA, there is complete occlusion of the ectatic segment of the GDA without persistent flow. The more proximal branch of the GDA, not contributing  to the distal ectatic segment, remains patent. IMPRESSION: Technically successful percutaneous coil embolization of ectatic segment of the distal GDA at the level of its trifurcation. PLAN: - Continued conservative management with daily CBCs as per the primary team. - Repeat endoscopy may be performed at the discretion GI service as clinically indicated. Electronically Signed   By: Sandi Mariscal M.D.   On: 10/30/2018 11:54   Ir Angiogram Selective Each Additional Vessel  Result Date: 10/30/2018 INDICATION: Recent endoscopy with active extravasation from duodenal polyp with subsequent CTA demonstrating focal ectasia of the distal trifurcation of the GDA. Request made for mesenteric arteriogram and potential percutaneous coil embolization. EXAM: 1. ULTRASOUND GUIDANCE FOR ARTERIAL ACCESS 2. CELIAC ARTERIOGRAM (1st ORDER) 3. SELECTIVE GASTRO EPIPLOIC ARTERIOGRAM AND PERCUTANEOUS COIL EMBOLIZATION. 4. SELECTIVE ANTERIOR AND POSTERIOR DIVISIONS OF THE PANCREATICODUODENAL ARTERY AND PERCUTANEOUS COIL EMBOLIZATION. 5. SELECTIVE GASTRODUODENAL ARTERIOGRAM AND PERCUTANEOUS COIL EMBOLIZATION COMPARISON:  CTA abdomen and pelvis - 10/29/2018 MEDICATIONS: None ANESTHESIA/SEDATION: Moderate (conscious) sedation was employed during this procedure. A total of Versed 1 mg and Fentanyl 50 mcg was administered intravenously. Moderate Sedation Time: 70 minutes. The patient's level of consciousness and vital signs were monitored continuously by radiology nursing throughout the procedure under my direct supervision. CONTRAST:  75 cc Isovue-300 FLUOROSCOPY TIME:  20 minutes, 36 seconds (6,834 mGy) COMPLICATIONS: None immediate. PROCEDURE: Informed consent was obtained from the patient's daughter following explanation of the procedure, risks, benefits and alternatives. The patient understands, agrees and consents for the procedure. All questions were addressed. A time out was performed prior to the initiation of the procedure. Maximal  barrier sterile technique utilized including caps, mask, sterile gowns, sterile gloves, large sterile drape, hand hygiene, and Betadine prep. The right femoral head was marked fluoroscopically. Under sterile conditions and local anesthesia, the right common femoral artery access was performed with a micropuncture needle. Under direct ultrasound guidance, the right common femoral was accessed with a micropuncture kit. An ultrasound image was saved for documentation purposes. This allowed for placement of a 5-French vascular sheath. A limited arteriogram was performed through the side arm of the sheath confirming appropriate access within the right common femoral artery. Mickelson catheter was advanced to the level of the thoracic aorta where was reformed, back bled and flushed. Mickelson catheter was then utilized to select the celiac artery and a selective celiac arteriogram was performed. With the use of a fathom 14 microwire, a regular Renegade microcatheter was advanced into the gastroduodenal artery and a selective gastroduodenal arteriogram was performed. Next, the microcatheter was advanced into the posterior division of the pancreaticoduodenal artery. Selective arteriogram was performed and the vessel was percutaneously coil embolized to near its  origin with multiple overlapping 4, 5 and 6 mm diameter interlock coils. Next, the microcatheter was advanced into the gastroepiploic artery and a selective gastroepiploic arteriogram was performed. The vessel within percutaneously coil embolized to near its origin with multiple overlapping 6 mm diameter interlock coils. During deployment of the final coil, there was difficulty removing the pushing wire, ultimately resulting in damage to the microcatheter requiring replacement. With the use of a fathom 14 microwire, microcatheter was utilized to select the anterior division of the pancreaticoduodenal artery. Selective arteriogram was performed and the vessel was  percutaneously coil embolized to near its origin with multiple overlapping 4 and 5 mm diameter interlock coils. Next, the microcatheter was retracted into the distal aspect of the GDA and a selective arteriogram was performed. The focally ectatic segment of the distal aspect of the GDA and it's distal aspect was then percutaneously coil embolized with multiple overlapping 8 mm and 6 mm diameter interlock coils to the level of its more proximal branch division. The microcatheter was retracted into the proximal GDA as well as the common hepatic artery, and completion arteriograms were performed Images were reviewed and the procedure was terminated. At this point, all wires, catheters and sheaths were removed from the patient. Hemostasis was achieved at the right groin access site with deployment of an ExoSeal closure device and manual compression. The patient tolerated the procedure well without immediate post procedural complication. FINDINGS: Selective celiac arteriogram demonstrates conventional takeoff of the GDA. Redemonstrated focally ectatic segment of the distal aspect of the GDA at the level of its trifurcation as was seen on preceding CTA. Note is again made of additional branch of the GDA at its mid aspect however this vessel does not contribute to the ectatic portion of the more distal aspect of the GDA. Following selective arteriogram and percutaneous coil embolization of the gastroepiploic, the anterior and posterior divisions of the pancreaticoduodenal artery and the distal aspect of the GDA, there is complete occlusion of the ectatic segment of the GDA without persistent flow. The more proximal branch of the GDA, not contributing to the distal ectatic segment, remains patent. IMPRESSION: Technically successful percutaneous coil embolization of ectatic segment of the distal GDA at the level of its trifurcation. PLAN: - Continued conservative management with daily CBCs as per the primary team. - Repeat  endoscopy may be performed at the discretion GI service as clinically indicated. Electronically Signed   By: Sandi Mariscal M.D.   On: 10/30/2018 11:54   Ir Angiogram Selective Each Additional Vessel  Result Date: 10/30/2018 INDICATION: Recent endoscopy with active extravasation from duodenal polyp with subsequent CTA demonstrating focal ectasia of the distal trifurcation of the GDA. Request made for mesenteric arteriogram and potential percutaneous coil embolization. EXAM: 1. ULTRASOUND GUIDANCE FOR ARTERIAL ACCESS 2. CELIAC ARTERIOGRAM (1st ORDER) 3. SELECTIVE GASTRO EPIPLOIC ARTERIOGRAM AND PERCUTANEOUS COIL EMBOLIZATION. 4. SELECTIVE ANTERIOR AND POSTERIOR DIVISIONS OF THE PANCREATICODUODENAL ARTERY AND PERCUTANEOUS COIL EMBOLIZATION. 5. SELECTIVE GASTRODUODENAL ARTERIOGRAM AND PERCUTANEOUS COIL EMBOLIZATION COMPARISON:  CTA abdomen and pelvis - 10/29/2018 MEDICATIONS: None ANESTHESIA/SEDATION: Moderate (conscious) sedation was employed during this procedure. A total of Versed 1 mg and Fentanyl 50 mcg was administered intravenously. Moderate Sedation Time: 70 minutes. The patient's level of consciousness and vital signs were monitored continuously by radiology nursing throughout the procedure under my direct supervision. CONTRAST:  75 cc Isovue-300 FLUOROSCOPY TIME:  20 minutes, 36 seconds (3,846 mGy) COMPLICATIONS: None immediate. PROCEDURE: Informed consent was obtained from the patient's daughter following explanation of the  procedure, risks, benefits and alternatives. The patient understands, agrees and consents for the procedure. All questions were addressed. A time out was performed prior to the initiation of the procedure. Maximal barrier sterile technique utilized including caps, mask, sterile gowns, sterile gloves, large sterile drape, hand hygiene, and Betadine prep. The right femoral head was marked fluoroscopically. Under sterile conditions and local anesthesia, the right common femoral artery  access was performed with a micropuncture needle. Under direct ultrasound guidance, the right common femoral was accessed with a micropuncture kit. An ultrasound image was saved for documentation purposes. This allowed for placement of a 5-French vascular sheath. A limited arteriogram was performed through the side arm of the sheath confirming appropriate access within the right common femoral artery. Mickelson catheter was advanced to the level of the thoracic aorta where was reformed, back bled and flushed. Mickelson catheter was then utilized to select the celiac artery and a selective celiac arteriogram was performed. With the use of a fathom 14 microwire, a regular Renegade microcatheter was advanced into the gastroduodenal artery and a selective gastroduodenal arteriogram was performed. Next, the microcatheter was advanced into the posterior division of the pancreaticoduodenal artery. Selective arteriogram was performed and the vessel was percutaneously coil embolized to near its origin with multiple overlapping 4, 5 and 6 mm diameter interlock coils. Next, the microcatheter was advanced into the gastroepiploic artery and a selective gastroepiploic arteriogram was performed. The vessel within percutaneously coil embolized to near its origin with multiple overlapping 6 mm diameter interlock coils. During deployment of the final coil, there was difficulty removing the pushing wire, ultimately resulting in damage to the microcatheter requiring replacement. With the use of a fathom 14 microwire, microcatheter was utilized to select the anterior division of the pancreaticoduodenal artery. Selective arteriogram was performed and the vessel was percutaneously coil embolized to near its origin with multiple overlapping 4 and 5 mm diameter interlock coils. Next, the microcatheter was retracted into the distal aspect of the GDA and a selective arteriogram was performed. The focally ectatic segment of the distal aspect  of the GDA and it's distal aspect was then percutaneously coil embolized with multiple overlapping 8 mm and 6 mm diameter interlock coils to the level of its more proximal branch division. The microcatheter was retracted into the proximal GDA as well as the common hepatic artery, and completion arteriograms were performed Images were reviewed and the procedure was terminated. At this point, all wires, catheters and sheaths were removed from the patient. Hemostasis was achieved at the right groin access site with deployment of an ExoSeal closure device and manual compression. The patient tolerated the procedure well without immediate post procedural complication. FINDINGS: Selective celiac arteriogram demonstrates conventional takeoff of the GDA. Redemonstrated focally ectatic segment of the distal aspect of the GDA at the level of its trifurcation as was seen on preceding CTA. Note is again made of additional branch of the GDA at its mid aspect however this vessel does not contribute to the ectatic portion of the more distal aspect of the GDA. Following selective arteriogram and percutaneous coil embolization of the gastroepiploic, the anterior and posterior divisions of the pancreaticoduodenal artery and the distal aspect of the GDA, there is complete occlusion of the ectatic segment of the GDA without persistent flow. The more proximal branch of the GDA, not contributing to the distal ectatic segment, remains patent. IMPRESSION: Technically successful percutaneous coil embolization of ectatic segment of the distal GDA at the level of its trifurcation. PLAN: -  Continued conservative management with daily CBCs as per the primary team. - Repeat endoscopy may be performed at the discretion GI service as clinically indicated. Electronically Signed   By: Sandi Mariscal M.D.   On: 10/30/2018 11:54   Ir US Guidance  Result Date: 10/30/2018 INDICATION: Recent endoscopy with active extravasation from duodenal polyp with  subsequent CTA demonstrating focal ectasia of the distal trifurcation of the GDA. Request made for mesenteric arteriogram and potential percutaneous coil embolization. EXAM: 1. ULTRASOUND GUIDANCE FOR ARTERIAL ACCESS 2. CELIAC ARTERIOGRAM (1st ORDER) 3. SELECTIVE GASTRO EPIPLOIC ARTERIOGRAM AND PERCUTANEOUS COIL EMBOLIZATION. 4. SELECTIVE ANTERIOR AND POSTERIOR DIVISIONS OF THE PANCREATICODUODENAL ARTERY AND PERCUTANEOUS COIL EMBOLIZATION. 5. SELECTIVE GASTRODUODENAL ARTERIOGRAM AND PERCUTANEOUS COIL EMBOLIZATION COMPARISON:  CTA abdomen and pelvis - 10/29/2018 MEDICATIONS: None ANESTHESIA/SEDATION: Moderate (conscious) sedation was employed during this procedure. A total of Versed 1 mg and Fentanyl 50 mcg was administered intravenously. Moderate Sedation Time: 70 minutes. The patient's level of consciousness and vital signs were monitored continuously by radiology nursing throughout the procedure under my direct supervision. CONTRAST:  75 cc Isovue-300 FLUOROSCOPY TIME:  20 minutes, 36 seconds (8,938 mGy) COMPLICATIONS: None immediate. PROCEDURE: Informed consent was obtained from the patient's daughter following explanation of the procedure, risks, benefits and alternatives. The patient understands, agrees and consents for the procedure. All questions were addressed. A time out was performed prior to the initiation of the procedure. Maximal barrier sterile technique utilized including caps, mask, sterile gowns, sterile gloves, large sterile drape, hand hygiene, and Betadine prep. The right femoral head was marked fluoroscopically. Under sterile conditions and local anesthesia, the right common femoral artery access was performed with a micropuncture needle. Under direct ultrasound guidance, the right common femoral was accessed with a micropuncture kit. An ultrasound image was saved for documentation purposes. This allowed for placement of a 5-French vascular sheath. A limited arteriogram was performed through the  side arm of the sheath confirming appropriate access within the right common femoral artery. Mickelson catheter was advanced to the level of the thoracic aorta where was reformed, back bled and flushed. Mickelson catheter was then utilized to select the celiac artery and a selective celiac arteriogram was performed. With the use of a fathom 14 microwire, a regular Renegade microcatheter was advanced into the gastroduodenal artery and a selective gastroduodenal arteriogram was performed. Next, the microcatheter was advanced into the posterior division of the pancreaticoduodenal artery. Selective arteriogram was performed and the vessel was percutaneously coil embolized to near its origin with multiple overlapping 4, 5 and 6 mm diameter interlock coils. Next, the microcatheter was advanced into the gastroepiploic artery and a selective gastroepiploic arteriogram was performed. The vessel within percutaneously coil embolized to near its origin with multiple overlapping 6 mm diameter interlock coils. During deployment of the final coil, there was difficulty removing the pushing wire, ultimately resulting in damage to the microcatheter requiring replacement. With the use of a fathom 14 microwire, microcatheter was utilized to select the anterior division of the pancreaticoduodenal artery. Selective arteriogram was performed and the vessel was percutaneously coil embolized to near its origin with multiple overlapping 4 and 5 mm diameter interlock coils. Next, the microcatheter was retracted into the distal aspect of the GDA and a selective arteriogram was performed. The focally ectatic segment of the distal aspect of the GDA and it's distal aspect was then percutaneously coil embolized with multiple overlapping 8 mm and 6 mm diameter interlock coils to the level of its more proximal branch division. The microcatheter  was retracted into the proximal GDA as well as the common hepatic artery, and completion arteriograms were  performed Images were reviewed and the procedure was terminated. At this point, all wires, catheters and sheaths were removed from the patient. Hemostasis was achieved at the right groin access site with deployment of an ExoSeal closure device and manual compression. The patient tolerated the procedure well without immediate post procedural complication. FINDINGS: Selective celiac arteriogram demonstrates conventional takeoff of the GDA. Redemonstrated focally ectatic segment of the distal aspect of the GDA at the level of its trifurcation as was seen on preceding CTA. Note is again made of additional branch of the GDA at its mid aspect however this vessel does not contribute to the ectatic portion of the more distal aspect of the GDA. Following selective arteriogram and percutaneous coil embolization of the gastroepiploic, the anterior and posterior divisions of the pancreaticoduodenal artery and the distal aspect of the GDA, there is complete occlusion of the ectatic segment of the GDA without persistent flow. The more proximal branch of the GDA, not contributing to the distal ectatic segment, remains patent. IMPRESSION: Technically successful percutaneous coil embolization of ectatic segment of the distal GDA at the level of its trifurcation. PLAN: - Continued conservative management with daily CBCs as per the primary team. - Repeat endoscopy may be performed at the discretion GI service as clinically indicated. Electronically Signed   By: Sandi Mariscal M.D.   On: 10/30/2018 11:54   Dg Chest Port 1 View  Result Date: 11/06/2018 CLINICAL DATA:  Shortness of breath. EXAM: PORTABLE CHEST 1 VIEW COMPARISON:  Radiographs 10/30/2018 FINDINGS: The heart is upper normal in size. Unchanged mediastinal contours with aortic atherosclerosis. Slight increase in pulmonary edema from prior exam with Kerley B-lines. Small pleural effusions. No pneumothorax or confluent airspace disease. IMPRESSION: Slight increase in pulmonary  edema from prior exam. Small pleural effusions. Aortic Atherosclerosis (ICD10-I70.0). Electronically Signed   By: Keith Rake M.D.   On: 11/06/2018 02:04   Dg Chest Port 1 View  Result Date: 10/30/2018 CLINICAL DATA:  Chest pain and shortness of breath. EXAM: PORTABLE CHEST 1 VIEW COMPARISON:  Single-view of the chest 10/27/2018 and 06/14/2016. FINDINGS: Pulmonary edema appears somewhat worse than on the most recent comparison. Heart size is upper normal. No pneumothorax or pleural effusion. Aortic atherosclerosis noted. No acute bony abnormality. IMPRESSION: Pulmonary edema appears somewhat worse than on the most recent comparison. No other change. Atherosclerosis. Electronically Signed   By: Inge Rise M.D.   On: 10/30/2018 16:52   Dg Chest Port 1 View  Result Date: 10/27/2018 CLINICAL DATA:  Initial evaluation for acute chest pain, shortness of breath. EXAM: PORTABLE CHEST 1 VIEW COMPARISON:  Prior radiograph from 06/14/2016 FINDINGS: Transverse heart size at the upper limits of normal. Mediastinal silhouette within normal limits. Aortic atherosclerosis. Lungs normally inflated. Mild diffuse pulmonary interstitial congestion without frank alveolar edema. Probable small right pleural effusion. No consolidative opacity. No pneumothorax. No acute osseous finding. IMPRESSION: 1. Mild diffuse pulmonary interstitial congestion without frank pulmonary edema. 2. Probable small right pleural effusion. 3. Aortic atherosclerosis. Electronically Signed   By: Jeannine Boga M.D.   On: 10/27/2018 02:10   Ir Embo Art  [facility] Fiscal Hemorr Lymph [employer] Guide Roadmapping  Result Date: 10/30/2018 INDICATION: Recent endoscopy with active extravasation from duodenal polyp with subsequent CTA demonstrating focal ectasia of the distal trifurcation of the GDA. Request made for mesenteric arteriogram and potential percutaneous coil embolization. EXAM: 1. ULTRASOUND GUIDANCE FOR  ARTERIAL ACCESS 2. CELIAC  ARTERIOGRAM (1st ORDER) 3. SELECTIVE GASTRO EPIPLOIC ARTERIOGRAM AND PERCUTANEOUS COIL EMBOLIZATION. 4. SELECTIVE ANTERIOR AND POSTERIOR DIVISIONS OF THE PANCREATICODUODENAL ARTERY AND PERCUTANEOUS COIL EMBOLIZATION. 5. SELECTIVE GASTRODUODENAL ARTERIOGRAM AND PERCUTANEOUS COIL EMBOLIZATION COMPARISON:  CTA abdomen and pelvis - 10/29/2018 MEDICATIONS: None ANESTHESIA/SEDATION: Moderate (conscious) sedation was employed during this procedure. A total of Versed 1 mg and Fentanyl 50 mcg was administered intravenously. Moderate Sedation Time: 70 minutes. The patient's level of consciousness and vital signs were monitored continuously by radiology nursing throughout the procedure under my direct supervision. CONTRAST:  75 cc Isovue-300 FLUOROSCOPY TIME:  20 minutes, 36 seconds (7,782 mGy) COMPLICATIONS: None immediate. PROCEDURE: Informed consent was obtained from the patient's daughter following explanation of the procedure, risks, benefits and alternatives. The patient understands, agrees and consents for the procedure. All questions were addressed. A time out was performed prior to the initiation of the procedure. Maximal barrier sterile technique utilized including caps, mask, sterile gowns, sterile gloves, large sterile drape, hand hygiene, and Betadine prep. The right femoral head was marked fluoroscopically. Under sterile conditions and local anesthesia, the right common femoral artery access was performed with a micropuncture needle. Under direct ultrasound guidance, the right common femoral was accessed with a micropuncture kit. An ultrasound image was saved for documentation purposes. This allowed for placement of a 5-French vascular sheath. A limited arteriogram was performed through the side arm of the sheath confirming appropriate access within the right common femoral artery. Mickelson catheter was advanced to the level of the thoracic aorta where was reformed, back bled and flushed. Mickelson catheter was  then utilized to select the celiac artery and a selective celiac arteriogram was performed. With the use of a fathom 14 microwire, a regular Renegade microcatheter was advanced into the gastroduodenal artery and a selective gastroduodenal arteriogram was performed. Next, the microcatheter was advanced into the posterior division of the pancreaticoduodenal artery. Selective arteriogram was performed and the vessel was percutaneously coil embolized to near its origin with multiple overlapping 4, 5 and 6 mm diameter interlock coils. Next, the microcatheter was advanced into the gastroepiploic artery and a selective gastroepiploic arteriogram was performed. The vessel within percutaneously coil embolized to near its origin with multiple overlapping 6 mm diameter interlock coils. During deployment of the final coil, there was difficulty removing the pushing wire, ultimately resulting in damage to the microcatheter requiring replacement. With the use of a fathom 14 microwire, microcatheter was utilized to select the anterior division of the pancreaticoduodenal artery. Selective arteriogram was performed and the vessel was percutaneously coil embolized to near its origin with multiple overlapping 4 and 5 mm diameter interlock coils. Next, the microcatheter was retracted into the distal aspect of the GDA and a selective arteriogram was performed. The focally ectatic segment of the distal aspect of the GDA and it's distal aspect was then percutaneously coil embolized with multiple overlapping 8 mm and 6 mm diameter interlock coils to the level of its more proximal branch division. The microcatheter was retracted into the proximal GDA as well as the common hepatic artery, and completion arteriograms were performed Images were reviewed and the procedure was terminated. At this point, all wires, catheters and sheaths were removed from the patient. Hemostasis was achieved at the right groin access site with deployment of an  ExoSeal closure device and manual compression. The patient tolerated the procedure well without immediate post procedural complication. FINDINGS: Selective celiac arteriogram demonstrates conventional takeoff of the GDA. Redemonstrated focally ectatic segment of  the distal aspect of the GDA at the level of its trifurcation as was seen on preceding CTA. Note is again made of additional branch of the GDA at its mid aspect however this vessel does not contribute to the ectatic portion of the more distal aspect of the GDA. Following selective arteriogram and percutaneous coil embolization of the gastroepiploic, the anterior and posterior divisions of the pancreaticoduodenal artery and the distal aspect of the GDA, there is complete occlusion of the ectatic segment of the GDA without persistent flow. The more proximal branch of the GDA, not contributing to the distal ectatic segment, remains patent. IMPRESSION: Technically successful percutaneous coil embolization of ectatic segment of the distal GDA at the level of its trifurcation. PLAN: - Continued conservative management with daily CBCs as per the primary team. - Repeat endoscopy may be performed at the discretion GI service as clinically indicated. Electronically Signed   By: Sandi Mariscal M.D.   On: 10/30/2018 11:54   Ct Angio Abd/pel W/ And/or W/o  Addendum Date: 10/29/2018   ADDENDUM REPORT: 10/29/2018 09:42 ADDENDUM: There are several small nodular densities in the right lower lobe. Largest nodular area measures 7 x 4 mm on sequence 14, image 14. Many of these nodular areas are irregular. Findings are suggestive for an infectious or inflammatory process but nonspecific. These nodular areas are new from a chest CT on 06/03/2004. Non-contrast chest CT at 3-6 months is recommended. If the nodules are stable at time of repeat CT, then future CT at 18-24 months (from today's scan) is considered optional for low-risk patients, but is recommended for high-risk  patients. This recommendation follows the consensus statement: Guidelines for Management of Incidental Pulmonary Nodules Detected on CT Images: From the Fleischner Society 2017; Radiology 2017; 284:228-243. These results will be called to the ordering clinician or representative by the Radiologist Assistant, and communication documented in the PACS or zVision Dashboard. Electronically Signed   By: Markus Daft M.D.   On: 10/29/2018 09:42   Result Date: 10/29/2018 CLINICAL DATA:  73 year old with GI bleeding. Recent upper endoscopy demonstrates a 7 mm polypoid lesion with bleeding at the duodenal bulb. EXAM: CT ANGIOGRAPHY ABDOMEN AND PELVIS WITH CONTRAST AND WITHOUT CONTRAST TECHNIQUE: Multidetector CT imaging of the abdomen and pelvis was performed using the standard protocol during bolus administration of intravenous contrast. Multiplanar reconstructed images and MIPs were obtained and reviewed to evaluate the vascular anatomy. CONTRAST:  17m OMNIPAQUE IOHEXOL 350 MG/ML SOLN COMPARISON:  None. FINDINGS: VASCULAR Aorta: Calcified atherosclerotic disease in the abdominal aorta without aneurysm or dissection. Celiac: Celiac trunk is widely patent. Main branch vessels of the celiac trunk are the left gastric artery, splenic artery and common hepatic artery. Focal calcifications in the distal splenic artery may represent small aneurysms, largest measuring 0.6 cm. There is focal ectasia of the distal gastroduodenal artery on sequence 5, image 71. This area of ectasia measures 1.1 cm in length and the diameter of the vessel measures up to 0.6 cm. The GDA vessel measures roughly 0.4 cm. This area of ectasia is at a branch site. There is no active contrast extravasation around this area of vessel enlargement. Findings could represent a small pseudoaneurysm. There is no evidence for contrast extravasation within duodenum. This focal ectasia and possible pseudoaneurysm involving the distal GDA is along the posterior caudal  aspect of the duodenal bulb region. No evidence for an enhancing intraluminal polypoid lesion. SMA: Evidence for noncalcified plaque at the origin of SMA causing at least mild stenosis  at the origin. Main branch vessels of the SMA are patent. Renals: Both renal arteries are patent without evidence of aneurysm, dissection, vasculitis, fibromuscular dysplasia or significant stenosis. IMA: IMA is patent. Inflow: Atherosclerotic plaque at the origin of the right common iliac artery without significant stenosis. Right iliac arteries are patent. Atherosclerotic disease in the right internal iliac artery. Mild plaque in left iliac arteries without significant stenosis. Proximal Outflow: Proximal femoral arteries are patent bilaterally without significant stenosis. Veins: Portal venous system is patent. IVC and renal veins are patent. No gross abnormality to the proximal femoral or iliac veins. Review of the MIP images confirms the above findings. NON-VASCULAR Lower chest: Chest and septal thickening in the lower lungs with small bilateral pleural effusions. Hepatobiliary: Normal appearance of the liver and gallbladder. No biliary dilatation. Pancreas: Unremarkable. No pancreatic ductal dilatation or surrounding inflammatory changes. Spleen: Normal in size without focal abnormality. Adrenals/Urinary Tract: Normal appearance of the adrenal glands. Small hypodensity in the right kidney lower pole is too small to definitively characterize. No hydronephrosis. No suspicious renal lesions. Urinary bladder is unremarkable. Stomach/Bowel: Prominent diverticulosis in the sigmoid colon without acute colonic inflammation. Evidence for normal appendix. Normal appearance of the stomach. Normal appearance of the duodenum. No extrinsic compression or lesion in the region of the duodenal bulb or descending duodenum. Minimal fat stranding between the pancreatic head and duodenum is nonspecific. Again, no active contrast extravasation in the  duodenal bulb region. Negative for small bowel dilatation. Lymphatic: No abdominopelvic lymphadenopathy. Reproductive: Uterus and bilateral adnexa are unremarkable. Incidentally, there are calcifications associated with the right ovary or right adnexa. Other: Negative free air.  Negative for free fluid. Musculoskeletal: Mild dextroscoliosis in the lumbar spine. Multilevel degenerative disc space narrowing with vacuum disc phenomena in the lumbar spine. Vertebral body heights are maintained. IMPRESSION: VASCULAR 1. Focal ectasia and irregularity of the distal gastroduodenal artery at a branch site. Findings could represent a small pseudoaneurysm. This area of vessel enlargement is along the posterior and caudal aspect of the duodenal bulb. There is no active contrast extravasation associated with this small pseudoaneurysm or ectasia and no evidence for contrast within the duodenum. 2.  Aortic Atherosclerosis (ICD10-I70.0). 3. 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 4. Two small calcified areas involving distal splenic artery which could represent small aneurysms, largest measuring 0.6 cm. NON-VASCULAR 1. No acute abnormality in the abdomen or pelvis. 2. Small bilateral pleural effusions. Electronically Signed: By: Markus Daft M.D. On: 10/29/2018 09:19    EKG: SR PAC no acute changes    ASSESSMENT AND PLAN:   Dyspnea/Syncope:  Impressively she has limited recollection of events. Doubt cardiac etiology with no cardiac symptoms of chest pain or preceding palpitations and no cardiac history ECG ok Echo 10/31/18 Normal EF 60-65% With elevated D dimer will order CTA chest r/o PE which is high on differential No further cardiac evaluation seems warranted With lack of chest pain and normal EF don't think stress testing needed   Signed: Jenkins Rouge 11/06/2018, 3:56 PM

## 2018-11-06 NOTE — Progress Notes (Signed)
Pt is alert and oriented X2 only to herself and date, unable to tell where she is and what made her come to the hospital. She  stated several times earlier that she wants to go home. Her daughter and  MD on call are aware, order has been obtained and administered earlier with some relief. Will continue monitoring the patient.

## 2018-11-06 NOTE — Progress Notes (Signed)
Patient is getting confused as evening sets(sundowning), says she wants to go upstairs to her room, she want her daughter [name] to come and pick her up, pacing down the hall. 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 RN asked daughter what does patient takes at home for the above she said xanax but that does not even work most of the time. Family med MD page with updates. Awaiting orders or called back Patient is now in bed with alarms on. Will continue to monitor.

## 2018-11-06 NOTE — Progress Notes (Signed)
HS troponin is 40. On call provider notified, awaiting call back.

## 2018-11-06 NOTE — ED Notes (Signed)
Pt removed from NRB, placed on 4L [place].

## 2018-11-06 NOTE — ED Notes (Signed)
ED TO INPATIENT HANDOFF REPORT  ED Nurse Name and Phone #: Kathie Rhodes RN  S Name/Age/Gender Hannah [name] 73 y.o. female Room/Bed: TRABC/TRABC  Code Status   Code Status: Prior  Home/SNF/Other Home Patient oriented to: self, place, time and situation Is this baseline? Yes   Triage Complete: Triage complete  Chief Complaint SOB   Triage Note Pt BIB [facility], c/o sudden onset shortness of breath that started approx. 73mins PTA. Pt diaphoretic, EMS report no room air SpO2, initially 84% on NRB, eventually improving to 99%. Hx dementia, pt at baseline. Recent admission for anemia. Given 0.3mg  IM epi by EMS.    Allergies No Known Allergies  Level of Care/Admitting Diagnosis ED Disposition    ED Disposition Condition Comment   Admit  Hospital Area: [place] [[id number]]  Level of Care: Med-Surg [16]  Covid Evaluation: Confirmed COVID Negative  Diagnosis: CHF (congestive heart failure) [facility]) [[id number]]  Admitting Physician: Cephus Slater  Attending Physician: Erin Hearing, MARSHALL L [[id number]]  Estimated length of stay: past midnight tomorrow  Certification:: I certify this patient will need inpatient services for at least 2 midnights  PT Class (Do Not Modify): Inpatient [101]  PT Acc Code (Do Not Modify): Private [1]       B Medical/Surgery History Past Medical History:  Diagnosis Date  . Anemia   . Anxiety   . Arthritis    "hands" (05/09/2015)  . GERD (gastroesophageal reflux disease)   . High cholesterol   . History of blood transfusion 1960s; 05/08/2015   "when I had my kids; low HgB"  . Hypertension   . Osteoarthritis   . Pneumonia 05/2004   Past Surgical History:  Procedure Laterality Date  . BIOPSY  10/28/2018   Procedure: BIOPSY;  Surgeon: Juanita Craver, MD;  Location: [facility] ENDOSCOPY;  Service: Endoscopy;;  . CARDIAC CATHETERIZATION  05/2004  . COLONOSCOPY  07/2002   Archie Endo 09/02/2010  . COLONOSCOPY WITH PROPOFOL N/A 10/28/2018   Procedure: COLONOSCOPY WITH PROPOFOL;  Surgeon: Juanita Craver, MD;  Location: [facility] ENDOSCOPY;  Service: Endoscopy;  Laterality: N/A;  . ESOPHAGOGASTRODUODENOSCOPY  07/2002   w/biopsies/notes 09/02/2010  . ESOPHAGOGASTRODUODENOSCOPY (EGD) WITH PROPOFOL N/A 10/28/2018   Procedure: ESOPHAGOGASTRODUODENOSCOPY (EGD) WITH PROPOFOL;  Surgeon: Juanita Craver, MD;  Location: [facility] ENDOSCOPY;  Service: Endoscopy;  Laterality: N/A;  . HOT HEMOSTASIS N/A 10/28/2018   Procedure: HOT HEMOSTASIS (ARGON PLASMA COAGULATION/BICAP);  Surgeon: Juanita Craver, MD;  Location: [facility] ENDOSCOPY;  Service: Endoscopy;  Laterality: N/A;  . IR ANGIOGRAM SELECTIVE EACH ADDITIONAL VESSEL  10/30/2018  . IR ANGIOGRAM SELECTIVE EACH ADDITIONAL VESSEL  10/30/2018  . IR ANGIOGRAM SELECTIVE EACH ADDITIONAL VESSEL  10/30/2018  . IR ANGIOGRAM SELECTIVE EACH ADDITIONAL VESSEL  10/30/2018  . IR ANGIOGRAM VISCERAL SELECTIVE  10/30/2018  . IR EMBO ART  VEN HEMORR LYMPH EXTRAV  INC GUIDE ROADMAPPING  10/30/2018  . IR US GUIDANCE  10/30/2018  . POLYPECTOMY  10/28/2018   Procedure: POLYPECTOMY;  Surgeon: Juanita Craver, MD;  Location: [facility] ENDOSCOPY;  Service: Endoscopy;;     A IV Location/Drains/Wounds Patient Lines/Drains/Airways Status   Active Line/Drains/Airways    Name:   Placement date:   Placement time:   Site:   Days:   Peripheral IV 11/06/18 Right Antecubital   11/06/18    0149    Antecubital   less than 1          Intake/Output Last 24 hours No intake or output data in the 24 hours ending 11/06/18 0411  Labs/Imaging Results for orders placed or performed during the hospital encounter of 11/06/18 (from the past 48 hour(s))  CBG monitoring, ED     Status: Abnormal   Collection Time: 11/06/18  1:50 AM  Result Value Ref Range   Glucose-Capillary 246 (H) 70 - 99 mg/dL  Basic metabolic panel     Status: Abnormal   Collection Time: 11/06/18  1:57 AM  Result Value Ref Range   Sodium 137 135 - 145 mmol/L   Potassium 4.8 3.5 - 5.1 mmol/L    Chloride 103 98 - 111 mmol/L   CO2 19 (L) 22 - 32 mmol/L   Glucose, Bld 257 (H) 70 - 99 mg/dL   BUN 28 (H) 8 - 23 mg/dL   Creatinine, Ser 1.50 (H) 0.44 - 1.00 mg/dL   Calcium 8.3 (L) 8.9 - 10.3 mg/dL   GFR calc non Af Amer 34 (L) >60 mL/min   GFR calc Af Amer 39 (L) >60 mL/min   Anion gap 15 5 - 15    Comment: Performed at [facility], [address] [place], [place] [address]  CBC with Differential/Platelet     Status: Abnormal   Collection Time: 11/06/18  1:57 AM  Result Value Ref Range   WBC 13.0 (H) 4.0 - 10.5 K/uL   RBC 3.72 (L) 3.87 - 5.11 MIL/uL   Hemoglobin 9.9 (L) 12.0 - 15.0 g/dL   HCT 34.7 (L) 36.0 - 46.0 %   MCV 93.3 80.0 - 100.0 fL   MCH 26.6 26.0 - 34.0 pg   MCHC 28.5 (L) 30.0 - 36.0 g/dL   RDW 15.8 (H) 11.5 - 15.5 %   Platelets 449 (H) 150 - 400 K/uL   nRBC 0.0 0.0 - 0.2 %   Neutrophils Relative % 61 %   Neutro Abs 8.0 (H) 1.7 - 7.7 K/uL   Lymphocytes Relative 31 %   Lymphs Abs 4.0 0.7 - 4.0 K/uL   Monocytes Relative 6 %   Monocytes Absolute 0.8 0.1 - 1.0 K/uL   Eosinophils Relative 0 %   Eosinophils Absolute 0.0 0.0 - 0.5 K/uL   Basophils Relative 1 %   Basophils Absolute 0.1 0.0 - 0.1 K/uL   Immature Granulocytes 1 %   Abs Immature Granulocytes 0.06 0.00 - 0.07 K/uL    Comment: Performed at [facility], [place] [address] [place], [place] [address]  Brain natriuretic peptide     Status: Abnormal   Collection Time: 11/06/18  1:57 AM  Result Value Ref Range   B Natriuretic Peptide 358.1 (H) 0.0 - 100.0 pg/mL    Comment: Performed at [place] [address], [place], [place] [address]  Troponin I (High Sensitivity)     Status: None   Collection Time: 11/06/18  1:57 AM  Result Value Ref Range   Troponin I (High Sensitivity) 17 <18 ng/L    Comment: (NOTE) Elevated high sensitivity troponin I (hsTnI) values and significant  changes across serial measurements may suggest ACS but many other  chronic and acute conditions are known to  elevate hsTnI results.  Refer to the "Links" section for chest pain algorithms and additional  guidance. Performed at [facility], [place] [address] [place], [place] [address]   SARS Coronavirus 2 (CEPHEID - Performed in [facility] hospital lab), Hosp Order     Status: None   Collection Time: 11/06/18  2:00 AM   Specimen: Nasopharyngeal Swab  Result Value Ref Range  SARS Coronavirus 2 NEGATIVE NEGATIVE    Comment: (NOTE) If result is NEGATIVE SARS-CoV-2 target nucleic acids are NOT DETECTED. The SARS-CoV-2 RNA is generally detectable in upper and lower  respiratory specimens during the acute phase of infection. The lowest  concentration of SARS-CoV-2 viral copies this assay can detect is 250  copies / mL. A negative result does not preclude SARS-CoV-2 infection  and should not be used as the sole basis for treatment or other  patient management decisions.  A negative result may occur with  improper specimen collection / handling, submission of specimen other  than nasopharyngeal swab, presence of viral mutation(s) within the  areas targeted by this assay, and inadequate number of viral copies  (<250 copies / mL). A negative result must be combined with clinical  observations, patient history, and epidemiological information. If result is POSITIVE SARS-CoV-2 target nucleic acids are DETECTED. The SARS-CoV-2 RNA is generally detectable in upper and lower  respiratory specimens dur ing the acute phase of infection.  Positive  results are indicative of active infection with SARS-CoV-2.  Clinical  correlation with patient history and other diagnostic information is  necessary to determine patient infection status.  Positive results do  not rule out bacterial infection or co-infection with other viruses. If result is PRESUMPTIVE POSTIVE SARS-CoV-2 nucleic acids MAY BE PRESENT.   A presumptive positive result was obtained on the submitted specimen  and confirmed on repeat  testing.  While 2019 novel coronavirus  (SARS-CoV-2) nucleic acids may be present in the submitted sample  additional confirmatory testing may be necessary for epidemiological  and / or clinical management purposes  to differentiate between  SARS-CoV-2 and other Sarbecovirus currently known to infect humans.  If clinically indicated additional testing with an alternate test  methodology [id number]) is advised. The SARS-CoV-2 RNA is generally  detectable in upper and lower respiratory sp ecimens during the acute  phase of infection. The expected result is Negative. Fact Sheet for Patients:  StrictlyIdeas.no Fact Sheet for Healthcare Providers: BankingDealers.co.za This test is not yet approved or cleared by the [place] FDA and has been authorized for detection and/or diagnosis of SARS-CoV-2 by FDA under an Emergency Use Authorization (EUA).  This EUA will remain in effect (meaning this test can be used) for the duration of the COVID-19 declaration under Section 564(b)(1) of the Act, 21 U.S.C. section 360bbb-3(b)(1), unless the authorization is terminated or revoked sooner. Performed at [facility], [place] [address] [place], [place] [address]    Dg Chest Port 1 View  Result Date: 11/06/2018 CLINICAL DATA:  Shortness of breath. EXAM: PORTABLE CHEST 1 VIEW COMPARISON:  Radiographs 10/30/2018 FINDINGS: The heart is upper normal in size. Unchanged mediastinal contours with aortic atherosclerosis. Slight increase in pulmonary edema from prior exam with Ker[place] B-lines. Small pleural effusions. No pneumothorax or confluent airspace disease. IMPRESSION: Slight increase in pulmonary edema from prior exam. Small pleural effusions. Aortic Atherosclerosis (ICD10-I70.0). Electronically Signed   By: Keith Rake M.D.   On: 11/06/2018 02:04    Pending Labs Unresulted Labs (From admission, onward)    Start     Ordered   11/06/18 0334   Hemoglobin A1c  Once,   STAT    Comments: To assess prior glycemic control    11/06/18 0333   Signed and Held  Comprehensive metabolic panel  Tomorrow morning,   R     Signed and Held   Signed and Held  CBC WITH DIFFERENTIAL  Once,  R     Signed and Held          Vitals/Pain Today's Vitals   11/06/18 0245 11/06/18 0300 11/06/18 0315 11/06/18 0345  BP: (!) 102/50 (!) 99/51 (!) 106/47 133/74  Pulse: 91 85 80 90  Resp: (!) 24 (!) 25 (!) 23 (!) 21  Temp:      TempSrc:      SpO2: 94% 96% 97% 94%  PainSc:        Isolation Precautions No active isolations  Medications Medications  insulin aspart (novoLOG) injection 0-9 Units (has no administration in time range)  furosemide (LASIX) injection 40 mg (has no administration in time range)  furosemide (LASIX) injection 40 mg (40 mg Intravenous Given 11/06/18 0227)    Mobility Unknown  Low fall risk   Focused Assessments Pulmonary Assessment Handoff:  Lung sounds: Bilateral Breath Sounds: Coarse crackles O2 Device: Nasal Cannula O2 Flow Rate (L/min): 4 L/min      R Recommendations: See Admitting Provider Note  Report given to:   Additional Notes: Pt resting comfortably

## 2018-11-06 NOTE — Progress Notes (Signed)
Daughter [name] called and updated on patient's status.

## 2018-11-06 NOTE — ED Notes (Signed)
Daughter, [name] Baker(POA) [phone].

## 2018-11-06 NOTE — Progress Notes (Signed)
   11/06/18 0529  Vitals  Temp 98.5 F (36.9 C)  Temp Source Oral  BP (!) 125/51  MAP (mmHg) 73  BP Location Left Arm  BP Method Automatic  Patient Position (if appropriate) Lying  Pulse Rate 85  Pulse Rate Source Monitor  Resp (!) 24  Oxygen Therapy  SpO2 100 %  O2 Device Nasal Cannula  O2 Flow Rate (L/min) 2 L/min  Pain Assessment  Pain Scale 0-10  Pain Score 0  Height and Weight  Height 5\' 2"  (1.575 m)  Weight 76.3 kg  Type of Scale Used Standing (scale A)  Type of Weight Actual  BSA (Calculated - sq m) 1.83 sq meters  BMI (Calculated) 30.76  Weight in (lb) to have BMI = 25 136.4  MEWS Score  MEWS RR 1  MEWS Pulse 0  MEWS Systolic 0  MEWS LOC 0  MEWS Temp 0  MEWS Score 1  MEWS Score Color Green  Admitted pt to rm 3E07 from ED, pt alert and oriented, denies pain at this time, oriented to room, call bell placed within reach. Bed alarm activated.

## 2018-11-06 NOTE — H&P (Signed)
[facility] Admission History and Physical Service Pager: [phone]  [name] Medical record number: [id number] Date of birth: 10/30/1945 Age: 73 y.o. Gender: female  Primary Care Provider: [name], MD Consultants: none Code Status: full Preferred Emergency Contact: [place], daughter, [phone] (healthcare power of attorney)  Chief Complaint: shortness of breath  Assessment and Plan: Hannah [name] is a 73 y.o. female presenting with sudden onset shortness of breath.  Initially on nonrebreather, able to wean down to 4 L after receiving 1 dose of Lasix.   Shortness of breath By reports both from EMS and patient she had sudden onset of shortness of breath.  The patient history is somewhat limited given that she has known dementia.  She actually describes getting progressively short of breath over the afternoon and evening of 11/05/2018.  She blames on having "too many guests at the house".  Patient received 1 dose of IV Lasix and did show notable improvement.  Patient recently admitted in mid July for GI bleed, but did develop shortness of breath consistent with volume overload and did require 2 doses of Lasix.  Her BNP is elevated up to 358, which is significantly higher than even during that admission.  Other items on differential include COPD/asthma, PE, ACS, anemia and anxiety.  COPD/asthma ruled out based on exam and history.  PE very unlikely given the patient's well score is either 0 or 1.5 based on tachycardia.  ACS effectively ruled out with normal at hstroponin x1 and essentially normal EKG.  Still 1 high-sensitivity troponin pending.  Hemoglobin around 10 which is greatly improved from previous admission.  Patient does have known anxiety for which she takes as needed Xanax and Zoloft.  Patient feels that her anxiety significantly contributed to her presentation, and likely did play some role but is very unlikely  to be the sole reason for her dyspnea.  Of note patient had echocardiogram last admission which showed normal EF, and no significant diastolic dysfunction.  Upon review patient's albumin was 2.7 back in last admission.  Her fluid overload likely is secondary to fluid leak secondary to protein malnutrition -Admit to inpatient family medicine, Dr. Erin Hearing, inpatient -Vital signs per floor routine -Wean O2 as able, currently on 4 L -Likely give additional dose of IV Lasix 40 -Strict I's and O's -Daily weights -Continuous pulse ox -Hold Xanax during admission -Would consider increasing Zoloft to 50 mg daily for anxiety -Monitor serum creatinine -SCDs for DVT prophylaxis in setting of recent admission for GI bleed  Recent admission for GI bleed Had coil embolization for ectasia of trifurcation of GDA.  Doing well from this perspective.  Hemoglobin around 10, greatly improved from admission.  Will hold off on anticoagulant for DVT prophylaxis at this time.  We will do SCDs.  Could potentially restart during admission and monitor hemoglobin.  Takes Prilosec 20 mg daily as needed.  We will switch to pantoprazole 40 mg daily per formulary -Daily CBC -Could consider starting heparin for DVT prophylaxis per admission -Pantoprazole 40 mg daily  Anemia Hemoglobin 9.9 this admission.  Greatly improved from previous admission.  Normal MCV.  Some of this likely dilutional given that her white blood cell count and platelets are also elevated compared to prior study.  We will continue with daily CBCs, true anemia likely due to blood loss secondary to GI bleed last admission. -Daily CBC -SCDs for DVT prophylaxis  CKD stage IIIb Creatinine 1.50, GFR 34.  This has  worsened slightly since discharge last admission.  Patient's creatinine was 1.25, with GFR 42 at that time.  This is significantly worsened since admission for last admission which her creatinine was around 1, with GFR in the 40s.  Patient likely  with some aspect of acute kidney injury given the contrast loads that she received during last admission, and possible dehydration given the hemoconcentration seen on her labs.  This is also been taking her lisinopril as prescribed during this time.  Last albumin 2.9 on 7/12.  Patient is likely intravascularly dehydrated, but does have at some aspect of pulmonary edema.  Will need to monitor very closely given her diuresis. -Daily BMP -Avoid nephrotoxic medications -Gentle diuresis -Add on Ensure shakes twice daily for low albumin  Anxiety Takes Xanax 0.25 mg at bedtime as needed.  PCP has been slowly weaning this medication for course of years.  Reviewed PMP aware, last filled on 11/02/2018. Takes Zoloft 25 mg daily.  Patient feels her anxiety did contribute somewhat to her breathing issues.  Wonder she would be a good candidate to go to 50 mg daily of Zoloft.  Will discuss with PCP and make changes as appropriate. -Holding Xanax while admitted for shortness of breath -We will consider going to Zoloft 50 mg daily  Hyperlipidemia Last cholesterol panel performed 2018.  Cholesterol greater than 230, HDL 52.  Takes atorvastatin 40 mg.  Will defer lipid panel at this time.  Has known microvascular chronic changes so this is likely for secondary prevention so we will continue as indicated. - Continue atorvastatin 40 mg  Hypertension Blood pressures have been somewhat erratic since admission.  Initially BP was 152/79.  Had a dip to 99/51.  Patient's blood pressure was 133/74 while this provider was in the room examining patient.  Takes lisinopril 2.5 mg, and carvedilol 12.5 mg twice daily.  Given the patient will be undergoing diuresis will hold lisinopril in hopes of preventing AKI.  Continue Coreg. Expect hypertension to improve once volume status normalizes. -Continue Coreg 12.5 mg twice daily -Hold lisinopril -Expect to improve once volume status normalizes secondary to diuresis  Dementia Per PCP  notes patient has had progressive intermittent dementia.  CT head back in February 2020 did show mild microvascular ischemic disease without any acute process.  Patient's daughter is healthcare power of attorney.  Patient alert and oriented x4 from this provider but this appears to wax and wane based on past notes.  Question of speech eval for possible [place] would be helpful.  History of elevated TSH Last TSH 4.9 back in January 2020.  Patient asymptomatic.  Per PCP last note, check in 4 months.  We will recheck during hospitalization.  Could potentially be a cause of symptoms if significantly elevated. -Check TSH  Hyperglycemia Patient with CBG 257 on admission.  Review of chart reveals no recent A1c.  Sensitive sliding scale insulin, check A1c  Hypomagnesemia Magnesium 1.1 on day of discharge last admission.  Not rechecked this admission.  Will add on, replete as necessary.  Severe protein calorie malnutrition Albumin 2.7 back on 7/12.  Will add on Ensure shakes to supplement nutrition.  PMH is significant for dementia, hypertension, hld, bleeding gastric polyp, GDA ectasia, ckd stage IIIb, anxiety   FEN/GI: Heart healthy diet Prophylaxis: SCDs  Disposition: Pending clinical course  History of Present Illness:  Hannah [name] is a 73 y.o. female presenting with 1 day history of shortness of breath.  Patient has known dementia and is a poor historian.  This history is gleaned from EMS notes and discussion with ED provider.  Patient symptoms started when she was at home in the evening of 7/18 when she developed acute shortness of breath.  Per patient this is been building for some time eventually got to where she could not breathe.  She felt had a lot to do with having "too many people at her house".  She feels that her known anxiety played a large role.  When she was unable to catch her breath she "spiraled out of control".  At that point EMS was called who placed patient on nonrebreather  and brought her to [facility] emergency department for further work-up.  Of note patient was recently admitted in mid July for a GI bleed.  She did develop acute shortness of breath and was treated for fluid overload with 2 days of Lasix.  Patient does not endorse any other symptoms.  Work-up in the ED consisted of BMP, BNP, high-sensitivity troponin x1, CBC, COVID-19 PCR, chest x-ray, EKG twelve-lead.  BMP significant for creatinine 1.5, BUN 28, CO2 19.  BNP elevated at 358, CBC significant for WBC 13, hemoglobin 9.9, platelet 450.  COVID-19 PCR negative.  EKG showed normal sinus rhythm, PVCs were noted though.  Chest x-ray significant for an increase in pulmonary edema from prior exam, kerly B-lines were noted.  Review Of Systems: Per HPI with the following additions:   ROS Pan-negative for all symptoms.  Patient felt to be somewhat unreliable given her history of dementia  Patient Active Problem List   Diagnosis Date Noted  . CHF (congestive heart failure) ([place]) 11/06/2018  . Gastrointestinal hemorrhage, unspecified   . Gastric bleed   . Symptomatic anemia 10/27/2018  . Right foot pain 06/22/2018  . Inability to bear weight 06/22/2018  . Left foot pain 05/31/2018  . Elevated TSH 02/03/2018  . Cerebral microvascular disease 02/02/2018  . Renal insufficiency 11/09/2017  . Gout 09/11/2015  . OBESITY 03/26/2010  . Anxiety state 08/09/2008  . Essential hypertension 08/09/2008  . GERD 08/09/2008    Past Medical History: Past Medical History:  Diagnosis Date  . Anemia   . Anxiety   . Arthritis    "hands" (05/09/2015)  . GERD (gastroesophageal reflux disease)   . High cholesterol   . History of blood transfusion 1960s; 05/08/2015   "when I had my kids; low HgB"  . Hypertension   . Osteoarthritis   . Pneumonia 05/2004    Past Surgical History: Past Surgical History:  Procedure Laterality Date  . BIOPSY  10/28/2018   Procedure: BIOPSY;  Surgeon: Juanita Craver, MD;  Location: [facility]  ENDOSCOPY;  Service: Endoscopy;;  . CARDIAC CATHETERIZATION  05/2004  . COLONOSCOPY  07/2002   Archie Endo 09/02/2010  . COLONOSCOPY WITH PROPOFOL N/A 10/28/2018   Procedure: COLONOSCOPY WITH PROPOFOL;  Surgeon: Juanita Craver, MD;  Location: [facility] ENDOSCOPY;  Service: Endoscopy;  Laterality: N/A;  . ESOPHAGOGASTRODUODENOSCOPY  07/2002   w/biopsies/notes 09/02/2010  . ESOPHAGOGASTRODUODENOSCOPY (EGD) WITH PROPOFOL N/A 10/28/2018   Procedure: ESOPHAGOGASTRODUODENOSCOPY (EGD) WITH PROPOFOL;  Surgeon: Juanita Craver, MD;  Location: [facility] ENDOSCOPY;  Service: Endoscopy;  Laterality: N/A;  . HOT HEMOSTASIS N/A 10/28/2018   Procedure: HOT HEMOSTASIS (ARGON PLASMA COAGULATION/BICAP);  Surgeon: Juanita Craver, MD;  Location: [facility] ENDOSCOPY;  Service: Endoscopy;  Laterality: N/A;  . IR ANGIOGRAM SELECTIVE EACH ADDITIONAL VESSEL  10/30/2018  . IR ANGIOGRAM SELECTIVE EACH ADDITIONAL VESSEL  10/30/2018  . IR ANGIOGRAM SELECTIVE EACH ADDITIONAL VESSEL  10/30/2018  . IR ANGIOGRAM  SELECTIVE EACH ADDITIONAL VESSEL  10/30/2018  . IR ANGIOGRAM VISCERAL SELECTIVE  10/30/2018  . IR EMBO ART  VEN HEMORR LYMPH EXTRAV  INC GUIDE ROADMAPPING  10/30/2018  . IR US GUIDANCE  10/30/2018  . POLYPECTOMY  10/28/2018   Procedure: POLYPECTOMY;  Surgeon: Juanita Craver, MD;  Location: [facility] ENDOSCOPY;  Service: Endoscopy;;    Social History: Social History   Tobacco Use  . Smoking status: Former Smoker    Packs/day: 0.30    Years: 8.00    Pack years: 2.40    Types: Cigarettes    Quit date: 05/30/2004    Years since quitting: 14.4  . Smokeless tobacco: Never Used  Substance Use Topics  . Alcohol use: No    Alcohol/week: 0.0 standard drinks  . Drug use: Never   Additional social history:  Please also refer to relevant sections of EMR.  Family History: Family History  Problem Relation Age of Onset  . Heart disease Mother        died at age 6  . Breast cancer Sister   . Cancer Sister        Breast  . Hypertension Father   . Hypertension  Sister     Allergies and Medications: No Known Allergies No current facility-administered medications on file prior to encounter.    Current Outpatient Medications on File Prior to Encounter  Medication Sig Dispense Refill  . acetaminophen (TYLENOL) 325 MG tablet Take 650 mg by mouth every 6 (six) hours as needed for mild pain or headache.    . ALPRAZolam (XANAX) 0.25 MG tablet TAKE 1 TABLET (0.25 MG TOTAL) BY MOUTH AT BEDTIME AS NEEDED. 30 tablet 0  . atorvastatin (LIPITOR) 40 MG tablet Take 1 tablet (40 mg total) by mouth daily. 90 tablet 3  . carvedilol (COREG) 12.5 MG tablet Take 1 tablet (12.5 mg total) by mouth 2 (two) times daily with a meal. 180 tablet 3  . lisinopril (ZESTRIL) 2.5 MG tablet Take 1 tablet (2.5 mg total) by mouth daily. 30 tablet 0  . omeprazole (PRILOSEC) 20 MG capsule TAKE 1 CAPSULE BY MOUTH DAILY AS NEEDED (Patient taking differently: Take 20 mg by mouth as needed. ) 180 capsule 0  . sertraline (ZOLOFT) 50 MG tablet TAKE 1/2 TABLET BY MOUTH DAILY 45 tablet 0    Objective: BP 133/74   Pulse 90   Temp (!) 97 F (36.1 C) (Temporal)   Resp (!) 21   SpO2 94%  Exam: General: Very pleasant 73 year old Caucasian female, no acute distress, resting comfortably on 4 L nasal cannula. Eyes: EOMI, PERRLA Cardiovascular: Regular rate and rhythm, no M/R/G appreciated.  Palpable radial pulse bilaterally.  Skin warm and dry.  1+ pitting edema noted bilateral lower extremity Respiratory: Crackles noted especially in upper lobes.  Likely secondary to patient position.  Very comfortable on 4 L, no accessory muscle use, able to converse in full sentences Gastrointestinal: Soft, nontender, nondistended MSK: 5 out of 5 strength in all tested muscle groups bilateral upper extremity, bilateral lower extremity Derm: Seborrheic keratoses noted on torso abdomen.  Warm and dry Neuro: CN II through XII intact, alert and oriented x4, no focal neurologic deficit appreciated Psych: Very  pleasant  Labs and Imaging: CBC BMET  Recent Labs  Lab 11/06/18 0157  WBC 13.0*  HGB 9.9*  HCT 34.7*  PLT 449*   Recent Labs  Lab 11/06/18 0157  NA 137  K 4.8  CL 103  CO2 19*  BUN 28*  CREATININE  1.50*  GLUCOSE 257*  CALCIUM 8.3*     EKG: Sinus rhythm, PVCs noted  Guadalupe Dawn, MD 11/06/2018, 4:58 AM PGY-3, [facility] pager: [phone], text pages welcome

## 2018-11-06 NOTE — ED Triage Notes (Signed)
Pt BIB [facility], c/o sudden onset shortness of breath that started approx. 60mins PTA. Pt diaphoretic, EMS report no room air SpO2, initially 84% on NRB, eventually improving to 99%. Hx dementia, pt at baseline. Recent admission for anemia. Given 0.3mg  IM epi by EMS.

## 2018-11-06 NOTE — ED Provider Notes (Signed)
[facility] Provider Note   CSN: [id number] Arrival date & time: 11/06/18  0140     History   Chief Complaint Chief Complaint  Patient presents with  . Shortness of Breath   Level 5 caveat due to dementia HPI Hannah [name] is a 73 y.o. female.     The history is provided by the patient, the EMS personnel and a relative. The history is limited by the condition of the patient.  Shortness of Breath Severity:  Severe Onset quality:  Sudden Timing:  Constant Progression:  Improving Chronicity:  New Relieved by:  Oxygen Worsened by:  Nothing Patient presents from home with abrupt onset of shortness of breath.  EMS reports patient was minimally responsive on arrival, she responded to oxygen.  Patient has a history of dementia which limits history.  No other details known on arrival  Past Medical History:  Diagnosis Date  . Anemia   . Anxiety   . Arthritis    "hands" (05/09/2015)  . GERD (gastroesophageal reflux disease)   . High cholesterol   . History of blood transfusion 1960s; 05/08/2015   "when I had my kids; low HgB"  . Hypertension   . Osteoarthritis   . Pneumonia 05/2004    Patient Active Problem List   Diagnosis Date Noted  . Gastrointestinal hemorrhage, unspecified   . Gastric bleed   . Symptomatic anemia 10/27/2018  . Right foot pain 06/22/2018  . Inability to bear weight 06/22/2018  . Left foot pain 05/31/2018  . Elevated TSH 02/03/2018  . Cerebral microvascular disease 02/02/2018  . Renal insufficiency 11/09/2017  . Gout 09/11/2015  . OBESITY 03/26/2010  . Anxiety state 08/09/2008  . Essential hypertension 08/09/2008  . GERD 08/09/2008    Past Surgical History:  Procedure Laterality Date  . BIOPSY  10/28/2018   Procedure: BIOPSY;  Surgeon: Juanita Craver, MD;  Location: [facility] ENDOSCOPY;  Service: Endoscopy;;  . CARDIAC CATHETERIZATION  05/2004  . COLONOSCOPY  07/2002   Archie Endo 09/02/2010  . COLONOSCOPY WITH  PROPOFOL N/A 10/28/2018   Procedure: COLONOSCOPY WITH PROPOFOL;  Surgeon: Juanita Craver, MD;  Location: [facility] ENDOSCOPY;  Service: Endoscopy;  Laterality: N/A;  . ESOPHAGOGASTRODUODENOSCOPY  07/2002   w/biopsies/notes 09/02/2010  . ESOPHAGOGASTRODUODENOSCOPY (EGD) WITH PROPOFOL N/A 10/28/2018   Procedure: ESOPHAGOGASTRODUODENOSCOPY (EGD) WITH PROPOFOL;  Surgeon: Juanita Craver, MD;  Location: [facility] ENDOSCOPY;  Service: Endoscopy;  Laterality: N/A;  . HOT HEMOSTASIS N/A 10/28/2018   Procedure: HOT HEMOSTASIS (ARGON PLASMA COAGULATION/BICAP);  Surgeon: Juanita Craver, MD;  Location: [facility] ENDOSCOPY;  Service: Endoscopy;  Laterality: N/A;  . IR ANGIOGRAM SELECTIVE EACH ADDITIONAL VESSEL  10/30/2018  . IR ANGIOGRAM SELECTIVE EACH ADDITIONAL VESSEL  10/30/2018  . IR ANGIOGRAM SELECTIVE EACH ADDITIONAL VESSEL  10/30/2018  . IR ANGIOGRAM SELECTIVE EACH ADDITIONAL VESSEL  10/30/2018  . IR ANGIOGRAM VISCERAL SELECTIVE  10/30/2018  . IR EMBO ART  VEN HEMORR LYMPH EXTRAV  INC GUIDE ROADMAPPING  10/30/2018  . IR US GUIDANCE  10/30/2018  . POLYPECTOMY  10/28/2018   Procedure: POLYPECTOMY;  Surgeon: Juanita Craver, MD;  Location: [facility] ENDOSCOPY;  Service: Endoscopy;;     OB History   No obstetric history on file.      Home Medications    Prior to Admission medications   Medication Sig Start Date End Date Taking? Authorizing Provider  acetaminophen (TYLENOL) 325 MG tablet Take 650 mg by mouth every 6 (six) hours as needed for mild pain or headache.    [provider]  ALPRAZolam (XANAX) 0.25 MG tablet TAKE 1 TABLET (0.25 MG TOTAL) BY MOUTH AT BEDTIME AS NEEDED. 10/14/18   [name], MD  atorvastatin (LIPITOR) 40 MG tablet Take 1 tablet (40 mg total) by mouth daily. 05/31/18   [name], MD  carvedilol (COREG) 12.5 MG tablet Take 1 tablet (12.5 mg total) by mouth 2 (two) times daily with a meal. 11/09/17   Chambliss, Jeb Levering, MD  lisinopril (ZESTRIL) 2.5 MG tablet Take 1 tablet (2.5 mg total) by  mouth daily. 11/02/18   Lattie Haw, MD  omeprazole (PRILOSEC) 20 MG capsule TAKE 1 CAPSULE BY MOUTH DAILY AS NEEDED Patient taking differently: Take 20 mg by mouth as needed.  08/09/18   [name], MD  sertraline (ZOLOFT) 50 MG tablet TAKE 1/2 TABLET BY MOUTH DAILY 09/13/18   [name], MD    Family History Family History  Problem Relation Age of Onset  . Heart disease Mother        died at age 42  . Breast cancer Sister   . Cancer Sister        Breast  . Hypertension Father   . Hypertension Sister     Social History Social History   Tobacco Use  . Smoking status: Former Smoker    Packs/day: 0.30    Years: 8.00    Pack years: 2.40    Types: Cigarettes    Quit date: 05/30/2004    Years since quitting: 14.4  . Smokeless tobacco: Never Used  Substance Use Topics  . Alcohol use: No    Alcohol/week: 0.0 standard drinks  . Drug use: Never     Allergies   Patient has no known allergies.   Review of Systems Review of Systems  Unable to perform ROS: Dementia  Respiratory: Positive for shortness of breath.      Physical Exam Updated Vital Signs BP (!) 152/79 (BP Location: Left Arm)   Pulse (!) 113   Temp (!) 97 F (36.1 C) (Temporal)   Resp 20   SpO2 99%   Physical Exam CONSTITUTIONAL: Elderly, ill-appearing HEAD: Normocephalic/atraumatic EYES: EOMI/PERRL ENMT: Mucous membranes moist, NRB mask in place NECK: supple no meningeal signs SPINE/BACK:entire spine nontender CV: S1/S2 noted, no murmurs/rubs/gallops noted LUNGS: Tachypnea, crackles bilaterally ABDOMEN: soft, nontender, no rebound or guarding, bowel sounds noted throughout abdomen GU:no cva tenderness NEURO: Pt is awake/alert, moves all extremitiesx4.  No facial droop.   EXTREMITIES: pulses normal/equal, full ROM SKIN: Pale PSYCH: Unable to assess  ED Treatments / Results  Labs (all labs ordered are listed, but only abnormal results are displayed) Labs Reviewed  BASIC  METABOLIC PANEL - Abnormal; Notable for the following components:      Result Value   CO2 19 (*)    Glucose, Bld 257 (*)    BUN 28 (*)    Creatinine, Ser 1.50 (*)    Calcium 8.3 (*)    GFR calc non Af Amer 34 (*)    GFR calc Af Amer 39 (*)    All other components within normal limits  CBC WITH DIFFERENTIAL/PLATELET - Abnormal; Notable for the following components:   WBC 13.0 (*)    RBC 3.72 (*)    Hemoglobin 9.9 (*)    HCT 34.7 (*)    MCHC 28.5 (*)    RDW 15.8 (*)    Platelets 449 (*)    Neutro Abs 8.0 (*)    All other components within normal limits  BRAIN NATRIURETIC PEPTIDE - Abnormal; Notable for the following components:   B Natriuretic Peptide 358.1 (*)    All other components within normal limits  CBG MONITORING, ED - Abnormal; Notable for the following components:   Glucose-Capillary 246 (*)    All other components within normal limits  SARS CORONAVIRUS 2 (HOSPITAL ORDER, [facility])  TROPONIN I (HIGH SENSITIVITY)    EKG  ED ECG REPORT   Date: 11/06/2018 0145  Rate: 113  Rhythm: sinus tachycardia  QRS Axis: normal  Intervals: normal  ST/T Wave abnormalities: diffuse ST elevation  Conduction Disutrbances:none  Narrative Interpretation:   Old EKG Reviewed: changes noted  I have personally reviewed the EKG tracing and agree with the computerized printout as noted.  Radiology Dg Chest Port 1 View  Result Date: 11/06/2018 CLINICAL DATA:  Shortness of breath. EXAM: PORTABLE CHEST 1 VIEW COMPARISON:  Radiographs 10/30/2018 FINDINGS: The heart is upper normal in size. Unchanged mediastinal contours with aortic atherosclerosis. Slight increase in pulmonary edema from prior exam with Kerley B-lines. Small pleural effusions. No pneumothorax or confluent airspace disease. IMPRESSION: Slight increase in pulmonary edema from prior exam. Small pleural effusions. Aortic Atherosclerosis (ICD10-I70.0). Electronically Signed   By: Keith Rake M.D.    On: 11/06/2018 02:04    Procedures .Critical Care Performed by: Ripley Fraise, MD Authorized by: Ripley Fraise, MD   Critical care provider statement:    Critical care time (minutes):  60   Critical care start time:  11/06/2018 2:00 AM   Critical care end time:  11/06/2018 3:00 AM   Critical care time was exclusive of:  Separately billable procedures and treating other patients   Critical care was necessary to treat or prevent imminent or life-threatening deterioration of the following conditions:  Cardiac failure and respiratory failure   Critical care was time spent personally by me on the following activities:  Ordering and review of laboratory studies, ordering and review of radiographic studies, pulse oximetry, re-evaluation of patient's condition, review of old charts, evaluation of patient's response to treatment, development of treatment plan with patient or surrogate and examination of patient   I assumed direction of critical care for this patient from another provider in my specialty: no        Medications Ordered in ED Medications  furosemide (LASIX) injection 40 mg (40 mg Intravenous Given 11/06/18 0227)     Initial Impression / Assessment and Plan / ED Course  I have reviewed the triage vital signs and the nursing notes.  Pertinent labs & imaging results that were available during my care of the patient were reviewed by me and considered in my medical decision making (see chart for details).        2:03 AM Patient presents from home for shortness of breath.  She is somewhat improved after receiving oxygen in route She had a recent admission for GI bleed and required blood transfusion.  She also had episode of pulmonary edema and required BiPAP per daughter.  She also was noted to have pseudoaneurysm on CT imaging, and had successful mesenteric arteriogram and percutaneous embolization  Imaging and labs are pending this time.  Daughter denies any recent blood  loss. Will follow closely 3:13 AM Patient is improved.  She is resting more comfortably and much more talkative.  She appears to have an episode of pulmonary edema.  Suspicious she had episode of flash pulmonary edema at home is now improving, she was given Lasix. Echocardiogram in hospital  revealed diastolic dysfunction Plan to admit.  Discussed with family medicine for admission.  Discussed with daughter via phone. Final Clinical Impressions(s) / ED Diagnoses   Final diagnoses:  Acute pulmonary edema [facility])    ED Discharge Orders    None       Ripley Fraise, MD 11/06/18 [phone]

## 2018-11-06 NOTE — Progress Notes (Signed)
Pt has refused blood sugar to be checked as she reports that she has never been diagnosed with diabetes

## 2018-11-06 NOTE — Progress Notes (Signed)
Patient refused for CBG to be check or for insulin to be administered, per patient she  does not have diabetes. Family medecine MD was at bedside,made aware.

## 2018-11-07 DIAGNOSIS — I509 Heart failure, unspecified: Secondary | ICD-10-CM

## 2018-11-07 DIAGNOSIS — I5032 Chronic diastolic (congestive) heart failure: Secondary | ICD-10-CM

## 2018-11-07 LAB — GLUCOSE, CAPILLARY
Glucose-Capillary: 107 mg/dL — ABNORMAL HIGH (ref 70–99)
Glucose-Capillary: 124 mg/dL — ABNORMAL HIGH (ref 70–99)
Glucose-Capillary: 129 mg/dL — ABNORMAL HIGH (ref 70–99)
Glucose-Capillary: 137 mg/dL — ABNORMAL HIGH (ref 70–99)
Glucose-Capillary: 151 mg/dL — ABNORMAL HIGH (ref 70–99)

## 2018-11-07 LAB — CBC WITH DIFFERENTIAL/PLATELET
Abs Immature Granulocytes: 0.05 10*3/uL (ref 0.00–0.07)
Basophils Absolute: 0 10*3/uL (ref 0.0–0.1)
Basophils Relative: 0 %
Eosinophils Absolute: 0 10*3/uL (ref 0.0–0.5)
Eosinophils Relative: 0 %
HCT: 28 % — ABNORMAL LOW (ref 36.0–46.0)
Hemoglobin: 8.4 g/dL — ABNORMAL LOW (ref 12.0–15.0)
Immature Granulocytes: 1 %
Lymphocytes Relative: 33 %
Lymphs Abs: 2.7 10*3/uL (ref 0.7–4.0)
MCH: 26.8 pg (ref 26.0–34.0)
MCHC: 30 g/dL (ref 30.0–36.0)
MCV: 89.5 fL (ref 80.0–100.0)
Monocytes Absolute: 0.7 10*3/uL (ref 0.1–1.0)
Monocytes Relative: 9 %
Neutro Abs: 4.6 10*3/uL (ref 1.7–7.7)
Neutrophils Relative %: 57 %
Platelets: 274 10*3/uL (ref 150–400)
RBC: 3.13 MIL/uL — ABNORMAL LOW (ref 3.87–5.11)
RDW: 16.2 % — ABNORMAL HIGH (ref 11.5–15.5)
WBC: 8.1 10*3/uL (ref 4.0–10.5)
nRBC: 0 % (ref 0.0–0.2)

## 2018-11-07 LAB — COMPREHENSIVE METABOLIC PANEL
ALT: 11 U/L (ref 0–44)
AST: 18 U/L (ref 15–41)
Albumin: 3.1 g/dL — ABNORMAL LOW (ref 3.5–5.0)
Alkaline Phosphatase: 80 U/L (ref 38–126)
Anion gap: 10 (ref 5–15)
BUN: 42 mg/dL — ABNORMAL HIGH (ref 8–23)
CO2: 25 mmol/L (ref 22–32)
Calcium: 8.5 mg/dL — ABNORMAL LOW (ref 8.9–10.3)
Chloride: 105 mmol/L (ref 98–111)
Creatinine, Ser: 1.56 mg/dL — ABNORMAL HIGH (ref 0.44–1.00)
GFR calc Af Amer: 37 mL/min — ABNORMAL LOW (ref >60)
GFR calc non Af Amer: 32 mL/min — ABNORMAL LOW (ref >60)
Glucose, Bld: 119 mg/dL — ABNORMAL HIGH (ref 70–99)
Potassium: 4 mmol/L (ref 3.5–5.1)
Sodium: 140 mmol/L (ref 135–145)
Total Bilirubin: 0.4 mg/dL (ref 0.3–1.2)
Total Protein: 6.1 g/dL — ABNORMAL LOW (ref 6.5–8.1)

## 2018-11-07 MED ORDER — FUROSEMIDE 10 MG/ML IJ SOLN
40.0000 mg | Freq: Once | INTRAMUSCULAR | Status: AC
  Administered 2018-11-07: 40 mg via INTRAVENOUS
  Filled 2018-11-07: qty 4

## 2018-11-07 MED ORDER — FERROUS SULFATE 325 (65 FE) MG PO TABS
325.0000 mg | ORAL_TABLET | Freq: Every day | ORAL | Status: DC
  Administered 2018-11-08: 09:00:00 325 mg via ORAL
  Filled 2018-11-07: qty 1

## 2018-11-07 NOTE — Discharge Summary (Addendum)
[facility] Discharge Summary  [name] Medical record number: [id number] Date of birth: [birth year] Age: 73 y.o. Gender: female Date of Admission: 11/06/2018  Date of Discharge: 11/08/2018 Admitting Physician: Martyn Malay, MD  Primary Care Provider: [name], MD Consultants: Cardiology  Indication for Hospitalization: Acute dyspnea 2/2 to acute pulmonary edema  Discharge Diagnoses/Problem List:  Previous GI bleed Symptomatic anaemia HTN Gout GERD Anxiety  CKD stage IIIb HLD Dementia  Severe protein calorie malnutrition    Disposition: home with daughter   Discharge Condition: stable  Discharge Exam:  General: Alert and oriented in no apparent distress Heart: Regular rate and rhythm with no murmurs appreciated Lungs: CTA bilaterally, no wheezing Abdomen: Bowel sounds present, no abdominal pain Skin: Warm and dry Extremities: No lower extremity edema   Brief Hospital Course:  Pt admitted for acute dyspnea and then LOC. Etiology of acute dyspnea was not entirely clear. On admission, labs showed Cr 1.5, BUN 28, CO2 19, BNP 358, WBC 13 and Hb 9.9. ACS was ruled out. CXR showed increased pulmonary edema from prior CXR on previous admission. Pt was given IV Lasix X 1 dose and responded well to this diuresis. PE was considered a diagnosis. D dimer 3.41, CTA was negative for PE. Patient's fluid status was monitored and her shortness of breath resolved. During encounter on 7/21 patient was able to walk around her room with no shortness of breath on room air and had no complaints. Patient's hemoglobin was followed during her hospital course as has had a past history of anemia and GI bleed. During this course her hemoglobin did drop from 9.9 on admission to 8.4 the next day but patient was monitored an additional night and her hemoglobin stabilized.  Issues for Follow Up:  1. Please ensure pt follows up with cardiology to  consider stress testing. 2. Pt has had recent GI bleed. Hb in hospital dropped to 8.1 Please monitor Hb on discharge as anemia can make pt more heat sensitive and contribute to dyspnea.  3. Please consider neurology follow up for pt's amnesic syncope episode.   Significant Procedures:   Significant Labs and Imaging:  Recent Labs  Lab 11/06/18 0157 11/07/18 0446 11/08/18 0438  WBC 13.0* 8.1 8.0  HGB 9.9* 8.4* 8.1*  HCT 34.7* 28.0* 26.5*  PLT 449* 274 336   Recent Labs  Lab 11/02/18 0312 11/06/18 0157 11/06/18 0433 11/07/18 0446  NA 138 137  --  140  K 2.9* 4.8  --  4.0  CL 99 103  --  105  CO2 26 19*  --  25  GLUCOSE 139* 257*  --  119*  BUN 23 28*  --  42*  CREATININE 1.26* 1.50*  --  1.56*  CALCIUM 7.8* 8.3*  --  8.5*  MG 1.1*  --  1.7  --   ALKPHOS  --   --   --  80  AST  --   --   --  18  ALT  --   --   --  11  ALBUMIN  --   --   --  3.1*   Ct Angio Chest Pe W Or Wo Contrast  Result Date: 11/06/2018 CLINICAL DATA:  Dyspnea and syncopal episode. Elevated D-dimer. History of DVT. EXAM: CT ANGIOGRAPHY CHEST WITH CONTRAST TECHNIQUE: Multidetector CT imaging of the chest was performed using the standard protocol during bolus administration of intravenous contrast. Multiplanar CT image reconstructions and MIPs were obtained  to evaluate the vascular anatomy. CONTRAST:  90mL OMNIPAQUE IOHEXOL 350 MG/ML SOLN. Reduced dose due to GFR 34. COMPARISON:  Chest CT 06/03/2004 and abdominal CT 10/29/2018 FINDINGS: Cardiovascular: Heart is normal size. There is mild calcified plaque over the left anterior descending coronary artery. Minimal calcified plaque over the thoracic aorta. Thoracic aorta is normal caliber. Pulmonary arterial system is well opacified without evidence of emboli. Mediastinum/Nodes: No mediastinal or hilar adenopathy. Remaining mediastinal structures are within normal. Lungs/Pleura: Lungs are adequately inflated with minimal bibasilar atelectatic change. Subtle hazy  attenuation adjacent the central vasculature likely mild interstitial edema. Small amount right pleural fluid is present. Airways are normal. Upper Abdomen: No acute findings. Minimal calcified plaque over the abdominal aorta. Musculoskeletal: Mild degenerative change of the spine. Review of the MIP images confirms the above findings. IMPRESSION: No evidence of pulmonary embolism. Evidence of mild interstitial edema. Small right pleural effusion. Minimal bibasilar atelectasis. Aortic Atherosclerosis (ICD10-I70.0). Atherosclerotic coronary artery disease. Electronically Signed   By: Marin Olp M.D.   On: 11/06/2018 18:28    Results/Tests Pending at Time of Discharge: none  Discharge Medications:  Allergies as of 11/08/2018   No Known Allergies     Medication List    STOP taking these medications   lisinopril 2.5 MG tablet Commonly known as: ZESTRIL     TAKE these medications   acetaminophen 325 MG tablet Commonly known as: TYLENOL Take 650 mg by mouth every 6 (six) hours as needed for mild pain or headache.   ALPRAZolam 0.25 MG tablet Commonly known as: XANAX TAKE 1 TABLET (0.25 MG TOTAL) BY MOUTH AT BEDTIME AS NEEDED. What changed: reasons to take this   atorvastatin 40 MG tablet Commonly known as: LIPITOR Take 1 tablet (40 mg total) by mouth daily.   carvedilol 12.5 MG tablet Commonly known as: COREG Take 1 tablet (12.5 mg total) by mouth 2 (two) times daily with a meal.   feeding supplement (ENSURE ENLIVE) Liqd Take 237 mLs by mouth 2 (two) times daily between meals.   ferrous sulfate 325 (65 FE) MG tablet Take 325 mg by mouth daily with breakfast.   omeprazole 20 MG capsule Commonly known as: PRILOSEC TAKE 1 CAPSULE BY MOUTH DAILY AS NEEDED What changed:   how much to take  how to take this  when to take this  reasons to take this  additional instructions   sertraline 50 MG tablet Commonly known as: ZOLOFT TAKE 1/2 TABLET BY MOUTH DAILY What changed:  how much to take       Discharge Instructions: Please refer to Patient Instructions section of EMR for full details.  Patient was counseled important signs and symptoms that should prompt return to medical care, changes in medications, dietary instructions, activity restrictions, and follow up appointments.   Follow-Up Appointments: Follow-up Information    [name], MD Follow up.   Specialty: Family Medicine Why: GO: July 28 at 8:50am, Aug 8th at 8:30am Contact information: [place] [address] [phone]           Lurline Del, MD 11/08/2018, 3:54 PM PGY-1, [place]

## 2018-11-07 NOTE — Progress Notes (Addendum)
Family Medicine Teaching Service Daily Progress Note Intern Pager: [phone]  [name] Medical record number: [id number] Date of birth: Jun 15, 1945 Age: 73 y.o. Gender: female  Primary Care Provider: [name], MD Consultants: Cardiology Code Status: Full  Pt Overview and Major Events to Date:  Hannah [name] is a 73 y.o. female presenting with sudden onset shortness of breath.   Assessment and Plan:   Shortness of breath Patient admitted after she had sudden onset of shortness of breath.  The patient history is somewhat limited given that she has known dementia.  She actually describes getting progressively short of breath over the afternoon and evening of 11/05/2018. Her fluid overload likely is secondary to fluid leak secondary to protein malnutrition -Vital signs stable overnight 7/19 on room air -Likely give additional dose of IV Lasix 40 -Strict I's and O's -Daily weights - 76.3kg on admission -Continuous pulse ox -Hold Xanax during admission -Zoloft 74m - D-dimer elevated at 3.41. CTA showed no evidence of pulmonary embolism   Recent admission for GI bleed Had coil embolization for ectasia of trifurcation of GDA.  Doing well from this perspective.  Hemoglobin around 10, greatly improved from admission.  Will hold off on anticoagulant for DVT prophylaxis at this time.  We will do SCDs.  Could potentially restart during admission and monitor hemoglobin.  Takes Prilosec 20 mg daily as needed.  We will switch to pantoprazole 40 mg daily per formulary -Daily CBC -Could consider starting heparin for DVT prophylaxis per admission -Pantoprazole 40 mg daily  Anemia Hemoglobin 9.9 this admission.  Greatly improved from previous admission.  Normal MCV.  Some of this likely dilutional given that her white blood cell count and platelets are also elevated compared to prior study.  We will continue with daily CBCs, true anemia likely due to blood loss  secondary to GI bleed last admission. -Daily CBC - 8.4 on 7/20 -SCDs for DVT prophylaxis - Will monitor CBC in a.m. before discharge  CKD stage IIIb Creatinine on admission was 1.50, GFR 34.  This has worsened slightly since discharge last admission.  Patient's creatinine was 1.25, with GFR 42 at that time.  This is significantly worsened since admission for last admission which her creatinine was around 1, with GFR in the 40s.  Patient likely with some aspect of acute kidney injury given the contrast loads that she received during last admission, and possible dehydration given the hemoconcentration seen on her labs.  This is also been taking her lisinopril as prescribed during this time.  Last albumin 2.9 on 7/12.  Patient is likely intravascularly dehydrated, but does have at some aspect of pulmonary edema.  Will need to monitor very closely given her diuresis. -Daily BMP - Cr. 1.50 on admission, 1.56 7/20. -Avoid nephrotoxic medications -Gentle diuresis -Add on Ensure shakes twice daily for low albumin  Anxiety Takes Xanax 0.25 mg at bedtime as needed.  PCP has been slowly weaning this medication for course of years.  Reviewed PMP aware, last filled on 11/02/2018. Takes Zoloft 25 mg daily.  Patient feels her anxiety did contribute somewhat to her breathing issues.  Wonder she would be a good candidate to go to 50 mg daily of Zoloft.  Will discuss with PCP and make changes as appropriate. -Holding Xanax while admitted for shortness of breath -We will consider going to Zoloft 50 mg daily  Hyperlipidemia Has known microvascular chronic changes so this is likely for secondary prevention so we will continue as  indicated. - Continue atorvastatin 40 mg  Hypertension Blood pressures have been somewhat erratic since admission.  Initially BP was 152/79.  Had a dip to 99/51.  Patient's blood pressure was 133/74 while this provider was in the room examining patient.  Takes lisinopril 2.5 mg, and  carvedilol 12.5 mg twice daily.  Given the patient will be undergoing diuresis will hold lisinopril in hopes of preventing AKI.  -Continue Coreg. Expect hypertension to improve once volume status normalizes. -Continue Coreg 12.5 mg twice daily -Hold lisinopril -BP overnight 7/20 showed 102-117/38-58  Dementia Per PCP notes patient has had progressive intermittent dementia.  CT head back in February 2020 did show mild microvascular ischemic disease without any acute process.  Patient's daughter is healthcare power of attorney.  Patient alert and oriented x4 from this provider but this appears to wax and wane based on past notes.  Question of speech eval for possible [place] would be helpful.  History of elevated TSH Last TSH 4.9 back in January 2020.  Patient asymptomatic.  Per PCP last note, check in 4 months.  We will recheck during hospitalization.  Could potentially be a cause of symptoms if significantly elevated. -Check TSH  Hyperglycemia Patient with CBG 257 on admission.  Review of chart reveals no recent A1c.   - Sensitive sliding scale insulin - Check A1c  Hypomagnesemia Magnesium 1.1 on day of discharge last admission.  Not rechecked this admission.  -Replete as necessary.  Severe protein calorie malnutrition Albumin 2.7 back on 7/12.  Will add on Ensure shakes to supplement nutrition. - Monitor  FEN/GI: Heart healthy PPx: SCDs for DVT prophylaxis in setting of recent admission for GI bleed  Disposition: Pending medical workup  Subjective:  Patient without complaints on room air. Says she had a headache yesterday but this has improved. Says she things her "anxiety got the best of her" with regard to her shortness of breath. Cardiology yesterday ordered CTA to rule out PE due to elevated d-dimer. Patient did have a hemoglobin drop to 8.4 from 9.9 yesterday. She denies blood in stool, dark stools, or pain.  Objective: Temp:  [97.5 F (36.4 C)-98.6 F (37 C)] 98.6 F  (37 C) (07/20 0037) Pulse Rate:  [70-82] 82 (07/20 0037) Resp:  [18-20] 18 (07/20 0050) BP: (102-117)/(38-76) 116/58 (07/20 0037) SpO2:  [92 %-99 %] 95 % (07/20 0050) Physical Exam: General: Alert and oriented in no apparent distress Heart: Regular rate and rhythm with no murmurs appreciated Lungs: CTA bilaterally, no wheezing Abdomen: Bowel sounds present, no abdominal pain Skin: Warm and dry Extremities: No lower extremity edema   Laboratory: Recent Labs  Lab 11/02/18 0312 11/06/18 0157 11/07/18 0446  WBC 8.0 13.0* 8.1  HGB 7.5* 9.9* 8.4*  HCT 24.9* 34.7* 28.0*  PLT 231 449* PENDING   Recent Labs  Lab 11/02/18 0312 11/06/18 0157 11/07/18 0446  NA 138 137 140  K 2.9* 4.8 4.0  CL 99 103 105  CO2 26 19* 25  BUN 23 28* 42*  CREATININE 1.26* 1.50* 1.56*  CALCIUM 7.8* 8.3* 8.5*  PROT  --   --  6.1*  BILITOT  --   --  0.4  ALKPHOS  --   --  80  ALT  --   --  11  AST  --   --  18  GLUCOSE 139* 257* 119*     Imaging/Diagnostic Tests: Ct Angio Chest Pe W Or Wo Contrast  Result Date: 11/06/2018 CLINICAL DATA:  Dyspnea and syncopal episode.  Elevated D-dimer. History of DVT. EXAM: CT ANGIOGRAPHY CHEST WITH CONTRAST TECHNIQUE: Multidetector CT imaging of the chest was performed using the standard protocol during bolus administration of intravenous contrast. Multiplanar CT image reconstructions and MIPs were obtained to evaluate the vascular anatomy. CONTRAST:  46m OMNIPAQUE IOHEXOL 350 MG/ML SOLN. Reduced dose due to GFR 34. COMPARISON:  Chest CT 06/03/2004 and abdominal CT 10/29/2018 FINDINGS: Cardiovascular: Heart is normal size. There is mild calcified plaque over the left anterior descending coronary artery. Minimal calcified plaque over the thoracic aorta. Thoracic aorta is normal caliber. Pulmonary arterial system is well opacified without evidence of emboli. Mediastinum/Nodes: No mediastinal or hilar adenopathy. Remaining mediastinal structures are within normal.  Lungs/Pleura: Lungs are adequately inflated with minimal bibasilar atelectatic change. Subtle hazy attenuation adjacent the central vasculature likely mild interstitial edema. Small amount right pleural fluid is present. Airways are normal. Upper Abdomen: No acute findings. Minimal calcified plaque over the abdominal aorta. Musculoskeletal: Mild degenerative change of the spine. Review of the MIP images confirms the above findings. IMPRESSION: No evidence of pulmonary embolism. Evidence of mild interstitial edema. Small right pleural effusion. Minimal bibasilar atelectasis. Aortic Atherosclerosis (ICD10-I70.0). Atherosclerotic coronary artery disease. Electronically Signed   By: DMarin OlpM.D.   On: 11/06/2018 18:28   Ir Angiogram Visceral Selective  Result Date: 10/30/2018 INDICATION: Recent endoscopy with active extravasation from duodenal polyp with subsequent CTA demonstrating focal ectasia of the distal trifurcation of the GDA. Request made for mesenteric arteriogram and potential percutaneous coil embolization. EXAM: 1. ULTRASOUND GUIDANCE FOR ARTERIAL ACCESS 2. CELIAC ARTERIOGRAM (1st ORDER) 3. SELECTIVE GASTRO EPIPLOIC ARTERIOGRAM AND PERCUTANEOUS COIL EMBOLIZATION. 4. SELECTIVE ANTERIOR AND POSTERIOR DIVISIONS OF THE PANCREATICODUODENAL ARTERY AND PERCUTANEOUS COIL EMBOLIZATION. 5. SELECTIVE GASTRODUODENAL ARTERIOGRAM AND PERCUTANEOUS COIL EMBOLIZATION COMPARISON:  CTA abdomen and pelvis - 10/29/2018 MEDICATIONS: None ANESTHESIA/SEDATION: Moderate (conscious) sedation was employed during this procedure. A total of Versed 1 mg and Fentanyl 50 mcg was administered intravenously. Moderate Sedation Time: 70 minutes. The patient's level of consciousness and vital signs were monitored continuously by radiology nursing throughout the procedure under my direct supervision. CONTRAST:  75 cc Isovue-300 FLUOROSCOPY TIME:  20 minutes, 36 seconds (12,706mGy) COMPLICATIONS: None immediate. PROCEDURE: Informed  consent was obtained from the patient's daughter following explanation of the procedure, risks, benefits and alternatives. The patient understands, agrees and consents for the procedure. All questions were addressed. A time out was performed prior to the initiation of the procedure. Maximal barrier sterile technique utilized including caps, mask, sterile gowns, sterile gloves, large sterile drape, hand hygiene, and Betadine prep. The right femoral head was marked fluoroscopically. Under sterile conditions and local anesthesia, the right common femoral artery access was performed with a micropuncture needle. Under direct ultrasound guidance, the right common femoral was accessed with a micropuncture kit. An ultrasound image was saved for documentation purposes. This allowed for placement of a 5-French vascular sheath. A limited arteriogram was performed through the side arm of the sheath confirming appropriate access within the right common femoral artery. Mickelson catheter was advanced to the level of the thoracic aorta where was reformed, back bled and flushed. Mickelson catheter was then utilized to select the celiac artery and a selective celiac arteriogram was performed. With the use of a fathom 14 microwire, a regular Renegade microcatheter was advanced into the gastroduodenal artery and a selective gastroduodenal arteriogram was performed. Next, the microcatheter was advanced into the posterior division of the pancreaticoduodenal artery. Selective arteriogram was performed and the vessel was percutaneously  coil embolized to near its origin with multiple overlapping 4, 5 and 6 mm diameter interlock coils. Next, the microcatheter was advanced into the gastroepiploic artery and a selective gastroepiploic arteriogram was performed. The vessel within percutaneously coil embolized to near its origin with multiple overlapping 6 mm diameter interlock coils. During deployment of the final coil, there was difficulty  removing the pushing wire, ultimately resulting in damage to the microcatheter requiring replacement. With the use of a fathom 14 microwire, microcatheter was utilized to select the anterior division of the pancreaticoduodenal artery. Selective arteriogram was performed and the vessel was percutaneously coil embolized to near its origin with multiple overlapping 4 and 5 mm diameter interlock coils. Next, the microcatheter was retracted into the distal aspect of the GDA and a selective arteriogram was performed. The focally ectatic segment of the distal aspect of the GDA and it's distal aspect was then percutaneously coil embolized with multiple overlapping 8 mm and 6 mm diameter interlock coils to the level of its more proximal branch division. The microcatheter was retracted into the proximal GDA as well as the common hepatic artery, and completion arteriograms were performed Images were reviewed and the procedure was terminated. At this point, all wires, catheters and sheaths were removed from the patient. Hemostasis was achieved at the right groin access site with deployment of an ExoSeal closure device and manual compression. The patient tolerated the procedure well without immediate post procedural complication. FINDINGS: Selective celiac arteriogram demonstrates conventional takeoff of the GDA. Redemonstrated focally ectatic segment of the distal aspect of the GDA at the level of its trifurcation as was seen on preceding CTA. Note is again made of additional branch of the GDA at its mid aspect however this vessel does not contribute to the ectatic portion of the more distal aspect of the GDA. Following selective arteriogram and percutaneous coil embolization of the gastroepiploic, the anterior and posterior divisions of the pancreaticoduodenal artery and the distal aspect of the GDA, there is complete occlusion of the ectatic segment of the GDA without persistent flow. The more proximal branch of the GDA, not  contributing to the distal ectatic segment, remains patent. IMPRESSION: Technically successful percutaneous coil embolization of ectatic segment of the distal GDA at the level of its trifurcation. PLAN: - Continued conservative management with daily CBCs as per the primary team. - Repeat endoscopy may be performed at the discretion GI service as clinically indicated. Electronically Signed   By: Sandi Mariscal M.D.   On: 10/30/2018 11:54   Ir Angiogram Selective Each Additional Vessel  Result Date: 10/30/2018 INDICATION: Recent endoscopy with active extravasation from duodenal polyp with subsequent CTA demonstrating focal ectasia of the distal trifurcation of the GDA. Request made for mesenteric arteriogram and potential percutaneous coil embolization. EXAM: 1. ULTRASOUND GUIDANCE FOR ARTERIAL ACCESS 2. CELIAC ARTERIOGRAM (1st ORDER) 3. SELECTIVE GASTRO EPIPLOIC ARTERIOGRAM AND PERCUTANEOUS COIL EMBOLIZATION. 4. SELECTIVE ANTERIOR AND POSTERIOR DIVISIONS OF THE PANCREATICODUODENAL ARTERY AND PERCUTANEOUS COIL EMBOLIZATION. 5. SELECTIVE GASTRODUODENAL ARTERIOGRAM AND PERCUTANEOUS COIL EMBOLIZATION COMPARISON:  CTA abdomen and pelvis - 10/29/2018 MEDICATIONS: None ANESTHESIA/SEDATION: Moderate (conscious) sedation was employed during this procedure. A total of Versed 1 mg and Fentanyl 50 mcg was administered intravenously. Moderate Sedation Time: 70 minutes. The patient's level of consciousness and vital signs were monitored continuously by radiology nursing throughout the procedure under my direct supervision. CONTRAST:  75 cc Isovue-300 FLUOROSCOPY TIME:  20 minutes, 36 seconds (8,338 mGy) COMPLICATIONS: None immediate. PROCEDURE: Informed consent was obtained from the patient's  daughter following explanation of the procedure, risks, benefits and alternatives. The patient understands, agrees and consents for the procedure. All questions were addressed. A time out was performed prior to the initiation of the  procedure. Maximal barrier sterile technique utilized including caps, mask, sterile gowns, sterile gloves, large sterile drape, hand hygiene, and Betadine prep. The right femoral head was marked fluoroscopically. Under sterile conditions and local anesthesia, the right common femoral artery access was performed with a micropuncture needle. Under direct ultrasound guidance, the right common femoral was accessed with a micropuncture kit. An ultrasound image was saved for documentation purposes. This allowed for placement of a 5-French vascular sheath. A limited arteriogram was performed through the side arm of the sheath confirming appropriate access within the right common femoral artery. Mickelson catheter was advanced to the level of the thoracic aorta where was reformed, back bled and flushed. Mickelson catheter was then utilized to select the celiac artery and a selective celiac arteriogram was performed. With the use of a fathom 14 microwire, a regular Renegade microcatheter was advanced into the gastroduodenal artery and a selective gastroduodenal arteriogram was performed. Next, the microcatheter was advanced into the posterior division of the pancreaticoduodenal artery. Selective arteriogram was performed and the vessel was percutaneously coil embolized to near its origin with multiple overlapping 4, 5 and 6 mm diameter interlock coils. Next, the microcatheter was advanced into the gastroepiploic artery and a selective gastroepiploic arteriogram was performed. The vessel within percutaneously coil embolized to near its origin with multiple overlapping 6 mm diameter interlock coils. During deployment of the final coil, there was difficulty removing the pushing wire, ultimately resulting in damage to the microcatheter requiring replacement. With the use of a fathom 14 microwire, microcatheter was utilized to select the anterior division of the pancreaticoduodenal artery. Selective arteriogram was performed and  the vessel was percutaneously coil embolized to near its origin with multiple overlapping 4 and 5 mm diameter interlock coils. Next, the microcatheter was retracted into the distal aspect of the GDA and a selective arteriogram was performed. The focally ectatic segment of the distal aspect of the GDA and it's distal aspect was then percutaneously coil embolized with multiple overlapping 8 mm and 6 mm diameter interlock coils to the level of its more proximal branch division. The microcatheter was retracted into the proximal GDA as well as the common hepatic artery, and completion arteriograms were performed Images were reviewed and the procedure was terminated. At this point, all wires, catheters and sheaths were removed from the patient. Hemostasis was achieved at the right groin access site with deployment of an ExoSeal closure device and manual compression. The patient tolerated the procedure well without immediate post procedural complication. FINDINGS: Selective celiac arteriogram demonstrates conventional takeoff of the GDA. Redemonstrated focally ectatic segment of the distal aspect of the GDA at the level of its trifurcation as was seen on preceding CTA. Note is again made of additional branch of the GDA at its mid aspect however this vessel does not contribute to the ectatic portion of the more distal aspect of the GDA. Following selective arteriogram and percutaneous coil embolization of the gastroepiploic, the anterior and posterior divisions of the pancreaticoduodenal artery and the distal aspect of the GDA, there is complete occlusion of the ectatic segment of the GDA without persistent flow. The more proximal branch of the GDA, not contributing to the distal ectatic segment, remains patent. IMPRESSION: Technically successful percutaneous coil embolization of ectatic segment of the distal GDA at the level  of its trifurcation. PLAN: - Continued conservative management with daily CBCs as per the primary  team. - Repeat endoscopy may be performed at the discretion GI service as clinically indicated. Electronically Signed   By: Sandi Mariscal M.D.   On: 10/30/2018 11:54   Ir Angiogram Selective Each Additional Vessel  Result Date: 10/30/2018 INDICATION: Recent endoscopy with active extravasation from duodenal polyp with subsequent CTA demonstrating focal ectasia of the distal trifurcation of the GDA. Request made for mesenteric arteriogram and potential percutaneous coil embolization. EXAM: 1. ULTRASOUND GUIDANCE FOR ARTERIAL ACCESS 2. CELIAC ARTERIOGRAM (1st ORDER) 3. SELECTIVE GASTRO EPIPLOIC ARTERIOGRAM AND PERCUTANEOUS COIL EMBOLIZATION. 4. SELECTIVE ANTERIOR AND POSTERIOR DIVISIONS OF THE PANCREATICODUODENAL ARTERY AND PERCUTANEOUS COIL EMBOLIZATION. 5. SELECTIVE GASTRODUODENAL ARTERIOGRAM AND PERCUTANEOUS COIL EMBOLIZATION COMPARISON:  CTA abdomen and pelvis - 10/29/2018 MEDICATIONS: None ANESTHESIA/SEDATION: Moderate (conscious) sedation was employed during this procedure. A total of Versed 1 mg and Fentanyl 50 mcg was administered intravenously. Moderate Sedation Time: 70 minutes. The patient's level of consciousness and vital signs were monitored continuously by radiology nursing throughout the procedure under my direct supervision. CONTRAST:  75 cc Isovue-300 FLUOROSCOPY TIME:  20 minutes, 36 seconds (3,570 mGy) COMPLICATIONS: None immediate. PROCEDURE: Informed consent was obtained from the patient's daughter following explanation of the procedure, risks, benefits and alternatives. The patient understands, agrees and consents for the procedure. All questions were addressed. A time out was performed prior to the initiation of the procedure. Maximal barrier sterile technique utilized including caps, mask, sterile gowns, sterile gloves, large sterile drape, hand hygiene, and Betadine prep. The right femoral head was marked fluoroscopically. Under sterile conditions and local anesthesia, the right common  femoral artery access was performed with a micropuncture needle. Under direct ultrasound guidance, the right common femoral was accessed with a micropuncture kit. An ultrasound image was saved for documentation purposes. This allowed for placement of a 5-French vascular sheath. A limited arteriogram was performed through the side arm of the sheath confirming appropriate access within the right common femoral artery. Mickelson catheter was advanced to the level of the thoracic aorta where was reformed, back bled and flushed. Mickelson catheter was then utilized to select the celiac artery and a selective celiac arteriogram was performed. With the use of a fathom 14 microwire, a regular Renegade microcatheter was advanced into the gastroduodenal artery and a selective gastroduodenal arteriogram was performed. Next, the microcatheter was advanced into the posterior division of the pancreaticoduodenal artery. Selective arteriogram was performed and the vessel was percutaneously coil embolized to near its origin with multiple overlapping 4, 5 and 6 mm diameter interlock coils. Next, the microcatheter was advanced into the gastroepiploic artery and a selective gastroepiploic arteriogram was performed. The vessel within percutaneously coil embolized to near its origin with multiple overlapping 6 mm diameter interlock coils. During deployment of the final coil, there was difficulty removing the pushing wire, ultimately resulting in damage to the microcatheter requiring replacement. With the use of a fathom 14 microwire, microcatheter was utilized to select the anterior division of the pancreaticoduodenal artery. Selective arteriogram was performed and the vessel was percutaneously coil embolized to near its origin with multiple overlapping 4 and 5 mm diameter interlock coils. Next, the microcatheter was retracted into the distal aspect of the GDA and a selective arteriogram was performed. The focally ectatic segment of the  distal aspect of the GDA and it's distal aspect was then percutaneously coil embolized with multiple overlapping 8 mm and 6 mm diameter interlock coils to the  level of its more proximal branch division. The microcatheter was retracted into the proximal GDA as well as the common hepatic artery, and completion arteriograms were performed Images were reviewed and the procedure was terminated. At this point, all wires, catheters and sheaths were removed from the patient. Hemostasis was achieved at the right groin access site with deployment of an ExoSeal closure device and manual compression. The patient tolerated the procedure well without immediate post procedural complication. FINDINGS: Selective celiac arteriogram demonstrates conventional takeoff of the GDA. Redemonstrated focally ectatic segment of the distal aspect of the GDA at the level of its trifurcation as was seen on preceding CTA. Note is again made of additional branch of the GDA at its mid aspect however this vessel does not contribute to the ectatic portion of the more distal aspect of the GDA. Following selective arteriogram and percutaneous coil embolization of the gastroepiploic, the anterior and posterior divisions of the pancreaticoduodenal artery and the distal aspect of the GDA, there is complete occlusion of the ectatic segment of the GDA without persistent flow. The more proximal branch of the GDA, not contributing to the distal ectatic segment, remains patent. IMPRESSION: Technically successful percutaneous coil embolization of ectatic segment of the distal GDA at the level of its trifurcation. PLAN: - Continued conservative management with daily CBCs as per the primary team. - Repeat endoscopy may be performed at the discretion GI service as clinically indicated. Electronically Signed   By: Sandi Mariscal M.D.   On: 10/30/2018 11:54   Ir Angiogram Selective Each Additional Vessel  Result Date: 10/30/2018 INDICATION: Recent endoscopy with  active extravasation from duodenal polyp with subsequent CTA demonstrating focal ectasia of the distal trifurcation of the GDA. Request made for mesenteric arteriogram and potential percutaneous coil embolization. EXAM: 1. ULTRASOUND GUIDANCE FOR ARTERIAL ACCESS 2. CELIAC ARTERIOGRAM (1st ORDER) 3. SELECTIVE GASTRO EPIPLOIC ARTERIOGRAM AND PERCUTANEOUS COIL EMBOLIZATION. 4. SELECTIVE ANTERIOR AND POSTERIOR DIVISIONS OF THE PANCREATICODUODENAL ARTERY AND PERCUTANEOUS COIL EMBOLIZATION. 5. SELECTIVE GASTRODUODENAL ARTERIOGRAM AND PERCUTANEOUS COIL EMBOLIZATION COMPARISON:  CTA abdomen and pelvis - 10/29/2018 MEDICATIONS: None ANESTHESIA/SEDATION: Moderate (conscious) sedation was employed during this procedure. A total of Versed 1 mg and Fentanyl 50 mcg was administered intravenously. Moderate Sedation Time: 70 minutes. The patient's level of consciousness and vital signs were monitored continuously by radiology nursing throughout the procedure under my direct supervision. CONTRAST:  75 cc Isovue-300 FLUOROSCOPY TIME:  20 minutes, 36 seconds (6,659 mGy) COMPLICATIONS: None immediate. PROCEDURE: Informed consent was obtained from the patient's daughter following explanation of the procedure, risks, benefits and alternatives. The patient understands, agrees and consents for the procedure. All questions were addressed. A time out was performed prior to the initiation of the procedure. Maximal barrier sterile technique utilized including caps, mask, sterile gowns, sterile gloves, large sterile drape, hand hygiene, and Betadine prep. The right femoral head was marked fluoroscopically. Under sterile conditions and local anesthesia, the right common femoral artery access was performed with a micropuncture needle. Under direct ultrasound guidance, the right common femoral was accessed with a micropuncture kit. An ultrasound image was saved for documentation purposes. This allowed for placement of a 5-French vascular sheath. A  limited arteriogram was performed through the side arm of the sheath confirming appropriate access within the right common femoral artery. Mickelson catheter was advanced to the level of the thoracic aorta where was reformed, back bled and flushed. Mickelson catheter was then utilized to select the celiac artery and a selective celiac arteriogram was performed. With the  use of a fathom 14 microwire, a regular Renegade microcatheter was advanced into the gastroduodenal artery and a selective gastroduodenal arteriogram was performed. Next, the microcatheter was advanced into the posterior division of the pancreaticoduodenal artery. Selective arteriogram was performed and the vessel was percutaneously coil embolized to near its origin with multiple overlapping 4, 5 and 6 mm diameter interlock coils. Next, the microcatheter was advanced into the gastroepiploic artery and a selective gastroepiploic arteriogram was performed. The vessel within percutaneously coil embolized to near its origin with multiple overlapping 6 mm diameter interlock coils. During deployment of the final coil, there was difficulty removing the pushing wire, ultimately resulting in damage to the microcatheter requiring replacement. With the use of a fathom 14 microwire, microcatheter was utilized to select the anterior division of the pancreaticoduodenal artery. Selective arteriogram was performed and the vessel was percutaneously coil embolized to near its origin with multiple overlapping 4 and 5 mm diameter interlock coils. Next, the microcatheter was retracted into the distal aspect of the GDA and a selective arteriogram was performed. The focally ectatic segment of the distal aspect of the GDA and it's distal aspect was then percutaneously coil embolized with multiple overlapping 8 mm and 6 mm diameter interlock coils to the level of its more proximal branch division. The microcatheter was retracted into the proximal GDA as well as the common  hepatic artery, and completion arteriograms were performed Images were reviewed and the procedure was terminated. At this point, all wires, catheters and sheaths were removed from the patient. Hemostasis was achieved at the right groin access site with deployment of an ExoSeal closure device and manual compression. The patient tolerated the procedure well without immediate post procedural complication. FINDINGS: Selective celiac arteriogram demonstrates conventional takeoff of the GDA. Redemonstrated focally ectatic segment of the distal aspect of the GDA at the level of its trifurcation as was seen on preceding CTA. Note is again made of additional branch of the GDA at its mid aspect however this vessel does not contribute to the ectatic portion of the more distal aspect of the GDA. Following selective arteriogram and percutaneous coil embolization of the gastroepiploic, the anterior and posterior divisions of the pancreaticoduodenal artery and the distal aspect of the GDA, there is complete occlusion of the ectatic segment of the GDA without persistent flow. The more proximal branch of the GDA, not contributing to the distal ectatic segment, remains patent. IMPRESSION: Technically successful percutaneous coil embolization of ectatic segment of the distal GDA at the level of its trifurcation. PLAN: - Continued conservative management with daily CBCs as per the primary team. - Repeat endoscopy may be performed at the discretion GI service as clinically indicated. Electronically Signed   By: Sandi Mariscal M.D.   On: 10/30/2018 11:54   Ir Angiogram Selective Each Additional Vessel  Result Date: 10/30/2018 INDICATION: Recent endoscopy with active extravasation from duodenal polyp with subsequent CTA demonstrating focal ectasia of the distal trifurcation of the GDA. Request made for mesenteric arteriogram and potential percutaneous coil embolization. EXAM: 1. ULTRASOUND GUIDANCE FOR ARTERIAL ACCESS 2. CELIAC  ARTERIOGRAM (1st ORDER) 3. SELECTIVE GASTRO EPIPLOIC ARTERIOGRAM AND PERCUTANEOUS COIL EMBOLIZATION. 4. SELECTIVE ANTERIOR AND POSTERIOR DIVISIONS OF THE PANCREATICODUODENAL ARTERY AND PERCUTANEOUS COIL EMBOLIZATION. 5. SELECTIVE GASTRODUODENAL ARTERIOGRAM AND PERCUTANEOUS COIL EMBOLIZATION COMPARISON:  CTA abdomen and pelvis - 10/29/2018 MEDICATIONS: None ANESTHESIA/SEDATION: Moderate (conscious) sedation was employed during this procedure. A total of Versed 1 mg and Fentanyl 50 mcg was administered intravenously. Moderate Sedation Time: 70 minutes. The  patient's level of consciousness and vital signs were monitored continuously by radiology nursing throughout the procedure under my direct supervision. CONTRAST:  75 cc Isovue-300 FLUOROSCOPY TIME:  20 minutes, 36 seconds (2,035 mGy) COMPLICATIONS: None immediate. PROCEDURE: Informed consent was obtained from the patient's daughter following explanation of the procedure, risks, benefits and alternatives. The patient understands, agrees and consents for the procedure. All questions were addressed. A time out was performed prior to the initiation of the procedure. Maximal barrier sterile technique utilized including caps, mask, sterile gowns, sterile gloves, large sterile drape, hand hygiene, and Betadine prep. The right femoral head was marked fluoroscopically. Under sterile conditions and local anesthesia, the right common femoral artery access was performed with a micropuncture needle. Under direct ultrasound guidance, the right common femoral was accessed with a micropuncture kit. An ultrasound image was saved for documentation purposes. This allowed for placement of a 5-French vascular sheath. A limited arteriogram was performed through the side arm of the sheath confirming appropriate access within the right common femoral artery. Mickelson catheter was advanced to the level of the thoracic aorta where was reformed, back bled and flushed. Mickelson catheter was  then utilized to select the celiac artery and a selective celiac arteriogram was performed. With the use of a fathom 14 microwire, a regular Renegade microcatheter was advanced into the gastroduodenal artery and a selective gastroduodenal arteriogram was performed. Next, the microcatheter was advanced into the posterior division of the pancreaticoduodenal artery. Selective arteriogram was performed and the vessel was percutaneously coil embolized to near its origin with multiple overlapping 4, 5 and 6 mm diameter interlock coils. Next, the microcatheter was advanced into the gastroepiploic artery and a selective gastroepiploic arteriogram was performed. The vessel within percutaneously coil embolized to near its origin with multiple overlapping 6 mm diameter interlock coils. During deployment of the final coil, there was difficulty removing the pushing wire, ultimately resulting in damage to the microcatheter requiring replacement. With the use of a fathom 14 microwire, microcatheter was utilized to select the anterior division of the pancreaticoduodenal artery. Selective arteriogram was performed and the vessel was percutaneously coil embolized to near its origin with multiple overlapping 4 and 5 mm diameter interlock coils. Next, the microcatheter was retracted into the distal aspect of the GDA and a selective arteriogram was performed. The focally ectatic segment of the distal aspect of the GDA and it's distal aspect was then percutaneously coil embolized with multiple overlapping 8 mm and 6 mm diameter interlock coils to the level of its more proximal branch division. The microcatheter was retracted into the proximal GDA as well as the common hepatic artery, and completion arteriograms were performed Images were reviewed and the procedure was terminated. At this point, all wires, catheters and sheaths were removed from the patient. Hemostasis was achieved at the right groin access site with deployment of an  ExoSeal closure device and manual compression. The patient tolerated the procedure well without immediate post procedural complication. FINDINGS: Selective celiac arteriogram demonstrates conventional takeoff of the GDA. Redemonstrated focally ectatic segment of the distal aspect of the GDA at the level of its trifurcation as was seen on preceding CTA. Note is again made of additional branch of the GDA at its mid aspect however this vessel does not contribute to the ectatic portion of the more distal aspect of the GDA. Following selective arteriogram and percutaneous coil embolization of the gastroepiploic, the anterior and posterior divisions of the pancreaticoduodenal artery and the distal aspect of the GDA, there  is complete occlusion of the ectatic segment of the GDA without persistent flow. The more proximal branch of the GDA, not contributing to the distal ectatic segment, remains patent. IMPRESSION: Technically successful percutaneous coil embolization of ectatic segment of the distal GDA at the level of its trifurcation. PLAN: - Continued conservative management with daily CBCs as per the primary team. - Repeat endoscopy may be performed at the discretion GI service as clinically indicated. Electronically Signed   By: Sandi Mariscal M.D.   On: 10/30/2018 11:54   Ir US Guidance  Result Date: 10/30/2018 INDICATION: Recent endoscopy with active extravasation from duodenal polyp with subsequent CTA demonstrating focal ectasia of the distal trifurcation of the GDA. Request made for mesenteric arteriogram and potential percutaneous coil embolization. EXAM: 1. ULTRASOUND GUIDANCE FOR ARTERIAL ACCESS 2. CELIAC ARTERIOGRAM (1st ORDER) 3. SELECTIVE GASTRO EPIPLOIC ARTERIOGRAM AND PERCUTANEOUS COIL EMBOLIZATION. 4. SELECTIVE ANTERIOR AND POSTERIOR DIVISIONS OF THE PANCREATICODUODENAL ARTERY AND PERCUTANEOUS COIL EMBOLIZATION. 5. SELECTIVE GASTRODUODENAL ARTERIOGRAM AND PERCUTANEOUS COIL EMBOLIZATION COMPARISON:  CTA  abdomen and pelvis - 10/29/2018 MEDICATIONS: None ANESTHESIA/SEDATION: Moderate (conscious) sedation was employed during this procedure. A total of Versed 1 mg and Fentanyl 50 mcg was administered intravenously. Moderate Sedation Time: 70 minutes. The patient's level of consciousness and vital signs were monitored continuously by radiology nursing throughout the procedure under my direct supervision. CONTRAST:  75 cc Isovue-300 FLUOROSCOPY TIME:  20 minutes, 36 seconds (8,937 mGy) COMPLICATIONS: None immediate. PROCEDURE: Informed consent was obtained from the patient's daughter following explanation of the procedure, risks, benefits and alternatives. The patient understands, agrees and consents for the procedure. All questions were addressed. A time out was performed prior to the initiation of the procedure. Maximal barrier sterile technique utilized including caps, mask, sterile gowns, sterile gloves, large sterile drape, hand hygiene, and Betadine prep. The right femoral head was marked fluoroscopically. Under sterile conditions and local anesthesia, the right common femoral artery access was performed with a micropuncture needle. Under direct ultrasound guidance, the right common femoral was accessed with a micropuncture kit. An ultrasound image was saved for documentation purposes. This allowed for placement of a 5-French vascular sheath. A limited arteriogram was performed through the side arm of the sheath confirming appropriate access within the right common femoral artery. Mickelson catheter was advanced to the level of the thoracic aorta where was reformed, back bled and flushed. Mickelson catheter was then utilized to select the celiac artery and a selective celiac arteriogram was performed. With the use of a fathom 14 microwire, a regular Renegade microcatheter was advanced into the gastroduodenal artery and a selective gastroduodenal arteriogram was performed. Next, the microcatheter was advanced into  the posterior division of the pancreaticoduodenal artery. Selective arteriogram was performed and the vessel was percutaneously coil embolized to near its origin with multiple overlapping 4, 5 and 6 mm diameter interlock coils. Next, the microcatheter was advanced into the gastroepiploic artery and a selective gastroepiploic arteriogram was performed. The vessel within percutaneously coil embolized to near its origin with multiple overlapping 6 mm diameter interlock coils. During deployment of the final coil, there was difficulty removing the pushing wire, ultimately resulting in damage to the microcatheter requiring replacement. With the use of a fathom 14 microwire, microcatheter was utilized to select the anterior division of the pancreaticoduodenal artery. Selective arteriogram was performed and the vessel was percutaneously coil embolized to near its origin with multiple overlapping 4 and 5 mm diameter interlock coils. Next, the microcatheter was retracted into the distal aspect of  the GDA and a selective arteriogram was performed. The focally ectatic segment of the distal aspect of the GDA and it's distal aspect was then percutaneously coil embolized with multiple overlapping 8 mm and 6 mm diameter interlock coils to the level of its more proximal branch division. The microcatheter was retracted into the proximal GDA as well as the common hepatic artery, and completion arteriograms were performed Images were reviewed and the procedure was terminated. At this point, all wires, catheters and sheaths were removed from the patient. Hemostasis was achieved at the right groin access site with deployment of an ExoSeal closure device and manual compression. The patient tolerated the procedure well without immediate post procedural complication. FINDINGS: Selective celiac arteriogram demonstrates conventional takeoff of the GDA. Redemonstrated focally ectatic segment of the distal aspect of the GDA at the level of its  trifurcation as was seen on preceding CTA. Note is again made of additional branch of the GDA at its mid aspect however this vessel does not contribute to the ectatic portion of the more distal aspect of the GDA. Following selective arteriogram and percutaneous coil embolization of the gastroepiploic, the anterior and posterior divisions of the pancreaticoduodenal artery and the distal aspect of the GDA, there is complete occlusion of the ectatic segment of the GDA without persistent flow. The more proximal branch of the GDA, not contributing to the distal ectatic segment, remains patent. IMPRESSION: Technically successful percutaneous coil embolization of ectatic segment of the distal GDA at the level of its trifurcation. PLAN: - Continued conservative management with daily CBCs as per the primary team. - Repeat endoscopy may be performed at the discretion GI service as clinically indicated. Electronically Signed   By: Sandi Mariscal M.D.   On: 10/30/2018 11:54   Dg Chest Port 1 View  Result Date: 11/06/2018 CLINICAL DATA:  Shortness of breath. EXAM: PORTABLE CHEST 1 VIEW COMPARISON:  Radiographs 10/30/2018 FINDINGS: The heart is upper normal in size. Unchanged mediastinal contours with aortic atherosclerosis. Slight increase in pulmonary edema from prior exam with Kerley B-lines. Small pleural effusions. No pneumothorax or confluent airspace disease. IMPRESSION: Slight increase in pulmonary edema from prior exam. Small pleural effusions. Aortic Atherosclerosis (ICD10-I70.0). Electronically Signed   By: Keith Rake M.D.   On: 11/06/2018 02:04   Dg Chest Port 1 View  Result Date: 10/30/2018 CLINICAL DATA:  Chest pain and shortness of breath. EXAM: PORTABLE CHEST 1 VIEW COMPARISON:  Single-view of the chest 10/27/2018 and 06/14/2016. FINDINGS: Pulmonary edema appears somewhat worse than on the most recent comparison. Heart size is upper normal. No pneumothorax or pleural effusion. Aortic atherosclerosis  noted. No acute bony abnormality. IMPRESSION: Pulmonary edema appears somewhat worse than on the most recent comparison. No other change. Atherosclerosis. Electronically Signed   By: Inge Rise M.D.   On: 10/30/2018 16:52   Dg Chest Port 1 View  Result Date: 10/27/2018 CLINICAL DATA:  Initial evaluation for acute chest pain, shortness of breath. EXAM: PORTABLE CHEST 1 VIEW COMPARISON:  Prior radiograph from 06/14/2016 FINDINGS: Transverse heart size at the upper limits of normal. Mediastinal silhouette within normal limits. Aortic atherosclerosis. Lungs normally inflated. Mild diffuse pulmonary interstitial congestion without frank alveolar edema. Probable small right pleural effusion. No consolidative opacity. No pneumothorax. No acute osseous finding. IMPRESSION: 1. Mild diffuse pulmonary interstitial congestion without frank pulmonary edema. 2. Probable small right pleural effusion. 3. Aortic atherosclerosis. Electronically Signed   By: Jeannine Boga M.D.   On: 10/27/2018 02:10   Ir Ileana Ladd Art  Lee Guide Roadmapping  Result Date: 10/30/2018 INDICATION: Recent endoscopy with active extravasation from duodenal polyp with subsequent CTA demonstrating focal ectasia of the distal trifurcation of the GDA. Request made for mesenteric arteriogram and potential percutaneous coil embolization. EXAM: 1. ULTRASOUND GUIDANCE FOR ARTERIAL ACCESS 2. CELIAC ARTERIOGRAM (1st ORDER) 3. SELECTIVE GASTRO EPIPLOIC ARTERIOGRAM AND PERCUTANEOUS COIL EMBOLIZATION. 4. SELECTIVE ANTERIOR AND POSTERIOR DIVISIONS OF THE PANCREATICODUODENAL ARTERY AND PERCUTANEOUS COIL EMBOLIZATION. 5. SELECTIVE GASTRODUODENAL ARTERIOGRAM AND PERCUTANEOUS COIL EMBOLIZATION COMPARISON:  CTA abdomen and pelvis - 10/29/2018 MEDICATIONS: None ANESTHESIA/SEDATION: Moderate (conscious) sedation was employed during this procedure. A total of Versed 1 mg and Fentanyl 50 mcg was administered intravenously. Moderate Sedation  Time: 70 minutes. The patient's level of consciousness and vital signs were monitored continuously by radiology nursing throughout the procedure under my direct supervision. CONTRAST:  75 cc Isovue-300 FLUOROSCOPY TIME:  20 minutes, 36 seconds (0,762 mGy) COMPLICATIONS: None immediate. PROCEDURE: Informed consent was obtained from the patient's daughter following explanation of the procedure, risks, benefits and alternatives. The patient understands, agrees and consents for the procedure. All questions were addressed. A time out was performed prior to the initiation of the procedure. Maximal barrier sterile technique utilized including caps, mask, sterile gowns, sterile gloves, large sterile drape, hand hygiene, and Betadine prep. The right femoral head was marked fluoroscopically. Under sterile conditions and local anesthesia, the right common femoral artery access was performed with a micropuncture needle. Under direct ultrasound guidance, the right common femoral was accessed with a micropuncture kit. An ultrasound image was saved for documentation purposes. This allowed for placement of a 5-French vascular sheath. A limited arteriogram was performed through the side arm of the sheath confirming appropriate access within the right common femoral artery. Mickelson catheter was advanced to the level of the thoracic aorta where was reformed, back bled and flushed. Mickelson catheter was then utilized to select the celiac artery and a selective celiac arteriogram was performed. With the use of a fathom 14 microwire, a regular Renegade microcatheter was advanced into the gastroduodenal artery and a selective gastroduodenal arteriogram was performed. Next, the microcatheter was advanced into the posterior division of the pancreaticoduodenal artery. Selective arteriogram was performed and the vessel was percutaneously coil embolized to near its origin with multiple overlapping 4, 5 and 6 mm diameter interlock coils.  Next, the microcatheter was advanced into the gastroepiploic artery and a selective gastroepiploic arteriogram was performed. The vessel within percutaneously coil embolized to near its origin with multiple overlapping 6 mm diameter interlock coils. During deployment of the final coil, there was difficulty removing the pushing wire, ultimately resulting in damage to the microcatheter requiring replacement. With the use of a fathom 14 microwire, microcatheter was utilized to select the anterior division of the pancreaticoduodenal artery. Selective arteriogram was performed and the vessel was percutaneously coil embolized to near its origin with multiple overlapping 4 and 5 mm diameter interlock coils. Next, the microcatheter was retracted into the distal aspect of the GDA and a selective arteriogram was performed. The focally ectatic segment of the distal aspect of the GDA and it's distal aspect was then percutaneously coil embolized with multiple overlapping 8 mm and 6 mm diameter interlock coils to the level of its more proximal branch division. The microcatheter was retracted into the proximal GDA as well as the common hepatic artery, and completion arteriograms were performed Images were reviewed and the procedure was terminated. At this point, all wires, catheters and sheaths were removed  from the patient. Hemostasis was achieved at the right groin access site with deployment of an ExoSeal closure device and manual compression. The patient tolerated the procedure well without immediate post procedural complication. FINDINGS: Selective celiac arteriogram demonstrates conventional takeoff of the GDA. Redemonstrated focally ectatic segment of the distal aspect of the GDA at the level of its trifurcation as was seen on preceding CTA. Note is again made of additional branch of the GDA at its mid aspect however this vessel does not contribute to the ectatic portion of the more distal aspect of the GDA. Following  selective arteriogram and percutaneous coil embolization of the gastroepiploic, the anterior and posterior divisions of the pancreaticoduodenal artery and the distal aspect of the GDA, there is complete occlusion of the ectatic segment of the GDA without persistent flow. The more proximal branch of the GDA, not contributing to the distal ectatic segment, remains patent. IMPRESSION: Technically successful percutaneous coil embolization of ectatic segment of the distal GDA at the level of its trifurcation. PLAN: - Continued conservative management with daily CBCs as per the primary team. - Repeat endoscopy may be performed at the discretion GI service as clinically indicated. Electronically Signed   By: Sandi Mariscal M.D.   On: 10/30/2018 11:54   Ct Angio Abd/pel W/ And/or W/o  Addendum Date: 10/29/2018   ADDENDUM REPORT: 10/29/2018 09:42 ADDENDUM: There are several small nodular densities in the right lower lobe. Largest nodular area measures 7 x 4 mm on sequence 14, image 14. Many of these nodular areas are irregular. Findings are suggestive for an infectious or inflammatory process but nonspecific. These nodular areas are new from a chest CT on 06/03/2004. Non-contrast chest CT at 3-6 months is recommended. If the nodules are stable at time of repeat CT, then future CT at 18-24 months (from today's scan) is considered optional for low-risk patients, but is recommended for high-risk patients. This recommendation follows the consensus statement: Guidelines for Management of Incidental Pulmonary Nodules Detected on CT Images: From the Fleischner Society 2017; Radiology 2017; 284:228-243. These results will be called to the ordering clinician or representative by the Radiologist Assistant, and communication documented in the PACS or zVision Dashboard. Electronically Signed   By: Markus Daft M.D.   On: 10/29/2018 09:42   Result Date: 10/29/2018 CLINICAL DATA:  73 year old with GI bleeding. Recent upper endoscopy  demonstrates a 7 mm polypoid lesion with bleeding at the duodenal bulb. EXAM: CT ANGIOGRAPHY ABDOMEN AND PELVIS WITH CONTRAST AND WITHOUT CONTRAST TECHNIQUE: Multidetector CT imaging of the abdomen and pelvis was performed using the standard protocol during bolus administration of intravenous contrast. Multiplanar reconstructed images and MIPs were obtained and reviewed to evaluate the vascular anatomy. CONTRAST:  136m OMNIPAQUE IOHEXOL 350 MG/ML SOLN COMPARISON:  None. FINDINGS: VASCULAR Aorta: Calcified atherosclerotic disease in the abdominal aorta without aneurysm or dissection. Celiac: Celiac trunk is widely patent. Main branch vessels of the celiac trunk are the left gastric artery, splenic artery and common hepatic artery. Focal calcifications in the distal splenic artery may represent small aneurysms, largest measuring 0.6 cm. There is focal ectasia of the distal gastroduodenal artery on sequence 5, image 71. This area of ectasia measures 1.1 cm in length and the diameter of the vessel measures up to 0.6 cm. The GDA vessel measures roughly 0.4 cm. This area of ectasia is at a branch site. There is no active contrast extravasation around this area of vessel enlargement. Findings could represent a small pseudoaneurysm. There is no evidence for  contrast extravasation within duodenum. This focal ectasia and possible pseudoaneurysm involving the distal GDA is along the posterior caudal aspect of the duodenal bulb region. No evidence for an enhancing intraluminal polypoid lesion. SMA: Evidence for noncalcified plaque at the origin of SMA causing at least mild stenosis at the origin. Main branch vessels of the SMA are patent. Renals: Both renal arteries are patent without evidence of aneurysm, dissection, vasculitis, fibromuscular dysplasia or significant stenosis. IMA: IMA is patent. Inflow: Atherosclerotic plaque at the origin of the right common iliac artery without significant stenosis. Right iliac arteries  are patent. Atherosclerotic disease in the right internal iliac artery. Mild plaque in left iliac arteries without significant stenosis. Proximal Outflow: Proximal femoral arteries are patent bilaterally without significant stenosis. Veins: Portal venous system is patent. IVC and renal veins are patent. No gross abnormality to the proximal femoral or iliac veins. Review of the MIP images confirms the above findings. NON-VASCULAR Lower chest: Chest and septal thickening in the lower lungs with small bilateral pleural effusions. Hepatobiliary: Normal appearance of the liver and gallbladder. No biliary dilatation. Pancreas: Unremarkable. No pancreatic ductal dilatation or surrounding inflammatory changes. Spleen: Normal in size without focal abnormality. Adrenals/Urinary Tract: Normal appearance of the adrenal glands. Small hypodensity in the right kidney lower pole is too small to definitively characterize. No hydronephrosis. No suspicious renal lesions. Urinary bladder is unremarkable. Stomach/Bowel: Prominent diverticulosis in the sigmoid colon without acute colonic inflammation. Evidence for normal appendix. Normal appearance of the stomach. Normal appearance of the duodenum. No extrinsic compression or lesion in the region of the duodenal bulb or descending duodenum. Minimal fat stranding between the pancreatic head and duodenum is nonspecific. Again, no active contrast extravasation in the duodenal bulb region. Negative for small bowel dilatation. Lymphatic: No abdominopelvic lymphadenopathy. Reproductive: Uterus and bilateral adnexa are unremarkable. Incidentally, there are calcifications associated with the right ovary or right adnexa. Other: Negative free air.  Negative for free fluid. Musculoskeletal: Mild dextroscoliosis in the lumbar spine. Multilevel degenerative disc space narrowing with vacuum disc phenomena in the lumbar spine. Vertebral body heights are maintained. IMPRESSION: VASCULAR 1. Focal ectasia  and irregularity of the distal gastroduodenal artery at a branch site. Findings could represent a small pseudoaneurysm. This area of vessel enlargement is along the posterior and caudal aspect of the duodenal bulb. There is no active contrast extravasation associated with this small pseudoaneurysm or ectasia and no evidence for contrast within the duodenum. 2.  Aortic Atherosclerosis (ICD10-I70.0). 3. 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 4. Two small calcified areas involving distal splenic artery which could represent small aneurysms, largest measuring 0.6 cm. NON-VASCULAR 1. No acute abnormality in the abdomen or pelvis. 2. Small bilateral pleural effusions. Electronically Signed: By: Markus Daft M.D. On: 10/29/2018 09:19     Lurline Del, MD 11/07/2018, 5:58 AM PGY-1, [place] [facility] pager: [phone], text pages welcome

## 2018-11-07 NOTE — Progress Notes (Signed)
Subjective:  Denies SSCP, palpitations or Dyspnea   Objective:  Vitals:   11/07/18 0050 11/07/18 0500 11/07/18 0620 11/07/18 0717  BP:   126/72 (!) 128/56  Pulse:   80 75  Resp: 18  18 18   Temp:   98.2 F (36.8 C) 98.5 F (36.9 C)  TempSrc:   Oral Oral  SpO2: 95%  96% 95%  Weight:  75.6 kg    Height:        Intake/Output from previous day:  Intake/Output Summary (Last 24 hours) at 11/07/2018 0813 Last data filed at 11/07/2018 0758 Gross per 24 hour  Intake 1440 ml  Output 300 ml  Net 1140 ml    Physical Exam: Affect appropriate Pale overweight white female  HEENT: normal Neck supple with no adenopathy JVP normal no bruits no thyromegaly Lungs clear with no wheezing and good diaphragmatic motion Heart:  S1/S2 no murmur, no rub, gallop or click PMI normal Abdomen: benighn, BS positve, no tenderness, no AAA no bruit.  No HSM or HJR Distal pulses intact with no bruits No edema Neuro non-focal Skin warm and dry No muscular weakness   Lab Results: Basic Metabolic Panel: Recent Labs    11/06/18 0157 11/06/18 0433 11/07/18 0446  NA 137  --  140  K 4.8  --  4.0  CL 103  --  105  CO2 19*  --  25  GLUCOSE 257*  --  119*  BUN 28*  --  42*  CREATININE 1.50*  --  1.56*  CALCIUM 8.3*  --  8.5*  MG  --  1.7  --    Liver Function Tests: Recent Labs    11/07/18 0446  AST 18  ALT 11  ALKPHOS 80  BILITOT 0.4  PROT 6.1*  ALBUMIN 3.1*   No results for input(s): LIPASE, AMYLASE in the last 72 hours. CBC: Recent Labs    11/06/18 0157 11/07/18 0446  WBC 13.0* 8.1  NEUTROABS 8.0* 4.6  HGB 9.9* 8.4*  HCT 34.7* 28.0*  MCV 93.3 89.5  PLT 449* 274   Cardiac Enzymes: No results for input(s): CKTOTAL, CKMB, CKMBINDEX, TROPONINI in the last 72 hours. BNP: Invalid input(s): POCBNP D-Dimer: Recent Labs    11/06/18 1510  DDIMER 3.41*   Hemoglobin A1C: Recent Labs    11/06/18 0419  HGBA1C 5.3   Fasting Lipid Panel: No results for input(s): CHOL,  HDL, LDLCALC, TRIG, CHOLHDL, LDLDIRECT in the last 72 hours. Thyroid Function Tests: Recent Labs    11/06/18 0433  TSH 5.534*   Anemia Panel: No results for input(s): VITAMINB12, FOLATE, FERRITIN, TIBC, IRON, RETICCTPCT in the last 72 hours.  Imaging: Ct Angio Chest Pe W Or Wo Contrast  Result Date: 11/06/2018 CLINICAL DATA:  Dyspnea and syncopal episode. Elevated D-dimer. History of DVT. EXAM: CT ANGIOGRAPHY CHEST WITH CONTRAST TECHNIQUE: Multidetector CT imaging of the chest was performed using the standard protocol during bolus administration of intravenous contrast. Multiplanar CT image reconstructions and MIPs were obtained to evaluate the vascular anatomy. CONTRAST:  33mL OMNIPAQUE IOHEXOL 350 MG/ML SOLN. Reduced dose due to GFR 34. COMPARISON:  Chest CT 06/03/2004 and abdominal CT 10/29/2018 FINDINGS: Cardiovascular: Heart is normal size. There is mild calcified plaque over the left anterior descending coronary artery. Minimal calcified plaque over the thoracic aorta. Thoracic aorta is normal caliber. Pulmonary arterial system is well opacified without evidence of emboli. Mediastinum/Nodes: No mediastinal or hilar adenopathy. Remaining mediastinal structures are within normal. Lungs/Pleura: Lungs are adequately inflated with  minimal bibasilar atelectatic change. Subtle hazy attenuation adjacent the central vasculature likely mild interstitial edema. Small amount right pleural fluid is present. Airways are normal. Upper Abdomen: No acute findings. Minimal calcified plaque over the abdominal aorta. Musculoskeletal: Mild degenerative change of the spine. Review of the MIP images confirms the above findings. IMPRESSION: No evidence of pulmonary embolism. Evidence of mild interstitial edema. Small right pleural effusion. Minimal bibasilar atelectasis. Aortic Atherosclerosis (ICD10-I70.0). Atherosclerotic coronary artery disease. Electronically Signed   By: Marin Olp M.D.   On: 11/06/2018 18:28    Dg Chest Port 1 View  Result Date: 11/06/2018 CLINICAL DATA:  Shortness of breath. EXAM: PORTABLE CHEST 1 VIEW COMPARISON:  Radiographs 10/30/2018 FINDINGS: The heart is upper normal in size. Unchanged mediastinal contours with aortic atherosclerosis. Slight increase in pulmonary edema from prior exam with Kerley B-lines. Small pleural effusions. No pneumothorax or confluent airspace disease. IMPRESSION: Slight increase in pulmonary edema from prior exam. Small pleural effusions. Aortic Atherosclerosis (ICD10-I70.0). Electronically Signed   By: Keith Rake M.D.   On: 11/06/2018 02:04    Cardiac Studies:  ECG: NSR no acute changes    Telemetry:  NSR 11/07/2018   Echo:   EF 60-65% no RWMA;s   Medications:    atorvastatin  40 mg Oral Daily   carvedilol  12.5 mg Oral BID WC   feeding supplement (ENSURE ENLIVE)  237 mL Oral BID BM   furosemide  40 mg Intravenous Once   insulin aspart  0-9 Units Subcutaneous TID WC   pantoprazole  40 mg Oral Daily   sertraline  50 mg Oral Daily      Assessment/Plan:   Dyspnea:  ? Diastolic CHF resolved azotemic would not d/c on diuretic significant anemia contributes CTA negative for PE  Syncope:  No etiology may be related to heat and anemia. Concern for amnestic quality of episode consider neuro w/u  HTN:  Well controlled.  Continue current medications and low sodium Dash type diet.    Anemia:  F/u primary will make more heat sensitive and contribute to dyspnea   Will arrange outpatient f/u with me and consider stress testing but with r/o no ECG changes and no chest pain do not feel needs to be done as inpatient  Jenkins Rouge 11/07/2018, 8:13 AM

## 2018-11-07 NOTE — Progress Notes (Signed)
Clarification to justify level of care of patient being kept overnight. Hemoglobin dropped 1.5 points since yesterday. Keeping patient overnight to ensure drop in hemoglobin is not ongoing and doesn't require transfusion.  Lurline Del, DO

## 2018-11-07 NOTE — Progress Notes (Signed)
Daughter [name] updated and is very concerned about mothers condition and plan for when she returns home. Asked MD to call for progonsis  ASAP before discharge.

## 2018-11-08 LAB — CBC
HCT: 26.5 % — ABNORMAL LOW (ref 36.0–46.0)
Hemoglobin: 8.1 g/dL — ABNORMAL LOW (ref 12.0–15.0)
MCH: 27 pg (ref 26.0–34.0)
MCHC: 30.6 g/dL (ref 30.0–36.0)
MCV: 88.3 fL (ref 80.0–100.0)
Platelets: 336 10*3/uL (ref 150–400)
RBC: 3 MIL/uL — ABNORMAL LOW (ref 3.87–5.11)
RDW: 16 % — ABNORMAL HIGH (ref 11.5–15.5)
WBC: 8 10*3/uL (ref 4.0–10.5)
nRBC: 0 % (ref 0.0–0.2)

## 2018-11-08 LAB — GLUCOSE, CAPILLARY
Glucose-Capillary: 119 mg/dL — ABNORMAL HIGH (ref 70–99)
Glucose-Capillary: 189 mg/dL — ABNORMAL HIGH (ref 70–99)

## 2018-11-08 MED ORDER — ENSURE ENLIVE PO LIQD: 237.0000 mL | Freq: Two times a day (BID) | ORAL | 12 refills | Status: DC

## 2018-11-08 NOTE — Discharge Instructions (Signed)
Discharge summary:  - Make sure you are taking your Iron supplement daily with breakfast. - Please follow up with your PCP within one week for recheck, including new medications such as your blood pressure medicine - Please return if you experience similar symptoms, high fevers, confusion, chest pain that does not subside within 15 minutes or shortness of breath.

## 2018-11-10 ENCOUNTER — Inpatient Hospital Stay (HOSPITAL_COMMUNITY)
Admission: EM | Admit: 2018-11-10 | Discharge: 2018-11-12 | DRG: 291 | Disposition: A | Discharge status: Home / Self Care | Payer: Medicare Other | Attending: Family Medicine | Admitting: Family Medicine

## 2018-11-10 ENCOUNTER — Emergency Department (HOSPITAL_COMMUNITY): Payer: Medicare Other

## 2018-11-10 ENCOUNTER — Encounter (HOSPITAL_COMMUNITY): Payer: Self-pay | Admitting: *Deleted

## 2018-11-10 DIAGNOSIS — Z8249 Family history of ischemic heart disease and other diseases of the circulatory system: Secondary | ICD-10-CM | POA: Diagnosis not present

## 2018-11-10 DIAGNOSIS — R0602 Shortness of breath: Secondary | ICD-10-CM

## 2018-11-10 DIAGNOSIS — E785 Hyperlipidemia, unspecified: Secondary | ICD-10-CM | POA: Diagnosis present

## 2018-11-10 DIAGNOSIS — K219 Gastro-esophageal reflux disease without esophagitis: Secondary | ICD-10-CM | POA: Diagnosis present

## 2018-11-10 DIAGNOSIS — I447 Left bundle-branch block, unspecified: Secondary | ICD-10-CM | POA: Diagnosis present

## 2018-11-10 DIAGNOSIS — E7800 Pure hypercholesterolemia, unspecified: Secondary | ICD-10-CM | POA: Diagnosis not present

## 2018-11-10 DIAGNOSIS — Z803 Family history of malignant neoplasm of breast: Secondary | ICD-10-CM

## 2018-11-10 DIAGNOSIS — M109 Gout, unspecified: Secondary | ICD-10-CM | POA: Diagnosis present

## 2018-11-10 DIAGNOSIS — R0902 Hypoxemia: Secondary | ICD-10-CM | POA: Diagnosis not present

## 2018-11-10 DIAGNOSIS — N183 Chronic kidney disease, stage 3 (moderate): Secondary | ICD-10-CM | POA: Diagnosis present

## 2018-11-10 DIAGNOSIS — E43 Unspecified severe protein-calorie malnutrition: Secondary | ICD-10-CM | POA: Diagnosis present

## 2018-11-10 DIAGNOSIS — N179 Acute kidney failure, unspecified: Secondary | ICD-10-CM | POA: Diagnosis present

## 2018-11-10 DIAGNOSIS — Z20828 Contact with and (suspected) exposure to other viral communicable diseases: Secondary | ICD-10-CM | POA: Diagnosis present

## 2018-11-10 DIAGNOSIS — R000 Tachycardia, unspecified: Secondary | ICD-10-CM | POA: Diagnosis not present

## 2018-11-10 DIAGNOSIS — R61 Generalized hyperhidrosis: Secondary | ICD-10-CM | POA: Diagnosis not present

## 2018-11-10 DIAGNOSIS — I130 Hypertensive heart and chronic kidney disease with heart failure and stage 1 through stage 4 chronic kidney disease, or unspecified chronic kidney disease: Secondary | ICD-10-CM | POA: Diagnosis not present

## 2018-11-10 DIAGNOSIS — J810 Acute pulmonary edema: Secondary | ICD-10-CM

## 2018-11-10 DIAGNOSIS — M1990 Unspecified osteoarthritis, unspecified site: Secondary | ICD-10-CM | POA: Diagnosis present

## 2018-11-10 DIAGNOSIS — F0390 Unspecified dementia without behavioral disturbance: Secondary | ICD-10-CM | POA: Diagnosis present

## 2018-11-10 DIAGNOSIS — I10 Essential (primary) hypertension: Secondary | ICD-10-CM | POA: Diagnosis not present

## 2018-11-10 DIAGNOSIS — Z87891 Personal history of nicotine dependence: Secondary | ICD-10-CM | POA: Diagnosis not present

## 2018-11-10 DIAGNOSIS — D631 Anemia in chronic kidney disease: Secondary | ICD-10-CM | POA: Diagnosis present

## 2018-11-10 DIAGNOSIS — R739 Hyperglycemia, unspecified: Secondary | ICD-10-CM | POA: Diagnosis present

## 2018-11-10 DIAGNOSIS — F411 Generalized anxiety disorder: Secondary | ICD-10-CM | POA: Diagnosis present

## 2018-11-10 DIAGNOSIS — Z6830 Body mass index (BMI) 30.0-30.9, adult: Secondary | ICD-10-CM

## 2018-11-10 DIAGNOSIS — J811 Chronic pulmonary edema: Secondary | ICD-10-CM | POA: Diagnosis not present

## 2018-11-10 DIAGNOSIS — I5033 Acute on chronic diastolic (congestive) heart failure: Secondary | ICD-10-CM | POA: Diagnosis not present

## 2018-11-10 DIAGNOSIS — E669 Obesity, unspecified: Secondary | ICD-10-CM | POA: Diagnosis present

## 2018-11-10 LAB — COMPREHENSIVE METABOLIC PANEL
ALT: 13 U/L (ref 0–44)
AST: 27 U/L (ref 15–41)
Albumin: 3.4 g/dL — ABNORMAL LOW (ref 3.5–5.0)
Alkaline Phosphatase: 113 U/L (ref 38–126)
Anion gap: 16 — ABNORMAL HIGH (ref 5–15)
BUN: 23 mg/dL (ref 8–23)
CO2: 17 mmol/L — ABNORMAL LOW (ref 22–32)
Calcium: 8.5 mg/dL — ABNORMAL LOW (ref 8.9–10.3)
Chloride: 102 mmol/L (ref 98–111)
Creatinine, Ser: 1.34 mg/dL — ABNORMAL HIGH (ref 0.44–1.00)
GFR calc Af Amer: 45 mL/min — ABNORMAL LOW (ref >60)
GFR calc non Af Amer: 39 mL/min — ABNORMAL LOW (ref >60)
Glucose, Bld: 292 mg/dL — ABNORMAL HIGH (ref 70–99)
Potassium: 3.8 mmol/L (ref 3.5–5.1)
Sodium: 135 mmol/L (ref 135–145)
Total Bilirubin: 0.3 mg/dL (ref 0.3–1.2)
Total Protein: 7.1 g/dL (ref 6.5–8.1)

## 2018-11-10 LAB — CBC WITH DIFFERENTIAL/PLATELET
Abs Immature Granulocytes: 0.04 10*3/uL (ref 0.00–0.07)
Basophils Absolute: 0.1 10*3/uL (ref 0.0–0.1)
Basophils Relative: 1 %
Eosinophils Absolute: 0 10*3/uL (ref 0.0–0.5)
Eosinophils Relative: 0 %
HCT: 37.6 % (ref 36.0–46.0)
Hemoglobin: 10.4 g/dL — ABNORMAL LOW (ref 12.0–15.0)
Immature Granulocytes: 0 %
Lymphocytes Relative: 45 %
Lymphs Abs: 7 10*3/uL — ABNORMAL HIGH (ref 0.7–4.0)
MCH: 26.1 pg (ref 26.0–34.0)
MCHC: 27.7 g/dL — ABNORMAL LOW (ref 30.0–36.0)
MCV: 94.5 fL (ref 80.0–100.0)
Monocytes Absolute: 1.1 10*3/uL — ABNORMAL HIGH (ref 0.1–1.0)
Monocytes Relative: 7 %
Neutro Abs: 7.4 10*3/uL (ref 1.7–7.7)
Neutrophils Relative %: 47 %
Platelets: 562 10*3/uL — ABNORMAL HIGH (ref 150–400)
RBC: 3.98 MIL/uL (ref 3.87–5.11)
RDW: 15.9 % — ABNORMAL HIGH (ref 11.5–15.5)
WBC: 15.6 10*3/uL — ABNORMAL HIGH (ref 4.0–10.5)
nRBC: 0 % (ref 0.0–0.2)

## 2018-11-10 LAB — POCT I-STAT EG7
Acid-base deficit: 9 mmol/L — ABNORMAL HIGH (ref 0.0–2.0)
Bicarbonate: 18.9 mmol/L — ABNORMAL LOW (ref 20.0–28.0)
Calcium, Ion: 1.12 mmol/L — ABNORMAL LOW (ref 1.15–1.40)
HCT: 37 % (ref 36.0–46.0)
Hemoglobin: 12.6 g/dL (ref 12.0–15.0)
O2 Saturation: 95 %
Potassium: 3.8 mmol/L (ref 3.5–5.1)
Sodium: 138 mmol/L (ref 135–145)
TCO2: 20 mmol/L — ABNORMAL LOW (ref 22–32)
pCO2, Ven: 47.3 mmHg (ref 44.0–60.0)
pH, Ven: 7.209 — ABNORMAL LOW (ref 7.250–7.430)
pO2, Ven: 91 mmHg — ABNORMAL HIGH (ref 32.0–45.0)

## 2018-11-10 LAB — SARS CORONAVIRUS 2 BY RT PCR (HOSPITAL ORDER, PERFORMED IN ~~LOC~~ HOSPITAL LAB): SARS Coronavirus 2: NEGATIVE

## 2018-11-10 LAB — BRAIN NATRIURETIC PEPTIDE: B Natriuretic Peptide: 427.4 pg/mL — ABNORMAL HIGH (ref 0.0–100.0)

## 2018-11-10 LAB — TROPONIN I (HIGH SENSITIVITY): Troponin I (High Sensitivity): 17 ng/L (ref <18)

## 2018-11-10 MED ORDER — NITROGLYCERIN IN D5W 200-5 MCG/ML-% IV SOLN
0.0000 ug/min | INTRAVENOUS | Status: DC
  Administered 2018-11-10: 50 ug/min via INTRAVENOUS
  Filled 2018-11-10: qty 250

## 2018-11-10 MED ORDER — FUROSEMIDE 10 MG/ML IJ SOLN
40.0000 mg | Freq: Once | INTRAMUSCULAR | Status: AC
  Administered 2018-11-10: 40 mg via INTRAVENOUS
  Filled 2018-11-10: qty 4

## 2018-11-10 MED ORDER — LORAZEPAM 2 MG/ML IJ SOLN
INTRAMUSCULAR | Status: AC
  Administered 2018-11-10: 0.5 mg
  Filled 2018-11-10: qty 1

## 2018-11-10 NOTE — ED Notes (Signed)
Hannah [name], [phone] Colen Darling power of attorney and daughter requesting to be updated

## 2018-11-10 NOTE — ED Provider Notes (Signed)
[facility] EMERGENCY DEPARTMENT Provider Note   CSN: [id number] Arrival date & time: 11/10/18  2042     History   Chief Complaint Chief Complaint  Patient presents with  . Shortness of Breath    HPI Hannah [name] is a 73 y.o. female.     HPI Patient is a 73 year old female with a past medical history of anemia, anxiety, GERD, HTN, recent GI bleed and several recent hospitalizations for pulmonary edema with dyspnea who presents to the emergency department today by EMS from home with acute onset shortness of breath.  EMS reports when they arrived to the patient's home she was tachypneic with around 40 breaths/min and was found to have room air oxygen saturation of 74%.  She was placed on CPAP but did not tolerate this and it was removed after only about 5 minutes. She was then placed on a nonrebreather at 15 L/min.  She also received 0.6 mg of epi by EMS.   At this ED, the patient was able to answer some questions to provide further history, but did seem slightly disoriented, and is found to have a documented history of apparent dementia (according to records from her recent hospitalization).  Patient states that she became acutely short of breath earlier today, she denies any chest pain or palpitations.  She denies fever or chills, nausea or vomiting or diarrhea.  Past Medical History:  Diagnosis Date  . Anemia   . Anxiety   . Arthritis    "hands" (05/09/2015)  . GERD (gastroesophageal reflux disease)   . High cholesterol   . History of blood transfusion 1960s; 05/08/2015   "when I had my kids; low HgB"  . Hypertension   . Osteoarthritis   . Pneumonia 05/2004    Patient Active Problem List   Diagnosis Date Noted  . CHF (congestive heart failure) ([place]) 11/06/2018  . Acute pulmonary edema (HCC)   . Gastrointestinal hemorrhage, unspecified   . Gastric bleed   . Symptomatic anemia 10/27/2018  . Right foot pain 06/22/2018  . Inability to bear weight  06/22/2018  . Left foot pain 05/31/2018  . Elevated TSH 02/03/2018  . Cerebral microvascular disease 02/02/2018  . Renal insufficiency 11/09/2017  . Gout 09/11/2015  . OBESITY 03/26/2010  . Anxiety state 08/09/2008  . Essential hypertension 08/09/2008  . GERD 08/09/2008    Past Surgical History:  Procedure Laterality Date  . BIOPSY  10/28/2018   Procedure: BIOPSY;  Surgeon: Juanita Craver, MD;  Location[facility] ENDOSCOPY;  Service: Endoscopy;;  . CARDIAC CATHETERIZATION  05/2004  . COLONOSCOPY  07/2002   Archie Endo 09/02/2010  . COLONOSCOPY WITH PROPOFOL N/A 10/28/2018   Procedure: COLONOSCOPY WITH PROPOFOL;  Surgeon: Juanita Craver, MD;  Location: [facility] ENDOSCOPY;  Service: Endoscopy;  Laterality: N/A;  . ESOPHAGOGASTRODUODENOSCOPY  07/2002   w/biopsies/notes 09/02/2010  . ESOPHAGOGASTRODUODENOSCOPY (EGD) WITH PROPOFOL N/A 10/28/2018   Procedure: ESOPHAGOGASTRODUODENOSCOPY (EGD) WITH PROPOFOL;  Surgeon: Juanita Craver, MD;  Location[facility] ENDOSCOPY;  Service: Endoscopy;  Laterality: N/A;  . HOT HEMOSTASIS N/A 10/28/2018   Procedure: HOT HEMOSTASIS (ARGON PLASMA COAGULATION/BICAP);  Surgeon: Juanita Craver, MD;  Location: [facility] ENDOSCOPY;  Service: Endoscopy;  Laterality: N/A;  . IR ANGIOGRAM SELECTIVE EACH ADDITIONAL VESSEL  10/30/2018  . IR ANGIOGRAM SELECTIVE EACH ADDITIONAL VESSEL  10/30/2018  . IR ANGIOGRAM SELECTIVE EACH ADDITIONAL VESSEL  10/30/2018  . IR ANGIOGRAM SELECTIVE EACH ADDITIONAL VESSEL  10/30/2018  . IR ANGIOGRAM VISCERAL SELECTIVE  10/30/2018  . IR EMBO ART  VEN  HEMORR LYMPH EXTRAV  INC GUIDE ROADMAPPING  10/30/2018  . IR US GUIDANCE  10/30/2018  . POLYPECTOMY  10/28/2018   Procedure: POLYPECTOMY;  Surgeon: Juanita Craver, MD;  Location: [facility] ENDOSCOPY;  Service: Endoscopy;;     OB History   No obstetric history on file.      Home Medications    Prior to Admission medications   Medication Sig Start Date End Date Taking? Authorizing Provider  acetaminophen (TYLENOL) 325 MG tablet Take 650 mg by  mouth every 6 (six) hours as needed for mild pain or headache.    [provider]  ALPRAZolam (XANAX) 0.25 MG tablet TAKE 1 TABLET (0.25 MG TOTAL) BY MOUTH AT BEDTIME AS NEEDED. Patient taking differently: Take 0.25 mg by mouth at bedtime as needed for anxiety or sleep.  10/14/18   [name], MD  atorvastatin (LIPITOR) 40 MG tablet Take 1 tablet (40 mg total) by mouth daily. 05/31/18   [name], MD  carvedilol (COREG) 12.5 MG tablet Take 1 tablet (12.5 mg total) by mouth 2 (two) times daily with a meal. 11/09/17   Chambliss, Jeb Levering, MD  feeding supplement, ENSURE ENLIVE, (ENSURE ENLIVE) LIQD Take 237 mLs by mouth 2 (two) times daily between meals. 11/08/18   Lurline Del, MD  ferrous sulfate 325 (65 FE) MG tablet Take 325 mg by mouth daily with breakfast.    [provider]  omeprazole (PRILOSEC) 20 MG capsule TAKE 1 CAPSULE BY MOUTH DAILY AS NEEDED Patient taking differently: Take 20 mg by mouth as needed.  08/09/18   [name], MD  sertraline (ZOLOFT) 50 MG tablet TAKE 1/2 TABLET BY MOUTH DAILY Patient taking differently: Take 50 mg by mouth daily.  09/13/18   [name], MD    Family History Family History  Problem Relation Age of Onset  . Heart disease Mother        died at age 41  . Breast cancer Sister   . Cancer Sister        Breast  . Hypertension Father   . Hypertension Sister     Social History Social History   Tobacco Use  . Smoking status: Former Smoker    Packs/day: 0.30    Years: 8.00    Pack years: 2.40    Types: Cigarettes    Quit date: 05/30/2004    Years since quitting: 14.4  . Smokeless tobacco: Never Used  Substance Use Topics  . Alcohol use: No    Alcohol/week: 0.0 standard drinks  . Drug use: Never     Allergies   Patient has no known allergies.   Review of Systems Review of Systems  Constitutional: Positive for fatigue. Negative for chills.  HENT: Negative for congestion, sore  throat and voice change.   Respiratory: Positive for cough and shortness of breath. Negative for chest tightness.   Cardiovascular: Negative for chest pain, palpitations and leg swelling.  Gastrointestinal: Negative for abdominal distention, abdominal pain, diarrhea, nausea and vomiting.  Musculoskeletal: Negative for back pain, myalgias, neck pain and neck stiffness.  Skin: Negative for color change and rash.  Neurological: Positive for weakness. Negative for dizziness, syncope and light-headedness.  Psychiatric/Behavioral: Negative.      Physical Exam ED Triage Vitals  Enc Vitals Group     BP 11/10/18 2100 (!) 145/74     Pulse Rate 11/10/18 2100 (!) 128     Resp 11/10/18 2100 (!) 25     Temp 11/10/18 2059 98.2 F (  36.8 C)     Temp Source 11/10/18 2059 Oral     SpO2 11/10/18 2100 95 %    Updated Vital Signs BP 98/66   Pulse (!) 103   Temp 98.2 F (36.8 C) (Oral)   Resp 19   SpO2 99%   Physical Exam Vitals signs and nursing note reviewed.  Constitutional:      General: She is in acute distress.     Appearance: She is obese. She is ill-appearing.  HENT:     Head: Normocephalic and atraumatic.     Mouth/Throat:     Mouth: Mucous membranes are moist.  Eyes:     Extraocular Movements: Extraocular movements intact.     Pupils: Pupils are equal, round, and reactive to light.  Neck:     Musculoskeletal: Neck supple.     Vascular: JVD present.  Cardiovascular:     Rate and Rhythm: Regular rhythm. Tachycardia present.     Pulses: Normal pulses.  Pulmonary:     Effort: Tachypnea, accessory muscle usage and respiratory distress present.     Breath sounds: Decreased breath sounds present.     Comments: Bilateral breath sounds are diminished throughout, there is rales appreciated in the bilateral bases and mid lungs, right greater than left. Abdominal:     General: There is no distension.     Tenderness: There is no abdominal tenderness. There is no guarding.   Musculoskeletal: Normal range of motion.  Skin:    General: Skin is warm and dry.  Neurological:     General: No focal deficit present.     Mental Status: She is alert.      ED Treatments / Results  Labs (all labs ordered are listed, but only abnormal results are displayed) Labs Reviewed  BRAIN NATRIURETIC PEPTIDE - Abnormal; Notable for the following components:      Result Value   B Natriuretic Peptide 427.4 (*)    All other components within normal limits  CBC WITH DIFFERENTIAL/PLATELET - Abnormal; Notable for the following components:   WBC 15.6 (*)    Hemoglobin 10.4 (*)    MCHC 27.7 (*)    RDW 15.9 (*)    Platelets 562 (*)    Lymphs Abs 7.0 (*)    Monocytes Absolute 1.1 (*)    All other components within normal limits  COMPREHENSIVE METABOLIC PANEL - Abnormal; Notable for the following components:   CO2 17 (*)    Glucose, Bld 292 (*)    Creatinine, Ser 1.34 (*)    Calcium 8.5 (*)    Albumin 3.4 (*)    GFR calc non Af Amer 39 (*)    GFR calc Af Amer 45 (*)    Anion gap 16 (*)    All other components within normal limits  POCT I-STAT EG7 - Abnormal; Notable for the following components:   pH, Ven 7.209 (*)    pO2, Ven 91.0 (*)    Bicarbonate 18.9 (*)    TCO2 20 (*)    Acid-base deficit 9.0 (*)    Calcium, Ion 1.12 (*)    All other components within normal limits  SARS CORONAVIRUS 2 (HOSPITAL ORDER, [place] [facility])  URINALYSIS, ROUTINE W REFLEX MICROSCOPIC  TROPONIN I (HIGH SENSITIVITY)  TROPONIN I (HIGH SENSITIVITY)    EKG EKG Interpretation  Date/Time:  Thursday November 10 2018 20:53:57 EDT Ventricular Rate:  125 PR Interval:    QRS Duration: 120 QT Interval:  326 QTC Calculation: 471 R  Axis:   74 Text Interpretation:  Sinus or ectopic atrial tachycardia Nonspecific intraventricular conduction delay Anterior infarct, acute (LAD) no stemi Confirmed by Antony Blackbird [phone]) on 11/10/2018 9:13:17 PM   Radiology Dg Chest 1 View   Result Date: 11/10/2018 CLINICAL DATA:  Acute shortness of breath today. Decreased oxygen saturation. EXAM: CHEST  1 VIEW COMPARISON:  Radiographs and CT 11/06/2018 FINDINGS: Progressive pulmonary edema, now moderate in degree. Bilateral pleural effusions are similar. Minimal fluid in the right minor fissure. Slight increased heart size since prior exam. Aortic arch atherosclerosis. No pneumothorax. IMPRESSION: Progressive pulmonary edema, now moderate in degree. Small bilateral pleural effusions. Electronically Signed   By: Keith Rake M.D.   On: 11/10/2018 21:25    Procedures Procedures (including critical care time)  Medications Ordered in ED Medications  nitroGLYCERIN 50 mg in dextrose 5 % 250 mL (0.2 mg/mL) infusion (50 mcg/min Intravenous New Bag/Given 11/10/18 2112)  LORazepam (ATIVAN) 2 MG/ML injection (0.5 mg  Given 11/10/18 2055)  furosemide (LASIX) injection 40 mg (40 mg Intravenous Given 11/10/18 2106)     Initial Impression / Assessment and Plan / ED Course  I have reviewed the triage vital signs and the nursing notes.  Pertinent labs & imaging results that were available during my care of the patient were reviewed by me and considered in my medical decision making (see chart for details).  Of note, this patient was evaluated in the Emergency Department for the symptoms described in the history of present illness. She was evaluated in the context of the global COVID-19 pandemic, which necessitated consideration that the patient might be at risk for infection with the SARS-CoV-2 virus that causes COVID-19. Institutional protocols and algorithms that pertain to the evaluation of patients at risk for COVID-19 are in a state of rapid change based on information released by regulatory bodies including the CDC and federal and state organizations. These policies and algorithms were followed during the patient's care in the ED.  During this patient encounter, the patient was wearing a  mask, and throughout this encounter I was wearing at least a surgical mask.  I was not within 6 feet of this patient for more than 15 minutes without eye protection when they were not wearing mask.   Differentials considered: Acute on chronic heart failure exacerbation, asthma exacerbation, flash pulmonary edema, pneumonia, ACS, COVID-19  EM Physician interpretation of Labs & Imaging: . I-STAT 7 panel (venous) with a pH of 7.20, PCO2 47.3, PO2 91, bicarb 18.9, base deficit 9 . Leukocytosis with WBC 15.6, platelets 562 . Metabolic panel with decreased bicarb, CO2 17, hyperglycemia with glucose 292, serum creatinine is 1.34 which appears to be at patient's baseline. . Initial serum troponin 17 . Serum BNP is elevated at 427, last for comparison was 358 five days ago  Medical Decision Making:  Hannah [name] is a 73 y.o. female with the above past medical history presented to the emergency department today with acute onset of shortness of breath, 2 days after being discharged from this hospital for acute shortness of breath with pulmonary edema.  She arrived afebrile and severely ill with acute respiratory distress, on a nonrebreather with tachypnea and hypoxia.  No IV on arrival.  IV was placed by staff at this facility.  She was placed on a nonrebreather at 15 L/min.  She was set up in high fowler's position and continued on NRB as well as HFNC at 10LPM applied. Her BLS are concerning for acute flash pulmonary edema.  40 mg of IV Lasix given as well as patient started on a nitroglycerin drip, with frequent reassessment and close monitoring in an attempt to avoid having to intubate this patient.   Patient did significantly improve with this treatment modality, and was able to successfully avoid being intubated.   I discussed this patient's case with the pulmonary/critical care attending for admission to the ICU, however, given the vast improvement in the patient's clinical state, we will re-triage  to the progressive care/intermediate care unit.  Discussed case with family medicine teaching service who has accepted the patient for admission to their service.  The plan for this patient was discussed with my attending physician, Dr. Marda Stalker, who voiced agreement and who oversaw evaluation and treatment of this patient.   CLINICAL IMPRESSION: 1. Flash pulmonary edema (HCC)   2. Shortness of breath      Disposition: Admit  Nakyiah Kuck A. Jimmye Norman, MD Resident Physician, PGY-3 Emergency Medicine [facility]    Jefm Petty, MD 11/11/18 [phone]    Tegeler, Gwenyth Allegra, MD 11/13/18 [phone]

## 2018-11-10 NOTE — H&P (Addendum)
[facility] Admission History and Physical Service Pager: [phone]  [name] Medical record number: [id number] Date of birth: [birth year] Age: 73 y.o. Gender: female  Primary Care Provider: [name], MD Consultants:  Code Status: FULL  Preferred Emergency Contact: [name], daughter, [phone] (healthcare power of attorney) Chief Complaint: shortness of breath   Assessment and Plan: Hannah [name] is a 73 y.o. female presenting with acute dyspnea. PMH is significant for recent GI bleed, HTN, HLD, GERD, anxiety, asthma, anaemia.  Shortness of breath  Pt endorses progressive worsening of SOB, after recently being discharged for pulmonary edema on 7/21.  On previous admissions, pt developed SOB, after RBC transfusions secondary to symptomatic anemia, that responded well to diuresis. There are a wide range of differentials to consider in this patient including acute CHF, cardiac arrthymia, anemia, pulmonary embolism, pneumonia, ACS and COPD/asthma. However, ultimately suspect this is multifactorial in the setting of probable diastolic dysfunction, hypoalbuminemia, anemia, and potential cardiac arrhythmia vs ACS, with contributing anxiety that worsened presentation. Echo 7/13 showed EF 60-65%, mild to moderate MR, and diastolic pseudonormalization.  Findings consistent with CHF on this admission include acute SOB, saturations in 80s, bibasilar crackles, BNP 427 (increased from previous 358), and increased pulmonary edema on CXR. Cardiac arrthymia may be precipitating above event as patient endorsed palpitations prior to acute worsening SOB. However reassured EKG showed sinus tachycardia/potential LBBB similar to previous on admit. ACS workup still pending, trops 17 and 59, suspect increase secondary to demand, reassured no ischemic changes on EKG or associated CP.  Anemia unlikely sole source of SOB, 10.4 today from 8.1 on 7/21.  PE should be considered given pt's tachycardia and hypoxia, however given significant improvement after nitro gtt and Lasix with progressive nature of SOB, suspect more likely to above.  Regardless, will consider pursuing repeat CTA (CTA on 7/19 neg for PE) if clinical status does not continue to improve with diuresis. Pt has been feeling unwell over the last 2 days with general malaise, weakness and anorexia. WBC today 15.6, from 8 on 7/21. This could be the start of an indolent respiratory tract infection. However pt has no cough or pyrexia.  COVID negative on admit.  Will treat as an acute CHF exacerbation currently. Critical care consulted in the ED, however given significant improvement with above measures, will admit to progressive. -Admit to progressive floor-Dr Nori Riis -Strict I's and O's, daily weights -s/p Lasix 40 mg in the ED, re-dose in the a.m. -Wean off Nitroglycerin gtt  -Pulse ox, wean off oxygen as able, already transitioned to nasal cannula (4L) from initial NRB -Monitor telemetry-monitor for arrhthymias -Blood and urine cultures -Troponin, BMP, CBC -Can consider cardiology consult vs outpatient follow-up  Presyncope vs syncope: Acute. Varying history per patient and daughter. Reported lightheadedness/dizziness after onset of palpitations/SOB.  Similar episode experienced after acute SOB on last admission that resolved with diuresis.  Question if secondary to hypoxemia with pulmonary edema, cardiac arrhythmia, PE will still remain within differential.  Unrelated to position changes, less likely to be orthostatic. - Continuous telemetry - Plan as above   Recent admission for GI Bleed-stable Had coil embolization for ectasia of trifurcation of GDA. No further symptoms of bleeding Hb 10.4 (improved from last admission 8.1 on 7/21)  -Daily CBC -SCDs for DVT prophylaxis given above, however will consider starting prophylactic Lovenox if hemoglobin remains stable  Anemia-stable Hb  10.4 today, MCV 94 On ferrous sulfate 325mg  once daily -Daily CBC, continue  to monitor  -Continue ferrous sulfate 325mg  once daily  CKD Stage IIIb-stable  Cr 1.34 (AKI improved from 1.56 on 7/24), GFR 39 -Daily BMP as on Lasix  -Avoid nephrotoxic medications   Anxiety-stable Believe this likely contributed to worsened presentation, however not likely etiology of initial shortness of breath.  Given current respiratory status, will hold Xanax for now.  Can consider restarting later in hospitalization. - Continue home Zoloft, 25 mg, consider increasing to 50 - Hold home Xanax 0.25 mg nightly, may restart pending respiratory status  Hypertension-chronic, stable. BP varying since arrival, most recently 108/61, was on lisinopril on previous admission-discontinued.  Continues to take Coreg 12.5 twice daily. - We will hold home Coreg at this time given lower pressures with nitro drip, may restart in future - Monitor BP  HLD-chronic, stable Atorvastatin 40mg  once daily -Continue  Cognitive decline-chronic Progressive intermittent dementia, regular sundowning CT head 2/5-mild microvascular ischemic disease without any acute process Pt can be alert and orientated X 4 but can change intermittently  Pt responds well to breathing exercises and sitter when sundowning -Reorientation, breathing exercise and physical sitter when sundowning   History of elevated TSH-investigating TSH 5.53 on 7/19 -Check T4  -continue to monitor   Hyperglycemia  CBG quite elevated 292 on BMP, however A1c 5.3 on 11/06/2018.  No associated ketonuria.  Suspect may be in the setting of acute distress. - Monitor glucose on BMP  Hypomagnesemia  Last Mg 1.7 on 7/19 -Check Mg today and replete if low   Severe protein calorie malnutrition Albumin 3.4. 2.7 on 7/12.  -Continue Ensure shakes to supplement nutrition.  GERD Currently asymptomatic Takes omeprazole 20 mg as needed at home. -Can add on PPI if  needed  FEN/GI: Heart healthy Prophylaxis: SCDs  Disposition: pending resolution of clinical symptoms   History of Present Illness:  Hannah [name] is a 73 y.o. female presenting with acute dyspnea. She was recently discharged from hospital on 7/21 for acute dyspnea with unclear etiology. She had acute dyspnea this evening.   Collateral history from daughter at 9 on 7/24. She reports pt has not been feeling well since she left hospital 2 days ago. Has been feeling weak, tired with decreased appetite. Took her temperature yesterday throughout the day as was concerned about COVID-pt was apyrexial. This morning pt woke up "screaming" with SOB and drenched in sweat. She recovered from the tachypnea and calmed down after a few minutes. She was reporting increased SOB around 3-4pm and looking visibly tachypneic again. Daughter asked pt if she wanted her to call EMS but pt refused. She remained sitting in her chair for the remainder of the evening. She has another episode of acute SOB around 7.45pm whilst sitting in her chair and complained that she could not breathe and that she could feel her legs. She also made some "garling noises" and has "nonsenical speech". Her eyes closed for 3-4 minutes and pt was unresponsive until the time the EMS came.  Patient endorsed that she did not lose consciousness.  Daughter denies unilateral limb weakness, slurred speech or facial droop. Reports shaking movements similar to chills but no tongue biting or urinary incontinence.  With the EMS her RR 40-50s and sats were 80% on room air. She was given CPAP but not tolerated fully and then a non-rebreathe mask with oxygen 15L. In the ER her her labs showed: Na 135, K 3.81m Cr 1.34, BUN 23, Hb 12.6, BNP 427 and Trop 17. CXR showed pulmonary edema with  small bilateral pleural effusions. She was give 1 X 40mg  IV furosemide and 50mg  IV nitroglycerin gtt. after which her condition rapidly improved.   Upon questioning in the ER  pt reported similar events to the above. Reports not feeling well (congested and weak) after being discharged from hospital on 7/21. Emphasized the recurrent SOB episodes and feeling like she was going to pass out. Reports palpitations, dizziness and diaphoresis. Denies chest pain, fevers, cough, nausea, vomiting, abdominal pain, urinary symptoms or change in bowel habit.  Says she had a little dry cough previously, however has almost resolved.   Review Of Systems: Per HPI with the following additions:   Review of Systems  Constitutional: Positive for diaphoresis and malaise/fatigue. Negative for fever and weight loss.  HENT: Positive for congestion.   Respiratory: Positive for shortness of breath. Negative for cough.   Cardiovascular: Positive for palpitations. Negative for chest pain and leg swelling.  Gastrointestinal: Negative for abdominal pain, blood in stool, nausea and vomiting.  Genitourinary: Negative for dysuria and hematuria.  Musculoskeletal: Negative for falls.  Neurological: Positive for dizziness, speech change, loss of consciousness and weakness. Negative for focal weakness and seizures.    Patient Active Problem List   Diagnosis Date Noted  . CHF (congestive heart failure) ([place]) 11/06/2018  . Acute pulmonary edema ([facility])   . Gastrointestinal hemorrhage, unspecified   . Gastric bleed   . Symptomatic anemia 10/27/2018  . Right foot pain 06/22/2018  . Inability to bear weight 06/22/2018  . Left foot pain 05/31/2018  . Elevated TSH 02/03/2018  . Cerebral microvascular disease 02/02/2018  . Renal insufficiency 11/09/2017  . Gout 09/11/2015  . OBESITY 03/26/2010  . Anxiety state 08/09/2008  . Essential hypertension 08/09/2008  . GERD 08/09/2008    Past Medical History: Past Medical History:  Diagnosis Date  . Anemia   . Anxiety   . Arthritis    "hands" (05/09/2015)  . GERD (gastroesophageal reflux disease)   . High cholesterol   . History of blood transfusion  1960s; 05/08/2015   "when I had my kids; low HgB"  . Hypertension   . Osteoarthritis   . Pneumonia 05/2004    Past Surgical History: Past Surgical History:  Procedure Laterality Date  . BIOPSY  10/28/2018   Procedure: BIOPSY;  Surgeon: Juanita Craver, MD;  Location: [facility] ENDOSCOPY;  Service: Endoscopy;;  . CARDIAC CATHETERIZATION  05/2004  . COLONOSCOPY  07/2002   Archie Endo 09/02/2010  . COLONOSCOPY WITH PROPOFOL N/A 10/28/2018   Procedure: COLONOSCOPY WITH PROPOFOL;  Surgeon: Juanita Craver, MD;  Location: [facility] ENDOSCOPY;  Service: Endoscopy;  Laterality: N/A;  . ESOPHAGOGASTRODUODENOSCOPY  07/2002   w/biopsies/notes 09/02/2010  . ESOPHAGOGASTRODUODENOSCOPY (EGD) WITH PROPOFOL N/A 10/28/2018   Procedure: ESOPHAGOGASTRODUODENOSCOPY (EGD) WITH PROPOFOL;  Surgeon: Juanita Craver, MD;  Location: [facility] ENDOSCOPY;  Service: Endoscopy;  Laterality: N/A;  . HOT HEMOSTASIS N/A 10/28/2018   Procedure: HOT HEMOSTASIS (ARGON PLASMA COAGULATION/BICAP);  Surgeon: Juanita Craver, MD;  Location: [facility] ENDOSCOPY;  Service: Endoscopy;  Laterality: N/A;  . IR ANGIOGRAM SELECTIVE EACH ADDITIONAL VESSEL  10/30/2018  . IR ANGIOGRAM SELECTIVE EACH ADDITIONAL VESSEL  10/30/2018  . IR ANGIOGRAM SELECTIVE EACH ADDITIONAL VESSEL  10/30/2018  . IR ANGIOGRAM SELECTIVE EACH ADDITIONAL VESSEL  10/30/2018  . IR ANGIOGRAM VISCERAL SELECTIVE  10/30/2018  . IR EMBO ART  VEN HEMORR LYMPH EXTRAV  INC GUIDE ROADMAPPING  10/30/2018  . IR US GUIDANCE  10/30/2018  . POLYPECTOMY  10/28/2018   Procedure: POLYPECTOMY;  Surgeon: Juanita Craver, MD;  Location: [facility] ENDOSCOPY;  Service: Endoscopy;;    Social History: Social History   Tobacco Use  . Smoking status: Former Smoker    Packs/day: 0.30    Years: 8.00    Pack years: 2.40    Types: Cigarettes    Quit date: 05/30/2004    Years since quitting: 14.4  . Smokeless tobacco: Never Used  Substance Use Topics  . Alcohol use: No    Alcohol/week: 0.0 standard drinks  . Drug use: Never   Additional social  history: daughter and granddaughter living with pt currently post hospitalization Ex smoker Nil ETOH   Please also refer to relevant sections of EMR.  Family History: Family History  Problem Relation Age of Onset  . Heart disease Mother        died at age 71  . Breast cancer Sister   . Cancer Sister        Breast  . Hypertension Father   . Hypertension Sister     Allergies and Medications: No Known Allergies No current facility-administered medications on file prior to encounter.    Current Outpatient Medications on File Prior to Encounter  Medication Sig Dispense Refill  . acetaminophen (TYLENOL) 325 MG tablet Take 650 mg by mouth every 6 (six) hours as needed for mild pain or headache.    . ALPRAZolam (XANAX) 0.25 MG tablet TAKE 1 TABLET (0.25 MG TOTAL) BY MOUTH AT BEDTIME AS NEEDED. (Patient taking differently: Take 0.25 mg by mouth at bedtime as needed for anxiety or sleep. ) 30 tablet 0  . atorvastatin (LIPITOR) 40 MG tablet Take 1 tablet (40 mg total) by mouth daily. 90 tablet 3  . carvedilol (COREG) 12.5 MG tablet Take 1 tablet (12.5 mg total) by mouth 2 (two) times daily with a meal. 180 tablet 3  . feeding supplement, ENSURE ENLIVE, (ENSURE ENLIVE) LIQD Take 237 mLs by mouth 2 (two) times daily between meals. 237 mL 12  . ferrous sulfate 325 (65 FE) MG tablet Take 325 mg by mouth daily with breakfast.    . omeprazole (PRILOSEC) 20 MG capsule TAKE 1 CAPSULE BY MOUTH DAILY AS NEEDED (Patient taking differently: Take 20 mg by mouth as needed. ) 180 capsule 0  . sertraline (ZOLOFT) 50 MG tablet TAKE 1/2 TABLET BY MOUTH DAILY (Patient taking differently: Take 50 mg by mouth daily. ) 45 tablet 0    Objective: BP (!) 99/49   Pulse (!) 106   Temp 98.2 F (36.8 C) (Oral)   Resp 18   SpO2 98%  Exam: General: Alert, laying in bed, no acute distress  Eyes:  Normal extra ocular eye movements, no icterus, normal conjunctiva and normal sclera  ENTM: dry mucous membranes, no  evidence of trauma Neck: supple, no thyromegaly, thyroid cysts or lymphadenopathy  Cardiovascular: faint heart sounds, S1 and S2. No S3 or S4. No rubs or gallops. Capillary refill < 2secs Respiratory: Bibasilar crackles, no wheeze or rales. No acute respiratory distress while on 15 L bag-valve-mask, satting 100% Gastrointestinal: bowel sounds present, abdo soft non tender MSK: able to move all 4 limbs with 5/5 muscle strength in bilateral upper and lower extremities, trace edema of ankles  Derm: ecchymosis, thin skin Neuro: cranial nerves grossly in tact, PERRLA 61mm bilaterally, speech appropriate and intelligible Psych: normal mood, normal affect, anxious   Labs and Imaging: CBC BMET  Recent Labs  Lab 11/10/18 2050 11/10/18 2103  WBC 15.6*  --   HGB 10.4* 12.6  HCT 37.6 37.0  PLT 562*  --    Recent Labs  Lab 11/10/18 2050 11/10/18 2103  NA 135 138  K 3.8 3.8  CL 102  --   CO2 17*  --   BUN 23  --   CREATININE 1.34*  --   GLUCOSE 292*  --   CALCIUM 8.5*  --      EKG: sinus tachycardia   Dg Chest 1 View  Result Date: 11/10/2018 CLINICAL DATA:  Acute shortness of breath today. Decreased oxygen saturation. EXAM: CHEST  1 VIEW COMPARISON:  Radiographs and CT 11/06/2018 FINDINGS: Progressive pulmonary edema, now moderate in degree. Bilateral pleural effusions are similar. Minimal fluid in the right minor fissure. Slight increased heart size since prior exam. Aortic arch atherosclerosis. No pneumothorax. IMPRESSION: Progressive pulmonary edema, now moderate in degree. Small bilateral pleural effusions. Electronically Signed   By: Keith Rake M.D.   On: 11/10/2018 21:25    Lattie Haw, MD 11/10/2018, 10:46 PM PGY-1, [place] Intern pager: [phone], text pages welcome  FPTS Upper-Level Resident Addendum  I have independently interviewed and examined the patient. I have discussed the above with the original author and agree with their  documentation. My edits for correction/addition/clarification are in green.Please see also any attending notes.  Patriciaann Clan, DO Family Medicine PGY-2

## 2018-11-10 NOTE — ED Triage Notes (Signed)
Pt in via [facility], per report pt from home c/o SOB onset today, pt 0.6 mg EPI pta, pt O2 sats 74% on 15 L, pt placed on BIPAP for 5 mins pta, pt anxious, pt tachycardia pta, pt A&O x4, pt tripoding,

## 2018-11-10 NOTE — ED Notes (Signed)
This Rn called the pts daughter and updated her on the pts plan of care, Levada Dy, RN to call and update her once the pt has an assigned bed

## 2018-11-11 ENCOUNTER — Other Ambulatory Visit: Payer: Self-pay

## 2018-11-11 ENCOUNTER — Encounter (HOSPITAL_COMMUNITY): Payer: Self-pay

## 2018-11-11 DIAGNOSIS — E785 Hyperlipidemia, unspecified: Secondary | ICD-10-CM | POA: Diagnosis present

## 2018-11-11 DIAGNOSIS — E7800 Pure hypercholesterolemia, unspecified: Secondary | ICD-10-CM | POA: Diagnosis present

## 2018-11-11 DIAGNOSIS — I5033 Acute on chronic diastolic (congestive) heart failure: Secondary | ICD-10-CM | POA: Diagnosis not present

## 2018-11-11 DIAGNOSIS — Z803 Family history of malignant neoplasm of breast: Secondary | ICD-10-CM | POA: Diagnosis not present

## 2018-11-11 DIAGNOSIS — M109 Gout, unspecified: Secondary | ICD-10-CM | POA: Diagnosis present

## 2018-11-11 DIAGNOSIS — N179 Acute kidney failure, unspecified: Secondary | ICD-10-CM | POA: Diagnosis not present

## 2018-11-11 DIAGNOSIS — E669 Obesity, unspecified: Secondary | ICD-10-CM | POA: Diagnosis present

## 2018-11-11 DIAGNOSIS — F0390 Unspecified dementia without behavioral disturbance: Secondary | ICD-10-CM | POA: Diagnosis present

## 2018-11-11 DIAGNOSIS — K219 Gastro-esophageal reflux disease without esophagitis: Secondary | ICD-10-CM | POA: Diagnosis present

## 2018-11-11 DIAGNOSIS — R739 Hyperglycemia, unspecified: Secondary | ICD-10-CM | POA: Diagnosis present

## 2018-11-11 DIAGNOSIS — Z87891 Personal history of nicotine dependence: Secondary | ICD-10-CM | POA: Diagnosis not present

## 2018-11-11 DIAGNOSIS — J810 Acute pulmonary edema: Secondary | ICD-10-CM

## 2018-11-11 DIAGNOSIS — I447 Left bundle-branch block, unspecified: Secondary | ICD-10-CM | POA: Diagnosis present

## 2018-11-11 DIAGNOSIS — F411 Generalized anxiety disorder: Secondary | ICD-10-CM | POA: Diagnosis present

## 2018-11-11 DIAGNOSIS — Z6830 Body mass index (BMI) 30.0-30.9, adult: Secondary | ICD-10-CM | POA: Diagnosis not present

## 2018-11-11 DIAGNOSIS — Z20828 Contact with and (suspected) exposure to other viral communicable diseases: Secondary | ICD-10-CM | POA: Diagnosis present

## 2018-11-11 DIAGNOSIS — M1990 Unspecified osteoarthritis, unspecified site: Secondary | ICD-10-CM | POA: Diagnosis present

## 2018-11-11 DIAGNOSIS — Z8249 Family history of ischemic heart disease and other diseases of the circulatory system: Secondary | ICD-10-CM | POA: Diagnosis not present

## 2018-11-11 DIAGNOSIS — I130 Hypertensive heart and chronic kidney disease with heart failure and stage 1 through stage 4 chronic kidney disease, or unspecified chronic kidney disease: Secondary | ICD-10-CM | POA: Diagnosis not present

## 2018-11-11 DIAGNOSIS — N183 Chronic kidney disease, stage 3 (moderate): Secondary | ICD-10-CM | POA: Diagnosis present

## 2018-11-11 DIAGNOSIS — R0602 Shortness of breath: Secondary | ICD-10-CM | POA: Diagnosis not present

## 2018-11-11 DIAGNOSIS — E43 Unspecified severe protein-calorie malnutrition: Secondary | ICD-10-CM | POA: Diagnosis not present

## 2018-11-11 DIAGNOSIS — D631 Anemia in chronic kidney disease: Secondary | ICD-10-CM | POA: Diagnosis present

## 2018-11-11 LAB — COMPREHENSIVE METABOLIC PANEL
ALT: 13 U/L (ref 0–44)
AST: 21 U/L (ref 15–41)
Albumin: 3.3 g/dL — ABNORMAL LOW (ref 3.5–5.0)
Alkaline Phosphatase: 75 U/L (ref 38–126)
Anion gap: 13 (ref 5–15)
BUN: 23 mg/dL (ref 8–23)
CO2: 22 mmol/L (ref 22–32)
Calcium: 8.6 mg/dL — ABNORMAL LOW (ref 8.9–10.3)
Chloride: 103 mmol/L (ref 98–111)
Creatinine, Ser: 1.27 mg/dL — ABNORMAL HIGH (ref 0.44–1.00)
GFR calc Af Amer: 48 mL/min — ABNORMAL LOW (ref >60)
GFR calc non Af Amer: 41 mL/min — ABNORMAL LOW (ref >60)
Glucose, Bld: 123 mg/dL — ABNORMAL HIGH (ref 70–99)
Potassium: 4.2 mmol/L (ref 3.5–5.1)
Sodium: 138 mmol/L (ref 135–145)
Total Bilirubin: 0.5 mg/dL (ref 0.3–1.2)
Total Protein: 6.8 g/dL (ref 6.5–8.1)

## 2018-11-11 LAB — CBC
HCT: 29.7 % — ABNORMAL LOW (ref 36.0–46.0)
HCT: 28.4 % — ABNORMAL LOW (ref 36.0–46.0)
Hemoglobin: 8.8 g/dL — ABNORMAL LOW (ref 12.0–15.0)
Hemoglobin: 8.6 g/dL — ABNORMAL LOW (ref 12.0–15.0)
MCH: 27.1 pg (ref 26.0–34.0)
MCH: 26.9 pg (ref 26.0–34.0)
MCHC: 29.6 g/dL — ABNORMAL LOW (ref 30.0–36.0)
MCHC: 30.3 g/dL (ref 30.0–36.0)
MCV: 91.4 fL (ref 80.0–100.0)
MCV: 88.8 fL (ref 80.0–100.0)
Platelets: 395 10*3/uL (ref 150–400)
Platelets: 349 10*3/uL (ref 150–400)
RBC: 3.25 MIL/uL — ABNORMAL LOW (ref 3.87–5.11)
RBC: 3.2 MIL/uL — ABNORMAL LOW (ref 3.87–5.11)
RDW: 15.9 % — ABNORMAL HIGH (ref 11.5–15.5)
RDW: 16 % — ABNORMAL HIGH (ref 11.5–15.5)
WBC: 12.5 10*3/uL — ABNORMAL HIGH (ref 4.0–10.5)
WBC: 9.2 10*3/uL (ref 4.0–10.5)
nRBC: 0 % (ref 0.0–0.2)
nRBC: 0 % (ref 0.0–0.2)

## 2018-11-11 LAB — MRSA PCR SCREENING: MRSA by PCR: NEGATIVE

## 2018-11-11 LAB — URINALYSIS, ROUTINE W REFLEX MICROSCOPIC
Bilirubin Urine: NEGATIVE
Glucose, UA: NEGATIVE mg/dL
Hgb urine dipstick: NEGATIVE
Ketones, ur: NEGATIVE mg/dL
Nitrite: NEGATIVE
Protein, ur: NEGATIVE mg/dL
Specific Gravity, Urine: 1.006 (ref 1.005–1.030)
pH: 5 (ref 5.0–8.0)

## 2018-11-11 LAB — TROPONIN I (HIGH SENSITIVITY)
Troponin I (High Sensitivity): 59 ng/L — ABNORMAL HIGH (ref <18)
Troponin I (High Sensitivity): 53 ng/L — ABNORMAL HIGH (ref <18)

## 2018-11-11 LAB — MAGNESIUM: Magnesium: 1.7 mg/dL (ref 1.7–2.4)

## 2018-11-11 LAB — T4, FREE: Free T4: 0.77 ng/dL (ref 0.61–1.12)

## 2018-11-11 MED ORDER — ENSURE ENLIVE PO LIQD
237.0000 mL | Freq: Two times a day (BID) | ORAL | Status: DC
  Administered 2018-11-11 – 2018-11-12 (×3): 237 mL via ORAL

## 2018-11-11 MED ORDER — HALOPERIDOL 0.5 MG PO TABS: 0.5000 mg | ORAL_TABLET | Freq: Once | ORAL | Status: DC

## 2018-11-11 MED ORDER — ACETAMINOPHEN 325 MG PO TABS: 650.0000 mg | ORAL_TABLET | Freq: Four times a day (QID) | ORAL | Status: DC | PRN

## 2018-11-11 MED ORDER — FERROUS SULFATE 325 (65 FE) MG PO TABS
325.0000 mg | ORAL_TABLET | Freq: Every day | ORAL | Status: DC
  Administered 2018-11-11 – 2018-11-12 (×2): 325 mg via ORAL
  Filled 2018-11-11 (×2): qty 1

## 2018-11-11 MED ORDER — ATORVASTATIN CALCIUM 40 MG PO TABS
40.0000 mg | ORAL_TABLET | Freq: Every day | ORAL | Status: DC
  Administered 2018-11-11 – 2018-11-12 (×2): 40 mg via ORAL
  Filled 2018-11-11 (×2): qty 1

## 2018-11-11 MED ORDER — SODIUM CHLORIDE 0.9% FLUSH
10.0000 mL | Freq: Two times a day (BID) | INTRAVENOUS | Status: DC
  Administered 2018-11-11 (×2): 10 mL

## 2018-11-11 MED ORDER — HALOPERIDOL LACTATE 5 MG/ML IJ SOLN
2.0000 mg | Freq: Once | INTRAMUSCULAR | Status: AC
  Filled 2018-11-11: qty 1

## 2018-11-11 MED ORDER — SODIUM CHLORIDE 0.9% FLUSH: 10.0000 mL | INTRAVENOUS | Status: DC | PRN

## 2018-11-11 MED ORDER — ENOXAPARIN SODIUM 40 MG/0.4ML ~~LOC~~ SOLN: 40.0000 mg | SUBCUTANEOUS | Status: DC

## 2018-11-11 MED ORDER — FUROSEMIDE 40 MG PO TABS
40.0000 mg | ORAL_TABLET | Freq: Every day | ORAL | Status: DC
  Administered 2018-11-12: 40 mg via ORAL
  Filled 2018-11-11: qty 1

## 2018-11-11 MED ORDER — METOPROLOL TARTRATE 12.5 MG HALF TABLET
12.5000 mg | ORAL_TABLET | Freq: Two times a day (BID) | ORAL | Status: DC
  Administered 2018-11-11 – 2018-11-12 (×3): 12.5 mg via ORAL
  Filled 2018-11-11 (×3): qty 1

## 2018-11-11 MED ORDER — HALOPERIDOL 1 MG PO TABS
2.0000 mg | ORAL_TABLET | Freq: Once | ORAL | Status: AC
  Administered 2018-11-11: 2 mg via ORAL
  Filled 2018-11-11: qty 1
  Filled 2018-11-11: qty 2

## 2018-11-11 MED ORDER — POLYETHYLENE GLYCOL 3350 17 G PO PACK: 17.0000 g | PACK | Freq: Every day | ORAL | Status: DC | PRN

## 2018-11-11 MED ORDER — ALPRAZOLAM 0.25 MG PO TABS: 0.2500 mg | ORAL_TABLET | Freq: Every evening | ORAL | Status: DC | PRN

## 2018-11-11 MED ORDER — SERTRALINE HCL 25 MG PO TABS
25.0000 mg | ORAL_TABLET | Freq: Every day | ORAL | Status: DC
  Administered 2018-11-11 – 2018-11-12 (×2): 25 mg via ORAL
  Filled 2018-11-11 (×2): qty 1

## 2018-11-11 MED ORDER — HALOPERIDOL LACTATE 5 MG/ML IJ SOLN: 0.5000 mg | Freq: Once | INTRAMUSCULAR | Status: DC

## 2018-11-11 MED ORDER — ACETAMINOPHEN 650 MG RE SUPP: 650.0000 mg | Freq: Four times a day (QID) | RECTAL | Status: DC | PRN

## 2018-11-11 NOTE — Progress Notes (Signed)
Safety sitter unavailable from staffing. Pt getting out of bed stating "she is not a pt in the hospital and needs to get home." staff have attempted to redirect the pt. MD paged for safety concern. Will continue to monitor.  Amanda Cockayne, RN

## 2018-11-11 NOTE — Progress Notes (Signed)
Patient has had increased confusion and agitation. Patient constantly pulls oxygen sat sensor off finger. And oxygen  on and off. Constantly redirected and reoriented to place, time  and situation. Patient states she is leaving looking for personal items and shoes. Bed alarm on and daughter called several times to talk to patient to calm her down Md Armed forces operational officer ordered.

## 2018-11-11 NOTE — ED Notes (Signed)
ED TO INPATIENT HANDOFF REPORT  ED Nurse Name and Phone #: Callie Fielding #[phone]  S Name/Age/Gender Hannah [name] 73 y.o. female Room/Bed: TRAAC/TRAAC  Code Status   Code Status: Prior  Home/SNF/Other Home Patient oriented to: self, place, time and situation Is this baseline? Yes   Triage Complete: Triage complete  Chief Complaint Resp. Distress   Triage Note Pt in via [facility], per report pt from home c/o SOB onset today, pt 0.6 mg EPI pta, pt O2 sats 74% on 15 L, pt placed on BIPAP for 5 mins pta, pt anxious, pt tachycardia pta, pt A&O x4, pt tripoding,   Allergies No Known Allergies  Level of Care/Admitting Diagnosis ED Disposition    ED Disposition Condition [place]: [place] [[id number]]  Level of Care: Progressive [102]  Covid Evaluation: Confirmed COVID Negative  Diagnosis: Acute pulmonary edema [facility]) [[id number]]  Admitting Physician: Lattie Haw [[id number]]  Attending Physician: Zenia Resides [[id number]]  Estimated length of stay: 3 - 4 days  Certification:: I certify this patient will need inpatient services for at least 2 midnights  PT Class (Do Not Modify): Inpatient [101]  PT Acc Code (Do Not Modify): Private [1]       B Medical/Surgery History Past Medical History:  Diagnosis Date  . Anemia   . Anxiety   . Arthritis    "hands" (05/09/2015)  . GERD (gastroesophageal reflux disease)   . High cholesterol   . History of blood transfusion 1960s; 05/08/2015   "when I had my kids; low HgB"  . Hypertension   . Osteoarthritis   . Pneumonia 05/2004   Past Surgical History:  Procedure Laterality Date  . BIOPSY  10/28/2018   Procedure: BIOPSY;  Surgeon: Juanita Craver, MD;  Location: [facility] ENDOSCOPY;  Service: Endoscopy;;  . CARDIAC CATHETERIZATION  05/2004  . COLONOSCOPY  07/2002   Archie Endo 09/02/2010  . COLONOSCOPY WITH PROPOFOL N/A 10/28/2018   Procedure: COLONOSCOPY WITH PROPOFOL;  Surgeon: Juanita Craver, MD;  Location:  [facility] ENDOSCOPY;  Service: Endoscopy;  Laterality: N/A;  . ESOPHAGOGASTRODUODENOSCOPY  07/2002   w/biopsies/notes 09/02/2010  . ESOPHAGOGASTRODUODENOSCOPY (EGD) WITH PROPOFOL N/A 10/28/2018   Procedure: ESOPHAGOGASTRODUODENOSCOPY (EGD) WITH PROPOFOL;  Surgeon: Juanita Craver, MD;  Location: [facility] ENDOSCOPY;  Service: Endoscopy;  Laterality: N/A;  . HOT HEMOSTASIS N/A 10/28/2018   Procedure: HOT HEMOSTASIS (ARGON PLASMA COAGULATION/BICAP);  Surgeon: Juanita Craver, MD;  Location: [facility] ENDOSCOPY;  Service: Endoscopy;  Laterality: N/A;  . IR ANGIOGRAM SELECTIVE EACH ADDITIONAL VESSEL  10/30/2018  . IR ANGIOGRAM SELECTIVE EACH ADDITIONAL VESSEL  10/30/2018  . IR ANGIOGRAM SELECTIVE EACH ADDITIONAL VESSEL  10/30/2018  . IR ANGIOGRAM SELECTIVE EACH ADDITIONAL VESSEL  10/30/2018  . IR ANGIOGRAM VISCERAL SELECTIVE  10/30/2018  . IR EMBO ART  VEN HEMORR LYMPH EXTRAV  INC GUIDE ROADMAPPING  10/30/2018  . IR US GUIDANCE  10/30/2018  . POLYPECTOMY  10/28/2018   Procedure: POLYPECTOMY;  Surgeon: Juanita Craver, MD;  Location: [facility] ENDOSCOPY;  Service: Endoscopy;;     A IV Location/Drains/Wounds Patient Lines/Drains/Airways Status   Active Line/Drains/Airways    Name:   Placement date:   Placement time:   Site:   Days:   Peripheral IV 11/10/18 Right Hand   11/10/18    2105    Hand   1   Peripheral IV 11/10/18 Left Hand   11/10/18    2105    Hand   1  Intake/Output Last 24 hours No intake or output data in the 24 hours ending 11/11/18 0020  Labs/Imaging Results for orders placed or performed during the hospital encounter of 11/10/18 (from the past 48 hour(s))  Brain natriuretic peptide     Status: Abnormal   Collection Time: 11/10/18  8:50 PM  Result Value Ref Range   B Natriuretic Peptide 427.4 (H) 0.0 - 100.0 pg/mL    Comment: Performed at [facility], [address] [place], [place] [address]  CBC WITH DIFFERENTIAL     Status: Abnormal   Collection Time: 11/10/18  8:50 PM  Result Value Ref Range    WBC 15.6 (H) 4.0 - 10.5 K/uL   RBC 3.98 3.87 - 5.11 MIL/uL   Hemoglobin 10.4 (L) 12.0 - 15.0 g/dL   HCT 37.6 36.0 - 46.0 %   MCV 94.5 80.0 - 100.0 fL   MCH 26.1 26.0 - 34.0 pg   MCHC 27.7 (L) 30.0 - 36.0 g/dL   RDW 15.9 (H) 11.5 - 15.5 %   Platelets 562 (H) 150 - 400 K/uL   nRBC 0.0 0.0 - 0.2 %   Neutrophils Relative % 47 %   Neutro Abs 7.4 1.7 - 7.7 K/uL   Lymphocytes Relative 45 %   Lymphs Abs 7.0 (H) 0.7 - 4.0 K/uL   Monocytes Relative 7 %   Monocytes Absolute 1.1 (H) 0.1 - 1.0 K/uL   Eosinophils Relative 0 %   Eosinophils Absolute 0.0 0.0 - 0.5 K/uL   Basophils Relative 1 %   Basophils Absolute 0.1 0.0 - 0.1 K/uL   Immature Granulocytes 0 %   Abs Immature Granulocytes 0.04 0.00 - 0.07 K/uL    Comment: Performed at [facility], [place] [address] [place], [place] [address]  Comprehensive metabolic panel     Status: Abnormal   Collection Time: 11/10/18  8:50 PM  Result Value Ref Range   Sodium 135 135 - 145 mmol/L   Potassium 3.8 3.5 - 5.1 mmol/L   Chloride 102 98 - 111 mmol/L   CO2 17 (L) 22 - 32 mmol/L   Glucose, Bld 292 (H) 70 - 99 mg/dL   BUN 23 8 - 23 mg/dL   Creatinine, Ser 1.34 (H) 0.44 - 1.00 mg/dL   Calcium 8.5 (L) 8.9 - 10.3 mg/dL   Total Protein 7.1 6.5 - 8.1 g/dL   Albumin 3.4 (L) 3.5 - 5.0 g/dL   AST 27 15 - 41 U/L   ALT 13 0 - 44 U/L   Alkaline Phosphatase 113 38 - 126 U/L   Total Bilirubin 0.3 0.3 - 1.2 mg/dL   GFR calc non Af Amer 39 (L) >60 mL/min   GFR calc Af Amer 45 (L) >60 mL/min   Anion gap 16 (H) 5 - 15    Comment: Performed at [facility], [place] [address] [place], [place] [address]  Troponin I (High Sensitivity)     Status: None   Collection Time: 11/10/18  8:50 PM  Result Value Ref Range   Troponin I (High Sensitivity) 17 <18 ng/L    Comment: (NOTE) Elevated high sensitivity troponin I (hsTnI) values and significant  changes across serial measurements may suggest ACS but many other  chronic and acute conditions are known to  elevate hsTnI results.  Refer to the "Links" section for chest pain algorithms and additional  guidance. Performed at [facility], [place] [address] [place], [place] [address]   POCT I-Stat EG7  Status: Abnormal   Collection Time: 11/10/18  9:03 PM  Result Value Ref Range   pH, Ven 7.209 (L) 7.250 - 7.430   pCO2, Ven 47.3 44.0 - 60.0 mmHg   pO2, Ven 91.0 (H) 32.0 - 45.0 mmHg   Bicarbonate 18.9 (L) 20.0 - 28.0 mmol/L   TCO2 20 (L) 22 - 32 mmol/L   O2 Saturation 95.0 %   Acid-base deficit 9.0 (H) 0.0 - 2.0 mmol/L   Sodium 138 135 - 145 mmol/L   Potassium 3.8 3.5 - 5.1 mmol/L   Calcium, Ion 1.12 (L) 1.15 - 1.40 mmol/L   HCT 37.0 36.0 - 46.0 %   Hemoglobin 12.6 12.0 - 15.0 g/dL   Patient temperature HIDE    Sample type VENOUS   SARS Coronavirus 2 (CEPHEID- Performed in [place] hospital lab), Hosp Order     Status: None   Collection Time: 11/10/18  9:04 PM   Specimen: Nasopharyngeal Swab  Result Value Ref Range   SARS Coronavirus 2 NEGATIVE NEGATIVE    Comment: (NOTE) If result is NEGATIVE SARS-CoV-2 target nucleic acids are NOT DETECTED. The SARS-CoV-2 RNA is generally detectable in upper and lower  respiratory specimens during the acute phase of infection. The lowest  concentration of SARS-CoV-2 viral copies this assay can detect is 250  copies / mL. A negative result does not preclude SARS-CoV-2 infection  and should not be used as the sole basis for treatment or other  patient management decisions.  A negative result may occur with  improper specimen collection / handling, submission of specimen other  than nasopharyngeal swab, presence of viral mutation(s) within the  areas targeted by this assay, and inadequate number of viral copies  (<250 copies / mL). A negative result must be combined with clinical  observations, patient history, and epidemiological information. If result is POSITIVE SARS-CoV-2 target nucleic acids are DETECTED. The SARS-CoV-2 RNA is  generally detectable in upper and lower  respiratory specimens dur ing the acute phase of infection.  Positive  results are indicative of active infection with SARS-CoV-2.  Clinical  correlation with patient history and other diagnostic information is  necessary to determine patient infection status.  Positive results do  not rule out bacterial infection or co-infection with other viruses. If result is PRESUMPTIVE POSTIVE SARS-CoV-2 nucleic acids MAY BE PRESENT.   A presumptive positive result was obtained on the submitted specimen  and confirmed on repeat testing.  While 2019 novel coronavirus  (SARS-CoV-2) nucleic acids may be present in the submitted sample  additional confirmatory testing may be necessary for epidemiological  and / or clinical management purposes  to differentiate between  SARS-CoV-2 and other Sarbecovirus currently known to infect humans.  If clinically indicated additional testing with an alternate test  methodology [phone]) is advised. The SARS-CoV-2 RNA is generally  detectable in upper and lower respiratory sp ecimens during the acute  phase of infection. The expected result is Negative. Fact Sheet for Patients:  StrictlyIdeas.no Fact Sheet for Healthcare Providers: BankingDealers.co.za This test is not yet approved or cleared by the [place] FDA and has been authorized for detection and/or diagnosis of SARS-CoV-2 by FDA under an Emergency Use Authorization (EUA).  This EUA will remain in effect (meaning this test can be used) for the duration of the COVID-19 declaration under Section 564(b)(1) of the Act, 21 U.S.C. section 360bbb-3(b)(1), unless the authorization is terminated or revoked sooner. Performed at [facility], [place] [address], [place], [place] [address]  Dg Chest 1 View  Result Date: 11/10/2018 CLINICAL DATA:  Acute shortness of breath today. Decreased oxygen saturation. EXAM: CHEST   1 VIEW COMPARISON:  Radiographs and CT 11/06/2018 FINDINGS: Progressive pulmonary edema, now moderate in degree. Bilateral pleural effusions are similar. Minimal fluid in the right minor fissure. Slight increased heart size since prior exam. Aortic arch atherosclerosis. No pneumothorax. IMPRESSION: Progressive pulmonary edema, now moderate in degree. Small bilateral pleural effusions. Electronically Signed   By: Keith Rake M.D.   On: 11/10/2018 21:25    Pending Labs Unresulted Labs (From admission, onward)    Start     Ordered   11/10/18 2055  Urinalysis, Routine w reflex microscopic  Once,   STAT     11/10/18 2054   Signed and Held  CBC  (enoxaparin (LOVENOX)    CrCl >/= 30 ml/min)  Once,   R    Comments: Baseline for enoxaparin therapy IF NOT ALREADY DRAWN.  Notify MD if PLT < 100 K.    Signed and Held   Signed and Held  Creatinine, serum  (enoxaparin (LOVENOX)    CrCl >/= 30 ml/min)  Once,   R    Comments: Baseline for enoxaparin therapy IF NOT ALREADY DRAWN.    Signed and Held   Signed and Held  Creatinine, serum  (enoxaparin (LOVENOX)    CrCl >/= 30 ml/min)  Weekly,   R    Comments: while on enoxaparin therapy    Signed and Held   Signed and Held  Comprehensive metabolic panel  Tomorrow morning,   R     Signed and Held   Signed and Held  CBC  Tomorrow morning,   R     Signed and Held          Vitals/Pain Today's Vitals   11/10/18 2238 11/10/18 2245 11/10/18 2300 11/11/18 0015  BP:  (!) 100/55 (!) 101/53 98/66  Pulse: (!) 106 (!) 102 (!) 103   Resp: 18 (!) 26 11 19   Temp:      TempSrc:      SpO2: 98% 98% 99%   PainSc:        Isolation Precautions No active isolations  Medications Medications  nitroGLYCERIN 50 mg in dextrose 5 % 250 mL (0.2 mg/mL) infusion (50 mcg/min Intravenous New Bag/Given 11/10/18 2112)  LORazepam (ATIVAN) 2 MG/ML injection (0.5 mg  Given 11/10/18 2055)  furosemide (LASIX) injection 40 mg (40 mg Intravenous Given 11/10/18 2106)     Mobility walks High fall risk   Focused Assessments Cardiac Assessment Handoff:  Cardiac Rhythm: Sinus tachycardia No results found for: CKTOTAL, CKMB, CKMBINDEX, TROPONINI Lab Results  Component Value Date   DDIMER 3.41 (H) 11/06/2018   Does the Patient currently have chest pain? No     R Recommendations: See Admitting Provider Note  Report given to:   Additional Notes: Alert oriented x 4

## 2018-11-11 NOTE — Consult Note (Signed)
Cardiology Consultation:   Patient ID: Hannah [name] MRN: [id number]; DOB: 12/05/1945  Admit date: 11/10/2018 Date of Consult: 11/11/2018  Primary Care Provider: [name], MD Primary Cardiologist: Jenkins Rouge, MD  Primary Electrophysiologist:  None    Patient Profile:   Hannah [name] is a 73 y.o. female, here for 3rd admission in the last month with a hx of recent GIB and anemia requiring transfusion, syncope of unknown etiology, chronic diastolic HF with recurrent pulmonary edema, HTN, HLD, GERD, anxiety and asthma  who is being seen today for the evaluation of acute on chronic diastolic CHF at the request of Dr. Andria Frames, [facility].   History of Present Illness:   This is Hannah [name]'s 3rd hospital admission in the last month. Hannah [name] was admitted 7/9 for CP and dyspnea and found to be anemic w/ hgb of 6.9 and was transfused with 1 unit of RBCs. Hannah [name] underwent GI w/u and was found to have an acute GIB. Per discharge summary, Hannah [name] had an endoscopy and colonoscopy on 01/25/19 which showed a single 7 mm sessile polypoid lesion with bleeding was found in the duodenal bulb with fresh blood oozing from the center. A single angioectasia without bleeding was found in the duodenal bulb. Colonoscopy showed diverticulosis in the sigmoid colon. A 10 mm polypoid lesion was found in the proximal sigmoid colon-biopsies done. Two 6-7 mm sessile polyps were found in the proximal ascending colon; polyps were removed. Biopsies were pending on these results. 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 IR was consulted for the focal ectasia of the distal trifurcation of the GDA with bleeding intraluminal polyp on endoscopy. Hannah [name] had a successful mesenteric arteriogram and percutaneous coil embolization. During that admission, Hannah [name] also later developed acute pulmonary edema, secondary to fluid  administration. BNP was 253. Hannah [name] was treated w/ IV Lasix. 2D echo was obtained and showed normal LVEF at 60-65% w/ mild-mod MR. Hannah [name] improved w/ diuretics. Her dyspnea resolved and O2 sats normalized. Hannah [name] was not sent home on PO diuretics. Hgb was 7.5 day of discharge.   Hannah [name] was readmitted again on 7/19 for syncope and dyspnea. BNP was higher than previous admit, up from 253>>358.  CXR showed slight increase in pulmonary edema from prior exam. Slight increase in pulmonary edema from prior exam with Kerley B-lines and small pleural effusions. Hannah [name] was given additional IV Lasix. Notes from that admission outlined that Hannah [name] had limited recollection of the events regarding her syncopal episode but had denied any recent CP or preceding palpitations. Her EKG was normal. Troponins were flat and not significant 17>>40>>33. D-dimer was elevated at 3.41 but CTA of chest was negative for PE. Hannah [name] was seen in consultation by Dr. Johnsie Cancel. He did not feel that her syncope was cardiac in nature. Her anemia was felt to be a possible contributing factor. No further inpatient cardiac w/u was recommended. Plans were to have pt f/u in outpatient cardiology clinic and consider outpatient stress test. No diuretic was prescribed on d/c. Hannah [name] was discharged on 7/20 and readmitted today.   Hannah [name] presented back to the ED late last night w/ recurrent dyspnea. Hannah [name] arrived by [facility]. Was found to be hypoxic, O2 sats 74% on RA. Also tachypnea 40 breaths/ min. Normotensive. Hannah [name] was placed on CPAP but did not tolerate this and it was removed after only about 5 minutes. Hannah [name] was then placed on a nonrebreather at 15 L/min.  Hannah [name] also  received 0.6 mg of epi by EMS. BNP higher than previous, up from 358>>427. CXR showed progressive pulmonary edema and bilateral pleural effusions (unchanged from previous). SCr 1.27. K 4.2, Hgb 8.8. Hannah [name] was given a dose of IV Lasix 40 mg in the ED and placed on IV nitro.   Hannah [name] is feeling better today. Appears comfortable.  Denies dyspnea. No CP or palpitations. No recurrent syncope/ near syncope. Hannah [name] reports her dyspnea last night came on suddenly. Felt fine during the morning and afternoon.   We discussed diet. Hannah [name] cooks most of her meals at home. Hannah [name] tries to get fresh vegetables from the farmer's market. Hannah [name] told me that Hannah [name] does not each much canned foods and does not add salt to diet but does not pay attention to food labels. Does not eat out much. Hannah [name] has a scale at home but does not weigh often.    Heart Pathway Score:     Past Medical History:  Diagnosis Date   Anemia    Anxiety    Arthritis    "hands" (05/09/2015)   GERD (gastroesophageal reflux disease)    High cholesterol    History of blood transfusion 1960s; 05/08/2015   "when I had my kids; low HgB"   Hypertension    Osteoarthritis    Pneumonia 05/2004    Past Surgical History:  Procedure Laterality Date   BIOPSY  10/28/2018   Procedure: BIOPSY;  Surgeon: Juanita Craver, MD;  Location: [facility];  Service: Endoscopy;;   CARDIAC CATHETERIZATION  05/2004   COLONOSCOPY  07/2002   Archie Endo 09/02/2010   COLONOSCOPY WITH PROPOFOL N/A 10/28/2018   Procedure: COLONOSCOPY WITH PROPOFOL;  Surgeon: Juanita Craver, MD;  Location: [facility] ENDOSCOPY;  Service: Endoscopy;  Laterality: N/A;   ESOPHAGOGASTRODUODENOSCOPY  07/2002   w/biopsies/notes 09/02/2010   ESOPHAGOGASTRODUODENOSCOPY (EGD) WITH PROPOFOL N/A 10/28/2018   Procedure: ESOPHAGOGASTRODUODENOSCOPY (EGD) WITH PROPOFOL;  Surgeon: Juanita Craver, MD;  Location: [facility] ENDOSCOPY;  Service: Endoscopy;  Laterality: N/A;   HOT HEMOSTASIS N/A 10/28/2018   Procedure: HOT HEMOSTASIS (ARGON PLASMA COAGULATION/BICAP);  Surgeon: Juanita Craver, MD;  Location: [facility] ENDOSCOPY;  Service: Endoscopy;  Laterality: N/A;   IR ANGIOGRAM SELECTIVE EACH ADDITIONAL VESSEL  10/30/2018   IR ANGIOGRAM SELECTIVE EACH ADDITIONAL VESSEL  10/30/2018   IR ANGIOGRAM SELECTIVE EACH ADDITIONAL VESSEL  10/30/2018   IR ANGIOGRAM  SELECTIVE EACH ADDITIONAL VESSEL  10/30/2018   IR ANGIOGRAM VISCERAL SELECTIVE  10/30/2018   IR EMBO ART  VEN HEMORR LYMPH EXTRAV  INC GUIDE ROADMAPPING  10/30/2018   IR US GUIDANCE  10/30/2018   POLYPECTOMY  10/28/2018   Procedure: POLYPECTOMY;  Surgeon: Juanita Craver, MD;  Location: [facility] ENDOSCOPY;  Service: Endoscopy;;     Home Medications:  Prior to Admission medications   Medication Sig Start Date End Date Taking? Authorizing Provider  acetaminophen (TYLENOL) 325 MG tablet Take 650 mg by mouth every 6 (six) hours as needed for mild pain or headache.    [provider]  ALPRAZolam (XANAX) 0.25 MG tablet TAKE 1 TABLET (0.25 MG TOTAL) BY MOUTH AT BEDTIME AS NEEDED. Patient taking differently: Take 0.25 mg by mouth at bedtime as needed for anxiety or sleep.  10/14/18   [name], MD  atorvastatin (LIPITOR) 40 MG tablet Take 1 tablet (40 mg total) by mouth daily. 05/31/18   [name], MD  carvedilol (COREG) 12.5 MG tablet Take 1 tablet (12.5 mg total) by mouth 2 (two) times daily with a meal. 11/09/17   Chambliss, Ruthann Cancer  L, MD  feeding supplement, ENSURE ENLIVE, (ENSURE ENLIVE) LIQD Take 237 mLs by mouth 2 (two) times daily between meals. 11/08/18   Lurline Del, MD  ferrous sulfate 325 (65 FE) MG tablet Take 325 mg by mouth daily with breakfast.    [provider]  omeprazole (PRILOSEC) 20 MG capsule TAKE 1 CAPSULE BY MOUTH DAILY AS NEEDED Patient taking differently: Take 20 mg by mouth as needed.  08/09/18   [name], MD  sertraline (ZOLOFT) 50 MG tablet TAKE 1/2 TABLET BY MOUTH DAILY Patient taking differently: Take 50 mg by mouth daily.  09/13/18   [name], MD    Inpatient Medications: Scheduled Meds:  atorvastatin  40 mg Oral Daily   feeding supplement (ENSURE ENLIVE)  237 mL Oral BID BM   ferrous sulfate  325 mg Oral Q breakfast   sertraline  25 mg Oral Daily   Continuous Infusions:  nitroGLYCERIN 5 mcg/min  (11/11/18 0330)   PRN Meds: acetaminophen **OR** acetaminophen, polyethylene glycol  Allergies:   No Known Allergies  Social History:   Social History   Socioeconomic History   Marital status: Widowed    Spouse name: Chrissie Noa   Number of children: 4   Years of education: 12   Highest education level: High school graduate  Occupational History   Occupation: Retired     Comment: Environmental consultant strain: Not hard at [employer] insecurity    Worry: Never true    Inability: Never true   Transportation needs    Medical: No    Non-medical: No  Tobacco Use   Smoking status: Former Smoker    Packs/day: 0.30    Years: 8.00    Pack years: 2.40    Types: Cigarettes    Quit date: 05/30/2004    Years since quitting: 14.4   Smokeless tobacco: Never Used  Substance and Sexual Activity   Alcohol use: No    Alcohol/week: 0.0 standard drinks   Drug use: Never   Sexual activity: Not Currently  Lifestyle   Physical activity    Days per week: 0 days    Minutes per session: 0 min   Stress: Not at all  Relationships   Social connections    Talks on phone: More than three times a week    Gets together: More than three times a week    Attends religious service: More than 4 times per year    Active member of club or organization: Yes    Attends meetings of clubs or organizations: Never    Relationship status: Widowed   Intimate partner violence    Fear of current or ex partner: No    Emotionally abused: No    Physically abused: No    Forced sexual activity: No  Other Topics Concern   Not on file  Social History Narrative   Emergency Contact: [name] [phone]   End of Life Plan: gave pt ad pamplet   Any pets: 1 Dog    Diet: pt has a varied diet, low consumption of carbs and fatty foods    Exercise: pt does not have a regular exercise routine    Seatbelts: pt wears seat regularly in car   Hobbies: spending time with grandchildren       *Updated 09/26/2018*   Patients daughter [name] lives with her and assists her in driving to doctors apts and errands. Patient does have a renewed drivers licenses, but feels more  comfortable riding with her daughter. Patient enjoys going out to eat and spending time with her grandchildren who live in [place]. Patient stated they come during the summer to see her. Patient is excited her son, Vicente Males, is about to finish school to become a Theme park manager.    The patient has great family support system.     Family History:    Family History  Problem Relation Age of Onset   Heart disease Mother        died at age 37   Breast cancer Sister    Cancer Sister        Breast   Hypertension Father    Hypertension Sister      ROS:  Please see the history of present illness.   All other ROS reviewed and negative.     Physical Exam/Data:   Vitals:   11/11/18 0144 11/11/18 0322 11/11/18 0430 11/11/18 0500  BP:  (!) 134/94 118/63 (!) 126/52  Pulse:  99    Resp:  16 (!) 22 (!) 22  Temp: 98.3 F (36.8 C) 97.7 F (36.5 C)    TempSrc: Oral Oral    SpO2:  97%    Weight: 76 kg     Height: 5\' 2"  (1.575 m)       Intake/Output Summary (Last 24 hours) at 11/11/2018 1125 Last data filed at 11/11/2018 1023 Gross per 24 hour  Intake 360 ml  Output 250 ml  Net 110 ml   Last 3 Weights 11/11/2018 11/08/2018 11/07/2018  Weight (lbs) 167 lb 8.8 oz 165 lb 166 lb 11.2 oz  Weight (kg) 76 kg 74.844 kg 75.615 kg     Body mass index is 30.65 kg/m.  General:  Pleasant, elderly WF, Well nourished, well developed, in no acute distress HEENT: normal Lymph: no adenopathy Neck: no JVD Endocrine:  No thryomegaly Vascular: No carotid bruits; FA pulses 2+ bilaterally without bruits  Cardiac:  normal S1, S2; RRR; no murmur  Lungs:  clear to auscultation bilaterally, no wheezing, rhonchi or rales  Abd: soft, nontender, no hepatomegaly  Ext: no edema Musculoskeletal:  No deformities, BUE and BLE strength normal  and equal Skin: warm and dry  Neuro:  CNs 2-12 intact, no focal abnormalities noted Psych:  Normal affect   EKG:  The EKG was personally reviewed and demonstrates:  NSR 84 bpm w/ BBB Telemetry:  Telemetry was personally reviewed and demonstrates:  Sinus tach 100 bpm  Relevant CV Studies: 2D Echo 10/31/18 IMPRESSIONS    1. The left ventricle has normal systolic function with an ejection fraction of 60-65%. The cavity size was normal. Left ventricular diastolic Doppler parameters are consistent with pseudonormalization.  2. The right ventricle has normal systolic function. The cavity was normal. There is no increase in right ventricular wall thickness.  3. Left atrial size was moderately dilated.  4. Mild thickening of the mitral valve leaflet. Mitral valve regurgitation is mild to moderate by color flow Doppler.  5. MR not seen well on TTE. Appears mild to moderate. Consider TEE if more information needed.  6. The tricuspid valve is grossly normal.  7. The aortic valve is tricuspid. Mild calcification of the aortic valve.  8. The inferior vena cava was dilated in size with >50% respiratory variability.  9. The interatrial septum was not well visualized.  Laboratory Data:  High Sensitivity Troponin:   Recent Labs  Lab 11/06/18 0419 11/06/18 1113 11/10/18 2050 11/11/18 0047 11/11/18 0337  TROPONINIHS 40* 33* 17 59* 53*  Cardiac EnzymesNo results for input(s): TROPONINI in the last 168 hours. No results for input(s): TROPIPOC in the last 168 hours.  Chemistry Recent Labs  Lab 11/07/18 0446 11/10/18 2050 11/10/18 2103 11/11/18 0337  NA 140 135 138 138  K 4.0 3.8 3.8 4.2  CL 105 102  --  103  CO2 25 17*  --  22  GLUCOSE 119* 292*  --  123*  BUN 42* 23  --  23  CREATININE 1.56* 1.34*  --  1.27*  CALCIUM 8.5* 8.5*  --  8.6*  GFRNONAA 32* 39*  --  41*  GFRAA 37* 45*  --  48*  ANIONGAP 10 16*  --  13    Recent Labs  Lab 11/07/18 0446 11/10/18 2050 11/11/18 0337   PROT 6.1* 7.1 6.8  ALBUMIN 3.1* 3.4* 3.3*  AST 18 27 21   ALT 11 13 13   ALKPHOS 80 113 75  BILITOT 0.4 0.3 0.5   Hematology Recent Labs  Lab 11/08/18 0438 11/10/18 2050 11/10/18 2103 11/11/18 0337  WBC 8.0 15.6*  --  12.5*  RBC 3.00* 3.98  --  3.25*  HGB 8.1* 10.4* 12.6 8.8*  HCT 26.5* 37.6 37.0 29.7*  MCV 88.3 94.5  --  91.4  MCH 27.0 26.1  --  27.1  MCHC 30.6 27.7*  --  29.6*  RDW 16.0* 15.9*  --  15.9*  PLT 336 562*  --  395   BNP Recent Labs  Lab 11/06/18 0157 11/10/18 2050  BNP 358.1* 427.4*    DDimer  Recent Labs  Lab 11/06/18 1510  DDIMER 3.41*     Radiology/Studies:  Dg Chest 1 View  Result Date: 11/10/2018 CLINICAL DATA:  Acute shortness of breath today. Decreased oxygen saturation. EXAM: CHEST  1 VIEW COMPARISON:  Radiographs and CT 11/06/2018 FINDINGS: Progressive pulmonary edema, now moderate in degree. Bilateral pleural effusions are similar. Minimal fluid in the right minor fissure. Slight increased heart size since prior exam. Aortic arch atherosclerosis. No pneumothorax. IMPRESSION: Progressive pulmonary edema, now moderate in degree. Small bilateral pleural effusions. Electronically Signed   By: Keith Rake M.D.   On: 11/10/2018 21:25    Assessment and Plan:   LODIE WAHEED is a 73 y.o. female, here for 3rd admission in the last month with a hx of recent GIB and anemia requiring transfusion, syncope of unknown etiology, chronic diastolic HF with recurrent pulmonary edema, HTN, HLD, GERD, anxiety, asthma  who is being seen today for the evaluation of acute on chronic diastolic CHF at the request of Dr. Andria Frames, [facility].    1. Acute on Chronic Diastolic CHF/ Acute Pulmonary Edema: this is her 3rd episode of acute pulmonary edema in the last month. The first occurrence was during her first admission for GIB, and felt to be precipitated by IVF administration. BNP was 253. Echo showed normal LVEF. Hannah [name] responded well to IV Lasix but was  not discharged home on a standing dose of diuretic. 2nd occurrence was last week. Acute onset of dyspnea w/ CXR showing increase in pulmonary edema from prior exam with Kerley B-lines and small pleural effusions. BNP had increased to 358.1 and treated w/ IV Lasix. Good response but no order for home diuretic on discharge. Back again for 3rd admit, w/ acute pulmonary edema. Sudden onset dyspnea at home, Hypoxic w/ O2 sats in the 70s requiring nonrebreathing. CXR with progressive pulmonary edema and stable bilateral pleural effusions. BNP further increased, now at 427.4. Hannah [name] has been normotensive. Scr  ok at 1.2. K 4.2. Sodium 138. Hannah [name] got 40 of IV Lasix in the ED last night. Only 250 cc in UOP reordered. ? If strict I/Os being recorded by nursing staff. Will need to tract I/Os closely and trend daily weights. At this point, Hannah [name] has proven that Hannah [name] needs to be on a standing dose of PO diuretic once discharged. Recommend Lasix 40 mg daily, along with daily weights to monitor volume status at home and sliding scale dose adjustments if > 3 lb weigh gain in 24 hr or > 5 lb wt gain in 1 week, to avoid readmission. Can obtain f/u outpatient BMP in 7 days.  BP control and sodium restriction also important. Given her diastolic dysfunction, Hannah [name] may also benefit from  blocker therapy. HR range 98-128. SBP 94-126. Consider low dose metoprolol.    2. Anemia: recent GIB 7/9. Treated w/ transfusion. EGD and colonoscopy findings/ intervention outlined in HPI above. Hgb 8.8 today. Hannah [name] is on ferrous sulfate. Further management per primary team.    3. Renal Insuffiencey: SCr 1.2 today. Baseline ~1.0. Recommend standing dose of PO diuretic at time of discharge, given recurrent acute dCHF exacerbations requiring IV diuretics. Recommend Lasix 40 PO daily w/ f/u BMP 7 days post discharge.   4. Syncope: had syncopal spell about a week ago. W/u unremarkable. Was evaluate by cardiology previous admit and syncope not felt to be cardiac.  Possibly related to her anemia. Hannah [name] denies any further recurrence. No significant arrhthymias noted on tele. No indication for inpatient ischemic w/u. Plan to f/u with Dr. Johnsie Cancel. If recurrence, can consider outpatient monitor and outpatient stress test.    MD to follow with further recommendations.   For questions or updates, please contact [place] Please consult [email or web address] for contact info under     Signed, Lyda Jester, PA-C  11/11/2018 11:25 AM

## 2018-11-11 NOTE — Discharge Summary (Addendum)
[facility] Discharge Summary  [name] Medical record number: [id number] Date of birth: Mar 25, 1946 Age: 73 y.o. Gender: female Date of Admission: 11/10/2018  Date of Discharge: 11/12/2018 Admitting Physician: Zenia Resides, MD  Primary Care Provider: [name], MD Consultants: Cardiology  Indication for Hospitalization: Shortness of breath  Discharge Diagnoses/Problem List:  Previous GI bleed Symptomatic anaemia HTN Gout GERD Anxiety  CKD stage IIIb HLD Dementia  Severe protein calorie malnutrition   Disposition: Home   Discharge Condition: Stable  Discharge Exam:  General: Alert and oriented in no apparent distress Heart: Regular rate and rhythm with no murmurs appreciated Lungs: CTA bilaterally, no wheezing, breathing well on room air Abdomen: Bowel sounds present, no abdominal pain Skin: Warm and dry Extremities: Trace lower leg edema  Brief Hospital Course:  Patient is a 73yo female who presented with acute dyspnea after recently being discharged for similar complaint on 7/21. Her daughter reported that she woke up on the morning of admission "screamnig" with shortness of breath and being drenched in sweat. With EMS her sats were 80% on room air. She was put on a no-reberate mask with oxygen at 15LUpon presentation to the ED her O2 sats were 80%. In the ER her her labs showed: Na 135, K 3.61m Cr 1.34, BUN 23, Hb 12.6, BNP 427 and Trop 17. CXR showed pulmonary edema with small bilateral pleural effusions. She was give 1 X 40mg  IV furosemide and 50mg  IV nitroglycerin gtt. after which her condition rapidly improved. Cardiology evaluated the patient and determined she would be a candidate for outpatient follow-up with them regarding her recurrent acute diastolic heart failure. They also recommended a daily Lasix 40mg  as well as changing her carvedilol to metoprolol 12.5 BID. Patient was scheduled for a CTA on 7/24  but refused the scan and asked to be discharged. On day of discharge, patient was comfortable on room air and had stable weight with good UOP.   Anemia- prior GI bleed repaired on previous admission. Patient hgb remained stable this admission without transfusion. Hgb 8.4 on day of discharge. Continued PO iron daily.   Issues for Follow Up:  1.f/u with Cardiology on daily lasix 40 mg, changed carvedilol to metoprolol 12.5 mg BID to prevent too much bradycardia (given her diastolic dysfunction) and to allow for more BP room for diuretic. 2.  Cardiology suggests that if her renal function and BP allow as outpatient, they would consider adding spironolactone. 3. Cardiology had discussed sending patient a ziopatch outpatient to monitor for arhythmia.  4. PCP follow up on outpatient cardiology workup.  5. Would consider discontinuing xanax and changing zoloft to SNRI. (BEERs list medications) 6. Would consider changing PPI to histamine blocker in future if not GI bleed concern 7. Recommend palliative consult   Significant Procedures: none  Significant Labs and Imaging:  Recent Labs  Lab 11/11/18 0337 11/11/18 1434 11/12/18 0313  WBC 12.5* 9.2 8.4  HGB 8.8* 8.6* 8.4*  HCT 29.7* 28.4* 28.4*  PLT 395 349 354   Recent Labs  Lab 11/06/18 0157 11/06/18 0433 11/07/18 0446 11/10/18 2050 11/10/18 2103 11/11/18 0337 11/12/18 0313  NA 137  --  140 135 138 138 138  K 4.8  --  4.0 3.8 3.8 4.2 4.3  CL 103  --  105 102  --  103 103  CO2 19*  --  25 17*  --  22 25  GLUCOSE 257*  --  119* 292*  --  123* 114*  BUN 28*  --  42* 23  --  23 25*  CREATININE 1.50*  --  1.56* 1.34*  --  1.27* 1.21*  CALCIUM 8.3*  --  8.5* 8.5*  --  8.6* 9.1  MG  --  1.7  --   --   --  1.7  --   ALKPHOS  --   --  80 113  --  75  --   AST  --   --  18 27  --  21  --   ALT  --   --  11 13  --  13  --   ALBUMIN  --   --  3.1* 3.4*  --  3.3*  --      Results/Tests Pending at Time of Discharge: none  Discharge  Medications:  Allergies as of 11/12/2018   No Known Allergies     Medication List    STOP taking these medications   carvedilol 12.5 MG tablet Commonly known as: COREG     TAKE these medications   acetaminophen 325 MG tablet Commonly known as: TYLENOL Take 650 mg by mouth every 6 (six) hours as needed for mild pain or headache.   ALPRAZolam 0.25 MG tablet Commonly known as: XANAX TAKE 1 TABLET (0.25 MG TOTAL) BY MOUTH AT BEDTIME AS NEEDED. What changed: reasons to take this   atorvastatin 40 MG tablet Commonly known as: LIPITOR Take 1 tablet (40 mg total) by mouth daily.   feeding supplement (ENSURE ENLIVE) Liqd Take 237 mLs by mouth 2 (two) times daily between meals.   ferrous sulfate 325 (65 FE) MG tablet Take 325 mg by mouth daily with breakfast.   furosemide 40 MG tablet Commonly known as: LASIX Take 1 tablet (40 mg total) by mouth daily. Start taking on: November 13, 2018   metoprolol tartrate 25 MG tablet Commonly known as: LOPRESSOR Take 0.5 tablets (12.5 mg total) by mouth 2 (two) times daily.   omeprazole 20 MG capsule Commonly known as: PRILOSEC TAKE 1 CAPSULE BY MOUTH DAILY AS NEEDED What changed:   how much to take  how to take this  when to take this  reasons to take this  additional instructions   sertraline 50 MG tablet Commonly known as: ZOLOFT TAKE 1/2 TABLET BY MOUTH DAILY What changed: how much to take       Discharge Instructions: Please refer to Patient Instructions section of EMR for full details.  Patient was counseled important signs and symptoms that should prompt return to medical care, changes in medications, dietary instructions, activity restrictions, and follow up appointments.   Follow-Up Appointments: Follow-up Information    [name], MD Follow up.   Specialty: Cardiology Why: our office will call you with a hospital follow up in 1-2 weeks  Contact information: [address] [place]  [address] [phone]           Richarda Osmond, DO 11/12/2018, 2:27 PM PGY-2, [place]

## 2018-11-11 NOTE — Evaluation (Signed)
Physical Therapy Evaluation Patient Details Name: Hannah [name] MRN: [id number] DOB: 12-13-1945 Today's Date: 11/11/2018   History of Present Illness  Patient is a 73 y/o female who presents with recurrent SOB. CXR showed progressive pulmonary edema and bilateral pleural effusions. BNP 358. 2 recent admissions 7/9 and 7/19. PMH includes dementia, HTN, anxiety, high cholesterol.  Clinical Impression  Patient presents with cognitive deficits, weakness and mild balance deficits s/p above. Per chart, pt has hx of progressive intermittent dementia which waxes and wanes, with some reports of sundowning while in the hospital. During this evaluation, pt A&Ox2, not aware of why she was in the hospital or the date. Pt tolerated transfers and gait training with Min guard-supervision for safety. No evidence of imbalance, reaching for counter for support as needed. Sp02 remained >92% on RA, no evidence of SOB. Reports some subjective weakness. Pt lives with daughter and per chart is independent PTA. Will follow acutely to maximize independence and mobility prior to return home.    Follow Up Recommendations No PT follow up;Supervision - Intermittent    Equipment Recommendations  None recommended by PT    Recommendations for Other Services       Precautions / Restrictions Precautions Precautions: Fall Restrictions Weight Bearing Restrictions: No      Mobility  Bed Mobility               General bed mobility comments: Sitting EOB upon PT arrival.  Transfers Overall transfer level: Modified independent Equipment used: None             General transfer comment: Stood from EOB x1, no issues or LOB.  Ambulation/Gait Ambulation/Gait assistance: Supervision Gait Distance (Feet): 150 Feet Assistive device: None Gait Pattern/deviations: Step-through pattern;Decreased stride length Gait velocity: decreased   General Gait Details: Slow, steady gait, no evidence of imbalance. Sp02  remained >92% on RA throughout.  Stairs            Wheelchair Mobility    Modified Rankin (Stroke Patients Only)       Balance Overall balance assessment: Mild deficits observed, not formally tested                                           Pertinent Vitals/Pain Pain Assessment: Faces Faces Pain Scale: No hurt    Home Living Family/patient expects to be discharged to:: Private residence Living Arrangements: Children Available Help at Discharge: Family Type of Home: House Home Access: Stairs to enter Entrance Stairs-Rails: Right Entrance Stairs-Number of Steps: 2 Home Layout: One level Home Equipment: Walker - 2 wheels;Cane - single point;Wheelchair - manual      Prior Function Level of Independence: Independent         Comments: Per chat, pt independent with ADLs and some IADLs. Daughter does all the driving.     Hand Dominance        Extremity/Trunk Assessment   Upper Extremity Assessment Upper Extremity Assessment: Defer to OT evaluation    Lower Extremity Assessment Lower Extremity Assessment: Generalized weakness    Cervical / Trunk Assessment Cervical / Trunk Assessment: Kyphotic  Communication   Communication: No difficulties  Cognition Arousal/Alertness: Awake/alert Behavior During Therapy: WFL for tasks assessed/performed Overall Cognitive Status: No family/caregiver present to determine baseline cognitive functioning  General Comments: Pt has no clue why she is in the hospital; "i wanted to come see these people." Thinks it is August. Perseverating on wanting to go home; able to be redirected.      General Comments General comments (skin integrity, edema, etc.): VSS throughout.    Exercises     Assessment/Plan    PT Assessment Patient needs continued PT services  PT Problem List Decreased strength;Decreased mobility;Decreased safety awareness;Decreased  balance;Decreased cognition       PT Treatment Interventions Therapeutic activities;Cognitive remediation;Gait training;Therapeutic exercise;Patient/family education;Balance training;Stair training;Functional mobility training    PT Goals (Current goals can be found in the Care Plan section)  Acute Rehab PT Goals Patient Stated Goal: to go home now PT Goal Formulation: With patient Time For Goal Achievement: 11/25/18 Potential to Achieve Goals: Good    Frequency Min 3X/week   Barriers to discharge        Co-evaluation               AM-PAC PT "6 Clicks" Mobility  Outcome Measure Help needed turning from your back to your side while in a flat bed without using bedrails?: A Little Help needed moving from lying on your back to sitting on the side of a flat bed without using bedrails?: A Little Help needed moving to and from a bed to a chair (including a wheelchair)?: None Help needed standing up from a chair using your arms (e.g., wheelchair or bedside chair)?: None Help needed to walk in hospital room?: A Little Help needed climbing 3-5 steps with a railing? : A Little 6 Click Score: 20    End of Session Equipment Utilized During Treatment: Gait belt Activity Tolerance: Patient tolerated treatment well Patient left: in bed;with call bell/phone within reach;with nursing/sitter in room(sitting EOB.) Nurse Communication: Mobility status PT Visit Diagnosis: Unsteadiness on feet (R26.81);Muscle weakness (generalized) (M62.81)    Time: 1448-1856 PT Time Calculation (min) (ACUTE ONLY): 14 min   Charges:   PT Evaluation $PT Eval Moderate Complexity: 1 Mod          Wray Kearns, PT, DPT Acute Rehabilitation Services Pager [phone] Office [phone]      [name] 11/11/2018, 2:40 PM

## 2018-11-11 NOTE — Progress Notes (Signed)
Received page from RN that patient was agitated.  She was not listening she was disoriented and was attempted to physically the floor.  Dr. Edwina Barth went up to evaluate and attempted to connect the patient with family on the phone which was unsuccessful at calming her down.  We called the family to offer them permission to come sit with her to attempt to orient but no one was available, we discussed that due to lack of sitters and risk to patient safety we would need sedate her.  -Dr. Criss Rosales

## 2018-11-12 DIAGNOSIS — R0602 Shortness of breath: Secondary | ICD-10-CM

## 2018-11-12 LAB — BASIC METABOLIC PANEL
Anion gap: 10 (ref 5–15)
BUN: 25 mg/dL — ABNORMAL HIGH (ref 8–23)
CO2: 25 mmol/L (ref 22–32)
Calcium: 9.1 mg/dL (ref 8.9–10.3)
Chloride: 103 mmol/L (ref 98–111)
Creatinine, Ser: 1.21 mg/dL — ABNORMAL HIGH (ref 0.44–1.00)
GFR calc Af Amer: 51 mL/min — ABNORMAL LOW (ref >60)
GFR calc non Af Amer: 44 mL/min — ABNORMAL LOW (ref >60)
Glucose, Bld: 114 mg/dL — ABNORMAL HIGH (ref 70–99)
Potassium: 4.3 mmol/L (ref 3.5–5.1)
Sodium: 138 mmol/L (ref 135–145)

## 2018-11-12 LAB — URINE CULTURE: Culture: <10000 — AB

## 2018-11-12 LAB — CBC
HCT: 28.4 % — ABNORMAL LOW (ref 36.0–46.0)
Hemoglobin: 8.4 g/dL — ABNORMAL LOW (ref 12.0–15.0)
MCH: 26.5 pg (ref 26.0–34.0)
MCHC: 29.6 g/dL — ABNORMAL LOW (ref 30.0–36.0)
MCV: 89.6 fL (ref 80.0–100.0)
Platelets: 354 10*3/uL (ref 150–400)
RBC: 3.17 MIL/uL — ABNORMAL LOW (ref 3.87–5.11)
RDW: 16 % — ABNORMAL HIGH (ref 11.5–15.5)
WBC: 8.4 10*3/uL (ref 4.0–10.5)
nRBC: 0 % (ref 0.0–0.2)

## 2018-11-12 MED ORDER — METOPROLOL TARTRATE 25 MG PO TABS: 12.5000 mg | ORAL_TABLET | Freq: Two times a day (BID) | ORAL | 0 refills | Status: DC

## 2018-11-12 MED ORDER — FUROSEMIDE 40 MG PO TABS: 40.0000 mg | ORAL_TABLET | Freq: Every day | ORAL | 0 refills | Status: DC

## 2018-11-12 NOTE — Discharge Instructions (Signed)
-   You were admitted for shortness of breath and possible syncope. - Upon requesting CT angiogram to rule out a blood clot in your lungs you requested to not have the test and to discharge home - Cardiology recommends you follow up with them outpatient for further workup.  - Cardiology recommends taking a daily lasix 40 mg. They also recommend changing your carvedilol to metoprolol 12.5 mg twice per day. - cardiology is planning to send you a ziopatch to monitor your heart. Please follow up with them if you do not receive this device.  - Please follow up with your PCP within one week for recheck, including new medications such as your blood pressure medicine - Please return if you experience similar symptoms, high fevers, confusion, chest pain that does not subside within 15 minutes or shortness of breath.

## 2018-11-12 NOTE — Progress Notes (Signed)
Patient has refused to wear telemetry monitoring most of the day and all night. Multiple attempts to educate and redirect unsucessful. Patient has remained oriented to name but confused to place, time and situation. Patient has to be reoriented to surroundings frequently. While talking on the phone with daughter patient becomes increasingly agitated. Noted calming effect, less agitated and cooperative when given haldol po with ice cream. Slept in longer intervals, continue to state she was leaving as soon as her daughter arrived. Paced the room and wandered in hallways. MD text paged of noncompliance of telemetry monitoring requesting order to d/c if not necessary.

## 2018-11-12 NOTE — Clinical Social Work Note (Signed)
CSW acknowledges SNF consult. PT evaluated yesterday and recommended no follow up. CSW signing off. Consult again if her ambulatory status declines.  Dayton Scrape, [place]

## 2018-11-12 NOTE — Evaluation (Signed)
Occupational Therapy Evaluation Patient Details Name: Hannah [name] MRN: [id number] DOB: [birth year] Today's Date: 11/12/2018    History of Present Illness Patient is a 73 y/o female who presents with recurrent SOB. CXR showed progressive pulmonary edema and bilateral pleural effusions. BNP 358. 2 recent admissions 7/9 and 7/19. PMH includes dementia, HTN, anxiety, high cholesterol.   Clinical Impression   Pt seen for OT evaluation. She was somewhat confused with decreased awareness, memory, and judgement skills noted and is likely a poor historian. Pt currently requiring overall supervision for safety during ADL and mobility tasks. She demonstrates mild balance deficits but was safe overall while ambulating. She will need 24 hour assistance/supervision at home due to cognitive deficits and difficulty with IADL tasks. OT will continue to follow acutely but no follow-up recommended.     Follow Up Recommendations  Supervision/Assistance - 24 hour    Equipment Recommendations  None recommended by OT    Recommendations for Other Services       Precautions / Restrictions Precautions Precautions: Fall Restrictions Weight Bearing Restrictions: No      Mobility Bed Mobility               General bed mobility comments: OOB in bathroom on my arrival.   Transfers Overall transfer level: Modified independent Equipment used: None                  Balance Overall balance assessment: Mild deficits observed, not formally tested                                         ADL either performed or assessed with clinical judgement   ADL Overall ADL's : Needs assistance/impaired                                       General ADL Comments: Overall supervision for safety.      Vision Patient Visual Report: No change from baseline Vision Assessment?: No apparent visual deficits     Perception     Praxis      Pertinent Vitals/Pain Pain  Assessment: Faces Faces Pain Scale: No hurt     Hand Dominance     Extremity/Trunk Assessment Upper Extremity Assessment Upper Extremity Assessment: Generalized weakness   Lower Extremity Assessment Lower Extremity Assessment: Generalized weakness   Cervical / Trunk Assessment Cervical / Trunk Assessment: Kyphotic   Communication Communication Communication: No difficulties   Cognition Arousal/Alertness: Awake/alert Behavior During Therapy: WFL for tasks assessed/performed Overall Cognitive Status: History of cognitive impairments - at baseline                                 General Comments: Pt unaware of why she is in hospital. Poor awareness of safety. Poor memory. Per chart, history of dementia that waxes and wanes.    General Comments       Exercises     Shoulder Instructions      Home Living Family/patient expects to be discharged to:: Private residence Living Arrangements: Children Available Help at Discharge: Family Type of Home: House Home Access: Stairs to enter Technical brewer of Steps: 2 Entrance Stairs-Rails: Right Home Layout: One level     Bathroom Shower/Tub: Occupational psychologist:  Standard     Home Equipment: Walker - 2 wheels;Cane - single point;Wheelchair - manual          Prior Functioning/Environment Level of Independence: Independent        Comments: Per chart, pt independent with ADLs and some IADLs. Daughter does all the driving.        OT Problem List: Impaired balance (sitting and/or standing);Decreased activity tolerance;Decreased cognition;Decreased safety awareness      OT Treatment/Interventions: Self-care/ADL training;Therapeutic exercise;Energy conservation;DME and/or AE instruction;Therapeutic activities;Patient/family education;Balance training;Cognitive remediation/compensation    OT Goals(Current goals can be found in the care plan section) Acute Rehab OT Goals Patient Stated  Goal: to go home now OT Goal Formulation: With patient Time For Goal Achievement: 11/26/18 Potential to Achieve Goals: Good ADL Goals Pt Will Transfer to Toilet: Independently;ambulating;regular height toilet Pt Will Perform Toileting - Clothing Manipulation and hygiene: Independently;sit to/from stand Additional ADL Goal #1: Pt will complete multi-step grooming task independently (without verbal cues for safety) in a moderately distracting environment.  OT Frequency: Min 2X/week   Barriers to D/C:            Co-evaluation              AM-PAC OT "6 Clicks" Daily Activity     Outcome Measure Help from another person eating meals?: A Little Help from another person taking care of personal grooming?: A Little Help from another person toileting, which includes using toliet, bedpan, or urinal?: A Little Help from another person bathing (including washing, rinsing, drying)?: A Little Help from another person to put on and taking off regular upper body clothing?: A Little Help from another person to put on and taking off regular lower body clothing?: A Little 6 Click Score: 18   End of Session Nurse Communication: Mobility status  Activity Tolerance: Patient tolerated treatment well Patient left: in bed;with bed alarm set;with call bell/phone within reach  OT Visit Diagnosis: Other abnormalities of gait and mobility (R26.89);Other symptoms and signs involving cognitive function                Time: 1017-5102 OT Time Calculation (min): 11 min Charges:  OT General Charges $OT Visit: 1 Visit OT Evaluation $OT Eval Low Complexity: [place] A Zalyn Amend 11/12/2018, 3:35 PM

## 2018-11-12 NOTE — Progress Notes (Signed)
Discharged to home with family office visits in place teaching done  

## 2018-11-15 ENCOUNTER — Inpatient Hospital Stay: Payer: Medicare Other | Admitting: Family Medicine

## 2018-11-16 LAB — CULTURE, BLOOD (ROUTINE X 2)
Culture: NO GROWTH
Culture: NO GROWTH
Special Requests: ADEQUATE
Special Requests: ADEQUATE

## 2018-11-17 ENCOUNTER — Encounter: Payer: Self-pay | Admitting: Family Medicine

## 2018-11-17 NOTE — Progress Notes (Signed)
Su

## 2018-11-23 ENCOUNTER — Ambulatory Visit: Payer: Medicare Other | Admitting: Family Medicine

## 2018-11-24 ENCOUNTER — Emergency Department (HOSPITAL_COMMUNITY)
Admission: EM | Admit: 2018-11-24 | Discharge: 2018-11-25 | Disposition: A | Discharge status: Home / Self Care | Payer: Medicare Other | Attending: Emergency Medicine | Admitting: Emergency Medicine

## 2018-11-24 DIAGNOSIS — D649 Anemia, unspecified: Secondary | ICD-10-CM | POA: Diagnosis not present

## 2018-11-24 DIAGNOSIS — Z87891 Personal history of nicotine dependence: Secondary | ICD-10-CM | POA: Insufficient documentation

## 2018-11-24 DIAGNOSIS — F0390 Unspecified dementia without behavioral disturbance: Secondary | ICD-10-CM

## 2018-11-24 DIAGNOSIS — R079 Chest pain, unspecified: Secondary | ICD-10-CM | POA: Diagnosis not present

## 2018-11-24 DIAGNOSIS — R404 Transient alteration of awareness: Secondary | ICD-10-CM | POA: Diagnosis not present

## 2018-11-24 DIAGNOSIS — I509 Heart failure, unspecified: Secondary | ICD-10-CM | POA: Diagnosis not present

## 2018-11-24 DIAGNOSIS — Z79899 Other long term (current) drug therapy: Secondary | ICD-10-CM | POA: Diagnosis not present

## 2018-11-24 DIAGNOSIS — I447 Left bundle-branch block, unspecified: Secondary | ICD-10-CM | POA: Diagnosis not present

## 2018-11-24 DIAGNOSIS — R0602 Shortness of breath: Secondary | ICD-10-CM | POA: Diagnosis not present

## 2018-11-24 DIAGNOSIS — I110 Hypertensive heart disease with heart failure: Secondary | ICD-10-CM | POA: Diagnosis not present

## 2018-11-25 ENCOUNTER — Other Ambulatory Visit: Payer: Self-pay | Admitting: Family Medicine

## 2018-11-25 ENCOUNTER — Encounter (HOSPITAL_COMMUNITY): Payer: Self-pay | Admitting: Emergency Medicine

## 2018-11-25 ENCOUNTER — Other Ambulatory Visit: Payer: Self-pay

## 2018-11-25 ENCOUNTER — Emergency Department (HOSPITAL_COMMUNITY): Payer: Medicare Other

## 2018-11-25 DIAGNOSIS — R079 Chest pain, unspecified: Secondary | ICD-10-CM | POA: Diagnosis not present

## 2018-11-25 LAB — RAPID URINE DRUG SCREEN, HOSP PERFORMED
Amphetamines: NOT DETECTED
Barbiturates: NOT DETECTED
Benzodiazepines: NOT DETECTED
Cocaine: NOT DETECTED
Opiates: NOT DETECTED
Tetrahydrocannabinol: NOT DETECTED

## 2018-11-25 LAB — CBC
HCT: 29.9 % — ABNORMAL LOW (ref 36.0–46.0)
Hemoglobin: 8.8 g/dL — ABNORMAL LOW (ref 12.0–15.0)
MCH: 26.9 pg (ref 26.0–34.0)
MCHC: 29.4 g/dL — ABNORMAL LOW (ref 30.0–36.0)
MCV: 91.4 fL (ref 80.0–100.0)
Platelets: 243 10*3/uL (ref 150–400)
RBC: 3.27 MIL/uL — ABNORMAL LOW (ref 3.87–5.11)
RDW: 16.8 % — ABNORMAL HIGH (ref 11.5–15.5)
WBC: 7.9 10*3/uL (ref 4.0–10.5)
nRBC: 0 % (ref 0.0–0.2)

## 2018-11-25 LAB — BASIC METABOLIC PANEL
Anion gap: 13 (ref 5–15)
BUN: 25 mg/dL — ABNORMAL HIGH (ref 8–23)
CO2: 21 mmol/L — ABNORMAL LOW (ref 22–32)
Calcium: 8.7 mg/dL — ABNORMAL LOW (ref 8.9–10.3)
Chloride: 104 mmol/L (ref 98–111)
Creatinine, Ser: 1.16 mg/dL — ABNORMAL HIGH (ref 0.44–1.00)
GFR calc Af Amer: 53 mL/min — ABNORMAL LOW (ref >60)
GFR calc non Af Amer: 46 mL/min — ABNORMAL LOW (ref >60)
Glucose, Bld: 118 mg/dL — ABNORMAL HIGH (ref 70–99)
Potassium: 3.8 mmol/L (ref 3.5–5.1)
Sodium: 138 mmol/L (ref 135–145)

## 2018-11-25 LAB — URINALYSIS, ROUTINE W REFLEX MICROSCOPIC
Bilirubin Urine: NEGATIVE
Glucose, UA: NEGATIVE mg/dL
Ketones, ur: NEGATIVE mg/dL
Nitrite: NEGATIVE
Protein, ur: NEGATIVE mg/dL
Specific Gravity, Urine: 1.018 (ref 1.005–1.030)
pH: 5 (ref 5.0–8.0)

## 2018-11-25 LAB — HEPATIC FUNCTION PANEL
ALT: 12 U/L (ref 0–44)
AST: 17 U/L (ref 15–41)
Albumin: 3.7 g/dL (ref 3.5–5.0)
Alkaline Phosphatase: 96 U/L (ref 38–126)
Bilirubin, Direct: 0.2 mg/dL (ref 0.0–0.2)
Indirect Bilirubin: 0.9 mg/dL (ref 0.3–0.9)
Total Bilirubin: 1.1 mg/dL (ref 0.3–1.2)
Total Protein: 7 g/dL (ref 6.5–8.1)

## 2018-11-25 LAB — TROPONIN I (HIGH SENSITIVITY)
Troponin I (High Sensitivity): 15 ng/L (ref <18)
Troponin I (High Sensitivity): 18 ng/L — ABNORMAL HIGH (ref <18)

## 2018-11-25 LAB — ETHANOL: Alcohol, Ethyl (B): <10 mg/dL (ref <10)

## 2018-11-25 MED ORDER — SODIUM CHLORIDE 0.9% FLUSH: 3.0000 mL | Freq: Once | INTRAVENOUS | Status: DC

## 2018-11-25 NOTE — ED Notes (Signed)
Pt beginning to wander around waiting room stating she needs to leave and that her daughter is coming to get her. This tech called pts family member. Hannah [name] (daughter) stated that she is not coming to get her and she needs to be evaluated. This tech attempted to bring pt back to room. Pt refusing to come back to room and stated " I will walk out of here on foot". Pt thinks it is daylight and does not think she is at [facility]. Charge informed.

## 2018-11-25 NOTE — ED Notes (Signed)
Pt refused both a blood draw and CT scan

## 2018-11-25 NOTE — ED Provider Notes (Signed)
[facility] EMERGENCY DEPARTMENT Provider Note   CSN: [id number] Arrival date & time: 11/24/18  2356    History   Chief Complaint Chief Complaint  Patient presents with  . Chest Pain    HPI Hannah [name] is a 73 y.o. female.   The history is provided by the patient, the EMS personnel and medical records.  Chest Pain She has history of hypertension, hyperlipidemia, diastolic heart failure, GERD and was brought in by ambulance because of chest pain.  EMS reported patient having chest pain over the last 3 days.  Patient however states that she did not come by ambulance.  She states that she was dropped off by a friend because she did not want the friend to have to drive all the way to her home.  She denies having had any chest pain.  Past Medical History:  Diagnosis Date  . Anemia   . Anxiety   . Arthritis    "hands" (05/09/2015)  . GERD (gastroesophageal reflux disease)   . High cholesterol   . History of blood transfusion 1960s; 05/08/2015   "when I had my kids; low HgB"  . Hypertension   . Osteoarthritis   . Pneumonia 05/2004    Patient Active Problem List   Diagnosis Date Noted  . CHF (congestive heart failure) ([facility]) 11/06/2018  . Acute pulmonary edema ([facility])   . Gastrointestinal hemorrhage, unspecified   . Gastric bleed   . Symptomatic anemia 10/27/2018  . Right foot pain 06/22/2018  . Inability to bear weight 06/22/2018  . Left foot pain 05/31/2018  . Elevated TSH 02/03/2018  . Cerebral microvascular disease 02/02/2018  . Renal insufficiency 11/09/2017  . Gout 09/11/2015  . OBESITY 03/26/2010  . Anxiety state 08/09/2008  . Essential hypertension 08/09/2008  . GERD 08/09/2008    Past Surgical History:  Procedure Laterality Date  . BIOPSY  10/28/2018   Procedure: BIOPSY;  Surgeon: Juanita Craver, MD;  Location: [facility] ENDOSCOPY;  Service: Endoscopy;;  . CARDIAC CATHETERIZATION  05/2004  . COLONOSCOPY  07/2002   Archie Endo 09/02/2010  . COLONOSCOPY  WITH PROPOFOL N/A 10/28/2018   Procedure: COLONOSCOPY WITH PROPOFOL;  Surgeon: Juanita Craver, MD;  Location: [facility] ENDOSCOPY;  Service: Endoscopy;  Laterality: N/A;  . ESOPHAGOGASTRODUODENOSCOPY  07/2002   w/biopsies/notes 09/02/2010  . ESOPHAGOGASTRODUODENOSCOPY (EGD) WITH PROPOFOL N/A 10/28/2018   Procedure: ESOPHAGOGASTRODUODENOSCOPY (EGD) WITH PROPOFOL;  Surgeon: Juanita Craver, MD;  Location: [facility] ENDOSCOPY;  Service: Endoscopy;  Laterality: N/A;  . HOT HEMOSTASIS N/A 10/28/2018   Procedure: HOT HEMOSTASIS (ARGON PLASMA COAGULATION/BICAP);  Surgeon: Juanita Craver, MD;  Location[facility] ENDOSCOPY;  Service: Endoscopy;  Laterality: N/A;  . IR ANGIOGRAM SELECTIVE EACH ADDITIONAL VESSEL  10/30/2018  . IR ANGIOGRAM SELECTIVE EACH ADDITIONAL VESSEL  10/30/2018  . IR ANGIOGRAM SELECTIVE EACH ADDITIONAL VESSEL  10/30/2018  . IR ANGIOGRAM SELECTIVE EACH ADDITIONAL VESSEL  10/30/2018  . IR ANGIOGRAM VISCERAL SELECTIVE  10/30/2018  . IR EMBO ART  VEN HEMORR LYMPH EXTRAV  INC GUIDE ROADMAPPING  10/30/2018  . IR US GUIDANCE  10/30/2018  . POLYPECTOMY  10/28/2018   Procedure: POLYPECTOMY;  Surgeon: Juanita Craver, MD;  Location: [facility] ENDOSCOPY;  Service: Endoscopy;;     OB History   No obstetric history on file.      Home Medications    Prior to Admission medications   Medication Sig Start Date End Date Taking? Authorizing Provider  acetaminophen (TYLENOL) 325 MG tablet Take 650 mg by mouth every 6 (six) hours as needed  for mild pain or headache.    [provider]  ALPRAZolam (XANAX) 0.25 MG tablet TAKE 1 TABLET (0.25 MG TOTAL) BY MOUTH AT BEDTIME AS NEEDED. Patient taking differently: Take 0.25 mg by mouth at bedtime as needed for anxiety or sleep.  10/14/18   [name], MD  atorvastatin (LIPITOR) 40 MG tablet Take 1 tablet (40 mg total) by mouth daily. 05/31/18   [name], MD  feeding supplement, ENSURE ENLIVE, (ENSURE ENLIVE) LIQD Take 237 mLs by mouth 2 (two) times daily between meals.  11/08/18   Lurline Del, MD  ferrous sulfate 325 (65 FE) MG tablet Take 325 mg by mouth daily with breakfast.    [provider]  furosemide (LASIX) 40 MG tablet Take 1 tablet (40 mg total) by mouth daily. 11/13/18   Anderson, Chelsey L, DO  metoprolol tartrate (LOPRESSOR) 25 MG tablet Take 0.5 tablets (12.5 mg total) by mouth 2 (two) times daily. 11/12/18   Anderson, Chelsey L, DO  omeprazole (PRILOSEC) 20 MG capsule TAKE 1 CAPSULE BY MOUTH DAILY AS NEEDED Patient taking differently: Take 20 mg by mouth as needed.  08/09/18   [name], MD  sertraline (ZOLOFT) 50 MG tablet TAKE 1/2 TABLET BY MOUTH DAILY Patient taking differently: Take 50 mg by mouth daily.  09/13/18   [name], MD    Family History Family History  Problem Relation Age of Onset  . Heart disease Mother        died at age 21  . Breast cancer Sister   . Cancer Sister        Breast  . Hypertension Father   . Hypertension Sister     Social History Social History   Tobacco Use  . Smoking status: Former Smoker    Packs/day: 0.30    Years: 8.00    Pack years: 2.40    Types: Cigarettes    Quit date: 05/30/2004    Years since quitting: 14.4  . Smokeless tobacco: Never Used  Substance Use Topics  . Alcohol use: No    Alcohol/week: 0.0 standard drinks  . Drug use: Never     Allergies   Patient has no known allergies.   Review of Systems Review of Systems  Unable to perform ROS: Mental status change  Cardiovascular: Positive for chest pain.     Physical Exam Updated Vital Signs BP (!) 147/66 (BP Location: Right Arm)   Pulse 96   Temp 98.4 F (36.9 C) (Oral)   Resp 18   SpO2 99%   Physical Exam Vitals signs and nursing note reviewed.    73 year old female, resting comfortably and in no acute distress. Vital signs are significant for mildly elevated blood pressure. Oxygen saturation is 99%, which is normal. Head is normocephalic and atraumatic. PERRLA, EOMI. Oropharynx  is clear. Neck is nontender and supple without adenopathy or JVD. Back is nontender and there is no CVA tenderness. Lungs are clear without rales, wheezes, or rhonchi. Chest is nontender. Heart has regular rate and rhythm without murmur. Abdomen is soft, flat, nontender without masses or hepatosplenomegaly and peristalsis is normoactive. Extremities have trace edema, full range of motion is present. Skin is warm and dry without rash. Neurologic: Awake and alert, partly oriented to place (states that is a doctors place but was unable to identify it as a hospital) and partly oriented to time (knows it is 2020, but thinks it is February), cranial nerves are intact, there are no motor  or sensory deficits.  ED Treatments / Results  Labs (all labs ordered are listed, but only abnormal results are displayed) Labs Reviewed  BASIC METABOLIC PANEL - Abnormal; Notable for the following components:      Result Value   CO2 21 (*)    Glucose, Bld 118 (*)    BUN 25 (*)    Creatinine, Ser 1.16 (*)    Calcium 8.7 (*)    GFR calc non Af Amer 46 (*)    GFR calc Af Amer 53 (*)    All other components within normal limits  CBC - Abnormal; Notable for the following components:   RBC 3.27 (*)    Hemoglobin 8.8 (*)    HCT 29.9 (*)    MCHC 29.4 (*)    RDW 16.8 (*)    All other components within normal limits  TROPONIN I (HIGH SENSITIVITY) - Abnormal; Notable for the following components:   Troponin I (High Sensitivity) 18 (*)    All other components within normal limits  ETHANOL  HEPATIC FUNCTION PANEL  URINALYSIS, ROUTINE W REFLEX MICROSCOPIC  RAPID URINE DRUG SCREEN, HOSP PERFORMED  TROPONIN I (HIGH SENSITIVITY)    EKG EKG Interpretation  Date/Time:  Friday November 25 2018 00:14:10 EDT Ventricular Rate:  100 PR Interval:  166 QRS Duration: 114 QT Interval:  388 QTC Calculation: 500 R Axis:   55 Text Interpretation:  Normal sinus rhythm Septal infarct , age undetermined Abnormal ECG When  compared with ECG of 11/11/2018, No significant change was found Confirmed by Delora Fuel ([id number]) on 11/25/2018 2:24:05 AM   Radiology Dg Chest 2 View  Result Date: 11/25/2018 CLINICAL DATA:  Chest pain EXAM: CHEST - 2 VIEW COMPARISON:  11/10/2018 FINDINGS: Heart is normal size. Mild interstitial prominence, improved since prior study. No confluent opacities or effusions. No acute bony abnormality. IMPRESSION: Previously seen interstitial prominence has improved. Mild residual interstitial prominence could reflect slight residual interstitial edema. Electronically Signed   By: Rolm Baptise M.D.   On: 11/25/2018 00:36    Procedures Procedures  Medications Ordered in ED Medications  sodium chloride flush (NS) 0.9 % injection 3 mL (has no administration in time range)     Initial Impression / Assessment and Plan / ED Course  I have reviewed the triage vital signs and the nursing notes.  Pertinent labs & imaging results that were available during my care of the patient were reviewed by me and considered in my medical decision making (see chart for details).  Apparent recent chest pain.  Confusion.  ECG shows no acute changes and initial troponin is normal.  Chronic anemia is present unchanged from baseline and chronic renal insufficiency is present unchanged from baseline.  Old records reviewed showing recent hospitalization for flash pulmonary edema.  Clinically, patient is doing well except for her confusion.  Unable to make any reasonable assessment about her chest pain given her denial that it even is present.  Will check CT of head, hepatic function panel, urinalysis and urine drug screen to look for causes of confusion.  Repeat troponin is normal.  Chest x-ray has improved compared with recent x-ray.  I have spoken with the patient's daughter and patient has severe dementia and her current mental status is actually her baseline.  No need for CT of head.  She is discharged with instructions to  follow-up with PCP.  Final Clinical Impressions(s) / ED Diagnoses   Final diagnoses:  Nonspecific chest pain  Dementia without behavioral disturbance, unspecified  dementia type [facility])  Normocytic anemia    ED Discharge Orders    None       Delora Fuel, MD 48/35/07 [phone]

## 2018-11-25 NOTE — ED Triage Notes (Signed)
Pt BIB [facility] from home, c/o intermittent chest pain x 3 days. On EMS arrival, pt denied having chest pain. No other associated symptoms. Given 324mg  asa PTA.

## 2018-11-25 NOTE — ED Notes (Signed)
[place] pts daughter wants an update on mother, states mother has dementia

## 2018-11-25 NOTE — ED Notes (Signed)
Patient verbalizes understanding of discharge instructions. Opportunity for questioning and answers were provided. Pt discharged from ED. 

## 2018-11-25 NOTE — ED Notes (Signed)
Attempted to call pt's daughter; no answer.

## 2018-11-29 ENCOUNTER — Other Ambulatory Visit: Payer: Self-pay

## 2018-11-29 ENCOUNTER — Ambulatory Visit (INDEPENDENT_AMBULATORY_CARE_PROVIDER_SITE_OTHER): Payer: Medicare Other | Admitting: Family Medicine

## 2018-11-29 ENCOUNTER — Encounter: Payer: Self-pay | Admitting: Family Medicine

## 2018-11-29 DIAGNOSIS — I679 Cerebrovascular disease, unspecified: Secondary | ICD-10-CM | POA: Diagnosis not present

## 2018-11-29 DIAGNOSIS — N289 Disorder of kidney and ureter, unspecified: Secondary | ICD-10-CM

## 2018-11-29 DIAGNOSIS — I6789 Other cerebrovascular disease: Secondary | ICD-10-CM

## 2018-11-29 DIAGNOSIS — I509 Heart failure, unspecified: Secondary | ICD-10-CM | POA: Diagnosis not present

## 2018-11-29 DIAGNOSIS — K922 Gastrointestinal hemorrhage, unspecified: Secondary | ICD-10-CM | POA: Diagnosis not present

## 2018-11-29 MED ORDER — OMEPRAZOLE 20 MG PO CPDR: DELAYED_RELEASE_CAPSULE | ORAL | 0 refills | Status: DC

## 2018-11-29 MED ORDER — BUSPIRONE HCL 5 MG PO TABS: 5.0000 mg | ORAL_TABLET | Freq: Two times a day (BID) | ORAL | 1 refills | Status: DC | PRN

## 2018-11-29 NOTE — Assessment & Plan Note (Signed)
Creatinine improved at recent ER visit

## 2018-11-29 NOTE — Patient Instructions (Addendum)
Good to see you today!  Thanks for coming in.  For the Heart - continue the Lasix once a day - contact me if any shortness of breath or leg swelling  For the Anxiety - Use buspar 5 mg twice a day as needed  - continue the zoloft 50 mg daily - Call me if the anxiety is worsening  For the blood - take the iron every day - add a multivitamin  Come back in one month for a visit and blood test

## 2018-11-29 NOTE — Assessment & Plan Note (Signed)
With anemia.  Improving.  Continue iron

## 2018-11-29 NOTE — Progress Notes (Signed)
Subjective  Hannah [name] is a 73 y.o. female is presenting with the following  ANEMIA Had gi bleed last month.  None since and is on iron. No lightheadness or chest pain or evident bleeding  CHF Had several episodes of sudden onset CHF.  Now on diuretic and betablocker.  Last week had episode of chest pain.  Seen in ER work up negative.  No further episodes.  Has occasional mild foot swelling that comes and goes.  No problems sleeping  ANXIETY Taking sertraline once daily.  Ran out of xanax.  Daughter feels needs something as needed.  Usually in good spirits but has episodes of anxiety that sometimes can spiral.  Sleeps well   Social History   Social History Narrative   Emergency Contact: Hannah [name] [phone]   End of Life Plan: gave pt ad pamplet   Any pets: 1 Dog    Diet: pt has a varied diet, low consumption of carbs and fatty foods    Exercise: pt does not have a regular exercise routine    Seatbelts: pt wears seat regularly in car   Hobbies: spending time with grandchildren      *Updated 09/26/2018*   Patients daughter Hannah [name] lives with her and assists her in driving to doctors apts and errands. Patient does have a renewed drivers licenses, but feels more comfortable riding with her daughter. Patient enjoys going out to eat and spending time with her grandchildren who live in [place]. Patient stated they come during the summer to see her. Patient is excited her son, Vicente Males, is about to finish school to become a Theme park manager.    The patient has great family support system.      Chief Complaint noted Review of Symptoms - see HPI PMH - Smoking status noted.    Objective Vital Signs reviewed BP (!) 144/72   Pulse (!) 106   Wt 167 lb 9.6 oz (76 kg)   SpO2 99%   BMI 30.65 kg/m  History is confirmed by daughter Heart - Regular rate and rhythm.  Gr2/6 sys  Murmur, no gallops or rubs.    Lungs:  Normal respiratory effort, chest expands symmetrically. Lungs are clear to  auscultation, no crackles or wheezes. Extremities:  No cyanosis, edema, or deformity noted with good range of motion of all major joints.    Assessments/Plans  See after visit summary for details of patient instuctions  CHF (congestive heart failure) ([place]) Stable today.  Continue current medications and monitor closely.  Once her hgb is normal may consider increasing blood pressure control possibly with ACEI given her CHF and CRI  Cerebral microvascular disease Has severe short term memory loss.      Gastric bleed With anemia.  Improving.  Continue iron   Renal insufficiency Creatinine improved at recent ER visit

## 2018-11-29 NOTE — Assessment & Plan Note (Signed)
Has severe short term memory loss.

## 2018-11-29 NOTE — Assessment & Plan Note (Addendum)
Stable today.  Continue current medications and monitor closely.  Once her hgb is normal may consider increasing blood pressure control possibly with ACEI given her CHF and CRI

## 2018-11-30 ENCOUNTER — Other Ambulatory Visit: Payer: Self-pay

## 2018-11-30 ENCOUNTER — Emergency Department (HOSPITAL_COMMUNITY)
Admission: EM | Admit: 2018-11-30 | Discharge: 2018-11-30 | Disposition: A | Discharge status: Home / Self Care | Payer: Medicare Other | Attending: Emergency Medicine | Admitting: Emergency Medicine

## 2018-11-30 ENCOUNTER — Emergency Department (HOSPITAL_COMMUNITY): Payer: Medicare Other

## 2018-11-30 ENCOUNTER — Telehealth: Payer: Self-pay | Admitting: Family Medicine

## 2018-11-30 ENCOUNTER — Encounter (HOSPITAL_COMMUNITY): Payer: Self-pay

## 2018-11-30 ENCOUNTER — Encounter: Payer: Self-pay | Admitting: Family Medicine

## 2018-11-30 DIAGNOSIS — Z20828 Contact with and (suspected) exposure to other viral communicable diseases: Secondary | ICD-10-CM | POA: Diagnosis not present

## 2018-11-30 DIAGNOSIS — I10 Essential (primary) hypertension: Secondary | ICD-10-CM | POA: Diagnosis not present

## 2018-11-30 DIAGNOSIS — R531 Weakness: Secondary | ICD-10-CM | POA: Insufficient documentation

## 2018-11-30 DIAGNOSIS — D649 Anemia, unspecified: Secondary | ICD-10-CM | POA: Diagnosis not present

## 2018-11-30 DIAGNOSIS — Z79899 Other long term (current) drug therapy: Secondary | ICD-10-CM | POA: Insufficient documentation

## 2018-11-30 DIAGNOSIS — R195 Other fecal abnormalities: Secondary | ICD-10-CM | POA: Diagnosis not present

## 2018-11-30 DIAGNOSIS — R0689 Other abnormalities of breathing: Secondary | ICD-10-CM | POA: Diagnosis not present

## 2018-11-30 DIAGNOSIS — R079 Chest pain, unspecified: Secondary | ICD-10-CM | POA: Diagnosis not present

## 2018-11-30 DIAGNOSIS — F01518 Vascular dementia, unspecified severity, with other behavioral disturbance: Secondary | ICD-10-CM

## 2018-11-30 DIAGNOSIS — R0602 Shortness of breath: Secondary | ICD-10-CM | POA: Diagnosis not present

## 2018-11-30 DIAGNOSIS — F0151 Vascular dementia with behavioral disturbance: Secondary | ICD-10-CM

## 2018-11-30 DIAGNOSIS — R404 Transient alteration of awareness: Secondary | ICD-10-CM | POA: Diagnosis not present

## 2018-11-30 DIAGNOSIS — I447 Left bundle-branch block, unspecified: Secondary | ICD-10-CM | POA: Diagnosis not present

## 2018-11-30 DIAGNOSIS — Z87891 Personal history of nicotine dependence: Secondary | ICD-10-CM | POA: Insufficient documentation

## 2018-11-30 LAB — URINALYSIS, ROUTINE W REFLEX MICROSCOPIC
Bilirubin Urine: NEGATIVE
Glucose, UA: NEGATIVE mg/dL
Hgb urine dipstick: NEGATIVE
Ketones, ur: NEGATIVE mg/dL
Nitrite: NEGATIVE
Protein, ur: NEGATIVE mg/dL
Specific Gravity, Urine: 1.018 (ref 1.005–1.030)
pH: 5 (ref 5.0–8.0)

## 2018-11-30 LAB — CBC WITH DIFFERENTIAL/PLATELET
Abs Immature Granulocytes: 0.02 10*3/uL (ref 0.00–0.07)
Basophils Absolute: 0 10*3/uL (ref 0.0–0.1)
Basophils Relative: 1 %
Eosinophils Absolute: 0 10*3/uL (ref 0.0–0.5)
Eosinophils Relative: 0 %
HCT: 26.4 % — ABNORMAL LOW (ref 36.0–46.0)
Hemoglobin: 7.6 g/dL — ABNORMAL LOW (ref 12.0–15.0)
Immature Granulocytes: 0 %
Lymphocytes Relative: 21 %
Lymphs Abs: 1.5 10*3/uL (ref 0.7–4.0)
MCH: 27.5 pg (ref 26.0–34.0)
MCHC: 28.8 g/dL — ABNORMAL LOW (ref 30.0–36.0)
MCV: 95.7 fL (ref 80.0–100.0)
Monocytes Absolute: 0.6 10*3/uL (ref 0.1–1.0)
Monocytes Relative: 8 %
Neutro Abs: 5.1 10*3/uL (ref 1.7–7.7)
Neutrophils Relative %: 70 %
Platelets: 224 10*3/uL (ref 150–400)
RBC: 2.76 MIL/uL — ABNORMAL LOW (ref 3.87–5.11)
RDW: 18.2 % — ABNORMAL HIGH (ref 11.5–15.5)
WBC: 7.2 10*3/uL (ref 4.0–10.5)
nRBC: 0 % (ref 0.0–0.2)

## 2018-11-30 LAB — BASIC METABOLIC PANEL
Anion gap: 9 (ref 5–15)
BUN: 22 mg/dL (ref 8–23)
CO2: 23 mmol/L (ref 22–32)
Calcium: 8.4 mg/dL — ABNORMAL LOW (ref 8.9–10.3)
Chloride: 108 mmol/L (ref 98–111)
Creatinine, Ser: 1.1 mg/dL — ABNORMAL HIGH (ref 0.44–1.00)
GFR calc Af Amer: 57 mL/min — ABNORMAL LOW (ref >60)
GFR calc non Af Amer: 49 mL/min — ABNORMAL LOW (ref >60)
Glucose, Bld: 118 mg/dL — ABNORMAL HIGH (ref 70–99)
Potassium: 4.1 mmol/L (ref 3.5–5.1)
Sodium: 140 mmol/L (ref 135–145)

## 2018-11-30 LAB — SARS CORONAVIRUS 2 BY RT PCR (HOSPITAL ORDER, PERFORMED IN ~~LOC~~ HOSPITAL LAB): SARS Coronavirus 2: NEGATIVE

## 2018-11-30 LAB — BRAIN NATRIURETIC PEPTIDE: B Natriuretic Peptide: 210.6 pg/mL — ABNORMAL HIGH (ref 0.0–100.0)

## 2018-11-30 LAB — POC OCCULT BLOOD, ED: Fecal Occult Bld: POSITIVE — AB

## 2018-11-30 MED ORDER — ACETAMINOPHEN 325 MG PO TABS
650.0000 mg | ORAL_TABLET | Freq: Once | ORAL | Status: AC
  Administered 2018-11-30: 650 mg via ORAL
  Filled 2018-11-30: qty 2

## 2018-11-30 NOTE — ED Provider Notes (Signed)
Patient signed out to me from Dr. Dina Rich.  73 year old female with recent admissions for pulmonary edema and another admission for GI bleed here for myalgias plus minus shortness of breath. Physical Exam  BP 130/61 (BP Location: Right Arm)   Pulse 94   Temp 98 F (36.7 C) (Oral)   Resp (!) 21   SpO2 97%   Physical Exam  ED Course/Procedures   Clinical Course as of Nov 29 1656  Wed Nov 30, 2018  4825 Chart reviewed.  Hemoglobin today 7.6.  This is down from mid 8s.  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  At time of discharge her hemoglobin was 7.5.  It trended upwards to the mid 9s.  Now has begun to trend downward.  Patient denies any bloody or dark stools.  Denies any hematemesis.   [CH]  0740 Family practice team call back and I asked if they could reviewed this patient for possible admission versus close follow-up and they will give me a call back after they review her chart.   [MB]  [id number] I went to see the patient was doing.  She ambulated to the bathroom with some assistance and the nurse felt she was a little unsteady.  The patient herself said she was fine and she is going home.  She said she is calling for a ride now and she needs somebody to bring her to the front.  She understands she will need close follow-up and I will page the family practice team to see if they can arrange that from their side.   [MB]  0911 Discussed with family practice team and they are recommending no antibiotics and will follow up with her.  She was going to make the patient appointment for tomorrow and she was actually in the clinic yesterday so she has reasonably close follow-up.   [MB]    Clinical Course User Index [CH] Horton, Barbette Hair, MD [MB] Hayden Rasmussen, MD    Procedures  MDM  Plan is to follow-up on urinalysis and review with family practice team regarding admission versus home with close follow-up.      Hayden Rasmussen, MD 11/30/18  [phone]

## 2018-11-30 NOTE — Telephone Encounter (Signed)
Spoke with daughter.  Patient is in no distress but may be moving a bit slower  She has follow up appointment in [facility] tomorrow.    Told daughter that she should accompany her to all visits including ER given the dementia   She agrees

## 2018-11-30 NOTE — Discharge Instructions (Addendum)
You were seen in the emergency department for body aches and feeling unwell.  Your Covid testing was negative.  You had some signs of trace bleeding on your rectal exam and you are more anemic and this will need to be followed by your doctor.  You were offered admission but you felt well enough to go home.  Please follow-up with your primary care doctor tomorrow.  Return to the emergency department if any worsening symptoms.

## 2018-11-30 NOTE — ED Triage Notes (Signed)
Patient coming from home with complaints of SOB that started suddenly. Patient unable to tolerate lying flat and was placed on 2 L of O2 via [place] by EMS for comfort. Patient SpO2 99% without oxygen. Patient denies chest pain currently.   Patient has a history of dementia and is normally oriented to self, place, and event.

## 2018-11-30 NOTE — ED Provider Notes (Signed)
[facility] DEPT Provider Note   CSN: [id number] Arrival date & time: 11/30/18  0353    History   Chief Complaint Chief Complaint  Patient presents with  . Shortness of Breath    HPI Hannah [name] is a 73 y.o. female.     HPI This is a 73 year old female with a history of hypertension, CHF, GI bleed who presents with generalized myalgias.  Patient reports that she did not feel well when she went to bed.  She states that she has body aches all over.  EMS reported shortness of breath; however, patient denies this to me.  She denies chest pain or leg swelling.  She denies any recent fevers or cough.  No known sick contacts.  She just reports body aches" and not feel well and something must be wrong."  Patient with recent admission for flash pulmonary edema.  Also recent history of GI bleed; coiling of duodenal aneurysm.  Past Medical History:  Diagnosis Date  . Anemia   . Anxiety   . Arthritis    "hands" (05/09/2015)  . GERD (gastroesophageal reflux disease)   . High cholesterol   . History of blood transfusion 1960s; 05/08/2015   "when I had my kids; low HgB"  . Hypertension   . Osteoarthritis   . Pneumonia 05/2004    Patient Active Problem List   Diagnosis Date Noted  . CHF (congestive heart failure) ([place]) 11/06/2018  . Gastric bleed   . Elevated TSH 02/03/2018  . Cerebral microvascular disease 02/02/2018  . Renal insufficiency 11/09/2017  . Gout 09/11/2015  . OBESITY 03/26/2010  . Anxiety state 08/09/2008  . Essential hypertension 08/09/2008  . GERD 08/09/2008    Past Surgical History:  Procedure Laterality Date  . BIOPSY  10/28/2018   Procedure: BIOPSY;  Surgeon: Juanita Craver, MD;  Location: [facility] ENDOSCOPY;  Service: Endoscopy;;  . CARDIAC CATHETERIZATION  05/2004  . COLONOSCOPY  07/2002   Archie Endo 09/02/2010  . COLONOSCOPY WITH PROPOFOL N/A 10/28/2018   Procedure: COLONOSCOPY WITH PROPOFOL;  Surgeon: Juanita Craver, MD;  Location: [facility]  ENDOSCOPY;  Service: Endoscopy;  Laterality: N/A;  . ESOPHAGOGASTRODUODENOSCOPY  07/2002   w/biopsies/notes 09/02/2010  . ESOPHAGOGASTRODUODENOSCOPY (EGD) WITH PROPOFOL N/A 10/28/2018   Procedure: ESOPHAGOGASTRODUODENOSCOPY (EGD) WITH PROPOFOL;  Surgeon: Juanita Craver, MD;  Location: [facility] ENDOSCOPY;  Service: Endoscopy;  Laterality: N/A;  . HOT HEMOSTASIS N/A 10/28/2018   Procedure: HOT HEMOSTASIS (ARGON PLASMA COAGULATION/BICAP);  Surgeon: Juanita Craver, MD;  Location: [facility] ENDOSCOPY;  Service: Endoscopy;  Laterality: N/A;  . IR ANGIOGRAM SELECTIVE EACH ADDITIONAL VESSEL  10/30/2018  . IR ANGIOGRAM SELECTIVE EACH ADDITIONAL VESSEL  10/30/2018  . IR ANGIOGRAM SELECTIVE EACH ADDITIONAL VESSEL  10/30/2018  . IR ANGIOGRAM SELECTIVE EACH ADDITIONAL VESSEL  10/30/2018  . IR ANGIOGRAM VISCERAL SELECTIVE  10/30/2018  . IR EMBO ART  VEN HEMORR LYMPH EXTRAV  INC GUIDE ROADMAPPING  10/30/2018  . IR US GUIDANCE  10/30/2018  . POLYPECTOMY  10/28/2018   Procedure: POLYPECTOMY;  Surgeon: Juanita Craver, MD;  Location: [facility] ENDOSCOPY;  Service: Endoscopy;;     OB History   No obstetric history on file.      Home Medications    Prior to Admission medications   Medication Sig Start Date End Date Taking? Authorizing Provider  acetaminophen (TYLENOL) 325 MG tablet Take 650 mg by mouth every 6 (six) hours as needed for mild pain or headache.    [provider]  atorvastatin (LIPITOR) 40 MG tablet Take 1  tablet (40 mg total) by mouth daily. 05/31/18   Chambliss, Jeb Levering, MD  busPIRone (BUSPAR) 5 MG tablet Take 1 tablet (5 mg total) by mouth 2 (two) times daily as needed. 11/29/18   [name], MD  ferrous sulfate 325 (65 FE) MG tablet Take 325 mg by mouth daily with breakfast.    [provider]  furosemide (LASIX) 40 MG tablet Take 1 tablet (40 mg total) by mouth daily. 11/13/18   Anderson, Chelsey L, DO  metoprolol tartrate (LOPRESSOR) 25 MG tablet Take 0.5 tablets (12.5 mg total) by mouth 2  (two) times daily. 11/12/18   Anderson, Chelsey L, DO  omeprazole (PRILOSEC) 20 MG capsule TAKE 1 CAPSULE BY MOUTH DAILY AS NEEDED 11/29/18   [name], MD  sertraline (ZOLOFT) 50 MG tablet Take 1 tablet (50 mg total) by mouth daily. 11/29/18   [name], MD    Family History Family History  Problem Relation Age of Onset  . Heart disease Mother        died at age 46  . Breast cancer Sister   . Cancer Sister        Breast  . Hypertension Father   . Hypertension Sister     Social History Social History   Tobacco Use  . Smoking status: Former Smoker    Packs/day: 0.30    Years: 8.00    Pack years: 2.40    Types: Cigarettes    Quit date: 05/30/2004    Years since quitting: 14.5  . Smokeless tobacco: Never Used  Substance Use Topics  . Alcohol use: No    Alcohol/week: 0.0 standard drinks  . Drug use: Never     Allergies   Patient has no known allergies.   Review of Systems Review of Systems  Constitutional: Negative for fever.  Respiratory: Negative for shortness of breath.   Cardiovascular: Negative for chest pain.  Gastrointestinal: Negative for abdominal pain, nausea and vomiting.  Genitourinary: Negative for dysuria.  Musculoskeletal: Positive for myalgias.  Neurological: Negative for dizziness and weakness.  All other systems reviewed and are negative.    Physical Exam Updated Vital Signs BP 130/61 (BP Location: Right Arm)   Pulse 94   Temp 98 F (36.7 C) (Oral)   Resp (!) 21   SpO2 97%   Physical Exam Vitals signs and nursing note reviewed.  Constitutional:      Appearance: She is well-developed. She is obese.  HENT:     Head: Normocephalic and atraumatic.  Neck:     Musculoskeletal: Neck supple.  Cardiovascular:     Rate and Rhythm: Normal rate and regular rhythm.     Heart sounds: Normal heart sounds.  Pulmonary:     Effort: Pulmonary effort is normal. No respiratory distress.     Breath sounds: No wheezing.   Abdominal:     General: Bowel sounds are normal.     Palpations: Abdomen is soft.     Tenderness: There is no abdominal tenderness.  Genitourinary:    Rectum: Guaiac result negative.  Musculoskeletal:     Right lower leg: She exhibits no tenderness. No edema.     Left lower leg: She exhibits no tenderness. No edema.  Skin:    General: Skin is warm and dry.  Neurological:     Mental Status: She is alert and oriented to person, place, and time.  Psychiatric:        Mood and Affect: Mood normal.  ED Treatments / Results  Labs (all labs ordered are listed, but only abnormal results are displayed) Labs Reviewed  CBC WITH DIFFERENTIAL/PLATELET - Abnormal; Notable for the following components:      Result Value   RBC 2.76 (*)    Hemoglobin 7.6 (*)    HCT 26.4 (*)    MCHC 28.8 (*)    RDW 18.2 (*)    All other components within normal limits  BASIC METABOLIC PANEL - Abnormal; Notable for the following components:   Glucose, Bld 118 (*)    Creatinine, Ser 1.10 (*)    Calcium 8.4 (*)    GFR calc non Af Amer 49 (*)    GFR calc Af Amer 57 (*)    All other components within normal limits  BRAIN NATRIURETIC PEPTIDE - Abnormal; Notable for the following components:   B Natriuretic Peptide 210.6 (*)    All other components within normal limits  POC OCCULT BLOOD, ED - Abnormal; Notable for the following components:   Fecal Occult Bld POSITIVE (*)    All other components within normal limits  SARS CORONAVIRUS 2 (HOSPITAL ORDER, [place] [facility])  URINALYSIS, ROUTINE W REFLEX MICROSCOPIC  TYPE AND SCREEN    EKG EKG Interpretation  Date/Time:  Wednesday November 30 2018 04:10:41 EDT Ventricular Rate:  91 PR Interval:    QRS Duration: 128 QT Interval:  416 QTC Calculation: 512 R Axis:   35 Text Interpretation:  Sinus rhythm Left bundle branch block No significant change since last tracing Confirmed by Thayer Jew [phone]) on 11/30/2018 4:19:25 AM    Radiology Dg Chest Portable 1 View  Result Date: 11/30/2018 CLINICAL DATA:  Shortness of breath EXAM: PORTABLE CHEST 1 VIEW COMPARISON:  Five days ago FINDINGS: Large lung volumes with symmetric interstitial coarsening and airway cuffing, stable. Mild haziness of the lower chest with possible small pleural effusions. Heart size and mediastinal contours are stable and normal. IMPRESSION: Interstitial opacity which could be bronchitic or congestive. Suspect trace pleural effusions. Electronically Signed   By: Monte Fantasia M.D.   On: 11/30/2018 04:57    Procedures Procedures (including critical care time)  Medications Ordered in ED Medications  acetaminophen (TYLENOL) tablet 650 mg (650 mg Oral Given 11/30/18 3557)     Initial Impression / Assessment and Plan / ED Course  I have reviewed the triage vital signs and the nursing notes.  Pertinent labs & imaging results that were available during my care of the patient were reviewed by me and considered in my medical decision making (see chart for details).  Clinical Course as of Nov 30 722  Wed Nov 30, 2018  3220 Chart reviewed.  Hemoglobin today 7.6.  This is down from mid 8s.  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  At time of discharge her hemoglobin was 7.5.  It trended upwards to the mid 9s.  Now has begun to trend downward.  Patient denies any bloody or dark stools.  Denies any hematemesis.   [CH]    Clinical Course User Index [CH] Ronne Savoia, Barbette Hair, MD       Patient presents with myalgias.  EMS reported shortness of breath.  Patient denies this to me.  He does not appear overtly volume overloaded.  O2 sats 97%.  She is afebrile.  Chest x-ray obtained and does not show overt pulmonary edema.  EKG is without ischemia.  Lab work-up is mostly notable for continuing to downtrend hemoglobin.  Her rectal exam is without gross blood.  Hemoccult was positive.  It is not clear what her transfusion  threshold is.  She was discharged in July with a hemoglobin of 7.5.  Will discuss with family practice who has admitted the patient multiple times recently.  Urinalysis is pending.  Final Clinical Impressions(s) / ED Diagnoses   Final diagnoses:  None    ED Discharge Orders    None       Dheeraj Hail, Barbette Hair, MD 11/30/18 [phone]

## 2018-11-30 NOTE — ED Notes (Signed)
Urine culture sent with specimen 

## 2018-11-30 NOTE — Telephone Encounter (Signed)
Daughter is calling and would like to speak to Dr. Erin Hearing. Her mother is is the hospital and they are running test some test. JW

## 2018-11-30 NOTE — Progress Notes (Signed)
PIV consult: discussed with Ronalee Belts, RN he reports PIV in hand is questionable. Site assessed, patent. Flushes easily. MD in room, it is unclear if being admitted at this time. Please consult phlebotomy for lab draw if needed. Re-enter consult if incompatible meds are ordered.

## 2018-12-01 ENCOUNTER — Encounter (HOSPITAL_COMMUNITY): Payer: Self-pay | Admitting: Emergency Medicine

## 2018-12-01 ENCOUNTER — Other Ambulatory Visit: Payer: Self-pay

## 2018-12-01 ENCOUNTER — Emergency Department (HOSPITAL_COMMUNITY)
Admission: EM | Admit: 2018-12-01 | Discharge: 2018-12-01 | Disposition: A | Discharge status: Home / Self Care | Payer: Medicare Other | Attending: Emergency Medicine | Admitting: Emergency Medicine

## 2018-12-01 ENCOUNTER — Emergency Department (HOSPITAL_COMMUNITY): Payer: Medicare Other

## 2018-12-01 ENCOUNTER — Inpatient Hospital Stay: Payer: Medicare Other

## 2018-12-01 DIAGNOSIS — I110 Hypertensive heart disease with heart failure: Secondary | ICD-10-CM | POA: Insufficient documentation

## 2018-12-01 DIAGNOSIS — F0390 Unspecified dementia without behavioral disturbance: Secondary | ICD-10-CM | POA: Insufficient documentation

## 2018-12-01 DIAGNOSIS — R0602 Shortness of breath: Secondary | ICD-10-CM | POA: Diagnosis not present

## 2018-12-01 DIAGNOSIS — R064 Hyperventilation: Secondary | ICD-10-CM | POA: Diagnosis not present

## 2018-12-01 DIAGNOSIS — Z79899 Other long term (current) drug therapy: Secondary | ICD-10-CM | POA: Insufficient documentation

## 2018-12-01 DIAGNOSIS — D649 Anemia, unspecified: Secondary | ICD-10-CM | POA: Insufficient documentation

## 2018-12-01 DIAGNOSIS — R079 Chest pain, unspecified: Secondary | ICD-10-CM | POA: Diagnosis not present

## 2018-12-01 DIAGNOSIS — I509 Heart failure, unspecified: Secondary | ICD-10-CM | POA: Insufficient documentation

## 2018-12-01 DIAGNOSIS — R531 Weakness: Secondary | ICD-10-CM | POA: Diagnosis present

## 2018-12-01 DIAGNOSIS — Z87891 Personal history of nicotine dependence: Secondary | ICD-10-CM | POA: Diagnosis not present

## 2018-12-01 DIAGNOSIS — I447 Left bundle-branch block, unspecified: Secondary | ICD-10-CM | POA: Diagnosis not present

## 2018-12-01 LAB — COMPREHENSIVE METABOLIC PANEL
ALT: 10 U/L (ref 0–44)
AST: 17 U/L (ref 15–41)
Albumin: 3.7 g/dL (ref 3.5–5.0)
Alkaline Phosphatase: 109 U/L (ref 38–126)
Anion gap: 9 (ref 5–15)
BUN: 18 mg/dL (ref 8–23)
CO2: 24 mmol/L (ref 22–32)
Calcium: 9.1 mg/dL (ref 8.9–10.3)
Chloride: 107 mmol/L (ref 98–111)
Creatinine, Ser: 0.98 mg/dL (ref 0.44–1.00)
GFR calc Af Amer: >60 mL/min (ref >60)
GFR calc non Af Amer: 56 mL/min — ABNORMAL LOW (ref >60)
Glucose, Bld: 120 mg/dL — ABNORMAL HIGH (ref 70–99)
Potassium: 3.8 mmol/L (ref 3.5–5.1)
Sodium: 140 mmol/L (ref 135–145)
Total Bilirubin: 0.5 mg/dL (ref 0.3–1.2)
Total Protein: 6.6 g/dL (ref 6.5–8.1)

## 2018-12-01 LAB — URINE CULTURE: Culture: NO GROWTH

## 2018-12-01 LAB — CBC
HCT: 27.8 % — ABNORMAL LOW (ref 36.0–46.0)
Hemoglobin: 8 g/dL — ABNORMAL LOW (ref 12.0–15.0)
MCH: 27.1 pg (ref 26.0–34.0)
MCHC: 28.8 g/dL — ABNORMAL LOW (ref 30.0–36.0)
MCV: 94.2 fL (ref 80.0–100.0)
Platelets: 262 10*3/uL (ref 150–400)
RBC: 2.95 MIL/uL — ABNORMAL LOW (ref 3.87–5.11)
RDW: 18.3 % — ABNORMAL HIGH (ref 11.5–15.5)
WBC: 5.8 10*3/uL (ref 4.0–10.5)
nRBC: 0 % (ref 0.0–0.2)

## 2018-12-01 NOTE — Discharge Instructions (Signed)
Your hemoglobin is 8 today.   Your chest xray and labs are unchanged since yesterday   Follow up with Family practice clinic   Return to ER if you have worse shortness of breath, rectal bleeding, fever, vomiting.

## 2018-12-01 NOTE — ED Notes (Signed)
Patient Alert and oriented to baseline. Stable and ambulatory to baseline. Patient verbalized understanding of the discharge instructions.  Patient belongings were taken by the patient.   

## 2018-12-01 NOTE — ED Notes (Signed)
Notified pt daughter that she is ready for discharge. Pt daughter states she will be here shortly.

## 2018-12-01 NOTE — ED Provider Notes (Signed)
[facility] DE[facility]MENT Provider Note   CSN: [id number] Arrival date & time: 12/01/18  1834     History   Chief Complaint Chief Complaint  Patient presents with  . Weakness  . Shortness of [name]    HPI [facility] [name] is a 73 y.o. female history of high [name], [facility] [name], Hannah [name], [facility] [name] was noted to have a hemoglobin of 7.6 and chest x-ray and COVID was negative.  Her BNP was about 200 at that time.  Upon discussion with her primary practice doctor, patient did not require emergent transfusion then and was told to follow-up today.  The clinic apparently was overbooked today and patient was told to come [facility] for possible transfusion .  Patient denies any blood in her stool and per the daughter, patient has some shortness of [name] but patient adamantly denies that and wants to go home.      The history is provided by the patient and a relative.    Past Medical History:  Diagnosis Date  . Anemia   . Anxiety   . Arthritis    "hands" (05/09/2015)  . GERD (gastroesophageal reflux disease)   . High [name]   . History of blood transfusion 1960s; 05/08/2015   "when I had my kids; low HgB"  . [facility] [name]   . Osteoarthritis   . Pneumonia 05/2004    Patient Active Problem List   Diagnosis Date Noted  . CHF (congestive heart failure) ([place]) 11/06/2018  . Gastric bleed   . Elevated TSH 02/03/2018  . Dementia due to atherosclerosis with behavioral disturbance ([place]) 02/02/2018  . Renal insufficiency 11/09/2017  . Gout 09/11/2015  . OBESITY 03/26/2010  . Anxiety state 08/09/2008  . Essential [facility] [name] 08/09/2008  . GERD 08/09/2008    Past Surgical History:  Procedure Laterality Date  . BIOPSY  10/28/2018   Procedure: BIOPSY;  Surgeon: Juanita Craver, MD;  Location: [facility] ENDOSCOPY;  Service: Endoscopy;;  . CARDIAC CATHETERIZATION  05/2004  .  COLONOSCOPY  07/2002   Archie Endo 09/02/2010  . COLONOSCOPY WITH PROPOFOL N/A 10/28/2018   Procedure: COLONOSCOPY WITH PROPOFOL;  Surgeon: Juanita Craver, MD;  Location: [facility] ENDOSCOPY;  Service: Endoscopy;  Laterality: N/A;  . ESOPHAGOGASTRODUODENOSCOPY  07/2002   w/biopsies/notes 09/02/2010  . ESOPHAGOGASTRODUODENOSCOPY (EGD) WITH PROPOFOL N/A 10/28/2018   Procedure: ESOPHAGOGASTRODUODENOSCOPY (EGD) WITH PROPOFOL;  Surgeon: Juanita Craver, MD;  Location: [facility] ENDOSCOPY;  Service: Endoscopy;  Laterality: N/A;  . HOT HEMOSTASIS N/A 10/28/2018   Procedure: HOT HEMOSTASIS (ARGON PLASMA COAGULATION/BICAP);  Surgeon: Juanita Craver, MD;  Location: [facility] ENDOSCOPY;  Service: Endoscopy;  Laterality: N/A;  . IR ANGIOGRAM SELECTIVE EACH ADDITIONAL VESSEL  10/30/2018  . IR ANGIOGRAM SELECTIVE EACH ADDITIONAL VESSEL  10/30/2018  . IR ANGIOGRAM SELECTIVE EACH ADDITIONAL VESSEL  10/30/2018  . IR ANGIOGRAM SELECTIVE EACH ADDITIONAL VESSEL  10/30/2018  . IR ANGIOGRAM VISCERAL SELECTIVE  10/30/2018  . IR EMBO ART  VEN HEMORR LYMPH EXTRAV  INC GUIDE ROADMAPPING  10/30/2018  . IR US GUIDANCE  10/30/2018  . POLYPECTOMY  10/28/2018   Procedure: POLYPECTOMY;  Surgeon: Juanita Craver, MD;  Location: [facility] ENDOSCOPY;  Service: Endoscopy;;     OB History   No obstetric history on file.      Home Medications    Prior to Admission medications   Medication Sig Start Date End Date Taking? Authorizing Provider  acetaminophen (TYLENOL) 325 MG tablet Take 650 mg by  mouth every 6 (six) hours as needed for mild pain or headache.    [provider]  atorvastatin (LIPITOR) 40 MG tablet Take 1 tablet (40 mg total) by mouth daily. 05/31/18   Chambliss, Jeb Levering, MD  busPIRone (BUSPAR) 5 MG tablet Take 1 tablet (5 mg total) by mouth 2 (two) times daily as needed. Patient not taking: Reported on 11/30/2018 11/29/18   [name], MD  ferrous sulfate 325 (65 FE) MG tablet Take 325 mg by mouth daily with breakfast.    [provider]  furosemide (LASIX) 40 MG tablet Take 1 tablet (40 mg total) by mouth daily. 11/13/18   Anderson, Chelsey L, DO  metoprolol tartrate (LOPRESSOR) 25 MG tablet Take 0.5 tablets (12.5 mg total) by mouth 2 (two) times daily. 11/12/18   Anderson, Chelsey L, DO  Multiple Vitamins-Minerals (ADULT ONE DAILY GUMMIES) CHEW Chew 1 tablet by mouth daily.    [provider]  omeprazole (PRILOSEC) 20 MG capsule TAKE 1 CAPSULE BY MOUTH DAILY AS NEEDED Patient taking differently: Take 20 mg by mouth daily.  11/29/18   [name], MD  sertraline (ZOLOFT) 50 MG tablet Take 1 tablet (50 mg total) by mouth daily. 11/29/18   [name], MD    Family History Family History  Problem Relation Age of Onset  . Heart disease Mother        died at age 20  . Breast cancer Sister   . Cancer Sister        Breast  . [facility] [name] Father   . [facility] [name] Sister     Social History Social History   Tobacco Use  . Smoking status: Former Smoker    Packs/day: 0.30    Years: 8.00    Pack years: 2.40    Types: Cigarettes    Quit date: 05/30/2004    Years since quitting: 14.5  . Smokeless tobacco: Never Used  Substance Use Topics  . Alcohol use: No    Alcohol/week: 0.0 standard drinks  . Drug use: Never     Allergies   Patient has no known allergies.   Review of Systems Review of Systems  Respiratory: Positive for shortness of [name].   Neurological: Positive for weakness.  All other systems reviewed and are negative.    Physical Exam Updated Vital Signs BP 123/79   Pulse 97   Temp 97.8 F (36.6 C) (Oral)   Resp 18   SpO2 97%   Physical Exam Vitals signs reviewed.  HENT:     Head: Normocephalic.  Eyes:     Pupils: Pupils are equal, round, and reactive to light.  Neck:     Musculoskeletal: Normal range of motion and neck supple.  Cardiovascular:     Rate and Rhythm: Normal rate and regular rhythm.  Pulmonary:     Effort: Pulmonary effort is normal.      [name] sounds: Normal [name] sounds.  Abdominal:     General: Bowel sounds are normal.     Palpations: Abdomen is soft.  Musculoskeletal: Normal range of motion.  Skin:    General: Skin is warm.     Capillary Refill: Capillary refill takes less than 2 seconds.  Neurological:     General: No focal deficit present.     Mental Status: [name] is alert and oriented to person, place, and time.  Psychiatric:        Mood and Affect: Mood normal.        Behavior: Behavior normal.  ED Treatments / Results  Labs (all labs ordered are listed, but only abnormal results are displayed) Labs Reviewed  COMPREHENSIVE METABOLIC PANEL - Abnormal; Notable for the following components:      Result Value   Glucose, Bld 120 (*)    GFR calc non Af Amer 56 (*)    All other components within normal limits  CBC - Abnormal; Notable for the following components:   RBC 2.95 (*)    Hemoglobin 8.0 (*)    HCT 27.8 (*)    MCHC 28.8 (*)    RDW 18.3 (*)    All other components within normal limits  TYPE AND SCREEN    EKG None  Radiology Dg Chest Port 1 View  Result Date: 12/01/2018 CLINICAL DATA:  Shortness of [name] EXAM: PORTABLE CHEST 1 VIEW COMPARISON:  11/30/2018, 11/25/2018 FINDINGS: Probable small right effusion. Stable cardiomediastinal silhouette. Mild basilar interstitial opacity. No pneumothorax. IMPRESSION: 1. Possible small right pleural effusion 2. Borderline cardiomegaly with slight central congestion. Minimal interstitial opacity could reflect edema or interstitial inflammatory process. Findings do not appear significantly changed. Electronically Signed   By: Donavan Foil M.D.   On: 12/01/2018 20:47   Dg Chest Portable 1 View  Result Date: 11/30/2018 CLINICAL DATA:  Shortness of [name] EXAM: PORTABLE CHEST 1 VIEW COMPARISON:  Five days ago FINDINGS: Large lung volumes with symmetric interstitial coarsening and airway cuffing, stable. Mild haziness of the lower chest with possible small  pleural effusions. Heart size and mediastinal contours are stable and normal. IMPRESSION: Interstitial opacity which could be bronchitic or congestive. Suspect trace pleural effusions. Electronically Signed   By: Monte Fantasia M.D.   On: 11/30/2018 04:57    Procedures Procedures (including critical care time)  Medications Ordered in ED Medications - No data to display   Initial Impression / Assessment and Plan / ED Course  I have reviewed the triage vital signs and the nursing notes.  Pertinent labs & imaging results that were available during my care of the patient were reviewed by me and considered in my medical decision making (see chart for details).       SHANDEE JERGENS is a 73 y.o. female [facility] presenting with weakness, possible anemia, shortness of [name]. I reviewed records from yesterday.  Patient had appropriate work-up yesterday and her COVID was negative her BNP was about 200 and her chest x-ray was stable .  Her hemoglobin was 7.6 yesterday.  [name] denies any further bleeding since yesterday.  We will repeat a chest x-ray and basic labs.  9:36 PM Hg is 8. CXR stable. Patient doesn't need transfusion. [name] can be discharged with outpatient follow up. Gave strict return precautions    Final Clinical Impressions(s) / ED Diagnoses   Final diagnoses:  Anemia, unspecified type  Shortness of [name]    ED Discharge Orders    None       Drenda Freeze, MD 12/01/18 2136

## 2018-12-01 NOTE — ED Triage Notes (Signed)
Per [facility], Pt brought in from home. Pt reports generalized weakness, chest tightness and shortness of breath. Pt seen at [facility], Hgb was 6.6, did not receive blood products. EMS reports her hemoccult was positive. She denies blood in stool.

## 2018-12-02 ENCOUNTER — Other Ambulatory Visit: Payer: Self-pay

## 2018-12-02 ENCOUNTER — Telehealth: Payer: Self-pay | Admitting: Family Medicine

## 2018-12-02 ENCOUNTER — Emergency Department (HOSPITAL_COMMUNITY)
Admission: EM | Admit: 2018-12-02 | Discharge: 2018-12-02 | Disposition: A | Discharge status: Home / Self Care | Payer: Medicare Other | Attending: Emergency Medicine | Admitting: Emergency Medicine

## 2018-12-02 ENCOUNTER — Encounter (HOSPITAL_COMMUNITY): Payer: Self-pay | Admitting: Emergency Medicine

## 2018-12-02 ENCOUNTER — Inpatient Hospital Stay: Payer: Medicare Other

## 2018-12-02 ENCOUNTER — Emergency Department (HOSPITAL_COMMUNITY): Payer: Medicare Other

## 2018-12-02 DIAGNOSIS — F411 Generalized anxiety disorder: Secondary | ICD-10-CM

## 2018-12-02 DIAGNOSIS — R42 Dizziness and giddiness: Secondary | ICD-10-CM | POA: Diagnosis not present

## 2018-12-02 DIAGNOSIS — Z79899 Other long term (current) drug therapy: Secondary | ICD-10-CM | POA: Insufficient documentation

## 2018-12-02 DIAGNOSIS — R0602 Shortness of breath: Secondary | ICD-10-CM | POA: Insufficient documentation

## 2018-12-02 DIAGNOSIS — Z87891 Personal history of nicotine dependence: Secondary | ICD-10-CM | POA: Diagnosis not present

## 2018-12-02 DIAGNOSIS — I110 Hypertensive heart disease with heart failure: Secondary | ICD-10-CM | POA: Insufficient documentation

## 2018-12-02 DIAGNOSIS — K922 Gastrointestinal hemorrhage, unspecified: Secondary | ICD-10-CM

## 2018-12-02 DIAGNOSIS — F0391 Unspecified dementia with behavioral disturbance: Secondary | ICD-10-CM | POA: Diagnosis not present

## 2018-12-02 DIAGNOSIS — I509 Heart failure, unspecified: Secondary | ICD-10-CM | POA: Insufficient documentation

## 2018-12-02 DIAGNOSIS — R531 Weakness: Secondary | ICD-10-CM | POA: Diagnosis not present

## 2018-12-02 LAB — CBC WITH DIFFERENTIAL/PLATELET
Abs Immature Granulocytes: 0.01 10*3/uL (ref 0.00–0.07)
Basophils Absolute: 0 10*3/uL (ref 0.0–0.1)
Basophils Relative: 0 %
Eosinophils Absolute: 0.2 10*3/uL (ref 0.0–0.5)
Eosinophils Relative: 3 %
HCT: 27.2 % — ABNORMAL LOW (ref 36.0–46.0)
Hemoglobin: 7.8 g/dL — ABNORMAL LOW (ref 12.0–15.0)
Immature Granulocytes: 0 %
Lymphocytes Relative: 26 %
Lymphs Abs: 1.8 10*3/uL (ref 0.7–4.0)
MCH: 27.2 pg (ref 26.0–34.0)
MCHC: 28.7 g/dL — ABNORMAL LOW (ref 30.0–36.0)
MCV: 94.8 fL (ref 80.0–100.0)
Monocytes Absolute: 0.7 10*3/uL (ref 0.1–1.0)
Monocytes Relative: 10 %
Neutro Abs: 4.1 10*3/uL (ref 1.7–7.7)
Neutrophils Relative %: 61 %
Platelets: 275 10*3/uL (ref 150–400)
RBC: 2.87 MIL/uL — ABNORMAL LOW (ref 3.87–5.11)
RDW: 18 % — ABNORMAL HIGH (ref 11.5–15.5)
WBC: 6.8 10*3/uL (ref 4.0–10.5)
nRBC: 0 % (ref 0.0–0.2)

## 2018-12-02 LAB — BASIC METABOLIC PANEL
Anion gap: 11 (ref 5–15)
BUN: 16 mg/dL (ref 8–23)
CO2: 21 mmol/L — ABNORMAL LOW (ref 22–32)
Calcium: 8.8 mg/dL — ABNORMAL LOW (ref 8.9–10.3)
Chloride: 108 mmol/L (ref 98–111)
Creatinine, Ser: 1.07 mg/dL — ABNORMAL HIGH (ref 0.44–1.00)
GFR calc Af Amer: 59 mL/min — ABNORMAL LOW (ref >60)
GFR calc non Af Amer: 51 mL/min — ABNORMAL LOW (ref >60)
Glucose, Bld: 124 mg/dL — ABNORMAL HIGH (ref 70–99)
Potassium: 4.2 mmol/L (ref 3.5–5.1)
Sodium: 140 mmol/L (ref 135–145)

## 2018-12-02 LAB — TYPE AND SCREEN
ABO/RH(D): O NEG
Antibody Screen: POSITIVE
Unit division: 0
Unit division: 0

## 2018-12-02 LAB — BPAM RBC
Blood Product Expiration Date: 202008202359
Blood Product Expiration Date: 202008222359
ISSUE DATE / TIME: 202008131722
ISSUE DATE / TIME: 202008131722
Unit Type and Rh: 9500
Unit Type and Rh: 9500

## 2018-12-02 LAB — TROPONIN I (HIGH SENSITIVITY): Troponin I (High Sensitivity): 15 ng/L (ref <18)

## 2018-12-02 LAB — BRAIN NATRIURETIC PEPTIDE: B Natriuretic Peptide: 280.7 pg/mL — ABNORMAL HIGH (ref 0.0–100.0)

## 2018-12-02 MED ORDER — FUROSEMIDE 10 MG/ML IJ SOLN
40.0000 mg | Freq: Once | INTRAMUSCULAR | Status: AC
  Administered 2018-12-02: 23:00:00 40 mg via INTRAVENOUS
  Filled 2018-12-02: qty 4

## 2018-12-02 NOTE — ED Provider Notes (Signed)
[place] EMERGENCY DEPARTMENT Provider Note   CSN: [id number] Arrival date & time: 12/02/18  1853    History   Chief Complaint Chief Complaint  Patient presents with  . Shortness of Breath    HPI [facility] [name] is a 73 y.o. female.     HPI  Patient presents via EMS for evaluation of shortness of breath.  She states that upon waking this morning she did not feel well and then around 1 or 2:00 this afternoon she had progressive shortness of breath with sensation of heaviness on her chest.  States that she has had similar symptoms before but she cannot recall what she was diagnosed with or what the etiology was then.  Reports that she is otherwise been well lately.  Denies significant weight gain, edema, orthopnea.  EMS reports hemodynamically stable in route.  No interventions given.  Past Medical History:  Diagnosis Date  . Anemia   . Anxiety   . Arthritis    "hands" (05/09/2015)  . GERD (gastroesophageal reflux disease)   . High cholesterol   . History of blood transfusion 1960s; 05/08/2015   "when I had my kids; low HgB"  . Hypertension   . Osteoarthritis   . Pneumonia 05/2004    Patient Active Problem List   Diagnosis Date Noted  . CHF (congestive heart failure) ([place]) 11/06/2018  . Gastric bleed   . Elevated TSH 02/03/2018  . Dementia due to atherosclerosis with behavioral disturbance ([place]) 02/02/2018  . Renal insufficiency 11/09/2017  . Gout 09/11/2015  . OBESITY 03/26/2010  . Anxiety state 08/09/2008  . Essential hypertension 08/09/2008  . GERD 08/09/2008    Past Surgical History:  Procedure Laterality Date  . BIOPSY  10/28/2018   Procedure: BIOPSY;  Surgeon: Juanita Craver, MD;  Location: [facility] ENDOSCOPY;  Service: Endoscopy;;  . CARDIAC CATHETERIZATION  05/2004  . COLONOSCOPY  07/2002   Archie Endo 09/02/2010  . COLONOSCOPY WITH PROPOFOL N/A 10/28/2018   Procedure: COLONOSCOPY WITH PROPOFOL;  Surgeon: Juanita Craver, MD;  Location: [facility] ENDOSCOPY;   Service: Endoscopy;  Laterality: N/A;  . ESOPHAGOGASTRODUODENOSCOPY  07/2002   w/biopsies/notes 09/02/2010  . ESOPHAGOGASTRODUODENOSCOPY (EGD) WITH PROPOFOL N/A 10/28/2018   Procedure: ESOPHAGOGASTRODUODENOSCOPY (EGD) WITH PROPOFOL;  Surgeon: Juanita Craver, MD;  Location: [facility] ENDOSCOPY;  Service: Endoscopy;  Laterality: N/A;  . HOT HEMOSTASIS N/A 10/28/2018   Procedure: HOT HEMOSTASIS (ARGON PLASMA COAGULATION/BICAP);  Surgeon: Juanita Craver, MD;  Location: [facility] ENDOSCOPY;  Service: Endoscopy;  Laterality: N/A;  . IR ANGIOGRAM SELECTIVE EACH ADDITIONAL VESSEL  10/30/2018  . IR ANGIOGRAM SELECTIVE EACH ADDITIONAL VESSEL  10/30/2018  . IR ANGIOGRAM SELECTIVE EACH ADDITIONAL VESSEL  10/30/2018  . IR ANGIOGRAM SELECTIVE EACH ADDITIONAL VESSEL  10/30/2018  . IR ANGIOGRAM VISCERAL SELECTIVE  10/30/2018  . IR EMBO ART  VEN HEMORR LYMPH EXTRAV  INC GUIDE ROADMAPPING  10/30/2018  . IR US GUIDANCE  10/30/2018  . POLYPECTOMY  10/28/2018   Procedure: POLYPECTOMY;  Surgeon: Juanita Craver, MD;  Location: [facility] ENDOSCOPY;  Service: Endoscopy;;     OB History   No obstetric history on file.      Home Medications    Prior to Admission medications   Medication Sig Start Date End Date Taking? Authorizing Provider  acetaminophen (TYLENOL) 325 MG tablet Take 650 mg by mouth every 6 (six) hours as needed for mild pain or headache.   Yes [provider]  atorvastatin (LIPITOR) 40 MG tablet Take 1 tablet (40 mg total) by mouth daily. 05/31/18  Yes Chambliss, Jeb Levering, MD  busPIRone (BUSPAR) 5 MG tablet Take 5 mg by mouth 2 (two) times daily as needed (anxiety attacks).  12/02/18  Yes Chambliss, Jeb Levering, MD  ferrous sulfate 325 (65 FE) MG tablet Take 325 mg by mouth daily with breakfast.   Yes [provider]  furosemide (LASIX) 40 MG tablet Take 1 tablet (40 mg total) by mouth daily. 11/13/18  Yes [facility], Chelsey L, DO  metoprolol tartrate (LOPRESSOR) 25 MG tablet Take 0.5 tablets (12.5 mg total) by mouth  2 (two) times daily. 11/12/18  Yes [facility], Chelsey L, DO  Multiple Vitamins-Minerals (ADULT ONE DAILY GUMMIES) CHEW Chew 1 tablet by mouth daily.   Yes [provider]  omeprazole (PRILOSEC) 20 MG capsule TAKE 1 CAPSULE BY MOUTH DAILY AS NEEDED Patient taking differently: Take 20 mg by mouth at bedtime.  11/29/18  Yes [name], MD  sertraline (ZOLOFT) 50 MG tablet Take 100 mg by mouth daily. 11/29/18  Yes Chambliss, Jeb Levering, MD    Family History Family History  Problem Relation Age of Onset  . Heart disease Mother        died at age 13  . Breast cancer Sister   . Cancer Sister        Breast  . Hypertension Father   . Hypertension Sister     Social History Social History   Tobacco Use  . Smoking status: Former Smoker    Packs/day: 0.30    Years: 8.00    Pack years: 2.40    Types: Cigarettes    Quit date: 05/30/2004    Years since quitting: 14.5  . Smokeless tobacco: Never Used  Substance Use Topics  . Alcohol use: No    Alcohol/week: 0.0 standard drinks  . Drug use: Never     Allergies   Patient has no known allergies.   Review of Systems Review of Systems  Constitutional: Negative for chills and fever.  HENT: Negative for ear pain and sore throat.   Eyes: Negative for pain and visual disturbance.  Respiratory: Positive for chest tightness and shortness of breath. Negative for cough.   Cardiovascular: Negative for chest pain and palpitations.  Gastrointestinal: Negative for abdominal pain and vomiting.  Genitourinary: Negative for dysuria and hematuria.  Musculoskeletal: Negative for arthralgias and back pain.  Skin: Negative for color change and rash.  Neurological: Negative for seizures and syncope.  All other systems reviewed and are negative.    Physical Exam Updated Vital Signs BP (!) 151/55   Pulse 94   Temp 98.9 F (37.2 C) (Oral)   Resp 17   Ht 5\' 2"  (1.575 m)   Wt 76 kg   SpO2 99%   BMI 30.65 kg/m   Physical Exam  Vitals signs and nursing note reviewed.  Constitutional:      General: She is not in acute distress.    Appearance: She is well-developed.  HENT:     Head: Normocephalic and atraumatic.  Eyes:     Conjunctiva/sclera: Conjunctivae normal.  Neck:     Musculoskeletal: Neck supple.  Cardiovascular:     Rate and Rhythm: Normal rate and regular rhythm.     Heart sounds: No murmur.  Pulmonary:     Effort: Pulmonary effort is normal. No respiratory distress.     Breath sounds: Normal breath sounds.  Abdominal:     Palpations: Abdomen is soft.     Tenderness: There is no abdominal tenderness.  Musculoskeletal:  Right lower leg: No edema.     Left lower leg: No edema.  Skin:    General: Skin is warm and dry.     Coloration: Skin is pale.  Neurological:     Mental Status: She is alert.  Psychiatric:        Mood and Affect: Mood normal.      ED Treatments / Results  Labs (all labs ordered are listed, but only abnormal results are displayed) Labs Reviewed  BASIC METABOLIC PANEL - Abnormal; Notable for the following components:      Result Value   CO2 21 (*)    Glucose, Bld 124 (*)    Creatinine, Ser 1.07 (*)    Calcium 8.8 (*)    GFR calc non Af Amer 51 (*)    GFR calc Af Amer 59 (*)    All other components within normal limits  CBC WITH DIFFERENTIAL/PLATELET - Abnormal; Notable for the following components:   RBC 2.87 (*)    Hemoglobin 7.8 (*)    HCT 27.2 (*)    MCHC 28.7 (*)    RDW 18.0 (*)    All other components within normal limits  BRAIN NATRIURETIC PEPTIDE - Abnormal; Notable for the following components:   B Natriuretic Peptide 280.7 (*)    All other components within normal limits  TROPONIN I (HIGH SENSITIVITY)  TROPONIN I (HIGH SENSITIVITY)    EKG None  Radiology Dg Chest Port 1 View  Result Date: 12/02/2018 CLINICAL DATA:  Shortness of breath, dizziness, weakness. EXAM: PORTABLE CHEST 1 VIEW COMPARISON:  Radiograph yesterday, as well as additional  priors. FINDINGS: Mild cardiomegaly is unchanged. Aortic atherosclerosis. Small right pleural effusion unchanged. Vascular congestion with mild interstitial prominence and Kerley B-lines suggesting mild pulmonary edema. Subsegmental atelectasis in both lung bases. No confluent airspace disease. No pneumothorax. IMPRESSION: Mild CHF. Cardiomegaly with small right pleural effusion and mild interstitial edema. Aortic Atherosclerosis (ICD10-I70.0). Electronically Signed   By: Keith Rake M.D.   On: 12/02/2018 20:08   Dg Chest Port 1 View  Result Date: 12/01/2018 CLINICAL DATA:  Shortness of breath EXAM: PORTABLE CHEST 1 VIEW COMPARISON:  11/30/2018, 11/25/2018 FINDINGS: Probable small right effusion. Stable cardiomediastinal silhouette. Mild basilar interstitial opacity. No pneumothorax. IMPRESSION: 1. Possible small right pleural effusion 2. Borderline cardiomegaly with slight central congestion. Minimal interstitial opacity could reflect edema or interstitial inflammatory process. Findings do not appear significantly changed. Electronically Signed   By: Donavan Foil M.D.   On: 12/01/2018 20:47    Procedures Procedures (including critical care time)  Medications Ordered in ED Medications  furosemide (LASIX) injection 40 mg (40 mg Intravenous Given 12/02/18 2244)     Initial Impression / Assessment and Plan / ED Course  I have reviewed the triage vital signs and the nursing notes.  Pertinent labs & imaging results that were available during my care of the patient were reviewed by me and considered in my medical decision making (see chart for details).       [facility] [name] is a 73 year old female with a history of dementia and was hospitalized last month for flash pulmonary edema though echo showed normal ejection fraction.  I reviewed her medical records.  Notably, this is her fourth visit within the last 8 days.  She has stable normocytic anemia redemonstrated today.  Her primary concern was  shortness of breath.  BMP was elevated at 280 though she does not appear volume up on exam.  Gave 1 dose of IV Lasix.  No significant electrolyte abnormalities, AKI, or leukocytosis.  No review of systems or exam findings to suggest infection.  Chest x-ray shows small right pleural effusion and mild interstitial edema.  I doubt that she has pneumonia.  COVID was negative within the last week so I will not retest.  For her heart, EKG negative for acute ischemic findings.  Troponin was normal x1.  Was going to hold her for additional troponin but she requested be discharged, stating that she felt fine, did not think anything was wrong with her, and did not think that she needed additional treatment.  I spoke with her more and upon further questioning it is clear that she has some memory deficits.  She is unable to tell me the day of the week or the month.  She knows it is 2020 and is able to name the president.  Was not sure what city she was in but knew she was in [place].  Did not recall being hospitalized last month and did not realize that she had been in the emergency department just yesterday.  Other remote memory is good.  Overall findings are consistent with dementia.  Given this, I do not think that she is appropriate for shared decision-making on her own.  I called and discussed findings with her daughter including her desire to leave.  I doubt that she has had an MI, and I do not think that it is entirely unreasonable to forego the second test if they have strong preference to.  Her daughter [facility] [name] vocalized understanding and preferred discharge as well as assumption of risk related to discharge prior to completion of work-up.  I reviewed that the patient's symptoms may be in part due to heart failure and that it was important for her to take her diuretics, but that a component of her multiple visits may be due to dementia and encouraged her to follow-up with her mother's PCP regarding further  screening.  Her daughter arrived to provide transportation. The patient also vocalized understanding and agreed with plan.  They had no other concerns and the patient was discharged in good condition with strict return precautions and aftercare instructions.  Final Clinical Impressions(s) / ED Diagnoses   Final diagnoses:  Shortness of breath  Chronic congestive heart failure, unspecified heart failure type [facility])  Dementia with behavioral disturbance, unspecified dementia type [facility])    ED Discharge Orders    None       Tillie Fantasia, MD 12/02/18 2324    Tegeler, Gwenyth Allegra, MD 12/03/18 [phone]

## 2018-12-02 NOTE — Assessment & Plan Note (Signed)
Hgb has been stable in ER visits Get cbc in 2 weeks

## 2018-12-02 NOTE — Assessment & Plan Note (Signed)
Increase zoloft to 2 tabs daily

## 2018-12-02 NOTE — ED Triage Notes (Signed)
Pt transported from home by [facility] for shob, dizziness and weakness. Pt seen for same recently.  Covid neg, low hbg.  Pt does have hx of dementia, call daughter [phone] IV est by EMS.

## 2018-12-02 NOTE — Telephone Encounter (Signed)
Spoke with Daughter came up with following plan  If patient has episode of shortness of breath or chest pain or distress they will check her pulse ox   If < 85 or she seems to lose cx they will cal 911  Other wise will treat as panic attack and give buspar up to 2 caps four times a day and try to calm and redirect her.  Will today increase her Zoloft to 100 mg daily  Daughter will call if above not working or if she has leg swelling or consistent worsening fatigue  Get cbc in 2 weeks

## 2018-12-05 ENCOUNTER — Other Ambulatory Visit: Payer: Self-pay | Admitting: Student in an Organized Health Care Education/Training Program

## 2018-12-06 ENCOUNTER — Other Ambulatory Visit: Payer: Self-pay

## 2018-12-06 ENCOUNTER — Emergency Department (HOSPITAL_COMMUNITY): Payer: Medicare Other

## 2018-12-06 ENCOUNTER — Encounter (HOSPITAL_COMMUNITY): Payer: Self-pay

## 2018-12-06 ENCOUNTER — Emergency Department (HOSPITAL_COMMUNITY)
Admission: EM | Admit: 2018-12-06 | Discharge: 2018-12-06 | Disposition: A | Discharge status: Home / Self Care | Payer: Medicare Other | Attending: Emergency Medicine | Admitting: Emergency Medicine

## 2018-12-06 DIAGNOSIS — I509 Heart failure, unspecified: Secondary | ICD-10-CM | POA: Diagnosis not present

## 2018-12-06 DIAGNOSIS — Z87891 Personal history of nicotine dependence: Secondary | ICD-10-CM | POA: Insufficient documentation

## 2018-12-06 DIAGNOSIS — R600 Localized edema: Secondary | ICD-10-CM | POA: Insufficient documentation

## 2018-12-06 DIAGNOSIS — R064 Hyperventilation: Secondary | ICD-10-CM | POA: Diagnosis not present

## 2018-12-06 DIAGNOSIS — I110 Hypertensive heart disease with heart failure: Secondary | ICD-10-CM | POA: Insufficient documentation

## 2018-12-06 DIAGNOSIS — R000 Tachycardia, unspecified: Secondary | ICD-10-CM | POA: Diagnosis not present

## 2018-12-06 DIAGNOSIS — Z79899 Other long term (current) drug therapy: Secondary | ICD-10-CM | POA: Insufficient documentation

## 2018-12-06 DIAGNOSIS — R0602 Shortness of breath: Secondary | ICD-10-CM

## 2018-12-06 LAB — COMPREHENSIVE METABOLIC PANEL
ALT: 11 U/L (ref 0–44)
AST: 17 U/L (ref 15–41)
Albumin: 3.6 g/dL (ref 3.5–5.0)
Alkaline Phosphatase: 100 U/L (ref 38–126)
Anion gap: 10 (ref 5–15)
BUN: 21 mg/dL (ref 8–23)
CO2: 21 mmol/L — ABNORMAL LOW (ref 22–32)
Calcium: 8.7 mg/dL — ABNORMAL LOW (ref 8.9–10.3)
Chloride: 109 mmol/L (ref 98–111)
Creatinine, Ser: 1.13 mg/dL — ABNORMAL HIGH (ref 0.44–1.00)
GFR calc Af Amer: 55 mL/min — ABNORMAL LOW (ref >60)
GFR calc non Af Amer: 48 mL/min — ABNORMAL LOW (ref >60)
Glucose, Bld: 128 mg/dL — ABNORMAL HIGH (ref 70–99)
Potassium: 4.3 mmol/L (ref 3.5–5.1)
Sodium: 140 mmol/L (ref 135–145)
Total Bilirubin: 0.6 mg/dL (ref 0.3–1.2)
Total Protein: 6.6 g/dL (ref 6.5–8.1)

## 2018-12-06 LAB — CBC WITH DIFFERENTIAL/PLATELET
Abs Immature Granulocytes: 0.03 10*3/uL (ref 0.00–0.07)
Basophils Absolute: 0 10*3/uL (ref 0.0–0.1)
Basophils Relative: 1 %
Eosinophils Absolute: 0 10*3/uL (ref 0.0–0.5)
Eosinophils Relative: 0 %
HCT: 27.3 % — ABNORMAL LOW (ref 36.0–46.0)
Hemoglobin: 7.8 g/dL — ABNORMAL LOW (ref 12.0–15.0)
Immature Granulocytes: 0 %
Lymphocytes Relative: 24 %
Lymphs Abs: 1.7 10*3/uL (ref 0.7–4.0)
MCH: 27.2 pg (ref 26.0–34.0)
MCHC: 28.6 g/dL — ABNORMAL LOW (ref 30.0–36.0)
MCV: 95.1 fL (ref 80.0–100.0)
Monocytes Absolute: 0.6 10*3/uL (ref 0.1–1.0)
Monocytes Relative: 8 %
Neutro Abs: 4.8 10*3/uL (ref 1.7–7.7)
Neutrophils Relative %: 67 %
Platelets: 268 10*3/uL (ref 150–400)
RBC: 2.87 MIL/uL — ABNORMAL LOW (ref 3.87–5.11)
RDW: 17.2 % — ABNORMAL HIGH (ref 11.5–15.5)
WBC: 7.1 10*3/uL (ref 4.0–10.5)
nRBC: 0 % (ref 0.0–0.2)

## 2018-12-06 LAB — TROPONIN I (HIGH SENSITIVITY)
Troponin I (High Sensitivity): 17 ng/L (ref <18)
Troponin I (High Sensitivity): 20 ng/L — ABNORMAL HIGH (ref <18)

## 2018-12-06 LAB — BRAIN NATRIURETIC PEPTIDE: B Natriuretic Peptide: 294.7 pg/mL — ABNORMAL HIGH (ref 0.0–100.0)

## 2018-12-06 MED ORDER — FUROSEMIDE 10 MG/ML IJ SOLN
40.0000 mg | INTRAMUSCULAR | Status: AC
  Administered 2018-12-06: 40 mg via INTRAVENOUS
  Filled 2018-12-06: qty 4

## 2018-12-06 NOTE — ED Notes (Signed)
Pt continues to be confused on place and situation. Very alert and watching TV

## 2018-12-06 NOTE — ED Notes (Signed)
[place] pts daughter wants a pt update

## 2018-12-06 NOTE — ED Triage Notes (Signed)
Ems reports pt has called out frequently for assistance. Upon arrival pt was anxious and says she had a nightmare and some SOB. Pt was recently here for CHF. Dementia, anxiety, HTN history. 158/86BP, 110pulse, 22RR, 98% Ra, 151cbg, 97.8*f

## 2018-12-06 NOTE — ED Notes (Signed)
Pt oob to bathroom with steady gait. 

## 2018-12-06 NOTE — ED Provider Notes (Signed)
[facility] EMERGENCY DEPARTMENT Provider Note   CSN: [id number] Arrival date & time: 12/06/18  0235     History   Chief Complaint Chief Complaint  Patient presents with  . Shortness of Breath    HPI Hannah [name] is a 73 y.o. female.     Patient with past medical history remarkable for CHF, hypertension, anxiety, and anemia presents to the emergency department with a chief complaint of shortness of breath.  She reports associated cough, which she says is nonproductive and nonbloody.  She reports having been seen recently for the same, but is unable to say when she was seen.  Old records reviewed show that this is her fourth visit in the past 7 days.  She states that her shortness of breath is worsening.  She denies any chest pain, back pain, abdominal pain, nausea, vomiting, or diarrhea.  Denies any black or bloody stools.  Denies being anticoagulated.  Denies any treatments prior to arrival.  She states that she does not take fluid pill, however she has Lasix on her medication list.  The history is provided by the patient. No language interpreter was used.    Past Medical History:  Diagnosis Date  . Anemia   . Anxiety   . Arthritis    "hands" (05/09/2015)  . GERD (gastroesophageal reflux disease)   . High cholesterol   . History of blood transfusion 1960s; 05/08/2015   "when I had my kids; low HgB"  . Hypertension   . Osteoarthritis   . Pneumonia 05/2004    Patient Active Problem List   Diagnosis Date Noted  . CHF (congestive heart failure) ([place]) 11/06/2018  . Gastric bleed   . Elevated TSH 02/03/2018  . Dementia due to atherosclerosis with behavioral disturbance ([place]) 02/02/2018  . Renal insufficiency 11/09/2017  . Gout 09/11/2015  . OBESITY 03/26/2010  . Anxiety state 08/09/2008  . Essential hypertension 08/09/2008  . GERD 08/09/2008    Past Surgical History:  Procedure Laterality Date  . BIOPSY  10/28/2018   Procedure: BIOPSY;  Surgeon:  Juanita Craver, MD;  Location: [facility] ENDOSCOPY;  Service: Endoscopy;;  . CARDIAC CATHETERIZATION  05/2004  . COLONOSCOPY  07/2002   Archie Endo 09/02/2010  . COLONOSCOPY WITH PROPOFOL N/A 10/28/2018   Procedure: COLONOSCOPY WITH PROPOFOL;  Surgeon: Juanita Craver, MD;  Location: [facility] ENDOSCOPY;  Service: Endoscopy;  Laterality: N/A;  . ESOPHAGOGASTRODUODENOSCOPY  07/2002   w/biopsies/notes 09/02/2010  . ESOPHAGOGASTRODUODENOSCOPY (EGD) WITH PROPOFOL N/A 10/28/2018   Procedure: ESOPHAGOGASTRODUODENOSCOPY (EGD) WITH PROPOFOL;  Surgeon: Juanita Craver, MD;  Location: [facility] ENDOSCOPY;  Service: Endoscopy;  Laterality: N/A;  . HOT HEMOSTASIS N/A 10/28/2018   Procedure: HOT HEMOSTASIS (ARGON PLASMA COAGULATION/BICAP);  Surgeon: Juanita Craver, MD;  Location: [facility] ENDOSCOPY;  Service: Endoscopy;  Laterality: N/A;  . IR ANGIOGRAM SELECTIVE EACH ADDITIONAL VESSEL  10/30/2018  . IR ANGIOGRAM SELECTIVE EACH ADDITIONAL VESSEL  10/30/2018  . IR ANGIOGRAM SELECTIVE EACH ADDITIONAL VESSEL  10/30/2018  . IR ANGIOGRAM SELECTIVE EACH ADDITIONAL VESSEL  10/30/2018  . IR ANGIOGRAM VISCERAL SELECTIVE  10/30/2018  . IR EMBO ART  VEN HEMORR LYMPH EXTRAV  INC GUIDE ROADMAPPING  10/30/2018  . IR US GUIDANCE  10/30/2018  . POLYPECTOMY  10/28/2018   Procedure: POLYPECTOMY;  Surgeon: Juanita Craver, MD;  Location: [facility] ENDOSCOPY;  Service: Endoscopy;;     OB History   No obstetric history on file.      Home Medications    Prior to Admission medications   Medication Sig  Start Date End Date Taking? Authorizing Provider  acetaminophen (TYLENOL) 325 MG tablet Take 650 mg by mouth every 6 (six) hours as needed for mild pain or headache.    [provider]  atorvastatin (LIPITOR) 40 MG tablet Take 1 tablet (40 mg total) by mouth daily. 05/31/18   Chambliss, Jeb Levering, MD  busPIRone (BUSPAR) 5 MG tablet Take 5 mg by mouth 2 (two) times daily as needed (anxiety attacks).  12/02/18   [name], MD  ferrous sulfate 325 (65 FE) MG tablet  Take 325 mg by mouth daily with breakfast.    [provider]  furosemide (LASIX) 40 MG tablet TAKE 1 TABLET BY MOUTH EVERY DAY 12/05/18   [name], MD  metoprolol tartrate (LOPRESSOR) 25 MG tablet Take 0.5 tablets (12.5 mg total) by mouth 2 (two) times daily. 11/12/18   Anderson, Chelsey L, DO  Multiple Vitamins-Minerals (ADULT ONE DAILY GUMMIES) CHEW Chew 1 tablet by mouth daily.    [provider]  omeprazole (PRILOSEC) 20 MG capsule TAKE 1 CAPSULE BY MOUTH DAILY AS NEEDED Patient taking differently: Take 20 mg by mouth at bedtime.  11/29/18   [name], MD  sertraline (ZOLOFT) 50 MG tablet Take 100 mg by mouth daily. 11/29/18   [name], MD    Family History Family History  Problem Relation Age of Onset  . Heart disease Mother        died at age 100  . Breast cancer Sister   . Cancer Sister        Breast  . Hypertension Father   . Hypertension Sister     Social History Social History   Tobacco Use  . Smoking status: Former Smoker    Packs/day: 0.30    Years: 8.00    Pack years: 2.40    Types: Cigarettes    Quit date: 05/30/2004    Years since quitting: 14.5  . Smokeless tobacco: Never Used  Substance Use Topics  . Alcohol use: No    Alcohol/week: 0.0 standard drinks  . Drug use: Never     Allergies   Patient has no known allergies.   Review of Systems Review of Systems  All other systems reviewed and are negative.    Physical Exam Updated Vital Signs BP (!) 156/69   Pulse (!) 110   Ht 5\' 2"  (1.575 m)   Wt 72.6 kg   SpO2 98%   BMI 29.26 kg/m   Physical Exam Vitals signs and nursing note reviewed.  Constitutional:      General: She is not in acute distress.    Appearance: She is well-developed.  HENT:     Head: Normocephalic and atraumatic.  Eyes:     Conjunctiva/sclera: Conjunctivae normal.  Neck:     Musculoskeletal: Neck supple.  Cardiovascular:     Rate and Rhythm: Regular rhythm.  Tachycardia present.     Heart sounds: No murmur.  Pulmonary:     Effort: Pulmonary effort is normal. No respiratory distress.     Breath sounds: Normal breath sounds.  Abdominal:     Palpations: Abdomen is soft.     Tenderness: There is no abdominal tenderness.  Musculoskeletal:     Comments: Trace pitting edema in lower extremities  Skin:    General: Skin is warm and dry.  Neurological:     Mental Status: She is alert and oriented to person, place, and time.  Psychiatric:        Mood  and Affect: Mood normal.        Behavior: Behavior normal.      ED Treatments / Results  Labs (all labs ordered are listed, but only abnormal results are displayed) Labs Reviewed  CBC WITH DIFFERENTIAL/PLATELET - Abnormal; Notable for the following components:      Result Value   RBC 2.87 (*)    Hemoglobin 7.8 (*)    HCT 27.3 (*)    MCHC 28.6 (*)    RDW 17.2 (*)    All other components within normal limits  COMPREHENSIVE METABOLIC PANEL - Abnormal; Notable for the following components:   CO2 21 (*)    Glucose, Bld 128 (*)    Creatinine, Ser 1.13 (*)    Calcium 8.7 (*)    GFR calc non Af Amer 48 (*)    GFR calc Af Amer 55 (*)    All other components within normal limits  BRAIN NATRIURETIC PEPTIDE - Abnormal; Notable for the following components:   B Natriuretic Peptide 294.7 (*)    All other components within normal limits  TROPONIN I (HIGH SENSITIVITY) - Abnormal; Notable for the following components:   Troponin I (High Sensitivity) 20 (*)    All other components within normal limits  TROPONIN I (HIGH SENSITIVITY)    EKG None  Radiology Dg Chest 2 View  Result Date: 12/06/2018 CLINICAL DATA:  Shortness of breath, recent admission for CHF EXAM: CHEST - 2 VIEW COMPARISON:  Radiograph December 02, 2018, CTA November 06, 2018 FINDINGS: Increasing basilar predominant interstitial opacities with fissural thickening and interlobular septal lines. No pneumothorax. Trace bilateral effusions.  The aorta is calcified. The remaining cardiomediastinal contours are unremarkable. Degenerative changes are present in the imaged spine and shoulders. No acute osseous or soft tissue abnormality. IMPRESSION: Features of interstitial edema with trace bilateral effusions Electronically Signed   By: Lovena Le M.D.   On: 12/06/2018 03:15    Procedures Procedures (including critical care time)  Medications Ordered in ED Medications - No data to display   Initial Impression / Assessment and Plan / ED Course  I have reviewed the triage vital signs and the nursing notes.  Pertinent labs & imaging results that were available during my care of the patient were reviewed by me and considered in my medical decision making (see chart for details).        Patient with shortness of breath.  She apparently awoke from a nightmare stating that she was short of breath.  She has been seen several times in the past week for the same.  Laboratory work-up is about baseline.  She is not hypoxic.   She did have some interstitial edema on her chest x-ray, but has no signs of overt fluid overload on physical exam.  Her lungs do not sound wet.  She has no wheezing.  She was given a dose of Lasix in the ED.  Patient ambulates maintaining greater than 95% pulse oxygenation.  Social work consulted, as patient may benefit from assisted living.  Patient seen by and discussed with Dr. Dayna Barker.  Final Clinical Impressions(s) / ED Diagnoses   Final diagnoses:  SOB (shortness of breath)    ED Discharge Orders    None       Montine Circle, PA-C 12/06/18 1610    Mesner, Corene Cornea, MD 12/07/18 0028

## 2018-12-06 NOTE — ED Notes (Signed)
Pt oob to bathroom with steady gait. Redirected back to room. Calm and cooperative. Prefers to remain seated in WC.

## 2018-12-06 NOTE — Discharge Instructions (Addendum)
You need to contact your doctor.  You may benefit from assisted living.  Our social worker will contact you to give you some additional information.

## 2018-12-06 NOTE — ED Notes (Signed)
DC instructions discussed with pt and printed report given.

## 2018-12-06 NOTE — ED Notes (Signed)
Ambulated Pt with Pulse Ox in Hallway. Pt started at 100% on Room Air. Pt stayed between 96%-100% while ambulating.

## 2018-12-07 ENCOUNTER — Other Ambulatory Visit: Payer: Self-pay

## 2018-12-07 NOTE — Patient Outreach (Signed)
[facility]) Care Management  12/07/2018  Hannah [name] [birth year] [id number]  TELEPHONE SCREENING Referral date: 12/06/18 Referral source: utilization management  Referral reason: Frequent ED visits Insurance: [employer]  Outreach attempt #1  Telephone call to patient regarding utilization management referral. Unable to reach patient or leave voice mail message.  Message states mailbox not accepting messages.  Mailbox has not been set up.  Telephone call to patients listed emergency contact, [name]. Unable to reach. HIPAA compliant voice message left with call back phone number.   PLAN:  RNCM will attempt 2nd telephone outreach to patient within 4 business days.  RNCM will send outreach letter to attempt contact.    Quinn Plowman RN,BSN,CCM [facility] Telephonic  [phone]

## 2018-12-08 ENCOUNTER — Other Ambulatory Visit: Payer: Self-pay

## 2018-12-08 NOTE — Patient Outreach (Signed)
[facility]) Care Management  12/08/2018  Hannah [name] March 03, 1946 [id number]  Medication Adherence call to Hannah [name] HIPPA Compliant Voice message left with a call back number. Hannah [name] is showing past due on Lisinopril 2.5 mg under [place].   [facility] Management Direct Dial [phone]  Fax [phone] [email or web address]

## 2018-12-09 ENCOUNTER — Ambulatory Visit: Payer: Self-pay

## 2018-12-09 ENCOUNTER — Emergency Department (HOSPITAL_COMMUNITY): Payer: Medicare Other

## 2018-12-09 ENCOUNTER — Other Ambulatory Visit: Payer: Self-pay

## 2018-12-09 ENCOUNTER — Encounter (HOSPITAL_COMMUNITY): Payer: Self-pay | Admitting: *Deleted

## 2018-12-09 ENCOUNTER — Observation Stay (HOSPITAL_COMMUNITY)
Admission: EM | Admit: 2018-12-09 | Discharge: 2018-12-10 | Disposition: A | Discharge status: Home / Self Care | Payer: Medicare Other | Attending: Family Medicine | Admitting: Family Medicine

## 2018-12-09 DIAGNOSIS — D649 Anemia, unspecified: Principal | ICD-10-CM | POA: Insufficient documentation

## 2018-12-09 DIAGNOSIS — I509 Heart failure, unspecified: Secondary | ICD-10-CM | POA: Insufficient documentation

## 2018-12-09 DIAGNOSIS — Z20828 Contact with and (suspected) exposure to other viral communicable diseases: Secondary | ICD-10-CM | POA: Insufficient documentation

## 2018-12-09 DIAGNOSIS — I959 Hypotension, unspecified: Secondary | ICD-10-CM | POA: Diagnosis not present

## 2018-12-09 DIAGNOSIS — I10 Essential (primary) hypertension: Secondary | ICD-10-CM | POA: Diagnosis not present

## 2018-12-09 DIAGNOSIS — J810 Acute pulmonary edema: Secondary | ICD-10-CM | POA: Diagnosis not present

## 2018-12-09 DIAGNOSIS — F0390 Unspecified dementia without behavioral disturbance: Secondary | ICD-10-CM | POA: Diagnosis not present

## 2018-12-09 DIAGNOSIS — R404 Transient alteration of awareness: Secondary | ICD-10-CM | POA: Diagnosis not present

## 2018-12-09 DIAGNOSIS — R0602 Shortness of breath: Secondary | ICD-10-CM

## 2018-12-09 DIAGNOSIS — F419 Anxiety disorder, unspecified: Secondary | ICD-10-CM | POA: Diagnosis not present

## 2018-12-09 DIAGNOSIS — E785 Hyperlipidemia, unspecified: Secondary | ICD-10-CM | POA: Insufficient documentation

## 2018-12-09 DIAGNOSIS — Z79899 Other long term (current) drug therapy: Secondary | ICD-10-CM | POA: Insufficient documentation

## 2018-12-09 DIAGNOSIS — M6281 Muscle weakness (generalized): Secondary | ICD-10-CM | POA: Insufficient documentation

## 2018-12-09 DIAGNOSIS — R918 Other nonspecific abnormal finding of lung field: Secondary | ICD-10-CM | POA: Diagnosis not present

## 2018-12-09 DIAGNOSIS — N179 Acute kidney failure, unspecified: Secondary | ICD-10-CM | POA: Insufficient documentation

## 2018-12-09 DIAGNOSIS — R079 Chest pain, unspecified: Secondary | ICD-10-CM | POA: Diagnosis present

## 2018-12-09 DIAGNOSIS — I110 Hypertensive heart disease with heart failure: Secondary | ICD-10-CM | POA: Insufficient documentation

## 2018-12-09 DIAGNOSIS — R0689 Other abnormalities of breathing: Secondary | ICD-10-CM | POA: Diagnosis not present

## 2018-12-09 DIAGNOSIS — D539 Nutritional anemia, unspecified: Secondary | ICD-10-CM

## 2018-12-09 DIAGNOSIS — Z87891 Personal history of nicotine dependence: Secondary | ICD-10-CM | POA: Insufficient documentation

## 2018-12-09 LAB — BASIC METABOLIC PANEL
Anion gap: 12 (ref 5–15)
BUN: 37 mg/dL — ABNORMAL HIGH (ref 8–23)
CO2: 24 mmol/L (ref 22–32)
Calcium: 8.1 mg/dL — ABNORMAL LOW (ref 8.9–10.3)
Chloride: 102 mmol/L (ref 98–111)
Creatinine, Ser: 1.63 mg/dL — ABNORMAL HIGH (ref 0.44–1.00)
GFR calc Af Amer: 35 mL/min — ABNORMAL LOW (ref >60)
GFR calc non Af Amer: 31 mL/min — ABNORMAL LOW (ref >60)
Glucose, Bld: 137 mg/dL — ABNORMAL HIGH (ref 70–99)
Potassium: 3.4 mmol/L — ABNORMAL LOW (ref 3.5–5.1)
Sodium: 138 mmol/L (ref 135–145)

## 2018-12-09 LAB — MAGNESIUM: Magnesium: 1.5 mg/dL — ABNORMAL LOW (ref 1.7–2.4)

## 2018-12-09 LAB — LIPID PANEL
Cholesterol: 127 mg/dL (ref 0–200)
HDL: 55 mg/dL (ref >40)
LDL Cholesterol: 63 mg/dL (ref 0–99)
Total CHOL/HDL Ratio: 2.3 RATIO
Triglycerides: 44 mg/dL (ref <150)
VLDL: 9 mg/dL (ref 0–40)

## 2018-12-09 LAB — CBC
HCT: 24.7 % — ABNORMAL LOW (ref 36.0–46.0)
Hemoglobin: 7.1 g/dL — ABNORMAL LOW (ref 12.0–15.0)
MCH: 26.6 pg (ref 26.0–34.0)
MCHC: 28.7 g/dL — ABNORMAL LOW (ref 30.0–36.0)
MCV: 92.5 fL (ref 80.0–100.0)
Platelets: 309 10*3/uL (ref 150–400)
RBC: 2.67 MIL/uL — ABNORMAL LOW (ref 3.87–5.11)
RDW: 17 % — ABNORMAL HIGH (ref 11.5–15.5)
WBC: 8.4 10*3/uL (ref 4.0–10.5)
nRBC: 0 % (ref 0.0–0.2)

## 2018-12-09 LAB — IRON AND TIBC
Iron: 13 ug/dL — ABNORMAL LOW (ref 28–170)
Saturation Ratios: 3 % — ABNORMAL LOW (ref 10.4–31.8)
TIBC: 440 ug/dL (ref 250–450)
UIBC: 427 ug/dL

## 2018-12-09 LAB — SARS CORONAVIRUS 2 BY RT PCR (HOSPITAL ORDER, PERFORMED IN ~~LOC~~ HOSPITAL LAB): SARS Coronavirus 2: NEGATIVE

## 2018-12-09 LAB — FOLATE: Folate: 14.9 ng/mL (ref >5.9)

## 2018-12-09 LAB — TROPONIN I (HIGH SENSITIVITY)
Troponin I (High Sensitivity): 14 ng/L (ref <18)
Troponin I (High Sensitivity): 14 ng/L (ref <18)
Troponin I (High Sensitivity): 14 ng/L (ref <18)
Troponin I (High Sensitivity): 14 ng/L (ref <18)

## 2018-12-09 LAB — FERRITIN: Ferritin: 10 ng/mL — ABNORMAL LOW (ref 11–307)

## 2018-12-09 LAB — TSH: TSH: 4.175 u[IU]/mL (ref 0.350–4.500)

## 2018-12-09 LAB — RETICULOCYTES
Immature Retic Fract: 36.5 % — ABNORMAL HIGH (ref 2.3–15.9)
RBC.: 2.67 MIL/uL — ABNORMAL LOW (ref 3.87–5.11)
Retic Count, Absolute: 75.6 10*3/uL (ref 19.0–186.0)
Retic Ct Pct: 2.8 % (ref 0.4–3.1)

## 2018-12-09 LAB — VITAMIN B12: Vitamin B-12: 121 pg/mL — ABNORMAL LOW (ref 180–914)

## 2018-12-09 LAB — BRAIN NATRIURETIC PEPTIDE: B Natriuretic Peptide: 260.5 pg/mL — ABNORMAL HIGH (ref 0.0–100.0)

## 2018-12-09 MED ORDER — SODIUM CHLORIDE 0.9 % IV BOLUS
500.0000 mL | Freq: Once | INTRAVENOUS | Status: AC
  Administered 2018-12-09: 500 mL via INTRAVENOUS

## 2018-12-09 MED ORDER — VITAMIN B-12 100 MCG PO TABS
100.0000 ug | ORAL_TABLET | Freq: Every day | ORAL | Status: DC
  Administered 2018-12-09 – 2018-12-10 (×2): 100 ug via ORAL
  Filled 2018-12-09 (×2): qty 1

## 2018-12-09 MED ORDER — MAGNESIUM SULFATE 2 GM/50ML IV SOLN
2.0000 g | Freq: Once | INTRAVENOUS | Status: AC
  Administered 2018-12-09: 2 g via INTRAVENOUS
  Filled 2018-12-09: qty 50

## 2018-12-09 MED ORDER — FERROUS SULFATE 325 (65 FE) MG PO TABS
325.0000 mg | ORAL_TABLET | Freq: Every day | ORAL | Status: DC
  Administered 2018-12-10: 325 mg via ORAL
  Filled 2018-12-09 (×2): qty 1

## 2018-12-09 MED ORDER — ATORVASTATIN CALCIUM 40 MG PO TABS
40.0000 mg | ORAL_TABLET | Freq: Every day | ORAL | Status: DC
  Administered 2018-12-09 – 2018-12-10 (×2): 40 mg via ORAL
  Filled 2018-12-09 (×2): qty 1

## 2018-12-09 MED ORDER — FERROUS SULFATE 325 (65 FE) MG PO TABS: 325.0000 mg | ORAL_TABLET | Freq: Every day | ORAL | Status: DC

## 2018-12-09 MED ORDER — SODIUM CHLORIDE 0.9% FLUSH
3.0000 mL | Freq: Once | INTRAVENOUS | Status: AC
  Administered 2018-12-09: 3 mL via INTRAVENOUS

## 2018-12-09 MED ORDER — PANTOPRAZOLE SODIUM 40 MG IV SOLR
40.0000 mg | Freq: Two times a day (BID) | INTRAVENOUS | Status: DC
  Administered 2018-12-09: 40 mg via INTRAVENOUS
  Filled 2018-12-09: qty 40

## 2018-12-09 MED ORDER — FUROSEMIDE 40 MG PO TABS
40.0000 mg | ORAL_TABLET | Freq: Every day | ORAL | Status: DC
  Administered 2018-12-09: 40 mg via ORAL
  Filled 2018-12-09: qty 1

## 2018-12-09 MED ORDER — SERTRALINE HCL 100 MG PO TABS
100.0000 mg | ORAL_TABLET | Freq: Every day | ORAL | Status: DC
  Administered 2018-12-09 – 2018-12-10 (×2): 100 mg via ORAL
  Filled 2018-12-09 (×3): qty 1

## 2018-12-09 MED ORDER — ACETAMINOPHEN 325 MG PO TABS: 650.0000 mg | ORAL_TABLET | Freq: Four times a day (QID) | ORAL | Status: DC | PRN

## 2018-12-09 MED ORDER — BUSPIRONE HCL 5 MG PO TABS: 5.0000 mg | ORAL_TABLET | Freq: Two times a day (BID) | ORAL | Status: DC | PRN

## 2018-12-09 MED ORDER — PANTOPRAZOLE SODIUM 40 MG PO TBEC
40.0000 mg | DELAYED_RELEASE_TABLET | Freq: Two times a day (BID) | ORAL | Status: DC
  Administered 2018-12-09 – 2018-12-10 (×2): 40 mg via ORAL
  Filled 2018-12-09 (×2): qty 1

## 2018-12-09 MED ORDER — SODIUM CHLORIDE 0.9 % IV SOLN
510.0000 mg | Freq: Once | INTRAVENOUS | Status: AC
  Administered 2018-12-09: 510 mg via INTRAVENOUS
  Filled 2018-12-09: qty 17

## 2018-12-09 MED ORDER — METOPROLOL TARTRATE 12.5 MG HALF TABLET
12.5000 mg | ORAL_TABLET | Freq: Two times a day (BID) | ORAL | Status: DC
  Administered 2018-12-09 – 2018-12-10 (×3): 12.5 mg via ORAL
  Filled 2018-12-09 (×3): qty 1

## 2018-12-09 MED ORDER — POTASSIUM CHLORIDE CRYS ER 20 MEQ PO TBCR
40.0000 meq | EXTENDED_RELEASE_TABLET | Freq: Once | ORAL | Status: AC
  Administered 2018-12-09: 40 meq via ORAL
  Filled 2018-12-09: qty 2

## 2018-12-09 NOTE — ED Notes (Addendum)
ED TO INPATIENT HANDOFF REPORT  ED Nurse Name and Phone #: [phone]  S Name/Age/Gender Hannah [name] 73 y.o. female Room/Bed: 036C/036C  Code Status   Code Status: Full Code  Home/SNF/Other Home Patient oriented to: self, place, time and situation Is this baseline? Yes   Triage Complete: Triage complete  Chief Complaint SOB; Anemia, D/C 2 days ago  Triage Note Pt arrives via [facility] from home with respiratory complaints. Pt woke up screaming for her daughter. Initial tachypnea and shallow, improved with breathing techniques by EMS, said she felt much better.  Pt has multiple calls to her home. Hx of dementia, anxiety, htn. cbg 183, 120/68, 90 initial then decreased to the 70's, 98-100% RA. 97.7  Pt says she was having a hard time breathing and had pain in the middle of her chest (says it lasted 2-3 minutes). Pt denies any pain at this time, says she does feel SOB now.    Allergies No Known Allergies  Level of Care/Admitting Diagnosis ED Disposition    ED Disposition Condition Comment   Admit  Hospital Area: [place] [[id number]]  Level of Care: Telemetry Cardiac [103]  Covid Evaluation: Confirmed COVID Negative  Diagnosis: Chest pain [284132]  Admitting Physician: Leeanne Rio [[id number]]  Attending Physician: Leeanne Rio [[id number]]  PT Class (Do Not Modify): Observation [104]  PT Acc Code (Do Not Modify): Observation [10022]       B Medical/Surgery History Past Medical History:  Diagnosis Date  . Anemia   . Anxiety   . Arthritis    "hands" (05/09/2015)  . GERD (gastroesophageal reflux disease)   . High cholesterol   . History of blood transfusion 1960s; 05/08/2015   "when I had my kids; low HgB"  . Hypertension   . Osteoarthritis   . Pneumonia 05/2004   Past Surgical History:  Procedure Laterality Date  . BIOPSY  10/28/2018   Procedure: BIOPSY;  Surgeon: Juanita Craver, MD;  Location: [facility] ENDOSCOPY;  Service: Endoscopy;;  . CARDIAC  CATHETERIZATION  05/2004  . COLONOSCOPY  07/2002   Archie Endo 09/02/2010  . COLONOSCOPY WITH PROPOFOL N/A 10/28/2018   Procedure: COLONOSCOPY WITH PROPOFOL;  Surgeon: Juanita Craver, MD;  Location: [facility] ENDOSCOPY;  Service: Endoscopy;  Laterality: N/A;  . ESOPHAGOGASTRODUODENOSCOPY  07/2002   w/biopsies/notes 09/02/2010  . ESOPHAGOGASTRODUODENOSCOPY (EGD) WITH PROPOFOL N/A 10/28/2018   Procedure: ESOPHAGOGASTRODUODENOSCOPY (EGD) WITH PROPOFOL;  Surgeon: Juanita Craver, MD;  Location: [facility] ENDOSCOPY;  Service: Endoscopy;  Laterality: N/A;  . HOT HEMOSTASIS N/A 10/28/2018   Procedure: HOT HEMOSTASIS (ARGON PLASMA COAGULATION/BICAP);  Surgeon: Juanita Craver, MD;  Location: [facility] ENDOSCOPY;  Service: Endoscopy;  Laterality: N/A;  . IR ANGIOGRAM SELECTIVE EACH ADDITIONAL VESSEL  10/30/2018  . IR ANGIOGRAM SELECTIVE EACH ADDITIONAL VESSEL  10/30/2018  . IR ANGIOGRAM SELECTIVE EACH ADDITIONAL VESSEL  10/30/2018  . IR ANGIOGRAM SELECTIVE EACH ADDITIONAL VESSEL  10/30/2018  . IR ANGIOGRAM VISCERAL SELECTIVE  10/30/2018  . IR EMBO ART  VEN HEMORR LYMPH EXTRAV  INC GUIDE ROADMAPPING  10/30/2018  . IR US GUIDANCE  10/30/2018  . POLYPECTOMY  10/28/2018   Procedure: POLYPECTOMY;  Surgeon: Juanita Craver, MD;  Location: [facility] ENDOSCOPY;  Service: Endoscopy;;     A IV Location/Drains/Wounds Patient Lines/Drains/Airways Status   Active Line/Drains/Airways    None          Intake/Output Last 24 hours  Intake/Output Summary (Last 24 hours) at 12/09/2018 0814 Last data filed at 12/09/2018 0422 Gross per 24 hour  Intake -  Output 0 ml  Net 0 ml    Labs/Imaging Results for orders placed or performed during the hospital encounter of 12/09/18 (from the past 48 hour(s))  Basic metabolic panel     Status: Abnormal   Collection Time: 12/09/18  2:07 AM  Result Value Ref Range   Sodium 138 135 - 145 mmol/L   Potassium 3.4 (L) 3.5 - 5.1 mmol/L   Chloride 102 98 - 111 mmol/L   CO2 24 22 - 32 mmol/L   Glucose, Bld 137 (H) 70 - 99 mg/dL    BUN 37 (H) 8 - 23 mg/dL   Creatinine, Ser 1.63 (H) 0.44 - 1.00 mg/dL   Calcium 8.1 (L) 8.9 - 10.3 mg/dL   GFR calc non Af Amer 31 (L) >60 mL/min   GFR calc Af Amer 35 (L) >60 mL/min   Anion gap 12 5 - 15    Comment: Performed at [facility], [address] [place], [place] [address]  CBC     Status: Abnormal   Collection Time: 12/09/18  2:07 AM  Result Value Ref Range   WBC 8.4 4.0 - 10.5 K/uL   RBC 2.67 (L) 3.87 - 5.11 MIL/uL   Hemoglobin 7.1 (L) 12.0 - 15.0 g/dL   HCT 24.7 (L) 36.0 - 46.0 %   MCV 92.5 80.0 - 100.0 fL   MCH 26.6 26.0 - 34.0 pg   MCHC 28.7 (L) 30.0 - 36.0 g/dL   RDW 17.0 (H) 11.5 - 15.5 %   Platelets 309 150 - 400 K/uL   nRBC 0.0 0.0 - 0.2 %    Comment: Performed at [facility], [place] [address] [place], [place] [address]  Troponin I (High Sensitivity)     Status: None   Collection Time: 12/09/18  2:07 AM  Result Value Ref Range   Troponin I (High Sensitivity) 14 <18 ng/L    Comment: (NOTE) Elevated high sensitivity troponin I (hsTnI) values and significant  changes across serial measurements may suggest ACS but many other  chronic and acute conditions are known to elevate hsTnI results.  Refer to the "Links" section for chest pain algorithms and additional  guidance. Performed at [facility], [place] [address] [place], [place] [address]   Brain natriuretic peptide     Status: Abnormal   Collection Time: 12/09/18  2:07 AM  Result Value Ref Range   B Natriuretic Peptide 260.5 (H) 0.0 - 100.0 pg/mL    Comment: Performed at [facility] [address], [place], [place] [address]  SARS Coronavirus 2 [facility] order, Performed in [facility] hospital lab) Nasopharyngeal Nasopharyngeal Swab     Status: None   Collection Time: 12/09/18  3:50 AM   Specimen: Nasopharyngeal Swab  Result Value Ref Range   SARS Coronavirus 2 NEGATIVE NEGATIVE    Comment: (NOTE) If result is NEGATIVE SARS-CoV-2 target nucleic acids are NOT DETECTED. The  SARS-CoV-2 RNA is generally detectable in upper and lower  respiratory specimens during the acute phase of infection. The lowest  concentration of SARS-CoV-2 viral copies this assay can detect is 250  copies / mL. A negative result does not preclude SARS-CoV-2 infection  and should not be used as the sole basis for treatment or other  patient management decisions.  A negative result may occur with  improper specimen collection / handling, submission of specimen other  than nasopharyngeal swab, presence of viral mutation(s) within the  areas targeted by this assay, and  inadequate number of viral copies  (<250 copies / mL). A negative result must be combined with clinical  observations, patient history, and epidemiological information. If result is POSITIVE SARS-CoV-2 target nucleic acids are DETECTED. The SARS-CoV-2 RNA is generally detectable in upper and lower  respiratory specimens dur ing the acute phase of infection.  Positive  results are indicative of active infection with SARS-CoV-2.  Clinical  correlation with patient history and other diagnostic information is  necessary to determine patient infection status.  Positive results do  not rule out bacterial infection or co-infection with other viruses. If result is PRESUMPTIVE POSTIVE SARS-CoV-2 nucleic acids MAY BE PRESENT.   A presumptive positive result was obtained on the submitted specimen  and confirmed on repeat testing.  While 2019 novel coronavirus  (SARS-CoV-2) nucleic acids may be present in the submitted sample  additional confirmatory testing may be necessary for epidemiological  and / or clinical management purposes  to differentiate between  SARS-CoV-2 and other Sarbecovirus currently known to infect humans.  If clinically indicated additional testing with an alternate test  methodology [phone]) is advised. The SARS-CoV-2 RNA is generally  detectable in upper and lower respiratory sp ecimens during the acute   phase of infection. The expected result is Negative. Fact Sheet for Patients:  StrictlyIdeas.no Fact Sheet for Healthcare Providers: BankingDealers.co.za This test is not yet approved or cleared by the [place] FDA and has been authorized for detection and/or diagnosis of SARS-CoV-2 by FDA under an Emergency Use Authorization (EUA).  This EUA will remain in effect (meaning this test can be used) for the duration of the COVID-19 declaration under Section 564(b)(1) of the Act, 21 U.S.C. section 360bbb-3(b)(1), unless the authorization is terminated or revoked sooner. Performed at [facility], [place] [address], [place], [place] [address]   Troponin I (High Sensitivity)     Status: None   Collection Time: 12/09/18  3:55 AM  Result Value Ref Range   Troponin I (High Sensitivity) 14 <18 ng/L    Comment: (NOTE) Elevated high sensitivity troponin I (hsTnI) values and significant  changes across serial measurements may suggest ACS but many other  chronic and acute conditions are known to elevate hsTnI results.  Refer to the "Links" section for chest pain algorithms and additional  guidance. Performed at [facility], [place] [address], [place], [place] [address]   Type and screen     Status: None (Preliminary result)   Collection Time: 12/09/18  4:00 AM  Result Value Ref Range   ABO/RH(D) O NEG    Antibody Screen POS    Sample Expiration 12/12/2018,2359    Antibody Identification      ANTI D Performed at [facility], [place] [address] [place], [place] [address]    Unit Number [id number]    Blood Component Type RED CELLS,LR    Unit division 00    Status of Unit ALLOCATED    Transfusion Status OK TO TRANSFUSE    Crossmatch Result COMPATIBLE    Unit Number [id number]    Blood Component Type RED CELLS,LR    Unit division 00    Status of Unit ALLOCATED    Transfusion Status OK TO TRANSFUSE    Crossmatch Result  COMPATIBLE    Dg Chest 2 View  Result Date: 12/09/2018 CLINICAL DATA:  Radiograph December 07, 2018, CT November 06, 2010 EXAM: CHEST - 2 VIEW COMPARISON:  Radiograph December 06, 2018, CT November 06, 2018 FINDINGS: Some mild residual  hazy opacity in the lung bases with septal and fissural thickening. No consolidation. No pneumothorax or effusion. The aorta is calcified. The remaining cardiomediastinal contours are unremarkable. No acute osseous or soft tissue abnormality. Vascular coil pack noted in the upper abdomen. IMPRESSION: Features suggest mild residual interstitial edema. No consolidative process Electronically Signed   By: Lovena Le M.D.   On: 12/09/2018 02:39    Pending Labs Unresulted Labs (From admission, onward)    Start     Ordered   12/10/18 6546  Basic metabolic panel  Tomorrow morning,   R     12/09/18 0707   12/10/18 0500  CBC with Differential/Platelet  Tomorrow morning,   R     12/09/18 0656   12/09/18 0807  Iron and TIBC  (Anemia Panel (PNL))  Once,   STAT     12/09/18 0806   12/09/18 0807  Reticulocytes  (Anemia Panel (PNL))  Once,   STAT     12/09/18 0806   12/09/18 0807  Vitamin B12  (Anemia Panel (PNL))  Once,   STAT     12/09/18 0806   12/09/18 0806  Ferritin  (Anemia Panel (PNL))  Once,   STAT     12/09/18 0806   12/09/18 0806  Folate  (Anemia Panel (PNL))  Once,   STAT     12/09/18 0806   12/09/18 0716  Lipid panel  Once,   STAT     12/09/18 0716   12/09/18 0707  Magnesium  Once,   STAT     12/09/18 0707   12/09/18 0707  TSH  Once,   STAT     12/09/18 0707          Vitals/Pain Today's Vitals   12/09/18 0615 12/09/18 0622 12/09/18 0732 12/09/18 0803  BP: (!) 140/50  (!) 136/57   Pulse: 95  88   Resp: 18  18   Temp:      TempSrc:      SpO2: 97%  97%   Weight:      Height:      PainSc:  0-No pain  0-No pain    Isolation Precautions No active isolations  Medications Medications  acetaminophen (TYLENOL) tablet 650 mg (has no administration in time  range)  atorvastatin (LIPITOR) tablet 40 mg (has no administration in time range)  furosemide (LASIX) tablet 40 mg (has no administration in time range)  metoprolol tartrate (LOPRESSOR) tablet 12.5 mg (has no administration in time range)  busPIRone (BUSPAR) tablet 5 mg (has no administration in time range)  sertraline (ZOLOFT) tablet 100 mg (has no administration in time range)  ferrous sulfate tablet 325 mg (has no administration in time range)  pantoprazole (PROTONIX) injection 40 mg (has no administration in time range)  sodium chloride flush (NS) 0.9 % injection 3 mL (3 mLs Intravenous Given 12/09/18 0425)    Mobility walks High fall risk   Focused Assessments Cardiac Assessment Handoff:  Cardiac Rhythm: Normal sinus rhythm, Bundle branch block No results found for: CKTOTAL, CKMB, CKMBINDEX, TROPONINI Lab Results  Component Value Date   DDIMER 3.41 (H) 11/06/2018   Does the Patient currently have chest pain? No     R Recommendations: See Admitting Provider Note  Report given to:   Additional Notes:   Patient repeats herself, has tangential speech that is easily redirected. Recent echocardiogram in July 2020 showing ejection fraction between 60 and 65%. Patient does not appear severely volume overloaded on exam but does have trace lower  extremity edema without crackles on exam. Plan to continue home lasix in order to assist with improvement in respiratory status.

## 2018-12-09 NOTE — Progress Notes (Signed)
FPTS Interim Progress Note  Hannah [name] reports that she is very hungry this morning and would just like to eat.  She denies any shortness of breath or chest pain.  Says that she is otherwise doing well.  Reports she had a bowel movement this morning and it was not dark or tarry.   O: BP 123/62 (BP Location: Right Arm)   Pulse 87   Temp 98.6 F (37 C) (Oral)   Resp (!) 22   Ht 5\' 2"  (1.575 m)   Wt 73 kg   SpO2 99%   BMI 29.44 kg/m   General: NAD resting comfortably in bed.  Reports she is hungry and wants to eat. HEENT: Atraumatic. Normocephalic. Neck: No cervical lymphadenopathy.  Cardiac: RRR, no m/r/g Respiratory: CTAB, normal work of breathing Abdomen: soft, nontender, nondistended, bowel sounds normal Skin: warm and dry, no rashes noted.  Ice edema on lower extremities Neuro: alert and oriented   A/P:  Anemia Patient'Hannah hemoglobin 8/21 is 7.1 from 7.8 on 8/18 and 7.8 on 8/14.  I spoke with a colleague of her GI doctor, Dr. Collene Mares, and he looked at her chart and asked if she would follow-up outpatient.  He said considering her most recent work-up was right around a month ago there is no need to re-scope her if she is having normal bowel movements and no signs of active bleeding. - Follow-up with GI outpatient  AKI Patient'Hannah creatinine increased to 1.63 (8/21) from 1.13 (8/18) from 1.07 (8/14).  BUN is also consistently elevated since that time.  Labs indicate his most likely prerenal rather than cardiac related.   -  Trend creatinines -500 mL bolus of normal saline 8/21 -Reassess in the p.m.  Hannah Shave, MD 12/09/2018, 11:23 AM PGY-1, [place] [facility] Service pager [phone]

## 2018-12-09 NOTE — Discharge Summary (Signed)
[facility] Discharge Summary  [name] Medical record number: [id number] Date of birth: 1945/09/09 Age: 73 y.o. Gender: female Date of Admission: 12/09/2018  Date of Discharge: 12/10/2018 Admitting Physician: Leeanne Rio, MD  Primary Care Provider: [name], MD Consultants: None  Indication for Hospitalization: AKI/anemia  Discharge Diagnoses/Problem List:  Anemia sleep apnea versus PND AKI Hypertension Hyperlipidemia Anxiety Dementia and behavioral memory concerns  Disposition: Home with daughter  Discharge Condition: Stable  Discharge Exam: General: Alert and cooperative, sitting on the side of the bed Cardio: Normal S1 and S2, no S3 or S4. Rhythm is regular. No murmurs or rubs.   Pulm: Clear to auscultation bilaterally, no crackles, wheezing, or diminished breath sounds. Normal respiratory effort Abdomen: Bowel sounds normal. Abdomen soft and non-tender.  Extremities: No peripheral edema. Warm/ well perfused.  Neuro: Cranial nerves grossly intact Psych: Patient is oriented to person place and time.  Of note patient does have difficulty with short-term memory.  I asked patient she had seen another physician this morning.  Patient reports on the first physician to see her this morning.  After talking to the nurse patient had already seen Dr. Gwendlyn Deutscher this morning but she did not remember.   Brief Hospital Course:  Was brought to the emergency room after awakening shortness of breath and complaining of chest pain.  This was her sixth visit to the emergency room in the last few months. While in the emergency room it was also found that she had a globin of 7.2.  On admission exam the family medicine team reports she was not able to recall why she was at the hospital.  Patient was pleasantly confused.  There was also concern from the daughter who takes care of the patient that her caregiver burden was too high for  the daughter to manage so she was admitted for assessment for SNF placement.  PT/OT were consulted and GI was a curbside consult to give their recommendations on the patient's low hemoglobin.   GI reports that they did not endoscopy on her approximately 1 month ago and saw no major source of bleeding.  They did cauterize some polyps but saw no need for another endoscopy.  The patient had an AKI (Cr-1.62) on admission and given her sickle exam it appeared she may be dry so she was given a 500 mL bolus of fluids and her creatinine was followed to 8/22 (Cr- 1.26).  Her baseline creatinine is around 1.  PT/OT Recs included home health PT and OT: Supervision/assistance-24 hours.  Patient was also given IV iron and started on p.o. iron.  Her magnesium was also repleted.  Patient was stable 8/22 and indicated she wanted to go home.  Daughter was called and willing to accept patient.  Issues for Follow Up:  1. Labs signifcant for iron deficiency. S/p feraheme in hospital. Continue PO iron daily  2. Started B12 in the hospital, continue at discharge  3. Discharge with home health OT, PT. Follow up with caregiver (daughter) for burnout or other needs  4. Recheck Creatinine 5. Consider making Lasix as needed given patient's dehydration on admission 6. Follow up with Dr. Collene Mares (gastroenterology) as outpatient   Significant Procedures: None  Significant Labs and Imaging:  CBC: Recent Labs  Lab 12/06/18 0301 12/09/18 0207 12/10/18 0242  WBC 7.1 8.4 6.1  NEUTROABS 4.8  --   --   HGB 7.8* 7.1* 7.2*  HCT 27.3* 24.7* 24.2*  MCV 95.1  92.5 90.0  PLT 268 309 282   CMP: Recent Labs  Lab 12/06/18 0301 12/09/18 0207 12/09/18 0716 12/10/18 0242  NA 140 138  --  139  K 4.3 3.4*  --  3.9  CL 109 102  --  103  CO2 21* 24  --  25  GLUCOSE 128* 137*  --  108*  BUN 21 37*  --  25*  CREATININE 1.13* 1.63*  --  1.26*  CALCIUM 8.7* 8.1*  --  8.6*  MG  --   --  1.5*  --   ALBUMIN 3.6  --   --  3.5    GFR: Estimated Creatinine Clearance: 36.4 mL/min (A) (by C-G formula based on SCr of 1.26 mg/dL (H)).  Liver Function Tests: Recent Labs  Lab 12/06/18 0301 12/10/18 0242  AST 17 14*  ALT 11 10  ALKPHOS 100 92  BILITOT 0.6 1.0  PROT 6.6 6.3*  ALBUMIN 3.6 3.5   Lipid Profile: Recent Labs    12/09/18 0716  CHOL 127  HDL 55  LDLCALC 63  TRIG 44  CHOLHDL 2.3    Thyroid Function Tests: Recent Labs    12/09/18 0716  TSH 4.175    Anemia Panel: Recent Labs    12/09/18 0944  VITAMINB12 121*  FOLATE 14.9  FERRITIN 10*  TIBC 440  IRON 13*  RETICCTPCT 2.8    Results/Tests Pending at Time of Discharge: None  Discharge Medications:  Allergies as of 12/10/2018   No Known Allergies     Medication List    TAKE these medications   acetaminophen 325 MG tablet Commonly known as: TYLENOL Take 650 mg by mouth every 6 (six) hours as needed for mild pain or headache.   Adult One Daily Gummies Chew Chew 1 tablet by mouth daily.   atorvastatin 40 MG tablet Commonly known as: LIPITOR Take 1 tablet (40 mg total) by mouth daily.   busPIRone 5 MG tablet Commonly known as: BUSPAR Take 5 mg by mouth 2 (two) times daily as needed (anxiety attacks).   cyanocobalamin 100 MCG tablet Take 1 tablet (100 mcg total) by mouth daily. Start taking on: December 11, 2018   ferrous sulfate 325 (65 FE) MG tablet Take 325 mg by mouth daily with breakfast.   furosemide 40 MG tablet Commonly known as: LASIX TAKE 1 TABLET BY MOUTH EVERY DAY   metoprolol tartrate 25 MG tablet Commonly known as: LOPRESSOR Take 0.5 tablets (12.5 mg total) by mouth 2 (two) times daily.   omeprazole 20 MG capsule Commonly known as: PRILOSEC TAKE 1 CAPSULE BY MOUTH DAILY AS NEEDED What changed:   how much to take  how to take this  when to take this  additional instructions   sertraline 50 MG tablet Commonly known as: ZOLOFT Take 100 mg by mouth daily.       Discharge Instructions:  Please refer to Patient Instructions section of EMR for full details.  Patient was counseled important signs and symptoms that should prompt return to medical care, changes in medications, dietary instructions, activity restrictions, and follow up appointments.   Follow-Up Appointments: Follow-up Information    Shirley, [place], DO. Go on 12/13/2018.   Specialty: Family Medicine Why: Your appointment is scheduled for 4:10pm. Please arrive 15 mintues early.  Contact information: [address] [address] [place] [facility]. Schedule an appointment as soon as possible for a visit.  Contact information: [place] [address] [phone]       Juanita Craver, MD. Schedule an appointment as soon as possible for a visit.   Specialty: Gastroenterology Contact information: [address] [place] [address] [phone]           Gifford Shave, MD 12/10/2018, 11:14 AM PGY-1, [place]

## 2018-12-09 NOTE — ED Provider Notes (Signed)
[facility] EMERGENCY DEPARTMENT Provider Note   CSN: [id number] Arrival date & time: 12/09/18  0143     History   Chief Complaint Chief Complaint  Patient presents with   Shortness of Breath   Level 5 caveat due to dementia HPI JERNIE SCHUTT is a 73 y.o. female.     The history is provided by the patient and a relative.  Shortness of Breath Severity:  Moderate Onset quality:  Gradual Timing:  Constant Progression:  Worsening Chronicity:  Recurrent Relieved by:  Nothing Worsened by:  Nothing Associated symptoms: cough   Associated symptoms: no fever   Patient with history of dementia, hypertension, CHF Presents with shortness of breath.  Patient has had recurrent shortness of breath recently has had recent evaluations for this.  Apparently EMS was called tonight due to the patient screaming that she was short of breath.  Patient has had multiple EMS calls to her home Past Medical History:  Diagnosis Date   Anemia    Anxiety    Arthritis    "hands" (05/09/2015)   GERD (gastroesophageal reflux disease)    High cholesterol    History of blood transfusion 1960s; 05/08/2015   "when I had my kids; low HgB"   Hypertension    Osteoarthritis    Pneumonia 05/2004    Patient Active Problem List   Diagnosis Date Noted   CHF (congestive heart failure) ([place]) 11/06/2018   Gastric bleed    Elevated TSH 02/03/2018   Dementia due to atherosclerosis with behavioral disturbance ([place]) 02/02/2018   Renal insufficiency 11/09/2017   Gout 09/11/2015   OBESITY 03/26/2010   Anxiety state 08/09/2008   Essential hypertension 08/09/2008   GERD 08/09/2008    Past Surgical History:  Procedure Laterality Date   BIOPSY  10/28/2018   Procedure: BIOPSY;  Surgeon: Juanita Craver, MD;  Location: [place];  Service: Endoscopy;;   CARDIAC CATHETERIZATION  05/2004   COLONOSCOPY  07/2002   Archie Endo 09/02/2010   COLONOSCOPY WITH PROPOFOL N/A 10/28/2018     Procedure: COLONOSCOPY WITH PROPOFOL;  Surgeon: Juanita Craver, MD;  Location[facility] ENDOSCOPY;  Service: Endoscopy;  Laterality: N/A;   ESOPHAGOGASTRODUODENOSCOPY  07/2002   w/biopsies/notes 09/02/2010   ESOPHAGOGASTRODUODENOSCOPY (EGD) WITH PROPOFOL N/A 10/28/2018   Procedure: ESOPHAGOGASTRODUODENOSCOPY (EGD) WITH PROPOFOL;  Surgeon: Juanita Craver, MD;  Location: [facility] ENDOSCOPY;  Service: Endoscopy;  Laterality: N/A;   HOT HEMOSTASIS N/A 10/28/2018   Procedure: HOT HEMOSTASIS (ARGON PLASMA COAGULATION/BICAP);  Surgeon: Juanita Craver, MD;  Location: [facility] ENDOSCOPY;  Service: Endoscopy;  Laterality: N/A;   IR ANGIOGRAM SELECTIVE EACH ADDITIONAL VESSEL  10/30/2018   IR ANGIOGRAM SELECTIVE EACH ADDITIONAL VESSEL  10/30/2018   IR ANGIOGRAM SELECTIVE EACH ADDITIONAL VESSEL  10/30/2018   IR ANGIOGRAM SELECTIVE EACH ADDITIONAL VESSEL  10/30/2018   IR ANGIOGRAM VISCERAL SELECTIVE  10/30/2018   IR EMBO ART  VEN HEMORR LYMPH EXTRAV  INC GUIDE ROADMAPPING  10/30/2018   IR US GUIDANCE  10/30/2018   POLYPECTOMY  10/28/2018   Procedure: POLYPECTOMY;  Surgeon: Juanita Craver, MD;  Location: [facility] ENDOSCOPY;  Service: Endoscopy;;     OB History   No obstetric history on file.      Home Medications    Prior to Admission medications   Medication Sig Start Date End Date Taking? Authorizing Provider  acetaminophen (TYLENOL) 325 MG tablet Take 650 mg by mouth every 6 (six) hours as needed for mild pain or headache.    [provider]  atorvastatin (LIPITOR) 40  MG tablet Take 1 tablet (40 mg total) by mouth daily. 05/31/18   Chambliss, Jeb Levering, MD  busPIRone (BUSPAR) 5 MG tablet Take 5 mg by mouth 2 (two) times daily as needed (anxiety attacks).  12/02/18   [name], MD  ferrous sulfate 325 (65 FE) MG tablet Take 325 mg by mouth daily with breakfast.    [provider]  furosemide (LASIX) 40 MG tablet TAKE 1 TABLET BY MOUTH EVERY DAY Patient taking differently: Take 40 mg by mouth  daily.  12/05/18   [name], MD  metoprolol tartrate (LOPRESSOR) 25 MG tablet Take 0.5 tablets (12.5 mg total) by mouth 2 (two) times daily. 11/12/18   Anderson, Chelsey L, DO  Multiple Vitamins-Minerals (ADULT ONE DAILY GUMMIES) CHEW Chew 1 tablet by mouth daily.    [provider]  omeprazole (PRILOSEC) 20 MG capsule TAKE 1 CAPSULE BY MOUTH DAILY AS NEEDED Patient taking differently: Take 20 mg by mouth at bedtime.  11/29/18   [name], MD  sertraline (ZOLOFT) 50 MG tablet Take 100 mg by mouth daily. 11/29/18   [name], MD    Family History Family History  Problem Relation Age of Onset   Heart disease Mother        died at age 18   Breast cancer Sister    Cancer Sister        Breast   Hypertension Father    Hypertension Sister     Social History Social History   Tobacco Use   Smoking status: Former Smoker    Packs/day: 0.30    Years: 8.00    Pack years: 2.40    Types: Cigarettes    Quit date: 05/30/2004    Years since quitting: 14.5   Smokeless tobacco: Never Used  Substance Use Topics   Alcohol use: No    Alcohol/week: 0.0 standard drinks   Drug use: Never     Allergies   Patient has no known allergies.   Review of Systems Review of Systems  Unable to perform ROS: Dementia  Constitutional: Negative for fever.  Respiratory: Positive for cough and shortness of breath.      Physical Exam Updated Vital Signs BP (!) 129/59    Pulse 81    Temp 98.1 F (36.7 C) (Oral)    Resp 16    Ht 1.575 m (5\' 2" )    Wt 73 kg    SpO2 95%    BMI 29.44 kg/m   Physical Exam CONSTITUTIONAL: Elderly, anxious HEAD: Normocephalic/atraumatic EYES: EOMI/PERRL ENMT: Mucous membranes moist NECK: supple no meningeal signs SPINE/BACK:entire spine nontender CV: S1/S2 noted, no murmurs/rubs/gallops noted LUNGS: Decreased breath sounds bilaterally, no acute distress ABDOMEN: soft, nontender, no rebound or guarding, bowel sounds  noted throughout abdomen GU:no cva tenderness NEURO: Pt is awake/alert, moves all extremitiesx4.  No facial droop.   EXTREMITIES: pulses normal/equal, full ROM, no significant edema noted SKIN: warm, color normal PSYCH: no abnormalities of mood noted, alert and oriented to situation   ED Treatments / Results  Labs (all labs ordered are listed, but only abnormal results are displayed) Labs Reviewed  BASIC METABOLIC PANEL - Abnormal; Notable for the following components:      Result Value   Potassium 3.4 (*)    Glucose, Bld 137 (*)    BUN 37 (*)    Creatinine, Ser 1.63 (*)    Calcium 8.1 (*)    GFR calc non Af Amer 31 (*)  GFR calc Af Amer 35 (*)    All other components within normal limits  CBC - Abnormal; Notable for the following components:   RBC 2.67 (*)    Hemoglobin 7.1 (*)    HCT 24.7 (*)    MCHC 28.7 (*)    RDW 17.0 (*)    All other components within normal limits  BRAIN NATRIURETIC PEPTIDE - Abnormal; Notable for the following components:   B Natriuretic Peptide 260.5 (*)    All other components within normal limits  SARS CORONAVIRUS 2 (HOSPITAL ORDER, [place] [facility])  TYPE AND SCREEN  TROPONIN I (HIGH SENSITIVITY)  TROPONIN I (HIGH SENSITIVITY)    EKG EKG Interpretation  Date/Time:  Friday December 09 2018 01:50:37 EDT Ventricular Rate:  79 PR Interval:  172 QRS Duration: 122 QT Interval:  442 QTC Calculation: 506 R Axis:   62 Text Interpretation:  Normal sinus rhythm Septal infarct , age undetermined Abnormal ECG Confirmed by Ripley Fraise [id number]) on 12/09/2018 3:43:44 AM   Radiology Dg Chest 2 View  Result Date: 12/09/2018 CLINICAL DATA:  Radiograph December 07, 2018, CT November 06, 2010 EXAM: CHEST - 2 VIEW COMPARISON:  Radiograph December 06, 2018, CT November 06, 2018 FINDINGS: Some mild residual hazy opacity in the lung bases with septal and fissural thickening. No consolidation. No pneumothorax or effusion. The aorta is calcified.  The remaining cardiomediastinal contours are unremarkable. No acute osseous or soft tissue abnormality. Vascular coil pack noted in the upper abdomen. IMPRESSION: Features suggest mild residual interstitial edema. No consolidative process Electronically Signed   By: Lovena Le M.D.   On: 12/09/2018 02:39    Procedures Procedures (including critical care time)  Medications Ordered in ED Medications  sodium chloride flush (NS) 0.9 % injection 3 mL (3 mLs Intravenous Given 12/09/18 0425)     Initial Impression / Assessment and Plan / ED Course  I have reviewed the triage vital signs and the nursing notes.  Pertinent labs & imaging results that were available during my care of the patient were reviewed by me and considered in my medical decision making (see chart for details).        5:13 AM Patient has had multiple visits this summer to the hospital, typically for chest pain or shortness of breath.  She has also had previous anemia from likely GI bleed.  She denies any recent bleeding symptoms, but hemoglobin is changed from prior.  I had a long conversation with the patient's daughter via phone.  Patient will wake up and scream that she is short of breath.  She does have evidence of edema on chest x-ray. Labs are pending at this time 6:31 AM Patient stable at this time.  However due to acute on chronic anemia, as well as some residual edema on x-ray, she will be admitted.  Patient and family having difficulty managing her illnesses at home.  Discussed with family medicine to be evaluated for admission.    Final Clinical Impressions(s) / ED Diagnoses   Final diagnoses:  Acute pulmonary edema ([place])  Acute anemia  AKI (acute kidney injury) [facility])    ED Discharge Orders    None       Ripley Fraise, MD 12/09/18 [phone]

## 2018-12-09 NOTE — H&P (Addendum)
[facility] Admission History and Physical Service Pager: [phone]  [name] Medical record number: [id number] Date of birth: [birth year] Age: 73 y.o. Gender: female  Primary Care Provider: [name], MD Consultants: GI Code Status: Full Code  Preferred Emergency Contact: [name]   Chief Complaint: Chest pain & shortness of breath  Assessment and Plan: Hannah [name] is a 73 y.o. female presenting with dyspnea, chest pain and anemia.  PMH is significant for Dementia, HTN, HLD, GERD, OA, Anxiety, Anemia.    Shortness of breath from ?anemia 2/2 GI bleed Patient presents with reported shortness of breath, patient was recently admitted to the hospital 10/27/2018 for GI bleed. At that time, endoscopy and colonoscopy were performed by GI. She was found to have a single 7 mm sessile polypoid lesion with bleeding was found in the duodenal bulb with fresh blood oozing from the center. A single angioectasia without bleeding was found in the duodenal bulb. Also showed showed diverticulosis in the sigmoid colon. A 10 mm polypoid lesion was found in the proximal sigmoid colon-biopsies done. Two 6-7 mm sessile polyps were found in the proximal ascending colon; polyps were removed. Biopsies were pending on these results. 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 IR was consulted for the focal ectasia of the distal trifurcation of the GDA with bleeding intraluminal polyp on endoscopy. At the time, she was thought to have had a successful mesenteric arteriogram and percutaneous coil embolization. Unfortunately patient had positive FOBT on 12th of August 2020. Hemoglobin had been relatively stable in the mid 8's and has slowly decreased to 7.1 on admission. Patient GI bleed could potentially be from any of the biopsied lesions, diverticulosis,  or from embolization site of the ectasia.  Patient denies melena or dizziness with ambulation yet patient is poor historian given dementia.  Per patient's daughter, her dementia is becoming more difficult to manage at home.  Patient was reported to have multiple episodes of waking up & screaming, complaining of shortness of breath and increased chest pressure.  On exam patient has no increased work of breathing, is clear to auscultation bilaterally without wheezing nor crackles and saturating 97% on room air and is denying current chest pain as well as shortness of breath. Patient presents this admission with mildly elevated, but stable BNP 260. Echocardiogram on 10/2018 showed EF 60-65%. CXR shows mild pulmonary congestion. EKG was NSR with no ST changes from prior EKG. Patient's daughter expressing concern for increasing care requirements and patient's advancing dementia with at least 6 ED visits in the last 6 weeks. The differential for SOB is possibly from anemia from GI bleed. From a cardiac stand point, patient appears to be stable. Patient also had hospitalization for similar incidence of chest pressure again in 07/19 and 7/23. At that time, patient seemed to have pulmonary edema than this current admission. From these two prior admissions, patient was evaluated by cardiology who recommended outpatient follow up with stress testing. Patient has not yet been to see cardiology. Patient also was evaluated for PE but had a negagtive CTA on 11/06/2018. Patient could have element of OSA. Will monitor for hypoxia while sleeping. Daughter is concerned that her mother will continue to make repeated return visits to the ED given her repeated episodes of shortness of breath. We will plan to admit the patient for evaluation for GI bleed and monitor hemoglobin. consult social work for  facility placement and recommendations of physical therapy and Occupational Therapy. Patient is COVID neg.   -Consult GI in the  AM -Continuous pulse ox -Continue Lasix 40 mg -Protonix 40 IV -AM CBC to monitor hemoglobin  -Continue ferrous sulfate -PT/OT -Plan to consult social work -consider outpatient sleep study   ACS Rule out with pulmonary edema with SOB  Patient reported to the walk-in note complaining of admits chest pressure and shortness of breath.  EKG shows normal sinus rhythm.  Patient has history of anemia, this is concerning for contributions of patient shortness of breath.  Chest x-ray concerning for pulmonary edema, patient also with concern for volume overload with increased bilateral lower extremity edema.  Troponin is in the ED at 14.  Recent echocardiogram in July 2020 showing ejection fraction between 60 and 65%. Patient does not appear severely volume overloaded on exam but does have trace lower extremity edema without crackles on exam. Will plan to continue home lasix in order to assist with improvement in respiratory status. Patient saturating 97% on RA.  Recent A1C 5.3 on 7/19.  -repeast EKG,previous with normal sinus rhythm -Trend troponins -Cardiac monitoring - lipid panel, TSH - consider cardiology consult for functional stress testing of heart  AKI Cr elevated at 1.63, previously 0.98 Will avoid starting IV fluids this patient has concern for volume overload with minimal bilateral lower extremity edema. -Continue to monitor creatinine with a.m. BMP  Hypertension 140/50 Patient on home metoprolol 25 mg twice daily -Continue metoprolol   HLD: Patient on home atorvastatin 40 mg -Atorvastatin 40 mg  Anxiety: Meds include Zoloft 50 mg daily -Continue Zoloft 50 mg -Continue buspirone 5 mg  Dementia with behavioral and memory concerns from family As noted above, patient has had multiple visits to the ED and hospital for perceived SOB that seems to resolve. Patient does not have memory of any of these episodes. Spoke with patient's daughter and caregiver, [name], by phone. She was  tearful when we spoke. [name] felt that her mother's healthcare was beyond her ability to manage, but was concerned about placing her mother in a facility due to feelings of guilt and COVID19.  -PT/OT -SW consult to help with placement.   FEN/GI: NPO Prophylaxis: SCDs  Disposition: admit to cardiac telemetry   History of Present Illness:  Hannah [name] is a 73 y.o. female presenting with dyspnea and chest pain.  Patient has history of dementia and is a poor historian.  Patient denies current shortness of breath and dark stools.  Upon chart review patient found to have bleeding sesamoid polyps.  Patient has history of anemia with current hemoglobin of 7.1 on admission.  Patient's daughter reports that patient woke in the middle of the night screaming that she felt immense pressure on her chest.  At that time patient was also endorsing shortness of breath and difficulty breathing.  Review Of Systems: Per HPI with the following additions:   Review of Systems  Respiratory: Positive for shortness of breath.   Cardiovascular: Positive for chest pain and leg swelling.    Patient Active Problem List   Diagnosis Date Noted  . CHF (congestive heart failure) ([place]) 11/06/2018  . Gastric bleed   . Elevated TSH 02/03/2018  . Dementia due to atherosclerosis with behavioral disturbance ([place]) 02/02/2018  . Renal insufficiency 11/09/2017  . Gout 09/11/2015  . OBESITY 03/26/2010  . Anxiety state 08/09/2008  . Essential hypertension 08/09/2008  . GERD 08/09/2008    Past Medical History: Past Medical History:  Diagnosis Date  . Anemia   . Anxiety   . Arthritis    "hands" (05/09/2015)  . GERD (gastroesophageal reflux disease)   . High cholesterol   . History of blood transfusion 1960s; 05/08/2015   "when I had my kids; low HgB"  . Hypertension   . Osteoarthritis   . Pneumonia 05/2004    Past Surgical History: Past Surgical History:  Procedure Laterality Date  . BIOPSY  10/28/2018    Procedure: BIOPSY;  Surgeon: Juanita Craver, MD;  Location: [facility] ENDOSCOPY;  Service: Endoscopy;;  . CARDIAC CATHETERIZATION  05/2004  . COLONOSCOPY  07/2002   Archie Endo 09/02/2010  . COLONOSCOPY WITH PROPOFOL N/A 10/28/2018   Procedure: COLONOSCOPY WITH PROPOFOL;  Surgeon: Juanita Craver, MD;  Location: [facility] ENDOSCOPY;  Service: Endoscopy;  Laterality: N/A;  . ESOPHAGOGASTRODUODENOSCOPY  07/2002   w/biopsies/notes 09/02/2010  . ESOPHAGOGASTRODUODENOSCOPY (EGD) WITH PROPOFOL N/A 10/28/2018   Procedure: ESOPHAGOGASTRODUODENOSCOPY (EGD) WITH PROPOFOL;  Surgeon: Juanita Craver, MD;  Location: [facility] ENDOSCOPY;  Service: Endoscopy;  Laterality: N/A;  . HOT HEMOSTASIS N/A 10/28/2018   Procedure: HOT HEMOSTASIS (ARGON PLASMA COAGULATION/BICAP);  Surgeon: Juanita Craver, MD;  Location: [facility] ENDOSCOPY;  Service: Endoscopy;  Laterality: N/A;  . IR ANGIOGRAM SELECTIVE EACH ADDITIONAL VESSEL  10/30/2018  . IR ANGIOGRAM SELECTIVE EACH ADDITIONAL VESSEL  10/30/2018  . IR ANGIOGRAM SELECTIVE EACH ADDITIONAL VESSEL  10/30/2018  . IR ANGIOGRAM SELECTIVE EACH ADDITIONAL VESSEL  10/30/2018  . IR ANGIOGRAM VISCERAL SELECTIVE  10/30/2018  . IR EMBO ART  VEN HEMORR LYMPH EXTRAV  INC GUIDE ROADMAPPING  10/30/2018  . IR US GUIDANCE  10/30/2018  . POLYPECTOMY  10/28/2018   Procedure: POLYPECTOMY;  Surgeon: Juanita Craver, MD;  [facility] ENDOSCOPY;  Service: Endoscopy;;    Social History: Social History   Tobacco Use  . Smoking status: Former Smoker    Packs/day: 0.30    Years: 8.00    Pack years: 2.40    Types: Cigarettes    Quit date: 05/30/2004    Years since quitting: 14.5  . Smokeless tobacco: Never Used  Substance Use Topics  . Alcohol use: No    Alcohol/week: 0.0 standard drinks  . Drug use: Never   Additional social history: Patient lives with daughter [name], secondary to dementia  Family History: Family History  Problem Relation Age of Onset  . Heart disease Mother        died at age 31  . Breast cancer Sister   .  Cancer Sister        Breast  . Hypertension Father   . Hypertension Sister     Allergies and Medications: No Known Allergies No current facility-administered medications on file prior to encounter.    Current Outpatient Medications on File Prior to Encounter  Medication Sig Dispense Refill  . acetaminophen (TYLENOL) 325 MG tablet Take 650 mg by mouth every 6 (six) hours as needed for mild pain or headache.    Marland Kitchen atorvastatin (LIPITOR) 40 MG tablet Take 1 tablet (40 mg total) by mouth daily. 90 tablet 3  . busPIRone (BUSPAR) 5 MG tablet Take 5 mg by mouth 2 (two) times daily as needed (anxiety attacks).  60 tablet 1  . ferrous sulfate 325 (65 FE) MG tablet Take 325 mg by mouth daily with breakfast.    . furosemide (LASIX) 40 MG tablet TAKE 1 TABLET BY MOUTH EVERY DAY (Patient taking differently: Take 40 mg by mouth daily. ) 30 tablet 1  . metoprolol tartrate (LOPRESSOR) 25 MG tablet  Take 0.5 tablets (12.5 mg total) by mouth 2 (two) times daily. 60 tablet 0  . Multiple Vitamins-Minerals (ADULT ONE DAILY GUMMIES) CHEW Chew 1 tablet by mouth daily.    Marland Kitchen omeprazole (PRILOSEC) 20 MG capsule TAKE 1 CAPSULE BY MOUTH DAILY AS NEEDED (Patient taking differently: Take 20 mg by mouth at bedtime. ) 180 capsule 0  . sertraline (ZOLOFT) 50 MG tablet Take 100 mg by mouth daily. 45 tablet 0    Objective: BP (!) 129/59   Pulse 81   Temp 98.1 F (36.7 C) (Oral)   Resp 16   Ht 5\' 2"  (1.575 m)   Wt 73 kg   SpO2 95%   BMI 29.44 kg/m   Exam: General: Elderly female appearing stated age lying in bed in no acute distress Eyes: Scleral icterus extraocular muscles intact Cardiovascular: Regular rate and rhythm without murmurs, gallops or friction rub Respiratory: Clear to auscultation bilaterally without crackles or wheezing, normal work of breathing Gastrointestinal: Soft, nontender MSK: Moves all extremities with normal range of motion Derm: Occasional ecchymosis, no rashes Neuro: Patient is alert,  oriented to self Psych: Patient repeats herself, has tangential speech that is easily redirected  Labs and Imaging: CBC BMET  Recent Labs  Lab 12/09/18 0207  WBC 8.4  HGB 7.1*  HCT 24.7*  PLT 309   Recent Labs  Lab 12/09/18 0207  NA 138  K 3.4*  CL 102  CO2 24  BUN 37*  CREATININE 1.63*  GLUCOSE 137*  CALCIUM 8.1*     EKG: Normal sinus rhythm  CXR: Features suggest mild residual interstitial edema. No consolidative process  Stark Klein, MD 12/09/2018, 5:46 AM PGY-1, [place] Intern pager: [phone], text pages welcome  [place]   I have seen and examined this patient.    I have discussed the findings and exam with the intern and agree with the above note, which I have edited appropriately in [place]. I helped develop the management plan that is described in the resident's note, and I agree with the content.   Marny Lowenstein, MD, MS FAMILY MEDICINE RESIDENT - PGY3 12/09/2018 8:01 AM

## 2018-12-09 NOTE — ED Notes (Signed)
ED TO INPATIENT HANDOFF REPORT  ED Nurse Name and Phone #: Otila Kluver @ [phone]  S Name/Age/Gender Hannah [name] 73 y.o. female Room/Bed: 015C/015C  Code Status   Code Status: Prior  Home/SNF/Other Home Patient oriented to: self and place Is this baseline? Yes   Triage Complete: Triage complete  Chief Complaint SOB; Anemia, D/C 2 days ago  Triage Note Pt arrives via [facility] from home with respiratory complaints. Pt woke up screaming for her daughter. Initial tachypnea and shallow, improved with breathing techniques by EMS, said she felt much better.  Pt has multiple calls to her home. Hx of dementia, anxiety, htn. cbg 183, 120/68, 90 initial then decreased to the 70's, 98-100% RA. 97.7  Pt says she was having a hard time breathing and had pain in the middle of her chest (says it lasted 2-3 minutes). Pt denies any pain at this time, says she does feel SOB now.    Allergies No Known Allergies  Level of Care/Admitting Diagnosis ED Disposition    None      B Medical/Surgery History Past Medical History:  Diagnosis Date  . Anemia   . Anxiety   . Arthritis    "hands" (05/09/2015)  . GERD (gastroesophageal reflux disease)   . High cholesterol   . History of blood transfusion 1960s; 05/08/2015   "when I had my kids; low HgB"  . Hypertension   . Osteoarthritis   . Pneumonia 05/2004   Past Surgical History:  Procedure Laterality Date  . BIOPSY  10/28/2018   Procedure: BIOPSY;  Surgeon: Juanita Craver, MD;  Location: [facility] ENDOSCOPY;  Service: Endoscopy;;  . CARDIAC CATHETERIZATION  05/2004  . COLONOSCOPY  07/2002   Archie Endo 09/02/2010  . COLONOSCOPY WITH PROPOFOL N/A 10/28/2018   Procedure: COLONOSCOPY WITH PROPOFOL;  Surgeon: Juanita Craver, MD;  Location: [facility] ENDOSCOPY;  Service: Endoscopy;  Laterality: N/A;  . ESOPHAGOGASTRODUODENOSCOPY  07/2002   w/biopsies/notes 09/02/2010  . ESOPHAGOGASTRODUODENOSCOPY (EGD) WITH PROPOFOL N/A 10/28/2018   Procedure: ESOPHAGOGASTRODUODENOSCOPY  (EGD) WITH PROPOFOL;  Surgeon: Juanita Craver, MD;  Location: [facility] ENDOSCOPY;  Service: Endoscopy;  Laterality: N/A;  . HOT HEMOSTASIS N/A 10/28/2018   Procedure: HOT HEMOSTASIS (ARGON PLASMA COAGULATION/BICAP);  Surgeon: Juanita Craver, MD;  Location: [facility] ENDOSCOPY;  Service: Endoscopy;  Laterality: N/A;  . IR ANGIOGRAM SELECTIVE EACH ADDITIONAL VESSEL  10/30/2018  . IR ANGIOGRAM SELECTIVE EACH ADDITIONAL VESSEL  10/30/2018  . IR ANGIOGRAM SELECTIVE EACH ADDITIONAL VESSEL  10/30/2018  . IR ANGIOGRAM SELECTIVE EACH ADDITIONAL VESSEL  10/30/2018  . IR ANGIOGRAM VISCERAL SELECTIVE  10/30/2018  . IR EMBO ART  VEN HEMORR LYMPH EXTRAV  INC GUIDE ROADMAPPING  10/30/2018  . IR US GUIDANCE  10/30/2018  . POLYPECTOMY  10/28/2018   Procedure: POLYPECTOMY;  Surgeon: Juanita Craver, MD;  Location: [facility] ENDOSCOPY;  Service: Endoscopy;;     A IV Location/Drains/Wounds Patient Lines/Drains/Airways Status   Active Line/Drains/Airways    None          Intake/Output Last 24 hours  Intake/Output Summary (Last 24 hours) at 12/09/2018 0424 Last data filed at 12/09/2018 0422 Gross per 24 hour  Intake -  Output 0 ml  Net 0 ml    Labs/Imaging Results for orders placed or performed during the hospital encounter of 12/09/18 (from the past 48 hour(s))  Basic metabolic panel     Status: Abnormal   Collection Time: 12/09/18  2:07 AM  Result Value Ref Range   Sodium 138 135 - 145 mmol/L   Potassium 3.4 (L)  3.5 - 5.1 mmol/L   Chloride 102 98 - 111 mmol/L   CO2 24 22 - 32 mmol/L   Glucose, Bld 137 (H) 70 - 99 mg/dL   BUN 37 (H) 8 - 23 mg/dL   Creatinine, Ser 1.63 (H) 0.44 - 1.00 mg/dL   Calcium 8.1 (L) 8.9 - 10.3 mg/dL   GFR calc non Af Amer 31 (L) >60 mL/min   GFR calc Af Amer 35 (L) >60 mL/min   Anion gap 12 5 - 15    Comment: Performed at [facility] [address] [place], [place] [address]  CBC     Status: Abnormal   Collection Time: 12/09/18  2:07 AM  Result Value Ref Range   WBC 8.4 4.0 - 10.5 K/uL    RBC 2.67 (L) 3.87 - 5.11 MIL/uL   Hemoglobin 7.1 (L) 12.0 - 15.0 g/dL   HCT 24.7 (L) 36.0 - 46.0 %   MCV 92.5 80.0 - 100.0 fL   MCH 26.6 26.0 - 34.0 pg   MCHC 28.7 (L) 30.0 - 36.0 g/dL   RDW 17.0 (H) 11.5 - 15.5 %   Platelets 309 150 - 400 K/uL   nRBC 0.0 0.0 - 0.2 %    Comment: Performed at [facility], [place] [address] [place], [place] [address]  Troponin I (High Sensitivity)     Status: None   Collection Time: 12/09/18  2:07 AM  Result Value Ref Range   Troponin I (High Sensitivity) 14 <18 ng/L    Comment: (NOTE) Elevated high sensitivity troponin I (hsTnI) values and significant  changes across serial measurements may suggest ACS but many other  chronic and acute conditions are known to elevate hsTnI results.  Refer to the "Links" section for chest pain algorithms and additional  guidance. Performed at [facility], [place] [address], [place],  [address]   Type and screen     Status: None (Preliminary result)   Collection Time: 12/09/18  4:00 AM  Result Value Ref Range   ABO/RH(D) PENDING    Antibody Screen PENDING    Sample Expiration      12/12/2018,2359 Performed at [facility], [place] [address] [place],  [address]    Dg Chest 2 View  Result Date: 12/09/2018 CLINICAL DATA:  Radiograph December 07, 2018, CT November 06, 2010 EXAM: CHEST - 2 VIEW COMPARISON:  Radiograph December 06, 2018, CT November 06, 2018 FINDINGS: Some mild residual hazy opacity in the lung bases with septal and fissural thickening. No consolidation. No pneumothorax or effusion. The aorta is calcified. The remaining cardiomediastinal contours are unremarkable. No acute osseous or soft tissue abnormality. Vascular coil pack noted in the upper abdomen. IMPRESSION: Features suggest mild residual interstitial edema. No consolidative process Electronically Signed   By: Lovena Le M.D.   On: 12/09/2018 02:39    Pending Labs Unresulted Labs (From admission, onward)    Start     Ordered    12/09/18 0350  SARS Coronavirus 2 [facility] order, Performed in [facility] hospital lab) Nasopharyngeal Nasopharyngeal Swab  (Symptomatic/High Risk of Exposure/Tier 1 Patients Labs with Precautions)  Once,   STAT    Question Answer Comment  Is this test for diagnosis or screening Diagnosis of ill patient   Symptomatic for COVID-19 as defined by CDC Yes   Date of Symptom Onset 12/09/2018   Hospitalized for COVID-19 Yes   Admitted to ICU for COVID-19 No   Previously tested for COVID-19  Yes   Resident in a congregate (group) care setting No   Employed in healthcare setting No   Pregnant No      12/09/18 0349   12/09/18 0349  Brain natriuretic peptide  Once,   STAT     12/09/18 0348          Vitals/Pain Today's Vitals   12/09/18 0155 12/09/18 0157 12/09/18 0158 12/09/18 0416  BP: (!) 122/48 (!) 122/55    Pulse: 83     Resp: 18     Temp: 98.1 F (36.7 C)     TempSrc: Oral     SpO2: 98%     Weight:    73 kg  Height:    5\' 2"  (1.575 m)  PainSc:   0-No pain     Isolation Precautions Airborne and Contact precautions  Medications Medications  sodium chloride flush (NS) 0.9 % injection 3 mL (has no administration in time range)    Mobility walks High fall risk   Focused Assessments Cardiac Assessment Handoff:  Cardiac Rhythm: Normal sinus rhythm, Bundle branch block No results found for: CKTOTAL, CKMB, CKMBINDEX, TROPONINI Lab Results  Component Value Date   DDIMER 3.41 (H) 11/06/2018   Does the Patient currently have chest pain? No  , Pulmonary Assessment Handoff:  Lung sounds: Bilateral Breath Sounds: Clear O2 Device: Room Air        R Recommendations: See Admitting Provider Note  Report given to:   Additional Notes:  Pt has hx of dementia. Pt's daughter, [name], to arrive later to be with mom. Pt's questioning is repetitive.

## 2018-12-09 NOTE — ED Notes (Signed)
Patient arrived to room very angry, stating that she is ready to leave. Pt encouraged to stay to speak to physician. Refused gown, VS and cardiac monitoring. Called and spoke with pt's daughter, [name] (whom she lives with) and advised her of the situation. Pt advised that some of her labs are abnormal. Pt states she didn't care, we haven't done anything for her, and she wants to go home. Md notified that pt wants to leave AMA.

## 2018-12-09 NOTE — Evaluation (Signed)
Occupational Therapy Evaluation Patient Details Name: Hannah [name] MRN: [id number] DOB: [birth year] Today's Date: 12/09/2018    History of Present Illness Hannah [name] is a 73 y.o. female presenting with dyspnea, chest pain and anemia.  PMH is significant for Dementia, HTN, HLD, GERD, OA, Anxiety, Anemia.   Clinical Impression   This 73 yo female admitted with above presents to acute OT at a min guard A level when up on her feet ambulating, otherwise S level. Feel she will benefit from [facility] but not for long and 24 hour S. We will continue to follow.     Follow Up Recommendations  Home health OT;Supervision/Assistance - 24 hour    Equipment Recommendations  None recommended by OT       Precautions / Restrictions Precautions Precautions: None Restrictions Weight Bearing Restrictions: No      Mobility Bed Mobility Overal bed mobility: Independent                Transfers Overall transfer level: Needs assistance Equipment used: None Transfers: Sit to/from Stand Sit to Stand: Supervision         General transfer comment: when ambulating min guard A    Balance Overall balance assessment: Needs assistance Sitting-balance support: No upper extremity supported;Feet supported Sitting balance-Leahy Scale: Good     Standing balance support: No upper extremity supported Standing balance-Leahy Scale: Fair                             ADL either performed or assessed with clinical judgement   ADL                                         General ADL Comments: At an overall min guard A level when up on her feet moving around, S for sit<>stand     Vision Patient Visual Report: No change from baseline              Pertinent Vitals/Pain Pain Assessment: No/denies pain     Hand Dominance Right   Extremity/Trunk Assessment Upper Extremity Assessment Upper Extremity Assessment: Overall WFL for tasks assessed            Communication Communication Communication: No difficulties   Cognition Arousal/Alertness: Awake/alert Behavior During Therapy: WFL for tasks assessed/performed Overall Cognitive Status: History of cognitive impairments - at baseline                                 General Comments: A&O x4              Home Living Family/patient expects to be discharged to:: Private residence Living Arrangements: Children Available Help at Discharge: Family;Available 24 hours/day Type of Home: House Home Access: Stairs to enter [employer] of Steps: 2 Entrance Stairs-Rails: Right Home Layout: One level     Bathroom Shower/Tub: Occupational psychologist: Standard     Home Equipment: Environmental consultant - 2 wheels;Cane - single point;Wheelchair - manual          Prior Functioning/Environment Level of Independence: Independent        Comments: Pt reports she does all of her ADLs, but dtr does most of IADLs        OT Problem List: Decreased strength;Impaired balance (sitting and/or standing)  OT Treatment/Interventions: Self-care/ADL training;DME and/or AE instruction;Patient/family education;Balance training    OT Goals(Current goals can be found in the care plan section) Acute Rehab OT Goals Patient Stated Goal: to go home OT Goal Formulation: With patient Time For Goal Achievement: 12/23/18 Potential to Achieve Goals: Good  OT Frequency: Min 2X/week              AM-PAC OT "6 Clicks" Daily Activity     Outcome Measure Help from another person eating meals?: None Help from another person taking care of personal grooming?: A Little Help from another person toileting, which includes using toliet, bedpan, or urinal?: A Little Help from another person bathing (including washing, rinsing, drying)?: A Little Help from another person to put on and taking off regular upper body clothing?: A Little Help from another person to put on and taking off  regular lower body clothing?: A Little 6 Click Score: 19   End of Session Equipment Utilized During Treatment: Gait belt  Activity Tolerance: Patient tolerated treatment well(reported felt a little SOB, but sats 96% on RA) Patient left: in chair  OT Visit Diagnosis: Unsteadiness on feet (R26.81);Muscle weakness (generalized) (M62.81)                Time: 1341-1401 OT Time Calculation (min): 20 min Charges:  OT General Charges $OT Visit: 1 Visit OT Evaluation $OT Eval Moderate Complexity: [place], OTR/L Acute [employer] Pager [phone] Office [phone]     Almon Register 12/09/2018, 2:42 PM

## 2018-12-09 NOTE — ED Triage Notes (Signed)
Pt arrives via [facility] from home with respiratory complaints. Pt woke up screaming for her daughter. Initial tachypnea and shallow, improved with breathing techniques by EMS, said she felt much better.  Pt has multiple calls to her home. Hx of dementia, anxiety, htn. cbg 183, 120/68, 90 initial then decreased to the 70's, 98-100% RA. 97.7

## 2018-12-09 NOTE — Evaluation (Signed)
Physical Therapy Evaluation Patient Details Name: Hannah [name] MRN: [id number] DOB: 10/27/1945 Today's Date: 12/09/2018   History of Present Illness  Pt is a 73 year old female presenting with chest pain, dyspnea, and anemia. Relevant PMH includes dementia, CHF, HTN, GERD, renal insufficiency, gout, anxiety, anemia, and arthritis.  Clinical Impression  Pt presents with the above problem and the below deficits. Performed mobility tasks at the min G to supervision levels. Hx of cognitive impairments at baseline. Although pt A & O x 4 this session, pt does demonstrate decreased short term memory and difficulty with dual tasking. Pt will benefit from HHPT and 24 hour supervision at d/c to promote functional independence and safety.    Follow Up Recommendations Home health PT;Supervision/Assistance - 24 hour    Equipment Recommendations  None recommended by PT    Recommendations for Other Services       Precautions / Restrictions Precautions Precautions: Fall Restrictions Weight Bearing Restrictions: No      Mobility  Bed Mobility Overal bed mobility: Needs Assistance Bed Mobility: Supine to Sit;Sit to Supine     Supine to sit: Supervision Sit to supine: Supervision   General bed mobility comments: Pt completes bed mobility at the supervision level  Transfers Overall transfer level: Needs assistance Equipment used: None Transfers: Sit to/from Stand Sit to Stand: Supervision         General transfer comment: Pt performs transfers at the supervision level.  Ambulation/Gait Ambulation/Gait assistance: Min guard Gait Distance (Feet): 100 Feet Assistive device: None Gait Pattern/deviations: Step-through pattern;Decreased step length - right;Decreased step length - left;Trunk flexed;Narrow base of support Gait velocity: decreased   General Gait Details: Pt performs ambulation at the min G level and utilizes a step through pattern. Pt demonstrates decreased B step  length with trunk flexed posture. Mild instability noted throughout ambulation.  Stairs            Wheelchair Mobility    Modified Rankin (Stroke Patients Only)       Balance Overall balance assessment: Needs assistance Sitting-balance support: No upper extremity supported;Feet supported Sitting balance-Leahy Scale: Good     Standing balance support: No upper extremity supported Standing balance-Leahy Scale: Fair                               Pertinent Vitals/Pain Pain Assessment: No/denies pain    Home Living Family/patient expects to be discharged to:: Private residence Living Arrangements: Children Available Help at Discharge: Family;Available 24 hours/day Type of Home: House Home Access: Ramped entrance;Stairs to enter Entrance Stairs-Rails: None Entrance Stairs-Number of Steps: 5 Home Layout: One level Home Equipment: Walker - 2 wheels;Cane - quad;Wheelchair - manual      Prior Function Level of Independence: Independent         Comments: Pt reports she ambulated I without and AD, prior to hospital admission.     Hand Dominance   Dominant Hand: Right    Extremity/Trunk Assessment   Upper Extremity Assessment Upper Extremity Assessment: Defer to OT evaluation    Lower Extremity Assessment Lower Extremity Assessment: Generalized weakness    Cervical / Trunk Assessment Cervical / Trunk Assessment: Kyphotic  Communication   Communication: No difficulties  Cognition Arousal/Alertness: Awake/alert Behavior During Therapy: WFL for tasks assessed/performed Overall Cognitive Status: History of cognitive impairments - at baseline  General Comments: A & O x 4. Notable memory deficits present throughout session, such as forgetting questions that had been asked earlier in session. Pt demonstrated difficulty with dual tasking as well.      General Comments      Exercises      Assessment/Plan    PT Assessment Patient needs continued PT services  PT Problem List Decreased strength;Decreased range of motion;Decreased balance;Decreased mobility;Decreased cognition;Decreased knowledge of use of DME;Decreased safety awareness       PT Treatment Interventions DME instruction;Gait training;Functional mobility training;Therapeutic activities;Therapeutic exercise;Balance training;Cognitive remediation;Patient/family education    PT Goals (Current goals can be found in the Care Plan section)  Acute Rehab PT Goals Patient Stated Goal: to increase LE strength PT Goal Formulation: With patient Time For Goal Achievement: 12/23/18 Potential to Achieve Goals: Good Additional Goals Additional Goal #1: Pt will score > 19 on DGI to indicate low fall risk.    Frequency Min 3X/week   Barriers to discharge        Co-evaluation               AM-PAC PT "6 Clicks" Mobility  Outcome Measure Help needed turning from your back to your side while in a flat bed without using bedrails?: None Help needed moving from lying on your back to sitting on the side of a flat bed without using bedrails?: None Help needed moving to and from a bed to a chair (including a wheelchair)?: None Help needed standing up from a chair using your arms (e.g., wheelchair or bedside chair)?: None Help needed to walk in hospital room?: A Little Help needed climbing 3-5 steps with a railing? : A Little 6 Click Score: 22    End of Session Equipment Utilized During Treatment: Gait belt Activity Tolerance: Patient tolerated treatment well Patient left: in bed;with nursing/sitter in room;with call bell/phone within reach;with bed alarm set Nurse Communication: Mobility status PT Visit Diagnosis: Unsteadiness on feet (R26.81);Other abnormalities of gait and mobility (R26.89);Muscle weakness (generalized) (M62.81)    Time: 1025-8527 PT Time Calculation (min) (ACUTE ONLY): 16 min   Charges:   PT  Evaluation $PT Eval Low Complexity: 1 Low          Christophe Louis, SPT  Hannah [name] 12/09/2018, 6:30 PM

## 2018-12-09 NOTE — ED Triage Notes (Signed)
Pt says she was having a hard time breathing and had pain in the middle of her chest (says it lasted 2-3 minutes). Pt denies any pain at this time, says she does feel SOB now.

## 2018-12-10 DIAGNOSIS — R0602 Shortness of breath: Secondary | ICD-10-CM

## 2018-12-10 DIAGNOSIS — N179 Acute kidney failure, unspecified: Secondary | ICD-10-CM

## 2018-12-10 DIAGNOSIS — R079 Chest pain, unspecified: Secondary | ICD-10-CM | POA: Diagnosis not present

## 2018-12-10 LAB — CBC
HCT: 24.2 % — ABNORMAL LOW (ref 36.0–46.0)
Hemoglobin: 7.2 g/dL — ABNORMAL LOW (ref 12.0–15.0)
MCH: 26.8 pg (ref 26.0–34.0)
MCHC: 29.8 g/dL — ABNORMAL LOW (ref 30.0–36.0)
MCV: 90 fL (ref 80.0–100.0)
Platelets: 282 10*3/uL (ref 150–400)
RBC: 2.69 MIL/uL — ABNORMAL LOW (ref 3.87–5.11)
RDW: 16.5 % — ABNORMAL HIGH (ref 11.5–15.5)
WBC: 6.1 10*3/uL (ref 4.0–10.5)
nRBC: 0 % (ref 0.0–0.2)

## 2018-12-10 LAB — COMPREHENSIVE METABOLIC PANEL
ALT: 10 U/L (ref 0–44)
AST: 14 U/L — ABNORMAL LOW (ref 15–41)
Albumin: 3.5 g/dL (ref 3.5–5.0)
Alkaline Phosphatase: 92 U/L (ref 38–126)
Anion gap: 11 (ref 5–15)
BUN: 25 mg/dL — ABNORMAL HIGH (ref 8–23)
CO2: 25 mmol/L (ref 22–32)
Calcium: 8.6 mg/dL — ABNORMAL LOW (ref 8.9–10.3)
Chloride: 103 mmol/L (ref 98–111)
Creatinine, Ser: 1.26 mg/dL — ABNORMAL HIGH (ref 0.44–1.00)
GFR calc Af Amer: 48 mL/min — ABNORMAL LOW (ref >60)
GFR calc non Af Amer: 42 mL/min — ABNORMAL LOW (ref >60)
Glucose, Bld: 108 mg/dL — ABNORMAL HIGH (ref 70–99)
Potassium: 3.9 mmol/L (ref 3.5–5.1)
Sodium: 139 mmol/L (ref 135–145)
Total Bilirubin: 1 mg/dL (ref 0.3–1.2)
Total Protein: 6.3 g/dL — ABNORMAL LOW (ref 6.5–8.1)

## 2018-12-10 MED ORDER — CYANOCOBALAMIN 100 MCG PO TABS: 100.0000 ug | ORAL_TABLET | Freq: Every day | ORAL | 0 refills | Status: DC

## 2018-12-10 NOTE — TOC Transition Note (Signed)
Transition of Care [facility]) - CM/SW Discharge Note   Patient Details  Name: Hannah [name] MRN: [id number] Date of Birth: 06-Mar-1946  [facility] CM/SW Contact:  Claudie Leach, RN Phone Number: 12/10/2018, 3:25 PM   Clinical Narrative:    Pt to return home with daughter.  D[facility] chooses [facility] health.  Referral called to [facility], but denied due to insurance.  Referral accepted by Tommi Rumps at [place].   Final next level of care: [place] Barriers to Discharge: No Barriers Identified   Patient Goals and CMS Choice Patient states their goals for this hospitalization and ongoing recovery are:: to go home CMS Medicare.gov Compare Post Acute Care list provided to:: Patient Choice offered to / list presented to : Adult Children   Discharge Plan and Services            HH Arranged: PT, OT, Nurse's Aide HH Agency: [place] Date [facility] Agency Contacted: 12/10/18 Time [place]: [phone] Representative spoke with at [place]: Tommi Rumps

## 2018-12-10 NOTE — Discharge Instructions (Signed)
Thank you for allowing [place] to participate in your care!    You were admitted for anemia and assessment by PT/OT.  You were dehydrated and had a mild decrease in your kidney function so we gave you some fluids and your kidney function improved.  I spoke with GI and they recommended follow-up outpatient but that they do not feel that you are actively bleeding.  She has also been recommended that she have an outpatient stress test.  You will need to talk to your cardiologist and schedule that.  A follow-up appointment with our clinic has been scheduled with Dr. Enid Derry on 8/25 at 4:10 PM.  If you experience worsening of your admission symptoms, develop shortness of breath, life threatening emergency, suicidal or homicidal thoughts you must seek medical attention immediately by calling 911 or calling your MD immediately  if symptoms less severe.

## 2018-12-10 NOTE — Care Management Obs Status (Signed)
[place] NOTIFICATION   Patient Details  Name: Hannah [name] MRN: [id number] Date of Birth: Dec 11, 1945   Medicare Observation Status Notification Given:  Yes    Claudie Leach, RN 12/10/2018, 12:27 PM

## 2018-12-12 ENCOUNTER — Other Ambulatory Visit: Payer: Self-pay

## 2018-12-12 NOTE — Patient Outreach (Signed)
[facility] [facility]) Care Management  12/12/2018  Hannah [name] 06-24-1945 [id number]  TELEPHONE SCREENING Referral date: 12/06/18 Referral source: utilization management  Referral reason: Frequent ED visits Insurance: [employer] health care  Telephone call to patient regarding utilization management referral. HIPAA verified with patient. Patient gave verbal authorization to speak with her daughter, Luceil Herrin regarding her medical information.  Daughter states patient lives with her and she is primary caregiver.  Daughter states [facility] health is providing physical therapy and home health aid to patient. Daughter reports patient has had several emergency room visits recently . She reports patient has had several episodes of shortness of breath due to heart failure. She reports patient was also seen in the emergency room for a GI bleed/ anemia. Daughter states patient becomes very anxious when she is short of breath. Daughter states patient is on medication for her anxiety. She states she was previously on xanax and the doctor changed it to [place]. Daughter states Buspar does not work for patient. She states she has made the doctor aware of this but he will not switch patient back to xanax.  Daughter states patient has dementia as well. She states patient refuses bathing at times. Daughter states patient has also refused assisted living.  Daughter states patient lives with her and her two children. She states patient is never left alone.  Daughter states patient was recently found to have congestive heart failure. Daughter reports she is familiar with heart failure because her father had it prior to him passing. Daughter states she makes sure patient takes her medications. She states she oversees patient weighing daily, adhering to a low salt diet, and heating a heart healthy diet.  Daughter denies patient  having a heart failure action plan in place for increase symptoms. Daughter  unfamiliar with heart failure action plan. RNCM discussed and offered [facility] care management services. Daughter agreed to ongoing follow up with RNCM discussed and offered [facility] care management services. Daughter verbally agreed to follow up with RNCM.   ASSESSMENT:  Patient and/ or caregiver will benefit from ongoing follow up with [facility]   PLAN; RNCM will follow up with patient's daughter within 1 week.  RNCM will send welcome letter and packet to patient RNCm will send involvement letter to patients primary MD.  RNCm will send Advance directive packet to patient as discussed with daughter.  RNCM will send EMMI education material for congestive heart failure.   Quinn Plowman RN,BSN,CCM [facility] Telephonic  [phone]

## 2018-12-13 ENCOUNTER — Ambulatory Visit: Payer: Medicare Other | Admitting: Family Medicine

## 2018-12-13 LAB — BPAM RBC
Blood Product Expiration Date: 202009022359
Blood Product Expiration Date: 202009022359
Unit Type and Rh: 9500
Unit Type and Rh: 9500

## 2018-12-13 LAB — TYPE AND SCREEN
ABO/RH(D): O NEG
Antibody Screen: POSITIVE
Unit division: 0
Unit division: 0

## 2018-12-16 ENCOUNTER — Other Ambulatory Visit: Payer: Self-pay | Admitting: Family Medicine

## 2018-12-18 ENCOUNTER — Emergency Department (HOSPITAL_COMMUNITY): Payer: Medicare Other

## 2018-12-18 ENCOUNTER — Emergency Department (HOSPITAL_COMMUNITY)
Admission: EM | Admit: 2018-12-18 | Discharge: 2018-12-18 | Disposition: A | Discharge status: Home / Self Care | Payer: Medicare Other | Attending: Emergency Medicine | Admitting: Emergency Medicine

## 2018-12-18 ENCOUNTER — Other Ambulatory Visit: Payer: Self-pay

## 2018-12-18 DIAGNOSIS — E1165 Type 2 diabetes mellitus with hyperglycemia: Secondary | ICD-10-CM | POA: Diagnosis not present

## 2018-12-18 DIAGNOSIS — I110 Hypertensive heart disease with heart failure: Secondary | ICD-10-CM | POA: Insufficient documentation

## 2018-12-18 DIAGNOSIS — R0602 Shortness of breath: Secondary | ICD-10-CM | POA: Diagnosis not present

## 2018-12-18 DIAGNOSIS — F0390 Unspecified dementia without behavioral disturbance: Secondary | ICD-10-CM | POA: Diagnosis not present

## 2018-12-18 DIAGNOSIS — R000 Tachycardia, unspecified: Secondary | ICD-10-CM | POA: Diagnosis not present

## 2018-12-18 DIAGNOSIS — Z79899 Other long term (current) drug therapy: Secondary | ICD-10-CM | POA: Diagnosis not present

## 2018-12-18 DIAGNOSIS — R404 Transient alteration of awareness: Secondary | ICD-10-CM | POA: Diagnosis not present

## 2018-12-18 DIAGNOSIS — I509 Heart failure, unspecified: Secondary | ICD-10-CM | POA: Insufficient documentation

## 2018-12-18 DIAGNOSIS — R0689 Other abnormalities of breathing: Secondary | ICD-10-CM | POA: Diagnosis not present

## 2018-12-18 DIAGNOSIS — Z87891 Personal history of nicotine dependence: Secondary | ICD-10-CM | POA: Diagnosis not present

## 2018-12-18 DIAGNOSIS — Z8659 Personal history of other mental and behavioral disorders: Secondary | ICD-10-CM

## 2018-12-18 LAB — CBC WITH DIFFERENTIAL/PLATELET
Abs Immature Granulocytes: 0.03 10*3/uL (ref 0.00–0.07)
Basophils Absolute: 0 10*3/uL (ref 0.0–0.1)
Basophils Relative: 0 %
Eosinophils Absolute: 0 10*3/uL (ref 0.0–0.5)
Eosinophils Relative: 0 %
HCT: 26.8 % — ABNORMAL LOW (ref 36.0–46.0)
Hemoglobin: 7.6 g/dL — ABNORMAL LOW (ref 12.0–15.0)
Immature Granulocytes: 0 %
Lymphocytes Relative: 17 %
Lymphs Abs: 1.6 10*3/uL (ref 0.7–4.0)
MCH: 28 pg (ref 26.0–34.0)
MCHC: 28.4 g/dL — ABNORMAL LOW (ref 30.0–36.0)
MCV: 98.9 fL (ref 80.0–100.0)
Monocytes Absolute: 0.4 10*3/uL (ref 0.1–1.0)
Monocytes Relative: 5 %
Neutro Abs: 7.2 10*3/uL (ref 1.7–7.7)
Neutrophils Relative %: 78 %
Platelets: 270 10*3/uL (ref 150–400)
RBC: 2.71 MIL/uL — ABNORMAL LOW (ref 3.87–5.11)
RDW: 21.4 % — ABNORMAL HIGH (ref 11.5–15.5)
WBC: 9.3 10*3/uL (ref 4.0–10.5)
nRBC: 0 % (ref 0.0–0.2)

## 2018-12-18 LAB — COMPREHENSIVE METABOLIC PANEL
ALT: 13 U/L (ref 0–44)
AST: 21 U/L (ref 15–41)
Albumin: 3.3 g/dL — ABNORMAL LOW (ref 3.5–5.0)
Alkaline Phosphatase: 105 U/L (ref 38–126)
Anion gap: 17 — ABNORMAL HIGH (ref 5–15)
BUN: 25 mg/dL — ABNORMAL HIGH (ref 8–23)
CO2: 19 mmol/L — ABNORMAL LOW (ref 22–32)
Calcium: 8.6 mg/dL — ABNORMAL LOW (ref 8.9–10.3)
Chloride: 103 mmol/L (ref 98–111)
Creatinine, Ser: 1.19 mg/dL — ABNORMAL HIGH (ref 0.44–1.00)
GFR calc Af Amer: 52 mL/min — ABNORMAL LOW (ref >60)
GFR calc non Af Amer: 45 mL/min — ABNORMAL LOW (ref >60)
Glucose, Bld: 223 mg/dL — ABNORMAL HIGH (ref 70–99)
Potassium: 3.8 mmol/L (ref 3.5–5.1)
Sodium: 139 mmol/L (ref 135–145)
Total Bilirubin: 0.8 mg/dL (ref 0.3–1.2)
Total Protein: 6.1 g/dL — ABNORMAL LOW (ref 6.5–8.1)

## 2018-12-18 LAB — BRAIN NATRIURETIC PEPTIDE: B Natriuretic Peptide: 287.1 pg/mL — ABNORMAL HIGH (ref 0.0–100.0)

## 2018-12-18 MED ORDER — LORAZEPAM 2 MG/ML IJ SOLN
0.5000 mg | Freq: Once | INTRAMUSCULAR | Status: AC
  Administered 2018-12-18: 0.5 mg via INTRAVENOUS
  Filled 2018-12-18: qty 1

## 2018-12-18 NOTE — Discharge Instructions (Addendum)
You can be discharged home tonight and should follow up with your doctor this week for recheck of shortness of breath.   Return to the emergency department with any new or worsening symptoms.

## 2018-12-18 NOTE — ED Provider Notes (Signed)
[place] EMERGENCY DEPARTMENT Provider Note   CSN: [id number] Arrival date & time: 12/18/18  0209     History   Chief Complaint Chief Complaint  Patient presents with  . Shortness of Breath    HPI Hannah [name] is a 73 y.o. female.     Patient with history of CHF, HTN, HLD, anxiety presents via EMS from home called for significant SOB. The patient denies pain, fever or cough. Per daughter, [name], who is caregiver, the patient woke from sleep complaining of SoB as she has done multiple times. The daughter reports she had a normal day without incident, but significant SOB on waking tonight, similar to multiple previous episodes. She denies pain, vomiting.   The history is provided by a relative and the patient.  Shortness of Breath Associated symptoms: no fever     Past Medical History:  Diagnosis Date  . Anemia   . Anxiety   . Arthritis    "hands" (05/09/2015)  . GERD (gastroesophageal reflux disease)   . High cholesterol   . History of blood transfusion 1960s; 05/08/2015   "when I had my kids; low HgB"  . Hypertension   . Osteoarthritis   . Pneumonia 05/2004    Patient Active Problem List   Diagnosis Date Noted  . Chest pain 12/09/2018  . AKI (acute kidney injury) ([place])   . CHF (congestive heart failure) ([place]) 11/06/2018  . Acute pulmonary edema (HCC)   . Gastric bleed   . Acute anemia 10/27/2018  . Elevated TSH 02/03/2018  . Dementia due to atherosclerosis with behavioral disturbance ([place]) 02/02/2018  . Renal insufficiency 11/09/2017  . Gout 09/11/2015  . SOB (shortness of breath) 05/08/2015  . OBESITY 03/26/2010  . Anxiety state 08/09/2008  . Essential hypertension 08/09/2008  . GERD 08/09/2008    Past Surgical History:  Procedure Laterality Date  . BIOPSY  10/28/2018   Procedure: BIOPSY;  Surgeon: Juanita Craver, MD;  Location: [facility] ENDOSCOPY;  Service: Endoscopy;;  . CARDIAC CATHETERIZATION  05/2004  . COLONOSCOPY  07/2002   Archie Endo 09/02/2010  . COLONOSCOPY WITH PROPOFOL N/A 10/28/2018   Procedure: COLONOSCOPY WITH PROPOFOL;  Surgeon: Juanita Craver, MD;  Location: [facility] ENDOSCOPY;  Service: Endoscopy;  Laterality: N/A;  . ESOPHAGOGASTRODUODENOSCOPY  07/2002   w/biopsies/notes 09/02/2010  . ESOPHAGOGASTRODUODENOSCOPY (EGD) WITH PROPOFOL N/A 10/28/2018   Procedure: ESOPHAGOGASTRODUODENOSCOPY (EGD) WITH PROPOFOL;  Surgeon: Juanita Craver, MD;  Location: [facility] ENDOSCOPY;  Service: Endoscopy;  Laterality: N/A;  . HOT HEMOSTASIS N/A 10/28/2018   Procedure: HOT HEMOSTASIS (ARGON PLASMA COAGULATION/BICAP);  Surgeon: Juanita Craver, MD;  Location: [facility] ENDOSCOPY;  Service: Endoscopy;  Laterality: N/A;  . IR ANGIOGRAM SELECTIVE EACH ADDITIONAL VESSEL  10/30/2018  . IR ANGIOGRAM SELECTIVE EACH ADDITIONAL VESSEL  10/30/2018  . IR ANGIOGRAM SELECTIVE EACH ADDITIONAL VESSEL  10/30/2018  . IR ANGIOGRAM SELECTIVE EACH ADDITIONAL VESSEL  10/30/2018  . IR ANGIOGRAM VISCERAL SELECTIVE  10/30/2018  . IR EMBO ART  VEN HEMORR LYMPH EXTRAV  INC GUIDE ROADMAPPING  10/30/2018  . IR US GUIDANCE  10/30/2018  . POLYPECTOMY  10/28/2018   Procedure: POLYPECTOMY;  Surgeon: Juanita Craver, MD;  Location: [facility] ENDOSCOPY;  Service: Endoscopy;;     OB History   No obstetric history on file.      Home Medications    Prior to Admission medications   Medication Sig Start Date End Date Taking? Authorizing Provider  acetaminophen (TYLENOL) 325 MG tablet Take 650 mg by mouth every 6 (six) hours as needed  for mild pain or headache.    [provider]  atorvastatin (LIPITOR) 40 MG tablet Take 1 tablet (40 mg total) by mouth daily. 05/31/18   Chambliss, Jeb Levering, MD  busPIRone (BUSPAR) 5 MG tablet Take 5 mg by mouth 2 (two) times daily as needed (anxiety attacks).  12/02/18   [name], MD  ferrous sulfate 325 (65 FE) MG tablet Take 325 mg by mouth daily with breakfast.    [provider]  furosemide (LASIX) 40 MG tablet TAKE 1 TABLET BY MOUTH  EVERY DAY 12/16/18   Martyn Malay, MD  metoprolol tartrate (LOPRESSOR) 25 MG tablet Take 0.5 tablets (12.5 mg total) by mouth 2 (two) times daily. 11/12/18   Anderson, Chelsey L, DO  Multiple Vitamins-Minerals (ADULT ONE DAILY GUMMIES) CHEW Chew 1 tablet by mouth daily.    [provider]  omeprazole (PRILOSEC) 20 MG capsule TAKE 1 CAPSULE BY MOUTH DAILY AS NEEDED Patient taking differently: Take 20 mg by mouth at bedtime.  11/29/18   [name], MD  sertraline (ZOLOFT) 50 MG tablet Take 100 mg by mouth daily. 11/29/18   [name], MD  vitamin B-12 100 MCG tablet Take 1 tablet (100 mcg total) by mouth daily. 12/11/18 01/10/19  Gifford Shave, MD    Family History Family History  Problem Relation Age of Onset  . Heart disease Mother        died at age 5  . Breast cancer Sister   . Cancer Sister        Breast  . Hypertension Father   . Hypertension Sister     Social History Social History   Tobacco Use  . Smoking status: Former Smoker    Packs/day: 0.30    Years: 8.00    Pack years: 2.40    Types: Cigarettes    Quit date: 05/30/2004    Years since quitting: 14.5  . Smokeless tobacco: Never Used  Substance Use Topics  . Alcohol use: No    Alcohol/week: 0.0 standard drinks  . Drug use: Never     Allergies   Patient has no known allergies.   Review of Systems Review of Systems  Constitutional: Negative for chills and fever.  HENT: Negative.   Respiratory: Positive for shortness of breath.   Cardiovascular: Negative.   Gastrointestinal: Negative.   Musculoskeletal: Negative.   Skin: Negative.   Neurological: Negative.      Physical Exam Updated Vital Signs BP 131/62   Pulse (!) 101   Temp 98.1 F (36.7 C) (Oral)   Resp 20   SpO2 92%   Physical Exam Vitals signs and nursing note reviewed.  Constitutional:      Appearance: She is well-developed.  HENT:     Head: Normocephalic.  Neck:     Musculoskeletal: Normal range of  motion and neck supple.  Cardiovascular:     Rate and Rhythm: Regular rhythm. Tachycardia present.  Pulmonary:     Effort: Pulmonary effort is normal. Tachypnea present.     Breath sounds: Normal breath sounds. No wheezing, rhonchi or rales.     Comments: Hyperventilating, better with distraction. Abdominal:     General: Bowel sounds are normal.     Palpations: Abdomen is soft.     Tenderness: There is no abdominal tenderness. There is no guarding or rebound.  Musculoskeletal: Normal range of motion.     Right lower leg: No edema.     Left lower leg: No edema.  Skin:  General: Skin is warm and dry.  Neurological:     Mental Status: She is alert and oriented to person, place, and time.      ED Treatments / Results  Labs (all labs ordered are listed, but only abnormal results are displayed) Labs Reviewed  CBC WITH DIFFERENTIAL/PLATELET - Abnormal; Notable for the following components:      Result Value   RBC 2.71 (*)    Hemoglobin 7.6 (*)    HCT 26.8 (*)    MCHC 28.4 (*)    RDW 21.4 (*)    All other components within normal limits  COMPREHENSIVE METABOLIC PANEL - Abnormal; Notable for the following components:   CO2 19 (*)    Glucose, Bld 223 (*)    BUN 25 (*)    Creatinine, Ser 1.19 (*)    Calcium 8.6 (*)    Total Protein 6.1 (*)    Albumin 3.3 (*)    GFR calc non Af Amer 45 (*)    GFR calc Af Amer 52 (*)    Anion gap 17 (*)    All other components within normal limits  BRAIN NATRIURETIC PEPTIDE - Abnormal; Notable for the following components:   B Natriuretic Peptide 287.1 (*)    All other components within normal limits   Results for orders placed or performed during the hospital encounter of 12/18/18  CBC with Differential  Result Value Ref Range   WBC 9.3 4.0 - 10.5 K/uL   RBC 2.71 (L) 3.87 - 5.11 MIL/uL   Hemoglobin 7.6 (L) 12.0 - 15.0 g/dL   HCT 26.8 (L) 36.0 - 46.0 %   MCV 98.9 80.0 - 100.0 fL   MCH 28.0 26.0 - 34.0 pg   MCHC 28.4 (L) 30.0 - 36.0  g/dL   RDW 21.4 (H) 11.5 - 15.5 %   Platelets 270 150 - 400 K/uL   nRBC 0.0 0.0 - 0.2 %   Neutrophils Relative % 78 %   Neutro Abs 7.2 1.7 - 7.7 K/uL   Lymphocytes Relative 17 %   Lymphs Abs 1.6 0.7 - 4.0 K/uL   Monocytes Relative 5 %   Monocytes Absolute 0.4 0.1 - 1.0 K/uL   Eosinophils Relative 0 %   Eosinophils Absolute 0.0 0.0 - 0.5 K/uL   Basophils Relative 0 %   Basophils Absolute 0.0 0.0 - 0.1 K/uL   Immature Granulocytes 0 %   Abs Immature Granulocytes 0.03 0.00 - 0.07 K/uL   Polychromasia PRESENT   Comprehensive metabolic panel  Result Value Ref Range   Sodium 139 135 - 145 mmol/L   Potassium 3.8 3.5 - 5.1 mmol/L   Chloride 103 98 - 111 mmol/L   CO2 19 (L) 22 - 32 mmol/L   Glucose, Bld 223 (H) 70 - 99 mg/dL   BUN 25 (H) 8 - 23 mg/dL   Creatinine, Ser 1.19 (H) 0.44 - 1.00 mg/dL   Calcium 8.6 (L) 8.9 - 10.3 mg/dL   Total Protein 6.1 (L) 6.5 - 8.1 g/dL   Albumin 3.3 (L) 3.5 - 5.0 g/dL   AST 21 15 - 41 U/L   ALT 13 0 - 44 U/L   Alkaline Phosphatase 105 38 - 126 U/L   Total Bilirubin 0.8 0.3 - 1.2 mg/dL   GFR calc non Af Amer 45 (L) >60 mL/min   GFR calc Af Amer 52 (L) >60 mL/min   Anion gap 17 (H) 5 - 15  Brain natriuretic peptide  Result Value Ref Range  B Natriuretic Peptide 287.1 (H) 0.0 - 100.0 pg/mL     EKG None  Radiology Dg Chest Portable 1 View  Result Date: 12/18/2018 CLINICAL DATA:  Shortness of breath EXAM: PORTABLE CHEST 1 VIEW COMPARISON:  12/09/2018, 10/27/2018 FINDINGS: Diffusely increased interstitial opacity, suspect for acute interstitial edema or inflammation on underlying chronic disease. Increased vascular congestion. Probable trace right pleural effusion. Aortic atherosclerosis. No pneumothorax. IMPRESSION: 1. Increased vascular congestion with diffuse increased interstitial opacity suspect for acute edema superimposed on underlying chronic interstitial change. Probable trace right effusion Electronically Signed   By: Donavan Foil M.D.    On: 12/18/2018 03:33    Procedures Procedures (including critical care time)  Medications Ordered in ED Medications  LORazepam (ATIVAN) injection 0.5 mg (0.5 mg Intravenous Given 12/18/18 0235)     Initial Impression / Assessment and Plan / ED Course  I have reviewed the triage vital signs and the nursing notes.  Pertinent labs & imaging results that were available during my care of the patient were reviewed by me and considered in my medical decision making (see chart for details).        Patient to ED with SOB, hyperventilating on arrival, hypertensive, tachycardic.   Chart reviewed. Recent admission for similar symptoms, concern for CHF. Her hemoglobin tonight is 7.6, improved over recent, baseline per caregiver daughter, [name]. CXR not remarkably worse from recent.   The patient is given 0.5 mg Ativan IV and is much more comfortable. Hyperventilating is resolved.   She is evaluated by Dr. Betsey Holiday and is felt appropriate for discharge home. The patient prefers discharge. Discussed her condition with [name] who is comfortable with taking her home.   Final Clinical Impressions(s) / ED Diagnoses   Final diagnoses:  SOB (shortness of breath)  History of anxiety    ED Discharge Orders    None       Charlann Lange, PA-C 12/18/18 [id number]    Merryl Hacker, MD 12/19/18 [place] [place]

## 2018-12-20 ENCOUNTER — Other Ambulatory Visit: Payer: Self-pay

## 2018-12-20 ENCOUNTER — Ambulatory Visit: Payer: Self-pay

## 2018-12-20 NOTE — Patient Outreach (Signed)
[facility]) Care Management  12/20/2018  Hannah [name] 1945-11-27 [id number]   Medication Adherence call to Hannah [name] HIPPA Compliant Voice message left with a call back number. Hannah [name] is showing past due on Lisinopril 2.5 mg under [place].   Why Management Direct Dial [phone]  Fax [phone] [email or web address]

## 2018-12-21 ENCOUNTER — Other Ambulatory Visit: Payer: Self-pay | Admitting: Family Medicine

## 2018-12-21 ENCOUNTER — Other Ambulatory Visit: Payer: Self-pay | Admitting: Student in an Organized Health Care Education/Training Program

## 2018-12-23 ENCOUNTER — Encounter: Payer: Self-pay | Admitting: Family Medicine

## 2018-12-23 ENCOUNTER — Other Ambulatory Visit: Payer: Self-pay

## 2018-12-23 ENCOUNTER — Other Ambulatory Visit: Payer: Self-pay | Admitting: Family Medicine

## 2018-12-23 MED ORDER — TRAZODONE HCL 50 MG PO TABS: 25.0000 mg | ORAL_TABLET | Freq: Every evening | ORAL | 3 refills | Status: DC | PRN

## 2018-12-23 MED ORDER — SERTRALINE HCL 100 MG PO TABS: 100.0000 mg | ORAL_TABLET | Freq: Every day | ORAL | 1 refills | Status: DC

## 2018-12-23 NOTE — Progress Notes (Signed)
Subjective  Hannah [name] is a 73 y.o. female is presenting with the following   Social History   Social History Narrative   Emergency Contact: [name] [phone]   End of Life Plan: gave pt ad pamplet   Any pets: 1 Dog    Diet: pt has a varied diet, low consumption of carbs and fatty foods    Exercise: pt does not have a regular exercise routine    Seatbelts: pt wears seat regularly in car   Hobbies: spending time with grandchildren      *Updated 09/26/2018*   Patients daughter [name] lives with her and assists her in driving to doctors apts and errands. Patient does have a renewed drivers licenses, but feels more comfortable riding with her daughter. Patient enjoys going out to eat and spending time with her grandchildren who live in [place]. Patient stated they come during the summer to see her. Patient is excited her son, Vicente Males, is about to finish school to become a Theme park manager.    The patient has great family support system.      Chief Complaint noted Review of Symptoms - see HPI PMH - Smoking status noted.    Objective Vital Signs reviewed There were no vitals taken for this visit.  Assessments/Plans  See after visit summary for details of patient instuctions  No problem-specific Assessment & Plan notes found for this encounter.

## 2018-12-23 NOTE — Patient Outreach (Signed)
[facility]) Care Management  12/23/2018  Hannah [name] [birth year] [id number]  TELEPHONE SCREENING Referral date:12/06/18 Referral source:utilization management Referral reason:Frequent ED visits Insurance:United health care  Outreach attempt #1  Telephone call to patient's daughter, Hannah [name]. Unable to reach. HIPAA compliant voice message left with call back phone number.   PLAN: RNCM will attempt 2nd telephone outreach to patient within 4 business days.   Quinn Plowman RN,BSN,CCM [facility] Telephonic  [phone]

## 2018-12-24 ENCOUNTER — Other Ambulatory Visit: Payer: Self-pay | Admitting: Family Medicine

## 2018-12-28 ENCOUNTER — Other Ambulatory Visit: Payer: Self-pay

## 2018-12-28 ENCOUNTER — Encounter (HOSPITAL_COMMUNITY): Payer: Self-pay | Admitting: Emergency Medicine

## 2018-12-28 ENCOUNTER — Inpatient Hospital Stay (HOSPITAL_COMMUNITY)
Admission: EM | Admit: 2018-12-28 | Discharge: 2019-01-01 | DRG: 286 | Disposition: A | Discharge status: Home Health | Payer: Medicare Other | Attending: Family Medicine | Admitting: Family Medicine

## 2018-12-28 ENCOUNTER — Emergency Department (HOSPITAL_COMMUNITY): Payer: Medicare Other

## 2018-12-28 DIAGNOSIS — M1990 Unspecified osteoarthritis, unspecified site: Secondary | ICD-10-CM | POA: Diagnosis not present

## 2018-12-28 DIAGNOSIS — N183 Chronic kidney disease, stage 3 (moderate): Secondary | ICD-10-CM | POA: Diagnosis not present

## 2018-12-28 DIAGNOSIS — I130 Hypertensive heart and chronic kidney disease with heart failure and stage 1 through stage 4 chronic kidney disease, or unspecified chronic kidney disease: Secondary | ICD-10-CM | POA: Diagnosis not present

## 2018-12-28 DIAGNOSIS — E874 Mixed disorder of acid-base balance: Secondary | ICD-10-CM | POA: Diagnosis not present

## 2018-12-28 DIAGNOSIS — M109 Gout, unspecified: Secondary | ICD-10-CM | POA: Diagnosis present

## 2018-12-28 DIAGNOSIS — N289 Disorder of kidney and ureter, unspecified: Secondary | ICD-10-CM

## 2018-12-28 DIAGNOSIS — J45909 Unspecified asthma, uncomplicated: Secondary | ICD-10-CM | POA: Diagnosis present

## 2018-12-28 DIAGNOSIS — D539 Nutritional anemia, unspecified: Secondary | ICD-10-CM

## 2018-12-28 DIAGNOSIS — K219 Gastro-esophageal reflux disease without esophagitis: Secondary | ICD-10-CM | POA: Diagnosis not present

## 2018-12-28 DIAGNOSIS — R7989 Other specified abnormal findings of blood chemistry: Secondary | ICD-10-CM

## 2018-12-28 DIAGNOSIS — Z6829 Body mass index (BMI) 29.0-29.9, adult: Secondary | ICD-10-CM

## 2018-12-28 DIAGNOSIS — D72829 Elevated white blood cell count, unspecified: Secondary | ICD-10-CM | POA: Diagnosis not present

## 2018-12-28 DIAGNOSIS — I080 Rheumatic disorders of both mitral and aortic valves: Secondary | ICD-10-CM | POA: Diagnosis present

## 2018-12-28 DIAGNOSIS — I491 Atrial premature depolarization: Secondary | ICD-10-CM | POA: Diagnosis not present

## 2018-12-28 DIAGNOSIS — Z20828 Contact with and (suspected) exposure to other viral communicable diseases: Secondary | ICD-10-CM | POA: Diagnosis present

## 2018-12-28 DIAGNOSIS — E669 Obesity, unspecified: Secondary | ICD-10-CM | POA: Diagnosis present

## 2018-12-28 DIAGNOSIS — Z87891 Personal history of nicotine dependence: Secondary | ICD-10-CM

## 2018-12-28 DIAGNOSIS — F411 Generalized anxiety disorder: Secondary | ICD-10-CM | POA: Diagnosis present

## 2018-12-28 DIAGNOSIS — Z79899 Other long term (current) drug therapy: Secondary | ICD-10-CM | POA: Diagnosis not present

## 2018-12-28 DIAGNOSIS — D519 Vitamin B12 deficiency anemia, unspecified: Secondary | ICD-10-CM | POA: Diagnosis present

## 2018-12-28 DIAGNOSIS — R0602 Shortness of breath: Secondary | ICD-10-CM | POA: Diagnosis not present

## 2018-12-28 DIAGNOSIS — N179 Acute kidney failure, unspecified: Secondary | ICD-10-CM | POA: Diagnosis present

## 2018-12-28 DIAGNOSIS — D649 Anemia, unspecified: Secondary | ICD-10-CM | POA: Diagnosis not present

## 2018-12-28 DIAGNOSIS — E7800 Pure hypercholesterolemia, unspecified: Secondary | ICD-10-CM | POA: Diagnosis present

## 2018-12-28 DIAGNOSIS — E785 Hyperlipidemia, unspecified: Secondary | ICD-10-CM | POA: Diagnosis not present

## 2018-12-28 DIAGNOSIS — I447 Left bundle-branch block, unspecified: Secondary | ICD-10-CM | POA: Diagnosis not present

## 2018-12-28 DIAGNOSIS — T445X5A Adverse effect of predominantly beta-adrenoreceptor agonists, initial encounter: Secondary | ICD-10-CM | POA: Diagnosis not present

## 2018-12-28 DIAGNOSIS — I340 Nonrheumatic mitral (valve) insufficiency: Secondary | ICD-10-CM | POA: Diagnosis not present

## 2018-12-28 DIAGNOSIS — Z862 Personal history of diseases of the blood and blood-forming organs and certain disorders involving the immune mechanism: Secondary | ICD-10-CM | POA: Diagnosis present

## 2018-12-28 DIAGNOSIS — I2729 Other secondary pulmonary hypertension: Secondary | ICD-10-CM | POA: Diagnosis present

## 2018-12-28 DIAGNOSIS — J9602 Acute respiratory failure with hypercapnia: Secondary | ICD-10-CM | POA: Diagnosis not present

## 2018-12-28 DIAGNOSIS — F410 Panic disorder [episodic paroxysmal anxiety] without agoraphobia: Secondary | ICD-10-CM | POA: Diagnosis present

## 2018-12-28 DIAGNOSIS — Y92239 Unspecified place in hospital as the place of occurrence of the external cause: Secondary | ICD-10-CM | POA: Diagnosis present

## 2018-12-28 DIAGNOSIS — R000 Tachycardia, unspecified: Secondary | ICD-10-CM | POA: Diagnosis not present

## 2018-12-28 DIAGNOSIS — F05 Delirium due to known physiological condition: Secondary | ICD-10-CM | POA: Diagnosis present

## 2018-12-28 DIAGNOSIS — J810 Acute pulmonary edema: Secondary | ICD-10-CM | POA: Diagnosis not present

## 2018-12-28 DIAGNOSIS — R0689 Other abnormalities of breathing: Secondary | ICD-10-CM | POA: Diagnosis not present

## 2018-12-28 DIAGNOSIS — I5031 Acute diastolic (congestive) heart failure: Secondary | ICD-10-CM | POA: Diagnosis not present

## 2018-12-28 DIAGNOSIS — D509 Iron deficiency anemia, unspecified: Secondary | ICD-10-CM | POA: Diagnosis not present

## 2018-12-28 DIAGNOSIS — R778 Other specified abnormalities of plasma proteins: Secondary | ICD-10-CM

## 2018-12-28 DIAGNOSIS — I110 Hypertensive heart disease with heart failure: Secondary | ICD-10-CM | POA: Diagnosis not present

## 2018-12-28 DIAGNOSIS — Z803 Family history of malignant neoplasm of breast: Secondary | ICD-10-CM

## 2018-12-28 DIAGNOSIS — Z8249 Family history of ischemic heart disease and other diseases of the circulatory system: Secondary | ICD-10-CM

## 2018-12-28 DIAGNOSIS — F0280 Dementia in other diseases classified elsewhere without behavioral disturbance: Secondary | ICD-10-CM | POA: Diagnosis present

## 2018-12-28 DIAGNOSIS — E876 Hypokalemia: Secondary | ICD-10-CM | POA: Diagnosis present

## 2018-12-28 DIAGNOSIS — I5043 Acute on chronic combined systolic (congestive) and diastolic (congestive) heart failure: Secondary | ICD-10-CM | POA: Diagnosis not present

## 2018-12-28 DIAGNOSIS — R410 Disorientation, unspecified: Secondary | ICD-10-CM | POA: Diagnosis not present

## 2018-12-28 DIAGNOSIS — E538 Deficiency of other specified B group vitamins: Secondary | ICD-10-CM | POA: Diagnosis not present

## 2018-12-28 DIAGNOSIS — I5033 Acute on chronic diastolic (congestive) heart failure: Secondary | ICD-10-CM | POA: Diagnosis not present

## 2018-12-28 DIAGNOSIS — I5042 Chronic combined systolic (congestive) and diastolic (congestive) heart failure: Secondary | ICD-10-CM

## 2018-12-28 HISTORY — DX: Deficiency of other specified B group vitamins: E53.8

## 2018-12-28 LAB — URINALYSIS, ROUTINE W REFLEX MICROSCOPIC
Bilirubin Urine: NEGATIVE
Glucose, UA: NEGATIVE mg/dL
Hgb urine dipstick: NEGATIVE
Ketones, ur: NEGATIVE mg/dL
Nitrite: NEGATIVE
Protein, ur: NEGATIVE mg/dL
Specific Gravity, Urine: 1.006 (ref 1.005–1.030)
pH: 5 (ref 5.0–8.0)

## 2018-12-28 LAB — TROPONIN I (HIGH SENSITIVITY)
Troponin I (High Sensitivity): 25 ng/L — ABNORMAL HIGH (ref <18)
Troponin I (High Sensitivity): 48 ng/L — ABNORMAL HIGH (ref <18)
Troponin I (High Sensitivity): 30 ng/L — ABNORMAL HIGH (ref <18)

## 2018-12-28 LAB — CBC WITH DIFFERENTIAL/PLATELET
Abs Immature Granulocytes: 0.2 10*3/uL — ABNORMAL HIGH (ref 0.00–0.07)
Basophils Absolute: 0.2 10*3/uL — ABNORMAL HIGH (ref 0.0–0.1)
Basophils Relative: 1 %
Eosinophils Absolute: 0.3 10*3/uL (ref 0.0–0.5)
Eosinophils Relative: 2 %
HCT: 30.5 % — ABNORMAL LOW (ref 36.0–46.0)
Hemoglobin: 8.3 g/dL — ABNORMAL LOW (ref 12.0–15.0)
Lymphocytes Relative: 27 %
Lymphs Abs: 4.4 10*3/uL — ABNORMAL HIGH (ref 0.7–4.0)
MCH: 27.8 pg (ref 26.0–34.0)
MCHC: 27.2 g/dL — ABNORMAL LOW (ref 30.0–36.0)
MCV: 102 fL — ABNORMAL HIGH (ref 80.0–100.0)
Metamyelocytes Relative: 1 %
Monocytes Absolute: 0.3 10*3/uL (ref 0.1–1.0)
Monocytes Relative: 2 %
Neutro Abs: 10.9 10*3/uL — ABNORMAL HIGH (ref 1.7–7.7)
Neutrophils Relative %: 67 %
Platelets: 387 10*3/uL (ref 150–400)
RBC: 2.99 MIL/uL — ABNORMAL LOW (ref 3.87–5.11)
RDW: 19.1 % — ABNORMAL HIGH (ref 11.5–15.5)
WBC: 16.3 10*3/uL — ABNORMAL HIGH (ref 4.0–10.5)
nRBC: 0.1 % (ref 0.0–0.2)
nRBC: 1 /100 WBC — ABNORMAL HIGH

## 2018-12-28 LAB — POCT I-STAT 7, (LYTES, BLD GAS, ICA,H+H)
Acid-base deficit: 3 mmol/L — ABNORMAL HIGH (ref 0.0–2.0)
Bicarbonate: 23.5 mmol/L (ref 20.0–28.0)
Calcium, Ion: 1.19 mmol/L (ref 1.15–1.40)
HCT: 26 % — ABNORMAL LOW (ref 36.0–46.0)
Hemoglobin: 8.8 g/dL — ABNORMAL LOW (ref 12.0–15.0)
O2 Saturation: 92 %
Patient temperature: 96.8
Potassium: 3.4 mmol/L — ABNORMAL LOW (ref 3.5–5.1)
Sodium: 139 mmol/L (ref 135–145)
TCO2: 25 mmol/L (ref 22–32)
pCO2 arterial: 47.7 mmHg (ref 32.0–48.0)
pH, Arterial: 7.295 — ABNORMAL LOW (ref 7.350–7.450)
pO2, Arterial: 68 mmHg — ABNORMAL LOW (ref 83.0–108.0)

## 2018-12-28 LAB — FOLATE: Folate: 13.4 ng/mL (ref >5.9)

## 2018-12-28 LAB — BASIC METABOLIC PANEL
Anion gap: 10 (ref 5–15)
BUN: 26 mg/dL — ABNORMAL HIGH (ref 8–23)
CO2: 22 mmol/L (ref 22–32)
Calcium: 8.4 mg/dL — ABNORMAL LOW (ref 8.9–10.3)
Chloride: 103 mmol/L (ref 98–111)
Creatinine, Ser: 1.69 mg/dL — ABNORMAL HIGH (ref 0.44–1.00)
GFR calc Af Amer: 34 mL/min — ABNORMAL LOW (ref >60)
GFR calc non Af Amer: 29 mL/min — ABNORMAL LOW (ref >60)
Glucose, Bld: 306 mg/dL — ABNORMAL HIGH (ref 70–99)
Potassium: 3.9 mmol/L (ref 3.5–5.1)
Sodium: 135 mmol/L (ref 135–145)

## 2018-12-28 LAB — BRAIN NATRIURETIC PEPTIDE: B Natriuretic Peptide: 713.9 pg/mL — ABNORMAL HIGH (ref 0.0–100.0)

## 2018-12-28 LAB — IRON AND TIBC
Iron: 21 ug/dL — ABNORMAL LOW (ref 28–170)
Saturation Ratios: 5 % — ABNORMAL LOW (ref 10.4–31.8)
TIBC: 393 ug/dL (ref 250–450)
UIBC: 372 ug/dL

## 2018-12-28 LAB — MAGNESIUM: Magnesium: 1.7 mg/dL (ref 1.7–2.4)

## 2018-12-28 LAB — FERRITIN: Ferritin: 55 ng/mL (ref 11–307)

## 2018-12-28 LAB — RETICULOCYTES
Immature Retic Fract: 30.4 % — ABNORMAL HIGH (ref 2.3–15.9)
RBC.: 2.6 MIL/uL — ABNORMAL LOW (ref 3.87–5.11)
Retic Count, Absolute: 103.7 10*3/uL (ref 19.0–186.0)
Retic Ct Pct: 4 % — ABNORMAL HIGH (ref 0.4–3.1)

## 2018-12-28 LAB — SARS CORONAVIRUS 2 BY RT PCR (HOSPITAL ORDER, PERFORMED IN ~~LOC~~ HOSPITAL LAB): SARS Coronavirus 2: NEGATIVE

## 2018-12-28 LAB — VITAMIN B12: Vitamin B-12: 173 pg/mL — ABNORMAL LOW (ref 180–914)

## 2018-12-28 MED ORDER — ASPIRIN 81 MG PO CHEW
81.0000 mg | CHEWABLE_TABLET | ORAL | Status: AC
  Administered 2018-12-29: 81 mg via ORAL
  Filled 2018-12-28: qty 1

## 2018-12-28 MED ORDER — SODIUM CHLORIDE 0.9% FLUSH: 3.0000 mL | INTRAVENOUS | Status: DC | PRN

## 2018-12-28 MED ORDER — FUROSEMIDE 10 MG/ML IJ SOLN
40.0000 mg | Freq: Two times a day (BID) | INTRAMUSCULAR | Status: DC
  Administered 2018-12-28 – 2018-12-29 (×2): 40 mg via INTRAVENOUS
  Filled 2018-12-28 (×3): qty 4

## 2018-12-28 MED ORDER — MAGNESIUM SULFATE 2 GM/50ML IV SOLN
2.0000 g | Freq: Once | INTRAVENOUS | Status: AC
  Administered 2018-12-28: 2 g via INTRAVENOUS
  Filled 2018-12-28: qty 50

## 2018-12-28 MED ORDER — SODIUM CHLORIDE 0.9 % IV SOLN: INTRAVENOUS | Status: DC

## 2018-12-28 MED ORDER — ACETAMINOPHEN 650 MG RE SUPP: 650.0000 mg | Freq: Four times a day (QID) | RECTAL | Status: DC | PRN

## 2018-12-28 MED ORDER — NYSTATIN 100000 UNIT/GM EX CREA
TOPICAL_CREAM | Freq: Two times a day (BID) | CUTANEOUS | Status: DC
  Administered 2018-12-28 – 2019-01-01 (×5): via TOPICAL
  Filled 2018-12-28 (×2): qty 15

## 2018-12-28 MED ORDER — ENOXAPARIN SODIUM 40 MG/0.4ML ~~LOC~~ SOLN: 40.0000 mg | SUBCUTANEOUS | Status: DC

## 2018-12-28 MED ORDER — BUSPIRONE HCL 5 MG PO TABS
5.0000 mg | ORAL_TABLET | Freq: Two times a day (BID) | ORAL | Status: DC | PRN
  Administered 2018-12-28 – 2019-01-01 (×5): 5 mg via ORAL
  Filled 2018-12-28 (×5): qty 1

## 2018-12-28 MED ORDER — VITAMIN B-12 1000 MCG PO TABS
1000.0000 ug | ORAL_TABLET | Freq: Every day | ORAL | Status: DC
  Administered 2018-12-28 – 2019-01-01 (×5): 1000 ug via ORAL
  Filled 2018-12-28 (×5): qty 1

## 2018-12-28 MED ORDER — TRAZODONE HCL 50 MG PO TABS
25.0000 mg | ORAL_TABLET | Freq: Every evening | ORAL | Status: DC | PRN
  Administered 2018-12-28 – 2018-12-31 (×3): 25 mg via ORAL
  Filled 2018-12-28 (×4): qty 1

## 2018-12-28 MED ORDER — ACETAMINOPHEN 325 MG PO TABS: 650.0000 mg | ORAL_TABLET | Freq: Four times a day (QID) | ORAL | Status: DC | PRN

## 2018-12-28 MED ORDER — SODIUM CHLORIDE 0.9 % IV SOLN: 250.0000 mL | INTRAVENOUS | Status: DC | PRN

## 2018-12-28 MED ORDER — PANTOPRAZOLE SODIUM 40 MG PO TBEC
40.0000 mg | DELAYED_RELEASE_TABLET | Freq: Every day | ORAL | Status: DC
  Administered 2018-12-28 – 2019-01-01 (×5): 40 mg via ORAL
  Filled 2018-12-28 (×5): qty 1

## 2018-12-28 MED ORDER — VITAMIN B-12 100 MCG PO TABS
100.0000 ug | ORAL_TABLET | Freq: Every day | ORAL | Status: DC
  Filled 2018-12-28: qty 1

## 2018-12-28 MED ORDER — ASPIRIN EC 81 MG PO TBEC
81.0000 mg | DELAYED_RELEASE_TABLET | Freq: Every day | ORAL | Status: DC
  Administered 2018-12-28: 81 mg via ORAL
  Filled 2018-12-28: qty 1

## 2018-12-28 MED ORDER — LORAZEPAM 2 MG/ML IJ SOLN
0.5000 mg | Freq: Once | INTRAMUSCULAR | Status: AC
  Administered 2018-12-28: 0.5 mg via INTRAVENOUS

## 2018-12-28 MED ORDER — NITROGLYCERIN 2 % TD OINT
1.0000 [in_us] | TOPICAL_OINTMENT | Freq: Once | TRANSDERMAL | Status: AC
  Administered 2018-12-28: 1 [in_us] via TOPICAL
  Filled 2018-12-28: qty 1

## 2018-12-28 MED ORDER — LORAZEPAM 2 MG/ML IJ SOLN
INTRAMUSCULAR | Status: AC
  Filled 2018-12-28: qty 1

## 2018-12-28 MED ORDER — SERTRALINE HCL 100 MG PO TABS: 100.0000 mg | ORAL_TABLET | Freq: Every day | ORAL | Status: DC

## 2018-12-28 MED ORDER — METOPROLOL TARTRATE 12.5 MG HALF TABLET
12.5000 mg | ORAL_TABLET | Freq: Two times a day (BID) | ORAL | Status: DC
  Administered 2018-12-28 – 2019-01-01 (×9): 12.5 mg via ORAL
  Filled 2018-12-28 (×9): qty 1

## 2018-12-28 MED ORDER — POLYETHYLENE GLYCOL 3350 17 G PO PACK: 17.0000 g | PACK | Freq: Every day | ORAL | Status: DC | PRN

## 2018-12-28 MED ORDER — POTASSIUM CHLORIDE CRYS ER 20 MEQ PO TBCR
40.0000 meq | EXTENDED_RELEASE_TABLET | Freq: Once | ORAL | Status: AC
  Administered 2018-12-28: 40 meq via ORAL
  Filled 2018-12-28: qty 2

## 2018-12-28 MED ORDER — FUROSEMIDE 10 MG/ML IJ SOLN
40.0000 mg | Freq: Once | INTRAMUSCULAR | Status: AC
  Administered 2018-12-28: 40 mg via INTRAVENOUS
  Filled 2018-12-28: qty 4

## 2018-12-28 MED ORDER — ATORVASTATIN CALCIUM 40 MG PO TABS
40.0000 mg | ORAL_TABLET | Freq: Every day | ORAL | Status: DC
  Administered 2018-12-28 – 2019-01-01 (×5): 40 mg via ORAL
  Filled 2018-12-28 (×5): qty 1

## 2018-12-28 MED ORDER — FERROUS SULFATE 325 (65 FE) MG PO TABS
325.0000 mg | ORAL_TABLET | Freq: Every day | ORAL | Status: DC
  Administered 2018-12-29 – 2019-01-01 (×4): 325 mg via ORAL
  Filled 2018-12-28 (×4): qty 1

## 2018-12-28 NOTE — ED Notes (Signed)
Admitting MD notified on pt.'s elevated Troponin result.

## 2018-12-28 NOTE — Telephone Encounter (Signed)
Called and spoke to patient's daughter.  Patient is currently admitted to hospital and daughter has picked up correct RX from pharmacy.   Hannah [name], [place]

## 2018-12-28 NOTE — Progress Notes (Signed)
PCP Visit  Eating dinner in good spirits no evident dyspnea.  Knows me but no memory of event and short term does not remember details of what at cath is.    Discussed status and recommendations from cardiology with her daughter [name].  Appreciate care from [facility] team and Dr Johnsie Cancel.   Hopefully cath can reveal cause and perhaps allow better control of these acute episodes   Azzie Roup MD

## 2018-12-28 NOTE — ED Notes (Signed)
EDP notified on patient's vital sings.

## 2018-12-28 NOTE — Progress Notes (Signed)
Inpatient Diabetes Program Recommendations  AACE/ADA: New Consensus Statement on Inpatient Glycemic Control (2015)  Target Ranges:  Prepandial:   less than 140 mg/dL      Peak postprandial:   less than 180 mg/dL (1-2 hours)      Critically ill patients:  140 - 180 mg/dL   Lab Results  Component Value Date   GLUCAP 189 (H) 11/08/2018   HGBA1C 5.3 11/06/2018    Review of Glycemic Control Results for Hannah [name], Hannah [name] (MRN [id number]) as of 12/28/2018 16:05  Ref. Range 12/28/2018 02:20  Glucose Latest Ref Range: 70 - 99 mg/dL 306 (H)   Diabetes history: no history noted Outpatient Diabetes medications: none Current orders for Inpatient glycemic control: none  Inpatient Diabetes Program Recommendations:     Glucose serum was 306 mg/dL, consider adding Novolog 0-9 units TID & HS.  MD paged and discussed recommendations.   Thanks, Bronson Curb, MSN, RNC-OB Diabetes Coordinator [phone] (8a-5p)

## 2018-12-28 NOTE — ED Provider Notes (Signed)
[facility] EMERGENCY DEPARTMENT Provider Note   CSN: [id number] Arrival date & time: 12/28/18  0208    History   Chief Complaint Chief Complaint  Patient presents with  . Respiratory Distress    HPI Hannah [name] is a 73 y.o. female.   The history is provided by the patient and the EMS personnel. The history is limited by the condition of the patient (Respiratory distress).  She has history of hypertension, hyperlipidemia, heart failure, GERD, dementia, renal insufficiency and is brought in by ambulance because of respiratory distress.  She apparently was fine through the day, and woke up with severe respiratory distress.  She has done this in the past.  EMS relates that she was unable to tolerate any kind of treatment, including nebulizer treatments and was not able to tolerate CPAP.  Patient denies chest pain, heaviness, tightness, pressure.  She has not had fever or chills.  There is no cough.  Past Medical History:  Diagnosis Date  . Anemia   . Anxiety   . Arthritis    "hands" (05/09/2015)  . CHF (congestive heart failure) ([place])   . GERD (gastroesophageal reflux disease)   . High cholesterol   . History of blood transfusion 1960s; 05/08/2015   "when I had my kids; low HgB"  . Hypertension   . Osteoarthritis   . Pneumonia 05/2004    Patient Active Problem List   Diagnosis Date Noted  . Chest pain 12/09/2018  . AKI (acute kidney injury) ([place])   . CHF (congestive heart failure) ([place]) 11/06/2018  . Acute pulmonary edema ([facility])   . Gastric bleed   . Acute anemia 10/27/2018  . Elevated TSH 02/03/2018  . Dementia due to atherosclerosis with behavioral disturbance ([place]) 02/02/2018  . Renal insufficiency 11/09/2017  . Gout 09/11/2015  . SOB (shortness of breath) 05/08/2015  . OBESITY 03/26/2010  . Anxiety state 08/09/2008  . Essential hypertension 08/09/2008  . GERD 08/09/2008    Past Surgical History:  Procedure Laterality Date  . BIOPSY   10/28/2018   Procedure: BIOPSY;  Surgeon: Juanita Craver, MD;  Location: [facility] ENDOSCOPY;  Service: Endoscopy;;  . CARDIAC CATHETERIZATION  05/2004  . COLONOSCOPY  07/2002   Archie Endo 09/02/2010  . COLONOSCOPY WITH PROPOFOL N/A 10/28/2018   Procedure: COLONOSCOPY WITH PROPOFOL;  Surgeon: Juanita Craver, MD;  Location: [facility] ENDOSCOPY;  Service: Endoscopy;  Laterality: N/A;  . ESOPHAGOGASTRODUODENOSCOPY  07/2002   w/biopsies/notes 09/02/2010  . ESOPHAGOGASTRODUODENOSCOPY (EGD) WITH PROPOFOL N/A 10/28/2018   Procedure: ESOPHAGOGASTRODUODENOSCOPY (EGD) WITH PROPOFOL;  Surgeon: Juanita Craver, MD;  Location: [facility] ENDOSCOPY;  Service: Endoscopy;  Laterality: N/A;  . HOT HEMOSTASIS N/A 10/28/2018   Procedure: HOT HEMOSTASIS (ARGON PLASMA COAGULATION/BICAP);  Surgeon: Juanita Craver, MD;  Location: [facility] ENDOSCOPY;  Service: Endoscopy;  Laterality: N/A;  . IR ANGIOGRAM SELECTIVE EACH ADDITIONAL VESSEL  10/30/2018  . IR ANGIOGRAM SELECTIVE EACH ADDITIONAL VESSEL  10/30/2018  . IR ANGIOGRAM SELECTIVE EACH ADDITIONAL VESSEL  10/30/2018  . IR ANGIOGRAM SELECTIVE EACH ADDITIONAL VESSEL  10/30/2018  . IR ANGIOGRAM VISCERAL SELECTIVE  10/30/2018  . IR EMBO ART  VEN HEMORR LYMPH EXTRAV  INC GUIDE ROADMAPPING  10/30/2018  . IR US GUIDANCE  10/30/2018  . POLYPECTOMY  10/28/2018   Procedure: POLYPECTOMY;  Surgeon: Juanita Craver, MD;  Location: [facility] ENDOSCOPY;  Service: Endoscopy;;     OB History   No obstetric history on file.      Home Medications    Prior to Admission medications  Medication Sig Start Date End Date Taking? Authorizing Provider  acetaminophen (TYLENOL) 325 MG tablet Take 650 mg by mouth every 6 (six) hours as needed for mild pain or headache.    [provider]  atorvastatin (LIPITOR) 40 MG tablet Take 1 tablet (40 mg total) by mouth daily. 05/31/18   Chambliss, Jeb Levering, MD  busPIRone (BUSPAR) 5 MG tablet Take 5 mg by mouth 2 (two) times daily as needed (anxiety attacks).  12/02/18   [name], MD   ferrous sulfate 325 (65 FE) MG tablet Take 325 mg by mouth daily with breakfast.    [provider]  furosemide (LASIX) 40 MG tablet TAKE 1 TABLET BY MOUTH EVERY DAY 12/16/18   Martyn Malay, MD  metoprolol tartrate (LOPRESSOR) 25 MG tablet TAKE 0.5 TABLETS (12.5 MG TOTAL) BY MOUTH 2 (TWO) TIMES DAILY. 12/22/18   [name], MD  Multiple Vitamins-Minerals (ADULT ONE DAILY GUMMIES) CHEW Chew 1 tablet by mouth daily.    [provider]  omeprazole (PRILOSEC) 20 MG capsule TAKE 1 CAPSULE BY MOUTH DAILY AS NEEDED Patient taking differently: Take 20 mg by mouth at bedtime.  11/29/18   [name], MD  sertraline (ZOLOFT) 100 MG tablet Take 1 tablet (100 mg total) by mouth daily. 12/23/18   [name], MD  traZODone (DESYREL) 50 MG tablet Take 0.5-1 tablets (25-50 mg total) by mouth at bedtime as needed for sleep. 12/23/18   [name], MD  vitamin B-12 100 MCG tablet Take 1 tablet (100 mcg total) by mouth daily. 12/11/18 01/10/19  Gifford Shave, MD    Family History Family History  Problem Relation Age of Onset  . Heart disease Mother        died at age 73  . Breast cancer Sister   . Cancer Sister        Breast  . Hypertension Father   . Hypertension Sister     Social History Social History   Tobacco Use  . Smoking status: Former Smoker    Packs/day: 0.30    Years: 8.00    Pack years: 2.40    Types: Cigarettes    Quit date: 05/30/2004    Years since quitting: 14.5  . Smokeless tobacco: Never Used  Substance Use Topics  . Alcohol use: No    Alcohol/week: 0.0 standard drinks  . Drug use: Never     Allergies   Patient has no known allergies.   Review of Systems Review of Systems  Unable to perform ROS: Severe respiratory distress     Physical Exam Updated Vital Signs BP (!) 148/91 (BP Location: Left Arm)   Pulse (!) 137   Temp (!) 96.8 F (36 C) (Temporal)   Resp (!) 26   SpO2 100%   Physical Exam Vitals  signs and nursing note reviewed.    Extremely anxious 73 year old female, hyperventilating, but in no acute distress. Vital signs are significant for rapid heart rate and rapid respiratory rate and elevated blood pressure. Oxygen saturation is 100%, which is normal. Head is normocephalic and atraumatic. PERRLA, EOMI. Oropharynx is clear. Neck is nontender and supple without adenopathy or JVD. Back is nontender and there is no CVA tenderness. Lungs have a few bibasilar rales and a prolonged exhalation phase but no overt wheezes or rhonchi. Chest is nontender. Heart is tachycardic without murmur. Abdomen is soft, flat, nontender without masses or hepatosplenomegaly and peristalsis is normoactive. Extremities have trace edema, full range  of motion is present. Skin is warm and dry without rash. Neurologic: Mental status is normal, cranial nerves are intact, there are no motor or sensory deficits.  ED Treatments / Results  Labs (all labs ordered are listed, but only abnormal results are displayed) Labs Reviewed  BASIC METABOLIC PANEL - Abnormal; Notable for the following components:      Result Value   Glucose, Bld 306 (*)    BUN 26 (*)    Creatinine, Ser 1.69 (*)    Calcium 8.4 (*)    GFR calc non Af Amer 29 (*)    GFR calc Af Amer 34 (*)    All other components within normal limits  CBC WITH DIFFERENTIAL/PLATELET - Abnormal; Notable for the following components:   WBC 16.3 (*)    RBC 2.99 (*)    Hemoglobin 8.3 (*)    HCT 30.5 (*)    MCV 102.0 (*)    MCHC 27.2 (*)    RDW 19.1 (*)    Neutro Abs 10.9 (*)    Lymphs Abs 4.4 (*)    Basophils Absolute 0.2 (*)    nRBC 1 (*)    Abs Immature Granulocytes 0.20 (*)    All other components within normal limits  BRAIN NATRIURETIC PEPTIDE - Abnormal; Notable for the following components:   B Natriuretic Peptide 713.9 (*)    All other components within normal limits  POCT I-STAT 7, (LYTES, BLD GAS, ICA,H+H) - Abnormal; Notable for the  following components:   pH, Arterial 7.295 (*)    pO2, Arterial 68.0 (*)    Acid-base deficit 3.0 (*)    Potassium 3.4 (*)    HCT 26.0 (*)    Hemoglobin 8.8 (*)    All other components within normal limits  TROPONIN I (HIGH SENSITIVITY) - Abnormal; Notable for the following components:   Troponin I (High Sensitivity) 25 (*)    All other components within normal limits  SARS CORONAVIRUS 2 (HOSPITAL ORDER, [facility])  I-STAT ARTERIAL BLOOD GAS, ED  TROPONIN I (HIGH SENSITIVITY)    EKG EKG Interpretation  Date/Time:  Wednesday December 28 2018 02:12:31 EDT Ventricular Rate:  136 PR Interval:    QRS Duration: 115 QT Interval:  377 QTC Calculation: 568 R Axis:   89 Text Interpretation:  Sinus tachycardia Consider right atrial enlargement Nonspecific intraventricular conduction delay Anterior infarct, old When compared with ECG of 12/09/2018, HEART RATE has increased Confirmed by Delora Fuel ([id number]) on 12/28/2018 2:21:46 AM   Radiology Dg Chest Port 1 View  Result Date: 12/28/2018 CLINICAL DATA:  Shortness of breath EXAM: PORTABLE CHEST 1 VIEW COMPARISON:  12/18/2018 FINDINGS: Cardiomegaly with vascular congestion. Interstitial prominence within the lungs is similar to prior study and likely reflects interstitial edema. No visible significant effusions. No acute bony abnormality. IMPRESSION: Cardiomegaly with vascular congestion and stable interstitial prominence within the mid and lower lungs concerning for interstitial edema. Electronically Signed   By: Rolm Baptise M.D.   On: 12/28/2018 02:35    Procedures Procedures  CRITICAL CARE Performed by: Delora Fuel Total critical care time: 45 minutes Critical care time was exclusive of separately billable procedures and treating other patients. Critical care was necessary to treat or prevent imminent or life-threatening deterioration. Critical care was time spent personally by me on the following activities:  development of treatment plan with patient and/or surrogate as well as nursing, discussions with consultants, evaluation of patient's response to treatment, examination of patient, obtaining history from  patient or surrogate, ordering and performing treatments and interventions, ordering and review of laboratory studies, ordering and review of radiographic studies, pulse oximetry and re-evaluation of patient's condition.  Medications Ordered in ED Medications  LORazepam (ATIVAN) injection 0.5 mg (0.5 mg Intravenous Given 12/28/18 0231)  nitroGLYCERIN (NITROGLYN) 2 % ointment 1 inch (1 inch Topical Given 12/28/18 0305)  furosemide (LASIX) injection 40 mg (40 mg Intravenous Given 12/28/18 0306)     Initial Impression / Assessment and Plan / ED Course  I have reviewed the triage vital signs and the nursing notes.  Pertinent labs & imaging results that were available during my care of the patient were reviewed by me and considered in my medical decision making (see chart for details).  Acute dyspnea with dyspnea and not correlating with oxygen saturation.  Old records are reviewed, and she has presented with similar complaints secondary to anxiety.  ECG shows sinus tachycardia.  Will check chest x-ray and screening labs.  She is given a small dose of lorazepam.  Chest x-ray appears to show some worsening heart failure.  ABG does show mild acidosis which is probably respiratory.  She is given a dose of furosemide and topical nitroglycerin.  Following above-noted treatment, she does seem to be improving.  She is able to speak in complete sentences and is not as anxious.  Remainder of labs show stable renal insufficiency and stable anemia.  BNP is moderately elevated.  Initial troponin is in the indeterminate range and will be repeated.  Case is discussed with Dr. Enid Derry of family practice service agrees to admit the patient.  Final Clinical Impressions(s) / ED Diagnoses   Final diagnoses:  Acute on  chronic diastolic heart failure [facility])  Renal insufficiency  Macrocytic anemia    ED Discharge Orders    None       Delora Fuel, MD 22/44/97 [phone]

## 2018-12-28 NOTE — ED Notes (Signed)
Lunch Tray Ordered @ 1031-per RN called by Levada Dy

## 2018-12-28 NOTE — Progress Notes (Signed)
Patient unable to given consent for procedure due to dementia.  This RN called patient daughter to get consent.  Consent was also verified by Lyn, RN with patient's daughter Malayah Demuro.  Consent placed in chart.

## 2018-12-28 NOTE — ED Notes (Signed)
Tele ordered bfast 

## 2018-12-28 NOTE — Consult Note (Addendum)
Cardiology Consultation:   Patient ID: Hannah [name] MRN: [id number]; DOB: 1945-06-01  Admit date: 12/28/2018 Date of Consult: 12/28/2018  Primary Care Provider: [name], MD Primary Cardiologist: Jenkins Rouge, MD  Primary Electrophysiologist:  None    Patient Profile:   Hannah [name] is a 73 y.o. female with a hx of chronic dCHF, mitral regurgitation,  HTN, HLD, h/o GIB requiring transfusion, chronic iron deficiency anemia, asthma, GERD, anxiety and dementia, who is being seen today for the evaluation of a/c dCHF, at the request of Dr. McDiarmid, Internal Medicine.    History of Present Illness:   Hannah [name] has chronic diastolic HF and has had several admissions and ED visits over the last several months for a/c diastolic HF. In July, she was also treated for acute anemia/ GIB and underwent mesenteric artery coil embolization procedure. She dose have underlying dementia, which may be affecting med compliance.  Home diuretic regimen = lasix 40 mg daily. Also on  blocker therapy w/ low dose metoprolol 12.5 mg BID. Echo 10/2018 showed normal LVEF, 60-65%, no increased LV wall thickness, mild-moderate MR and normal RV. No h/o coronary disease.  She presents to the ED today w/ CC of SOB.  She was feeling completely normal yesterday and in the early morning hours while sleeping in bed developed acute dyspnea and had to sit up on the side of the bed to better breathe.  No associated chest pain and she reports full medication compliance.  She was brought in by EMS for respiratory distress. She was hyperventilating. RR was 26. Tachycardic at 137. SpO2 was 100% on RA. Dyspnea did not corelate to O2 sats. EDP gave dose of lorazepam for anxiety. BNP was elevated at 713 (much higher than previous vales, baseline BNP ~200s). CXR showed cardiomegaly with vascular congestion c/w pulmonary edema. Hs troponin mildly elevated at 48>>25, likely demand ischemia from a/c CHF. Labile BP in the ED,  systolic's ranging from 34-742, diastolic's 59-56L. Labs show leukocytosis. WBC ct 16. Aefbrile. COVID negative. Hgb 8.8 c/w baseline. BMP c/w AKI. SCr up from 1.1 previous to 1.69. Pt started on IV Lasix and being admitted by [facility] Medicine. Cardiology consulted to assist.   She feels markedly better currently.  Sitting up on the side of the bed eating lunch.  No resting dyspnea.   Heart Pathway Score:     Past Medical History:  Diagnosis Date   Anemia    Anxiety    Arthritis    "hands" (05/09/2015)   CHF (congestive heart failure) (HCC)    GERD (gastroesophageal reflux disease)    High cholesterol    History of blood transfusion 1960s; 05/08/2015   "when I had my kids; low HgB"   Hypertension    Osteoarthritis    Pneumonia 05/2004   Vitamin B12 deficiency 12/28/2018    Past Surgical History:  Procedure Laterality Date   BIOPSY  10/28/2018   Procedure: BIOPSY;  Surgeon: Juanita Craver, MD;  Location: [facility];  Service: Endoscopy;;   CARDIAC CATHETERIZATION  05/2004   COLONOSCOPY  07/2002   Archie Endo 09/02/2010   COLONOSCOPY WITH PROPOFOL N/A 10/28/2018   Procedure: COLONOSCOPY WITH PROPOFOL;  Surgeon: Juanita Craver, MD;  Location: [facility] ENDOSCOPY;  Service: Endoscopy;  Laterality: N/A;   ESOPHAGOGASTRODUODENOSCOPY  07/2002   w/biopsies/notes 09/02/2010   ESOPHAGOGASTRODUODENOSCOPY (EGD) WITH PROPOFOL N/A 10/28/2018   Procedure: ESOPHAGOGASTRODUODENOSCOPY (EGD) WITH PROPOFOL;  Surgeon: Juanita Craver, MD;  Location: [facility] ENDOSCOPY;  Service: Endoscopy;  Laterality: N/A;   HOT  HEMOSTASIS N/A 10/28/2018   Procedure: HOT HEMOSTASIS (ARGON PLASMA COAGULATION/BICAP);  Surgeon: Juanita Craver, MD;  Location: [facility] ENDOSCOPY;  Service: Endoscopy;  Laterality: N/A;   IR ANGIOGRAM SELECTIVE EACH ADDITIONAL VESSEL  10/30/2018   IR ANGIOGRAM SELECTIVE EACH ADDITIONAL VESSEL  10/30/2018   IR ANGIOGRAM SELECTIVE EACH ADDITIONAL VESSEL  10/30/2018   IR ANGIOGRAM SELECTIVE EACH ADDITIONAL VESSEL   10/30/2018   IR ANGIOGRAM VISCERAL SELECTIVE  10/30/2018   IR EMBO ART  VEN HEMORR LYMPH EXTRAV  INC GUIDE ROADMAPPING  10/30/2018   IR US GUIDANCE  10/30/2018   POLYPECTOMY  10/28/2018   Procedure: POLYPECTOMY;  Surgeon: Juanita Craver, MD;  Location: [facility] ENDOSCOPY;  Service: Endoscopy;;     Home Medications:  Prior to Admission medications   Medication Sig Start Date End Date Taking? Authorizing Provider  acetaminophen (TYLENOL) 325 MG tablet Take 650 mg by mouth every 6 (six) hours as needed for mild pain or headache.   Yes [provider]  atorvastatin (LIPITOR) 40 MG tablet Take 1 tablet (40 mg total) by mouth daily. 05/31/18  Yes Chambliss, Jeb Levering, MD  busPIRone (BUSPAR) 5 MG tablet Take 5 mg by mouth 2 (two) times daily as needed (anxiety attacks).  12/02/18  Yes Chambliss, Jeb Levering, MD  furosemide (LASIX) 40 MG tablet TAKE 1 TABLET BY MOUTH EVERY DAY Patient taking differently: Take 40 mg by mouth daily.  12/16/18  Yes Martyn Malay, MD  metoprolol tartrate (LOPRESSOR) 25 MG tablet TAKE 0.5 TABLETS (12.5 MG TOTAL) BY MOUTH 2 (TWO) TIMES DAILY. 12/22/18  Yes Chambliss, Jeb Levering, MD  Multiple Vitamins-Minerals (ADULT ONE DAILY GUMMIES) CHEW Chew 1 tablet by mouth daily.   Yes [provider]  omeprazole (PRILOSEC) 20 MG capsule TAKE 1 CAPSULE BY MOUTH DAILY AS NEEDED Patient taking differently: Take 20 mg by mouth at bedtime.  11/29/18  Yes [name], MD  traZODone (DESYREL) 50 MG tablet Take 0.5-1 tablets (25-50 mg total) by mouth at bedtime as needed for sleep. 12/23/18  Yes Chambliss, Jeb Levering, MD  vitamin B-12 100 MCG tablet Take 1 tablet (100 mcg total) by mouth daily. 12/11/18 01/10/19 Yes Gifford Shave, MD  sertraline (ZOLOFT) 100 MG tablet Take 1 tablet (100 mg total) by mouth daily. Patient not taking: Reported on 12/28/2018 12/23/18   [name], MD    Inpatient Medications: Scheduled Meds:  aspirin EC  81 mg Oral Daily    atorvastatin  40 mg Oral Daily   [START ON 12/29/2018] ferrous sulfate  325 mg Oral Q breakfast   metoprolol tartrate  12.5 mg Oral BID   nystatin cream   Topical BID   pantoprazole  40 mg Oral Daily   potassium chloride  40 mEq Oral Once   sertraline  100 mg Oral Daily   vitamin B-12  1,000 mcg Oral Daily   Continuous Infusions:  magnesium sulfate bolus IVPB     PRN Meds: acetaminophen **OR** acetaminophen, busPIRone, polyethylene glycol, traZODone  Allergies:   No Known Allergies  Social History:   Social History   Socioeconomic History   Marital status: Widowed    Spouse name: Chrissie Noa   Number of children: 4   Years of education: 12   Highest education level: High school graduate  Occupational History   Occupation: Retired     Comment: Environmental consultant strain: Not hard at [employer] insecurity    Worry: Never true    Inability:  Never true   Transportation needs    Medical: No    Non-medical: No  Tobacco Use   Smoking status: Former Smoker    Packs/day: 0.30    Years: 8.00    Pack years: 2.40    Types: Cigarettes    Quit date: 05/30/2004    Years since quitting: 14.5   Smokeless tobacco: Never Used  Substance and Sexual Activity   Alcohol use: No    Alcohol/week: 0.0 standard drinks   Drug use: Never   Sexual activity: Not Currently  Lifestyle   Physical activity    Days per week: 0 days    Minutes per session: 0 min   Stress: Not at all  Relationships   Social connections    Talks on phone: More than three times a week    Gets together: More than three times a week    Attends religious service: More than 4 times per year    Active member of club or organization: Yes    Attends meetings of clubs or organizations: Never    Relationship status: Widowed   Intimate partner violence    Fear of current or ex partner: No    Emotionally abused: No    Physically abused: No    Forced sexual activity: No  Other  Topics Concern   Not on file  Social History Narrative   Emergency Contact: [name] [phone]   End of Life Plan: gave pt ad pamplet   Any pets: 1 Dog    Diet: pt has a varied diet, low consumption of carbs and fatty foods    Exercise: pt does not have a regular exercise routine    Seatbelts: pt wears seat regularly in car   Hobbies: spending time with grandchildren      *Updated 09/26/2018*   Patients daughter [name] lives with her and assists her in driving to doctors apts and errands. Patient does have a renewed drivers licenses, but feels more comfortable riding with her daughter. Patient enjoys going out to eat and spending time with her grandchildren who live in [place]. Patient stated they come during the summer to see her. Patient is excited her son, Vicente Males, is about to finish school to become a Theme park manager.    The patient has great family support system.     Family History:    Family History  Problem Relation Age of Onset   Heart disease Mother        died at age 45   Breast cancer Sister    Cancer Sister        Breast   Hypertension Father    Hypertension Sister      ROS:  Please see the history of present illness.   All other ROS reviewed and negative.     Physical Exam/Data:   Vitals:   12/28/18 0630 12/28/18 0730 12/28/18 0933 12/28/18 1000  BP: (!) 115/55 (!) 123/58 (!) 113/54 (!) 98/50  Pulse: 90 95 93 95  Resp: 19 (!) 22 (!) 21 20  Temp:      TempSrc:      SpO2: 95% 96% 100% 100%   No intake or output data in the 24 hours ending 12/28/18 1251 Last 3 Weights 12/10/2018 12/09/2018 12/06/2018  Weight (lbs) 163 lb 14.4 oz 160 lb 15 oz 160 lb  Weight (kg) 74.345 kg 73 kg 72.576 kg     There is no height or weight on file to calculate BMI.  General: Pleasant, moderately obese elderly  white female well nourished, well developed, in no acute distress HEENT: normal Lymph: no adenopathy Neck: no JVD Endocrine:  No thryomegaly Vascular: No carotid bruits;  FA pulses 2+ bilaterally without bruits  Cardiac: Regular rate and rhythm, aortic sclerosis murmur at the right upper sternal border, mild murmur at the apex consistent with mitral regurgitation Lungs: Faint crackles at the bases bilaterally, otherwise clear to auscultation  Abd: soft, nontender, no hepatomegaly  Ext: no edema Musculoskeletal:  No deformities, BUE and BLE strength normal and equal Skin: warm and dry  Neuro:  CNs 2-12 intact, no focal abnormalities noted Psych:  Normal affect   EKG:  The EKG was personally reviewed and demonstrates:  Sinus tachycardia, ? RAE  Telemetry:  Telemetry was personally reviewed and demonstrates: Sinus tachycardia 102  Relevant CV Studies: 2D Echo 10/31/2018 IMPRESSIONS    1. The left ventricle has normal systolic function with an ejection fraction of 60-65%. The cavity size was normal. Left ventricular diastolic Doppler parameters are consistent with pseudonormalization.  2. The right ventricle has normal systolic function. The cavity was normal. There is no increase in right ventricular wall thickness.  3. Left atrial size was moderately dilated.  4. Mild thickening of the mitral valve leaflet. Mitral valve regurgitation is mild to moderate by color flow Doppler.  5. MR not seen well on TTE. Appears mild to moderate. Consider TEE if more information needed.  6. The tricuspid valve is grossly normal.  7. The aortic valve is tricuspid. Mild calcification of the aortic valve.  8. The inferior vena cava was dilated in size with >50% respiratory variability.  9. The interatrial septum was not well visualized.  Laboratory Data:  High Sensitivity Troponin:   Recent Labs  Lab 12/09/18 0355 12/09/18 0716 12/09/18 0944 12/28/18 0220 12/28/18 0507  TROPONINIHS 14 14 14  25* 48*     Chemistry Recent Labs  Lab 12/28/18 0220 12/28/18 0252  NA 135 139  K 3.9 3.4*  CL 103  --   CO2 22  --   GLUCOSE 306*  --   BUN 26*  --   CREATININE  1.69*  --   CALCIUM 8.4*  --   GFRNONAA 29*  --   GFRAA 34*  --   ANIONGAP 10  --     No results for input(s): PROT, ALBUMIN, AST, ALT, ALKPHOS, BILITOT in the last 168 hours. Hematology Recent Labs  Lab 12/28/18 0220 12/28/18 0252  WBC 16.3*  --   RBC 2.99*  --   HGB 8.3* 8.8*  HCT 30.5* 26.0*  MCV 102.0*  --   MCH 27.8  --   MCHC 27.2*  --   RDW 19.1*  --   PLT 387  --    BNP Recent Labs  Lab 12/28/18 0220  BNP 713.9*    DDimer No results for input(s): DDIMER in the last 168 hours.   Radiology/Studies:  Dg Chest Port 1 View  Result Date: 12/28/2018 CLINICAL DATA:  Shortness of breath EXAM: PORTABLE CHEST 1 VIEW COMPARISON:  12/18/2018 FINDINGS: Cardiomegaly with vascular congestion. Interstitial prominence within the lungs is similar to prior study and likely reflects interstitial edema. No visible significant effusions. No acute bony abnormality. IMPRESSION: Cardiomegaly with vascular congestion and stable interstitial prominence within the mid and lower lungs concerning for interstitial edema. Electronically Signed   By: Rolm Baptise M.D.   On: 12/28/2018 02:35    Assessment and Plan:   Hannah [name] is a 73 y.o. female with  a hx of chronic dCHF, HTN, HLD, h/o GIB requiring transfusion, chronic iron deficiency anemia, asthma, GERD, anxiety and dementia, who is being seen today for the evaluation of a/c dCHF, at the request of Dr. McDiarmid, Internal Medicine.   1.  Acute flash pulmonary edema: BNP elevated at 713, significantly higher than previous levels.  Baseline BNP in the 200 range.  Chest x-ray also shows cardiomegaly and pulmonary edema.  This is in the setting of normal LVF on recent echo and mitral regurgitation.  Symptomatic improvement after dose of IV Lasix in the ED.    We will continue Lasix IV 40 mg twice daily.    Strict I's and O's and daily weights.    Daily BMPs for close monitoring of renal function and electrolytes.  Given multiple ED  presentations and hospitalizations for acute flash pulmonary edema, we will plan for right and left heart catheterization tomorrow with LV gram to check severity of MR.  If cath is suggestive of significant MR, will later plan for TEE to better assess the valve  2. Acute kidney injury: Serum creatinine elevated at 1.6.  Previous baseline 1.1  We will continue IV Lasix 40 mg twice daily for pulmonary edema  Hopefully renal function will improve with diuresis  Monitor renal function closely.  Follow-up BMP in the a.m.  Avoid nephrotoxic agents  3.  Hypokalemia: Potassium 3.4.  In the setting of IV diuretics.  Give 40 mEq of K. Dur now.  Follow-up BMP in the a.m.  4.  Chronic iron deficiency anemia: Hemoglobin 8.8 consistent with baseline.  Management per primary team    For questions or updates, please contact [facility] Please consult [email or web address] for contact info under     Signed, Lyda Jester, PA-C  12/28/2018 12:51 PM   Patient examined chart reviewed discussed care with patient and PA. Multiple episodes of presumed diastolic dysfunction past year with elevated BNP and CHF. Episode of flash pulmonary edema last night. No chest pain but no recent ischemic evaluation Reviewed echo from July and although MR only looks mild to moderate may be transiently worse. ECG with chronic IVCD no acute changes. Favor right and left cath for pressures, ? V wave and R/O CAD If there is significant MR on V gram or large V wave would proceed with TEE to further assess MR. Exam is remarkable for persistent basilar rales. Mostly AV sclerosis murmur apex hard to hear due to rales and ER noise. Continue diuresis  Will recheck BMET in am but would proceed with cath so long as Cr stays below 2. Chronic anemia Since July stable  Jenkins Rouge

## 2018-12-28 NOTE — ED Notes (Signed)
Pt ambulated to bathroom with stand by assist. She tolerated well, shortness of breath noted when she returned from the bathroom.

## 2018-12-28 NOTE — ED Notes (Signed)
Spoke with Daughter [name], given an update. Admitting team notified to give an update.

## 2018-12-28 NOTE — ED Triage Notes (Addendum)
Patient arrived with EMS from home , worsening SOB , diaphoresis and pale this evening , history of CHF , received Epinephrine 0.3 mg IM by EMS.

## 2018-12-28 NOTE — Patient Outreach (Signed)
[facility]) Care Management  12/28/2018  Hannah [name] Feb 20, 1946 [id number]  Initial assessment Referral date:12/06/18 Referral source:utilization management Referral reason:Frequent ED visits Insurance:United health care  Attempt #2  Telephone call to patient's daughter, Marylon Verno. Unable to reach. HIPAA compliant voice message left with call back phone number.   PLAN: RNCM will attempt 3rd telephone call to patient's daughter within 4 business days.   Quinn Plowman RN,BSN,CCM [facility] Telephonic  [phone]

## 2018-12-28 NOTE — ED Notes (Signed)
Tele    Paged admitting to [facility]

## 2018-12-28 NOTE — H&P (Addendum)
[facility] Admission History and Physical Service Pager: [phone]  [name] Medical record number: [id number] Date of birth: 07/26/1945 Age: 73 y.o. Gender: female  Primary Care Provider: [name], MD Consultants: none Code Status: Full (confirmed on admission) Preferred Emergency Contact: [name]   Chief Complaint: SOB  Assessment and Plan: Hannah [name] is a 73 y.o. female presenting with SOB. PMH is significant for Dementia, HTN, HLD, GERD, HFpEF, OA, Anxiety, Anemia.  Shortness of breath: Patient with multiple ED visits for shortness of breath occurring in the middle of the night while sleeping.  This episode occurred while patient was up and walking.  She reports that she felt a pressure in her chest almost like she was having heart attack which is new for her.  EMS told the ED provider she was able to tolerate any sort of treatment including nebulizers or CPAP prior to coming to the ED, and they administered epinephrine 0.3 mg IM.  In the ED, CXR with cardiomegaly with vascular congestion and stable interstitial prominence within the mid and lower lungs concerning for interstitial edema. BNP 713.  Initial troponin 25, EKG with sinus tachycardia and wide QT.  ABG obtained with pH 7.295, PCO2 47.7, PO2 68.0, HCO3 23.5 c/w respiratory acidosis.  Patient initially required nonrebreather but on my exam is satting 96% on 2L [place] and is breathing comfortably.  Patient received Lasix 40 mg IV x1 in the ED along with Ativan 0.5 mg. Given CXR findings and BNP likely that this is related to heart failure exacerbation.  Patient also with history of anxiety induced dyspnea with hyperventilation that improved after receiving Ativan. COVID-19 negative. No signs of pneumonia on CXR however patient does have elevated WBC to 16.3 with no other signs of infection. Less likely PE with Wells score of 1.5.  Less likely MI, as patient has  no current chest pain and no ST changes noted on EKG, although EKG taken shortly after receiving epinephrine.  Patient with history of anemia, hemoglobin stable on admission at 8.3. -Admit to telemetry, attending Dr. MCDiarmid -Continuous pulse ox -Continue IV Lasix 40 mg, re-dose later today pending output -Daily weights, strict I's/O's -Trend troponins -Repeat EKG -PT/OT to eval and treat  HFpEF: Echocardiogram in July 2020 with EF of 60-65%.  CXR with findings of fluid overload. -s/p 40mg  IV Lasix in ED, re-dose pending output -Continue home metoprolol as below  Leukocytosis WBC elevated to 16.3 on admission, could be 2/2 to epinephrine.  No clear signs of infection and patient afebrile. -Continue to monitor CBC -Monitor for any signs of infection  AKI on CKD stage IIIb: Cr elevated at 1.69, previously has been ~1.0, but has been elevated during recent hospitalizations. Will avoid starting IV fluids this patient has concern for volume overload. -Trend BMP / urinary output -Replace electrolytes as indicated -Avoid nephrotoxic agents, ensure adequate renal perfusion  Hypertension: BP 148/91 on admission. Patient on home metoprolol 12.5 mg twice daily -Continue home metoprolol   Anemia: Hemoglobin 8.3 this admission.  Previously requiring transfusions during admissions due to GI blood loss. Transfusion threshold >7.  -Daily CBC -Monitor for any signs of bleeding  HLD: Patient on home atorvastatin 40 mg -Atorvastatin 40 mg  Anxiety: Meds include Zoloft 50 mg daily, BuSpar 5 mg, trazodone -Continue Zoloft 50 mg, Continue buspirone 5 mg, trazodone 25 mg at bedtime  Dementia: As noted above, patient has had multiple visits to the ED and hospital for perceived  SOB that seems to resolve. Patient does not have memory of any of these episodes. [name] has previously stated that her mother's healthcare was beyond her ability to manage, but was concerned about placing her  mother in a facility due to feelings of guilt and COVID19.  -PT/OT eval and treat   FEN/GI: Heart healthy/ carb modified Prophylaxis: SCDs, consider starting Lovenox if patient's hemoglobin remained stable in setting of recent GI bleed  Disposition: admit to telemetry  History of Present Illness:  Hannah [name] is a 73 y.o. female presenting with shortness of breath.  Patient states that she was sleeping when she got up to go to the bathroom and as she got up to go to the restroom while walking she began to feel extremely short of breath.  Patient states that she was gasping for air.  She reports that she then sat down at her nearby whirlpool on the steps in order to try to catch her breath but was still unable to catch her breath.  She states she has been to the hospital a few times for this shortness of breath but this was much worse than any previous events.  She states that her daughter was trying to help her to breathe but eventually EMS was called and they brought her here to the hospital.  She states that since she has been here she is already feeling much better.  She states that she does think there is some pressure on her chest and she felt like she might be having a heart attack which caused her to be more anxious.  She denies any recent cough, fever, chills, congestion.  Review Of Systems: Per HPI with the following additions:   Review of Systems  Constitutional: Negative for chills and fever.  HENT: Negative for congestion and sore throat.   Eyes: Negative for blurred vision and double vision.  Respiratory: Positive for shortness of breath. Negative for cough, sputum production and wheezing.   Cardiovascular: Positive for palpitations. Negative for chest pain, orthopnea and leg swelling.  Gastrointestinal: Negative for abdominal pain, nausea and vomiting.  Genitourinary: Negative for dysuria, frequency and urgency.  Musculoskeletal: Negative for falls.  Neurological: Negative  for dizziness, loss of consciousness and headaches.    Patient Active Problem List   Diagnosis Date Noted  . Chest pain 12/09/2018  . AKI (acute kidney injury) ([place])   . CHF (congestive heart failure) ([place]) 11/06/2018  . Acute pulmonary edema ([facility])   . Gastric bleed   . Acute anemia 10/27/2018  . Elevated TSH 02/03/2018  . Dementia due to atherosclerosis with behavioral disturbance ([place]) 02/02/2018  . Renal insufficiency 11/09/2017  . Gout 09/11/2015  . SOB (shortness of breath) 05/08/2015  . OBESITY 03/26/2010  . Anxiety state 08/09/2008  . Essential hypertension 08/09/2008  . GERD 08/09/2008    Past Medical History: Past Medical History:  Diagnosis Date  . Anemia   . Anxiety   . Arthritis    "hands" (05/09/2015)  . CHF (congestive heart failure) ([place])   . GERD (gastroesophageal reflux disease)   . High cholesterol   . History of blood transfusion 1960s; 05/08/2015   "when I had my kids; low HgB"  . Hypertension   . Osteoarthritis   . Pneumonia 05/2004    Past Surgical History: Past Surgical History:  Procedure Laterality Date  . BIOPSY  10/28/2018   Procedure: BIOPSY;  Surgeon: Juanita Craver, MD;  Location: [facility];  Service: Endoscopy;;  . CARDIAC CATHETERIZATION  05/2004  . COLONOSCOPY  07/2002   Archie Endo 09/02/2010  . COLONOSCOPY WITH PROPOFOL N/A 10/28/2018   Procedure: COLONOSCOPY WITH PROPOFOL;  Surgeon: Juanita Craver, MD;  Location: [facility] ENDOSCOPY;  Service: Endoscopy;  Laterality: N/A;  . ESOPHAGOGASTRODUODENOSCOPY  07/2002   w/biopsies/notes 09/02/2010  . ESOPHAGOGASTRODUODENOSCOPY (EGD) WITH PROPOFOL N/A 10/28/2018   Procedure: ESOPHAGOGASTRODUODENOSCOPY (EGD) WITH PROPOFOL;  Surgeon: Juanita Craver, MD;  Location: [facility] ENDOSCOPY;  Service: Endoscopy;  Laterality: N/A;  . HOT HEMOSTASIS N/A 10/28/2018   Procedure: HOT HEMOSTASIS (ARGON PLASMA COAGULATION/BICAP);  Surgeon: Juanita Craver, MD;  Location: [facility] ENDOSCOPY;  Service: Endoscopy;  Laterality: N/A;  . IR  ANGIOGRAM SELECTIVE EACH ADDITIONAL VESSEL  10/30/2018  . IR ANGIOGRAM SELECTIVE EACH ADDITIONAL VESSEL  10/30/2018  . IR ANGIOGRAM SELECTIVE EACH ADDITIONAL VESSEL  10/30/2018  . IR ANGIOGRAM SELECTIVE EACH ADDITIONAL VESSEL  10/30/2018  . IR ANGIOGRAM VISCERAL SELECTIVE  10/30/2018  . IR EMBO ART  VEN HEMORR LYMPH EXTRAV  INC GUIDE ROADMAPPING  10/30/2018  . IR US GUIDANCE  10/30/2018  . POLYPECTOMY  10/28/2018   Procedure: POLYPECTOMY;  Surgeon: Juanita Craver, MD;  Location: [facility] ENDOSCOPY;  Service: Endoscopy;;    Social History: Social History   Tobacco Use  . Smoking status: Former Smoker    Packs/day: 0.30    Years: 8.00    Pack years: 2.40    Types: Cigarettes    Quit date: 05/30/2004    Years since quitting: 14.5  . Smokeless tobacco: Never Used  Substance Use Topics  . Alcohol use: No    Alcohol/week: 0.0 standard drinks  . Drug use: Never   Additional social history: Lives with daughter [name] due to dementia  Please also refer to relevant sections of EMR.  Family History: Family History  Problem Relation Age of Onset  . Heart disease Mother        died at age 51  . Breast cancer Sister   . Cancer Sister        Breast  . Hypertension Father   . Hypertension Sister     Allergies and Medications: No Known Allergies No current facility-administered medications on file prior to encounter.    Current Outpatient Medications on File Prior to Encounter  Medication Sig Dispense Refill  . acetaminophen (TYLENOL) 325 MG tablet Take 650 mg by mouth every 6 (six) hours as needed for mild pain or headache.    Marland Kitchen atorvastatin (LIPITOR) 40 MG tablet Take 1 tablet (40 mg total) by mouth daily. 90 tablet 3  . busPIRone (BUSPAR) 5 MG tablet Take 5 mg by mouth 2 (two) times daily as needed (anxiety attacks).  60 tablet 1  . ferrous sulfate 325 (65 FE) MG tablet Take 325 mg by mouth daily with breakfast.    . furosemide (LASIX) 40 MG tablet TAKE 1 TABLET BY MOUTH EVERY DAY 90 tablet 1   . metoprolol tartrate (LOPRESSOR) 25 MG tablet TAKE 0.5 TABLETS (12.5 MG TOTAL) BY MOUTH 2 (TWO) TIMES DAILY. 60 tablet 1  . Multiple Vitamins-Minerals (ADULT ONE DAILY GUMMIES) CHEW Chew 1 tablet by mouth daily.    Marland Kitchen omeprazole (PRILOSEC) 20 MG capsule TAKE 1 CAPSULE BY MOUTH DAILY AS NEEDED (Patient taking differently: Take 20 mg by mouth at bedtime. ) 180 capsule 0  . sertraline (ZOLOFT) 100 MG tablet Take 1 tablet (100 mg total) by mouth daily. 90 tablet 1  . traZODone (DESYREL) 50 MG tablet Take 0.5-1 tablets (25-50 mg total) by mouth at bedtime as needed for  sleep. 30 tablet 3  . vitamin B-12 100 MCG tablet Take 1 tablet (100 mcg total) by mouth daily. 30 tablet 0    Objective: BP (!) 119/56   Pulse (!) 120   Temp (!) 96.8 F (36 C) (Temporal)   Resp (!) 37   SpO2 95%  Exam: General: NAD, pleasant Eyes: PERRL, EOMI, no conjunctival pallor or injection ENTM: Moist mucous membranes, no pharyngeal erythema or exudate Neck: Supple, no LAD Cardiovascular: Tachycardic, regular rhythm, no m/r/g, no LE edema Respiratory: Diminished breath sounds in bilateral bases, normal work of breathing on 2L [place] Gastrointestinal: soft, nontender, nondistended, normoactive BS MSK: moves 4 extremities equally Derm: no rashes appreciated Neuro: CN II-XII grossly intact Psych: AOx3, appropriate affect  Labs and Imaging: CBC BMET  Recent Labs  Lab 12/28/18 0220 12/28/18 0252  WBC 16.3*  --   HGB 8.3* 8.8*  HCT 30.5* 26.0*  PLT 387  --    Recent Labs  Lab 12/28/18 0220 12/28/18 0252  NA 135 139  K 3.9 3.4*  CL 103  --   CO2 22  --   BUN 26*  --   CREATININE 1.69*  --   GLUCOSE 306*  --   CALCIUM 8.4*  --      BNP 713.9 Troponin 25  Dg Chest Port 1 View  Result Date: 12/28/2018 CLINICAL DATA:  Shortness of breath EXAM: PORTABLE CHEST 1 VIEW COMPARISON:  12/18/2018 FINDINGS: Cardiomegaly with vascular congestion. Interstitial prominence within the lungs is similar to prior study and  likely reflects interstitial edema. No visible significant effusions. No acute bony abnormality. IMPRESSION: Cardiomegaly with vascular congestion and stable interstitial prominence within the mid and lower lungs concerning for interstitial edema. Electronically Signed   By: Rolm Baptise M.D.   On: 12/28/2018 02:35    Gracelee Stemmler, [place], DO 12/28/2018, 4:45 AM PGY-3, [place] Intern pager: [phone], text pages welcome

## 2018-12-29 ENCOUNTER — Other Ambulatory Visit: Payer: Self-pay

## 2018-12-29 ENCOUNTER — Inpatient Hospital Stay (HOSPITAL_COMMUNITY)
Admission: EM | Disposition: A | Discharge status: Home Health | Payer: Self-pay | Source: Home / Self Care | Attending: Family Medicine

## 2018-12-29 ENCOUNTER — Encounter (HOSPITAL_COMMUNITY): Payer: Self-pay | Admitting: Family Medicine

## 2018-12-29 DIAGNOSIS — I447 Left bundle-branch block, unspecified: Secondary | ICD-10-CM | POA: Diagnosis present

## 2018-12-29 DIAGNOSIS — I340 Nonrheumatic mitral (valve) insufficiency: Secondary | ICD-10-CM

## 2018-12-29 DIAGNOSIS — D649 Anemia, unspecified: Secondary | ICD-10-CM

## 2018-12-29 HISTORY — PX: RIGHT/LEFT HEART CATH AND CORONARY ANGIOGRAPHY: CATH118266

## 2018-12-29 LAB — POCT I-STAT 7, (LYTES, BLD GAS, ICA,H+H)
Bicarbonate: 25.4 mmol/L (ref 20.0–28.0)
Calcium, Ion: 1.23 mmol/L (ref 1.15–1.40)
HCT: 25 % — ABNORMAL LOW (ref 36.0–46.0)
Hemoglobin: 8.5 g/dL — ABNORMAL LOW (ref 12.0–15.0)
O2 Saturation: 98 %
Potassium: 4.1 mmol/L (ref 3.5–5.1)
Sodium: 141 mmol/L (ref 135–145)
TCO2: 27 mmol/L (ref 22–32)
pCO2 arterial: 42.8 mmHg (ref 32.0–48.0)
pH, Arterial: 7.382 (ref 7.350–7.450)
pO2, Arterial: 111 mmHg — ABNORMAL HIGH (ref 83.0–108.0)

## 2018-12-29 LAB — POCT I-STAT EG7
Bicarbonate: 25.8 mmol/L (ref 20.0–28.0)
Bicarbonate: 25.9 mmol/L (ref 20.0–28.0)
Calcium, Ion: 1.23 mmol/L (ref 1.15–1.40)
Calcium, Ion: 1.24 mmol/L (ref 1.15–1.40)
HCT: 25 % — ABNORMAL LOW (ref 36.0–46.0)
HCT: 25 % — ABNORMAL LOW (ref 36.0–46.0)
Hemoglobin: 8.5 g/dL — ABNORMAL LOW (ref 12.0–15.0)
Hemoglobin: 8.5 g/dL — ABNORMAL LOW (ref 12.0–15.0)
O2 Saturation: 60 %
O2 Saturation: 62 %
Potassium: 4.1 mmol/L (ref 3.5–5.1)
Potassium: 4.2 mmol/L (ref 3.5–5.1)
Sodium: 142 mmol/L (ref 135–145)
Sodium: 142 mmol/L (ref 135–145)
TCO2: 27 mmol/L (ref 22–32)
TCO2: 27 mmol/L (ref 22–32)
pCO2, Ven: 45.3 mmHg (ref 44.0–60.0)
pCO2, Ven: 46.2 mmHg (ref 44.0–60.0)
pH, Ven: 7.355 (ref 7.250–7.430)
pH, Ven: 7.365 (ref 7.250–7.430)
pO2, Ven: 33 mmHg (ref 32.0–45.0)
pO2, Ven: 34 mmHg (ref 32.0–45.0)

## 2018-12-29 LAB — CBC
HCT: 24.5 % — ABNORMAL LOW (ref 36.0–46.0)
Hemoglobin: 7.1 g/dL — ABNORMAL LOW (ref 12.0–15.0)
MCH: 27.8 pg (ref 26.0–34.0)
MCHC: 29 g/dL — ABNORMAL LOW (ref 30.0–36.0)
MCV: 96.1 fL (ref 80.0–100.0)
Platelets: 260 10*3/uL (ref 150–400)
RBC: 2.55 MIL/uL — ABNORMAL LOW (ref 3.87–5.11)
RDW: 18.9 % — ABNORMAL HIGH (ref 11.5–15.5)
WBC: 6.2 10*3/uL (ref 4.0–10.5)
nRBC: 0 % (ref 0.0–0.2)

## 2018-12-29 LAB — BASIC METABOLIC PANEL
Anion gap: 10 (ref 5–15)
BUN: 34 mg/dL — ABNORMAL HIGH (ref 8–23)
CO2: 23 mmol/L (ref 22–32)
Calcium: 8.4 mg/dL — ABNORMAL LOW (ref 8.9–10.3)
Chloride: 106 mmol/L (ref 98–111)
Creatinine, Ser: 1.59 mg/dL — ABNORMAL HIGH (ref 0.44–1.00)
GFR calc Af Amer: 36 mL/min — ABNORMAL LOW (ref >60)
GFR calc non Af Amer: 31 mL/min — ABNORMAL LOW (ref >60)
Glucose, Bld: 113 mg/dL — ABNORMAL HIGH (ref 70–99)
Potassium: 4 mmol/L (ref 3.5–5.1)
Sodium: 139 mmol/L (ref 135–145)

## 2018-12-29 LAB — PREPARE RBC (CROSSMATCH)

## 2018-12-29 SURGERY — RIGHT/LEFT HEART CATH AND CORONARY ANGIOGRAPHY
Anesthesia: LOCAL

## 2018-12-29 MED ORDER — FUROSEMIDE 10 MG/ML IJ SOLN
20.0000 mg | Freq: Once | INTRAMUSCULAR | Status: DC
  Filled 2018-12-29: qty 2

## 2018-12-29 MED ORDER — HEPARIN (PORCINE) IN NACL 1000-0.9 UT/500ML-% IV SOLN
INTRAVENOUS | Status: DC | PRN
  Administered 2018-12-29 (×2): 500 mL

## 2018-12-29 MED ORDER — LIDOCAINE HCL (PF) 1 % IJ SOLN
INTRAMUSCULAR | Status: AC
  Filled 2018-12-29: qty 30

## 2018-12-29 MED ORDER — SODIUM CHLORIDE 0.9% FLUSH: 3.0000 mL | INTRAVENOUS | Status: DC | PRN

## 2018-12-29 MED ORDER — SODIUM CHLORIDE 0.9 % IV SOLN
INTRAVENOUS | Status: DC
  Administered 2018-12-29: 05:00:00 via INTRAVENOUS

## 2018-12-29 MED ORDER — SODIUM CHLORIDE 0.9% IV SOLUTION: Freq: Once | INTRAVENOUS | Status: DC

## 2018-12-29 MED ORDER — FUROSEMIDE 10 MG/ML IJ SOLN
40.0000 mg | Freq: Two times a day (BID) | INTRAMUSCULAR | Status: DC
  Administered 2018-12-30 – 2018-12-31 (×4): 40 mg via INTRAVENOUS
  Filled 2018-12-29 (×4): qty 4

## 2018-12-29 MED ORDER — SODIUM CHLORIDE 0.9 % IV SOLN: INTRAVENOUS | Status: AC

## 2018-12-29 MED ORDER — HEPARIN SODIUM (PORCINE) 1000 UNIT/ML IJ SOLN
INTRAMUSCULAR | Status: DC | PRN
  Administered 2018-12-29: 4000 [IU] via INTRAVENOUS

## 2018-12-29 MED ORDER — VERAPAMIL HCL 2.5 MG/ML IV SOLN
INTRAVENOUS | Status: AC
  Filled 2018-12-29: qty 2

## 2018-12-29 MED ORDER — FENTANYL CITRATE (PF) 100 MCG/2ML IJ SOLN
INTRAMUSCULAR | Status: AC
  Filled 2018-12-29: qty 2

## 2018-12-29 MED ORDER — SODIUM CHLORIDE 0.9% FLUSH
3.0000 mL | Freq: Two times a day (BID) | INTRAVENOUS | Status: DC
  Administered 2018-12-29 – 2019-01-01 (×5): 3 mL via INTRAVENOUS

## 2018-12-29 MED ORDER — LIDOCAINE HCL (PF) 1 % IJ SOLN
INTRAMUSCULAR | Status: DC | PRN
  Administered 2018-12-29 (×2): 2 mL

## 2018-12-29 MED ORDER — VERAPAMIL HCL 2.5 MG/ML IV SOLN
INTRAVENOUS | Status: DC | PRN
  Administered 2018-12-29: 10 mL via INTRA_ARTERIAL

## 2018-12-29 MED ORDER — HEPARIN (PORCINE) IN NACL 1000-0.9 UT/500ML-% IV SOLN
INTRAVENOUS | Status: AC
  Filled 2018-12-29: qty 1000

## 2018-12-29 MED ORDER — IOHEXOL 350 MG/ML SOLN
INTRAVENOUS | Status: DC | PRN
  Administered 2018-12-29: 50 mL

## 2018-12-29 MED ORDER — MIDAZOLAM HCL 2 MG/2ML IJ SOLN
INTRAMUSCULAR | Status: AC
  Filled 2018-12-29: qty 2

## 2018-12-29 MED ORDER — HEPARIN SODIUM (PORCINE) 5000 UNIT/ML IJ SOLN: 5000.0000 [IU] | Freq: Three times a day (TID) | INTRAMUSCULAR | Status: DC

## 2018-12-29 MED ORDER — HEPARIN SODIUM (PORCINE) 1000 UNIT/ML IJ SOLN
INTRAMUSCULAR | Status: AC
  Filled 2018-12-29: qty 1

## 2018-12-29 MED ORDER — SODIUM CHLORIDE 0.9% FLUSH
3.0000 mL | Freq: Two times a day (BID) | INTRAVENOUS | Status: DC
  Administered 2018-12-29 – 2019-01-01 (×6): 3 mL via INTRAVENOUS

## 2018-12-29 MED ORDER — SODIUM CHLORIDE 0.9 % IV SOLN: 250.0000 mL | INTRAVENOUS | Status: DC | PRN

## 2018-12-29 SURGICAL SUPPLY — 14 items
CATH BALLN WEDGE 5F 110CM (CATHETERS) ×2 IMPLANT
CATH INFINITI 5FR ANG PIGTAIL (CATHETERS) ×2 IMPLANT
CATH OPTITORQUE TIG 4.0 5F (CATHETERS) ×2 IMPLANT
DEVICE RAD COMP TR BAND LRG (VASCULAR PRODUCTS) ×2 IMPLANT
GLIDESHEATH SLEND SS 6F .021 (SHEATH) ×2 IMPLANT
GUIDEWIRE .025 260CM (WIRE) ×2 IMPLANT
GUIDEWIRE INQWIRE 1.5J.035X260 (WIRE) ×1 IMPLANT
INQWIRE 1.5J .035X260CM (WIRE) ×2
KIT HEART LEFT (KITS) ×2 IMPLANT
PACK CARDIAC CATHETERIZATION (CUSTOM PROCEDURE TRAY) ×2 IMPLANT
SHEATH GLIDE SLENDER 4/5FR (SHEATH) ×2 IMPLANT
SYR MEDRAD MARK 7 150ML (SYRINGE) ×2 IMPLANT
TRANSDUCER W/STOPCOCK (MISCELLANEOUS) ×2 IMPLANT
TUBING CIL FLEX 10 FLL-RA (TUBING) ×2 IMPLANT

## 2018-12-29 NOTE — Progress Notes (Signed)
Family Medicine Teaching Service Daily Progress Note Intern Pager: [phone]  [name] Medical record number: [id number] Date of birth: [birth year] Age: 73 y.o. Gender: female  Primary Care Provider: [name], MD Consultants: Cardiology Code Status: Full  Pt Overview and Major Events to Date:  9/9- admitted 9/10- R/L heart cath  Assessment and Plan: Hannah [name] is a 73 y.o. female presenting with SOB due to acute on chronic HFpEF with flash pulmonary edema. PMH is significant for Dementia,HTN, HLD, GERD, HFpEF, OA, Anxiety, Anemia.  Acute on chronic HFpEF with flash pulmonary edema: R/L heart cath today, will reassess for lasix s/p cath. Patient is satting 96% on RA, sitting up comfortably and looking much more alert today. Patient received Lasix 40 mg IV, UOP 1.8L, lost 1.5kg and weight today is 76.2 kg (77.7 kg yesterday). Dry weight is difficult to assess as she has had many admissions for flash pulmonary edema this year, but weight at last discharge in August was 72.6 kg. CXR with findings of fluid overload. BNP elevated to 713, previously in the 200s.  Initial troponin 25>48>30, EKG with sinus tachycardia, wide QT, and old LBBB.  On admission, ABG obtained with pH 7.295, PCO2 47.7, PO2 68.0, HCO3 23.5 c/w respiratory acidosis and metabolic acidosis. Echocardiogram in July 2020 with EF of 60-65%. -Admit to telemetry, attending Dr. McDiarmid -Continuous pulse ox - AVOID IVF FOR FLASH PULMONARY EDEMA -will reassess for lasix after heart cath -continue home metoprolol -Daily weights, strict I's/O's -Cardiology following, appreciate recommendations -repeat echo this admission -PT/OT to eval and treat  Macrocytic Anemia: Hemoglobin down to 7.1 this morning, asymptomatic, and recent history of GI bleed with embolization.  Status post aspirin and heparin from consulting team.  Hemoglobin 8.3 on admission with MCV 110, and MCV 96 today.  Previously  requiring transfusions during admissions due to GI blood loss. Transfusion threshold >7. Vit B12 decreased to 173. Anemia labs: Fe 21, TIBC 393, Ferritin 55. RBC 2.6 and reticulocyte absolute count 103.7. - avoid antiplatelet and anticoagulation therapy 2/2 GI bleeding and Hgb of 7.1 - likely will transfuse tomorrow -Daily CBC -Monitor for any signs of bleeding  -Increased Vit B12 to 1000mg   AKI on CKD stage IIIb: Cr elevated at 1.69 improving today to 1.59, previously has been ~1.0, but has been elevated during recent hospitalizations. Will avoid starting IV fluids this patient has concern for volume overload. UOP appropriate with 1.8L. - avoid IVF for flash pulmonary edema -Trend BMP / urinary output -Replace electrolytes as indicated -Avoid nephrotoxic agents, ensure adequate renal perfusion  Leukocytosis WBC elevated to 16.3 on admission down to 6.2 today, likely secondary to epinephrine.  No signs of infection and patient afebrile. -Continue to monitor CBC -Monitor for any signs of infection  Hypertension: BP 148/91 on admission, normotensive today 115/82. Patient on home metoprolol 12.5 mg twice daily -Continue home metoprolol  HLD: Patient on home atorvastatin 40 mg -Atorvastatin 40 mg  Anxiety: Meds include Zoloft 50 mg daily, BuSpar 5 mg, trazodone -Continue buspirone 5 mg, trazodone 25 mg at bedtime  -Patient refusing zoloft  Dementia: As noted above, patient has had multiple visits to the ED and hospital for perceived SOB that seems to resolve. Patient does not have memory of any of these episodes. [name] has previously stated that her mother's healthcare was beyond her ability to manage, but was concerned about placing her mother in a facility due to feelings of guilt and COVID19.  -PT/OT eval and treat  FEN/GI: Heart healthy/ carb modified Prophylaxis: SCDs, no anticoagulation 2/2 hgb 7.1 and GIB  Disposition: Telemetry  Subjective: Feels much better  today, sitting up, talking in complete sentences, on room air and stable.  Objective: Temp:  [98 F (36.7 C)-98.9 F (37.2 C)] 98.1 F (36.7 C) (09/10 0518) Pulse Rate:  [88-103] 100 (09/10 0518) Resp:  [18-29] 20 (09/10 0518) BP: (92-119)/(47-82) 113/82 (09/10 0518) SpO2:  [94 %-100 %] 94 % (09/10 0518) Weight:  [76.2 kg-77.7 kg] 76.2 kg (09/10 0514) Physical Exam: General: Well-appearing, pleasant, no acute distress Cardiovascular: Regular rate and rhythm, no rub, gallop Respiratory: Clear to auscultation bilaterally, minimally diminished breath sounds in bilateral bases, normal work of breathing on room air Abdomen: Soft, nontender, nondistended Extremities: Moves all 4 extremities equally, trace edema to mid shin bilaterally Psych: Good mood, AO x2, short-term memory affected by dementia, appropriate affect  Laboratory: Recent Labs  Lab 12/28/18 0220 12/28/18 0252 12/29/18 0501  WBC 16.3*  --  6.2  HGB 8.3* 8.8* 7.1*  HCT 30.5* 26.0* 24.5*  PLT 387  --  260   Recent Labs  Lab 12/28/18 0220 12/28/18 0252 12/29/18 0501  NA 135 139 139  K 3.9 3.4* 4.0  CL 103  --  106  CO2 22  --  23  BUN 26*  --  34*  CREATININE 1.69*  --  1.59*  CALCIUM 8.4*  --  8.4*  GLUCOSE 306*  --  113*    Imaging/Diagnostic Tests: No results found.   [name], MD 12/29/2018, 7:47 AM PGY-1 [place] Intern pager: [phone] text pages welcome

## 2018-12-29 NOTE — H&P (View-Only) (Signed)
Subjective:   Breathing better   Objective:  Vitals:   12/28/18 2341 12/29/18 0234 12/29/18 0514 12/29/18 0518  BP: 110/60   113/82  Pulse: 88   100  Resp: 18   20  Temp: 98 F (36.7 C)   98.1 F (36.7 C)  TempSrc: Oral   Oral  SpO2: 98%   94%  Weight:  76.6 kg 76.2 kg   Height:        Intake/Output from previous day:  Intake/Output Summary (Last 24 hours) at 12/29/2018 0847 Last data filed at 12/29/2018 0546 Gross per 24 hour  Intake 139.36 ml  Output 1800 ml  Net -1660.64 ml    Physical Exam:  Affect appropriate Healthy:  appears stated age HEENT: normal Neck supple with no adenopathy JVP normal no bruits no thyromegaly Lungs clear with no wheezing and good diaphragmatic motion Heart:  S1/S2 SEM  murmur, no rub, gallop or click PMI normal Abdomen: benighn, BS positve, no tenderness, no AAA no bruit.  No HSM or HJR Distal pulses intact with no bruits No edema Neuro non-focal Skin warm and dry No muscular weakness   Lab Results: Basic Metabolic Panel: Recent Labs    12/28/18 0220 12/28/18 0252 12/28/18 0507 12/29/18 0501  NA 135 139  --  139  K 3.9 3.4*  --  4.0  CL 103  --   --  106  CO2 22  --   --  23  GLUCOSE 306*  --   --  113*  BUN 26*  --   --  34*  CREATININE 1.69*  --   --  1.59*  CALCIUM 8.4*  --   --  8.4*  MG  --   --  1.7  --    Liver Function Tests: No results for input(s): AST, ALT, ALKPHOS, BILITOT, PROT, ALBUMIN in the last 72 hours. No results for input(s): LIPASE, AMYLASE in the last 72 hours. CBC: Recent Labs    12/28/18 0220 12/28/18 0252 12/29/18 0501  WBC 16.3*  --  6.2  NEUTROABS 10.9*  --   --   HGB 8.3* 8.8* 7.1*  HCT 30.5* 26.0* 24.5*  MCV 102.0*  --  96.1  PLT 387  --  260    Anemia Panel: Recent Labs    12/28/18 1827  VITAMINB12 173*  FOLATE 13.4  FERRITIN 55  TIBC 393  IRON 21*  RETICCTPCT 4.0*    Imaging: Dg Chest Port 1 View  Result Date: 12/28/2018 CLINICAL DATA:  Shortness of breath  EXAM: PORTABLE CHEST 1 VIEW COMPARISON:  12/18/2018 FINDINGS: Cardiomegaly with vascular congestion. Interstitial prominence within the lungs is similar to prior study and likely reflects interstitial edema. No visible significant effusions. No acute bony abnormality. IMPRESSION: Cardiomegaly with vascular congestion and stable interstitial prominence within the mid and lower lungs concerning for interstitial edema. Electronically Signed   By: Rolm Baptise M.D.   On: 12/28/2018 02:35    Cardiac Studies:  ECG: ST no acute ST changes    Telemetry:  NSR 12/29/2018   Echo: 10/31/18 EF 60-65% ? Mild to moderate MR   Medications:   . atorvastatin  40 mg Oral Daily  . ferrous sulfate  325 mg Oral Q breakfast  . furosemide  40 mg Intravenous Q12H  . metoprolol tartrate  12.5 mg Oral BID  . nystatin cream   Topical BID  . pantoprazole  40 mg Oral Daily  . sodium chloride flush  3 mL Intravenous  Q12H  . vitamin B-12  1,000 mcg Oral Daily     . sodium chloride    . sodium chloride 50 mL/hr at 12/29/18 0522    Assessment/Plan:   CHF:  Diastolic recurrent. BP ok no history of CAD. Degree of MR may be worse than suggested on TTE done in July. For right and left cath today to assess EDP, pulmonary pressures and r/o CAD If she has large V wave or significant MR on LV gram can f/u with TEE. Cr is stable this am for cath and had good diuresis over night   Hannah [name] 12/29/2018, 8:47 AM

## 2018-12-29 NOTE — Progress Notes (Signed)
Subjective:   Breathing better   Objective:  Vitals:   12/28/18 2341 12/29/18 0234 12/29/18 0514 12/29/18 0518  BP: 110/60   113/82  Pulse: 88   100  Resp: 18   20  Temp: 98 F (36.7 C)   98.1 F (36.7 C)  TempSrc: Oral   Oral  SpO2: 98%   94%  Weight:  76.6 kg 76.2 kg   Height:        Intake/Output from previous day:  Intake/Output Summary (Last 24 hours) at 12/29/2018 0847 Last data filed at 12/29/2018 0546 Gross per 24 hour  Intake 139.36 ml  Output 1800 ml  Net -1660.64 ml    Physical Exam:  Affect appropriate Healthy:  appears stated age HEENT: normal Neck supple with no adenopathy JVP normal no bruits no thyromegaly Lungs clear with no wheezing and good diaphragmatic motion Heart:  S1/S2 SEM  murmur, no rub, gallop or click PMI normal Abdomen: benighn, BS positve, no tenderness, no AAA no bruit.  No HSM or HJR Distal pulses intact with no bruits No edema Neuro non-focal Skin warm and dry No muscular weakness   Lab Results: Basic Metabolic Panel: Recent Labs    12/28/18 0220 12/28/18 0252 12/28/18 0507 12/29/18 0501  NA 135 139  --  139  K 3.9 3.4*  --  4.0  CL 103  --   --  106  CO2 22  --   --  23  GLUCOSE 306*  --   --  113*  BUN 26*  --   --  34*  CREATININE 1.69*  --   --  1.59*  CALCIUM 8.4*  --   --  8.4*  MG  --   --  1.7  --    Liver Function Tests: No results for input(s): AST, ALT, ALKPHOS, BILITOT, PROT, ALBUMIN in the last 72 hours. No results for input(s): LIPASE, AMYLASE in the last 72 hours. CBC: Recent Labs    12/28/18 0220 12/28/18 0252 12/29/18 0501  WBC 16.3*  --  6.2  NEUTROABS 10.9*  --   --   HGB 8.3* 8.8* 7.1*  HCT 30.5* 26.0* 24.5*  MCV 102.0*  --  96.1  PLT 387  --  260    Anemia Panel: Recent Labs    12/28/18 1827  VITAMINB12 173*  FOLATE 13.4  FERRITIN 55  TIBC 393  IRON 21*  RETICCTPCT 4.0*    Imaging: Dg Chest Port 1 View  Result Date: 12/28/2018 CLINICAL DATA:  Shortness of breath  EXAM: PORTABLE CHEST 1 VIEW COMPARISON:  12/18/2018 FINDINGS: Cardiomegaly with vascular congestion. Interstitial prominence within the lungs is similar to prior study and likely reflects interstitial edema. No visible significant effusions. No acute bony abnormality. IMPRESSION: Cardiomegaly with vascular congestion and stable interstitial prominence within the mid and lower lungs concerning for interstitial edema. Electronically Signed   By: Rolm Baptise M.D.   On: 12/28/2018 02:35    Cardiac Studies:  ECG: ST no acute ST changes    Telemetry:  NSR 12/29/2018   Echo: 10/31/18 EF 60-65% ? Mild to moderate MR   Medications:   . atorvastatin  40 mg Oral Daily  . ferrous sulfate  325 mg Oral Q breakfast  . furosemide  40 mg Intravenous Q12H  . metoprolol tartrate  12.5 mg Oral BID  . nystatin cream   Topical BID  . pantoprazole  40 mg Oral Daily  . sodium chloride flush  3 mL Intravenous  Q12H  . vitamin B-12  1,000 mcg Oral Daily     . sodium chloride    . sodium chloride 50 mL/hr at 12/29/18 0522    Assessment/Plan:   CHF:  Diastolic recurrent. BP ok no history of CAD. Degree of MR may be worse than suggested on TTE done in July. For right and left cath today to assess EDP, pulmonary pressures and r/o CAD If she has large V wave or significant MR on LV gram can f/u with TEE. Cr is stable this am for cath and had good diuresis over night   Hannah [name] 12/29/2018, 8:47 AM

## 2018-12-29 NOTE — Progress Notes (Signed)
PT Cancellation Note  Patient Details Name: Hannah [name] MRN: [id number] DOB: January 23, 1946   Cancelled Treatment:    Reason Eval/Treat Not Completed: Patient at procedure or test/unavailable.  Pt gone to Cath Lab.  Will try to see pt later today as able. 12/29/2018  Donnella Sham, [place] [phone]  (pager) [phone]  (office)   Tessie Fass [name] 12/29/2018, 12:27 PM

## 2018-12-29 NOTE — Progress Notes (Signed)
Patient refused SCDs at this time.  Family Medicine notified.

## 2018-12-29 NOTE — Progress Notes (Signed)
Patient is confused to place and time (at times to situation).   The RN and the NT have explained multiple times the importance of keeping the tele box on but pt continues to pull off just as soon as we walk out of the room.   We have tried placing electrodes on back, but pt pulls box and leads come off.   Patient also jumps out of bed (bed alarm on) but leads fall off.   Patient will not allow [place] to replace leads until she finishes in the bathroom.    CCMD has called - at times RN has not been able to answer due to talking to MD or family members.

## 2018-12-29 NOTE — Progress Notes (Signed)
Patient got up to go to bathroom - pulled IV out.  Tried to give fluids through 2nd IV, but would not flush.   Placed order for IV team.

## 2018-12-29 NOTE — Progress Notes (Addendum)
Called MD= spoke to night time PA.   Asked them to call patient's daughter to explain cardiac cath results and to go over reason for TEE.   Pt's daughter would need to sign consent (Verbal) d/t patient being confused.   MD said she would call

## 2018-12-29 NOTE — Evaluation (Signed)
Occupational Therapy Evaluation Patient Details Name: Hannah [name] MRN: [id number] DOB: Mar 12, 1946 Today's Date: 12/29/2018    History of Present Illness Hannah [name] is a 73 y.o. female presenting with SOB. PMH is significant for Dementia, HTN, HLD, GERD, HFpEF, OA, Anxiety, Anemia.   Clinical Impression   Pt admitted with above diagnoses, mostly limited in BADL ind 2/2 decreased activity tolerance and baseline cognitive deficits. PTA pt living with dtr and granddtr, completing BADL ind with assuming some safety cues. Per chart review and RN some medication compliance was a concern at home given pt memory. At time of eval, memory deficits notable as well as with divided attention. Pt completing bed mobility and transfers at supervision level, as well as toilet t/f. Pt also demonstrates ability to LB dress with set up A. Recommending HH for safety and compensatory strategies in the home in regards to cognition, as well as for increased activity tolerance and BADL ind. Will continue to follow per POC listed below.     Follow Up Recommendations  Home health OT;Supervision/Assistance - 24 hour    Equipment Recommendations  None recommended by OT    Recommendations for Other Services       Precautions / Restrictions Precautions Precautions: Fall Restrictions Weight Bearing Restrictions: No      Mobility Bed Mobility Overal bed mobility: Needs Assistance Bed Mobility: Supine to Sit     Supine to sit: Supervision        Transfers Overall transfer level: Needs assistance Equipment used: None Transfers: Sit to/from Stand Sit to Stand: Supervision         General transfer comment: management of lines for safety    Balance Overall balance assessment: Needs assistance Sitting-balance support: No upper extremity supported;Feet supported Sitting balance-Leahy Scale: Good     Standing balance support: No upper extremity supported Standing balance-Leahy Scale:  Fair                             ADL either performed or assessed with clinical judgement   ADL Overall ADL's : Needs assistance/impaired Eating/Feeding: Set up;Sitting;Cueing for safety   Grooming: Supervision/safety;Standing;Oral care;Wash/dry hands   Upper Body Bathing: Sitting;Standing;Supervision/ safety   Lower Body Bathing: Supervison/ safety;Sit to/from stand   Upper Body Dressing : Set up;Sitting   Lower Body Dressing: Set up;Sit to/from stand Lower Body Dressing Details (indicate cue type and reason): to don socks in recliner chair Toilet Transfer: Supervision/safety;Regular Glass blower/designer Details (indicate cue type and reason): management of lines, decreased awareness of lines/safety Toileting- Clothing Manipulation and Hygiene: Modified independent   Tub/ Shower Transfer: Supervision/safety;Shower seat   Functional mobility during ADLs: Supervision/safety General ADL Comments: pt mostly limited 2/2 baseline cognitive deficits associated with dementia     Vision Patient Visual Report: No change from baseline       Perception     Praxis      Pertinent Vitals/Pain Pain Assessment: No/denies pain     Hand Dominance     Extremity/Trunk Assessment Upper Extremity Assessment Upper Extremity Assessment: Generalized weakness   Lower Extremity Assessment Lower Extremity Assessment: Defer to PT evaluation       Communication Communication Communication: No difficulties   Cognition Arousal/Alertness: Awake/alert Behavior During Therapy: WFL for tasks assessed/performed Overall Cognitive Status: History of cognitive impairments - at baseline  General Comments: Hx of dementia, alert and oriented excpet for situation and time (states its 2013 but easily redirectable). Notable memory deficits and difficulty with dividing attention   General Comments       Exercises     Shoulder Instructions       Home Living Family/patient expects to be discharged to:: Private residence Living Arrangements: Children(dtr and granddtr) Available Help at Discharge: Family;Available 24 hours/day(dtr works from home currently) Type of Home: House Home Access: Ramped entrance;Stairs to enter [employer] of Steps: 5 Entrance Stairs-Rails: None Home Layout: One level     Bathroom Shower/Tub: [place]: Environmental consultant - 2 wheels;Cane - quad;Wheelchair - manual          Prior Functioning/Environment Level of Independence: Needs assistance  Gait / Transfers Assistance Needed: pt completes functional mobility without device ADL's / Homemaking Assistance Needed: pt reports she does her own meds, but per chart review this is starting to become a concern with complaince and safety. Suspect safety concerns for other higher level IADL            OT Problem List: Decreased strength;Impaired balance (sitting and/or standing);Decreased cognition;Decreased activity tolerance;Decreased safety awareness;Cardiopulmonary status limiting activity      OT Treatment/Interventions: Self-care/ADL training;DME and/or AE instruction;Patient/family education;Balance training;Therapeutic exercise;Energy conservation;Therapeutic activities;Cognitive remediation/compensation    OT Goals(Current goals can be found in the care plan section) Acute Rehab OT Goals Patient Stated Goal: to go home and see family OT Goal Formulation: With patient Time For Goal Achievement: 01/12/19 Potential to Achieve Goals: Good  OT Frequency: Min 2X/week   Barriers to D/C:            Co-evaluation              AM-PAC OT "6 Clicks" Daily Activity     Outcome Measure Help from another person eating meals?: None Help from another person taking care of personal grooming?: A Little Help from another person toileting, which includes using toliet, bedpan, or urinal?: A Little Help from another  person bathing (including washing, rinsing, drying)?: A Little Help from another person to put on and taking off regular upper body clothing?: A Little Help from another person to put on and taking off regular lower body clothing?: None 6 Click Score: 20   End of Session Equipment Utilized During Treatment: Gait belt Nurse Communication: Mobility status  Activity Tolerance: Patient tolerated treatment well Patient left: in chair;with call bell/phone within reach;with chair alarm set;with nursing/sitter in room  OT Visit Diagnosis: Unsteadiness on feet (R26.81);Muscle weakness (generalized) (M62.81)                Time: 9470-9628 OT Time Calculation (min): 25 min Charges:  OT General Charges $OT Visit: 1 Visit OT Evaluation $OT Eval Moderate Complexity: 1 Mod OT Treatments $Self Care/Home Management : 23-37 mins  Zenovia Jarred, MSOT, OTR/L Behavioral Health OT/ Acute Relief OT [facility] Office: [phone]   Zenovia Jarred 12/29/2018, 11:43 AM

## 2018-12-29 NOTE — Consult Note (Signed)
   [facility] CM Inpatient Consult   12/29/2018  Hannah [name] 1945-05-09 [id number]  Active: Less than 30 days readmission, showing as extreme high scores for unplanned readmission.  Patient is currently active with [facility] for chronic disease management services.  Patient has been engaged by a [place] Coordinator.    Chart review reveals per MD notes:  Hannah [name] a 73 y.o.femalepresenting with SOB due to acute on chronic HFpEF with flash pulmonary edema. PMH is significant forDementia,HTN, HLD, GERD,HFpEF,OA, Anxiety, Anemia.  Patient was in procedure.  Plan: Following for disposition needs and to Inpatient [facility] team member to make aware that [place] [facility] following.   Of note, [facility] Care Management services does not replace or interfere with any services that are needed or arranged by inpatient [facility] care management team.  For additional questions or referrals please contact:  Natividad Brood, RN BSN [facility] Liaison  [phone] business mobile phone Toll free office [phone]  Fax number: [phone] [email or web address]

## 2018-12-29 NOTE — Interval H&P Note (Signed)
History and Physical Interval Note:  12/29/2018 11:36 AM  Hannah [name]  has presented today for surgery, with the diagnosis of hf-mr.  The various methods of treatment have been discussed with the patient and family.   She was noted to have a drop in her hemoglobin down to 7.1.  Has 1 unit of PRBC ordered. Renal function is not back to baseline, but appears to be stable.  After consideration of risks, benefits and other options for treatment, the patient has consented to  Procedure(s): RIGHT/LEFT HEART CATH AND CORONARY ANGIOGRAPHY (N/A) as a surgical intervention.  The patient's history has been reviewed, patient examined, no change in status, stable for surgery.  I have reviewed the patient's chart and labs.  Questions were answered to the patient's satisfaction.     Glenetta Hew

## 2018-12-29 NOTE — Evaluation (Signed)
Physical Therapy Evaluation Patient Details Name: NETANYA YAZDANI MRN: [id number] DOB: [birth year] Today's Date: 12/29/2018   History of Present Illness  Hannah [name] is a 73 y.o. female presenting with SOB. PMH is significant for Dementia, HTN, HLD, GERD, HFpEF, OA, Anxiety, Anemia.  Clinical Impression  Pt admitted with/for SOB.  Workup has shown heart valve dysfunction.  Pt presently needing min guard/supervision for safety due to confusion and inability to make safe decisions.  Pt currently limited functionally due to the problems listed below.  (see problems list.)  Pt will benefit from PT to maximize function and safety to be able to get home safely with available assist.     Follow Up Recommendations Home health PT;Supervision/Assistance - 24 hour    Equipment Recommendations  None recommended by PT    Recommendations for Other Services       Precautions / Restrictions Precautions Precautions: Fall      Mobility  Bed Mobility               General bed mobility comments: sitting EOB with nursing staff working on leads on arrival  Transfers Overall transfer level: Needs assistance Equipment used: None Transfers: Sit to/from Stand Sit to Stand: Supervision         General transfer comment: guard for safety  Ambulation/Gait Ambulation/Gait assistance: Min guard Gait Distance (Feet): 200 Feet(with standing rest break at about 100 feet.) Assistive device: None Gait Pattern/deviations: Step-through pattern Gait velocity: decreased   General Gait Details: mild episodes of unsteadiness due to loss of focus.  No LOB.  Stairs            Wheelchair Mobility    Modified Rankin (Stroke Patients Only)       Balance Overall balance assessment: Needs assistance   Sitting balance-Leahy Scale: Good       Standing balance-Leahy Scale: Fair                               Pertinent Vitals/Pain Pain Assessment: No/denies pain     Home Living Family/patient expects to be discharged to:: Private residence Living Arrangements: Children Available Help at Discharge: Family;Available 24 hours/day Type of Home: House Home Access: Ramped entrance;Stairs to enter Entrance Stairs-Rails: None Entrance Stairs-Number of Steps: 5 Home Layout: One level Home Equipment: Walker - 2 wheels;Cane - quad;Wheelchair - manual      Prior Function Level of Independence: Needs assistance   Gait / Transfers Assistance Needed: no AD needed  ADL's / Homemaking Assistance Needed: pt reports she does her own meds, but per chart review this is starting to become a concern with complaince and safety. Suspect safety concerns for other higher level IADL        Hand Dominance   Dominant Hand: Right    Extremity/Trunk Assessment   Upper Extremity Assessment Upper Extremity Assessment: Defer to OT evaluation    Lower Extremity Assessment Lower Extremity Assessment: Overall WFL for tasks assessed(mild proximal weaknesses)       Communication   Communication: No difficulties  Cognition Arousal/Alertness: Awake/alert Behavior During Therapy: WFL for tasks assessed/performed Overall Cognitive Status: History of cognitive impairments - at baseline                                 General Comments: pt not oriented to situation, place, time.      General Comments General  comments (skin integrity, edema, etc.): Sats were maintained in the mid 90's, EHR in the low to mid 100's bpm.  Pt asymptomatic overall    Exercises     Assessment/Plan    PT Assessment Patient needs continued PT services  PT Problem List Decreased strength;Decreased activity tolerance;Decreased balance;Decreased mobility       PT Treatment Interventions DME instruction;Gait training;Functional mobility training;Therapeutic activities;Therapeutic exercise;Balance training;Cognitive remediation;Patient/family education    PT Goals (Current  goals can be found in the Care Plan section)  Acute Rehab PT Goals Patient Stated Goal: to go home and see family PT Goal Formulation: With patient Time For Goal Achievement: 12/23/18 Potential to Achieve Goals: Good    Frequency Min 3X/week   Barriers to discharge        Co-evaluation               AM-PAC PT "6 Clicks" Mobility  Outcome Measure Help needed turning from your back to your side while in a flat bed without using bedrails?: A Little Help needed moving from lying on your back to sitting on the side of a flat bed without using bedrails?: A Little Help needed moving to and from a bed to a chair (including a wheelchair)?: A Little Help needed standing up from a chair using your arms (e.g., wheelchair or bedside chair)?: A Little Help needed to walk in hospital room?: A Little Help needed climbing 3-5 steps with a railing? : A Little 6 Click Score: 18    End of Session   Activity Tolerance: Patient tolerated treatment well Patient left: Other (comment);with nursing/sitter in room(up with nursing) Nurse Communication: Mobility status PT Visit Diagnosis: Unsteadiness on feet (R26.81);Other abnormalities of gait and mobility (R26.89)    Time: 9937-1696 PT Time Calculation (min) (ACUTE ONLY): 17 min   Charges:   PT Evaluation $PT Eval Moderate Complexity: 1 Mod          12/29/2018  Donnella Sham, PT Acute Rehabilitation Services [phone]  (pager) [phone]  (office)  Tessie Fass Cathren Sween 12/29/2018, 5:28 PM

## 2018-12-29 NOTE — Progress Notes (Signed)
    [facility] has been requested to perform a transesophageal echocardiogram on 9/11 for mitral regurgitation.  After careful review of history and examination, the risks and benefits of transesophageal echocardiogram have been explained including risks of esophageal damage, perforation (1:10,000 risk), bleeding, pharyngeal hematoma as well as other potential complications associated with conscious sedation including aspiration, arrhythmia, respiratory failure and death. Alternatives to treatment were discussed, questions were answered.  The patient has dementia and is not able to give permission for procedures.  I contacted her daughter, Hannah [name], by phone.  I explained the results of the heart catheterization and then went over the TEE.  The risks and benefits were explained as above.  I then got the nurse on the phone in a three-way call.  Ms. Hannah [name] gave permission for [place] to do a transesophageal echocardiogram tomorrow.  This was witnessed by myself and the nurse, [place], on 3 E.  Rosaria Ferries, PA-C 12/29/2018 9:01 PM

## 2018-12-29 NOTE — Patient Outreach (Signed)
[facility]) Care Management  12/29/2018  Hannah [name] 1946/01/29 [id number]  Referral date:12/06/18 Referral source:utilization management Referral reason:Frequent ED visits Insurance:[employer] care  Update received from Natividad Brood, [place] hospital liaison that patient was admitted to the hospital for heart failure.   PLAN; Hospital liaison will follow up on patients discharge disposition and needs.     Quinn Plowman RN,BSN,CCM [facility] Telephonic  [phone]

## 2018-12-30 ENCOUNTER — Encounter (HOSPITAL_COMMUNITY)
Admission: EM | Disposition: A | Discharge status: Home Health | Payer: Self-pay | Source: Home / Self Care | Attending: Family Medicine

## 2018-12-30 ENCOUNTER — Encounter: Payer: Self-pay | Admitting: Family Medicine

## 2018-12-30 ENCOUNTER — Other Ambulatory Visit: Payer: Self-pay

## 2018-12-30 ENCOUNTER — Inpatient Hospital Stay (HOSPITAL_COMMUNITY): Payer: Medicare Other

## 2018-12-30 DIAGNOSIS — I340 Nonrheumatic mitral (valve) insufficiency: Secondary | ICD-10-CM

## 2018-12-30 DIAGNOSIS — R701 Abnormal plasma viscosity: Secondary | ICD-10-CM | POA: Insufficient documentation

## 2018-12-30 HISTORY — PX: TEE WITHOUT CARDIOVERSION: SHX5443

## 2018-12-30 HISTORY — DX: Abnormal plasma viscosity: R70.1

## 2018-12-30 LAB — CBC
HCT: 30.4 % — ABNORMAL LOW (ref 36.0–46.0)
Hemoglobin: 9.4 g/dL — ABNORMAL LOW (ref 12.0–15.0)
MCH: 28.5 pg (ref 26.0–34.0)
MCHC: 30.9 g/dL (ref 30.0–36.0)
MCV: 92.1 fL (ref 80.0–100.0)
Platelets: 251 10*3/uL (ref 150–400)
RBC: 3.3 MIL/uL — ABNORMAL LOW (ref 3.87–5.11)
RDW: 18.3 % — ABNORMAL HIGH (ref 11.5–15.5)
WBC: 8 10*3/uL (ref 4.0–10.5)
nRBC: 0 % (ref 0.0–0.2)

## 2018-12-30 LAB — BASIC METABOLIC PANEL
Anion gap: 12 (ref 5–15)
BUN: 37 mg/dL — ABNORMAL HIGH (ref 8–23)
CO2: 26 mmol/L (ref 22–32)
Calcium: 8.9 mg/dL (ref 8.9–10.3)
Chloride: 103 mmol/L (ref 98–111)
Creatinine, Ser: 1.57 mg/dL — ABNORMAL HIGH (ref 0.44–1.00)
GFR calc Af Amer: 37 mL/min — ABNORMAL LOW (ref >60)
GFR calc non Af Amer: 32 mL/min — ABNORMAL LOW (ref >60)
Glucose, Bld: 114 mg/dL — ABNORMAL HIGH (ref 70–99)
Potassium: 3.6 mmol/L (ref 3.5–5.1)
Sodium: 141 mmol/L (ref 135–145)

## 2018-12-30 LAB — HEMOGLOBIN AND HEMATOCRIT, BLOOD
HCT: 29.8 % — ABNORMAL LOW (ref 36.0–46.0)
Hemoglobin: 9.2 g/dL — ABNORMAL LOW (ref 12.0–15.0)

## 2018-12-30 SURGERY — ECHOCARDIOGRAM, TRANSESOPHAGEAL
Anesthesia: Moderate Sedation

## 2018-12-30 MED ORDER — MIDAZOLAM HCL (PF) 5 MG/ML IJ SOLN
INTRAMUSCULAR | Status: AC
  Filled 2018-12-30: qty 2

## 2018-12-30 MED ORDER — SODIUM CHLORIDE 0.9 % IV SOLN
INTRAVENOUS | Status: DC
  Administered 2018-12-30: 09:00:00 via INTRAVENOUS

## 2018-12-30 MED ORDER — FENTANYL CITRATE (PF) 100 MCG/2ML IJ SOLN
INTRAMUSCULAR | Status: AC
  Filled 2018-12-30: qty 2

## 2018-12-30 MED ORDER — POTASSIUM CHLORIDE CRYS ER 20 MEQ PO TBCR
40.0000 meq | EXTENDED_RELEASE_TABLET | Freq: Three times a day (TID) | ORAL | Status: AC
  Administered 2018-12-30: 40 meq via ORAL
  Filled 2018-12-30: qty 2

## 2018-12-30 MED ORDER — BUTAMBEN-TETRACAINE-BENZOCAINE 2-2-14 % EX AERO
INHALATION_SPRAY | CUTANEOUS | Status: DC | PRN
  Administered 2018-12-30: 2 via TOPICAL

## 2018-12-30 MED ORDER — DIPHENHYDRAMINE HCL 50 MG/ML IJ SOLN
INTRAMUSCULAR | Status: AC
  Filled 2018-12-30: qty 1

## 2018-12-30 MED ORDER — RAMELTEON 8 MG PO TABS
8.0000 mg | ORAL_TABLET | Freq: Every day | ORAL | Status: DC
  Administered 2018-12-30 – 2018-12-31 (×2): 8 mg via ORAL
  Filled 2018-12-30 (×3): qty 1

## 2018-12-30 MED ORDER — HEPARIN SODIUM (PORCINE) 5000 UNIT/ML IJ SOLN
5000.0000 [IU] | Freq: Three times a day (TID) | INTRAMUSCULAR | Status: DC
  Administered 2018-12-31 – 2019-01-01 (×4): 5000 [IU] via SUBCUTANEOUS
  Filled 2018-12-30 (×4): qty 1

## 2018-12-30 MED ORDER — MIDAZOLAM HCL (PF) 10 MG/2ML IJ SOLN
INTRAMUSCULAR | Status: DC | PRN
  Administered 2018-12-30 (×2): 2 mg via INTRAVENOUS

## 2018-12-30 MED ORDER — FENTANYL CITRATE (PF) 100 MCG/2ML IJ SOLN
INTRAMUSCULAR | Status: DC | PRN
  Administered 2018-12-30 (×2): 25 ug via INTRAVENOUS

## 2018-12-30 MED FILL — Midazolam HCl Inj 2 MG/2ML (Base Equivalent): INTRAMUSCULAR | Qty: 2 | Status: AC

## 2018-12-30 NOTE — Progress Notes (Signed)
    Transesophageal Echocardiogram Note  Hannah [name] [id number] 05/12/1945  Procedure: Transesophageal Echocardiogram Indications: MR  Procedure Details Consent: Obtained Time Out: Verified patient identification, verified procedure, site/side was marked, verified correct patient position, special equipment/implants available, Radiology Safety Procedures followed,  medications/allergies/relevent history reviewed, required imaging and test results available.  Performed  Medications:  During this procedure the patient is administered a total of Versed 4 mg and Fentanyl 50 mcg  to achieve and maintain moderate conscious sedation.  The patient's heart rate, blood pressure, and oxygen saturation are monitored continuously during the procedure. The period of conscious sedation is 30 minutes, of which I was present face-to-face 100% of this time.  Low normal LV function; restricted posterior MV leaflet with severe MR; severe LAE; no LAA thrombus.   Complications: No apparent complications Patient did tolerate procedure well.  Kirk Ruths, MD

## 2018-12-30 NOTE — Discharge Summary (Addendum)
I have reviewed the below documentation. I have seen the patient on the day of discharge and agree with the below exam.  In addition to below, I recommend the following at follow up: Additional hospital course is as follows evaluated by cardiology.  The patient underwent a cardiac catheterization which demonstrated concern for severe mitral regurgitation.  This was confirmed by transesophageal echocardiogram.  She was evaluated by the valvular heart team who recommended outpatient evaluation for possible mitral clip.  Dr. Fredrich Birks will arrange follow-up for this.  Recommend repeat metabolic panel at follow-up closely monitor fluid status.   Metoprolol should be continued.  This was corrected by her primary care physician after follow-up.   [name], MD  [facility] Discharge Summary  [name] Medical record number: [id number] Date of birth: February 20, 1946 Age: 73 y.o. Gender: female Date of Admission: 12/28/2018  Date of Discharge: 01/01/2019 Admitting Physician: Blane Ohara McDiarmid, MD  Primary Care Provider: [name], MD Consultants: Cardiology  Indication for Hospitalization: Acute diastolic heart failure, acute pulmonary edema, acute respiratory failure with hypercapnia, elevated troponin  Discharge Diagnoses/Problem List:  Heart failure with preserved ejection fraction Severe mitral valve regurgitation Acute kidney injury on CKD stage IIIb Macrocytic anemia, vitamin B12 deficiency Iron deficiency anemia Hypertension HLD Anxiety Dementia  Disposition: To home  Discharge Condition: Stable and improved  Discharge Exam:   Brief Hospital Course:  Patient presented to the hospital with worsening shortness of breath, increasing troponin, flash pulmonary edema.  Right and left heart cath was performed which revealed severe mitral regurgitation (3-4+), EF of 50 to 55%, and  pulmonary hypertension.  A TEE was performed and confirmed the mitral valve regurgitation.  Patient received Lasix 80 mg IV, had a total urine output of 4.5L, and reached her dry weight of 72.9 kg.  She was transitioned to an increased home dose of 40 mg of Lasix p.o. twice daily before discharge.  Of note patient had several sundowning episodes at night and became extremely anxious and more disoriented.  Issues for Follow Up:  1. Please ensure follow up with cardiology for management of severe mitral regurgitation  2. Lasix increased to 40mg  BID - Recommend BMP at follow up to monitor electrolytes  Significant Procedures: R/L heart cath, TEE, echo  Significant Labs and Imaging:  Recent Labs  Lab 12/29/18 0501  12/30/18 0003 12/30/18 0416 12/31/18 0644  WBC 6.2  --   --  8.0 6.6  HGB 7.1*   < > 9.2* 9.4* 9.5*  HCT 24.5*   < > 29.8* 30.4* 31.3*  PLT 260  --   --  251 256   < > = values in this interval not displayed.   Recent Labs  Lab 12/28/18 0220  12/28/18 0507 12/29/18 0501 12/29/18 1153 12/29/18 1200 12/30/18 0416 12/31/18 0644 01/01/19 0527  NA 135   < >  --  139 141 142  142 141 140 139  K 3.9   < >  --  4.0 4.1 4.1  4.2 3.6 3.7 3.5  CL 103  --   --  106  --   --  103 100 100  CO2 22  --   --  23  --   --  26 26 26   GLUCOSE 306*  --   --  113*  --   --  114* 117* 118*  BUN 26*  --   --  34*  --   --  37* 42* 44*  CREATININE 1.69*  --   --  1.59*  --   --  1.57* 1.59* 1.68*  CALCIUM 8.4*  --   --  8.4*  --   --  8.9 8.9 8.8*  MG  --   --  1.7  --   --   --   --   --   --    < > = values in this interval not displayed.     Results/Tests Pending at Time of Discharge: none  Discharge Medications:  Allergies as of 01/01/2019   No Known Allergies     Medication List    STOP taking these medications   metoprolol tartrate 25 MG tablet Commonly known as: LOPRESSOR   sertraline 100 MG tablet Commonly known as: ZOLOFT     TAKE these medications    acetaminophen 325 MG tablet Commonly known as: TYLENOL Take 650 mg by mouth every 6 (six) hours as needed for mild pain or headache.   Adult One Daily Gummies Chew Chew 1 tablet by mouth daily.   atorvastatin 40 MG tablet Commonly known as: LIPITOR Take 1 tablet (40 mg total) by mouth daily.   busPIRone 5 MG tablet Commonly known as: BUSPAR Take 5 mg by mouth 2 (two) times daily as needed (anxiety attacks).   cyanocobalamin 1000 MCG tablet Take 1 tablet (1,000 mcg total) by mouth daily. What changed:   medication strength  how much to take   furosemide 40 MG tablet Commonly known as: LASIX Take 1 tablet (40 mg total) by mouth 2 (two) times daily. What changed: when to take this   omeprazole 20 MG capsule Commonly known as: PRILOSEC TAKE 1 CAPSULE BY MOUTH DAILY AS NEEDED What changed:   how much to take  how to take this  when to take this  additional instructions   traZODone 50 MG tablet Commonly known as: DESYREL Take 0.5 tablets (25 mg total) by mouth at bedtime as needed for sleep. What changed: how much to take       Discharge Instructions: Please refer to Patient Instructions section of EMR for full details.  Patient was counseled important signs and symptoms that should prompt return to medical care, changes in medications, dietary instructions, activity restrictions, and follow up appointments.   Follow-Up Appointments: Follow-up Information    [name], MD. Go on 01/12/2019.   Specialty: Family Medicine Why: at 11:10am for follow up Contact information: [place] [address] [address] Follow up.   Specialty: [facility] Contact information: [address] [place] [address] [phone]           [name], MD 01/03/2019, 1:51 AM PGY-1, [place]

## 2018-12-30 NOTE — Care Management Important Message (Signed)
Important Message  Patient Details  Name: Hannah [name] MRN: [id number] Date of Birth: Apr 05, 1946   Medicare Important Message Given:  Yes     [name] 12/30/2018, 12:20 PM

## 2018-12-30 NOTE — Progress Notes (Addendum)
Progress Note  [name] Date of Encounter: 12/30/2018  Primary Cardiologist: Jenkins Rouge, MD   Subjective   Feeling well. No chest pain or dyspnea. Still has TR band without hematoma.   Inpatient Medications    Scheduled Meds: . sodium chloride   Intravenous Once  . atorvastatin  40 mg Oral Daily  . ferrous sulfate  325 mg Oral Q breakfast  . furosemide  20 mg Intravenous Once  . furosemide  40 mg Intravenous BID  . metoprolol tartrate  12.5 mg Oral BID  . nystatin cream   Topical BID  . pantoprazole  40 mg Oral Daily  . sodium chloride flush  3 mL Intravenous Q12H  . sodium chloride flush  3 mL Intravenous Q12H  . vitamin B-12  1,000 mcg Oral Daily   Continuous Infusions: . sodium chloride    . sodium chloride 20 mL/hr at 12/30/18 0844   PRN Meds: sodium chloride, acetaminophen **OR** acetaminophen, busPIRone, polyethylene glycol, sodium chloride flush, traZODone   Vital Signs    Vitals:   12/30/18 0031 12/30/18 0137 12/30/18 0423 12/30/18 0828  BP: (!) 121/50  (!) 123/93 (!) 116/99  Pulse: 95  (!) 101 87  Resp: 17  18 18   Temp: 97.6 F (36.4 C)  98.5 F (36.9 C) 98.2 F (36.8 C)  TempSrc: Oral  Oral Oral  SpO2:   96% 94%  Weight:  75 kg    Height:        Intake/Output Summary (Last 24 hours) at 12/30/2018 0917 Last data filed at 12/30/2018 0424 Gross per 24 hour  Intake 1631.13 ml  Output 1800 ml  Net -168.87 ml   Last 3 Weights 12/30/2018 12/29/2018 12/29/2018  Weight (lbs) 165 lb 5.5 oz 168 lb 1.6 oz 168 lb 14 oz  Weight (kg) 75 kg 76.25 kg 76.6 kg      Telemetry    SR at controlled rate Personally Reviewed  ECG    No new tracing   Physical Exam   GEN: Obese female in no acute distress.   Neck: No JVD Cardiac: RRR, + murmurs, rubs, or gallops. TR band in place with ACE wrap Respiratory: Clear to auscultation bilaterally. GI: Soft, nontender, non-distended  MS: No edema; No deformity. Neuro:  Nonfocal, answers  questions appropriately  Psych: Normal affect   Labs    High Sensitivity Troponin:   Recent Labs  Lab 12/09/18 0716 12/09/18 0944 12/28/18 0220 12/28/18 0507 12/28/18 1827  TROPONINIHS 14 14 25* 48* 30*      Chemistry Recent Labs  Lab 12/28/18 0220  12/29/18 0501 12/29/18 1153 12/29/18 1200 12/30/18 0416  NA 135   < > 139 141 142  142 141  K 3.9   < > 4.0 4.1 4.1  4.2 3.6  CL 103  --  106  --   --  103  CO2 22  --  23  --   --  26  GLUCOSE 306*  --  113*  --   --  114*  BUN 26*  --  34*  --   --  37*  CREATININE 1.69*  --  1.59*  --   --  1.57*  CALCIUM 8.4*  --  8.4*  --   --  8.9  GFRNONAA 29*  --  31*  --   --  32*  GFRAA 34*  --  36*  --   --  37*  ANIONGAP 10  --  10  --   --  12   < > = values in this interval not displayed.     Hematology Recent Labs  Lab 12/28/18 0220  12/28/18 1827 12/29/18 0501  12/29/18 1200 12/30/18 0003 12/30/18 0416  WBC 16.3*  --   --  6.2  --   --   --  8.0  RBC 2.99*  --  2.60* 2.55*  --   --   --  3.30*  HGB 8.3*   < >  --  7.1*   < > 8.5*  8.5* 9.2* 9.4*  HCT 30.5*   < >  --  24.5*   < > 25.0*  25.0* 29.8* 30.4*  MCV 102.0*  --   --  96.1  --   --   --  92.1  MCH 27.8  --   --  27.8  --   --   --  28.5  MCHC 27.2*  --   --  29.0*  --   --   --  30.9  RDW 19.1*  --   --  18.9*  --   --   --  18.3*  PLT 387  --   --  260  --   --   --  251   < > = values in this interval not displayed.    BNP Recent Labs  Lab 12/28/18 0220  BNP 713.9*     DDimer No results for input(s): DDIMER in the last 168 hours.   Radiology    No results found.  Cardiac Studies   RIGHT/LEFT HEART CATH AND CORONARY ANGIOGRAPHY 12/29/2018  Conclusion    There is severe (3-4+) mitral regurgitation. Angiographically 3+, but based on MR related V wave would be 4+.  The left ventricular systolic function is normal. The left ventricular ejection fraction is 50-55% by visual estimate.  Hemodynamic findings consistent with moderate  pulmonary hypertension and mitral valve regurgitation & LV end diastolic pressure is moderately elevated.  Angiographically normal coronary arteries   SUMMARY  Likely SEVERE MITRAL REGURGITATION with large V wave on PCWP waveform and PA waveform with at least 3-4+ MR on LV gram.  Moderate Secondary Pulmonary Hypertension related to elevated LVEDP and MR.  Angiographically normal coronary arteries  RECOMMENDATIONS  Patient likely requires additional diuresis, especially in light of planned PRBC  Closely monitor hemoglobin and renal function (minimal contrast use)  Would consider now TEE as part of formal evaluation for MR if there is consideration for possible Mitral Clip     Echo 10/31/2018 IMPRESSIONS    1. The left ventricle has normal systolic function with an ejection fraction of 60-65%. The cavity size was normal. Left ventricular diastolic Doppler parameters are consistent with pseudonormalization.  2. The right ventricle has normal systolic function. The cavity was normal. There is no increase in right ventricular wall thickness.  3. Left atrial size was moderately dilated.  4. Mild thickening of the mitral valve leaflet. Mitral valve regurgitation is mild to moderate by color flow Doppler.  5. MR not seen well on TTE. Appears mild to moderate. Consider TEE if more information needed.  6. The tricuspid valve is grossly normal.  7. The aortic valve is tricuspid. Mild calcification of the aortic valve.  8. The inferior vena cava was dilated in size with >50% respiratory variability.  9. The interatrial septum was not well visualized.  Patient Profile     Hannah [name] is a 73 y.o. female with a hx of chronic dCHF, HTN, HLD, h/o  GIB requiring transfusion, chronic iron deficiency anemia, asthma, GERD, anxiety and dementia, who is being seen for the evaluation of a/c dCHF, at the request of Dr. McDiarmid, Internal Medicine.   Assessment & Plan    1. Acute on chronic  diastolic CHF - BNP elevated at 713, significantly higher than previous levels.  Baseline BNP in the 200 range.  Chest x-ray also shows cardiomegaly and pulmonary edema. - moderately elevated LVEDP, likely due to MR - Diuresed 1.8L since admit. Weight down 3 lb (168>>165lb) - Continue lasix and BB - Scr stable at 1.5  2. Severe mitral regurgitation  - Echo 10/2018 with mild to moderate MR.  - Cath showed likely SEVERE MITRAL REGURGITATION with large V wave on PCWP waveform and PA waveform with at least 3-4+ MR on LV gram. Moderate Secondary Pulmonary Hypertension related to elevated LVEDP and MR. - TEE today for further evaluation  3. Anemia - Hgb improved post transfusion - Per primary team   4. Acute on chronic CKD III - Scr now stable at 1.5  5. HTN - BP stable on current medications       For questions or updates, please contact [place] Please consult [email or web address] for contact info under        Signed, Leanor Kail, PA  12/30/2018, 9:17 AM    Patient seen prior to TEE Discussed with Dr Stanford Breed to make measurements for Mitral Clip in MR severe She is improved with diuresis and Hb improved and stable with transfusion. Exam with clearer lungs SEM in aortic area MR difficult to hear   Jenkins Rouge

## 2018-12-30 NOTE — Progress Notes (Signed)
  Echocardiogram Echocardiogram Transesophageal has been performed.  Hannah [name] 12/30/2018, 3:23 PM

## 2018-12-30 NOTE — Progress Notes (Signed)
Progress Note  [name] Date of Encounter: 12/30/2018  Primary Cardiologist: Jenkins Rouge, MD   Subjective   Feeling well. No chest pain or dyspnea. Still has TR band without hematoma.   Inpatient Medications    Scheduled Meds: . sodium chloride   Intravenous Once  . atorvastatin  40 mg Oral Daily  . ferrous sulfate  325 mg Oral Q breakfast  . furosemide  20 mg Intravenous Once  . furosemide  40 mg Intravenous BID  . metoprolol tartrate  12.5 mg Oral BID  . nystatin cream   Topical BID  . pantoprazole  40 mg Oral Daily  . sodium chloride flush  3 mL Intravenous Q12H  . sodium chloride flush  3 mL Intravenous Q12H  . vitamin B-12  1,000 mcg Oral Daily   Continuous Infusions: . sodium chloride    . sodium chloride 20 mL/hr at 12/30/18 0844   PRN Meds: sodium chloride, acetaminophen **OR** acetaminophen, busPIRone, polyethylene glycol, sodium chloride flush, traZODone   Vital Signs    Vitals:   12/30/18 0031 12/30/18 0137 12/30/18 0423 12/30/18 0828  BP: (!) 121/50  (!) 123/93 (!) 116/99  Pulse: 95  (!) 101 87  Resp: 17  18 18   Temp: 97.6 F (36.4 C)  98.5 F (36.9 C) 98.2 F (36.8 C)  TempSrc: Oral  Oral Oral  SpO2:   96% 94%  Weight:  75 kg    Height:        Intake/Output Summary (Last 24 hours) at 12/30/2018 0944 Last data filed at 12/30/2018 0424 Gross per 24 hour  Intake 1631.13 ml  Output 1800 ml  Net -168.87 ml   Last 3 Weights 12/30/2018 12/29/2018 12/29/2018  Weight (lbs) 165 lb 5.5 oz 168 lb 1.6 oz 168 lb 14 oz  Weight (kg) 75 kg 76.25 kg 76.6 kg      Telemetry    SR at controlled rate Personally Reviewed  ECG    No new tracing   Physical Exam   GEN: Obese female in no acute distress.   Neck: No JVD Cardiac: RRR, + murmurs, rubs, or gallops. TR band in place with ACE wrap Respiratory: Clear to auscultation bilaterally. GI: Soft, nontender, non-distended  MS: No edema; No deformity. Neuro:  Nonfocal, answers  questions appropriately  Psych: Normal affect   Labs    High Sensitivity Troponin:   Recent Labs  Lab 12/09/18 0716 12/09/18 0944 12/28/18 0220 12/28/18 0507 12/28/18 1827  TROPONINIHS 14 14 25* 48* 30*      Chemistry Recent Labs  Lab 12/28/18 0220  12/29/18 0501 12/29/18 1153 12/29/18 1200 12/30/18 0416  NA 135   < > 139 141 142  142 141  K 3.9   < > 4.0 4.1 4.1  4.2 3.6  CL 103  --  106  --   --  103  CO2 22  --  23  --   --  26  GLUCOSE 306*  --  113*  --   --  114*  BUN 26*  --  34*  --   --  37*  CREATININE 1.69*  --  1.59*  --   --  1.57*  CALCIUM 8.4*  --  8.4*  --   --  8.9  GFRNONAA 29*  --  31*  --   --  32*  GFRAA 34*  --  36*  --   --  37*  ANIONGAP 10  --  10  --   --  12   < > = values in this interval not displayed.     Hematology Recent Labs  Lab 12/28/18 0220  12/28/18 1827 12/29/18 0501  12/29/18 1200 12/30/18 0003 12/30/18 0416  WBC 16.3*  --   --  6.2  --   --   --  8.0  RBC 2.99*  --  2.60* 2.55*  --   --   --  3.30*  HGB 8.3*   < >  --  7.1*   < > 8.5*  8.5* 9.2* 9.4*  HCT 30.5*   < >  --  24.5*   < > 25.0*  25.0* 29.8* 30.4*  MCV 102.0*  --   --  96.1  --   --   --  92.1  MCH 27.8  --   --  27.8  --   --   --  28.5  MCHC 27.2*  --   --  29.0*  --   --   --  30.9  RDW 19.1*  --   --  18.9*  --   --   --  18.3*  PLT 387  --   --  260  --   --   --  251   < > = values in this interval not displayed.    BNP Recent Labs  Lab 12/28/18 0220  BNP 713.9*     DDimer No results for input(s): DDIMER in the last 168 hours.   Radiology    No results found.  Cardiac Studies   RIGHT/LEFT HEART CATH AND CORONARY ANGIOGRAPHY 12/29/2018  Conclusion    There is severe (3-4+) mitral regurgitation. Angiographically 3+, but based on MR related V wave would be 4+.  The left ventricular systolic function is normal. The left ventricular ejection fraction is 50-55% by visual estimate.  Hemodynamic findings consistent with moderate  pulmonary hypertension and mitral valve regurgitation & LV end diastolic pressure is moderately elevated.  Angiographically normal coronary arteries   SUMMARY  Likely SEVERE MITRAL REGURGITATION with large V wave on PCWP waveform and PA waveform with at least 3-4+ MR on LV gram.  Moderate Secondary Pulmonary Hypertension related to elevated LVEDP and MR.  Angiographically normal coronary arteries  RECOMMENDATIONS  Patient likely requires additional diuresis, especially in light of planned PRBC  Closely monitor hemoglobin and renal function (minimal contrast use)  Would consider now TEE as part of formal evaluation for MR if there is consideration for possible Mitral Clip     Echo 10/31/2018 IMPRESSIONS    1. The left ventricle has normal systolic function with an ejection fraction of 60-65%. The cavity size was normal. Left ventricular diastolic Doppler parameters are consistent with pseudonormalization.  2. The right ventricle has normal systolic function. The cavity was normal. There is no increase in right ventricular wall thickness.  3. Left atrial size was moderately dilated.  4. Mild thickening of the mitral valve leaflet. Mitral valve regurgitation is mild to moderate by color flow Doppler.  5. MR not seen well on TTE. Appears mild to moderate. Consider TEE if more information needed.  6. The tricuspid valve is grossly normal.  7. The aortic valve is tricuspid. Mild calcification of the aortic valve.  8. The inferior vena cava was dilated in size with >50% respiratory variability.  9. The interatrial septum was not well visualized.  Patient Profile     Hannah [name] is a 73 y.o. female with a hx of chronic dCHF, HTN, HLD, h/o  GIB requiring transfusion, chronic iron deficiency anemia, asthma, GERD, anxiety and dementia, who is being seen for the evaluation of a/c dCHF, at the request of Dr. McDiarmid, Internal Medicine.   Assessment & Plan    1. Acute on chronic  diastolic CHF - BNP elevated at 713, significantly higher than previous levels.  Baseline BNP in the 200 range.  Chest x-ray also shows cardiomegaly and pulmonary edema. - moderately elevated LVEDP, likely due to MR - Diuresed 1.8L since admit. Weight down 3 lb (168>>165lb) - Continue lasix and BB - Scr stable at 1.5  2. Severe mitral regurgitation  - Echo 10/2018 with mild to moderate MR.  - Cath showed likely SEVERE MITRAL REGURGITATION with large V wave on PCWP waveform and PA waveform with at least 3-4+ MR on LV gram. Moderate Secondary Pulmonary Hypertension related to elevated LVEDP and MR. - TEE today for further evaluation  3. Anemia - Hgb improved post transfusion - Per primary team   4. Acute on chronic CKD III - Scr now stable at 1.5  5. HTN - BP stable on current medications       For questions or updates, please contact [place] Please consult [email or web address] for contact info under        Signed, Jenkins Rouge, MD  12/30/2018, 9:44 AM    Patient examined chart reviewed. LUE wrapped Murmur more of AV sclerosis apex hard to hear lungs improved only basilar rales. Discussed TEE with Dr Stanford Breed today to make measurements for MV clip if MR appears severe Review of her cath suggested grade 3/4 MR and right heart pressures supported this with large V wave No CAD Would have Dr Burt Knack see today if she appears to be mitral clip candidate Continue diuresis Hb improved post 1 unit PRBC;s yesterday  Jenkins Rouge

## 2018-12-30 NOTE — Interval H&P Note (Signed)
History and Physical Interval Note:  12/30/2018 2:57 PM  Hannah [name]  has presented today for surgery, with the diagnosis of SEVERE MITRAL REGURGITATION.  The various methods of treatment have been discussed with the patient and family. After consideration of risks, benefits and other options for treatment, the patient has consented to  Procedure(s): TRANSESOPHAGEAL ECHOCARDIOGRAM (TEE) (N/A) as a surgical intervention.  The patient's history has been reviewed, patient examined, no change in status, stable for surgery.  I have reviewed the patient's chart and labs.  Questions were answered to the patient's satisfaction.     Kirk Ruths

## 2018-12-30 NOTE — TOC Initial Note (Signed)
Transition of Care [facility]) - Initial/Assessment Note    Patient Details  Name: Hannah [name] MRN: [id number] Date of Birth: 1946/04/09  [facility] of [facility]) CM/SW Contact:    Alberteen Sam, [place] Phone Number: [phone] 12/30/2018, 9:37 AM  Clinical Narrative:                  CSW consulted with [name] regarding [facility] and OT recommendation at time of discharge. [name] reports patient was previously set up with [facility], however she states they had difficulties keeping their appointments and she was not satisfied with their service and would like to explore other home health agency options.   CSW has spoke with Tiffany at [facility] who reports they are able to accept patient for home health PT and OT. [name] made aware and provided Tiffany's direct number for any questions or concerns.    Expected Discharge Plan: [place] Barriers to Discharge: Continued Medical Work up   Patient Goals and CMS Choice Patient states their goals for this hospitalization and ongoing recovery are:: to go home CMS Medicare.gov Compare Post Acute Care list provided to:: Patient Represenative (must comment)(Jennifer (daughter)) Choice offered to / list presented to : Adult Children(Jennifer (daughter))  Expected Discharge Plan and Services Expected Discharge Plan: [place] Choice: [place] arrangements for the past 2 months: Single Family Home                           HH Arranged: OT, PT [place] Agency: [facility] [employer]) Date [place]: 12/30/18 Time [place]: [phone] Representative spoke with at [place]: [place] Arrangements/Services Living arrangements for the past 2 months: [place] Lives with:: Self Patient language and need for interpreter reviewed:: Yes Do you feel safe going back to  the place where you live?: Yes      Need for Family Participation in Patient Care: Yes (Comment) Care giver support system in place?: Yes (comment) Current home services: Home PT, Home OT Criminal Activity/Legal Involvement Pertinent to Current Situation/Hospitalization: No - Comment as needed  Activities of Daily Living      Permission Sought/Granted Permission sought to share information with : Case Manager, Customer service manager, Family Supports Permission granted to share information with : Yes, Verbal Permission Granted  Share Information with NAME: [name]  Permission granted to share info w AGENCY: [place] granted to share info w Relationship: daughter  Permission granted to share info w Contact Information: [name]  Emotional Assessment Appearance:: Appears stated age Attitude/Demeanor/Rapport: Unable to Assess Affect (typically observed): Unable to Assess Orientation: : Oriented to Self Alcohol / Substance Use: Not Applicable Psych Involvement: No (comment)  Admission diagnosis:  Acute on chronic diastolic heart failure (HCC) [I50.33] Renal insufficiency [N28.9] Macrocytic anemia [D53.9] Patient Active Problem List   Diagnosis Date Noted  . 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 Left bundle branch block (LBBB) determined by electrocardiography 12/29/2018  . Mitral valve insufficiency   . Elevated troponin 12/28/2018  . Acute respiratory failure with hypercapnia ([place]) 12/28/2018  . Vitamin B12 deficiency 12/28/2018  . Acute on chronic diastolic heart failure ([place])   . AKI (acute kidney injury) ([place])   . Gastric bleed   . Macrocytic anemia 10/27/2018  . 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 Gout 09/11/2015  . History of iron deficiency anemia 05/13/2015  . OBESITY 03/26/2010  . Essential hypertension 08/09/2008  . GERD 08/09/2008   PCP:  [name], MD Pharmacy:   [id number] -  [place], [address] [place] [address] Phone: [phone] Fax: [phone]     Social Determinants of Health (SDOH) Interventions    Readmission Risk Interventions No flowsheet data found.

## 2018-12-30 NOTE — Progress Notes (Signed)
Occupational Therapy Treatment Patient Details Name: Hannah [name] MRN: [id number] DOB: [birth year] Today's Date: 12/30/2018    History of present illness Hannah [name] is a 73 y.o. female presenting with SOB. PMH is significant for Dementia, HTN, HLD, GERD, HFpEF, OA, Anxiety, Anemia.   OT comments  Pt. Seen for skilled OT.  Able to complete shower stall transfer with S.  Verbalized how she organizes her am and pm pills in container with each day listed. States her dtr. Lives with her and "checks behind her" and makes sure all medications are organized properly.  Reports strong family support at home and is eager for d/c home when able. Note TEE scheduled for later today.    Follow Up Recommendations  Home health OT;Supervision/Assistance - 24 hour    Equipment Recommendations  None recommended by OT    Recommendations for Other Services      Precautions / Restrictions Precautions Precautions: Fall Restrictions Weight Bearing Restrictions: No       Mobility Bed Mobility               General bed mobility comments: pt. was seated on the toilet upon arrival into room  Transfers Overall transfer level: Needs assistance Equipment used: None Transfers: Sit to/from Stand;Stand Pivot Transfers Sit to Stand: Supervision Stand pivot transfers: Supervision       General transfer comment: guard for safety    Balance                                           ADL either performed or assessed with clinical judgement   ADL Overall ADL's : Needs assistance/impaired     Grooming: Wash/dry Clinical research associate: Supervision/safety;Shower seat   Functional mobility during ADLs: Supervision/safety       Vision       Perception     Praxis      Cognition Arousal/Alertness: Awake/alert Behavior During Therapy: WFL for tasks assessed/performed Overall Cognitive Status: History of cognitive  impairments - at baseline                                 General Comments: pt not oriented to situation, place, time.        Exercises     Shoulder Instructions       General Comments  making comments about having issues discussing end of life needs and wants with her dtr. (Zanylah Hardie). states she tries to talk to her and worries her dtr. Can not accept that "she wont be here one day".  Says when she tries to talk her dtr. she "acts like shell live forever".  States she does not want her dtr. Un prepared for the loss but also wants to have the conversation to make sure her dtr. Knows her requests and wishes.  Suggested trying to have the conversation again and also maybe writing a letter with her feelings and requests to allow her dtr. To process the information privately since it is a difficult subject for her. Pt. Was appreciative.  She states "im not dying tomorrow or anything but its a conversation that needs to be had".      Pertinent  Vitals/ Pain          Home Living                                          Prior Functioning/Environment              Frequency  Min 2X/week        Progress Toward Goals  OT Goals(current goals can now be found in the care plan section)  Progress towards OT goals: Progressing toward goals     Plan      Co-evaluation                 AM-PAC OT "6 Clicks" Daily Activity     Outcome Measure   Help from another person eating meals?: None Help from another person taking care of personal grooming?: A Little Help from another person toileting, which includes using toliet, bedpan, or urinal?: A Little Help from another person bathing (including washing, rinsing, drying)?: A Little Help from another person to put on and taking off regular upper body clothing?: A Little Help from another person to put on and taking off regular lower body clothing?: None 6 Click Score: 20    End of Session    OT  Visit Diagnosis: Unsteadiness on feet (R26.81);Muscle weakness (generalized) (M62.81)   Activity Tolerance Patient tolerated treatment well   Patient Left in chair;with call bell/phone within reach;with chair alarm set   Nurse Communication          Time: [phone] OT Time Calculation (min): 23 min  Charges: OT General Charges $OT Visit: 1 Visit OT Treatments $Self Care/Home Management : 23-37 mins    Janice Coffin, COTA/L 12/30/2018, 10:32 AM

## 2018-12-30 NOTE — H&P (View-Only) (Signed)
Progress Note  [name] Date of Encounter: 12/30/2018  Primary Cardiologist: Jenkins Rouge, MD   Subjective   Feeling well. No chest pain or dyspnea. Still has TR band without hematoma.   Inpatient Medications    Scheduled Meds: . sodium chloride   Intravenous Once  . atorvastatin  40 mg Oral Daily  . ferrous sulfate  325 mg Oral Q breakfast  . furosemide  20 mg Intravenous Once  . furosemide  40 mg Intravenous BID  . metoprolol tartrate  12.5 mg Oral BID  . nystatin cream   Topical BID  . pantoprazole  40 mg Oral Daily  . sodium chloride flush  3 mL Intravenous Q12H  . sodium chloride flush  3 mL Intravenous Q12H  . vitamin B-12  1,000 mcg Oral Daily   Continuous Infusions: . sodium chloride    . sodium chloride 20 mL/hr at 12/30/18 0844   PRN Meds: sodium chloride, acetaminophen **OR** acetaminophen, busPIRone, polyethylene glycol, sodium chloride flush, traZODone   Vital Signs    Vitals:   12/30/18 0031 12/30/18 0137 12/30/18 0423 12/30/18 0828  BP: (!) 121/50  (!) 123/93 (!) 116/99  Pulse: 95  (!) 101 87  Resp: 17  18 18   Temp: 97.6 F (36.4 C)  98.5 F (36.9 C) 98.2 F (36.8 C)  TempSrc: Oral  Oral Oral  SpO2:   96% 94%  Weight:  75 kg    Height:        Intake/Output Summary (Last 24 hours) at 12/30/2018 0944 Last data filed at 12/30/2018 0424 Gross per 24 hour  Intake 1631.13 ml  Output 1800 ml  Net -168.87 ml   Last 3 Weights 12/30/2018 12/29/2018 12/29/2018  Weight (lbs) 165 lb 5.5 oz 168 lb 1.6 oz 168 lb 14 oz  Weight (kg) 75 kg 76.25 kg 76.6 kg      Telemetry    SR at controlled rate Personally Reviewed  ECG    No new tracing   Physical Exam   GEN: Obese female in no acute distress.   Neck: No JVD Cardiac: RRR, + murmurs, rubs, or gallops. TR band in place with ACE wrap Respiratory: Clear to auscultation bilaterally. GI: Soft, nontender, non-distended  MS: No edema; No deformity. Neuro:  Nonfocal, answers  questions appropriately  Psych: Normal affect   Labs    High Sensitivity Troponin:   Recent Labs  Lab 12/09/18 0716 12/09/18 0944 12/28/18 0220 12/28/18 0507 12/28/18 1827  TROPONINIHS 14 14 25* 48* 30*      Chemistry Recent Labs  Lab 12/28/18 0220  12/29/18 0501 12/29/18 1153 12/29/18 1200 12/30/18 0416  NA 135   < > 139 141 142  142 141  K 3.9   < > 4.0 4.1 4.1  4.2 3.6  CL 103  --  106  --   --  103  CO2 22  --  23  --   --  26  GLUCOSE 306*  --  113*  --   --  114*  BUN 26*  --  34*  --   --  37*  CREATININE 1.69*  --  1.59*  --   --  1.57*  CALCIUM 8.4*  --  8.4*  --   --  8.9  GFRNONAA 29*  --  31*  --   --  32*  GFRAA 34*  --  36*  --   --  37*  ANIONGAP 10  --  10  --   --  12   < > = values in this interval not displayed.     Hematology Recent Labs  Lab 12/28/18 0220  12/28/18 1827 12/29/18 0501  12/29/18 1200 12/30/18 0003 12/30/18 0416  WBC 16.3*  --   --  6.2  --   --   --  8.0  RBC 2.99*  --  2.60* 2.55*  --   --   --  3.30*  HGB 8.3*   < >  --  7.1*   < > 8.5*  8.5* 9.2* 9.4*  HCT 30.5*   < >  --  24.5*   < > 25.0*  25.0* 29.8* 30.4*  MCV 102.0*  --   --  96.1  --   --   --  92.1  MCH 27.8  --   --  27.8  --   --   --  28.5  MCHC 27.2*  --   --  29.0*  --   --   --  30.9  RDW 19.1*  --   --  18.9*  --   --   --  18.3*  PLT 387  --   --  260  --   --   --  251   < > = values in this interval not displayed.    BNP Recent Labs  Lab 12/28/18 0220  BNP 713.9*     DDimer No results for input(s): DDIMER in the last 168 hours.   Radiology    No results found.  Cardiac Studies   RIGHT/LEFT HEART CATH AND CORONARY ANGIOGRAPHY 12/29/2018  Conclusion    There is severe (3-4+) mitral regurgitation. Angiographically 3+, but based on MR related V wave would be 4+.  The left ventricular systolic function is normal. The left ventricular ejection fraction is 50-55% by visual estimate.  Hemodynamic findings consistent with moderate  pulmonary hypertension and mitral valve regurgitation & LV end diastolic pressure is moderately elevated.  Angiographically normal coronary arteries   SUMMARY  Likely SEVERE MITRAL REGURGITATION with large V wave on PCWP waveform and PA waveform with at least 3-4+ MR on LV gram.  Moderate Secondary Pulmonary Hypertension related to elevated LVEDP and MR.  Angiographically normal coronary arteries  RECOMMENDATIONS  Patient likely requires additional diuresis, especially in light of planned PRBC  Closely monitor hemoglobin and renal function (minimal contrast use)  Would consider now TEE as part of formal evaluation for MR if there is consideration for possible Mitral Clip     Echo 10/31/2018 IMPRESSIONS    1. The left ventricle has normal systolic function with an ejection fraction of 60-65%. The cavity size was normal. Left ventricular diastolic Doppler parameters are consistent with pseudonormalization.  2. The right ventricle has normal systolic function. The cavity was normal. There is no increase in right ventricular wall thickness.  3. Left atrial size was moderately dilated.  4. Mild thickening of the mitral valve leaflet. Mitral valve regurgitation is mild to moderate by color flow Doppler.  5. MR not seen well on TTE. Appears mild to moderate. Consider TEE if more information needed.  6. The tricuspid valve is grossly normal.  7. The aortic valve is tricuspid. Mild calcification of the aortic valve.  8. The inferior vena cava was dilated in size with >50% respiratory variability.  9. The interatrial septum was not well visualized.  Patient Profile     Hannah [name] is a 73 y.o. female with a hx of chronic dCHF, HTN, HLD, h/o  GIB requiring transfusion, chronic iron deficiency anemia, asthma, GERD, anxiety and dementia, who is being seen for the evaluation of a/c dCHF, at the request of Dr. McDiarmid, Internal Medicine.   Assessment & Plan    1. Acute on chronic  diastolic CHF - BNP elevated at 713, significantly higher than previous levels.  Baseline BNP in the 200 range.  Chest x-ray also shows cardiomegaly and pulmonary edema. - moderately elevated LVEDP, likely due to MR - Diuresed 1.8L since admit. Weight down 3 lb (168>>165lb) - Continue lasix and BB - Scr stable at 1.5  2. Severe mitral regurgitation  - Echo 10/2018 with mild to moderate MR.  - Cath showed likely SEVERE MITRAL REGURGITATION with large V wave on PCWP waveform and PA waveform with at least 3-4+ MR on LV gram. Moderate Secondary Pulmonary Hypertension related to elevated LVEDP and MR. - TEE today for further evaluation  3. Anemia - Hgb improved post transfusion - Per primary team   4. Acute on chronic CKD III - Scr now stable at 1.5  5. HTN - BP stable on current medications       For questions or updates, please contact [place] Please consult [email or web address] for contact info under        Signed, Jenkins Rouge, MD  12/30/2018, 9:44 AM    Patient examined chart reviewed. LUE wrapped Murmur more of AV sclerosis apex hard to hear lungs improved only basilar rales. Discussed TEE with Dr Stanford Breed today to make measurements for MV clip if MR appears severe Review of her cath suggested grade 3/4 MR and right heart pressures supported this with large V wave No CAD Would have Dr Burt Knack see today if she appears to be mitral clip candidate Continue diuresis Hb improved post 1 unit PRBC;s yesterday  Jenkins Rouge

## 2018-12-30 NOTE — Progress Notes (Signed)
FPTS Brief Note  Called by nurse that patient is removing telemetry box. Patient back from TEE. She believes she is at work, awaiting transport home. No chest pain, no palpitations. Cranial nerve exam WNL. No neurological findings.   Altered mental status, per daughter, this happens at home frequently. Likely sundowning, consider repeat labs, chest x-ray if worsens overnight. May need sitter overnight.  Called daughter to update. She is unable to come in to hospital at night.  Discussed with nurse, will give Buspar and Trazodone. Delirium precautions, ramelteon ordered.  [name], MD  Family Medicine Teaching Service

## 2018-12-30 NOTE — Progress Notes (Signed)
CSW lvm with [name] to review Home Health PT/OT recs and offer choice of agency, awaiting call back.   [place], [place]

## 2018-12-30 NOTE — Progress Notes (Signed)
Called [name]) with updates. Will continue to provide updates, awaiting Cardiology recommendations re: TEE. Appreciate their input and care.  [name], MD  Family Medicine Teaching Service

## 2018-12-30 NOTE — Progress Notes (Addendum)
Family Medicine Teaching Service Daily Progress Note Intern Pager: [phone]  [name] Medical record number: [id number] Date of birth: [birth year] Age: 73 y.o. Gender: female  Primary Care Provider: [name], MD Consultants: Cardiology Code Status: Full  Pt Overview and Major Events to Date:  9/9- admitted 9/10- R/L heart cath> severe MR  9/11- TEE  Assessment and Plan: Hannah [name] is a 73 y.o. female presenting with SOB due to acute on chronic HFpEF with flash pulmonary edema. PMH is significant for Dementia,HTN, HLD, GERD, HFpEF, OA, Anxiety, Anemia.  Acute on chronic HFpEF with flash pulmonary edema: Patient is satting 96% on RA, sitting up comfortably and reportedly feeling good. Patient received Lasix 40 mg IV x1 yesterday, UOP 1.5L, lost 1.2kg and weight today is 75 kg (77.7 kg on admission). Dry weight is difficult to assess as she has had many admissions for flash pulmonary edema this year, but weight at last discharge in August was 72.6 kg.  Flash pulmonary edema is likely due to severe MR found on R/L heart cath yesterday. To TEE today, n.p.o. in preparation. -Admit to telemetry, attending Dr. McDiarmid -Continuous pulse ox - AVOID IVF FOR FLASH PULMONARY EDEMA -Furosemide 40 mg IV BID -continue home metoprolol -Daily weights, strict I's/O's -Cardiology following, appreciate recommendations -TEE today -PT/OT to eval and treat  Macrocytic Anemia: Hemoglobin improved to 9.4 today after 1U pRBCs.  Hemoglobin 8.3 on admission with MCV 110, hgb dropped to 7.1 yesterday. Previously requiring transfusions during admissions due to GI blood loss. Transfusion threshold >7. Vit B12 decreased to 173. Anemia labs: Fe 21, TIBC 393, Ferritin 55. RBC 2.6 and reticulocyte absolute count 103.7. - avoid antiplatelet and anticoagulation therapy 2/2 h/o GIB and anemia to 7.1 on 9/10 -Daily CBC -Monitor for any signs of bleeding  -Increased Vit B12 to  1000mg   AKI on CKD stage IIIb: Cr elevated at 1.69 improving today to 1.57, previously has been ~1.0, but has been elevated during recent hospitalizations. Will avoid starting IV fluids this patient has concern for volume overload. UOP appropriate with 1.5L.  Potassium is 3.6 today, will give 2 doses of K-Dur. - avoid IVF for flash pulmonary edema -Trend BMP / urinary output -Replace electrolytes as indicated > 2x K-DUR -Avoid nephrotoxic agents, ensure adequate renal perfusion  Leukocytosis-resolved WBC elevated to 16.3 on admission down to 6.2 today, likely secondary to epinephrine.  No signs of infection and patient afebrile. -Continue to monitor CBC -Monitor for any signs of infection  Hypertension: BP 148/91 on admission, normotensive today 123/93. Patient on home metoprolol 12.5 mg twice daily -Continue home metoprolol  HLD: Patient on home atorvastatin 40 mg -Atorvastatin 40 mg  Anxiety: Meds include Zoloft 50 mg daily, BuSpar 5 mg, trazodone -Continue buspirone 5 mg, trazodone 25 mg at bedtime  -Patient refusing zoloft,   Dementia: As noted above, patient has had multiple visits to the ED and hospital for perceived SOB that seems to resolve. Patient does not have memory of any of these episodes. [name] has previously stated that her mother's healthcare was beyond her ability to manage, but was concerned about placing her mother in a facility due to feelings of guilt and COVID19.  -PT/OT eval and treat -Please consult daughter [name] for any consent to treat and medication changes  FEN/GI:  n.p.o. for TEE @ 2:30 > please make diet Heart healthy/carb modified after procedure Prophylaxis: SCDs, no anticoagulation 2/2 anemia and h/o GIB and hemoglobin 7.1 on 9/10, can consider  heparin over weekend if hgb stable  Disposition: Telemetry  Subjective: Feels much better today, sitting up, talking in complete sentences, on room air and stable.  Objective: Temp:  [97.6  F (36.4 C)-98.8 F (37.1 C)] 98.5 F (36.9 C) (09/11 0423) Pulse Rate:  [80-101] 101 (09/11 0423) Resp:  [16-22] 18 (09/11 0423) BP: (100-140)/(48-110) 123/93 (09/11 0423) SpO2:  [90 %-100 %] 96 % (09/11 0423) Weight:  [75 kg] 75 kg (09/11 0137) Physical Exam: General: Well-appearing, pleasant, no acute distress Cardiovascular: Regular rate and rhythm, no rub, gallop Respiratory: Clear to auscultation bilaterally, appropriate airflow in bilateral bases today, normal work of breathing on room air Abdomen: Soft, nontender, nondistended Extremities: Moves all 4 extremities equally, no lower extremity edema Psych: Good mood, AO x2, short-term memory affected by dementia, appropriate affect  Laboratory: Recent Labs  Lab 12/28/18 0220  12/29/18 0501  12/29/18 1200 12/30/18 0003 12/30/18 0416  WBC 16.3*  --  6.2  --   --   --  8.0  HGB 8.3*   < > 7.1*   < > 8.5*  8.5* 9.2* 9.4*  HCT 30.5*   < > 24.5*   < > 25.0*  25.0* 29.8* 30.4*  PLT 387  --  260  --   --   --  251   < > = values in this interval not displayed.   Recent Labs  Lab 12/28/18 0220  12/29/18 0501 12/29/18 1153 12/29/18 1200 12/30/18 0416  NA 135   < > 139 141 142  142 141  K 3.9   < > 4.0 4.1 4.1  4.2 3.6  CL 103  --  106  --   --  103  CO2 22  --  23  --   --  26  BUN 26*  --  34*  --   --  37*  CREATININE 1.69*  --  1.59*  --   --  1.57*  CALCIUM 8.4*  --  8.4*  --   --  8.9  GLUCOSE 306*  --  113*  --   --  114*   < > = values in this interval not displayed.    Imaging/Diagnostic Tests: No results found.   [name], MD 12/30/2018, 8:17 AM PGY-1 [place] Intern pager: [phone] text pages welcome

## 2018-12-30 NOTE — Plan of Care (Signed)

## 2018-12-31 DIAGNOSIS — R410 Disorientation, unspecified: Secondary | ICD-10-CM

## 2018-12-31 LAB — CBC
HCT: 31.3 % — ABNORMAL LOW (ref 36.0–46.0)
Hemoglobin: 9.5 g/dL — ABNORMAL LOW (ref 12.0–15.0)
MCH: 28.5 pg (ref 26.0–34.0)
MCHC: 30.4 g/dL (ref 30.0–36.0)
MCV: 94 fL (ref 80.0–100.0)
Platelets: 256 10*3/uL (ref 150–400)
RBC: 3.33 MIL/uL — ABNORMAL LOW (ref 3.87–5.11)
RDW: 17.9 % — ABNORMAL HIGH (ref 11.5–15.5)
WBC: 6.6 10*3/uL (ref 4.0–10.5)
nRBC: 0 % (ref 0.0–0.2)

## 2018-12-31 LAB — BASIC METABOLIC PANEL
Anion gap: 14 (ref 5–15)
BUN: 42 mg/dL — ABNORMAL HIGH (ref 8–23)
CO2: 26 mmol/L (ref 22–32)
Calcium: 8.9 mg/dL (ref 8.9–10.3)
Chloride: 100 mmol/L (ref 98–111)
Creatinine, Ser: 1.59 mg/dL — ABNORMAL HIGH (ref 0.44–1.00)
GFR calc Af Amer: 36 mL/min — ABNORMAL LOW (ref >60)
GFR calc non Af Amer: 31 mL/min — ABNORMAL LOW (ref >60)
Glucose, Bld: 117 mg/dL — ABNORMAL HIGH (ref 70–99)
Potassium: 3.7 mmol/L (ref 3.5–5.1)
Sodium: 140 mmol/L (ref 135–145)

## 2018-12-31 MED ORDER — POTASSIUM CHLORIDE CRYS ER 20 MEQ PO TBCR
20.0000 meq | EXTENDED_RELEASE_TABLET | Freq: Once | ORAL | Status: AC
  Administered 2018-12-31: 20 meq via ORAL
  Filled 2018-12-31: qty 1

## 2018-12-31 MED ORDER — FUROSEMIDE 40 MG PO TABS
40.0000 mg | ORAL_TABLET | Freq: Two times a day (BID) | ORAL | Status: DC
  Administered 2018-12-31 – 2019-01-01 (×2): 40 mg via ORAL
  Filled 2018-12-31 (×2): qty 1

## 2018-12-31 MED ORDER — FUROSEMIDE 40 MG PO TABS: 40.0000 mg | ORAL_TABLET | Freq: Every day | ORAL | Status: DC

## 2018-12-31 MED ORDER — POTASSIUM CHLORIDE CRYS ER 20 MEQ PO TBCR: 40.0000 meq | EXTENDED_RELEASE_TABLET | Freq: Once | ORAL | Status: DC

## 2018-12-31 NOTE — Progress Notes (Signed)
Patient is pleasantly confused and will not stay in her room. She has been redirected and is sitting in the hallway with me.

## 2018-12-31 NOTE — Progress Notes (Signed)
Cardiology Progress Note  Patient ID: Hannah [name] MRN: [id number] DOB: 1945/05/11 Date of Encounter: 12/31/2018  Primary Cardiologist: Jenkins Rouge, MD  Subjective  Doing well today, reports no symptoms of chest pain or trouble breathing.  Transesophageal echocardiogram showed severe mitral vegetation like due to degenerative valve disease yesterday.  ROS:  All other ROS reviewed and negative. Pertinent positives noted in the HPI.     Inpatient Medications  Scheduled Meds: . sodium chloride   Intravenous Once  . atorvastatin  40 mg Oral Daily  . ferrous sulfate  325 mg Oral Q breakfast  . furosemide  40 mg Oral BID  . heparin  5,000 Units Subcutaneous Q8H  . metoprolol tartrate  12.5 mg Oral BID  . nystatin cream   Topical BID  . pantoprazole  40 mg Oral Daily  . potassium chloride  20 mEq Oral Once  . ramelteon  8 mg Oral QHS  . sodium chloride flush  3 mL Intravenous Q12H  . sodium chloride flush  3 mL Intravenous Q12H  . vitamin B-12  1,000 mcg Oral Daily   Continuous Infusions: . sodium chloride     PRN Meds: sodium chloride, acetaminophen **OR** acetaminophen, busPIRone, polyethylene glycol, sodium chloride flush, traZODone   Vital Signs   Vitals:   12/30/18 1956 12/31/18 0607 12/31/18 0830 12/31/18 1201  BP: 121/65 (!) 113/58 114/90 110/64  Pulse: 89 83 93 84  Resp: 18 18  20   Temp: 98 F (36.7 C) 98 F (36.7 C)  98.5 F (36.9 C)  TempSrc: Oral Oral  Oral  SpO2: 96% 95%  97%  Weight:  72.9 kg    Height:        Intake/Output Summary (Last 24 hours) at 12/31/2018 1226 Last data filed at 12/31/2018 1025 Gross per 24 hour  Intake 794.41 ml  Output 1200 ml  Net -405.59 ml   Last 3 Weights 12/31/2018 12/30/2018 12/30/2018  Weight (lbs) 160 lb 12.8 oz 165 lb 5.5 oz 165 lb 5.5 oz  Weight (kg) 72.938 kg 75 kg 75 kg      Telemetry  Overnight telemetry shows normal sinus rhythm heart rate 70-80, which I personally reviewed.   ECG   Physical Exam    Vitals:   12/30/18 1956 12/31/18 0607 12/31/18 0830 12/31/18 1201  BP: 121/65 (!) 113/58 114/90 110/64  Pulse: 89 83 93 84  Resp: 18 18  20   Temp: 98 F (36.7 C) 98 F (36.7 C)  98.5 F (36.9 C)  TempSrc: Oral Oral  Oral  SpO2: 96% 95%  97%  Weight:  72.9 kg    Height:         Intake/Output Summary (Last 24 hours) at 12/31/2018 1226 Last data filed at 12/31/2018 1025 Gross per 24 hour  Intake 794.41 ml  Output 1200 ml  Net -405.59 ml    Last 3 Weights 12/31/2018 12/30/2018 12/30/2018  Weight (lbs) 160 lb 12.8 oz 165 lb 5.5 oz 165 lb 5.5 oz  Weight (kg) 72.938 kg 75 kg 75 kg    Body mass index is 29.41 kg/m.   General: Well nourished, well developed, in no acute distress Heart: Atraumatic, normal size  Eyes: PEERLA, EOMI  Neck: Supple, no JVD Endocrine: No thryomegaly Cardiac: 3/6 holosystolic murmur radiates into the axilla Lungs: Crackles at lung bases Abd: Soft, nontender, no hepatomegaly  Ext: Trace edema, pulses 2+ Musculoskeletal: No deformities, BUE and BLE strength normal and equal Skin: Warm and dry, no rashes   Neuro:  Alert and oriented to person, place, time, and situation, CNII-XII grossly intact, no focal deficits  Psych: Normal mood and affect   Labs  High Sensitivity Troponin:   Recent Labs  Lab 12/09/18 0716 12/09/18 0944 12/28/18 0220 12/28/18 0507 12/28/18 1827  TROPONINIHS 14 14 25* 48* 30*     Cardiac EnzymesNo results for input(s): TROPONINI in the last 168 hours. No results for input(s): TROPIPOC in the last 168 hours.  Chemistry Recent Labs  Lab 12/29/18 0501  12/29/18 1200 12/30/18 0416 12/31/18 0644  NA 139   < > 142  142 141 140  K 4.0   < > 4.1  4.2 3.6 3.7  CL 106  --   --  103 100  CO2 23  --   --  26 26  GLUCOSE 113*  --   --  114* 117*  BUN 34*  --   --  37* 42*  CREATININE 1.59*  --   --  1.57* 1.59*  CALCIUM 8.4*  --   --  8.9 8.9  GFRNONAA 31*  --   --  32* 31*  GFRAA 36*  --   --  37* 36*  ANIONGAP 10  --   --  12  14   < > = values in this interval not displayed.    Hematology Recent Labs  Lab 12/29/18 0501  12/30/18 0003 12/30/18 0416 12/31/18 0644  WBC 6.2  --   --  8.0 6.6  RBC 2.55*  --   --  3.30* 3.33*  HGB 7.1*   < > 9.2* 9.4* 9.5*  HCT 24.5*   < > 29.8* 30.4* 31.3*  MCV 96.1  --   --  92.1 94.0  MCH 27.8  --   --  28.5 28.5  MCHC 29.0*  --   --  30.9 30.4  RDW 18.9*  --   --  18.3* 17.9*  PLT 260  --   --  251 256   < > = values in this interval not displayed.   BNP Recent Labs  Lab 12/28/18 0220  BNP 713.9*    DDimer No results for input(s): DDIMER in the last 168 hours.   Radiology  No results found.  Cardiac Studies  TEE 12/29/2018  1. The left ventricle has low normal systolic function, with an ejection fraction of 50-55%. No evidence of left ventricular regional wall motion abnormalities.  2. The right ventricle has normal systolc function. The cavity was normal.  3. Left atrial size was severely dilated.  4. No evidence of a thrombus present in the left atrial appendage.  5. Trivial pericardial effusion is present.  6. The mitral valve is abnormal. Mild thickening of the mitral valve leaflet. Mitral valve regurgitation is severe by color flow Doppler.  7. The aortic valve is tricuspid No stenosis of the aortic valve.  8. There is evidence of moderate plaque in the descending aorta.  9. Low normal LV systolic function; posterior MV leaflet appears to be restricted with severe MR; severe LAE.  Patient Profile  Hannah [name] is a 73 y.o. female with history of hypertension, hyperlipidemia, dementia admitted with acute heart failure secondary to severe mitral vegetation.  Assessment & Plan  1.  Symptomatic, severe mitral vegetation with ejection fraction 55 to 60% -TEE yesterday shows restricted leaflet motion of the posterior mitral valve leaflet and the P2 to P3 commissural segments.  Leaflet motion is restricted in systole consistent with Carpentier IIIb  pathology.  -  She likely will end up being a candidate for mitral valve clipping.  We will arrange follow-up for her to see our structural heart disease specialist for evaluation for this.  She is already had a cardiac catheterization showing normal coronary arteries. -Interim would continue her home metoprolol as well as Lasix 40 mg twice daily -If she remains euvolemic after today's trial of oral diuretics I think she can go home tomorrow with follow-up as an outpatient.  We will arrange all of her outpatient appointments.   For questions or updates, please contact [place] Please consult [email or web address] for contact info under        Signed, [place] T. Audie Box, [place]  12/31/2018 12:26 PM

## 2018-12-31 NOTE — Progress Notes (Signed)
Patient confused. Insists on going home to her house across the street. PRN meds for anxiety and sleep given to patient to help calm her down. Patient is currently standing in hallway stating "I am going home to my house".Will continue safety rounds on patient.

## 2018-12-31 NOTE — Progress Notes (Signed)
Patient is confused and is getting out of bed saying she has to go home. I found her in the hallway wandering. She is redirected and put back to bed with her bed alarm on.

## 2018-12-31 NOTE — Progress Notes (Addendum)
FMTS Attending Daily Note: [name], MD  Team Pager [phone] Pager [phone]  I have seen and examined this patient, reviewed their chart. I have discussed this patient with the resident. I agree with the resident's findings, assessment and care plan.  Edited plan below. Will be sure to update Hannah [name], patient's daughter. [facility] consultation.    Family Medicine Teaching Service Daily Progress Note Intern Pager: [phone]  [name] Medical record number: [id number] Date of birth: 1945-10-24 Age: 73 y.o. Gender: female  Primary Care Provider: [name], MD Consultants: Cardiology Code Status: Full  Pt Overview and Major Events to Date:  9/9- admitted 9/10- R/L heart cath> severe MR  9/11- TEE> severe MR  Assessment and Plan: Hannah [name] is a 73 y.o. female presenting with SOB due to acute on chronic HFpEF with flash pulmonary edema. PMH is significant for Dementia,HTN, HLD, GERD, HFpEF, OA, Anxiety, Anemia.  Acute on chronic HFpEF with flash pulmonary edema: Patient is satting well on RA, sitting up comfortably and reportedly feeling good. Patient received Lasix 40 mg IV yesterday, UOP 1L, lost 2.1kg and has likely reached dry weight today of 72.9 kg (77.7 kg on admission). Switching to home dose of PO lasix. Flash pulmonary edema was likely due to severe MR found on R/L heart cath, confirmed on TEE. - switch to Furosemide 40 mg PO BID -continue home metoprolol -Daily weights, strict I's/O's -Cardiology following, appreciate recommendations, will plan for follow up as outpatient in structural heart disease clinic  -PT/OT to eval and treat  Macrocytic Anemia: Hemoglobin stable at 9.5 today, s/p 1U pRBCs on 9/10.  Transfusion threshold >7. Vit B12 decreased to 173. Anemia labs: Fe 21, TIBC 393, Ferritin 55. RBC 2.6 and reticulocyte absolute count 103.7. - on heparin -Daily CBC -Monitor for any signs of bleeding  -Increased  Vit B12 to 1000mg    AKI on CKD stage IIIb: Cr elevated at 1.69 at admission, now stable around 1.57-1.59, may be new baseline. Previously has been ~1.0.  UOP appropriate with 1L.  Potassium is 3.7 today, will give 1 doses of K-Dur 20 mEq. -Trend BMP / urinary output -Replace electrolytes as indicated > 1x K-DUR -Avoid nephrotoxic agents, ensure adequate renal perfusion  Leukocytosis-resolved WBC elevated to 16.3 on admission down to 6.2 today, likely secondary to epinephrine.  No signs of infection and patient afebrile. -Continue to monitor CBC -Monitor for any signs of infection  Hypertension: BP 148/91 on admission, normotensive today 113/58. Patient on home metoprolol 12.5 mg twice daily -Continue home metoprolol  HLD: Patient on home atorvastatin 40 mg -Atorvastatin 40 mg  Anxiety: Meds include Zoloft 50 mg daily, BuSpar 5 mg, trazodone -Continue buspirone 5 mg, trazodone 25 mg at bedtime  -Patient refusing zoloft  Dementia: Patient becomes more agitated and has worse memory problems in the evening, likely due to sundowning.  This likely explains the recent visits to the ED she has had at night for waking up with anxiety. -PT/OT eval and treat -Please consult daughter Hannah [name] for any consent to treat and medication changes  FEN/GI:  heart healthy/carb modified Prophylaxis: heparin  Disposition: Home on 9/13 pending stability on diuretic   Subjective: Feels much better today, sitting up, talking in complete sentences, on room air and stable.  Euvolemic volume status.  Objective: Temp:  [97.7 F (36.5 C)-98.5 F (36.9 C)] 98 F (36.7 C) (09/12 0607) Pulse Rate:  [82-94] 93 (09/12 0830) Resp:  [16-27] 18 (09/12 0607) BP: (88-144)/(22-90)  114/90 (09/12 0830) SpO2:  [93 %-100 %] 95 % (09/12 0607) Weight:  [72.9 kg-75 kg] 72.9 kg (09/12 0607) Physical Exam: General: Well-appearing, pleasant, no acute distress Cardiovascular: Regular rate and rhythm, no rub,  gallop Respiratory: Clear to auscultation bilaterally, appropriate airflow in bilateral bases today, normal work of breathing on room air Abdomen: Soft, nontender, nondistended Extremities: Moves all 4 extremities equally, no lower extremity edema Psych: Good mood, AO x1, short-term memory affected by dementia, appropriate affect  Laboratory: Recent Labs  Lab 12/29/18 0501  12/30/18 0003 12/30/18 0416 12/31/18 0644  WBC 6.2  --   --  8.0 6.6  HGB 7.1*   < > 9.2* 9.4* 9.5*  HCT 24.5*   < > 29.8* 30.4* 31.3*  PLT 260  --   --  251 256   < > = values in this interval not displayed.   Recent Labs  Lab 12/29/18 0501  12/29/18 1200 12/30/18 0416 12/31/18 0644  NA 139   < > 142  142 141 140  K 4.0   < > 4.1  4.2 3.6 3.7  CL 106  --   --  103 100  CO2 23  --   --  26 26  BUN 34*  --   --  37* 42*  CREATININE 1.59*  --   --  1.57* 1.59*  CALCIUM 8.4*  --   --  8.9 8.9  GLUCOSE 113*  --   --  114* 117*   < > = values in this interval not displayed.    Imaging/Diagnostic Tests: No results found.   Hannah Damme, MD 12/31/2018, 11:15 AM PGY-1 [place] Intern pager: [phone] text pages welcome

## 2019-01-01 ENCOUNTER — Encounter (HOSPITAL_COMMUNITY): Payer: Self-pay | Admitting: Cardiology

## 2019-01-01 ENCOUNTER — Other Ambulatory Visit: Payer: Self-pay | Admitting: Family Medicine

## 2019-01-01 LAB — BASIC METABOLIC PANEL
Anion gap: 13 (ref 5–15)
BUN: 44 mg/dL — ABNORMAL HIGH (ref 8–23)
CO2: 26 mmol/L (ref 22–32)
Calcium: 8.8 mg/dL — ABNORMAL LOW (ref 8.9–10.3)
Chloride: 100 mmol/L (ref 98–111)
Creatinine, Ser: 1.68 mg/dL — ABNORMAL HIGH (ref 0.44–1.00)
GFR calc Af Amer: 34 mL/min — ABNORMAL LOW (ref >60)
GFR calc non Af Amer: 29 mL/min — ABNORMAL LOW (ref >60)
Glucose, Bld: 118 mg/dL — ABNORMAL HIGH (ref 70–99)
Potassium: 3.5 mmol/L (ref 3.5–5.1)
Sodium: 139 mmol/L (ref 135–145)

## 2019-01-01 MED ORDER — TRAZODONE HCL 50 MG PO TABS: 25.0000 mg | ORAL_TABLET | Freq: Every evening | ORAL | 0 refills | Status: DC | PRN

## 2019-01-01 MED ORDER — METOPROLOL TARTRATE 25 MG PO TABS: 12.5000 mg | ORAL_TABLET | Freq: Two times a day (BID) | ORAL | 0 refills | Status: DC

## 2019-01-01 MED ORDER — CYANOCOBALAMIN 1000 MCG PO TABS: 1000.0000 ug | ORAL_TABLET | Freq: Every day | ORAL | 0 refills | Status: DC

## 2019-01-01 MED ORDER — FUROSEMIDE 40 MG PO TABS: 40.0000 mg | ORAL_TABLET | Freq: Two times a day (BID) | ORAL | 0 refills | Status: DC

## 2019-01-01 MED ORDER — POTASSIUM CHLORIDE CRYS ER 20 MEQ PO TBCR
40.0000 meq | EXTENDED_RELEASE_TABLET | Freq: Every day | ORAL | Status: DC
  Administered 2019-01-01: 40 meq via ORAL
  Filled 2019-01-01: qty 2

## 2019-01-01 NOTE — Discharge Instructions (Signed)
Please continue your lasix 40mg  twice a day. I recommend you weigh yourself daily and monitor your legs for swelling. If you find your weight increasing, legs swelling, or your breathing becoming more difficult I recommend you call your doctor immediately.   Please follow up with Dr. Erin Hearing on 9/24 at 11;10am for hospital follow up  Please be sure to look out for a call from the cardiologist to schedule follow up and mitral valve replacement. Tentative plan for mitral valve repair in October.    Low-Sodium Eating Plan Sodium, which is an element that makes up salt, helps you maintain a healthy balance of fluids in your body. Too much sodium can increase your blood pressure and cause fluid and waste to be held in your body. Your health care provider or dietitian may recommend following this plan if you have high blood pressure (hypertension), kidney disease, liver disease, or heart failure. Eating less sodium can help lower your blood pressure, reduce swelling, and protect your heart, liver, and kidneys. What are tips for following this plan? General guidelines  Most people on this plan should limit their sodium intake to 1,500-2,000 mg (milligrams) of sodium each day. Reading food labels   The Nutrition Facts label lists the amount of sodium in one serving of the food. If you eat more than one serving, you must multiply the listed amount of sodium by the number of servings.  Choose foods with less than 140 mg of sodium per serving.  Avoid foods with 300 mg of sodium or more per serving. Shopping  Look for lower-sodium products, often labeled as "low-sodium" or "no salt added."  Always check the sodium content even if foods are labeled as "unsalted" or "no salt added".  Buy fresh foods. ? Avoid canned foods and premade or frozen meals. ? Avoid canned, cured, or processed meats  Buy breads that have less than 80 mg of sodium per slice. Cooking  Eat more home-cooked food and less  restaurant, buffet, and fast food.  Avoid adding salt when cooking. Use salt-free seasonings or herbs instead of table salt or sea salt. Check with your health care provider or pharmacist before using salt substitutes.  Cook with plant-based oils, such as canola, sunflower, or olive oil. Meal planning  When eating at a restaurant, ask that your food be prepared with less salt or no salt, if possible.  Avoid foods that contain MSG (monosodium glutamate). MSG is sometimes added to [place] food, bouillon, and some canned foods. What foods are recommended? The items listed may not be a complete list. Talk with your dietitian about what dietary choices are best for you. Grains Low-sodium cereals, including oats, puffed wheat and rice, and shredded wheat. Low-sodium crackers. Unsalted rice. Unsalted pasta. Low-sodium bread. Whole-grain breads and whole-grain pasta. Vegetables Fresh or frozen vegetables. "No salt added" canned vegetables. "No salt added" tomato sauce and paste. Low-sodium or reduced-sodium tomato and vegetable juice. Fruits Fresh, frozen, or canned fruit. Fruit juice. Meats and other protein foods Fresh or frozen (no salt added) meat, poultry, seafood, and fish. Low-sodium canned tuna and salmon. Unsalted nuts. Dried peas, beans, and lentils without added salt. Unsalted canned beans. Eggs. Unsalted nut butters. Dairy Milk. Soy milk. Cheese that is naturally low in sodium, such as ricotta cheese, fresh mozzarella, or Swiss cheese Low-sodium or reduced-sodium cheese. Cream cheese. Yogurt. Fats and oils Unsalted butter. Unsalted margarine with no trans fat. Vegetable oils such as canola or olive oils. Seasonings and other foods Fresh and  dried herbs and spices. Salt-free seasonings. Low-sodium mustard and ketchup. Sodium-free salad dressing. Sodium-free light mayonnaise. Fresh or refrigerated horseradish. Lemon juice. Vinegar. Homemade, reduced-sodium, or low-sodium soups. Unsalted  popcorn and pretzels. Low-salt or salt-free chips. What foods are not recommended? The items listed may not be a complete list. Talk with your dietitian about what dietary choices are best for you. Grains Instant hot cereals. Bread stuffing, pancake, and biscuit mixes. Croutons. Seasoned rice or pasta mixes. Noodle soup cups. Boxed or frozen macaroni and cheese. Regular salted crackers. Self-rising flour. Vegetables Sauerkraut, pickled vegetables, and relishes. Olives. [place] fries. Onion rings. Regular canned vegetables (not low-sodium or reduced-sodium). Regular canned tomato sauce and paste (not low-sodium or reduced-sodium). Regular tomato and vegetable juice (not low-sodium or reduced-sodium). Frozen vegetables in sauces. Meats and other protein foods Meat or fish that is salted, canned, smoked, spiced, or pickled. Bacon, ham, sausage, hotdogs, corned beef, chipped beef, packaged lunch meats, salt pork, jerky, pickled herring, anchovies, regular canned tuna, sardines, salted nuts. Dairy Processed cheese and cheese spreads. Cheese curds. Blue cheese. Feta cheese. String cheese. Regular cottage cheese. Buttermilk. Canned milk. Fats and oils Salted butter. Regular margarine. Ghee. Bacon fat. Seasonings and other foods Onion salt, garlic salt, seasoned salt, table salt, and sea salt. Canned and packaged gravies. Worcestershire sauce. Tartar sauce. Barbecue sauce. Teriyaki sauce. Soy sauce, including reduced-sodium. Steak sauce. Fish sauce. Oyster sauce. Cocktail sauce. Horseradish that you find on the shelf. Regular ketchup and mustard. Meat flavorings and tenderizers. Bouillon cubes. Hot sauce and Tabasco sauce. Premade or packaged marinades. Premade or packaged taco seasonings. Relishes. Regular salad dressings. Salsa. Potato and tortilla chips. Corn chips and puffs. Salted popcorn and pretzels. Canned or dried soups. Pizza. Frozen entrees and pot pies. Summary  Eating less sodium can help lower  your blood pressure, reduce swelling, and protect your heart, liver, and kidneys.  Most people on this plan should limit their sodium intake to 1,500-2,000 mg (milligrams) of sodium each day.  Canned, boxed, and frozen foods are high in sodium. Restaurant foods, fast foods, and pizza are also very high in sodium. You also get sodium by adding salt to food.  Try to cook at home, eat more fresh fruits and vegetables, and eat less fast food, canned, processed, or prepared foods. This information is not intended to replace advice given to you by your health care provider. Make sure you discuss any questions you have with your health care provider. Document Released: 09/26/2001 Document Revised: 03/19/2017 Document Reviewed: 03/30/2016 Elsevier Patient Education  2020 [employer].

## 2019-01-01 NOTE — Plan of Care (Signed)
Patient is more alert to self and place disoriented to time. Her bed alarm is on because of her safety.

## 2019-01-01 NOTE — Care Management (Signed)
Notified [facility] that patient will DC today.

## 2019-01-01 NOTE — Consult Note (Addendum)
[facility]   MULTIDISCIPLINARY HEART VALVE TEAM  Date:  01/01/2019   ID:  Hannah [name], DOB 11/11/1945, MRN [id number]  PCP:  [name], MD   Chief Complaint  Patient presents with   Respiratory Distress     HISTORY OF PRESENT ILLNESS: Hannah [name] is a 73 y.o. female admitted with acute on chronic combined systolic and diastolic heart failure.  The patient is found to have severe mitral regurgitation and consultation is requested for evaluation of treatment options.  In review of the patient's records, she has had multiple hospitalizations and emergency room evaluations over the previous 2 months for shortness of breath and congestive heart failure.  Since October 27, 2018, she has been seen in the emergency room 11 times and has been admitted to the hospital 5 times, with at least 4 of these admissions primarily for pulmonary edema.  She most recently is admitted December 28, 2018 and has undergone right and left heart catheterization as well as transesophageal echo studies.  Her right and left heart catheterization demonstrated angiographically normal coronary arteries, low normal LV systolic function with LVEF 50 to 55%, and hemodynamic findings demonstrating moderate pulmonary hypertension and large V waves consistent with severe mitral regurgitation.  The patient's V wave is 45 mmHg.  Transesophageal echo study demonstrated severe mitral regurgitation with leaflet thickening and posterior leaflet restriction.  The patient's medical history is pertinent for chronic diastolic heart failure, gastrointestinal bleeding with history of mesenteric artery coil embolization, and at least mild dementia.  She presented this admission with acute respiratory distress, elevated BNP of 713, chest x-ray demonstrating cardiomegaly, vascular congestion, and interstitial pulmonary edema.  She quickly improved with IV diuresis.  At the time of my evaluation this morning, the  patient's daughter is at the bedside.  Her son who lives in [place] is conferenced in via Administrator.  The patient lives with another daughter and 2 of her grandchildren.  Over the past 4 years, she has had problems with her memory.  This is worsened significantly over the last 6 months coinciding with her progressive medical problems.  She is still able to function independently, but no longer drives a car.  She is able to tell me the names of all of her children (4 daughters and 1 son).  However, her recollection of symptoms and recent hospitalizations is very poor.  She has not had any chest pain, chest pressure, syncope, falls, or heart palpitations.  She has had edema in her legs which is resolved during this hospitalization.  She has had orthopnea and shortness of breath.  She still prepares food for herself and states that her other family members eat a lot of fast food.  She does not add salt to her food.  She reports compliance with her medications.  The patient has had regular dental care and reports no problems with her teeth.  Past Medical History:  Diagnosis Date   Anemia    Anxiety    Anxiety state 08/09/2008   Spoke with Daughter came up with following plan  If patient has episode of shortness of breath or chest pain or distress they will check her pulse ox  If < 85 or she seems to lose cx they will cal 911  Other wise will treat as panic attack and give buspar up to 2 caps four times a day and try to calm and redirect her.      Arthritis    "hands" (05/09/2015)  CHF (congestive heart failure) (HCC)    Elevated TSH 02/03/2018   GERD (gastroesophageal reflux disease)    High cholesterol    History of blood transfusion 1960s; 05/08/2015   "when I had my kids; low HgB"   History of iron deficiency anemia 05/13/2015   Hypertension    Left bundle branch block (LBBB) determined by electrocardiography 12/29/2018   Osteoarthritis    Pneumonia 05/2004   Renal  insufficiency 11/09/2017   Reticulocytosis 12/30/2018   SOB (shortness of breath) 05/08/2015   Vitamin B12 deficiency 12/28/2018    Current Facility-Administered Medications  Medication Dose Route Frequency Provider Last Rate Last Dose   0.9 %  sodium chloride infusion (Manually program via Guardrails IV Fluids)   Intravenous Once Lelon Perla, MD       0.9 %  sodium chloride infusion  250 mL Intravenous PRN Lelon Perla, MD       acetaminophen (TYLENOL) tablet 650 mg  650 mg Oral Q6H PRN Lelon Perla, MD       Or   acetaminophen (TYLENOL) suppository 650 mg  650 mg Rectal Q6H PRN Lelon Perla, MD       atorvastatin (LIPITOR) tablet 40 mg  40 mg Oral Daily Lelon Perla, MD   40 mg at 01/01/19 0848   busPIRone (BUSPAR) tablet 5 mg  5 mg Oral BID PRN Lelon Perla, MD   5 mg at 01/01/19 0847   ferrous sulfate tablet 325 mg  325 mg Oral Q breakfast Lelon Perla, MD   325 mg at 01/01/19 0847   furosemide (LASIX) tablet 40 mg  40 mg Oral BID [name], MD   40 mg at 01/01/19 0847   heparin injection 5,000 Units  5,000 Units Subcutaneous Q8H Mullis, Kiersten P, DO   5,000 Units at 01/01/19 0503   metoprolol tartrate (LOPRESSOR) tablet 12.5 mg  12.5 mg Oral BID Lelon Perla, MD   12.5 mg at 01/01/19 0848   nystatin cream (MYCOSTATIN)   Topical BID Lelon Perla, MD       pantoprazole (PROTONIX) EC tablet 40 mg  40 mg Oral Daily Lelon Perla, MD   40 mg at 01/01/19 0847   polyethylene glycol (MIRALAX / GLYCOLAX) packet 17 g  17 g Oral Daily PRN Lelon Perla, MD       potassium chloride SA (K-DUR) CR tablet 40 mEq  40 mEq Oral Daily Mullis, Kiersten P, DO       ramelteon (ROZEREM) tablet 8 mg  8 mg Oral QHS Martyn Malay, MD   8 mg at 12/31/18 2017   sodium chloride flush (NS) 0.9 % injection 3 mL  3 mL Intravenous Q12H Lelon Perla, MD   3 mL at 01/01/19 0852   sodium chloride flush (NS) 0.9 % injection 3 mL  3 mL  Intravenous Q12H Lelon Perla, MD   3 mL at 01/01/19 0851   sodium chloride flush (NS) 0.9 % injection 3 mL  3 mL Intravenous PRN Lelon Perla, MD       traZODone (DESYREL) tablet 25 mg  25 mg Oral QHS PRN Lelon Perla, MD   25 mg at 12/31/18 2017   vitamin B-12 (CYANOCOBALAMIN) tablet 1,000 mcg  1,000 mcg Oral Daily Lelon Perla, MD   1,000 mcg at 01/01/19 0848    ALLERGIES:   Patient has no known allergies.   SOCIAL HISTORY:  The patient  reports that  she quit smoking about 14 years ago. Her smoking use included cigarettes. She has a 2.40 pack-year smoking history. She has never used smokeless tobacco. She reports that she does not drink alcohol or use drugs.   FAMILY HISTORY:  The patient's family history includes Breast cancer in her sister; Cancer in her sister; Heart disease in her mother; Hypertension in her father and sister.   REVIEW OF SYSTEMS:  Positive for arthritis in her hands, recent gastrointestinal bleed, poor memory.   All other systems are reviewed and negative.   PHYSICAL EXAM: VS:  BP 106/83    Pulse 90    Temp 97.8 F (36.6 C) (Oral)    Resp 18    Ht 5\' 2"  (1.575 m)    Wt 73.4 kg    SpO2 96%    BMI 29.60 kg/m  , BMI Body mass index is 29.6 kg/m. GEN: Well nourished, well developed, in no acute distress HEENT: normal Neck: No JVD. carotids 2+ without bruits or masses Cardiac: The heart is RRR with a 2/6 systolic murmur at the base and a 3/6 holosystolic murmur at the apex, no edema. Pedal pulses 2+ = bilaterally  Respiratory:  clear to auscultation bilaterally GI: soft, nontender, nondistended, + BS MS: no deformity or atrophy Skin: warm and dry, no rash Neuro:  Strength and sensation are intact Psych: euthymic mood, full affect  EKG:  EKG from 12/28/2018 reviewed and demonstrates normal sinus rhythm 100 bpm, nonspecific IVCD  RECENT LABS: 12/09/2018: TSH 4.175 12/18/2018: ALT 13 12/28/2018: B Natriuretic Peptide 713.9; Magnesium  1.7 12/31/2018: Hemoglobin 9.5; Platelets 256 01/01/2019: BUN 44; Creatinine, Ser 1.68; Potassium 3.5; Sodium 139  12/09/2018: Cholesterol 127; HDL 55; LDL Cholesterol 63; Total CHOL/HDL Ratio 2.3; Triglycerides 44; VLDL 9   Estimated Creatinine Clearance: 27.1 mL/min (A) (by C-G formula based on SCr of 1.68 mg/dL (H)).   Wt Readings from Last 3 Encounters:  01/01/19 73.4 kg  12/10/18 74.3 kg  12/06/18 72.6 kg     CARDIAC STUDIES:  Echo:  IMPRESSIONS    1. The left ventricle has normal systolic function with an ejection fraction of 60-65%. The cavity size was normal. Left ventricular diastolic Doppler parameters are consistent with pseudonormalization.  2. The right ventricle has normal systolic function. The cavity was normal. There is no increase in right ventricular wall thickness.  3. Left atrial size was moderately dilated.  4. Mild thickening of the mitral valve leaflet. Mitral valve regurgitation is mild to moderate by color flow Doppler.  5. MR not seen well on TTE. Appears mild to moderate. Consider TEE if more information needed.  6. The tricuspid valve is grossly normal.  7. The aortic valve is tricuspid. Mild calcification of the aortic valve.  8. The inferior vena cava was dilated in size with >50% respiratory variability.  9. The interatrial septum was not well visualized.  FINDINGS  Left Ventricle: The left ventricle has normal systolic function, with an ejection fraction of 60-65%. The cavity size was normal. There is no increase in left ventricular wall thickness. Left ventricular diastolic Doppler parameters are consistent with  pseudonormalization.  Right Ventricle: The right ventricle has normal systolic function. The cavity was normal. There is no increase in right ventricular wall thickness.  Left Atrium: Left atrial size was moderately dilated.  Right Atrium: Right atrial size was normal in size. Right atrial pressure is estimated at 10  mmHg.  Interatrial Septum: The interatrial septum was not well visualized.  Pericardium: There is  no evidence of pericardial effusion.  Mitral Valve: The mitral valve is normal in structure. Mild thickening of the mitral valve leaflet. Mitral valve regurgitation is mild to moderate by color flow Doppler. MR not seen well on TTE. Appears mild to moderate. Consider TEE if more information  needed.  Tricuspid Valve: The tricuspid valve is grossly normal. Tricuspid valve regurgitation is trivial by color flow Doppler.  Aortic Valve: The aortic valve is tricuspid Mild calcification of the aortic valve. Aortic valve regurgitation was not visualized by color flow Doppler.  Pulmonic Valve: The pulmonic valve was grossly normal. Pulmonic valve regurgitation is not visualized by color flow Doppler.  Venous: The inferior vena cava is dilated in size with greater than 50% respiratory variability.    +--------------+--------++  LEFT VENTRICLE            +----------------+---------++ +--------------+--------++  Diastology                    PLAX 2D                   +----------------+---------++ +--------------+--------++  LV e' lateral:   8.92 cm/s    LVIDd:         4.90 cm    +----------------+---------++ +--------------+--------++  LV E/e' lateral: 16.1         LVIDs:         3.50 cm    +----------------+---------++ +--------------+--------++  LV e' medial:    9.25 cm/s    LV PW:         0.90 cm    +----------------+---------++ +--------------+--------++  LV E/e' medial:  15.6         LV IVS:        1.00 cm    +----------------+---------++ +--------------+--------++  LVOT diam:     2.00 cm    +--------------+--------++  LV SV:         62 ml      +--------------+--------++  LV SV Index:   34.25      +--------------+--------++  LVOT Area:     3.14 cm   +--------------+--------++                            +--------------+--------++  +---------------+----------++  RIGHT  VENTRICLE              +---------------+----------++  RV S prime:     14.50 cm/s   +---------------+----------++  TAPSE (M-mode): 3.4 cm       +---------------+----------++  +---------------+-------++-----------++  LEFT ATRIUM              Index         +---------------+-------++-----------++  LA diam:        4.50 cm  2.58 cm/m    +---------------+-------++-----------++  LA Vol (A2C):   51.7 ml  29.65 ml/m   +---------------+-------++-----------++  LA Vol (A4C):   52.1 ml  29.88 ml/m   +---------------+-------++-----------++  LA Biplane Vol: 53.9 ml  30.91 ml/m   +---------------+-------++-----------++ +------------+---------++----------++  RIGHT ATRIUM            Index        +------------+---------++----------++  RA Pressure: 3.00 mmHg               +------------+---------++----------++  RA Area:     8.21 cm                +------------+---------++----------++  RA Volume:   13.50 ml  7.74 ml/m   +------------+---------++----------++  +------------+-----------++  AORTIC VALVE               +------------+-----------++  LVOT Vmax:   109.00 cm/s   +------------+-----------++  LVOT Vmean:  77.700 cm/s   +------------+-----------++  LVOT VTI:    0.264 m       +------------+-----------++   +-------------+-------++  AORTA                   +-------------+-------++  Ao Root diam: 2.60 cm   +-------------+-------++  +--------------+----------++    +---------------+---------++  MITRAL VALVE                    TRICUSPID VALVE             +--------------+----------++    +---------------+---------++  MV Area (PHT): 4.15 cm         Estimated RAP:  3.00 mmHg   +--------------+----------++    +---------------+---------++  MV PHT:        53.07 msec   +--------------+----------++    +--------------+-------+  MV Decel Time: 183 msec         SHUNTS                  +--------------+----------++    +--------------+-------+ +----------------+-----------++  Systemic VTI:  0.26  m    MR Peak grad:    102.4 mmHg    +--------------+-------+ +----------------+-----------++  Systemic Diam: 2.00 cm   MR Mean grad:    65.0 mmHg     +--------------+-------+ +----------------+-----------++  MR Vmax:         506.00 cm/s   +----------------+-----------++  MR Vmean:        388.0 cm/s    +----------------+-----------++  MR PISA:         1.57 cm      +----------------+-----------++  MR PISA Eff ROA: 12 mm        +----------------+-----------++  MR PISA Radius:  0.50 cm       +----------------+-----------++ +--------------+-----------++  MV E velocity: 144.00 cm/s   +--------------+-----------++  MV A velocity: 107.00 cm/s   +--------------+-----------++  MV E/A ratio:  1.35            Cardiac Cath:  Conclusion    There is severe (3-4+) mitral regurgitation. Angiographically 3+, but based on MR related V wave would be 4+.  The left ventricular systolic function is normal. The left ventricular ejection fraction is 50-55% by visual estimate.  Hemodynamic findings consistent with moderate pulmonary hypertension and mitral valve regurgitation & LV end diastolic pressure is moderately elevated.  Angiographically normal coronary arteries   SUMMARY  Likely SEVERE MITRAL REGURGITATION with large V wave on PCWP waveform and PA waveform with at least 3-4+ MR on LV gram.  Moderate Secondary Pulmonary Hypertension related to elevated LVEDP and MR.  Angiographically normal coronary arteries  RECOMMENDATIONS  Patient likely requires additional diuresis, especially in light of planned PRBC  Closely monitor hemoglobin and renal function (minimal contrast use)  Would consider now TEE as part of formal evaluation for MR if there is consideration for possible Mitral Clip     TEE: IMPRESSIONS    1. The left ventricle has low normal systolic function, with an ejection fraction of 50-55%. No evidence of left ventricular regional wall motion abnormalities.  2.  The right ventricle has normal systolc function. The cavity was normal.  3. Left atrial size was severely dilated.  4. No evidence of a thrombus present in  the left atrial appendage.  5. Trivial pericardial effusion is present.  6. The mitral valve is abnormal. Mild thickening of the mitral valve leaflet. Mitral valve regurgitation is severe by color flow Doppler.  7. The aortic valve is tricuspid No stenosis of the aortic valve.  8. There is evidence of moderate plaque in the descending aorta.  9. Low normal LV systolic function; posterior MV leaflet appears to be restricted with severe MR; severe LAE.  FINDINGS  Left Ventricle: The left ventricle has low normal systolic function, with an ejection fraction of 50-55%. No evidence of left ventricular regional wall motion abnormalities.  Right Ventricle: The right ventricle has normal systolic function. The cavity was normal.  Left Atrium: Left atrial size was severely dilated.   Left Atrial Appendage: No evidence of a thrombus present in the left atrial appendage.  Right Atrium: Right atrial size was normal in size.  Interatrial Septum: No atrial level shunt detected by color flow Doppler.  Pericardium: Trivial pericardial effusion is present.  Mitral Valve: The mitral valve is abnormal. Mild thickening of the mitral valve leaflet. Mitral valve regurgitation is severe by color flow Doppler.  Tricuspid Valve: The tricuspid valve was normal in structure. Tricuspid valve regurgitation is trivial by color flow Doppler.  Aortic Valve: The aortic valve is tricuspid Aortic valve regurgitation was not visualized by color flow Doppler. There is No stenosis of the aortic valve.  Pulmonic Valve: The pulmonic valve was normal in structure. Pulmonic valve regurgitation is trivial by color flow Doppler.  Aorta: There is evidence of moderate plaque in the descending aorta.  Additional Findings: Low normal LV systolic function;  posterior MV leaflet appears to be restricted with severe MR; severe LAE.    +-------------+---------++  MITRAL VALVE              +-------------+---------++  MV Peak grad: 5.7 mmHg    +-------------+---------++  MV Mean grad: 3.0 mmHg    +-------------+---------++  MV Vmax:      1.19 m/s    +-------------+---------++  MV Vmean:     82.5 cm/s   +-------------+---------++  MV VTI:       0.29 m      +-------------+---------++ +----------------+-----------++  MR Peak grad:    142.1 mmHg    +----------------+-----------++  MR Mean grad:    78.0 mmHg     +----------------+-----------++  MR Vmax:         596.00 cm/s   +----------------+-----------++  MR Vmean:        402.0 cm/s    +----------------+-----------++  MR PISA:         3.08 cm      +----------------+-----------++  MR PISA Eff ROA: 20 mm        +----------------+-----------++  MR PISA Radius:  0.70 cm       +----------------+-----------++   STS RISK CALCULATOR: Mitral Valve Replacement: Risk of Mortality: 3.939% Renal Failure: 7.106% Permanent Stroke: 1.746% Prolonged Ventilation: 16.596% DSW Infection: 0.123% Reoperation: 5.020% Morbidity or Mortality: 21.789% Short Length of Stay: 14.501% Long Length of Stay: 13.576%  Mitral Valve Repair: Risk of Mortality: 1.739% Renal Failure: 3.306% Permanent Stroke: 1.490% Prolonged Ventilation: 8.867% DSW Infection: 0.064% Reoperation: 2.916% Morbidity or Mortality: 13.090% Short Length of Stay: 25.758% Long Length of Stay: 7.504%   ASSESSMENT AND PLAN: 73 year old woman with severe, stage D1 nonrheumatic mitral regurgitation associated with acute on chronic combined systolic and diastolic heart failure with multiple heart failure hospitalizations in the last 8 weeks. Structural heart  consultation is request by Dr Eleonore Chiquito for consideration of transcatheter edge-to-edge repair of the mitral valve.   I have reviewed the natural history  of mitral regurgitation with the patient and their family members who are present today. We have discussed the limitations of medical therapy and the poor prognosis associated with symptomatic mitral regurgitation. We have also reviewed potential treatment options, including palliative medical therapy, conventional surgical mitral valve repair or replacement, and percutaneous mitral valve therapies such as edge-to-edge mitral valve approximation with MitraClip. We discussed treatment options in the context of this patient's specific comorbid medical conditions.   I have personally reviewed her echo images and agree the patient does have severe mitral regurgitation with type IIIb dysfunction with posterior leaflet restriction.  The mitral valve leaflets are thickened as well.  The patient has low normal LV EF which is suggestive of LV dysfunction in the setting of severe mitral regurgitation.  Hemodynamics from cardiac catheterization confirm significant MR with a large V wave.  Mitral valve repair is indicated in this patient with continued symptoms of heart failure and recurrent episodes of acute pulmonary edema.  We discussed ongoing efforts at medical therapy, sodium restriction, daily weights, and fluid restriction.  The patient's pertinent comorbid conditions include dementia and recent gastrointestinal bleeding.  While she is reasonably independent, she appears to have at least mild to moderate dementia and I doubt that she would be a candidate for conventional cardiac surgery under any circumstances.  However, I counseled the patient about the need for a multidisciplinary heart team approach to her care.  I have recommended outpatient cardiac surgical consultation with Dr. Roxy Manns to further review potential treatment options.  If she is felt to be a nonsurgical candidate, I think percutaneous edge-to-edge mitral valve repair would be a reasonable consideration.  I will review her TEE images with our  multidisciplinary heart valve team to make sure that we agree her valve is amenable to MitraClip.  Based on my review of her images, I I think her valve anatomy is likely suitable for edge-to-edge repair.  The patient and her son and daughter are counseled specifically about the risks, indications, and alternatives to percutaneous mitral valve repair with MitraClip. Specific risks include vascular injury, bleeding, infection, arrhythmia, myocardial infarction, stroke, cardiac perforation, cardiac tamponade, device embolization, single leaflet detachment, endocarditis, mitral valve injury, emergency surgery, and death. They understand the risk of serious complication occurs at a rate of approximately 2%.  After this discussion, they understand that she will continue to undergo multidisciplinary evaluation as outlined above.  From a cardiac perspective, the patient appears stable for hospital discharge and we will arrange all of her outpatient cardiac evaluation.  Deatra James 01/01/2019 10:22 AM     [facility] [address] [place] [address]  [phone] (office) [phone] (fax)

## 2019-01-01 NOTE — Progress Notes (Signed)
Patient discharged: Home with family  Via: Wheelchair   Discharge paperwork given: to patient and family  Reviewed with teach back  IV removed  and telemetry disconnected by primary nurse   Belongings given to patient  [facility] set up confirmed by CM and Family as well

## 2019-01-01 NOTE — Progress Notes (Signed)
FMTS Attending Daily Note: [name], MD  Team Pager [phone] Pager [phone]  I have seen and examined this patient, reviewed their chart. I have discussed this patient with the resident. I agree with the resident's findings, assessment and care plan.  Will discharge on PO furosemide and Cardiology/CT surgery follow up. [facility] consultation and care.   Family Medicine Teaching Service Daily Progress Note Intern Pager: [phone]  [name] Medical record number: [id number] Date of birth: [birth year] Age: 73 y.o. Gender: female  Primary Care Provider: [name], MD Consultants: Cardiology Code Status: Full  Pt Overview and Major Events to Date:  9/9- admitted 9/10- R/L heart cath> severe MR  9/11- TEE> severe MR  Assessment and Plan: Hannah [name] is a 73 y.o. female presenting with SOB due to acute on chronic HFpEF with flash pulmonary edema. PMH is significant for Dementia,HTN, HLD, GERD, HFpEF, OA, Anxiety, Anemia.  Acute on chronic HFpEF with flash pulmonary edema likely 2/2 severe MR: Remains stable on room air, VSS. Transitioned to PO lasix 40mg  BID with 959mL UOP and 3 unmeasured voids. Weight only slightly increased this AM at 72.9>73.4 but appears to be at her dry weight. Wt down 10lbs from admission. Patient has opted for full work up of MR before [place]. Cardiology aware, likely candidate for mitral valve clipping.  - Cardiology following, appreciate recs, will follow up with plan in regards to MR treatment - continue home 40mg  PO lasix BID - continue home metoprolol - Daily weights, strict I's/O's - PT/OT to eval and treat  Severe Mitral Regurgitation:  Severe MR found on R/L heart cath and confirmed on TEE. Expect SOB and hypervolemia secondary to this. Patient has opted for full work up and treatment prior to discharge. - cardiology consulted, have recommended outpatient evaluation   Macrocytic Anemia  Vitamin  B12 Deficiency: Hemoglobin has been stable at 9. S/p 1U pRBCs on 9/10.  Transfusion threshold >7. Vit B12 found to be low. Iron Low, ferritin WNL.  - on heparin - Daily CBC - Monitor for any signs of bleeding  - Vit B12 1000mg  QD, iron supplement  AKI on CKD stage IIIb: Cr slightly worse 1.59>1.68 today, may be new baseline. Previously has been ~1.0.  UOP ~1L with 3 unmeasured voids. K 3.5 today. -Trend BMP / urinary output -Replace electrolytes as indicated -Avoid nephrotoxic agents, ensure adequate renal perfusion  Hypertension: improved BP 106/83 this AM. Home meds: Metoprolol 12.5mg  BID.  -Continue home metoprolol  HLD: Home meds: Atorvastatin 40mg  QD - continue home meds   Anxiety: Home meds include: Zoloft 50 mg daily, BuSpar 5 mg, trazodone 25mg  qHS -Continue buspirone 5 mg, trazodone 25 mg at bedtime  -Patient refusing zoloft  Dementia  Sundowning: Patient becomes more agitated and has worse memory problems in the evening, likely due to sundowning.  This likely explains the recent visits to the ED she has had at night for waking up with anxiety. -PT/OT eval and treat -Please consult daughter [name] for any consent to treat and medication changes - delirium precautions  FEN/GI:  heart healthy/carb modified, Protnix 40mg  QD Prophylaxis: heparin  Disposition: Home on 9/13 pending stability on diuretic   Subjective: Feels much better today, sitting up, talking in complete sentences, on room air and stable.  Euvolemic volume status.  Objective: Temp:  [97.6 F (36.4 C)-98.2 F (36.8 C)] 98.2 F (36.8 C) (09/13 1200) Pulse Rate:  [82-100] 89 (09/13 1200) Resp:  [18-20] 20 (09/13 1200)  BP: (106-129)/(57-83) 119/57 (09/13 1200) SpO2:  [96 %-99 %] 99 % (09/13 1200) Weight:  [73.4 kg] 73.4 kg (09/13 0500) Physical Exam:  General: Well-appearing, pleasant, no acute distress Cardiovascular: Regular rate and rhythm, systolic murmur at LLSB with radiation to  apex Lungs clear bilaterally  Extremities: Moves all 4 extremities equally, no lower extremity edema Psych: Pleasant, alert, aware she is in hospital this AM, does not remember examiner   Laboratory: Recent Labs  Lab 12/29/18 0501  12/30/18 0003 12/30/18 0416 12/31/18 0644  WBC 6.2  --   --  8.0 6.6  HGB 7.1*   < > 9.2* 9.4* 9.5*  HCT 24.5*   < > 29.8* 30.4* 31.3*  PLT 260  --   --  251 256   < > = values in this interval not displayed.   Recent Labs  Lab 12/30/18 0416 12/31/18 0644 01/01/19 0527  NA 141 140 139  K 3.6 3.7 3.5  CL 103 100 100  CO2 26 26 26   BUN 37* 42* 44*  CREATININE 1.57* 1.59* 1.68*  CALCIUM 8.9 8.9 8.8*  GLUCOSE 114* 117* 118*    [name], MD  Family Medicine Teaching Service

## 2019-01-02 LAB — TYPE AND SCREEN
ABO/RH(D): O NEG
Antibody Screen: POSITIVE
Unit division: 0
Unit division: 0
Unit division: 0

## 2019-01-02 LAB — BPAM RBC
Blood Product Expiration Date: 202009132359
Blood Product Expiration Date: 202009192359
Blood Product Expiration Date: 202009222359
ISSUE DATE / TIME: 202008272027
ISSUE DATE / TIME: 202009101747
Unit Type and Rh: 9500
Unit Type and Rh: 9500
Unit Type and Rh: 9500

## 2019-01-03 ENCOUNTER — Other Ambulatory Visit: Payer: Self-pay

## 2019-01-03 NOTE — Patient Outreach (Signed)
[facility]) Care Management  01/03/2019  ELNITA SURPRENANT March 01, 1946 [id number]  Transition of care  Referral date:01/02/19 Referral source:discharged from [facility] on  01/01/19 Referral reason:Transition of care Insurance:[employer] health care  Telephone call to patient regarding transition of care follow up. Unable to reach patient. HIPAA compliant voice message left with call back phone number.   PLAN; RNCM will attempt 2nd telephone call to patient within 4 business day.   Quinn Plowman RN,BSN,CCM [facility] Telephonic  [phone]   .

## 2019-01-04 ENCOUNTER — Telehealth: Payer: Self-pay | Admitting: *Deleted

## 2019-01-04 NOTE — Telephone Encounter (Signed)
Pt has requested that [facility] PT not start until Saturday so that her daughter can be there.   Please let Anda Kraft (PT) know if that is ok. Christen Bame, CMA

## 2019-01-06 ENCOUNTER — Ambulatory Visit: Payer: Self-pay

## 2019-01-07 DIAGNOSIS — D509 Iron deficiency anemia, unspecified: Secondary | ICD-10-CM | POA: Diagnosis not present

## 2019-01-07 DIAGNOSIS — Z87891 Personal history of nicotine dependence: Secondary | ICD-10-CM | POA: Diagnosis not present

## 2019-01-07 DIAGNOSIS — J45909 Unspecified asthma, uncomplicated: Secondary | ICD-10-CM | POA: Diagnosis not present

## 2019-01-07 DIAGNOSIS — M109 Gout, unspecified: Secondary | ICD-10-CM | POA: Diagnosis not present

## 2019-01-07 DIAGNOSIS — E559 Vitamin D deficiency, unspecified: Secondary | ICD-10-CM | POA: Diagnosis not present

## 2019-01-07 DIAGNOSIS — I340 Nonrheumatic mitral (valve) insufficiency: Secondary | ICD-10-CM | POA: Diagnosis not present

## 2019-01-07 DIAGNOSIS — K219 Gastro-esophageal reflux disease without esophagitis: Secondary | ICD-10-CM | POA: Diagnosis not present

## 2019-01-07 DIAGNOSIS — I447 Left bundle-branch block, unspecified: Secondary | ICD-10-CM | POA: Diagnosis not present

## 2019-01-07 DIAGNOSIS — I130 Hypertensive heart and chronic kidney disease with heart failure and stage 1 through stage 4 chronic kidney disease, or unspecified chronic kidney disease: Secondary | ICD-10-CM | POA: Diagnosis not present

## 2019-01-07 DIAGNOSIS — N183 Chronic kidney disease, stage 3 (moderate): Secondary | ICD-10-CM | POA: Diagnosis not present

## 2019-01-07 DIAGNOSIS — I5033 Acute on chronic diastolic (congestive) heart failure: Secondary | ICD-10-CM | POA: Diagnosis not present

## 2019-01-12 ENCOUNTER — Telehealth (INDEPENDENT_AMBULATORY_CARE_PROVIDER_SITE_OTHER): Payer: Medicare Other | Admitting: Family Medicine

## 2019-01-12 ENCOUNTER — Other Ambulatory Visit: Payer: Self-pay

## 2019-01-12 DIAGNOSIS — I5031 Acute diastolic (congestive) heart failure: Secondary | ICD-10-CM

## 2019-01-12 DIAGNOSIS — Z862 Personal history of diseases of the blood and blood-forming organs and certain disorders involving the immune mechanism: Secondary | ICD-10-CM

## 2019-01-12 DIAGNOSIS — I672 Cerebral atherosclerosis: Secondary | ICD-10-CM

## 2019-01-12 DIAGNOSIS — F0151 Vascular dementia with behavioral disturbance: Secondary | ICD-10-CM

## 2019-01-12 DIAGNOSIS — F01518 Vascular dementia, unspecified severity, with other behavioral disturbance: Secondary | ICD-10-CM

## 2019-01-12 NOTE — Assessment & Plan Note (Signed)
Stable by history.  Taking metoprolol which on discharge summary was to be stopped but since doing well without symptoms will continue till cardiology follow up

## 2019-01-12 NOTE — Assessment & Plan Note (Signed)
Seems stable with controlled behavior and anxiety.  Continue trazadone

## 2019-01-12 NOTE — Progress Notes (Signed)
[facility] Telemedicine Visit  Patient consented to have virtual visit. Method of visit: Video  Encounter participants: Patient: Hannah [name] - located at home  Provider: [name] - located at office Others (if applicable): daughter   Chief Complaint: Follow up hospitlization  HPI:  CHF - no episodes since discharge.  No edema or shortness of breath or chest pain with exertion.  Taking all medication including metoprolol 25 mg twice a day. No lightheadness or near syncopde  DEMENTIA - sleeping through night with trazadone.  Mood is generally good and pleasant.  No focal weakness or loss of speech  ANEMIA - taking iron regularly.  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  Continue trazadone  Acute diastolic (congestive) heart failure (HCC) Stable by history.  Taking metoprolol which on discharge summary was to be stopped but since doing well without symptoms will continue till cardiology follow up   History of iron deficiency anemia No symptoms Continue iron     Time spent during visit with patient: 23 minutes

## 2019-01-12 NOTE — Assessment & Plan Note (Signed)
No symptoms Continue iron

## 2019-01-12 NOTE — Patient Outreach (Signed)
[facility]) Care Management  01/12/2019  Hannah [name] 1945/12/04 [id number]  Transition of care  Referral date:01/02/19 Referral source:discharged from [facility] on  01/01/19 Referral reason:Transition of care Insurance:United health care  Outreach attempt #2  Telephone call to patient/ daughter for transition of care follow up. Unable to reach patient or patient's daughter.  HIPAA compliant voice message left with call back phone number.    PLAN: RNCM will attempt 3rd telephone call to patient within 4 business days.  RNCM will send outreach letter to attempt contact.   Quinn Plowman RN,BSN,CCM [facility] Telephonic  [phone]

## 2019-01-16 ENCOUNTER — Ambulatory Visit: Payer: Medicare Other | Admitting: Thoracic Surgery (Cardiothoracic Vascular Surgery)

## 2019-01-16 ENCOUNTER — Other Ambulatory Visit: Payer: Self-pay

## 2019-01-16 ENCOUNTER — Encounter: Payer: Self-pay | Admitting: Thoracic Surgery (Cardiothoracic Vascular Surgery)

## 2019-01-16 VITALS — BP 145/64 | HR 100 | Temp 97.7°F | Resp 20 | Ht 62.0 in | Wt 167.0 lb

## 2019-01-16 DIAGNOSIS — I340 Nonrheumatic mitral (valve) insufficiency: Secondary | ICD-10-CM

## 2019-01-16 NOTE — H&P (View-Only) (Signed)
HE[facility] AND [place] SURGERY CONSULTATION REPORT  Referring Provider is Hannah Mocha, MD Primary Cardiologist is Hannah Rouge, MD PCP is Hannah Covert, MD  Chief Complaint  Patient presents with   Consult    surgical eval, Cardiac Cath 12/29/18, TEE 12/30/18, ECHO 10/31/18, CTA C/A/P 11/06/18     HPI:  Patient is 73 year old obese female with history of mitral regurgitation, chronic combined systolic and diastolic congestive heart failure, hypertension, dementia, GI bleeding, and anxiety who has been referred for surgical consultation to discuss treatment options for management of severe symptomatic mitral regurgitation following a recent hospitalization for acute exacerbation of chronic congestive heart failure.  Patient and her family deny any long history of cardiac disease or heart murmur.  The patient has developed memory loss and anxiety which has slowly progressed over the last few years.  She lives locally in [place] with 1 of her daughters and has remained functionally independent for the most part.  However, she stopped driving her automobile more than 2 years ago, although she denies that she stopped driving on questioning.  She has reportedly developed progressive exertional shortness of breath and anxiety.  Since early July she has been seen in the emergency department 11 times and hospitalized on 5 different occasions with acute exacerbation of congestive heart failure and pulmonary edema.  At the time of her initial presentation she was notably anemic with active GI bleeding for which she underwent mesenteric artery coil embolization.  Echocardiogram performed at that time revealed normal left ventricular systolic function with moderate mitral regurgitation.  Most recently she was hospitalized with another exacerbation of congestive heart failure and transesophageal echocardiogram revealed low normal left  ventricular systolic function with severe mitral regurgitation.  Diagnostic cardiac catheterization was notable for the absence of significant coronary artery disease and moderate pulmonary hypertension.  The patient has been evaluated previously by Dr. Burt Knack, and she has now been referred for surgical consultation to discuss treatment options further.  Patient is widowed and lives locally in [place] with 1 of her daughters.  She has a total of 5 adult children.  She has been retired since 2009 having previously worked as a Freight forwarder at [employer].  She claims to remain functionally independent although her family states that she does not walk much at all because of severe shortness of breath over the last few months.  She gets tired very easily.  She has problems with orthopnea and recurrent PND.  She has not had any chest pain or chest tightness.  She has a great deal of difficulty with short-term memory and cannot recount most of her review of systems.  She admits to some arthritis and difficulty walking.  Past Medical History:  Diagnosis Date   Anemia    Anxiety    Anxiety state 08/09/2008   Spoke with Daughter came up with following plan  If patient has episode of shortness of breath or chest pain or distress they will check her pulse ox  If < 85 or she seems to lose cx they will cal 911  Other wise will treat as panic attack and give buspar up to 2 caps four times a day and try to calm and redirect her.      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   SOB (shortness of breath) 05/08/2015   Vitamin B12 deficiency 12/28/2018    Past Surgical History:  Procedure Laterality Date   BIOPSY  10/28/2018   Procedure: BIOPSY;  Surgeon: Hannah Craver, MD;  Location: [facility] ENDOSCOPY;  Service: Endoscopy;;   CARDIAC CATHETERIZATION  05/2004   COLONOSCOPY  07/2002   Archie Endo 09/02/2010   COLONOSCOPY WITH PROPOFOL N/A 10/28/2018   Procedure: COLONOSCOPY WITH PROPOFOL;  Surgeon: Hannah Craver, MD;  Location: [facility] ENDOSCOPY;  Service: Endoscopy;  Laterality: N/A;   ESOPHAGOGASTRODUODENOSCOPY  07/2002   w/biopsies/notes 09/02/2010   ESOPHAGOGASTRODUODENOSCOPY (EGD) WITH PROPOFOL N/A 10/28/2018   Procedure: ESOPHAGOGASTRODUODENOSCOPY (EGD) WITH PROPOFOL;  Surgeon: Hannah Craver, MD;  Location: [facility] ENDOSCOPY;  Service: Endoscopy;  Laterality: N/A;   HOT HEMOSTASIS N/A 10/28/2018   Procedure: HOT HEMOSTASIS (ARGON PLASMA COAGULATION/BICAP);  Surgeon: Hannah Craver, MD;  Location: [facility] ENDOSCOPY;  Service: Endoscopy;  Laterality: N/A;   IR ANGIOGRAM SELECTIVE EACH ADDITIONAL VESSEL  10/30/2018   IR ANGIOGRAM SELECTIVE EACH ADDITIONAL VESSEL  10/30/2018   IR ANGIOGRAM SELECTIVE EACH ADDITIONAL VESSEL  10/30/2018   IR ANGIOGRAM SELECTIVE EACH ADDITIONAL VESSEL  10/30/2018   IR ANGIOGRAM VISCERAL SELECTIVE  10/30/2018   IR EMBO ART  VEN HEMORR LYMPH EXTRAV  INC GUIDE ROADMAPPING  10/30/2018   IR US GUIDANCE  10/30/2018   POLYPECTOMY  10/28/2018   Procedure: POLYPECTOMY;  Surgeon: Hannah Craver, MD;  Location: [place] ENDOSCOPY;  Service: Endoscopy;;   RIGHT/LEFT HEART CATH AND CORONARY ANGIOGRAPHY N/A 12/29/2018   Procedure: RIGHT/LEFT HEART CATH AND CORONARY ANGIOGRAPHY;  Surgeon: Hannah Man, MD;  Location: [place] CV LAB;  Service: Cardiovascular;  Laterality: N/A;   TEE WITHOUT CARDIOVERSION N/A 12/30/2018   Procedure: TRANSESOPHAGEAL ECHOCARDIOGRAM (TEE);  Surgeon: Hannah Perla, MD;  Location: [facility] ENDOSCOPY;  Service: Cardiovascular;   Laterality: N/A;    Family History  Problem Relation Age of Onset   Heart disease Mother        died at age 64   Breast cancer Sister    Cancer Sister        Breast   Hypertension Father    Hypertension Sister     Social History   Socioeconomic History   Marital status: Widowed    Spouse name: Chrissie Noa   Number of children: 4   Years of education: 12   Highest education level: High school graduate  Occupational History   Occupation: Retired     Comment: Environmental consultant strain: Not hard at [employer] insecurity    Worry: Never true    Inability: Never true   Transportation needs    Medical: No    Non-medical: No  Tobacco Use   Smoking status: Former Smoker    Packs/day: 0.30    Years: 8.00    Pack years: 2.40    Types: Cigarettes    Quit date: 05/30/2004    Years since quitting: 14.6   Smokeless tobacco: Never Used  Substance and Sexual Activity   Alcohol use: No    Alcohol/week: 0.0 standard drinks   Drug use: Never   Sexual activity: Not Currently  Lifestyle   Physical activity  Days per week: 0 days    Minutes per session: 0 min   Stress: Not at all  Relationships   Social connections    Talks on phone: More than three times a week    Gets together: More than three times a week    Attends religious service: More than 4 times per year    Active member of club or organization: Yes    Attends meetings of clubs or organizations: Never    Relationship status: Widowed   Intimate partner violence    Fear of current or ex partner: No    Emotionally abused: No    Physically abused: No    Forced sexual activity: No  Other Topics Concern   Not on file  Social History Narrative   Emergency Contact: [name] [phone]   End of Life Plan: gave pt ad pamplet   Any pets: 1 Dog    Diet: pt has a varied diet, low consumption of carbs and fatty foods    Exercise: pt does not have a regular exercise routine     Seatbelts: pt wears seat regularly in car   Hobbies: spending time with grandchildren      *Updated 09/26/2018*   Patients daughter [name] lives with her and assists her in driving to doctors apts and errands. Patient does have a renewed drivers licenses, but feels more comfortable riding with her daughter. Patient enjoys going out to eat and spending time with her grandchildren who live in [place]. Patient stated they come during the summer to see her. Patient is excited her son, Vicente Males, is about to finish school to become a Theme park manager.    The patient has great family support system.     Current Outpatient Medications  Medication Sig Dispense Refill   acetaminophen (TYLENOL) 325 MG tablet Take 650 mg by mouth every 6 (six) hours as needed for mild pain or headache.     atorvastatin (LIPITOR) 40 MG tablet Take 1 tablet (40 mg total) by mouth daily. 90 tablet 3   busPIRone (BUSPAR) 5 MG tablet Take 5 mg by mouth 2 (two) times daily as needed (anxiety attacks).  60 tablet 1   ferrous sulfate 324 MG TBEC Take 324 mg by mouth.     furosemide (LASIX) 40 MG tablet Take 1 tablet (40 mg total) by mouth 2 (two) times daily. 60 tablet 0   metoprolol tartrate (LOPRESSOR) 25 MG tablet TAKE 1/2 TABLET BY MOUTH 2 TIMES DAILY 90 tablet 0   Multiple Vitamins-Minerals (ADULT ONE DAILY GUMMIES) CHEW Chew 1 tablet by mouth daily.     omeprazole (PRILOSEC) 20 MG capsule TAKE 1 CAPSULE BY MOUTH DAILY AS NEEDED (Patient taking differently: Take 20 mg by mouth at bedtime. ) 180 capsule 0   traZODone (DESYREL) 50 MG tablet Take 0.5 tablets (25 mg total) by mouth at bedtime as needed for sleep. 30 tablet 0   vitamin B-12 1000 MCG tablet Take 1 tablet (1,000 mcg total) by mouth daily. 30 tablet 0   No current facility-administered medications for this visit.     No Known Allergies    Review of Systems:   General:  normal appetite, decreased energy, no weight gain, no weight loss, no fever  Cardiac:  no  chest pain with exertion, no chest pain at rest, +SOB with exertion, occasional resting SOB, + PND, + orthopnea, no palpitations, no arrhythmia, no atrial fibrillation, + LE edema, no dizzy spells, no syncope  Respiratory:  + shortness of breath,  no home oxygen, no productive cough, no dry cough, no bronchitis, no wheezing, no hemoptysis, no asthma, no pain with inspiration or cough, no sleep apnea, no CPAP at night  GI:   no difficulty swallowing, + reflux, + frequent heartburn, no hiatal hernia, no abdominal pain, no constipation, no diarrhea, no hematochezia, no hematemesis, + melena  GU:   no dysuria,  no frequency, no urinary tract infection, no hematuria, no kidney stones, no kidney disease  Vascular:  no pain suggestive of claudication, no pain in feet, no leg cramps, no varicose veins, no DVT, no non-healing foot ulcer  Neuro:   no stroke, no TIA's, no seizures, no headaches, no temporary blindness one eye,  no slurred speech, no peripheral neuropathy, no chronic pain, some instability of gait, + significant memory/cognitive dysfunction  Musculoskeletal: + arthritis, no joint swelling, no myalgias, slight difficulty walking, somewhat limited mobility   Skin:    rash,  itching,  skin infections,  pressure sores or ulcerations  Psych:   + anxiety, no depression, + nervousness, no unusual recent stress  Eyes:   no blurry vision, no floaters, no recent vision changes, + wears glasses for reading  ENT:   no hearing loss, no loose or painful teeth, no dentures, last saw dentist 2018  Hematologic:  + easy bruising, no abnormal bleeding, no clotting disorder, no frequent epistaxis  Endocrine:  no diabetes, does not check CBG's at home           Physical Exam:   BP (!) 145/64    Pulse 100    Temp 97.7 F (36.5 C) (Skin)    Resp 20    Ht _0  (1.575 m)    Wt 167 lb (75.8 kg)    SpO2 94% Comment: RA   BMI 30.54 kg/m   General:  Obese female NAD  HEENT:  Unremarkable   Neck:   no JVD, no  bruits, no adenopathy   Chest:   clear to auscultation, symmetrical breath sounds, no wheezes, no rhonchi   CV:   RRR, grade III/VI blowing holosystolic murmur heard best at apex,  no diastolic murmur  Abdomen:  soft, non-tender, no masses   Extremities:  warm, well-perfused, pulses not palpable, no LE edema  Rectal/GU  Deferred  Neuro:   Grossly non-focal and symmetrical throughout  Skin:   Clean and dry, no rashes, no breakdown   Diagnostic Tests:    ECHOCARDIOGRAM REPORT       Patient Name:   STEVI HOLLINSHEAD Date of Exam: 10/31/2018 Medical Rec #:  [id number]         Height:       62.2 in Accession #:    [id number]        Weight:       160.0 lb Date of Birth:  09/08/1945         BSA:          1.74 m Patient Age:    70 years          BP:           144/62 mmHg Patient Gender: F                 HR:           93 bpm. Exam Location:  Inpatient    Procedure: 2D Echo  Indications:    congestive heart failure 428.0   History:        Patient has prior history of  Echocardiogram examinations, most                 recent 06/05/2004.   Sonographer:    Johny Chess Referring Phys: [place]    1. The left ventricle has normal systolic function with an ejection fraction of 60-65%. The cavity size was normal. Left ventricular diastolic Doppler parameters are consistent with pseudonormalization.  2. The right ventricle has normal systolic function. The cavity was normal. There is no increase in right ventricular wall thickness.  3. Left atrial size was moderately dilated.  4. Mild thickening of the mitral valve leaflet. Mitral valve regurgitation is mild to moderate by color flow Doppler.  5. MR not seen well on TTE. Appears mild to moderate. Consider TEE if more information needed.  6. The tricuspid valve is grossly normal.  7. The aortic valve is tricuspid. Mild calcification of the aortic valve.  8. The inferior vena cava was dilated in size  with >50% respiratory variability.  9. The interatrial septum was not well visualized.  FINDINGS  Left Ventricle: The left ventricle has normal systolic function, with an ejection fraction of 60-65%. The cavity size was normal. There is no increase in left ventricular wall thickness. Left ventricular diastolic Doppler parameters are consistent with  pseudonormalization.  Right Ventricle: The right ventricle has normal systolic function. The cavity was normal. There is no increase in right ventricular wall thickness.  Left Atrium: Left atrial size was moderately dilated.  Right Atrium: Right atrial size was normal in size. Right atrial pressure is estimated at 10 mmHg.  Interatrial Septum: The interatrial septum was not well visualized.  Pericardium: There is no evidence of pericardial effusion.  Mitral Valve: The mitral valve is normal in structure. Mild thickening of the mitral valve leaflet. Mitral valve regurgitation is mild to moderate by color flow Doppler. MR not seen well on TTE. Appears mild to moderate. Consider TEE if more information  needed.  Tricuspid Valve: The tricuspid valve is grossly normal. Tricuspid valve regurgitation is trivial by color flow Doppler.  Aortic Valve: The aortic valve is tricuspid Mild calcification of the aortic valve. Aortic valve regurgitation was not visualized by color flow Doppler.  Pulmonic Valve: The pulmonic valve was grossly normal. Pulmonic valve regurgitation is not visualized by color flow Doppler.  Venous: The inferior vena cava is dilated in size with greater than 50% respiratory variability.    +--------------+--------++  LEFT VENTRICLE            +----------------+---------++ +--------------+--------++  Diastology                    PLAX 2D                   +----------------+---------++ +--------------+--------++  LV e' lateral:   8.92 cm/s    LVIDd:         4.90 cm     +----------------+---------++ +--------------+--------++  LV E/e' lateral: 16.1         LVIDs:         3.50 cm    +----------------+---------++ +--------------+--------++  LV e' medial:    9.25 cm/s    LV PW:         0.90 cm    +----------------+---------++ +--------------+--------++  LV E/e' medial:  15.6         LV IVS:        1.00 cm    +----------------+---------++ +--------------+--------++  LVOT diam:  2.00 cm    +--------------+--------++  LV SV:         62 ml      +--------------+--------++  LV SV Index:   34.25      +--------------+--------++  LVOT Area:     3.14 cm   +--------------+--------++                            +--------------+--------++  +---------------+----------++  RIGHT VENTRICLE              +---------------+----------++  RV S prime:     14.50 cm/s   +---------------+----------++  TAPSE (M-mode): 3.4 cm       +---------------+----------++  +---------------+-------++-----------++  LEFT ATRIUM              Index         +---------------+-------++-----------++  LA diam:        4.50 cm  2.58 cm/m    +---------------+-------++-----------++  LA Vol (A2C):   51.7 ml  29.65 ml/m   +---------------+-------++-----------++  LA Vol (A4C):   52.1 ml  29.88 ml/m   +---------------+-------++-----------++  LA Biplane Vol: 53.9 ml  30.91 ml/m   +---------------+-------++-----------++ +------------+---------++----------++  RIGHT ATRIUM            Index        +------------+---------++----------++  RA Pressure: 3.00 mmHg               +------------+---------++----------++  RA Area:     8.21 cm                +------------+---------++----------++  RA Volume:   13.50 ml   7.74 ml/m   +------------+---------++----------++  +------------+-----------++  AORTIC VALVE               +------------+-----------++  LVOT Vmax:   109.00 cm/s   +------------+-----------++  LVOT Vmean:  77.700 cm/s   +------------+-----------++  LVOT VTI:    0.264 m        +------------+-----------++   +-------------+-------++  AORTA                   +-------------+-------++  Ao Root diam: 2.60 cm   +-------------+-------++  +--------------+----------++    +---------------+---------++  MITRAL VALVE                    TRICUSPID VALVE             +--------------+----------++    +---------------+---------++  MV Area (PHT): 4.15 cm         Estimated RAP:  3.00 mmHg   +--------------+----------++    +---------------+---------++  MV PHT:        53.07 msec   +--------------+----------++    +--------------+-------+  MV Decel Time: 183 msec         SHUNTS                  +--------------+----------++    +--------------+-------+ +----------------+-----------++  Systemic VTI:  0.26 m    MR Peak grad:    102.4 mmHg    +--------------+-------+ +----------------+-----------++  Systemic Diam: 2.00 cm   MR Mean grad:    65.0 mmHg     +--------------+-------+ +----------------+-----------++  MR Vmax:         506.00 cm/s   +----------------+-----------++  MR Vmean:        388.0 cm/s    +----------------+-----------++  MR PISA:         1.57 cm      +----------------+-----------++  MR PISA Eff ROA: 12 mm        +----------------+-----------++  MR PISA Radius:  0.50 cm       +----------------+-----------++ +--------------+-----------++  MV E velocity: 144.00 cm/s   +--------------+-----------++  MV A velocity: 107.00 cm/s   +--------------+-----------++  MV E/A ratio:  1.35          +--------------+-----------++    Glori Bickers MD Electronically signed by Glori Bickers MD Signature Date/Time: 10/31/2018/6:12:29 PM       TRANSESOPHOGEAL ECHO REPORT       Patient Name:   Roselie Awkward Date of Exam: 12/30/2018 Medical Rec #:  [id number]         Height:       62.0 in Accession #:    [id number]        Weight:       165.3 lb Date of Birth:  [birth year]         BSA:          1.76 m Patient Age:    6 years          BP:           138/44  mmHg Patient Gender: F                 HR:           87 bpm. Exam Location:  Inpatient    Procedure: Transesophageal Echo, Cardiac Doppler, Color Doppler and 3D Echo  Indications:     Mitral regurgitaion   History:         Patient has prior history of Echocardiogram examinations, most                  recent 10/31/2018.   Sonographer:     Johny Chess Referring Phys:  [id number] Leanor Kail Diagnosing Phys: Kirk Ruths MD     PROCEDURE: Patients was under conscious sedation during this procedure. Anesthetic was administered intravenously by performing Physician: 68mg of Fentanyl, 2.044mof Versed. The transesophogeal probe was passed through the esophogus of the patient. The  patient developed no complications during the procedure.  IMPRESSIONS    1. The left ventricle has low normal systolic function, with an ejection fraction of 50-55%. No evidence of left ventricular regional wall motion abnormalities.  2. The right ventricle has normal systolc function. The cavity was normal.  3. Left atrial size was severely dilated.  4. No evidence of a thrombus present in the left atrial appendage.  5. Trivial pericardial effusion is present.  6. The mitral valve is abnormal. Mild thickening of the mitral valve leaflet. Mitral valve regurgitation is severe by color flow Doppler.  7. The aortic valve is tricuspid No stenosis of the aortic valve.  8. There is evidence of moderate plaque in the descending aorta.  9. Low normal LV systolic function; posterior MV leaflet appears to be restricted with severe MR; severe LAE.  FINDINGS  Left Ventricle: The left ventricle has low normal systolic function, with an ejection fraction of 50-55%. No evidence of left ventricular regional wall motion abnormalities.  Right Ventricle: The right ventricle has normal systolic function. The cavity was normal.  Left Atrium: Left atrial size was severely dilated.   Left Atrial  Appendage: No evidence of a thrombus present in the left atrial appendage.  Right Atrium: Right atrial size was normal in size.  Interatrial Septum: No atrial level shunt detected by color flow Doppler.  Pericardium: Trivial pericardial effusion is present.  Mitral Valve: The mitral valve is abnormal. Mild thickening of the mitral valve leaflet. Mitral valve regurgitation is severe by color flow Doppler.  Tricuspid Valve: The tricuspid valve was normal in structure. Tricuspid valve regurgitation is trivial by color flow Doppler.  Aortic Valve: The aortic valve is tricuspid Aortic valve regurgitation was not visualized by color flow Doppler. There is No stenosis of the aortic valve.  Pulmonic Valve: The pulmonic valve was normal in structure. Pulmonic valve regurgitation is trivial by color flow Doppler.  Aorta: There is evidence of moderate plaque in the descending aorta.  Additional Findings: Low normal LV systolic function; posterior MV leaflet appears to be restricted with severe MR; severe LAE.    +-------------+---------++  MITRAL VALVE              +-------------+---------++  MV Peak grad: 5.7 mmHg    +-------------+---------++  MV Mean grad: 3.0 mmHg    +-------------+---------++  MV Vmax:      1.19 m/s    +-------------+---------++  MV Vmean:     82.5 cm/s   +-------------+---------++  MV VTI:       0.29 m      +-------------+---------++ +----------------+-----------++  MR Peak grad:    142.1 mmHg    +----------------+-----------++  MR Mean grad:    78.0 mmHg     +----------------+-----------++  MR Vmax:         596.00 cm/s   +----------------+-----------++  MR Vmean:        402.0 cm/s    +----------------+-----------++  MR PISA:         3.08 cm      +----------------+-----------++  MR PISA Eff ROA: 20 mm        +----------------+-----------++  MR PISA Radius:  0.70 cm       +----------------+-----------++    Kirk Ruths MD Electronically  signed by Kirk Ruths MD Signature Date/Time: 12/30/2018/5:15:07 PM    RIGHT/LEFT HEART CATH AND CORONARY ANGIOGRAPHY  Conclusion    There is severe (3-4+) mitral regurgitation. Angiographically 3+, but based on MR related V wave would be 4+.  The left ventricular systolic function is normal. The left ventricular ejection fraction is 50-55% by visual estimate.  Hemodynamic findings consistent with moderate pulmonary hypertension and mitral valve regurgitation & LV end diastolic pressure is moderately elevated.  Angiographically normal coronary arteries   SUMMARY  Likely SEVERE MITRAL REGURGITATION with large V wave on PCWP waveform and PA waveform with at least 3-4+ MR on LV gram.  Moderate Secondary Pulmonary Hypertension related to elevated LVEDP and MR.  Angiographically normal coronary arteries  RECOMMENDATIONS  Patient likely requires additional diuresis, especially in light of planned PRBC  Closely monitor hemoglobin and renal function (minimal contrast use)  Would consider now TEE as part of formal evaluation for MR if there is consideration for possible Mitral Clip   Glenetta Hew, M.D., M.S. Interventional Cardiologist   Pager # [phone] Phone # [phone] [address]. [place], [place] [address]    Recommendations  Antiplatelet/Anticoag No indication for antiplatelet therapy at this time .  Indications  Acute on chronic diastolic heart failure (HCC) [I50.33 (ICD-10-CM)]  Acute pulmonary edema (HCC) [J81.0 (ICD-10-CM)]  Nonrheumatic mitral valve regurgitation [I34.0 (ICD-10-CM)]  Procedural Details  Technical Details Primary Care Provider/Attending Physician: Hannah Covert, MD / Dr. McDiarmid Primary-Referring cardiologist: Hannah Rouge, MD   Hannah [name] is a 73 y.o. female with a hx of chronic dCHF, mitral regurgitation,  HTN, HLD, h/o GIB requiring  transfusion, chronic iron deficiency anemia, asthma, GERD,  anxiety and dementia, who was seen in consultation by Dr. Johnsie Cancel yesterday for the evaluation of a/c dCHF thought to be potentially related to mild regurgitation.  Dr. Johnsie Cancel was concerned about the severity of much regurgitation, and is recommended right left heart catheterization for evaluation of her PA pressures and PCWP/LV gram assessment of mitral regurgitation.  Time Out: Verified patient identification, verified procedure, site/side was marked, verified correct patient position, special equipment/implants available, medications/allergies/relevent history reviewed, required imaging and test results available. Performed.  Access:  RIGHT radial Artery: 6 Fr sheath -- Seldinger technique using Micropuncture Kit --  Direct ultrasound guidance used.  Permanent image obtained and placed on chart. -- 10 mL radial cocktail IA; 4000 units IV Heparin  * Right Brachial/Antecubital Vein: And 18-gauge IV was placed by the Cath Lab RN in the Cath Lab.  This was exchanged over a wire for a 5Fr sheath  Right Heart Catheterization: 5 Fr Gordy Councilman catheter advanced under fluoroscopy with balloon inflated to the RA, RV, then PCWP-PA for hemodynamic measurement. An angled Glidewire was used to advanced the catheter from the innominate vein into the pulmonary artery. * Simultaneous FA & PA blood gases checked for SaO2% to calculate FICK CO/CI  * Sequential PCWP/LV & RV/LV pressures monitored followed by TIG 4.0 catheter in LV.  * Catheter removed completely out of the body with balloon deflated.  Left Heart Catheterization: 5 Fr Catheters advanced or exchanged over a J-wire under direct fluoroscopic guidance into the ascending aorta; TIG 4.0 catheter advanced first.  * LV Hemodynamics (LV Gram): TIG 4.0 catheter and Angled Pigtail Catheter * Left & Right Coronary Artery Cineangiography: TIG 4.0 catheter   Upon completion of Angiogaphy, the catheter was removed completely out of the body over a wire, without  complication.  Femoral / Brachial Sheath(s) removed in the Cath Lab with manual pressure for hemostasis.   Radial sheath removed in the Cardiac Catheterization lab with TR Band placed for hemostasis.  TR Band: 1215  Hours; 12 mL air  MEDICATIONS * SQ Lidocaine 74m * Radial Cocktail: 3 mg Verapmil in 10 mL NS * Isovue Contrast: 50 mL * Heparin: 4000 Units  Fluoro time: 5.6 minutes. Dose Area Product: 78206mGycm2. Cumulative Air Kerma: 127 mGy.  Estimated blood loss: 15 mL.   During this procedure no sedation was administered.  Medications (Filter: Administrations occurring from 12/29/18 1115 to 12/29/18 1225) (important)  Continuous medications are totaled by the amount administered until 12/29/18 1225.  Medication Rate/Dose/Volume Action  Date Time   lidocaine (PF) (XYLOCAINE) 1 % injection (mL) 2 mL Given 12/29/18 1144   Total dose as of 12/29/18 1225 2 mL Given 1147   4 mL        Radial Cocktail/Verapamil only (mL) 10 mL Given 12/29/18 1149   Total dose as of 12/29/18 1225        10 mL        heparin injection (Units) 4,000 Units Given 12/29/18 1159   Total dose as of 12/29/18 1225        4,000 Units        Heparin (Porcine) in NaCl 1000-0.9 UT/500ML-% SOLN (mL) 500 mL Given 12/29/18 1215   Total dose as of 12/29/18 1225 500 mL Given 1215   1,000 mL        iohexol (OMNIPAQUE) 350 MG/ML injection (mL) 50 mL Given 12/29/18 1216   Total dose as of 12/29/18 1225  50 mL        0.9 % sodium chloride infusion (Manually program via Guardrails IV Fluids) *Not included in total MAR Hold 12/29/18 1120   Dosing weight:  76.3 kg        Total dose as of 12/29/18 1225        Cannot be calculated        atorvastatin (LIPITOR) tablet 40 mg (mg) *Not included in total MAR Hold 12/29/18 1120   Total dose as of 12/29/18 1225        Cannot be calculated        busPIRone (BUSPAR) tablet 5 mg (mg) *Not included in total MAR Hold 12/29/18 1120   Total dose as of 12/29/18 1225         Cannot be calculated        ferrous sulfate tablet 325 mg (mg) *Not included in total MAR Hold 12/29/18 1120   Total dose as of 12/29/18 1225        Cannot be calculated        furosemide (LASIX) injection 40 mg (mg) *Not included in total MAR Hold 12/29/18 1120   Total dose as of 12/29/18 1225 *Not included in total MAR Unhold 1123   Cannot be calculated        metoprolol tartrate (LOPRESSOR) tablet 12.5 mg (mg) *Not included in total MAR Hold 12/29/18 1120   Total dose as of 12/29/18 1225        Cannot be calculated        nystatin cream (MYCOSTATIN) *Not included in total MAR Hold 12/29/18 1120   Total dose as of 12/29/18 1225        Cannot be calculated        pantoprazole (PROTONIX) EC tablet 40 mg (mg) *Not included in total MAR Hold 12/29/18 1120   Total dose as of 12/29/18 1225        Cannot be calculated        polyethylene glycol (MIRALAX / GLYCOLAX) packet 17 g (g) *Not included in total MAR Hold 12/29/18 1120   Total dose as of 12/29/18 1225        Cannot be calculated        sodium chloride flush (NS) 0.9 % injection 3 mL (mL) *Not included in total MAR Hold 12/29/18 1120   Total dose as of 12/29/18 1225        Cannot be calculated        traZODone (DESYREL) tablet 25 mg (mg) *Not included in total MAR Hold 12/29/18 1120   Total dose as of 12/29/18 1225        Cannot be calculated        vitamin B-12 (CYANOCOBALAMIN) tablet 1,000 mcg (mcg) *Not included in total MAR Hold 12/29/18 1120   Total dose as of 12/29/18 1225        Cannot be calculated        Contrast  Medication Name Total Dose  iohexol (OMNIPAQUE) 350 MG/ML injection 50 mL    Radiation/Fluoro  Fluoro time: 5.6 (min) DAP: 7577 (mGycm2) Cumulative Air Kerma: 462 (mGy)  Complications  Complications documented before study signed (12/29/2018 70:35 PM)   No complications were associated with this study.  Documented by Hannah Man, MD - 12/29/2018 12:29 PM    Coronary  Findings  Diagnostic Dominance: Right Left Main  Vessel was injected. Vessel is normal in caliber. Vessel is angiographically normal.  Left Anterior Descending  Vessel was injected. Vessel  is normal in caliber. Large-caliber Vessel is angiographically normal.  First Diagonal Branch  Vessel was injected. Vessel is normal in size. Vessel is moderate in size Vessel is angiographically normal.  First Septal Branch  Vessel was injected. Vessel is small in size. Vessel is angiographically normal.  Second Diagonal Branch  Vessel was injected. Vessel is small in size. Vessel is angiographically normal.  Second Septal Branch  Vessel was injected. Vessel is small in size. Vessel is angiographically normal.  Ramus Intermedius  Vessel was injected. Vessel is large. Vessel is angiographically normal.  Left Circumflex  Vessel was injected. Vessel is normal in caliber. Vessel is angiographically normal.  Left Posterior Atrioventricular Artery  Vessel was injected. Vessel is small in size. Vessel is angiographically normal.  Right Coronary Artery  Vessel was injected. Vessel is normal in caliber. Vessel is angiographically normal.  Acute Marginal Branch  Vessel was injected. Vessel is small in size. Vessel is angiographically normal.  Right Ventricular Branch  Vessel was injected. Vessel is small in size. Vessel is angiographically normal.  Right Posterior Descending Artery  Vessel was injected. Vessel is normal in size. Vessel is moderate in size Vessel is angiographically normal.  Right Posterior Atrioventricular Artery  Vessel was injected. Vessel is small in size. Vessel is angiographically normal.  First Right Posterolateral Branch  Vessel was injected. Vessel is small in size. Vessel is angiographically normal.  Second Right Posterolateral Branch  Vessel was injected. Vessel is small in size. Vessel is angiographically normal.  Intervention  No interventions have been documented. Right  Heart  Right Heart Pressures Hemodynamic findings consistent with moderate pulmonary hypertension and mitral valve regurgitation. PA P-mean: 51/23 mmHg, 37 mmHg PCWP: 25-45 mmHg (large V wave)-29 mmHg Elevated LV EDP consistent with volume overload. LV P-EDP: 110/19 mmHg - 24 mmHg Aortic pressure-MAP: 105/49 mmHg - 72 mmHg Ao sat 98%, PA sat 61%. CARDIAC OUTPUT-INDEX (Fick): 6.6-3.72  Right Atrium The right atrium is moderately dilated.  Right atrial pressure is elevated. RAP 18 mmHg  Right Ventricle The right ventricle is mildly dilated. RVP-EDP: 53/15 mmHg - 19 mmHg  Wall Motion  Resting       Cannot rule out some inferoseptal hypokinesis, could be from bundle branch block          Left Heart  Left Ventricle The left ventricle is mildly dilated. The left ventricular systolic function is normal. LV end diastolic pressure is moderately elevated. The left ventricular ejection fraction is 50-55% by visual estimate. There is severe (4+) mitral regurgitation. Angiographically 3+, but with huge V wave, more likely 4+  Aortic Valve There is no aortic valve stenosis. There is normal aortic valve motion.  Coronary Diagrams  Diagnostic Dominance: Right  Intervention  Implants   No implant documentation for this case.  Syngo Images  Show images for CARDIAC CATHETERIZATION  Images on Long Term Storage  Show images for Kateryn, [place] to Procedure Log  Procedure Log    Hemo Data   Most Recent Value  Fick Cardiac Output 6.6 L/min  Fick Cardiac Output Index 3.72 (L/min)/BSA  RA A Wave 20 mmHg  RA V Wave 17 mmHg  RA Mean 18 mmHg  RV Systolic Pressure 53 mmHg  RV Diastolic Pressure 15 mmHg  RV EDP 19 mmHg  PA Systolic Pressure 51 mmHg  PA Diastolic Pressure 23 mmHg  PA Mean 37 mmHg  PW A Wave 25 mmHg  PW V Wave 45 mmHg  PW Mean 29 mmHg  AO Systolic Pressure 153 mmHg  AO Diastolic Pressure 52 mmHg  AO Mean 72 mmHg  LV Systolic Pressure 794 mmHg  LV Diastolic  Pressure 19 mmHg  LV EDP 23 mmHg  AOp Systolic Pressure 327 mmHg  AOp Diastolic Pressure 49 mmHg  AOp Mean Pressure 72 mmHg  LVp Systolic Pressure 614 mmHg  LVp Diastolic Pressure 19 mmHg  LVp EDP Pressure 24 mmHg  QP/QS 1  TPVR Index 9.94 HRUI  TSVR Index 19.34 HRUI  PVR SVR Ratio 0.15  TPVR/TSVR Ratio 0.51      Impression:  Patient has stage D severe symptomatic mitral regurgitation.  She presents with symptoms of shortness of breath with low-level activity and occasionally at rest, PND and orthopnea all consistent with chronic combined systolic and diastolic congestive heart failure New York Heart Association functional class III and multiple recent hospital admissions for acute exacerbations of class IV symptoms with pulmonary edema on chest x-ray.    I have personally reviewed the patient's recent transesophageal echocardiogram and diagnostic cardiac catheterization.  TEE reveals low normal left ventricular systolic function with ejection fraction estimated 55% in the setting of severe mitral regurgitation.  The left ventricle is slightly enlarged.  The mitral annulus is dilated.  There is mild thickening of both the anterior and posterior leaflets but overall both leaflets move fairly well.  There is some calcification in the posterior mitral leaflet and subvalvular apparatus with likely significant restriction of the posterior leaflet.  There is severe mitral regurgitation resulting from a combination of both type I and type IIIa mitral valve dysfunction.    I agree the patient would benefit from mitral valve repair.  Under the circumstances her pathophysiology may represent a combination of mixed primary and secondary mitral regurgitation, but the patient has clearly failed optimal medical therapy over the past 2 months.  However, I would be reluctant to consider this patient a candidate for conventional surgical mitral valve repair because of her age and comorbid medical problems,  most notably her significant underlying dementia.  TEE reveals anatomical characteristics that may be suitable for transcatheter edge-to-edge repair.     Plan:  The patient and her family (2 daughters and 1 son) were counseled at length regarding treatment alternatives for management of severe symptomatic mitral regurgitation.  Alternative approaches such as conventional surgical mitral valve repair or replacement with or without minimally-invasive techniques, percutaneous edge-to-edge Mitraclip repair, and continued medical therapy without intervention were compared and contrasted at length.  The risks associated with conventional surgery were discussed in detail, as were expectations for post-operative convalescence, and why I would be reluctant to consider this patient a candidate for conventional surgery.  Issues specific to Mitraclip repair were discussed including questions about long term freedom from persistent or recurrent mitral regurgitation, the potential for device migration or embolization, and other technical complications related to the procedure itself.  Current availability of other catheter-based treatments for mitral regurgitation was discussed.  Long-term prognosis with medical therapy was discussed. This discussion was placed in the context of the patient's own specific clinical presentation and past medical history.  All of their questions have been addressed.  The patient is interested in proceeding with transcatheter edge-to-edge repair in the near future.    I spent in excess of 90 minutes during the conduct of this office consultation and >50% of this time involved direct face-to-face encounter with the patient for counseling and/or coordination of their care.      Valentina Gu. Roxy Manns, MD 01/16/2019  11:51 AM

## 2019-01-16 NOTE — Addendum Note (Signed)
Addended by: Rexene Alberts on: 01/16/2019 06:44 PM   Modules accepted: Level of Service

## 2019-01-16 NOTE — Patient Outreach (Signed)
[facility]) Care Management  01/16/2019  LEKISHA MCGHEE January 04, 1946 [id number]  Transition of care  Referral date:01/02/19 Referral source:discharged from [facility] on 01/01/19 Referral reason:Transition of care Insurance:[employer]  Outreach attempt #3  Telephone call to patient/ daughter for transition of care follow up. Unable to reach patient or patient's daughter.  HIPAA compliant voice message left with call back phone number.    PLAN: RNCM will attempt follow up outreach.   Quinn Plowman RN,BSN,CCM [facility] Telephonic  [phone]

## 2019-01-16 NOTE — Patient Instructions (Signed)
Continue all previous medications without any changes at this time  

## 2019-01-16 NOTE — Progress Notes (Signed)
HEART AND [place] SURGERY CONSULTATION REPORT  Referring Provider is Sherren Mocha, MD Primary Cardiologist is Jenkins Rouge, MD PCP is [name], MD  Chief Complaint  Patient presents with   Consult    surgical eval, Cardiac Cath 12/29/18, TEE 12/30/18, ECHO 10/31/18, CTA C/A/P 11/06/18     HPI:  Patient is 73 year old obese female with history of mitral regurgitation, chronic combined systolic and diastolic congestive heart failure, hypertension, dementia, GI bleeding, and anxiety who has been referred for surgical consultation to discuss treatment options for management of severe symptomatic mitral regurgitation following a recent hospitalization for acute exacerbation of chronic congestive heart failure.  Patient and her family deny any long history of cardiac disease or heart murmur.  The patient has developed memory loss and anxiety which has slowly progressed over the last few years.  She lives locally in [place] with 1 of her daughters and has remained functionally independent for the most part.  However, she stopped driving her automobile more than 2 years ago, although she denies that she stopped driving on questioning.  She has reportedly developed progressive exertional shortness of breath and anxiety.  Since early July she has been seen in the emergency department 11 times and hospitalized on 5 different occasions with acute exacerbation of congestive heart failure and pulmonary edema.  At the time of her initial presentation she was notably anemic with active GI bleeding for which she underwent mesenteric artery coil embolization.  Echocardiogram performed at that time revealed normal left ventricular systolic function with moderate mitral regurgitation.  Most recently she was hospitalized with another exacerbation of congestive heart failure and transesophageal echocardiogram revealed low normal left  ventricular systolic function with severe mitral regurgitation.  Diagnostic cardiac catheterization was notable for the absence of significant coronary artery disease and moderate pulmonary hypertension.  The patient has been evaluated previously by Dr. Burt Knack, and she has now been referred for surgical consultation to discuss treatment options further.  Patient is widowed and lives locally in [place] with 1 of her daughters.  She has a total of 5 adult children.  She has been retired since 2009 having previously worked as a Freight forwarder at [employer].  She claims to remain functionally independent although her family states that she does not walk much at all because of severe shortness of breath over the last few months.  She gets tired very easily.  She has problems with orthopnea and recurrent PND.  She has not had any chest pain or chest tightness.  She has a great deal of difficulty with short-term memory and cannot recount most of her review of systems.  She admits to some arthritis and difficulty walking.  Past Medical History:  Diagnosis Date   Anemia    Anxiety    Anxiety state 08/09/2008   Spoke with Daughter came up with following plan  If patient has episode of shortness of breath or chest pain or distress they will check her pulse ox  If < 85 or she seems to lose cx they will cal 911  Other wise will treat as panic attack and give buspar up to 2 caps four times a day and try to calm and redirect her.      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   SOB (shortness of breath) 05/08/2015   Vitamin B12 deficiency 12/28/2018    Past Surgical History:  Procedure Laterality Date   BIOPSY  10/28/2018   Procedure: BIOPSY;  Surgeon: Juanita Craver, MD;  Location: [facility] ENDOSCOPY;  Service: Endoscopy;;   CARDIAC CATHETERIZATION  05/2004   COLONOSCOPY  07/2002   Archie Endo 09/02/2010   COLONOSCOPY WITH PROPOFOL N/A 10/28/2018   Procedure: COLONOSCOPY WITH PROPOFOL;  Surgeon: Juanita Craver, MD;  Location: [facility] ENDOSCOPY;  Service: Endoscopy;  Laterality: N/A;   ESOPHAGOGASTRODUODENOSCOPY  07/2002   w/biopsies/notes 09/02/2010   ESOPHAGOGASTRODUODENOSCOPY (EGD) WITH PROPOFOL N/A 10/28/2018   Procedure: ESOPHAGOGASTRODUODENOSCOPY (EGD) WITH PROPOFOL;  Surgeon: Juanita Craver, MD;  Location: [facility] ENDOSCOPY;  Service: Endoscopy;  Laterality: N/A;   HOT HEMOSTASIS N/A 10/28/2018   Procedure: HOT HEMOSTASIS (ARGON PLASMA COAGULATION/BICAP);  Surgeon: Juanita Craver, MD;  Location: [facility] ENDOSCOPY;  Service: Endoscopy;  Laterality: N/A;   IR ANGIOGRAM SELECTIVE EACH ADDITIONAL VESSEL  10/30/2018   IR ANGIOGRAM SELECTIVE EACH ADDITIONAL VESSEL  10/30/2018   IR ANGIOGRAM SELECTIVE EACH ADDITIONAL VESSEL  10/30/2018   IR ANGIOGRAM SELECTIVE EACH ADDITIONAL VESSEL  10/30/2018   IR ANGIOGRAM VISCERAL SELECTIVE  10/30/2018   IR EMBO ART  VEN HEMORR LYMPH EXTRAV  INC GUIDE ROADMAPPING  10/30/2018   IR US GUIDANCE  10/30/2018   POLYPECTOMY  10/28/2018   Procedure: POLYPECTOMY;  Surgeon: Juanita Craver, MD;  Location: [place] ENDOSCOPY;  Service: Endoscopy;;   RIGHT/LEFT HEART CATH AND CORONARY ANGIOGRAPHY N/A 12/29/2018   Procedure: RIGHT/LEFT HEART CATH AND CORONARY ANGIOGRAPHY;  Surgeon: Leonie Man, MD;  Location: [place] CV LAB;  Service: Cardiovascular;  Laterality: N/A;   TEE WITHOUT CARDIOVERSION N/A 12/30/2018   Procedure: TRANSESOPHAGEAL ECHOCARDIOGRAM (TEE);  Surgeon: Lelon Perla, MD;  Location: [facility] ENDOSCOPY;  Service: Cardiovascular;   Laterality: N/A;    Family History  Problem Relation Age of Onset   Heart disease Mother        died at age 64   Breast cancer Sister    Cancer Sister        Breast   Hypertension Father    Hypertension Sister     Social History   Socioeconomic History   Marital status: Widowed    Spouse name: Chrissie Noa   Number of children: 4   Years of education: 12   Highest education level: High school graduate  Occupational History   Occupation: Retired     Comment: Environmental consultant strain: Not hard at [employer] insecurity    Worry: Never true    Inability: Never true   Transportation needs    Medical: No    Non-medical: No  Tobacco Use   Smoking status: Former Smoker    Packs/day: 0.30    Years: 8.00    Pack years: 2.40    Types: Cigarettes    Quit date: 05/30/2004    Years since quitting: 14.6   Smokeless tobacco: Never Used  Substance and Sexual Activity   Alcohol use: No    Alcohol/week: 0.0 standard drinks   Drug use: Never   Sexual activity: Not Currently  Lifestyle   Physical activity  Days per week: 0 days    Minutes per session: 0 min   Stress: Not at all  Relationships   Social connections    Talks on phone: More than three times a week    Gets together: More than three times a week    Attends religious service: More than 4 times per year    Active member of club or organization: Yes    Attends meetings of clubs or organizations: Never    Relationship status: Widowed   Intimate partner violence    Fear of current or ex partner: No    Emotionally abused: No    Physically abused: No    Forced sexual activity: No  Other Topics Concern   Not on file  Social History Narrative   Emergency Contact: [name] [phone]   End of Life Plan: gave pt ad pamplet   Any pets: 1 Dog    Diet: pt has a varied diet, low consumption of carbs and fatty foods    Exercise: pt does not have a regular exercise routine     Seatbelts: pt wears seat regularly in car   Hobbies: spending time with grandchildren      *Updated 09/26/2018*   Patients daughter [name] lives with her and assists her in driving to doctors apts and errands. Patient does have a renewed drivers licenses, but feels more comfortable riding with her daughter. Patient enjoys going out to eat and spending time with her grandchildren who live in [place]. Patient stated they come during the summer to see her. Patient is excited her son, Vicente Males, is about to finish school to become a Theme park manager.    The patient has great family support system.     Current Outpatient Medications  Medication Sig Dispense Refill   acetaminophen (TYLENOL) 325 MG tablet Take 650 mg by mouth every 6 (six) hours as needed for mild pain or headache.     atorvastatin (LIPITOR) 40 MG tablet Take 1 tablet (40 mg total) by mouth daily. 90 tablet 3   busPIRone (BUSPAR) 5 MG tablet Take 5 mg by mouth 2 (two) times daily as needed (anxiety attacks).  60 tablet 1   ferrous sulfate 324 MG TBEC Take 324 mg by mouth.     furosemide (LASIX) 40 MG tablet Take 1 tablet (40 mg total) by mouth 2 (two) times daily. 60 tablet 0   metoprolol tartrate (LOPRESSOR) 25 MG tablet TAKE 1/2 TABLET BY MOUTH 2 TIMES DAILY 90 tablet 0   Multiple Vitamins-Minerals (ADULT ONE DAILY GUMMIES) CHEW Chew 1 tablet by mouth daily.     omeprazole (PRILOSEC) 20 MG capsule TAKE 1 CAPSULE BY MOUTH DAILY AS NEEDED (Patient taking differently: Take 20 mg by mouth at bedtime. ) 180 capsule 0   traZODone (DESYREL) 50 MG tablet Take 0.5 tablets (25 mg total) by mouth at bedtime as needed for sleep. 30 tablet 0   vitamin B-12 1000 MCG tablet Take 1 tablet (1,000 mcg total) by mouth daily. 30 tablet 0   No current facility-administered medications for this visit.     No Known Allergies    Review of Systems:   General:  normal appetite, decreased energy, no weight gain, no weight loss, no fever  Cardiac:  no  chest pain with exertion, no chest pain at rest, +SOB with exertion, occasional resting SOB, + PND, + orthopnea, no palpitations, no arrhythmia, no atrial fibrillation, + LE edema, no dizzy spells, no syncope  Respiratory:  + shortness of breath,  no home oxygen, no productive cough, no dry cough, no bronchitis, no wheezing, no hemoptysis, no asthma, no pain with inspiration or cough, no sleep apnea, no CPAP at night  GI:   no difficulty swallowing, + reflux, + frequent heartburn, no hiatal hernia, no abdominal pain, no constipation, no diarrhea, no hematochezia, no hematemesis, + melena  GU:   no dysuria,  no frequency, no urinary tract infection, no hematuria, no kidney stones, no kidney disease  Vascular:  no pain suggestive of claudication, no pain in feet, no leg cramps, no varicose veins, no DVT, no non-healing foot ulcer  Neuro:   no stroke, no TIA's, no seizures, no headaches, no temporary blindness one eye,  no slurred speech, no peripheral neuropathy, no chronic pain, some instability of gait, + significant memory/cognitive dysfunction  Musculoskeletal: + arthritis, no joint swelling, no myalgias, slight difficulty walking, somewhat limited mobility   Skin:    rash,  itching,  skin infections,  pressure sores or ulcerations  Psych:   + anxiety, no depression, + nervousness, no unusual recent stress  Eyes:   no blurry vision, no floaters, no recent vision changes, + wears glasses for reading  ENT:   no hearing loss, no loose or painful teeth, no dentures, last saw dentist 2018  Hematologic:  + easy bruising, no abnormal bleeding, no clotting disorder, no frequent epistaxis  Endocrine:  no diabetes, does not check CBG's at home           Physical Exam:   BP (!) 145/64    Pulse 100    Temp 97.7 F (36.5 C) (Skin)    Resp 20    Ht _0  (1.575 m)    Wt 167 lb (75.8 kg)    SpO2 94% Comment: RA   BMI 30.54 kg/m   General:  Obese female NAD  HEENT:  Unremarkable   Neck:   no JVD, no  bruits, no adenopathy   Chest:   clear to auscultation, symmetrical breath sounds, no wheezes, no rhonchi   CV:   RRR, grade III/VI blowing holosystolic murmur heard best at apex,  no diastolic murmur  Abdomen:  soft, non-tender, no masses   Extremities:  warm, well-perfused, pulses not palpable, no LE edema  Rectal/GU  Deferred  Neuro:   Grossly non-focal and symmetrical throughout  Skin:   Clean and dry, no rashes, no breakdown   Diagnostic Tests:    ECHOCARDIOGRAM REPORT       Patient Name:   Hannah [name] Date of Exam: 10/31/2018 Medical Rec #:  [id number]         Height:       62.2 in Accession #:    [id number]        Weight:       160.0 lb Date of Birth:  09/08/1945         BSA:          1.74 m Patient Age:    70 years          BP:           144/62 mmHg Patient Gender: F                 HR:           93 bpm. Exam Location:  Inpatient    Procedure: 2D Echo  Indications:    congestive heart failure 428.0   History:        Patient has prior history of  Echocardiogram examinations, most                 recent 06/05/2004.   Sonographer:    Johny Chess Referring Phys: [place]    1. The left ventricle has normal systolic function with an ejection fraction of 60-65%. The cavity size was normal. Left ventricular diastolic Doppler parameters are consistent with pseudonormalization.  2. The right ventricle has normal systolic function. The cavity was normal. There is no increase in right ventricular wall thickness.  3. Left atrial size was moderately dilated.  4. Mild thickening of the mitral valve leaflet. Mitral valve regurgitation is mild to moderate by color flow Doppler.  5. MR not seen well on TTE. Appears mild to moderate. Consider TEE if more information needed.  6. The tricuspid valve is grossly normal.  7. The aortic valve is tricuspid. Mild calcification of the aortic valve.  8. The inferior vena cava was dilated in size  with >50% respiratory variability.  9. The interatrial septum was not well visualized.  FINDINGS  Left Ventricle: The left ventricle has normal systolic function, with an ejection fraction of 60-65%. The cavity size was normal. There is no increase in left ventricular wall thickness. Left ventricular diastolic Doppler parameters are consistent with  pseudonormalization.  Right Ventricle: The right ventricle has normal systolic function. The cavity was normal. There is no increase in right ventricular wall thickness.  Left Atrium: Left atrial size was moderately dilated.  Right Atrium: Right atrial size was normal in size. Right atrial pressure is estimated at 10 mmHg.  Interatrial Septum: The interatrial septum was not well visualized.  Pericardium: There is no evidence of pericardial effusion.  Mitral Valve: The mitral valve is normal in structure. Mild thickening of the mitral valve leaflet. Mitral valve regurgitation is mild to moderate by color flow Doppler. MR not seen well on TTE. Appears mild to moderate. Consider TEE if more information  needed.  Tricuspid Valve: The tricuspid valve is grossly normal. Tricuspid valve regurgitation is trivial by color flow Doppler.  Aortic Valve: The aortic valve is tricuspid Mild calcification of the aortic valve. Aortic valve regurgitation was not visualized by color flow Doppler.  Pulmonic Valve: The pulmonic valve was grossly normal. Pulmonic valve regurgitation is not visualized by color flow Doppler.  Venous: The inferior vena cava is dilated in size with greater than 50% respiratory variability.    +--------------+--------++  LEFT VENTRICLE            +----------------+---------++ +--------------+--------++  Diastology                    PLAX 2D                   +----------------+---------++ +--------------+--------++  LV e' lateral:   8.92 cm/s    LVIDd:         4.90 cm     +----------------+---------++ +--------------+--------++  LV E/e' lateral: 16.1         LVIDs:         3.50 cm    +----------------+---------++ +--------------+--------++  LV e' medial:    9.25 cm/s    LV PW:         0.90 cm    +----------------+---------++ +--------------+--------++  LV E/e' medial:  15.6         LV IVS:        1.00 cm    +----------------+---------++ +--------------+--------++  LVOT diam:  2.00 cm    +--------------+--------++  LV SV:         62 ml      +--------------+--------++  LV SV Index:   34.25      +--------------+--------++  LVOT Area:     3.14 cm   +--------------+--------++                            +--------------+--------++  +---------------+----------++  RIGHT VENTRICLE              +---------------+----------++  RV S prime:     14.50 cm/s   +---------------+----------++  TAPSE (M-mode): 3.4 cm       +---------------+----------++  +---------------+-------++-----------++  LEFT ATRIUM              Index         +---------------+-------++-----------++  LA diam:        4.50 cm  2.58 cm/m    +---------------+-------++-----------++  LA Vol (A2C):   51.7 ml  29.65 ml/m   +---------------+-------++-----------++  LA Vol (A4C):   52.1 ml  29.88 ml/m   +---------------+-------++-----------++  LA Biplane Vol: 53.9 ml  30.91 ml/m   +---------------+-------++-----------++ +------------+---------++----------++  RIGHT ATRIUM            Index        +------------+---------++----------++  RA Pressure: 3.00 mmHg               +------------+---------++----------++  RA Area:     8.21 cm                +------------+---------++----------++  RA Volume:   13.50 ml   7.74 ml/m   +------------+---------++----------++  +------------+-----------++  AORTIC VALVE               +------------+-----------++  LVOT Vmax:   109.00 cm/s   +------------+-----------++  LVOT Vmean:  77.700 cm/s   +------------+-----------++  LVOT VTI:    0.264 m        +------------+-----------++   +-------------+-------++  AORTA                   +-------------+-------++  Ao Root diam: 2.60 cm   +-------------+-------++  +--------------+----------++    +---------------+---------++  MITRAL VALVE                    TRICUSPID VALVE             +--------------+----------++    +---------------+---------++  MV Area (PHT): 4.15 cm         Estimated RAP:  3.00 mmHg   +--------------+----------++    +---------------+---------++  MV PHT:        53.07 msec   +--------------+----------++    +--------------+-------+  MV Decel Time: 183 msec         SHUNTS                  +--------------+----------++    +--------------+-------+ +----------------+-----------++  Systemic VTI:  0.26 m    MR Peak grad:    102.4 mmHg    +--------------+-------+ +----------------+-----------++  Systemic Diam: 2.00 cm   MR Mean grad:    65.0 mmHg     +--------------+-------+ +----------------+-----------++  MR Vmax:         506.00 cm/s   +----------------+-----------++  MR Vmean:        388.0 cm/s    +----------------+-----------++  MR PISA:         1.57 cm      +----------------+-----------++  MR PISA Eff ROA: 12 mm        +----------------+-----------++  MR PISA Radius:  0.50 cm       +----------------+-----------++ +--------------+-----------++  MV E velocity: 144.00 cm/s   +--------------+-----------++  MV A velocity: 107.00 cm/s   +--------------+-----------++  MV E/A ratio:  1.35          +--------------+-----------++    Glori Bickers MD Electronically signed by Glori Bickers MD Signature Date/Time: 10/31/2018/6:12:29 PM       TRANSESOPHOGEAL ECHO REPORT       Patient Name:   Hannah [name] Date of Exam: 12/30/2018 Medical Rec #:  [id number]         Height:       62.0 in Accession #:    [id number]        Weight:       165.3 lb Date of Birth:  [birth year]         BSA:          1.76 m Patient Age:    6 years          BP:           138/44  mmHg Patient Gender: F                 HR:           87 bpm. Exam Location:  Inpatient    Procedure: Transesophageal Echo, Cardiac Doppler, Color Doppler and 3D Echo  Indications:     Mitral regurgitaion   History:         Patient has prior history of Echocardiogram examinations, most                  recent 10/31/2018.   Sonographer:     Johny Chess Referring Phys:  [id number] Leanor Kail Diagnosing Phys: Kirk Ruths MD     PROCEDURE: Patients was under conscious sedation during this procedure. Anesthetic was administered intravenously by performing Physician: 68mg of Fentanyl, 2.044mof Versed. The transesophogeal probe was passed through the esophogus of the patient. The  patient developed no complications during the procedure.  IMPRESSIONS    1. The left ventricle has low normal systolic function, with an ejection fraction of 50-55%. No evidence of left ventricular regional wall motion abnormalities.  2. The right ventricle has normal systolc function. The cavity was normal.  3. Left atrial size was severely dilated.  4. No evidence of a thrombus present in the left atrial appendage.  5. Trivial pericardial effusion is present.  6. The mitral valve is abnormal. Mild thickening of the mitral valve leaflet. Mitral valve regurgitation is severe by color flow Doppler.  7. The aortic valve is tricuspid No stenosis of the aortic valve.  8. There is evidence of moderate plaque in the descending aorta.  9. Low normal LV systolic function; posterior MV leaflet appears to be restricted with severe MR; severe LAE.  FINDINGS  Left Ventricle: The left ventricle has low normal systolic function, with an ejection fraction of 50-55%. No evidence of left ventricular regional wall motion abnormalities.  Right Ventricle: The right ventricle has normal systolic function. The cavity was normal.  Left Atrium: Left atrial size was severely dilated.   Left Atrial  Appendage: No evidence of a thrombus present in the left atrial appendage.  Right Atrium: Right atrial size was normal in size.  Interatrial Septum: No atrial level shunt detected by color flow Doppler.  Pericardium: Trivial pericardial effusion is present.  Mitral Valve: The mitral valve is abnormal. Mild thickening of the mitral valve leaflet. Mitral valve regurgitation is severe by color flow Doppler.  Tricuspid Valve: The tricuspid valve was normal in structure. Tricuspid valve regurgitation is trivial by color flow Doppler.  Aortic Valve: The aortic valve is tricuspid Aortic valve regurgitation was not visualized by color flow Doppler. There is No stenosis of the aortic valve.  Pulmonic Valve: The pulmonic valve was normal in structure. Pulmonic valve regurgitation is trivial by color flow Doppler.  Aorta: There is evidence of moderate plaque in the descending aorta.  Additional Findings: Low normal LV systolic function; posterior MV leaflet appears to be restricted with severe MR; severe LAE.    +-------------+---------++  MITRAL VALVE              +-------------+---------++  MV Peak grad: 5.7 mmHg    +-------------+---------++  MV Mean grad: 3.0 mmHg    +-------------+---------++  MV Vmax:      1.19 m/s    +-------------+---------++  MV Vmean:     82.5 cm/s   +-------------+---------++  MV VTI:       0.29 m      +-------------+---------++ +----------------+-----------++  MR Peak grad:    142.1 mmHg    +----------------+-----------++  MR Mean grad:    78.0 mmHg     +----------------+-----------++  MR Vmax:         596.00 cm/s   +----------------+-----------++  MR Vmean:        402.0 cm/s    +----------------+-----------++  MR PISA:         3.08 cm      +----------------+-----------++  MR PISA Eff ROA: 20 mm        +----------------+-----------++  MR PISA Radius:  0.70 cm       +----------------+-----------++    Kirk Ruths MD Electronically  signed by Kirk Ruths MD Signature Date/Time: 12/30/2018/5:15:07 PM    RIGHT/LEFT HEART CATH AND CORONARY ANGIOGRAPHY  Conclusion    There is severe (3-4+) mitral regurgitation. Angiographically 3+, but based on MR related V wave would be 4+.  The left ventricular systolic function is normal. The left ventricular ejection fraction is 50-55% by visual estimate.  Hemodynamic findings consistent with moderate pulmonary hypertension and mitral valve regurgitation & LV end diastolic pressure is moderately elevated.  Angiographically normal coronary arteries   SUMMARY  Likely SEVERE MITRAL REGURGITATION with large V wave on PCWP waveform and PA waveform with at least 3-4+ MR on LV gram.  Moderate Secondary Pulmonary Hypertension related to elevated LVEDP and MR.  Angiographically normal coronary arteries  RECOMMENDATIONS  Patient likely requires additional diuresis, especially in light of planned PRBC  Closely monitor hemoglobin and renal function (minimal contrast use)  Would consider now TEE as part of formal evaluation for MR if there is consideration for possible Mitral Clip   Glenetta Hew, M.D., M.S. Interventional Cardiologist   Pager # [phone] Phone # [phone] [address] [place], [place] [address]    Recommendations  Antiplatelet/Anticoag No indication for antiplatelet therapy at this time .  Indications  Acute on chronic diastolic heart failure (HCC) [I50.33 (ICD-10-CM)]  Acute pulmonary edema (HCC) [J81.0 (ICD-10-CM)]  Nonrheumatic mitral valve regurgitation [I34.0 (ICD-10-CM)]  Procedural Details  Technical Details Primary Care Provider/Attending Physician: [name], MD / Dr. McDiarmid Primary-Referring cardiologist: Jenkins Rouge, MD   Hannah [name] is a 73 y.o. female with a hx of chronic dCHF, mitral regurgitation,  HTN, HLD, h/o GIB requiring  transfusion, chronic iron deficiency anemia, asthma, GERD,  anxiety and dementia, who was seen in consultation by Dr. Johnsie Cancel yesterday for the evaluation of a/c dCHF thought to be potentially related to mild regurgitation.  Dr. Johnsie Cancel was concerned about the severity of much regurgitation, and is recommended right left heart catheterization for evaluation of her PA pressures and PCWP/LV gram assessment of mitral regurgitation.  Time Out: Verified patient identification, verified procedure, site/side was marked, verified correct patient position, special equipment/implants available, medications/allergies/relevent history reviewed, required imaging and test results available. Performed.  Access:  RIGHT radial Artery: 6 Fr sheath -- Seldinger technique using Micropuncture Kit --  Direct ultrasound guidance used.  Permanent image obtained and placed on chart. -- 10 mL radial cocktail IA; 4000 units IV Heparin  * Right Brachial/Antecubital Vein: And 18-gauge IV was placed by the Cath Lab RN in the Cath Lab.  This was exchanged over a wire for a 5Fr sheath  Right Heart Catheterization: 5 Fr Gordy Councilman catheter advanced under fluoroscopy with balloon inflated to the RA, RV, then PCWP-PA for hemodynamic measurement. An angled Glidewire was used to advanced the catheter from the innominate vein into the pulmonary artery. * Simultaneous FA & PA blood gases checked for SaO2% to calculate FICK CO/CI  * Sequential PCWP/LV & RV/LV pressures monitored followed by TIG 4.0 catheter in LV.  * Catheter removed completely out of the body with balloon deflated.  Left Heart Catheterization: 5 Fr Catheters advanced or exchanged over a J-wire under direct fluoroscopic guidance into the ascending aorta; TIG 4.0 catheter advanced first.  * LV Hemodynamics (LV Gram): TIG 4.0 catheter and Angled Pigtail Catheter * Left & Right Coronary Artery Cineangiography: TIG 4.0 catheter   Upon completion of Angiogaphy, the catheter was removed completely out of the body over a wire, without  complication.  Femoral / Brachial Sheath(s) removed in the Cath Lab with manual pressure for hemostasis.   Radial sheath removed in the Cardiac Catheterization lab with TR Band placed for hemostasis.  TR Band: 1215  Hours; 12 mL air  MEDICATIONS * SQ Lidocaine 74m * Radial Cocktail: 3 mg Verapmil in 10 mL NS * Isovue Contrast: 50 mL * Heparin: 4000 Units  Fluoro time: 5.6 minutes. Dose Area Product: 78206mGycm2. Cumulative Air Kerma: 127 mGy.  Estimated blood loss: 15 mL.   During this procedure no sedation was administered.  Medications (Filter: Administrations occurring from 12/29/18 1115 to 12/29/18 1225) (important)  Continuous medications are totaled by the amount administered until 12/29/18 1225.  Medication Rate/Dose/Volume Action  Date Time   lidocaine (PF) (XYLOCAINE) 1 % injection (mL) 2 mL Given 12/29/18 1144   Total dose as of 12/29/18 1225 2 mL Given 1147   4 mL        Radial Cocktail/Verapamil only (mL) 10 mL Given 12/29/18 1149   Total dose as of 12/29/18 1225        10 mL        heparin injection (Units) 4,000 Units Given 12/29/18 1159   Total dose as of 12/29/18 1225        4,000 Units        Heparin (Porcine) in NaCl 1000-0.9 UT/500ML-% SOLN (mL) 500 mL Given 12/29/18 1215   Total dose as of 12/29/18 1225 500 mL Given 1215   1,000 mL        iohexol (OMNIPAQUE) 350 MG/ML injection (mL) 50 mL Given 12/29/18 1216   Total dose as of 12/29/18 1225  50 mL        0.9 % sodium chloride infusion (Manually program via Guardrails IV Fluids) *Not included in total MAR Hold 12/29/18 1120   Dosing weight:  76.3 kg        Total dose as of 12/29/18 1225        Cannot be calculated        atorvastatin (LIPITOR) tablet 40 mg (mg) *Not included in total MAR Hold 12/29/18 1120   Total dose as of 12/29/18 1225        Cannot be calculated        busPIRone (BUSPAR) tablet 5 mg (mg) *Not included in total MAR Hold 12/29/18 1120   Total dose as of 12/29/18 1225         Cannot be calculated        ferrous sulfate tablet 325 mg (mg) *Not included in total MAR Hold 12/29/18 1120   Total dose as of 12/29/18 1225        Cannot be calculated        furosemide (LASIX) injection 40 mg (mg) *Not included in total MAR Hold 12/29/18 1120   Total dose as of 12/29/18 1225 *Not included in total MAR Unhold 1123   Cannot be calculated        metoprolol tartrate (LOPRESSOR) tablet 12.5 mg (mg) *Not included in total MAR Hold 12/29/18 1120   Total dose as of 12/29/18 1225        Cannot be calculated        nystatin cream (MYCOSTATIN) *Not included in total MAR Hold 12/29/18 1120   Total dose as of 12/29/18 1225        Cannot be calculated        pantoprazole (PROTONIX) EC tablet 40 mg (mg) *Not included in total MAR Hold 12/29/18 1120   Total dose as of 12/29/18 1225        Cannot be calculated        polyethylene glycol (MIRALAX / GLYCOLAX) packet 17 g (g) *Not included in total MAR Hold 12/29/18 1120   Total dose as of 12/29/18 1225        Cannot be calculated        sodium chloride flush (NS) 0.9 % injection 3 mL (mL) *Not included in total MAR Hold 12/29/18 1120   Total dose as of 12/29/18 1225        Cannot be calculated        traZODone (DESYREL) tablet 25 mg (mg) *Not included in total MAR Hold 12/29/18 1120   Total dose as of 12/29/18 1225        Cannot be calculated        vitamin B-12 (CYANOCOBALAMIN) tablet 1,000 mcg (mcg) *Not included in total MAR Hold 12/29/18 1120   Total dose as of 12/29/18 1225        Cannot be calculated        Contrast  Medication Name Total Dose  iohexol (OMNIPAQUE) 350 MG/ML injection 50 mL    Radiation/Fluoro  Fluoro time: 5.6 (min) DAP: 7577 (mGycm2) Cumulative Air Kerma: 462 (mGy)  Complications  Complications documented before study signed (12/29/2018 70:35 PM)   No complications were associated with this study.  Documented by Leonie Man, MD - 12/29/2018 12:29 PM    Coronary  Findings  Diagnostic Dominance: Right Left Main  Vessel was injected. Vessel is normal in caliber. Vessel is angiographically normal.  Left Anterior Descending  Vessel was injected. Vessel  is normal in caliber. Large-caliber Vessel is angiographically normal.  First Diagonal Branch  Vessel was injected. Vessel is normal in size. Vessel is moderate in size Vessel is angiographically normal.  First Septal Branch  Vessel was injected. Vessel is small in size. Vessel is angiographically normal.  Second Diagonal Branch  Vessel was injected. Vessel is small in size. Vessel is angiographically normal.  Second Septal Branch  Vessel was injected. Vessel is small in size. Vessel is angiographically normal.  Ramus Intermedius  Vessel was injected. Vessel is large. Vessel is angiographically normal.  Left Circumflex  Vessel was injected. Vessel is normal in caliber. Vessel is angiographically normal.  Left Posterior Atrioventricular Artery  Vessel was injected. Vessel is small in size. Vessel is angiographically normal.  Right Coronary Artery  Vessel was injected. Vessel is normal in caliber. Vessel is angiographically normal.  Acute Marginal Branch  Vessel was injected. Vessel is small in size. Vessel is angiographically normal.  Right Ventricular Branch  Vessel was injected. Vessel is small in size. Vessel is angiographically normal.  Right Posterior Descending Artery  Vessel was injected. Vessel is normal in size. Vessel is moderate in size Vessel is angiographically normal.  Right Posterior Atrioventricular Artery  Vessel was injected. Vessel is small in size. Vessel is angiographically normal.  First Right Posterolateral Branch  Vessel was injected. Vessel is small in size. Vessel is angiographically normal.  Second Right Posterolateral Branch  Vessel was injected. Vessel is small in size. Vessel is angiographically normal.  Intervention  No interventions have been documented. Right  Heart  Right Heart Pressures Hemodynamic findings consistent with moderate pulmonary hypertension and mitral valve regurgitation. PA P-mean: 51/23 mmHg, 37 mmHg PCWP: 25-45 mmHg (large V wave)-29 mmHg Elevated LV EDP consistent with volume overload. LV P-EDP: 110/19 mmHg - 24 mmHg Aortic pressure-MAP: 105/49 mmHg - 72 mmHg Ao sat 98%, PA sat 61%. CARDIAC OUTPUT-INDEX (Fick): 6.6-3.72  Right Atrium The right atrium is moderately dilated.  Right atrial pressure is elevated. RAP 18 mmHg  Right Ventricle The right ventricle is mildly dilated. RVP-EDP: 53/15 mmHg - 19 mmHg  Wall Motion  Resting       Cannot rule out some inferoseptal hypokinesis, could be from bundle branch block          Left Heart  Left Ventricle The left ventricle is mildly dilated. The left ventricular systolic function is normal. LV end diastolic pressure is moderately elevated. The left ventricular ejection fraction is 50-55% by visual estimate. There is severe (4+) mitral regurgitation. Angiographically 3+, but with huge V wave, more likely 4+  Aortic Valve There is no aortic valve stenosis. There is normal aortic valve motion.  Coronary Diagrams  Diagnostic Dominance: Right  Intervention  Implants   No implant documentation for this case.  Syngo Images  Show images for CARDIAC CATHETERIZATION  Images on Long Term Storage  Show images for Kateryn, [place] to Procedure Log  Procedure Log    Hemo Data   Most Recent Value  Fick Cardiac Output 6.6 L/min  Fick Cardiac Output Index 3.72 (L/min)/BSA  RA A Wave 20 mmHg  RA V Wave 17 mmHg  RA Mean 18 mmHg  RV Systolic Pressure 53 mmHg  RV Diastolic Pressure 15 mmHg  RV EDP 19 mmHg  PA Systolic Pressure 51 mmHg  PA Diastolic Pressure 23 mmHg  PA Mean 37 mmHg  PW A Wave 25 mmHg  PW V Wave 45 mmHg  PW Mean 29 mmHg  AO Systolic Pressure 153 mmHg  AO Diastolic Pressure 52 mmHg  AO Mean 72 mmHg  LV Systolic Pressure 794 mmHg  LV Diastolic  Pressure 19 mmHg  LV EDP 23 mmHg  AOp Systolic Pressure 327 mmHg  AOp Diastolic Pressure 49 mmHg  AOp Mean Pressure 72 mmHg  LVp Systolic Pressure 614 mmHg  LVp Diastolic Pressure 19 mmHg  LVp EDP Pressure 24 mmHg  QP/QS 1  TPVR Index 9.94 HRUI  TSVR Index 19.34 HRUI  PVR SVR Ratio 0.15  TPVR/TSVR Ratio 0.51      Impression:  Patient has stage D severe symptomatic mitral regurgitation.  She presents with symptoms of shortness of breath with low-level activity and occasionally at rest, PND and orthopnea all consistent with chronic combined systolic and diastolic congestive heart failure New York Heart Association functional class III and multiple recent hospital admissions for acute exacerbations of class IV symptoms with pulmonary edema on chest x-ray.    I have personally reviewed the patient's recent transesophageal echocardiogram and diagnostic cardiac catheterization.  TEE reveals low normal left ventricular systolic function with ejection fraction estimated 55% in the setting of severe mitral regurgitation.  The left ventricle is slightly enlarged.  The mitral annulus is dilated.  There is mild thickening of both the anterior and posterior leaflets but overall both leaflets move fairly well.  There is some calcification in the posterior mitral leaflet and subvalvular apparatus with likely significant restriction of the posterior leaflet.  There is severe mitral regurgitation resulting from a combination of both type I and type IIIa mitral valve dysfunction.    I agree the patient would benefit from mitral valve repair.  Under the circumstances her pathophysiology may represent a combination of mixed primary and secondary mitral regurgitation, but the patient has clearly failed optimal medical therapy over the past 2 months.  However, I would be reluctant to consider this patient a candidate for conventional surgical mitral valve repair because of her age and comorbid medical problems,  most notably her significant underlying dementia.  TEE reveals anatomical characteristics that may be suitable for transcatheter edge-to-edge repair.     Plan:  The patient and her family (2 daughters and 1 son) were counseled at length regarding treatment alternatives for management of severe symptomatic mitral regurgitation.  Alternative approaches such as conventional surgical mitral valve repair or replacement with or without minimally-invasive techniques, percutaneous edge-to-edge Mitraclip repair, and continued medical therapy without intervention were compared and contrasted at length.  The risks associated with conventional surgery were discussed in detail, as were expectations for post-operative convalescence, and why I would be reluctant to consider this patient a candidate for conventional surgery.  Issues specific to Mitraclip repair were discussed including questions about long term freedom from persistent or recurrent mitral regurgitation, the potential for device migration or embolization, and other technical complications related to the procedure itself.  Current availability of other catheter-based treatments for mitral regurgitation was discussed.  Long-term prognosis with medical therapy was discussed. This discussion was placed in the context of the patient's own specific clinical presentation and past medical history.  All of their questions have been addressed.  The patient is interested in proceeding with transcatheter edge-to-edge repair in the near future.    I spent in excess of 90 minutes during the conduct of this office consultation and >50% of this time involved direct face-to-face encounter with the patient for counseling and/or coordination of their care.      Valentina Gu. Roxy Manns, MD 01/16/2019  11:51 AM

## 2019-01-17 ENCOUNTER — Observation Stay (HOSPITAL_COMMUNITY)
Admission: EM | Admit: 2019-01-17 | Discharge: 2019-01-18 | Disposition: A | Discharge status: Home / Self Care | Payer: Medicare Other | Attending: Family Medicine | Admitting: Family Medicine

## 2019-01-17 ENCOUNTER — Encounter: Payer: Self-pay | Admitting: Thoracic Surgery (Cardiothoracic Vascular Surgery)

## 2019-01-17 DIAGNOSIS — I130 Hypertensive heart and chronic kidney disease with heart failure and stage 1 through stage 4 chronic kidney disease, or unspecified chronic kidney disease: Secondary | ICD-10-CM | POA: Insufficient documentation

## 2019-01-17 DIAGNOSIS — E7800 Pure hypercholesterolemia, unspecified: Secondary | ICD-10-CM | POA: Diagnosis not present

## 2019-01-17 DIAGNOSIS — N184 Chronic kidney disease, stage 4 (severe): Secondary | ICD-10-CM | POA: Diagnosis not present

## 2019-01-17 DIAGNOSIS — E785 Hyperlipidemia, unspecified: Secondary | ICD-10-CM | POA: Diagnosis not present

## 2019-01-17 DIAGNOSIS — M1990 Unspecified osteoarthritis, unspecified site: Secondary | ICD-10-CM | POA: Insufficient documentation

## 2019-01-17 DIAGNOSIS — Z20828 Contact with and (suspected) exposure to other viral communicable diseases: Secondary | ICD-10-CM | POA: Insufficient documentation

## 2019-01-17 DIAGNOSIS — F0280 Dementia in other diseases classified elsewhere without behavioral disturbance: Secondary | ICD-10-CM | POA: Diagnosis not present

## 2019-01-17 DIAGNOSIS — I447 Left bundle-branch block, unspecified: Secondary | ICD-10-CM | POA: Diagnosis not present

## 2019-01-17 DIAGNOSIS — R062 Wheezing: Secondary | ICD-10-CM | POA: Diagnosis not present

## 2019-01-17 DIAGNOSIS — I5032 Chronic diastolic (congestive) heart failure: Secondary | ICD-10-CM | POA: Insufficient documentation

## 2019-01-17 DIAGNOSIS — R0609 Other forms of dyspnea: Secondary | ICD-10-CM | POA: Diagnosis not present

## 2019-01-17 DIAGNOSIS — J810 Acute pulmonary edema: Secondary | ICD-10-CM

## 2019-01-17 DIAGNOSIS — R0602 Shortness of breath: Secondary | ICD-10-CM | POA: Diagnosis not present

## 2019-01-17 DIAGNOSIS — Z79899 Other long term (current) drug therapy: Secondary | ICD-10-CM | POA: Diagnosis not present

## 2019-01-17 DIAGNOSIS — K219 Gastro-esophageal reflux disease without esophagitis: Secondary | ICD-10-CM | POA: Diagnosis not present

## 2019-01-17 DIAGNOSIS — I340 Nonrheumatic mitral (valve) insufficiency: Secondary | ICD-10-CM | POA: Insufficient documentation

## 2019-01-17 DIAGNOSIS — I509 Heart failure, unspecified: Secondary | ICD-10-CM

## 2019-01-17 DIAGNOSIS — R0689 Other abnormalities of breathing: Secondary | ICD-10-CM | POA: Diagnosis not present

## 2019-01-17 DIAGNOSIS — Z87891 Personal history of nicotine dependence: Secondary | ICD-10-CM | POA: Insufficient documentation

## 2019-01-17 DIAGNOSIS — F419 Anxiety disorder, unspecified: Secondary | ICD-10-CM | POA: Diagnosis not present

## 2019-01-17 DIAGNOSIS — D649 Anemia, unspecified: Secondary | ICD-10-CM | POA: Insufficient documentation

## 2019-01-17 DIAGNOSIS — J9621 Acute and chronic respiratory failure with hypoxia: Principal | ICD-10-CM | POA: Insufficient documentation

## 2019-01-17 DIAGNOSIS — R000 Tachycardia, unspecified: Secondary | ICD-10-CM | POA: Diagnosis not present

## 2019-01-17 DIAGNOSIS — N179 Acute kidney failure, unspecified: Secondary | ICD-10-CM | POA: Insufficient documentation

## 2019-01-17 DIAGNOSIS — R0600 Dyspnea, unspecified: Secondary | ICD-10-CM | POA: Diagnosis present

## 2019-01-17 NOTE — Addendum Note (Signed)
Addended by: Barkley Boards on: 01/17/2019 05:02 PM   Modules accepted: Miquel Dunn

## 2019-01-17 NOTE — ED Triage Notes (Signed)
Came in via ems; c/o shortness of breathe. Pt endorses severe anxiety as well. Per EMS pt is given versed 2.5 mg; along w/ Solumedrol 125 mg.

## 2019-01-18 ENCOUNTER — Emergency Department (HOSPITAL_COMMUNITY): Payer: Medicare Other

## 2019-01-18 ENCOUNTER — Other Ambulatory Visit: Payer: Self-pay

## 2019-01-18 DIAGNOSIS — J810 Acute pulmonary edema: Secondary | ICD-10-CM | POA: Diagnosis not present

## 2019-01-18 DIAGNOSIS — N179 Acute kidney failure, unspecified: Secondary | ICD-10-CM | POA: Diagnosis not present

## 2019-01-18 DIAGNOSIS — R0600 Dyspnea, unspecified: Secondary | ICD-10-CM | POA: Diagnosis present

## 2019-01-18 DIAGNOSIS — D649 Anemia, unspecified: Secondary | ICD-10-CM | POA: Diagnosis not present

## 2019-01-18 DIAGNOSIS — I509 Heart failure, unspecified: Secondary | ICD-10-CM

## 2019-01-18 DIAGNOSIS — I340 Nonrheumatic mitral (valve) insufficiency: Secondary | ICD-10-CM

## 2019-01-18 DIAGNOSIS — R0602 Shortness of breath: Secondary | ICD-10-CM | POA: Diagnosis not present

## 2019-01-18 LAB — BASIC METABOLIC PANEL
Anion gap: 13 (ref 5–15)
Anion gap: 19 — ABNORMAL HIGH (ref 5–15)
BUN: 31 mg/dL — ABNORMAL HIGH (ref 8–23)
BUN: 33 mg/dL — ABNORMAL HIGH (ref 8–23)
CO2: 26 mmol/L (ref 22–32)
CO2: 29 mmol/L (ref 22–32)
Calcium: 8.4 mg/dL — ABNORMAL LOW (ref 8.9–10.3)
Calcium: 8.9 mg/dL (ref 8.9–10.3)
Chloride: 95 mmol/L — ABNORMAL LOW (ref 98–111)
Chloride: 97 mmol/L — ABNORMAL LOW (ref 98–111)
Creatinine, Ser: 1.73 mg/dL — ABNORMAL HIGH (ref 0.44–1.00)
Creatinine, Ser: 1.78 mg/dL — ABNORMAL HIGH (ref 0.44–1.00)
GFR calc Af Amer: 32 mL/min — ABNORMAL LOW (ref >60)
GFR calc Af Amer: 33 mL/min — ABNORMAL LOW (ref >60)
GFR calc non Af Amer: 27 mL/min — ABNORMAL LOW (ref >60)
GFR calc non Af Amer: 28 mL/min — ABNORMAL LOW (ref >60)
Glucose, Bld: 195 mg/dL — ABNORMAL HIGH (ref 70–99)
Glucose, Bld: 240 mg/dL — ABNORMAL HIGH (ref 70–99)
Potassium: 3.4 mmol/L — ABNORMAL LOW (ref 3.5–5.1)
Potassium: 3.6 mmol/L (ref 3.5–5.1)
Sodium: 139 mmol/L (ref 135–145)
Sodium: 140 mmol/L (ref 135–145)

## 2019-01-18 LAB — CBC WITH DIFFERENTIAL/PLATELET
Abs Immature Granulocytes: 0.04 10*3/uL (ref 0.00–0.07)
Basophils Absolute: 0.1 10*3/uL (ref 0.0–0.1)
Basophils Relative: 1 %
Eosinophils Absolute: 0 10*3/uL (ref 0.0–0.5)
Eosinophils Relative: 0 %
HCT: 28.5 % — ABNORMAL LOW (ref 36.0–46.0)
Hemoglobin: 8.9 g/dL — ABNORMAL LOW (ref 12.0–15.0)
Immature Granulocytes: 0 %
Lymphocytes Relative: 17 %
Lymphs Abs: 1.6 10*3/uL (ref 0.7–4.0)
MCH: 30.6 pg (ref 26.0–34.0)
MCHC: 31.2 g/dL (ref 30.0–36.0)
MCV: 97.9 fL (ref 80.0–100.0)
Monocytes Absolute: 0.7 10*3/uL (ref 0.1–1.0)
Monocytes Relative: 7 %
Neutro Abs: 7.2 10*3/uL (ref 1.7–7.7)
Neutrophils Relative %: 75 %
Platelets: 303 10*3/uL (ref 150–400)
RBC: 2.91 MIL/uL — ABNORMAL LOW (ref 3.87–5.11)
RDW: 17 % — ABNORMAL HIGH (ref 11.5–15.5)
WBC: 9.5 10*3/uL (ref 4.0–10.5)
nRBC: 0 % (ref 0.0–0.2)

## 2019-01-18 LAB — BRAIN NATRIURETIC PEPTIDE: B Natriuretic Peptide: 297.9 pg/mL — ABNORMAL HIGH (ref 0.0–100.0)

## 2019-01-18 LAB — SARS CORONAVIRUS 2 (TAT 6-24 HRS): SARS Coronavirus 2: NEGATIVE

## 2019-01-18 MED ORDER — METOPROLOL TARTRATE 25 MG PO TABS
12.5000 mg | ORAL_TABLET | Freq: Two times a day (BID) | ORAL | Status: DC
  Administered 2019-01-18: 12.5 mg via ORAL
  Filled 2019-01-18: qty 1

## 2019-01-18 MED ORDER — POTASSIUM CHLORIDE CRYS ER 20 MEQ PO TBCR: 40.0000 meq | EXTENDED_RELEASE_TABLET | Freq: Once | ORAL | Status: DC

## 2019-01-18 MED ORDER — FUROSEMIDE 40 MG PO TABS: 60.0000 mg | ORAL_TABLET | Freq: Two times a day (BID) | ORAL | 0 refills | Status: DC

## 2019-01-18 MED ORDER — TRAZODONE HCL 50 MG PO TABS: 25.0000 mg | ORAL_TABLET | Freq: Every evening | ORAL | Status: DC | PRN

## 2019-01-18 MED ORDER — SODIUM CHLORIDE 0.9% FLUSH: 3.0000 mL | INTRAVENOUS | Status: DC | PRN

## 2019-01-18 MED ORDER — SODIUM CHLORIDE 0.9 % IV SOLN
510.0000 mg | Freq: Once | INTRAVENOUS | Status: AC
  Administered 2019-01-18: 12:00:00 510 mg via INTRAVENOUS
  Filled 2019-01-18: qty 17

## 2019-01-18 MED ORDER — VITAMIN B-12 1000 MCG PO TABS
1000.0000 ug | ORAL_TABLET | Freq: Every day | ORAL | Status: DC
  Administered 2019-01-18: 1000 ug via ORAL
  Filled 2019-01-18: qty 1

## 2019-01-18 MED ORDER — PANTOPRAZOLE SODIUM 40 MG PO TBEC
40.0000 mg | DELAYED_RELEASE_TABLET | Freq: Every day | ORAL | Status: DC
  Administered 2019-01-18: 40 mg via ORAL
  Filled 2019-01-18: qty 1

## 2019-01-18 MED ORDER — ENOXAPARIN SODIUM 30 MG/0.3ML ~~LOC~~ SOLN: 30.0000 mg | Freq: Every day | SUBCUTANEOUS | Status: DC

## 2019-01-18 MED ORDER — ONDANSETRON HCL 4 MG/2ML IJ SOLN: 4.0000 mg | Freq: Four times a day (QID) | INTRAMUSCULAR | Status: DC | PRN

## 2019-01-18 MED ORDER — FUROSEMIDE 10 MG/ML IJ SOLN
40.0000 mg | Freq: Once | INTRAMUSCULAR | Status: AC
  Administered 2019-01-18: 01:00:00 40 mg via INTRAVENOUS
  Filled 2019-01-18: qty 4

## 2019-01-18 MED ORDER — SODIUM CHLORIDE 0.9% FLUSH
3.0000 mL | Freq: Two times a day (BID) | INTRAVENOUS | Status: DC
  Administered 2019-01-18 (×2): 3 mL via INTRAVENOUS

## 2019-01-18 MED ORDER — SODIUM CHLORIDE 0.9 % IV SOLN: 250.0000 mL | INTRAVENOUS | Status: DC | PRN

## 2019-01-18 MED ORDER — HEPARIN SODIUM (PORCINE) 5000 UNIT/ML IJ SOLN: 5000.0000 [IU] | Freq: Three times a day (TID) | INTRAMUSCULAR | Status: DC

## 2019-01-18 MED ORDER — ACETAMINOPHEN 325 MG PO TABS: 650.0000 mg | ORAL_TABLET | ORAL | Status: DC | PRN

## 2019-01-18 MED ORDER — ALBUTEROL SULFATE (2.5 MG/3ML) 0.083% IN NEBU: 5.0000 mg | INHALATION_SOLUTION | Freq: Once | RESPIRATORY_TRACT | Status: DC

## 2019-01-18 MED ORDER — FERROUS SULFATE 325 (65 FE) MG PO TABS
325.0000 mg | ORAL_TABLET | Freq: Every day | ORAL | Status: DC
  Administered 2019-01-18: 325 mg via ORAL
  Filled 2019-01-18: qty 1

## 2019-01-18 MED ORDER — BUSPIRONE HCL 10 MG PO TABS: 5.0000 mg | ORAL_TABLET | Freq: Two times a day (BID) | ORAL | Status: DC | PRN

## 2019-01-18 MED ORDER — FUROSEMIDE 10 MG/ML IJ SOLN
40.0000 mg | Freq: Once | INTRAMUSCULAR | Status: AC
  Administered 2019-01-18: 40 mg via INTRAVENOUS
  Filled 2019-01-18: qty 4

## 2019-01-18 NOTE — ED Notes (Signed)
Gave pt sandwich and drink  

## 2019-01-18 NOTE — ED Notes (Signed)
Patient verbalizes understanding of discharge instructions. Opportunity for questioning and answers were provided. Armband removed by staff, pt discharged from ED.  

## 2019-01-18 NOTE — Discharge Summary (Addendum)
[facility] Discharge Summary  [name] Medical record number: [id number] Date of birth: 10/27/1945 Age: 73 y.o. Gender: female Date of Admission: 01/17/2019  Date of Discharge: 01/18/19  Admitting Physician: Dr. Guadalupe Dawn  Primary Care Provider: [name], MD Consultants: none  Indication for Hospitalization: dyspnea and hypoxia  Discharge Diagnoses/Problem List:  Dyspnea 2/2 volume overload HFpEF 2/2 MVR CKD stage IV Anemia Hypertension Hyperlipidemia Anxiety Dementia OA  Disposition: Home  Discharge Condition: Stable  Discharge Exam:  General: NAD, pleasant, sitting up in bed and able to speak in full sentences Cardiovascular: RRR, no m/r/g, no LE edema Respiratory: CTA BL, normal work of breathing MSK: moves 4 extremities equally Derm: no rashes appreciated Neuro: CN II-XII grossly intact Psych: AOx3, appropriate affect  Brief Hospital Course:  ARLEEN [name] is a 73 y.o. female  who presented with shortness of breath. BNP elevated although at baseline and chest x-ray consistent with volume overload.  Patient has known mitral valve regurgitation which was deemed to be cause of shortness of breath.  Patient received IV Lasix and dyspnea improved. Patient discharged on increased dose of Lasix to 60 mg twice daily.  Patient with CKD stage III however creatinine improved after diuresis while admitted.  Patient should follow-up with cardiology outpatient. Cardiology curb-sided and did not believe that patient needed to be seen while admitted given that she was not far from her baseline. They plan to proceed with valve repair around mid October.  Patient also received dose of IV Feraheme prior to discharge given previous low iron studies.  Issues for Follow Up:  Ensure patient has close follow-up with cardiology for mitral valve repair. Monitor fluid status closely with increased dose of Lasix at 60 mg  twice daily, also monitor renal function. Follow-up treatment for patient's anxiety as this is likely large contributor to multiple admissions.  Consider starting patient on SSRI for long-term management of anxiety.  Significant Procedures: None  Significant Labs and Imaging:  Recent Labs  Lab 01/17/19 2359  WBC 9.5  HGB 8.9*  HCT 28.5*  PLT 303   Recent Labs  Lab 01/17/19 2359 01/18/19 1130  NA 139 140  K 3.6 3.4*  CL 97* 95*  CO2 29 26  GLUCOSE 195* 240*  BUN 31* 33*  CREATININE 1.78* 1.73*  CALCIUM 8.4* 8.9    Results/Tests Pending at Time of Discharge: None  Discharge Medications:  Allergies as of 01/18/2019   No Known Allergies      Medication List     TAKE these medications    acetaminophen 325 MG tablet Commonly known as: TYLENOL Take 650 mg by mouth every 6 (six) hours as needed for mild pain or headache.   Adult One Daily Gummies Chew Chew 1 tablet by mouth daily.   atorvastatin 40 MG tablet Commonly known as: LIPITOR Take 1 tablet (40 mg total) by mouth daily.   cyanocobalamin 1000 MCG tablet Take 1 tablet (1,000 mcg total) by mouth daily.   ferrous sulfate 324 MG Tbec Take 324 mg by mouth.   furosemide 40 MG tablet Commonly known as: LASIX Take 1.5 tablets (60 mg total) by mouth 2 (two) times daily. What changed: how much to take   metoprolol tartrate 25 MG tablet Commonly known as: LOPRESSOR TAKE 1/2 TABLET BY MOUTH 2 TIMES DAILY What changed: See the new instructions.   omeprazole 20 MG capsule Commonly known as: PRILOSEC TAKE 1 CAPSULE BY MOUTH DAILY AS NEEDED What  changed:  how much to take how to take this when to take this additional instructions   traZODone 50 MG tablet Commonly known as: DESYREL Take 0.5 tablets (25 mg total) by mouth at bedtime as needed for sleep.        Discharge Instructions: Please refer to Patient Instructions section of EMR for full details.  Patient was counseled important signs and  symptoms that should prompt return to medical care, changes in medications, dietary instructions, activity restrictions, and follow up appointments.   Follow-Up Appointments: Follow-up Information     [name], MD. Go on 01/24/2019.   Specialty: Family Medicine Why: Your appointment is scheduled for 10:10 AM.  Please arrive 15 minutes early. Contact information: [place] [address] [phone]         [name], MD .   Specialty: Cardiology Contact information: [id number] [place] [address] [phone]           Emmeline Winebarger, [place], [place] 01/18/2019, 3:27 PM PGY-3, [place]

## 2019-01-18 NOTE — Discharge Instructions (Signed)
Thank you for allowing [place] to participate in your care!    You were admitted for shortness of breath.  We have given you IV Lasix in order to remove fluid.  We would like for you to increase your home Lasix, furosemide dosage to 60 mg twice daily.  You have follow-up with Dr. Erin Hearing on 01/24/2019 at 10:10 AM.  Please arrive 15 minutes early.  You will also need to continue to follow-up with cardiology in order to discuss further the mitral valve.   If you experience worsening of your admission symptoms, develop shortness of breath, life threatening emergency, suicidal or homicidal thoughts you must seek medical attention immediately by calling 911 or calling your MD immediately  if symptoms less severe.

## 2019-01-18 NOTE — ED Notes (Signed)
Lunch Tray Ordered @ 1038.  

## 2019-01-18 NOTE — ED Notes (Signed)
Pt continuing to get up out of the bed and stating that she wants to go home. Informed multiple times that the doctor recommended that she be admitted. Pt having periods of confusion. Placed back on monitor in bed securely

## 2019-01-18 NOTE — Care Management CC44 (Signed)
Condition Code 44 Documentation Completed  Patient Details  Name: TARSHIA KOT MRN: [id number] Date of Birth: [birth year]   Condition Code 44 given:  Yes Patient signature on Condition Code 44 notice:  Yes Documentation of 2 MD's agreement:  Yes Code 44 added to claim:  Yes    Fuller Mandril, RN 01/18/2019, 4:19 PM

## 2019-01-18 NOTE — ED Notes (Signed)
Breakfast Ordered 

## 2019-01-18 NOTE — Care Management CC44 (Signed)
Condition Code 44 Documentation Completed  Patient Details  Name: LEVANA MINETTI MRN: [id number] Date of Birth: 1945/05/27   Condition Code 44 given:   yes Patient signature on Condition Code 44 notice:   yes Documentation of 2 MD's agreement:   yes Code 44 added to claim:   yes    Fuller Mandril, RN 01/18/2019, 4:18 PM

## 2019-01-18 NOTE — ED Notes (Signed)
Pt walked while on pulse ox, pt dropped from 96 to 90 after about 30 feet and pt also became short of breath

## 2019-01-18 NOTE — ED Notes (Addendum)
Pt got out of bed, wandering room. Pt found 18 gauge IV. Pt had taken out of packaging. IV removed from pt possession. Pt assessed, pt had not done anything with needle, was just holding it. Needle and catheter disposed of. Room inspected for any other sharps.

## 2019-01-18 NOTE — ED Provider Notes (Addendum)
TIME SEEN: 12:46 AM  CHIEF COMPLAINT: Shortness of breath  HPI: Patient is a 73 year old female with history of dementia, CHF, anxiety, hypertension, hyperlipidemia who presents to the emergency department with shortness of breath that started tonight.  Patient's daughter called 911.  Unclear patient was hypoxic with EMS.  Does not wear oxygen chronically.  Denies chest pain or chest discomfort.  No fevers but has had nonproductive cough.  No lower extremity swelling or pain.  Is on Lasix 40 mg twice daily.  Given 125 mg of IV Solu-Medrol with EMS and 2.5 mg of IV Versed.  No history of asthma or COPD.  ROS: See HPI Constitutional: no fever  Eyes: no drainage  ENT: no runny nose   Cardiovascular:  no chest pain  Resp:  SOB  GI: no vomiting GU: no dysuria Integumentary: no rash  Allergy: no hives  Musculoskeletal: no leg swelling  Neurological: no slurred speech ROS otherwise negative  PAST MEDICAL HISTORY/PAST SURGICAL HISTORY:  Past Medical History:  Diagnosis Date  . Anemia   . Anxiety   . Anxiety state 08/09/2008   Spoke with Daughter came up with following plan  If patient has episode of shortness of breath or chest pain or distress they will check her pulse ox  If < 85 or she seems to lose cx they will cal 911  Other wise will treat as panic attack and give buspar up to 2 caps four times a day and try to calm and redirect her.     . Arthritis    "hands" (05/09/2015)  . CHF (congestive heart failure) ([place])   . Elevated TSH 02/03/2018  . GERD (gastroesophageal reflux disease)   . High cholesterol   . History of blood transfusion 1960s; 05/08/2015   "when I had my kids; low HgB"  . History of iron deficiency anemia 05/13/2015  . Hypertension   . Left bundle branch block (LBBB) determined by electrocardiography 12/29/2018  . Osteoarthritis   . Pneumonia 05/2004  . Renal insufficiency 11/09/2017  . 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 SOB (shortness of breath) 05/08/2015  . Vitamin B12  deficiency 12/28/2018    MEDICATIONS:  Prior to Admission medications   Medication Sig Start Date End Date Taking? Authorizing Provider  acetaminophen (TYLENOL) 325 MG tablet Take 650 mg by mouth every 6 (six) hours as needed for mild pain or headache.    [provider]  atorvastatin (LIPITOR) 40 MG tablet Take 1 tablet (40 mg total) by mouth daily. 05/31/18   Chambliss, Jeb Levering, MD  busPIRone (BUSPAR) 5 MG tablet Take 5 mg by mouth 2 (two) times daily as needed (anxiety attacks).  12/02/18   [name], MD  ferrous sulfate 324 MG TBEC Take 324 mg by mouth.    [provider]  furosemide (LASIX) 40 MG tablet Take 1 tablet (40 mg total) by mouth 2 (two) times daily. 01/01/19   Mullis, Kiersten P, DO  metoprolol tartrate (LOPRESSOR) 25 MG tablet TAKE 1/2 TABLET BY MOUTH 2 TIMES DAILY 01/02/19   Chambliss, Jeb Levering, MD  Multiple Vitamins-Minerals (ADULT ONE DAILY GUMMIES) CHEW Chew 1 tablet by mouth daily.    [provider]  omeprazole (PRILOSEC) 20 MG capsule TAKE 1 CAPSULE BY MOUTH DAILY AS NEEDED Patient taking differently: Take 20 mg by mouth at bedtime.  11/29/18   [name], MD  traZODone (DESYREL) 50 MG tablet Take 0.5 tablets (25 mg total) by mouth at bedtime as needed for sleep. 01/01/19  Mullis, Kiersten P, DO  vitamin B-12 1000 MCG tablet Take 1 tablet (1,000 mcg total) by mouth daily. 01/02/19   Mullis, Kiersten P, DO    ALLERGIES:  No Known Allergies  SOCIAL HISTORY:  Social History   Tobacco Use  . Smoking status: Former Smoker    Packs/day: 0.30    Years: 8.00    Pack years: 2.40    Types: Cigarettes    Quit date: 05/30/2004    Years since quitting: 14.6  . Smokeless tobacco: Never Used  Substance Use Topics  . Alcohol use: No    Alcohol/week: 0.0 standard drinks    FAMILY HISTORY: Family History  Problem Relation Age of Onset  . Heart disease Mother        died at age 43  . Breast cancer Sister   . Cancer Sister         Breast  . Hypertension Father   . Hypertension Sister     EXAM: BP (!) 117/53   Pulse (!) 105   Temp 98.4 F (36.9 C) (Oral)   Resp (!) 22   SpO2 99%  CONSTITUTIONAL: Alert and oriented and responds appropriately to questions.  Elderly, in no distress HEAD: Normocephalic EYES: Conjunctivae clear, pupils appear equal, EOMI ENT: normal nose; moist mucous membranes NECK: Supple, no meningismus, no nuchal Rigidity, no LAD  CARD: Regular and minimally tachycardic; S1 and S2 appreciated; no murmurs, no clicks, no rubs, no gallops RESP: Normal chest excursion without splinting or tachypnea; breath sounds clear and equal bilaterally; no wheezes, no rhonchi, no rales, no hypoxia or respiratory distress, speaking full sentences ABD/GI: Normal bowel sounds; non-distended; soft, non-tender, no rebound, no guarding, no peritoneal signs, no hepatosplenomegaly BACK:  The back appears normal and is non-tender to palpation, there is no CVA tenderness EXT: Normal ROM in all joints; non-tender to palpation; no edema; normal capillary refill; no cyanosis, no calf tenderness or swelling    SKIN: Normal color for age and race; warm; no rash NEURO: Moves all extremities equally PSYCH: The patient's mood and manner are appropriate. Grooming and personal hygiene are appropriate.  MEDICAL DECISION MAKING: Patient here with complaints of shortness of breath.  She is here frequently for the same and likely related to her CHF and anxiety.  States feeling better after Versed.  No hypoxia or increased work of breathing here.  No chest pain or chest discomfort to suggest ACS.  Will obtain labs, x-ray.   ED PROGRESS: EKG shows no new ischemic change.  Chest x-ray consistent with mild pulmonary edema.  States no longer feeling short of breath.  No hypoxia on room air at rest.  Will ambulate with pulse oximeter.  If no hypoxia with ambulation, will discharge home.  Given Lasix here.  Discussed with patient's  daughter Hannah [name] by phone who is also comfortable with this plan.  Labs show anemia and chronic kidney disease which appears stable compared to previous labs.  BNP mildly elevated at 297.  3:20 AM  Pt's oxygen saturation dropped down to 89 to 90% on room air with ambulation of only approximately 30 feet and she became tachypneic and complaining of shortness of breath.  Does not wear oxygen at home.  At rest on room air her sats are between 90 to 93%.  Will admit given this hypoxia with ambulation.  Receiving IV diuresis.  Will update her daughter.  PCP is with family medicine.   3:26 AM Discussed patient's case with FM resident.  I have  recommended admission and patient (and family if present) agree with this plan. Admitting physician will place admission orders.   I reviewed all nursing notes, vitals, pertinent previous records, EKGs, lab and urine results, imaging (as available).    EKG Interpretation  Date/Time:  Wednesday January 18 2019 00:12:41 EDT Ventricular Rate:  102 PR Interval:    QRS Duration: 136 QT Interval:  394 QTC Calculation: 514 R Axis:   57 Text Interpretation:  Sinus tachycardia Ventricular premature complex Left bundle branch block No significant change since last tracing Confirmed by Pryor Curia [phone]) on 01/18/2019 2:08:36 AM        Hannah [name] was evaluated in Emergency Department on 01/18/2019 for the symptoms described in the history of present illness. She was evaluated in the context of the global COVID-19 pandemic, which necessitated consideration that the patient might be at risk for infection with the SARS-CoV-2 virus that causes COVID-19. Institutional protocols and algorithms that pertain to the evaluation of patients at risk for COVID-19 are in a state of rapid change based on information released by regulatory bodies including the CDC and federal and state organizations. These policies and algorithms were followed during the patient's care in  the ED.    Laikynn Pollio, Delice Bison, DO 01/18/19 0327    Alecea Trego, Delice Bison, DO 01/18/19 [phone]

## 2019-01-18 NOTE — Progress Notes (Signed)
FPTS Interim Progress Note  S:Patient sitting up in bed and asking if she can go home. She says she feels much better and she felt short of breath because she was upset about her sister's death. She says she has now talked with family and her pastor and feels better. She says she needs her valve replaced to also help, but overall she has no complaints this am.   O: BP (!) 116/51   Pulse 99   Temp 98.4 F (36.9 C) (Oral)   Resp 20   Ht 5\' 2"  (1.575 m)   Wt 75.8 kg   SpO2 97%   BMI 30.54 kg/m   General: NAD, pleasant Neck: Supple, no LAD Cardiovascular: RRR, no m/r/g, no LE edema Respiratory: CTA BL, normal work of breathing Gastrointestinal: soft, nontender, nondistended, normoactive BS MSK: moves 4 extremities equally Derm: no rashes appreciated Neuro: CN II-XII grossly intact Psych: AOx3, appropriate affect  A/P:  Acute on chronic hypoxic respiratory failure, now resolved likely secondary to pulmonary edema in the setting of symptomatic mitral regurgitation.  There is also an overlying component of anxiety.   - will discuss with valvular heart team as well as her daughter further steps. -will follow up BMP after IV lasix this am, will send home on increased dose   Acute kidney injury in the setting of CKD 3, avoid nephrotoxic agents monitor urine output closely.  Possibly due to slight volume overload.   --will follow up BMP after IV lasix this am  Zahmir Lalla, [place], DO 01/18/2019, 8:48 AM PGY-3, [facility] pager [phone]

## 2019-01-18 NOTE — ED Notes (Signed)
Pt placed in hallway bed to wait for family member to come pick her up. VSS, NAD. Pt saw sw before leaving.

## 2019-01-18 NOTE — H&P (Addendum)
[facility] Admission History and Physical Service Pager: [phone]  [name] Medical record number: [id number] Date of birth: Feb 20, 1946 Age: 73 y.o. Gender: female  Primary Care Provider: [name], MD Consultants: None Code Status: Full Preferred Emergency Contact: Hannah [name] (daughter), [phone]  Chief Complaint: Shortness of breath  Assessment and Plan: Hannah [name] is a 73 y.o. female presenting with gradual onset shortness of breath likely secondary to known mitral regurgitation.  Shortness of Breath likely secondary to volume overload Gradual onset shortness of breath throughout the week.  BNP elevated although at baseline and chest x-ray consistent with volume overload.  Patient has known mitral valve regurgitation which is likely contributing.  Patient received 40 mg IV Lasix in the emergency department.  Creatinine stable from last admission.  Patient appears to be at recent baseline with no oxygen requirement at rest and mild desaturation with activity. Weight stable since 9/28 at 167 lbs. Up from 161 lbs on 9/13. -Admit to family medicine teaching service, inpatient, Dr. Owens Shark -Vital signs per floor routine -Continuous pulse oximetry -Supplemental oxygen as needed -Cardiac monitoring for 24 hours -Re-dose Lasix 40 mg IV at 0900 if needed -Strict urine output -Daily weights -Metoprolol 12.5 mg twice daily -Reperform ambulation with pulse ox prior to DC -Lovenox for DVT prophylaxis -Heart healthy diet  HFpEF 2/2 mitral valve regurgitation Severe mitral valve regurgitation seen at last admission.  Patient recently saw Dr. Roxy Manns of [place] on 9/28 and is to undergo transcatheter repair in the near future.  Will treat symptomatically with diuretics and oxygen. -Re-dose Lasix at 0900 if needed -Patient to follow-up with cardiothoracic surgery as outpatient -Supplemental oxygen as needed   Stage IV  CKD GFR 27 admission.  Creatinine stable at 1.78 from 1.68 on day of discharge last admission.  We will have to diurese her carefully given her kidney function.  Will get daily BMP while admitted.  Status post IV Lasix 40 mg. -Re-dose IV Lasix 40 mg at 0900 if appropriate -Daily BMP while admitted -Avoid IV fluids due to fluid overload  Anemia Hemoglobin 8.9.  Down a little bit from 9.5 on 9/12.  MCV 97.9.  Continue to monitor with daily CBC.  Hypertension: BP 148/91 on admission. Patient on home metoprolol 12.5 mg twice daily -Continue home metoprolol  HLD: Patient on home atorvastatin 40 mg.  Reviewed cholesterol panel from 12/09/2018.  Cholesterol 127, HDL 55, LDL 63. -Hold atorvastatin 40 mg  Anxiety: Meds include BuSpar 5 mg, trazodone - continue buspirone 5 mg, continue trazodone 25 mg at bedtime  Dementia: Patient with known dementia.  Has had several ED visits due to shortness of breath recently.  From previous notes patient's daughter stated that patient is beyond her level of care, but states COVID concerns for not placing her in a facility.  PMH is significant for Dementia,HTN, HLD, GERD, HFpEF, mitral regurgitation, OA, Anxiety, Anemia.   FEN/GI: Heart healthy Prophylaxis: Lovenox  Disposition: Pending clinical course  History of Present Illness:  Hannah [name] is a 73 y.o. female presenting with shortness of breath.  Caveat given the history as patient has known dementia and her daughter was not in the room during my exam.  History taken from ED provider.  Per ED provider report patient developed shortness of breath shortly after midnight on 01/18/2019.  It is unclear if this was an acute process, but the patient endorses gradual shortness breath for most of the week.  Patient's daughter  called EMS.  Patient was apparently hypoxic on EMS arrival.  She was given 125 mg of Solu-Medrol and 2.5 mg of IV Versed for unknown reasons.  Patient recently admitted on 9/9 and  discharged on 9/13 for some shortness of breath.  She was found to have severe mitral regurgitation.  She was referred to cardiothoracic surgery and was seen by Dr. Roxy Manns as outpatient on 9/28.  Per that note the patient is to undergo a transcatheter edge-to-edge repair in the near future.  She was discharged from the hospitalization on 40 mg twice daily Lasix.  Patient also has known anxiety which occasionally provokes her shortness of breath.  Patient reports that she has been having good urine output.  Patient was given 40 mg IV Lasix by the ED team.  She was satting in the low 90s on room air.  Apparently she desatted to 90 after walking 30 feet.  Upon review of the chart this apparently has been the patient's baseline for the last few months.  Lab work consistent with a BNP of 297 which is down significantly from 713 at previous admission.  Her hemoglobin is 8.9 which is up from 7.1 at previous admission.  Creatinine 1.78, was 1.68 on day of discharge last admission.  Patient had 2 view chest x-ray which showed interstitial edema, appearance roughly the same as last admission.  EKG with left bundle branch block but no significant change since last tracing.  Review Of Systems: Per HPI with the following additions:   Review of Systems  Unable to perform ROS: Dementia   Patient Active Problem List   Diagnosis Date Noted  . CHF (congestive heart failure) ([place]) 01/18/2019  . Left bundle branch block (LBBB) determined by electrocardiography 12/29/2018  . Mitral valve insufficiency   . Vitamin B12 deficiency 12/28/2018  . Acute diastolic (congestive) heart failure ([place])   . Gastric bleed   . Macrocytic anemia 10/27/2018  . 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 Gout 09/11/2015  . History of iron deficiency anemia 05/13/2015  . OBESITY 03/26/2010  . Essential hypertension 08/09/2008  . GERD 08/09/2008    Past Medical History: Past Medical History:   Diagnosis Date  . Anemia   . Anxiety   . Anxiety state 08/09/2008   Spoke with Daughter came up with following plan  If patient has episode of shortness of breath or chest pain or distress they will check her pulse ox  If < 85 or she seems to lose cx they will cal 911  Other wise will treat as panic attack and give buspar up to 2 caps four times a day and try to calm and redirect her.     . Arthritis    "hands" (05/09/2015)  . CHF (congestive heart failure) ([place])   . Elevated TSH 02/03/2018  . GERD (gastroesophageal reflux disease)   . High cholesterol   . History of blood transfusion 1960s; 05/08/2015   "when I had my kids; low HgB"  . History of iron deficiency anemia 05/13/2015  . Hypertension   . Left bundle branch block (LBBB) determined by electrocardiography 12/29/2018  . Osteoarthritis   . Pneumonia 05/2004  . Renal insufficiency 11/09/2017  . 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 SOB (shortness of breath) 05/08/2015  . Vitamin B12 deficiency 12/28/2018    Past Surgical History: Past Surgical History:  Procedure Laterality Date  . BIOPSY  10/28/2018   Procedure: BIOPSY;  Surgeon: Juanita Craver, MD;  Location: [facility] ENDOSCOPY;  Service: Endoscopy;;  . CARDIAC CATHETERIZATION  05/2004  . COLONOSCOPY  07/2002   Archie Endo 09/02/2010  . COLONOSCOPY WITH PROPOFOL N/A 10/28/2018   Procedure: COLONOSCOPY WITH PROPOFOL;  Surgeon: Juanita Craver, MD;  Location: [facility] ENDOSCOPY;  Service: Endoscopy;  Laterality: N/A;  . ESOPHAGOGASTRODUODENOSCOPY  07/2002   w/biopsies/notes 09/02/2010  . ESOPHAGOGASTRODUODENOSCOPY (EGD) WITH PROPOFOL N/A 10/28/2018   Procedure: ESOPHAGOGASTRODUODENOSCOPY (EGD) WITH PROPOFOL;  Surgeon: Juanita Craver, MD;  Location: [facility] ENDOSCOPY;  Service: Endoscopy;  Laterality: N/A;  . HOT HEMOSTASIS N/A 10/28/2018   Procedure: HOT HEMOSTASIS (ARGON PLASMA COAGULATION/BICAP);  Surgeon: Juanita Craver, MD;  Location: [facility] ENDOSCOPY;  Service: Endoscopy;  Laterality: N/A;  . IR ANGIOGRAM SELECTIVE EACH  ADDITIONAL VESSEL  10/30/2018  . IR ANGIOGRAM SELECTIVE EACH ADDITIONAL VESSEL  10/30/2018  . IR ANGIOGRAM SELECTIVE EACH ADDITIONAL VESSEL  10/30/2018  . IR ANGIOGRAM SELECTIVE EACH ADDITIONAL VESSEL  10/30/2018  . IR ANGIOGRAM VISCERAL SELECTIVE  10/30/2018  . IR EMBO ART  VEN HEMORR LYMPH EXTRAV  INC GUIDE ROADMAPPING  10/30/2018  . IR US GUIDANCE  10/30/2018  . POLYPECTOMY  10/28/2018   Procedure: POLYPECTOMY;  Surgeon: Juanita Craver, MD;  [facility] ENDOSCOPY;  Service: Endoscopy;;  . RIGHT/LEFT HEART CATH AND CORONARY ANGIOGRAPHY N/A 12/29/2018   Procedure: RIGHT/LEFT HEART CATH AND CORONARY ANGIOGRAPHY;  Surgeon: Leonie Man, MD;  Location: [facility] CV LAB;  Service: Cardiovascular;  Laterality: N/A;  . TEE WITHOUT CARDIOVERSION N/A 12/30/2018   Procedure: TRANSESOPHAGEAL ECHOCARDIOGRAM (TEE);  Surgeon: Lelon Perla, MD;  Location: [facility] ENDOSCOPY;  Service: Cardiovascular;  Laterality: N/A;    Social History: Social History   Tobacco Use  . Smoking status: Former Smoker    Packs/day: 0.30    Years: 8.00    Pack years: 2.40    Types: Cigarettes    Quit date: 05/30/2004    Years since quitting: 14.6  . Smokeless tobacco: Never Used  Substance Use Topics  . Alcohol use: No    Alcohol/week: 0.0 standard drinks  . Drug use: Never   Additional social history:  Please also refer to relevant sections of EMR.  Family History: Family History  Problem Relation Age of Onset  . Heart disease Mother        died at age 1  . Breast cancer Sister   . Cancer Sister        Breast  . Hypertension Father   . Hypertension Sister     Allergies and Medications: No Known Allergies No current facility-administered medications on file prior to encounter.    Current Outpatient Medications on File Prior to Encounter  Medication Sig Dispense Refill  . acetaminophen (TYLENOL) 325 MG tablet Take 650 mg by mouth every 6 (six) hours as needed for mild pain or headache.    Marland Kitchen atorvastatin  (LIPITOR) 40 MG tablet Take 1 tablet (40 mg total) by mouth daily. 90 tablet 3  . busPIRone (BUSPAR) 5 MG tablet Take 5 mg by mouth 2 (two) times daily as needed (anxiety attacks).  60 tablet 1  . ferrous sulfate 324 MG TBEC Take 324 mg by mouth.    . furosemide (LASIX) 40 MG tablet Take 1 tablet (40 mg total) by mouth 2 (two) times daily. 60 tablet 0  . metoprolol tartrate (LOPRESSOR) 25 MG tablet TAKE 1/2 TABLET BY MOUTH 2 TIMES DAILY 90 tablet 0  . Multiple Vitamins-Minerals (ADULT ONE DAILY GUMMIES) CHEW Chew 1 tablet by mouth daily.    Marland Kitchen omeprazole (PRILOSEC) 20 MG  capsule TAKE 1 CAPSULE BY MOUTH DAILY AS NEEDED (Patient taking differently: Take 20 mg by mouth at bedtime. ) 180 capsule 0  . traZODone (DESYREL) 50 MG tablet Take 0.5 tablets (25 mg total) by mouth at bedtime as needed for sleep. 30 tablet 0  . vitamin B-12 1000 MCG tablet Take 1 tablet (1,000 mcg total) by mouth daily. 30 tablet 0    Objective: BP (!) 119/48   Pulse 96   Temp 98.4 F (36.9 C) (Oral)   Resp (!) 26   Ht 5\' 2"  (1.575 m)   Wt 75.8 kg   SpO2 97%   BMI 30.54 kg/m  Exam: General: Very pleasant 73 year old Caucasian female, no acute distress, nasal cannula in place ENTM: Nasal cannula in place.  No cervical lymphadenopathy. Cardiovascular: Tachycardia, regular rhythm, trace pitting edema bilateral lower extremity, skin warm and dry Respiratory: Lungs clear to auscultation bilaterally, no sensory muscle use, nasal cannula in place Gastrointestinal: Soft, nontender, nondistended MSK: No weakness in bilateral arms or legs noted. Derm: Skin warm and dry Neuro: No acute focal neurologic abnormality Psych: very pleasant, often repeats the same statements throughout the exam  Labs and Imaging: CBC BMET  Recent Labs  Lab 01/17/19 2359  WBC 9.5  HGB 8.9*  HCT 28.5*  PLT 303   Recent Labs  Lab 01/17/19 2359  NA 139  K 3.6  CL 97*  CO2 29  BUN 31*  CREATININE 1.78*  GLUCOSE 195*  CALCIUM 8.4*       EKG: Tachycardia, left bundle branch block, no significant change since last visit  Guadalupe Dawn, MD 01/18/2019, 4:26 AM PGY-3, [employer] Intern pager: [phone], text pages welcome

## 2019-01-18 NOTE — ED Notes (Signed)
TELE 

## 2019-01-23 ENCOUNTER — Other Ambulatory Visit: Payer: Self-pay

## 2019-01-23 DIAGNOSIS — I340 Nonrheumatic mitral (valve) insufficiency: Secondary | ICD-10-CM

## 2019-01-24 ENCOUNTER — Ambulatory Visit: Payer: Medicare Other | Admitting: Family Medicine

## 2019-01-25 ENCOUNTER — Encounter: Payer: Self-pay | Admitting: Family Medicine

## 2019-01-26 ENCOUNTER — Other Ambulatory Visit: Payer: Self-pay

## 2019-01-26 NOTE — Patient Outreach (Signed)
Case closure:  Patient transferred to this case manager after 3 unsuccessful outreach attempt and letter was sent on 01/12/2019.  PLAN: will close case as patient/ daughter have neither returned calls or responded to outreach letter.  Tomasa Rand, RN, BSN, CEN [facility] [employer] [phone]

## 2019-01-27 NOTE — Progress Notes (Signed)
Your procedure is scheduled on Thursday, Oct. 15th.  Report to [facility] Main Entrance "A" at 5:30 A.M., and check in at the Admitting office.              Your surgery or procedure is scheduled for 7:30 AM  Call this number if you have problems the morning of surgery:  [phone]  Call [phone] if you have any questions prior to your surgery date Monday-Friday 8am-4pm   Remember:  Do not eat or drink after midnight the night before your surgery   Take these medicines the morning of surgery with A SIP OF WATER:   Atorvastatin (Lipitor)  Metoprolol  Omeprazole (Prilosec)  Tylenol - if needed  7 days prior to surgery STOP taking any Aspirin (unless otherwise instructed by your surgeon), Aleve, Naproxen, Ibuprofen, Motrin, Advil, Goody's, BC's, all herbal medications, fish oil, and all vitamins.  Special instructions:   [name]- Preparing For Surgery  Before surgery, you can play an important role. Because skin is not sterile, your skin needs to be as free of germs as possible. You can reduce the number of germs on your skin by washing with CHG (chlorahexidine gluconate) Soap before surgery.  CHG is an antiseptic cleaner which kills germs and bonds with the skin to continue killing germs even after washing.    Oral Hygiene is also important to reduce your risk of infection.  Remember - BRUSH YOUR TEETH THE MORNING OF SURGERY WITH YOUR REGULAR TOOTHPASTE  Please do not use if you have an allergy to CHG or antibacterial soaps. If your skin becomes reddened/irritated stop using the CHG.  Do not shave (including legs and underarms) for at least 48 hours prior to first CHG shower. It is OK to shave your face.  Please follow these instructions carefully.   1. Shower the NIGHT BEFORE SURGERY and the MORNING OF SURGERY with CHG Soap.   2. If you chose to wash your hair, wash your hair first as usual with your normal shampoo.  3. After you shampoo,wash your face and private  area with the soap you use at home, then rinse your hair and body thoroughly to remove the shampoo and soap.   4. Use CHG as you would any other liquid soap. You can apply CHG directly to the skin and wash gently with a scrungie or a clean washcloth.   5. Apply the CHG Soap to your body ONLY FROM THE NECK DOWN.  Do not use on open wounds or open sores. Avoid contact with your eyes, ears, mouth and genitals (private parts).   6. Wash thoroughly, paying special attention to the area where your surgery will be performed.  7. Thoroughly rinse your body with warm water from the neck down.  8. DO NOT shower/wash with your normal soap after using and rinsing off the CHG Soap.  9. Pat yourself dry with a CLEAN TOWEL.  10. Wear CLEAN PAJAMAS to bed the night before surgery, wear comfortable clothes the morning of surgery  11. Place CLEAN SHEETS on your bed the night of your first shower and DO NOT SLEEP WITH PETS.   Day of Surgery: Shower as instructed above. Do not apply any deodorants/lotions, powders or colognes.. Please wear clean clothes to the hospital/surgery center.   Remember to brush your teeth WITH YOUR REGULAR TOTHPASTE.   Do not wear jewelry, make-up or nail polish.   Do not shave 48 hours prior to surgery.    Do  not bring valuables to the hospital.  [facility] is not responsible for any belongings or valuables.  Contacts, glasses, hearing aids, dentures or bridgework may not be worn into surgery.   Leave your suitcase in the car.  After surgery it may be brought to your room.  For patients admitted to the hospital, discharge time will be determined by your treatment team.  Please read over the following fact sheets that you were given: Pain Booklet, Patient Instructions for Mupirocin Application, Coughing and Deep Breathing, Surgical Site Infections.

## 2019-01-27 NOTE — Progress Notes (Addendum)
PCP - Dr Sallye Ober  Cardiologist - Dr Johnsie Cancel  Chest x-ray - 01/30/2019  EKG - 01/18/2019  Stress Test - no  ECHO - 9/11,2020  Cardiac Cath - 01/23/2019  Sleep Study - no CPAP - no  LABS-CBC, CMP, PT, PTT, A1C, ABG, UA, PCR  ASA-no  ERAS-no  HA1C-4.4 Fasting Blood Sugar - na Checks Blood Sugar ____na_ times a day  Anesthesia-  Pt denies having chest pain, sob, or fever at this time. All instructions explained to the pt, with a verbal understanding of the material. Pt agrees to go over the instructions while at home for a better understanding. Pt also instructed to self quarantine after being tested for COVID-19. The opportunity to ask questions was provided.  I called and left a voice message for Theodosia Quay, RN with Hemoglobin of 9.5.

## 2019-01-27 NOTE — Progress Notes (Signed)
[id number] [name], [name] [address] Phone: [phone] Fax: [phone]      Your procedure is scheduled on Thursday, Oct. 15th.  Report to [facility] Main Entrance "A" at 5:30 A.M., and check in at the Admitting office.  Call this number if you have problems the morning of surgery:  [phone]  Call [phone] if you have any questions prior to your surgery date Monday-Friday 8am-4pm    Remember:  Do not eat or drink after midnight the night before your surgery   Take these medicines the morning of surgery with A SIP OF WATER   Tylenol - if needed  Atorvastatin (Lipitor)  Metoprolol  Omeprazole (Prilosec)    7 days prior to surgery STOP taking any Aspirin (unless otherwise instructed by your surgeon), Aleve, Naproxen, Ibuprofen, Motrin, Advil, Goody's, BC's, all herbal medications, fish oil, and all vitamins.    The Morning of Surgery  Do not wear jewelry, make-up or nail polish.  Do not wear lotions, powders, or perfumes, or deodorant  Do not shave 48 hours prior to surgery.    Do not bring valuables to the hospital.  [facility] is not responsible for any belongings or valuables.  If you are a smoker, DO NOT Smoke 24 hours prior to surgery IF you wear a CPAP at night please bring your mask, tubing, and machine the morning of surgery   Remember that you must have someone to transport you home after your surgery, and remain with you for 24 hours if you are discharged the same day.   Contacts, glasses, hearing aids, dentures or bridgework may not be worn into surgery.    Leave your suitcase in the car.  After surgery it may be brought to your room.  For patients admitted to the hospital, discharge time will be determined by your treatment team.  Patients discharged the day of surgery will not be allowed to drive home.    Special instructions:   [place]- Preparing For Surgery  Before  surgery, you can play an important role. Because skin is not sterile, your skin needs to be as free of germs as possible. You can reduce the number of germs on your skin by washing with CHG (chlorahexidine gluconate) Soap before surgery.  CHG is an antiseptic cleaner which kills germs and bonds with the skin to continue killing germs even after washing.    Oral Hygiene is also important to reduce your risk of infection.  Remember - BRUSH YOUR TEETH THE MORNING OF SURGERY WITH YOUR REGULAR TOOTHPASTE  Please do not use if you have an allergy to CHG or antibacterial soaps. If your skin becomes reddened/irritated stop using the CHG.  Do not shave (including legs and underarms) for at least 48 hours prior to first CHG shower. It is OK to shave your face.  Please follow these instructions carefully.   1. Shower the NIGHT BEFORE SURGERY and the MORNING OF SURGERY with CHG Soap.   2. If you chose to wash your hair, wash your hair first as usual with your normal shampoo.  3. After you shampoo, rinse your hair and body thoroughly to remove the shampoo.  4. Use CHG as you would any other liquid soap. You can apply CHG directly to the skin and wash gently with a scrungie or a clean washcloth.   5. Apply the CHG Soap to your body ONLY FROM THE NECK DOWN.  Do not use on  open wounds or open sores. Avoid contact with your eyes, ears, mouth and genitals (private parts). Wash Face and genitals (private parts)  with your normal soap.   6. Wash thoroughly, paying special attention to the area where your surgery will be performed.  7. Thoroughly rinse your body with warm water from the neck down.  8. DO NOT shower/wash with your normal soap after using and rinsing off the CHG Soap.  9. Pat yourself dry with a CLEAN TOWEL.  10. Wear CLEAN PAJAMAS to bed the night before surgery, wear comfortable clothes the morning of surgery  11. Place CLEAN SHEETS on your bed the night of your first shower and DO NOT SLEEP  WITH PETS.    Day of Surgery:  Do not apply any deodorants/lotions. Please shower the morning of surgery with the CHG soap  Please wear clean clothes to the hospital/surgery center.   Remember to brush your teeth WITH YOUR REGULAR TOOTHPASTE.   Please read over the following fact sheets that you were given.

## 2019-01-30 ENCOUNTER — Ambulatory Visit (HOSPITAL_COMMUNITY)
Admission: RE | Admit: 2019-01-30 | Discharge: 2019-01-30 | Disposition: A | Discharge status: Home / Self Care | Payer: Medicare Other | Source: Ambulatory Visit | Attending: Cardiovascular Disease | Admitting: Cardiovascular Disease

## 2019-01-30 ENCOUNTER — Encounter (HOSPITAL_COMMUNITY)
Admission: RE | Admit: 2019-01-30 | Discharge: 2019-01-30 | Disposition: A | Discharge status: Home / Self Care | Payer: Medicare Other | Source: Ambulatory Visit | Attending: Cardiovascular Disease | Admitting: Cardiovascular Disease

## 2019-01-30 ENCOUNTER — Encounter (HOSPITAL_COMMUNITY): Payer: Self-pay

## 2019-01-30 ENCOUNTER — Ambulatory Visit: Payer: Medicare Other | Attending: Cardiovascular Disease | Admitting: Physical Therapy

## 2019-01-30 ENCOUNTER — Encounter: Payer: Self-pay | Admitting: Physical Therapy

## 2019-01-30 ENCOUNTER — Other Ambulatory Visit (HOSPITAL_COMMUNITY)
Admission: RE | Admit: 2019-01-30 | Discharge: 2019-01-30 | Disposition: A | Discharge status: Home / Self Care | Payer: Medicare Other | Source: Ambulatory Visit | Attending: Cardiovascular Disease | Admitting: Cardiovascular Disease

## 2019-01-30 ENCOUNTER — Other Ambulatory Visit: Payer: Self-pay

## 2019-01-30 DIAGNOSIS — E876 Hypokalemia: Secondary | ICD-10-CM | POA: Diagnosis not present

## 2019-01-30 DIAGNOSIS — J45909 Unspecified asthma, uncomplicated: Secondary | ICD-10-CM | POA: Diagnosis not present

## 2019-01-30 DIAGNOSIS — N1832 Chronic kidney disease, stage 3b: Secondary | ICD-10-CM | POA: Diagnosis not present

## 2019-01-30 DIAGNOSIS — Z952 Presence of prosthetic heart valve: Secondary | ICD-10-CM | POA: Diagnosis not present

## 2019-01-30 DIAGNOSIS — I130 Hypertensive heart and chronic kidney disease with heart failure and stage 1 through stage 4 chronic kidney disease, or unspecified chronic kidney disease: Secondary | ICD-10-CM | POA: Diagnosis not present

## 2019-01-30 DIAGNOSIS — Z87891 Personal history of nicotine dependence: Secondary | ICD-10-CM | POA: Diagnosis not present

## 2019-01-30 DIAGNOSIS — K219 Gastro-esophageal reflux disease without esophagitis: Secondary | ICD-10-CM | POA: Diagnosis not present

## 2019-01-30 DIAGNOSIS — Z8249 Family history of ischemic heart disease and other diseases of the circulatory system: Secondary | ICD-10-CM | POA: Diagnosis not present

## 2019-01-30 DIAGNOSIS — I340 Nonrheumatic mitral (valve) insufficiency: Secondary | ICD-10-CM | POA: Diagnosis not present

## 2019-01-30 DIAGNOSIS — Z006 Encounter for examination for normal comparison and control in clinical research program: Secondary | ICD-10-CM | POA: Diagnosis not present

## 2019-01-30 DIAGNOSIS — E785 Hyperlipidemia, unspecified: Secondary | ICD-10-CM | POA: Diagnosis not present

## 2019-01-30 DIAGNOSIS — E7800 Pure hypercholesterolemia, unspecified: Secondary | ICD-10-CM | POA: Diagnosis not present

## 2019-01-30 DIAGNOSIS — I672 Cerebral atherosclerosis: Secondary | ICD-10-CM | POA: Diagnosis not present

## 2019-01-30 DIAGNOSIS — R2689 Other abnormalities of gait and mobility: Secondary | ICD-10-CM | POA: Insufficient documentation

## 2019-01-30 DIAGNOSIS — Z79899 Other long term (current) drug therapy: Secondary | ICD-10-CM | POA: Diagnosis not present

## 2019-01-30 DIAGNOSIS — Z8719 Personal history of other diseases of the digestive system: Secondary | ICD-10-CM | POA: Diagnosis not present

## 2019-01-30 DIAGNOSIS — D509 Iron deficiency anemia, unspecified: Secondary | ICD-10-CM | POA: Diagnosis not present

## 2019-01-30 DIAGNOSIS — E538 Deficiency of other specified B group vitamins: Secondary | ICD-10-CM | POA: Diagnosis not present

## 2019-01-30 DIAGNOSIS — I428 Other cardiomyopathies: Secondary | ICD-10-CM | POA: Diagnosis not present

## 2019-01-30 DIAGNOSIS — I5043 Acute on chronic combined systolic (congestive) and diastolic (congestive) heart failure: Secondary | ICD-10-CM | POA: Diagnosis not present

## 2019-01-30 HISTORY — DX: Unspecified dementia, unspecified severity, without behavioral disturbance, psychotic disturbance, mood disturbance, and anxiety: F03.90

## 2019-01-30 LAB — CBC
HCT: 31.3 % — ABNORMAL LOW (ref 36.0–46.0)
Hemoglobin: 9.5 g/dL — ABNORMAL LOW (ref 12.0–15.0)
MCH: 31 pg (ref 26.0–34.0)
MCHC: 30.4 g/dL (ref 30.0–36.0)
MCV: 102.3 fL — ABNORMAL HIGH (ref 80.0–100.0)
Platelets: 237 10*3/uL (ref 150–400)
RBC: 3.06 MIL/uL — ABNORMAL LOW (ref 3.87–5.11)
RDW: 18 % — ABNORMAL HIGH (ref 11.5–15.5)
WBC: 8.5 10*3/uL (ref 4.0–10.5)
nRBC: 0 % (ref 0.0–0.2)

## 2019-01-30 LAB — URINALYSIS, ROUTINE W REFLEX MICROSCOPIC
Bilirubin Urine: NEGATIVE
Glucose, UA: NEGATIVE mg/dL
Hgb urine dipstick: NEGATIVE
Ketones, ur: NEGATIVE mg/dL
Nitrite: NEGATIVE
Protein, ur: NEGATIVE mg/dL
Specific Gravity, Urine: 1.005 (ref 1.005–1.030)
pH: 6 (ref 5.0–8.0)

## 2019-01-30 LAB — COMPREHENSIVE METABOLIC PANEL
ALT: 12 U/L (ref 0–44)
AST: 19 U/L (ref 15–41)
Albumin: 3.8 g/dL (ref 3.5–5.0)
Alkaline Phosphatase: 101 U/L (ref 38–126)
Anion gap: 15 (ref 5–15)
BUN: 27 mg/dL — ABNORMAL HIGH (ref 8–23)
CO2: 23 mmol/L (ref 22–32)
Calcium: 9 mg/dL (ref 8.9–10.3)
Chloride: 98 mmol/L (ref 98–111)
Creatinine, Ser: 1.22 mg/dL — ABNORMAL HIGH (ref 0.44–1.00)
GFR calc Af Amer: 50 mL/min — ABNORMAL LOW (ref >60)
GFR calc non Af Amer: 43 mL/min — ABNORMAL LOW (ref >60)
Glucose, Bld: 115 mg/dL — ABNORMAL HIGH (ref 70–99)
Potassium: 3.4 mmol/L — ABNORMAL LOW (ref 3.5–5.1)
Sodium: 136 mmol/L (ref 135–145)
Total Bilirubin: 0.8 mg/dL (ref 0.3–1.2)
Total Protein: 7.2 g/dL (ref 6.5–8.1)

## 2019-01-30 LAB — BLOOD GAS, ARTERIAL
Acid-Base Excess: 3.2 mmol/L — ABNORMAL HIGH (ref 0.0–2.0)
Bicarbonate: 27 mmol/L (ref 20.0–28.0)
Drawn by: 421801
FIO2: 21
O2 Saturation: 98.6 %
Patient temperature: 98.6
pCO2 arterial: 40 mmHg (ref 32.0–48.0)
pH, Arterial: 7.445 (ref 7.350–7.450)
pO2, Arterial: 102 mmHg (ref 83.0–108.0)

## 2019-01-30 LAB — SURGICAL PCR SCREEN
MRSA, PCR: NEGATIVE
Staphylococcus aureus: NEGATIVE

## 2019-01-30 LAB — HEMOGLOBIN A1C
Hgb A1c MFr Bld: 4.4 % — ABNORMAL LOW (ref 4.8–5.6)
Mean Plasma Glucose: 79.58 mg/dL

## 2019-01-30 LAB — BRAIN NATRIURETIC PEPTIDE: B Natriuretic Peptide: 277.2 pg/mL — ABNORMAL HIGH (ref 0.0–100.0)

## 2019-01-30 LAB — PROTIME-INR
INR: 1.1 (ref 0.8–1.2)
Prothrombin Time: 14.2 seconds (ref 11.4–15.2)

## 2019-01-30 LAB — APTT: aPTT: 29 seconds (ref 24–36)

## 2019-01-30 NOTE — Therapy (Signed)
[place], [place], [address] Phone: [phone]   Fax:  [phone]  Physical Therapy Evaluation  Patient Details  Name: Hannah [name] MRN: [id number] Date of Birth: [birth year] Referring Provider (PT): Dr Sherren Mocha    Encounter Date: 01/30/2019  PT End of Session - 01/30/19 1312    Visit Number  1    Number of Visits  1    Date for PT Re-Evaluation  01/30/19    PT Start Time  1150    PT Stop Time  1224    PT Time Calculation (min)  34 min    Activity Tolerance  Patient tolerated treatment well    Behavior During Therapy  [facility] for tasks assessed/performed       Past Medical History:  Diagnosis Date  . Anemia   . Anxiety   . Anxiety state 08/09/2008   Spoke with Daughter came up with following plan  If patient has episode of shortness of breath or chest pain or distress they will check her pulse ox  If < 85 or she seems to lose cx they will cal 911  Other wise will treat as panic attack and give buspar up to 2 caps four times a day and try to calm and redirect her.     . Arthritis    "hands" (05/09/2015)  . CHF (congestive heart failure) ([place])   . Dyspnea    since 10/2018  . Elevated TSH 02/03/2018  . GERD (gastroesophageal reflux disease)   . Heart murmur   . High cholesterol   . History of blood transfusion 1960s; 05/08/2015   "when I had my kids; low HgB"  . History of iron deficiency anemia 05/13/2015  . Hypertension   . Left bundle branch block (LBBB) determined by electrocardiography 12/29/2018  . Osteoarthritis   . Pneumonia 05/2004  . Renal insufficiency 11/09/2017  . 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 SOB (shortness of breath) 05/08/2015  . Vitamin B12 deficiency 12/28/2018    Past Surgical History:  Procedure Laterality Date  . BIOPSY  10/28/2018   Procedure: BIOPSY;  Surgeon: Juanita Craver, MD;  Location[facility] ENDOSCOPY;  Service: Endoscopy;;  . CARDIAC CATHETERIZATION  05/2004  .  COLONOSCOPY  07/2002   Archie Endo 09/02/2010  . COLONOSCOPY WITH PROPOFOL N/A 10/28/2018   Procedure: COLONOSCOPY WITH PROPOFOL;  Surgeon: Juanita Craver, MD;  Location: [facility] ENDOSCOPY;  Service: Endoscopy;  Laterality: N/A;  . ESOPHAGOGASTRODUODENOSCOPY  07/2002   w/biopsies/notes 09/02/2010  . ESOPHAGOGASTRODUODENOSCOPY (EGD) WITH PROPOFOL N/A 10/28/2018   Procedure: ESOPHAGOGASTRODUODENOSCOPY (EGD) WITH PROPOFOL;  Surgeon: Juanita Craver, MD;  Location: [facility] ENDOSCOPY;  Service: Endoscopy;  Laterality: N/A;  . HOT HEMOSTASIS N/A 10/28/2018   Procedure: HOT HEMOSTASIS (ARGON PLASMA COAGULATION/BICAP);  Surgeon: Juanita Craver, MD;  Location: [facility] ENDOSCOPY;  Service: Endoscopy;  Laterality: N/A;  . IR ANGIOGRAM SELECTIVE EACH ADDITIONAL VESSEL  10/30/2018  . IR ANGIOGRAM SELECTIVE EACH ADDITIONAL VESSEL  10/30/2018  . IR ANGIOGRAM SELECTIVE EACH ADDITIONAL VESSEL  10/30/2018  . IR ANGIOGRAM SELECTIVE EACH ADDITIONAL VESSEL  10/30/2018  . IR ANGIOGRAM VISCERAL SELECTIVE  10/30/2018  . IR EMBO ART  VEN HEMORR LYMPH EXTRAV  INC GUIDE ROADMAPPING  10/30/2018  . IR US GUIDANCE  10/30/2018  . POLYPECTOMY  10/28/2018   Procedure: POLYPECTOMY;  Surgeon: Juanita Craver, MD;  Location: [facility] ENDOSCOPY;  Service: Endoscopy;;  . RIGHT/LEFT HEART CATH AND CORONARY ANGIOGRAPHY N/A 12/29/2018   Procedure: RIGHT/LEFT HEART CATH AND CORONARY ANGIOGRAPHY;  Surgeon: Ellyn Hack,  Leonie Green, MD;  Location: [place] CV LAB;  Service: Cardiovascular;  Laterality: N/A;  . TEE WITHOUT CARDIOVERSION N/A 12/30/2018   Procedure: TRANSESOPHAGEAL ECHOCARDIOGRAM (TEE);  Surgeon: Lelon Perla, MD;  Location[facility] ENDOSCOPY;  Service: Cardiovascular;  Laterality: N/A;    There were no vitals filed for this visit.   Subjective Assessment - 01/30/19 1158    Subjective  In July the patient began having shortness of breath and difficulty breathing while sleeping.    Currently in Pain?  No/denies   has arthritis in her hands        [facility] PT Assessment -  01/30/19 0001      Assessment   Medical Diagnosis  Nitral Valve regurgitation     Referring Provider (PT)  Dr Sherren Mocha     Onset Date/Surgical Date  --   July 2020     Precautions   Precautions  None      Restrictions   Weight Bearing Restrictions  No      Balance Screen   Has the patient fallen in the past 6 months  No    Has the patient had a decrease in activity level because of a fear of falling?   No    Is the patient reluctant to leave their home because of a fear of falling?   No      Prior Function   Level of Independence  Independent      Cognition   Overall Cognitive Status  Within Functional Limits for tasks assessed    Attention  Focused    Focused Attention  Appears intact    Memory  Appears intact    Awareness  Appears intact    Problem Solving  Appears intact      Sensation   Light Touch  Appears Intact    Additional Comments  denies parathesias       ROM / Strength   AROM / PROM / Strength  AROM;PROM;Strength      Strength   Overall Strength Comments  5/5 gross UE and LE     Strength Assessment Site  Hand    Right/Left hand  Right;Left    Right Hand Grip (lbs)  40    Left Hand Grip (lbs)  40       [facility] Pre-Surgical Assessment - 01/30/19 0001    5 Meter Walk Test- trial 1  5 sec    5 Meter Walk Test- trial 2  5 sec.     5 Meter Walk Test- trial 3  5 sec.    5 meter walk test average  5 sec    4 Stage Balance Test tolerated for:   10 sec.    4 Stage Balance Test Position  3    comment  could not maintain single leg stance     Sit To Stand Test- trial 1  5 .    Comment  12 seconds     ADL/IADL Independent with:  Bathing;Dressing;Meal prep;Finances    ADL/IADL Needs Assistance with:  Valla Leaver work    6 Minute Walk- Baseline  yes    BP (mmHg)  130/68    HR (bpm)  93    02 Sat (%RA)  95 %    Modified Borg Scale for Dyspnea  0- Nothing at all    Perceived Rate of Exertion (Borg)  6-    6 Minute Walk Post Test  yes    BP (mmHg)  132/69    HR  (  bpm)  104    02 Sat (%RA)  91 %    Modified Borg Scale for Dyspnea  3- Moderate shortness of breath or breathing difficulty    Perceived Rate of Exertion (Borg)  13- Somewhat hard    Aerobic Endurance Distance Walked  295    Endurance additional comments  81% disability. Walked 185 then requested a seated rest break. Was able to stand and walk 110 feet more but then declined to walk further. She stated she cleaned the house before assessment and didnt feel up to it. Questionable if there is some baseline congative issues. Patients daughter here with patient to help.               Objective measurements completed on examination: See above findings.                           Plan - 01/30/19 1313    Clinical Impression Statement  See below    Examination-Participation Restrictions  Community Activity;Laundry;Shop;Yard Work;Meal Prep    Stability/Clinical Decision Making  Stable/Uncomplicated    Clinical Decision Making  Low    Rehab Potential  Excellent    PT Frequency  One time visit    PT Treatment/Interventions  Patient/family education       Patient will benefit from skilled therapeutic intervention in order to improve the following deficits and impairments:  Decreased endurance  Visit Diagnosis: Other abnormalities of gait and mobility   Clinical Impression Statement: Pt is a 73 yo female presenting to OP PT for evaluation prior to possible TAVR surgery due to severe aortic stenosis. Pt reports onset of dyspnea approximately 4 months ago. Symptoms are limiting her ability to walk and sleep. Pt presents with normal ROM and strength, decreased balance and is at high fall risk 4 stage balance test, decreased walking speed and decreased aerobic endurance per 6 minute walk test. Pt ambulated 185 feet in 1:30 before requesting a seated rest beak lasting 1:30. At time of rest, patient's HR was 104 bpm and O2 was 91 on room air. Pt reported 3/10 shortness of  breath on modified scale for dyspnea. Pt able to resume after rest and ambulate an additional 110 feet. Pt ambulated a total of 295 feet in 6 minute walk. B/P did not increase significantly with 6 minute walk test. Based on the Short Physical Performance Battery, patient has a frailty rating of 11/12 with </= 5/12 considered frail.   Problem List Patient Active Problem List   Diagnosis Date Noted  . CHF (congestive heart failure) ([place]) 01/18/2019  . Dyspnea 01/18/2019  . Left bundle branch block (LBBB) determined by electrocardiography 12/29/2018  . Mitral valve insufficiency   . Vitamin B12 deficiency 12/28/2018  . Acute diastolic (congestive) heart failure ([place])   . Gastric bleed   . Macrocytic anemia 10/27/2018  . 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 Gout 09/11/2015  . History of iron deficiency anemia 05/13/2015  . OBESITY 03/26/2010  . Essential hypertension 08/09/2008  . GERD 08/09/2008    Carney Living PT DPT  01/30/2019, 1:22 PM  [facility] [address] [place], [place], [address] Phone: [phone]   Fax:  [phone]  Name: Hannah [name] MRN: [id number] Date of Birth: 1945-05-29

## 2019-01-31 LAB — SARS CORONAVIRUS 2 (TAT 6-24 HRS): SARS Coronavirus 2: NEGATIVE

## 2019-01-31 NOTE — Progress Notes (Signed)
Anesthesia Chart Review:  Case: [id number] Date/Time: 02/02/19 0730   Procedure: MITRAL VALVE REPAIR (N/A )   Anesthesia type: General   Pre-op diagnosis: Severe Mitral Insufficiency   Location: [place] CATH LAB 6 / [place] INVASIVE CV LAB   Provider: Sherren Mocha, MD      DISCUSSION: Patient is a 73 year old female scheduled for the above procedure.  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   H/H 9.5/31.3, but consistent with recent results (up from 8.9/38.5 on 01/17/19). Negative COVID test 01/30/19. Anesthesia team to evaluate on the day of surgery.    VS: BP (!) 117/54   Pulse 82   Temp 36.6 C   Resp 18   Ht 5' 1.5" (1.562 m)   Wt 76 kg   SpO2 98%   BMI 31.14 kg/m    PROVIDERS: [name], MD is PCP  Jenkins Rouge, MD is primary cardiologist   LABS: Labs reviewed: Acceptable for surgery. Anemia appears stable. Cr 1.22, which is down from 1.73 on 01/18/19. (all labs ordered are listed, but only abnormal results are displayed)  Labs Reviewed  BLOOD GAS, ARTERIAL - Abnormal; Notable for the following components:      Result Value   Acid-Base Excess 3.2 (*)    All other components within normal limits  BRAIN NATRIURETIC PEPTIDE - Abnormal; Notable for the following components:   B Natriuretic Peptide 277.2 (*)    All other components within normal limits  CBC - Abnormal; Notable for the following components:   RBC 3.06 (*)    Hemoglobin 9.5 (*)    HCT 31.3 (*)    MCV 102.3 (*)    RDW 18.0 (*)    All other components within normal limits  COMPREHENSIVE METABOLIC PANEL - Abnormal; Notable for the following components:    Potassium 3.4 (*)    Glucose, Bld 115 (*)    BUN 27 (*)    Creatinine, Ser 1.22 (*)    GFR calc non Af Amer 43 (*)    GFR calc Af Amer 50 (*)    All other components within normal limits  HEMOGLOBIN A1C - Abnormal; Notable for the following components:   Hgb A1c MFr Bld 4.4 (*)    All other components within normal limits  URINALYSIS, ROUTINE W REFLEX MICROSCOPIC - Abnormal; Notable for the following components:   Color, Urine STRAW (*)    Leukocytes,Ua SMALL (*)    Bacteria, UA RARE (*)    All other components within normal limits  SURGICAL PCR SCREEN  APTT  PROTIME-INR  TYPE AND SCREEN     IMAGES: CXR 01/30/19: IMPRESSION: No acute cardiopulmonary abnormality.   EKG: 01/18/19: Sinus tachycardia at 102 bpm Ventricular premature complex Left bundle branch block No significant change since last tracing Confirmed by Ward, Cyril Mourning [phone]) on 01/18/2019 2:08:36 AM   CV: TEE 12/30/18: IMPRESSIONS  1. The left ventricle has low normal systolic function, with an ejection fraction of 50-55%. No evidence of left ventricular regional wall motion abnormalities.  2. The right ventricle has normal systolc function. The cavity was normal.  3. Left atrial size was severely dilated.  4. No evidence of a thrombus present in the left atrial appendage.  5. Trivial pericardial effusion is present.  6. The mitral valve  is abnormal. Mild thickening of the mitral valve leaflet. Mitral valve regurgitation is severe by color flow Doppler.  7. The aortic valve is tricuspid No stenosis of the aortic valve.  8. There is evidence of moderate plaque in the descending aorta.  9. Low normal LV systolic function; posterior MV leaflet appears to be restricted with severe MR; severe LAE.  Cardiac cath 12/29/18:  There is severe (3-4+) mitral regurgitation. Angiographically 3+, but based on MR related V wave would be 4+.  The left ventricular systolic function is normal. The left ventricular ejection  fraction is 50-55% by visual estimate.  Hemodynamic findings consistent with moderate pulmonary hypertension and mitral valve regurgitation & LV end diastolic pressure is moderately elevated.  Angiographically normal coronary arteries SUMMARY  Likely SEVERE MITRAL REGURGITATION with large V wave on PCWP waveform and PA waveform with at least 3-4+ MR on LV gram.  Moderate Secondary Pulmonary Hypertension related to elevated LVEDP and MR.  Angiographically normal coronary arteries   Past Medical History:  Diagnosis Date  . Anemia   . Anxiety   . Anxiety state 08/09/2008   Spoke with Daughter came up with following plan  If patient has episode of shortness of breath or chest pain or distress they will check her pulse ox  If < 85 or she seems to lose cx they will cal 911  Other wise will treat as panic attack and give buspar up to 2 caps four times a day and try to calm and redirect her.     . Arthritis    "hands" (05/09/2015)  . CHF (congestive heart failure) ([place])   . Dementia ([place])   . Dyspnea    since 10/2018  . Elevated TSH 02/03/2018  . GERD (gastroesophageal reflux disease)   . Heart murmur   . High cholesterol   . History of blood transfusion 1960s; 05/08/2015   "when I had my kids; low HgB"  . History of iron deficiency anemia 05/13/2015  . Hypertension   . Left bundle branch block (LBBB) determined by electrocardiography 12/29/2018  . Osteoarthritis   . Pneumonia 05/2004  . Renal insufficiency 11/09/2017  . 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 SOB (shortness of breath) 05/08/2015  . Vitamin B12 deficiency 12/28/2018    Past Surgical History:  Procedure Laterality Date  . BIOPSY  10/28/2018   Procedure: BIOPSY;  Surgeon: Juanita Craver, MD;  Location: [facility] ENDOSCOPY;  Service: Endoscopy;;  . CARDIAC CATHETERIZATION  05/2004  . COLONOSCOPY  07/2002   Archie Endo 09/02/2010  . COLONOSCOPY WITH PROPOFOL N/A 10/28/2018   Procedure: COLONOSCOPY WITH PROPOFOL;  Surgeon: Juanita Craver, MD;  Location:  [facility] ENDOSCOPY;  Service: Endoscopy;  Laterality: N/A;  . ESOPHAGOGASTRODUODENOSCOPY  07/2002   w/biopsies/notes 09/02/2010  . ESOPHAGOGASTRODUODENOSCOPY (EGD) WITH PROPOFOL N/A 10/28/2018   Procedure: ESOPHAGOGASTRODUODENOSCOPY (EGD) WITH PROPOFOL;  Surgeon: Juanita Craver, MD;  Location: [facility] ENDOSCOPY;  Service: Endoscopy;  Laterality: N/A;  . HOT HEMOSTASIS N/A 10/28/2018   Procedure: HOT HEMOSTASIS (ARGON PLASMA COAGULATION/BICAP);  Surgeon: Juanita Craver, MD;  Location: [facility] ENDOSCOPY;  Service: Endoscopy;  Laterality: N/A;  . IR ANGIOGRAM SELECTIVE EACH ADDITIONAL VESSEL  10/30/2018  . IR ANGIOGRAM SELECTIVE EACH ADDITIONAL VESSEL  10/30/2018  . IR ANGIOGRAM SELECTIVE EACH ADDITIONAL VESSEL  10/30/2018  . IR ANGIOGRAM SELECTIVE EACH ADDITIONAL VESSEL  10/30/2018  . IR ANGIOGRAM VISCERAL SELECTIVE  10/30/2018  . IR EMBO ART  VEN HEMORR LYMPH EXTRAV  INC GUIDE ROADMAPPING  10/30/2018  . IR US GUIDANCE  10/30/2018  .  POLYPECTOMY  10/28/2018   Procedure: POLYPECTOMY;  Surgeon: Juanita Craver, MD;  Location: [facility] ENDOSCOPY;  Service: Endoscopy;;  . RIGHT/LEFT HEART CATH AND CORONARY ANGIOGRAPHY N/A 12/29/2018   Procedure: RIGHT/LEFT HEART CATH AND CORONARY ANGIOGRAPHY;  Surgeon: Leonie Man, MD;  Location: [place] CV LAB;  Service: Cardiovascular;  Laterality: N/A;  . TEE WITHOUT CARDIOVERSION N/A 12/30/2018   Procedure: TRANSESOPHAGEAL ECHOCARDIOGRAM (TEE);  Surgeon: Lelon Perla, MD;  Location: [facility] ENDOSCOPY;  Service: Cardiovascular;  Laterality: N/A;    MEDICATIONS: . acetaminophen (TYLENOL) 325 MG tablet  . atorvastatin (LIPITOR) 40 MG tablet  . ferrous sulfate 324 MG TBEC  . furosemide (LASIX) 40 MG tablet  . metoprolol tartrate (LOPRESSOR) 25 MG tablet  . Multiple Vitamins-Minerals (ADULT ONE DAILY GUMMIES) CHEW  . omeprazole (PRILOSEC) 20 MG capsule  . traZODone (DESYREL) 50 MG tablet  . vitamin B-12 1000 MCG tablet   No current facility-administered medications for this encounter.      Myra Gianotti, PA-C Surgical Short Stay/Anesthesiology [facility] Phone [phone] [facility] Phone [phone] 01/31/2019 2:13 PM

## 2019-02-02 ENCOUNTER — Inpatient Hospital Stay (HOSPITAL_COMMUNITY): Payer: Medicare Other | Admitting: Certified Registered Nurse Anesthetist

## 2019-02-02 ENCOUNTER — Inpatient Hospital Stay (HOSPITAL_COMMUNITY): Payer: Medicare Other | Admitting: Vascular Surgery

## 2019-02-02 ENCOUNTER — Encounter (HOSPITAL_COMMUNITY)
Admission: RE | Disposition: A | Discharge status: Home / Self Care | Payer: Self-pay | Source: Home / Self Care | Attending: Cardiovascular Disease

## 2019-02-02 ENCOUNTER — Inpatient Hospital Stay (HOSPITAL_COMMUNITY): Payer: Medicare Other

## 2019-02-02 ENCOUNTER — Encounter (HOSPITAL_COMMUNITY): Payer: Self-pay

## 2019-02-02 ENCOUNTER — Other Ambulatory Visit: Payer: Self-pay

## 2019-02-02 ENCOUNTER — Inpatient Hospital Stay (HOSPITAL_COMMUNITY)
Admission: RE | Admit: 2019-02-02 | Discharge: 2019-02-03 | DRG: 266 | Disposition: A | Discharge status: Home / Self Care | Payer: Medicare Other | Attending: Cardiovascular Disease | Admitting: Cardiovascular Disease

## 2019-02-02 DIAGNOSIS — I340 Nonrheumatic mitral (valve) insufficiency: Secondary | ICD-10-CM | POA: Diagnosis not present

## 2019-02-02 DIAGNOSIS — D638 Anemia in other chronic diseases classified elsewhere: Secondary | ICD-10-CM | POA: Diagnosis present

## 2019-02-02 DIAGNOSIS — D539 Nutritional anemia, unspecified: Secondary | ICD-10-CM | POA: Diagnosis not present

## 2019-02-02 DIAGNOSIS — J45909 Unspecified asthma, uncomplicated: Secondary | ICD-10-CM | POA: Diagnosis present

## 2019-02-02 DIAGNOSIS — E785 Hyperlipidemia, unspecified: Secondary | ICD-10-CM | POA: Diagnosis present

## 2019-02-02 DIAGNOSIS — Z20828 Contact with and (suspected) exposure to other viral communicable diseases: Secondary | ICD-10-CM | POA: Diagnosis present

## 2019-02-02 DIAGNOSIS — Z006 Encounter for examination for normal comparison and control in clinical research program: Secondary | ICD-10-CM

## 2019-02-02 DIAGNOSIS — I130 Hypertensive heart and chronic kidney disease with heart failure and stage 1 through stage 4 chronic kidney disease, or unspecified chronic kidney disease: Secondary | ICD-10-CM | POA: Diagnosis present

## 2019-02-02 DIAGNOSIS — Z79899 Other long term (current) drug therapy: Secondary | ICD-10-CM | POA: Diagnosis not present

## 2019-02-02 DIAGNOSIS — E538 Deficiency of other specified B group vitamins: Secondary | ICD-10-CM | POA: Diagnosis present

## 2019-02-02 DIAGNOSIS — Z87891 Personal history of nicotine dependence: Secondary | ICD-10-CM | POA: Diagnosis not present

## 2019-02-02 DIAGNOSIS — E669 Obesity, unspecified: Secondary | ICD-10-CM | POA: Diagnosis present

## 2019-02-02 DIAGNOSIS — Z6831 Body mass index (BMI) 31.0-31.9, adult: Secondary | ICD-10-CM | POA: Diagnosis not present

## 2019-02-02 DIAGNOSIS — I5042 Chronic combined systolic (congestive) and diastolic (congestive) heart failure: Secondary | ICD-10-CM | POA: Diagnosis present

## 2019-02-02 DIAGNOSIS — I110 Hypertensive heart disease with heart failure: Secondary | ICD-10-CM | POA: Diagnosis not present

## 2019-02-02 DIAGNOSIS — E876 Hypokalemia: Secondary | ICD-10-CM | POA: Diagnosis present

## 2019-02-02 DIAGNOSIS — N183 Chronic kidney disease, stage 3 unspecified: Secondary | ICD-10-CM | POA: Diagnosis present

## 2019-02-02 DIAGNOSIS — I5043 Acute on chronic combined systolic (congestive) and diastolic (congestive) heart failure: Secondary | ICD-10-CM | POA: Diagnosis present

## 2019-02-02 DIAGNOSIS — F01518 Vascular dementia, unspecified severity, with other behavioral disturbance: Secondary | ICD-10-CM | POA: Diagnosis present

## 2019-02-02 DIAGNOSIS — Z9889 Other specified postprocedural states: Secondary | ICD-10-CM

## 2019-02-02 DIAGNOSIS — N1832 Chronic kidney disease, stage 3b: Secondary | ICD-10-CM | POA: Diagnosis present

## 2019-02-02 DIAGNOSIS — I10 Essential (primary) hypertension: Secondary | ICD-10-CM | POA: Diagnosis present

## 2019-02-02 DIAGNOSIS — Z8719 Personal history of other diseases of the digestive system: Secondary | ICD-10-CM | POA: Diagnosis not present

## 2019-02-02 DIAGNOSIS — F0151 Vascular dementia with behavioral disturbance: Secondary | ICD-10-CM | POA: Diagnosis present

## 2019-02-02 DIAGNOSIS — D509 Iron deficiency anemia, unspecified: Secondary | ICD-10-CM | POA: Diagnosis present

## 2019-02-02 DIAGNOSIS — E7800 Pure hypercholesterolemia, unspecified: Secondary | ICD-10-CM | POA: Diagnosis present

## 2019-02-02 DIAGNOSIS — I672 Cerebral atherosclerosis: Secondary | ICD-10-CM | POA: Diagnosis present

## 2019-02-02 DIAGNOSIS — Z8249 Family history of ischemic heart disease and other diseases of the circulatory system: Secondary | ICD-10-CM

## 2019-02-02 DIAGNOSIS — K219 Gastro-esophageal reflux disease without esophagitis: Secondary | ICD-10-CM | POA: Diagnosis present

## 2019-02-02 DIAGNOSIS — I428 Other cardiomyopathies: Secondary | ICD-10-CM | POA: Diagnosis present

## 2019-02-02 DIAGNOSIS — I509 Heart failure, unspecified: Secondary | ICD-10-CM | POA: Diagnosis not present

## 2019-02-02 HISTORY — DX: Nonrheumatic mitral (valve) insufficiency: I34.0

## 2019-02-02 HISTORY — PX: MITRAL VALVE REPAIR: CATH118311

## 2019-02-02 HISTORY — DX: Other specified postprocedural states: Z98.890

## 2019-02-02 HISTORY — DX: Chronic kidney disease, stage 3 unspecified: N18.30

## 2019-02-02 HISTORY — DX: Chronic diastolic (congestive) heart failure: I50.32

## 2019-02-02 HISTORY — DX: Hyperlipidemia, unspecified: E78.5

## 2019-02-02 HISTORY — DX: Anemia in other chronic diseases classified elsewhere: D63.8

## 2019-02-02 HISTORY — DX: Left bundle-branch block, unspecified: I44.7

## 2019-02-02 LAB — CBC
HCT: 28.8 % — ABNORMAL LOW (ref 36.0–46.0)
Hemoglobin: 9 g/dL — ABNORMAL LOW (ref 12.0–15.0)
MCH: 31.4 pg (ref 26.0–34.0)
MCHC: 31.3 g/dL (ref 30.0–36.0)
MCV: 100.3 fL — ABNORMAL HIGH (ref 80.0–100.0)
Platelets: 211 10*3/uL (ref 150–400)
RBC: 2.87 MIL/uL — ABNORMAL LOW (ref 3.87–5.11)
RDW: 16.9 % — ABNORMAL HIGH (ref 11.5–15.5)
WBC: 6.7 10*3/uL (ref 4.0–10.5)
nRBC: 0 % (ref 0.0–0.2)

## 2019-02-02 LAB — POCT ACTIVATED CLOTTING TIME
Activated Clotting Time: 213 seconds
Activated Clotting Time: 241 seconds
Activated Clotting Time: 263 seconds

## 2019-02-02 LAB — CREATININE, SERUM
Creatinine, Ser: 1.37 mg/dL — ABNORMAL HIGH (ref 0.44–1.00)
GFR calc Af Amer: 44 mL/min — ABNORMAL LOW (ref >60)
GFR calc non Af Amer: 38 mL/min — ABNORMAL LOW (ref >60)

## 2019-02-02 LAB — POTASSIUM: Potassium: 3.2 mmol/L — ABNORMAL LOW (ref 3.5–5.1)

## 2019-02-02 SURGERY — MITRAL VALVE REPAIR
Anesthesia: General

## 2019-02-02 MED ORDER — CLOPIDOGREL BISULFATE 75 MG PO TABS
75.0000 mg | ORAL_TABLET | Freq: Every day | ORAL | Status: DC
  Administered 2019-02-03: 06:00:00 75 mg via ORAL
  Filled 2019-02-02 (×2): qty 1

## 2019-02-02 MED ORDER — VANCOMYCIN HCL IN DEXTROSE 1-5 GM/200ML-% IV SOLN
INTRAVENOUS | Status: AC
  Filled 2019-02-02: qty 200

## 2019-02-02 MED ORDER — PROPOFOL 10 MG/ML IV BOLUS
INTRAVENOUS | Status: DC | PRN
  Administered 2019-02-02: 20 mg via INTRAVENOUS
  Administered 2019-02-02: 100 mg via INTRAVENOUS

## 2019-02-02 MED ORDER — FERROUS SULFATE 325 (65 FE) MG PO TABS
324.0000 mg | ORAL_TABLET | Freq: Every day | ORAL | Status: DC
  Administered 2019-02-03: 06:00:00 324 mg via ORAL
  Filled 2019-02-02 (×2): qty 1

## 2019-02-02 MED ORDER — ATORVASTATIN CALCIUM 40 MG PO TABS
40.0000 mg | ORAL_TABLET | Freq: Every day | ORAL | Status: DC
  Administered 2019-02-03: 40 mg via ORAL
  Filled 2019-02-02: qty 1

## 2019-02-02 MED ORDER — PANTOPRAZOLE SODIUM 40 MG PO TBEC
40.0000 mg | DELAYED_RELEASE_TABLET | Freq: Every day | ORAL | Status: DC
  Administered 2019-02-02 – 2019-02-03 (×2): 40 mg via ORAL
  Filled 2019-02-02 (×2): qty 1

## 2019-02-02 MED ORDER — CHLORHEXIDINE GLUCONATE 4 % EX LIQD: 30.0000 mL | CUTANEOUS | Status: DC

## 2019-02-02 MED ORDER — SODIUM CHLORIDE 0.9% FLUSH
3.0000 mL | Freq: Two times a day (BID) | INTRAVENOUS | Status: DC
  Administered 2019-02-02 (×2): 3 mL via INTRAVENOUS

## 2019-02-02 MED ORDER — ESMOLOL HCL 100 MG/10ML IV SOLN
INTRAVENOUS | Status: DC | PRN
  Administered 2019-02-02: 30 mg via INTRAVENOUS

## 2019-02-02 MED ORDER — ENOXAPARIN SODIUM 40 MG/0.4ML ~~LOC~~ SOLN
40.0000 mg | SUBCUTANEOUS | Status: DC
  Administered 2019-02-03: 40 mg via SUBCUTANEOUS
  Filled 2019-02-02: qty 0.4

## 2019-02-02 MED ORDER — SODIUM CHLORIDE 0.9 % IV SOLN
INTRAVENOUS | Status: AC
  Filled 2019-02-02: qty 1.5

## 2019-02-02 MED ORDER — CHLORHEXIDINE GLUCONATE 4 % EX LIQD: 60.0000 mL | Freq: Once | CUTANEOUS | Status: DC

## 2019-02-02 MED ORDER — LIDOCAINE 2% (20 MG/ML) 5 ML SYRINGE
INTRAMUSCULAR | Status: DC | PRN
  Administered 2019-02-02: 40 mg via INTRAVENOUS

## 2019-02-02 MED ORDER — HEPARIN (PORCINE) IN NACL 2000-0.9 UNIT/L-% IV SOLN
INTRAVENOUS | Status: DC | PRN
  Administered 2019-02-02 (×3): 1000 mL

## 2019-02-02 MED ORDER — ASPIRIN 81 MG PO CHEW
81.0000 mg | CHEWABLE_TABLET | Freq: Every day | ORAL | Status: DC
  Administered 2019-02-02 – 2019-02-03 (×2): 81 mg via ORAL
  Filled 2019-02-02 (×2): qty 1

## 2019-02-02 MED ORDER — ROCURONIUM BROMIDE 10 MG/ML (PF) SYRINGE
PREFILLED_SYRINGE | INTRAVENOUS | Status: DC | PRN
  Administered 2019-02-02: 10 mg via INTRAVENOUS
  Administered 2019-02-02: 60 mg via INTRAVENOUS
  Administered 2019-02-02: 20 mg via INTRAVENOUS

## 2019-02-02 MED ORDER — GERHARDT'S BUTT CREAM
TOPICAL_CREAM | CUTANEOUS | Status: DC | PRN
  Filled 2019-02-02 (×2): qty 1

## 2019-02-02 MED ORDER — SODIUM CHLORIDE 0.9 % IV SOLN
INTRAVENOUS | Status: DC | PRN
  Administered 2019-02-02: 08:00:00 20 ug/min via INTRAVENOUS

## 2019-02-02 MED ORDER — SODIUM CHLORIDE 0.9% FLUSH: 3.0000 mL | INTRAVENOUS | Status: DC | PRN

## 2019-02-02 MED ORDER — PROTAMINE SULFATE 10 MG/ML IV SOLN
INTRAVENOUS | Status: DC | PRN
  Administered 2019-02-02: 20 mg via INTRAVENOUS
  Administered 2019-02-02: 10 mg via INTRAVENOUS
  Administered 2019-02-02: 20 mg via INTRAVENOUS

## 2019-02-02 MED ORDER — ONDANSETRON HCL 4 MG/2ML IJ SOLN: 4.0000 mg | Freq: Four times a day (QID) | INTRAMUSCULAR | Status: DC | PRN

## 2019-02-02 MED ORDER — CHLORHEXIDINE GLUCONATE 0.12 % MT SOLN
15.0000 mL | Freq: Once | OROMUCOSAL | Status: AC
  Administered 2019-02-02: 06:00:00 15 mL via OROMUCOSAL

## 2019-02-02 MED ORDER — VANCOMYCIN HCL 1000 MG IV SOLR
INTRAVENOUS | Status: DC | PRN
  Administered 2019-02-02: 07:00:00 1000 mg via INTRAVENOUS

## 2019-02-02 MED ORDER — TRAZODONE HCL 50 MG PO TABS
25.0000 mg | ORAL_TABLET | Freq: Every evening | ORAL | Status: DC | PRN
  Administered 2019-02-02: 25 mg via ORAL
  Filled 2019-02-02: qty 1

## 2019-02-02 MED ORDER — HEPARIN (PORCINE) IN NACL 1000-0.9 UT/500ML-% IV SOLN
INTRAVENOUS | Status: DC | PRN
  Administered 2019-02-02: 500 mL

## 2019-02-02 MED ORDER — HEPARIN SODIUM (PORCINE) 1000 UNIT/ML IJ SOLN
INTRAMUSCULAR | Status: DC | PRN
  Administered 2019-02-02: 3000 [IU] via INTRAVENOUS
  Administered 2019-02-02: 2000 [IU] via INTRAVENOUS
  Administered 2019-02-02: 4000 [IU] via INTRAVENOUS
  Administered 2019-02-02: 7000 [IU] via INTRAVENOUS

## 2019-02-02 MED ORDER — SODIUM CHLORIDE 0.9 % IV SOLN: 250.0000 mL | INTRAVENOUS | Status: DC | PRN

## 2019-02-02 MED ORDER — ONDANSETRON HCL 4 MG/2ML IJ SOLN
INTRAMUSCULAR | Status: DC | PRN
  Administered 2019-02-02: 4 mg via INTRAVENOUS

## 2019-02-02 MED ORDER — POTASSIUM CHLORIDE CRYS ER 20 MEQ PO TBCR
EXTENDED_RELEASE_TABLET | ORAL | Status: AC
  Filled 2019-02-02: qty 2

## 2019-02-02 MED ORDER — POTASSIUM CHLORIDE 10 MEQ/100ML IV SOLN
INTRAVENOUS | Status: DC | PRN
  Administered 2019-02-02: 10 meq via INTRAVENOUS

## 2019-02-02 MED ORDER — ACETAMINOPHEN 325 MG PO TABS: 650.0000 mg | ORAL_TABLET | ORAL | Status: DC | PRN

## 2019-02-02 MED ORDER — LACTATED RINGERS IV SOLN
INTRAVENOUS | Status: DC | PRN
  Administered 2019-02-02: 07:00:00 via INTRAVENOUS

## 2019-02-02 MED ORDER — CHLORHEXIDINE GLUCONATE 0.12 % MT SOLN
OROMUCOSAL | Status: AC
  Administered 2019-02-02: 06:00:00 15 mL via OROMUCOSAL
  Filled 2019-02-02: qty 15

## 2019-02-02 MED ORDER — SUGAMMADEX SODIUM 200 MG/2ML IV SOLN
INTRAVENOUS | Status: DC | PRN
  Administered 2019-02-02: 150 mg via INTRAVENOUS

## 2019-02-02 MED ORDER — SODIUM CHLORIDE 0.9 % IV SOLN
1.5000 g | INTRAVENOUS | Status: AC
  Administered 2019-02-02: 08:00:00 1.5 g via INTRAVENOUS

## 2019-02-02 MED ORDER — POTASSIUM CHLORIDE CRYS ER 20 MEQ PO TBCR: 40.0000 meq | EXTENDED_RELEASE_TABLET | Freq: Once | ORAL | Status: DC

## 2019-02-02 MED ORDER — METOPROLOL TARTRATE 12.5 MG HALF TABLET
12.5000 mg | ORAL_TABLET | Freq: Two times a day (BID) | ORAL | Status: DC
  Administered 2019-02-02 – 2019-02-03 (×3): 12.5 mg via ORAL
  Filled 2019-02-02 (×3): qty 1

## 2019-02-02 MED ORDER — SODIUM CHLORIDE 0.9 % IV SOLN: INTRAVENOUS | Status: DC

## 2019-02-02 MED ORDER — FUROSEMIDE 40 MG PO TABS
60.0000 mg | ORAL_TABLET | Freq: Two times a day (BID) | ORAL | Status: DC
  Administered 2019-02-02 – 2019-02-03 (×2): 60 mg via ORAL
  Filled 2019-02-02 (×2): qty 1

## 2019-02-02 MED ORDER — FENTANYL CITRATE (PF) 250 MCG/5ML IJ SOLN
INTRAMUSCULAR | Status: DC | PRN
  Administered 2019-02-02 (×2): 50 ug via INTRAVENOUS

## 2019-02-02 MED ORDER — VANCOMYCIN HCL 10 G IV SOLR
1250.0000 mg | INTRAVENOUS | Status: DC
  Filled 2019-02-02: qty 1250

## 2019-02-02 SURGICAL SUPPLY — 19 items
CATHETER STEERABLE GUIDE (CATHETERS) ×2 IMPLANT
CLIP MITRACLIP NTR (Prosthesis & Implant Heart) ×2 IMPLANT
DEVICE CLOSURE PERCLS PRGLD 6F (VASCULAR PRODUCTS) ×2 IMPLANT
GUIDEWIRE CVD PROTRACK .25 (WIRE) ×1 IMPLANT
GUIDWIRE CVD PROTRACK .25 (WIRE) ×2
KIT DILATOR VASC 18G NDL (KITS) ×2 IMPLANT
KIT HEART LEFT (KITS) ×4 IMPLANT
KIT MICROPUNCTURE NIT STIFF (SHEATH) ×2 IMPLANT
KIT VERSACROSS FXD (WIRE) ×2 IMPLANT
MITRACLIP 1 SGC 1 NTR MBR0101 (KITS) ×2 IMPLANT
PACK CARDIAC CATHETERIZATION (CUSTOM PROCEDURE TRAY) ×2 IMPLANT
PERCLOSE PROGLIDE 6F (VASCULAR PRODUCTS) ×4
SHEATH PINNACLE 8F 10CM (SHEATH) ×2 IMPLANT
SHEATH PROBE COVER 6X72 (BAG) ×2 IMPLANT
STOPCOCK MORSE 400PSI 3WAY (MISCELLANEOUS) ×14 IMPLANT
TRANSDUCER W/STOPCOCK (MISCELLANEOUS) ×4 IMPLANT
TUBING ART PRESS 72  MALE/FEM (TUBING) ×2
TUBING ART PRESS 72 MALE/FEM (TUBING) ×2 IMPLANT
TUBING CIL FLEX 10 FLL-RA (TUBING) ×2 IMPLANT

## 2019-02-02 NOTE — Anesthesia Procedure Notes (Signed)
Procedure Name: Intubation Date/Time: 02/02/2019 7:59 AM Performed by: Harden Mo, CRNA Pre-anesthesia Checklist: Patient identified, Emergency Drugs available, Suction available and Patient being monitored Patient Re-evaluated:Patient Re-evaluated prior to induction Oxygen Delivery Method: Circle System Utilized Preoxygenation: Pre-oxygenation with 100% oxygen Induction Type: IV induction Ventilation: Mask ventilation without difficulty and Oral airway inserted - appropriate to patient size Laryngoscope Size: Sabra Heck and 2 Grade View: Grade I Tube type: Oral Tube size: 7.0 mm Number of attempts: 1 Airway Equipment and Method: Stylet and Oral airway Placement Confirmation: ETT inserted through vocal cords under direct vision,  positive ETCO2 and breath sounds checked- equal and bilateral Secured at: 22 cm Tube secured with: Tape Dental Injury: Teeth and Oropharynx as per pre-operative assessment

## 2019-02-02 NOTE — Anesthesia Preprocedure Evaluation (Addendum)
Anesthesia Evaluation  Patient identified by MRN, date of birth, ID band Patient awake    Reviewed: Allergy & Precautions, NPO status , Patient's Chart, lab work & pertinent test results  Airway Mallampati: I  TM Distance: >3 FB Neck ROM: Full    Dental  (+) Teeth Intact, Dental Advisory Given   Pulmonary former smoker,    breath sounds clear to auscultation       Cardiovascular hypertension, +CHF  + Valvular Problems/Murmurs MR  Rhythm:Regular Rate:Normal + Systolic murmurs    Neuro/Psych Anxiety Dementia negative neurological ROS     GI/Hepatic Neg liver ROS, GERD  ,  Endo/Other  negative endocrine ROS  Renal/GU Renal InsufficiencyRenal disease     Musculoskeletal  (+) Arthritis ,   Abdominal   Peds  Hematology   Anesthesia Other Findings   Reproductive/Obstetrics                            Lab Results  Component Value Date   WBC 8.5 01/30/2019   HGB 9.5 (L) 01/30/2019   HCT 31.3 (L) 01/30/2019   MCV 102.3 (H) 01/30/2019   PLT 237 01/30/2019   Lab Results  Component Value Date   CREATININE 1.22 (H) 01/30/2019   BUN 27 (H) 01/30/2019   NA 136 01/30/2019   K 3.2 (L) 02/02/2019   CL 98 01/30/2019   CO2 23 01/30/2019   Lab Results  Component Value Date   INR 1.1 01/30/2019   INR 1.2 10/27/2018   Echo: 1. The left ventricle has low normal systolic function, with an ejection fraction of 50-55%. No evidence of left ventricular regional wall motion abnormalities.  2. The right ventricle has normal systolc function. The cavity was normal.  3. Left atrial size was severely dilated.  4. No evidence of a thrombus present in the left atrial appendage.  5. Trivial pericardial effusion is present.  6. The mitral valve is abnormal. Mild thickening of the mitral valve leaflet. Mitral valve regurgitation is severe by color flow Doppler.  7. The aortic valve is tricuspid No stenosis of the  aortic valve.  8. There is evidence of moderate plaque in the descending aorta.  9. Low normal LV systolic function; posterior MV leaflet appears to be restricted with severe MR; severe LAE.  Anesthesia Physical Anesthesia Plan  ASA: IV  Anesthesia Plan: General   Post-op Pain Management:    Induction: Intravenous  PONV Risk Score and Plan: 4 or greater and Ondansetron and Treatment may vary due to age or medical condition  Airway Management Planned: Oral ETT  Additional Equipment: Arterial line  Intra-op Plan:   Post-operative Plan: Extubation in OR  Informed Consent: I have reviewed the patients History and Physical, chart, labs and discussed the procedure including the risks, benefits and alternatives for the proposed anesthesia with the patient or authorized representative who has indicated his/her understanding and acceptance.     Consent reviewed with POA  Plan Discussed with: CRNA  Anesthesia Plan Comments:        Anesthesia Quick Evaluation

## 2019-02-02 NOTE — Progress Notes (Signed)
  Echocardiogram Echocardiogram Transesophageal with 3D  has been performed.  Hannah [name] M 02/02/2019, 10:32 AM

## 2019-02-02 NOTE — Anesthesia Postprocedure Evaluation (Signed)
Anesthesia Post Note  Patient: Hannah [name]  Procedure(s) Performed: MITRAL VALVE REPAIR (N/A )     Patient location during evaluation: PACU Anesthesia Type: General Level of consciousness: awake and alert Pain management: pain level controlled Vital Signs Assessment: post-procedure vital signs reviewed and stable Respiratory status: spontaneous breathing, nonlabored ventilation, respiratory function stable and patient connected to nasal cannula oxygen Cardiovascular status: blood pressure returned to baseline and stable Postop Assessment: no apparent nausea or vomiting Anesthetic complications: no    Last Vitals:  Vitals:   02/02/19 1140 02/02/19 1248  BP: (!) 144/47 (!) 116/53  Pulse: (!) 102 96  Resp: (!) 21   Temp:    SpO2: 100%     Last Pain:  Vitals:   02/02/19 1140  TempSrc:   PainSc: 0-No pain                 Effie Berkshire

## 2019-02-02 NOTE — Progress Notes (Signed)
Rt radial arterial line d/c'ed, manual pressure x 5 minutes, gauze and tegaderm dressing, rt radial pulse palpable, level 0.

## 2019-02-02 NOTE — Interval H&P Note (Signed)
History and Physical Interval Note:  02/02/2019 7:43 AM  Hannah [name]  has presented today for surgery, with the diagnosis of Severe Mitral Insufficiency.  The various methods of treatment have been discussed with the patient and family. After consideration of risks, benefits and other options for treatment, the patient has consented to  Procedure(s): MITRAL VALVE REPAIR (N/A) as a surgical intervention.  The patient's history has been reviewed, patient examined, no change in status, stable for surgery.  I have reviewed the patient's chart and labs.  Questions were answered to the patient's satisfaction.    The patient continues to complain of NYHA III symptoms of acute on chronic combined systolic and diastolic heart failure but no change since her evaluation with Dr Roxy Manns.   Sherren Mocha

## 2019-02-02 NOTE — Anesthesia Procedure Notes (Signed)
Arterial Line Insertion Start/End10/15/2020 7:15 AM, 02/02/2019 7:30 AM Performed by: Verdie Drown, CRNA, CRNA  Patient location: Pre-op. Preanesthetic checklist: patient identified, IV checked, site marked, risks and benefits discussed, surgical consent, monitors and equipment checked, pre-op evaluation and anesthesia consent Lidocaine 1% used for infiltration Right, radial was placed Catheter size: 20 G Hand hygiene performed  and maximum sterile barriers used  Allen's test indicative of satisfactory collateral circulation Attempts: 2 Procedure performed without using ultrasound guided technique. Ultrasound Notes:anatomy identified, needle tip was noted to be adjacent to the nerve/plexus identified and no ultrasound evidence of intravascular and/or intraneural injection Following insertion, dressing applied and Biopatch. Post procedure assessment: normal  Patient tolerated the procedure well with no immediate complications.

## 2019-02-02 NOTE — Transfer of Care (Signed)
Immediate Anesthesia Transfer of Care Note  Patient: Hannah [name]  Procedure(s) Performed: MITRAL VALVE REPAIR (N/A )  Patient Location: Cath Lab  Anesthesia Type:General  Level of Consciousness: awake and alert   Airway & Oxygen Therapy: Patient Spontanous Breathing and Patient connected to nasal cannula oxygen  Post-op Assessment: Report given to RN, Post -op Vital signs reviewed and stable and Patient moving all extremities X 4  Post vital signs: Reviewed and stable  Last Vitals:  Vitals Value Taken Time  BP 136/48 02/02/19 1045  Temp 36.4 C 02/02/19 1045  Pulse 96 02/02/19 1048  Resp 15 02/02/19 1048  SpO2 100 % 02/02/19 1048  Vitals shown include unvalidated device data.  Last Pain:  Vitals:   02/02/19 1045  TempSrc: Temporal  PainSc: Asleep         Complications: No apparent anesthesia complications

## 2019-02-02 NOTE — Plan of Care (Signed)
  Problem: Clinical Measurements: Goal: Cardiovascular complication will be avoided Outcome: Progressing   Problem: Nutrition: Goal: Adequate nutrition will be maintained Outcome: Progressing   Problem: Coping: Goal: Level of anxiety will decrease Outcome: Progressing   Problem: Elimination: Goal: Will not experience complications related to urinary retention Outcome: Progressing   Problem: Pain Managment: Goal: General experience of comfort will improve Outcome: Progressing   Problem: Safety: Goal: Ability to remain free from injury will improve Outcome: Progressing

## 2019-02-03 ENCOUNTER — Encounter (HOSPITAL_COMMUNITY): Payer: Self-pay | Admitting: Cardiovascular Disease

## 2019-02-03 ENCOUNTER — Other Ambulatory Visit: Payer: Self-pay | Admitting: Physician Assistant

## 2019-02-03 ENCOUNTER — Inpatient Hospital Stay (HOSPITAL_COMMUNITY): Payer: Medicare Other

## 2019-02-03 ENCOUNTER — Encounter: Payer: Self-pay | Admitting: Family Medicine

## 2019-02-03 DIAGNOSIS — Z9889 Other specified postprocedural states: Secondary | ICD-10-CM

## 2019-02-03 DIAGNOSIS — Z006 Encounter for examination for normal comparison and control in clinical research program: Secondary | ICD-10-CM | POA: Diagnosis not present

## 2019-02-03 DIAGNOSIS — E876 Hypokalemia: Secondary | ICD-10-CM

## 2019-02-03 DIAGNOSIS — I130 Hypertensive heart and chronic kidney disease with heart failure and stage 1 through stage 4 chronic kidney disease, or unspecified chronic kidney disease: Secondary | ICD-10-CM | POA: Diagnosis not present

## 2019-02-03 DIAGNOSIS — I340 Nonrheumatic mitral (valve) insufficiency: Secondary | ICD-10-CM

## 2019-02-03 DIAGNOSIS — I5043 Acute on chronic combined systolic (congestive) and diastolic (congestive) heart failure: Secondary | ICD-10-CM | POA: Diagnosis not present

## 2019-02-03 LAB — TYPE AND SCREEN
ABO/RH(D): O NEG
Antibody Screen: POSITIVE
Unit division: 0
Unit division: 0

## 2019-02-03 LAB — BASIC METABOLIC PANEL
Anion gap: 12 (ref 5–15)
BUN: 25 mg/dL — ABNORMAL HIGH (ref 8–23)
CO2: 26 mmol/L (ref 22–32)
Calcium: 8 mg/dL — ABNORMAL LOW (ref 8.9–10.3)
Chloride: 99 mmol/L (ref 98–111)
Creatinine, Ser: 1.43 mg/dL — ABNORMAL HIGH (ref 0.44–1.00)
GFR calc Af Amer: 41 mL/min — ABNORMAL LOW (ref >60)
GFR calc non Af Amer: 36 mL/min — ABNORMAL LOW (ref >60)
Glucose, Bld: 118 mg/dL — ABNORMAL HIGH (ref 70–99)
Potassium: 3.2 mmol/L — ABNORMAL LOW (ref 3.5–5.1)
Sodium: 137 mmol/L (ref 135–145)

## 2019-02-03 LAB — BPAM RBC
Blood Product Expiration Date: 202011162359
Blood Product Expiration Date: 202011162359
Unit Type and Rh: 9500
Unit Type and Rh: 9500

## 2019-02-03 LAB — CBC
HCT: 27.8 % — ABNORMAL LOW (ref 36.0–46.0)
Hemoglobin: 8.6 g/dL — ABNORMAL LOW (ref 12.0–15.0)
MCH: 30.9 pg (ref 26.0–34.0)
MCHC: 30.9 g/dL (ref 30.0–36.0)
MCV: 100 fL (ref 80.0–100.0)
Platelets: 213 10*3/uL (ref 150–400)
RBC: 2.78 MIL/uL — ABNORMAL LOW (ref 3.87–5.11)
RDW: 17 % — ABNORMAL HIGH (ref 11.5–15.5)
WBC: 7.2 10*3/uL (ref 4.0–10.5)
nRBC: 0 % (ref 0.0–0.2)

## 2019-02-03 LAB — ECHOCARDIOGRAM LIMITED
Height: 61.5 in
Weight: 2680.02 oz

## 2019-02-03 MED ORDER — LOSARTAN POTASSIUM 25 MG PO TABS: 25.0000 mg | ORAL_TABLET | Freq: Every day | ORAL | 11 refills | Status: DC

## 2019-02-03 MED ORDER — METOPROLOL SUCCINATE ER 25 MG PO TB24: 25.0000 mg | ORAL_TABLET | Freq: Every day | ORAL | 3 refills | Status: DC

## 2019-02-03 MED ORDER — ASPIRIN 81 MG PO CHEW: 81.0000 mg | CHEWABLE_TABLET | Freq: Every day | ORAL | Status: DC

## 2019-02-03 MED ORDER — PANTOPRAZOLE SODIUM 40 MG PO TBEC: 40.0000 mg | DELAYED_RELEASE_TABLET | Freq: Every day | ORAL | 1 refills | Status: DC

## 2019-02-03 MED ORDER — CLOPIDOGREL BISULFATE 75 MG PO TABS: 75.0000 mg | ORAL_TABLET | Freq: Every day | ORAL | 1 refills | Status: DC

## 2019-02-03 NOTE — Progress Notes (Signed)
  Echocardiogram 2D Echocardiogram limited post clip has been performed.  Hannah [name] M 02/03/2019, 9:20 AM

## 2019-02-03 NOTE — Plan of Care (Signed)
  Problem: Education: Goal: Knowledge of General Education information will improve Description: Including pain rating scale, medication(s)/side effects and non-pharmacologic comfort measures Outcome: Adequate for Discharge   Problem: Health Behavior/Discharge Planning: Goal: Ability to manage health-related needs will improve Outcome: Adequate for Discharge   Problem: Clinical Measurements: Goal: Ability to maintain clinical measurements within normal limits will improve Outcome: Adequate for Discharge Goal: Will remain free from infection Outcome: Adequate for Discharge Goal: Diagnostic test results will improve Outcome: Adequate for Discharge Goal: Respiratory complications will improve Outcome: Adequate for Discharge Goal: Cardiovascular complication will be avoided Outcome: Adequate for Discharge   Problem: Activity: Goal: Risk for activity intolerance will decrease Outcome: Adequate for Discharge   Problem: Nutrition: Goal: Adequate nutrition will be maintained Outcome: Adequate for Discharge   Problem: Coping: Goal: Level of anxiety will decrease Outcome: Adequate for Discharge   Problem: Elimination: Goal: Will not experience complications related to bowel motility Outcome: Adequate for Discharge Goal: Will not experience complications related to urinary retention Outcome: Adequate for Discharge   Problem: Pain Managment: Goal: General experience of comfort will improve Outcome: Adequate for Discharge   Problem: Safety: Goal: Ability to remain free from injury will improve Outcome: Adequate for Discharge   Problem: Skin Integrity: Goal: Risk for impaired skin integrity will decrease Outcome: Adequate for Discharge  Discharge instructions given to patient. Questions answered. Educated on new medication regimen, signs/symptoms, restrictions, and follow up appointments. Prescriptions sent to [facility] on [address].Marland Kitchen PIV DC, hemostasis achieved. Vital signs stable.  All belongings sent home with patient.   Pt escorted by NT via wheelchair to private vehicle driven by daughter.

## 2019-02-03 NOTE — Discharge Summary (Addendum)
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 ([place])   Acute on chronic combined systolic and diastolic CHF (congestive heart failure) (HCC)   Mitral valve insufficiency   CKD (chronic kidney disease) stage 3, GFR 30-59 ml/min   Anemia of chronic disease   Nonrheumatic mitral valve regurgitation   Hypokalemia   Allergies No Known Allergies  Diagnostic Studies/Procedures    02/02/19 MITRAL VALVE REPAIR  Conclusion Successful transcatheter edge-to-edge mitral valve repair with a single MitraClip NTR placed A2/P2, reducing MR from severe at baseline to 1-2+, LA V wave reduced by 50% from baseline  Recommendations  Antiplatelet/Anticoag Recommend uninterrupted dual antiplatelet therapy with Aspirin 81mg  daily and Clopidogrel 75mg  daily for 6 months.    _____________    Echo 02/03/19:  IMPRESSIONS  1. Left ventricular ejection fraction, by visual estimation, is 30 to 35%. The left ventricle has moderately decreased function. Left ventricular septal wall thickness was normal. There is mildly increased left ventricular hypertrophy.  2. Left ventricular diastolic function could not be evaluated pattern of LV diastolic filling.  3. Global right ventricle has normal systolic function.The right ventricular size is normal. No increase in right ventricular wall thickness.  4. Left atrial size was severely dilated.  5. Right atrial size was mildly dilated.  6. Trivial pericardial effusion is present.  7. The mitral valve has been repaired/replaced.  Mild mitral valve regurgitation. Moderate mitral stenosis.  8. The tricuspid valve is normal in structure. Tricuspid valve regurgitation is mild.  9. The aortic valve is normal in structure. Aortic valve regurgitation was not visualized by color flow Doppler. 10. The pulmonic valve was not assessed. Pulmonic valve regurgitation was not assessed by color flow Doppler. 11. Aortic root could not be assessed. 12. Day 1 post MitraClip placement. Mitral regurgitation is mild. Transmitral gradients are elevated at 7 mmHg. LVEF decreased from prior the procedure from 50-55% to 30-35% with paradoxical septal motion. No pericardial effusion. 13. Iatrogenic left to right shunting.   History of Present Illness     Hannah [name] is a 73 y.o. female with a history of chronic diastolic CHF, HTN, dementia, GI bleeding, anxiety and severe mitral regurgitation who presented to [facility] on 02/02/19 for planned MitraClip.  Patient and her family deny any long history of cardiac disease or heart murmur. The patient has developed memory loss and anxiety which has slowly progressed over the last few years.  She lives locally in [place] with 1 of her daughters and has remained functionally independent for the most part.  However, she stopped driving her automobile more than 2 years ago.  She has reportedly developed progressive exertional shortness of breath and anxiety.  Since early July she has been seen in the emergency department 11 times and hospitalized on 5 different occasions with acute exacerbation of congestive heart failure and pulmonary edema. At the time of her initial presentation she was notably anemic with active GI bleeding for which she underwent mesenteric artery coil embolization. Echocardiogram performed at that time revealed normal left ventricular systolic function with moderate mitral regurgitation.  Most recently she was hospitalized with another exacerbation of congestive heart failure and  transesophageal echocardiogram revealed low normal left ventricular systolic function with severe mitral regurgitation.  Diagnostic cardiac catheterization was notable for the absence of significant coronary artery disease and moderate pulmonary hypertension.   The patient has been evaluated by the multidisciplinary valve team and felt to have severe, symptomatic mitral regurgitation and to be a suitable candidate for transcatheter edge to edge mitral valve repair, which was set up for 02/02/19.    Hospital Course     Consultants: none  Severe MR: she underwent successful transcatheter edge-to-edge mitral valve repair with a single MitraClip NTR placed A2/P2, reducing MR from severe at baseline to 1-2+, LA V wave reduced by 50% from baseline. Groin site is healing well. Post op echo showed EF 30-35%, with mild residual MR and moderate MS (transmitral gradient 7 mm HG). She was placed on aspirin and plavix which will be continued x 6 months.   Acute on chronic combined S/D CHF: as evidenced by an elevated BNP on pre admission lab work and recurrent admissions for acute CHF exacerbations. This has been treated with MitraClip. Resume home lasix 60mg  daily at discharge.  NICM: EF down from 50-55% to 30-35% which is expected to some degree after mitral valve repair. Will change her lopressor from 25mg  BID to Toprol Xl 25mg  daily and add Losartan 25mg  daily given depressed EF.   Hypokalemia: noted on pre admission lab work. Starting her on losartan as above. This may be enough to correct her potassium. Will check a BMET next week.   CKD stage III: creat 1.43. Will follow closely with new ARB addition.   GERD: prilosec has been changed to protonix given potential drug drug interaction with plavix.   Chronic anemia with hx of GI bleed: Hg baseline ~9. Follow closely now on DAPT. Continue iron.  _____________  Discharge Vitals Blood pressure (!) 140/43, pulse (!) 102, temperature 98.3 F (36.8 C),  temperature source Oral, resp. rate 18, height 5' 1.5" (1.562 m), weight 76 kg, SpO2 96 %.  Filed Weights   02/02/19 0619  Weight: 76 kg    PHYSICAL EXAM:    GEN: Well nourished, well developed, in no acute distress HEENT: normal Neck: no JVD or masses Cardiac: RRR; no murmurs, rubs, or gallops,no edema  Respiratory:  clear to auscultation bilaterally, normal work of breathing GI: soft, nontender, nondistended, + BS MS: no deformity or atrophy Skin: warm and dry, no rash.  Groin site clear without hematoma or ecchymosis  Neuro:  Alert and Oriented x 3, Strength and sensation are intact Psych: euthymic mood, full affect   Labs & Radiologic Studies    CBC Recent Labs    02/02/19 1337 02/03/19 0123  WBC 6.7 7.2  HGB 9.0* 8.6*  HCT 28.8* 27.8*  MCV 100.3* 100.0  PLT 211 355   Basic Metabolic Panel Recent Labs    02/02/19 0609 02/02/19 1337 02/03/19 0123  NA  --   --  137  K 3.2*  --  3.2*  CL  --   --  99  CO2  --   --  26  GLUCOSE  --   --  118*  BUN  --   --  25*  CREATININE  --  1.37* 1.43*  CALCIUM  --   --  8.0*   Liver Function Tests No results for input(s): AST, ALT, ALKPHOS, BILITOT, PROT, ALBUMIN in the last 72 hours. No results for  input(s): LIPASE, AMYLASE in the last 72 hours. Cardiac Enzymes No results for input(s): CKTOTAL, CKMB, CKMBINDEX, TROPONINI in the last 72 hours. BNP Invalid input(s): POCBNP D-Dimer No results for input(s): DDIMER in the last 72 hours. Hemoglobin A1C No results for input(s): HGBA1C in the last 72 hours. Fasting Lipid Panel No results for input(s): CHOL, HDL, LDLCALC, TRIG, CHOLHDL, LDLDIRECT in the last 72 hours. Thyroid Function Tests No results for input(s): TSH, T4TOTAL, T3FREE, THYROIDAB in the last 72 hours.  Invalid input(s): FREET3 _____________  Dg Chest 2 View  Result Date: 01/31/2019 CLINICAL DATA:  Preoperative exam, mitral valve replacement scheduled EXAM: CHEST - 2 VIEW COMPARISON:  Radiograph  01/18/2019, CT 11/06/2018 FINDINGS: No focal consolidative process or convincing features of edema. No pneumothorax or visible effusion. Cardiomediastinal contours are stable from prior with a calcified aorta. No acute osseous or soft tissue abnormality. Embolization coils are again seen in the upper abdomen. IMPRESSION: No acute cardiopulmonary abnormality. Electronically Signed   By: Lovena Le M.D.   On: 01/31/2019 03:26   Dg Chest 2 View  Result Date: 01/18/2019 CLINICAL DATA:  Shortness of breath EXAM: CHEST - 2 VIEW COMPARISON:  Radiograph 12/28/2018, CT 11/06/2018 FINDINGS: Features of edema with hazy interstitial opacities, fissural and septal thickening and indistinct pulmonary vascularity. Suspect trace right effusion. Portion of the left costophrenic sulcus is collimated on frontal but may reflect small effusion on left as well. No focal consolidation. Mild cardiomegaly similar to prior. Degenerative changes are present in the imaged spine and shoulders. Vascular you will is a shin coils noted in the upper abdomen. IMPRESSION: 1. Features of interstitial pulmonary edema. No focal consolidation. 2. Suspect trace bilateral pleural effusions. 3. Stable mild cardiomegaly. Electronically Signed   By: Lovena Le M.D.   On: 01/18/2019 00:28   Disposition   Pt is being discharged home today in good condition.  Follow-up Plans & Appointments    Follow-up Information    [name], MD. Go on 02/14/2019.   Specialty: Family Medicine Why: appointment at 10:50 on 1027 with Dr. Nunzio Cobbs information: [place] [address] [phone]        Eileen Stanford, PA-C. Go on 02/09/2019.   Specialties: Cardiology, Radiology Why: @ 1:30pm, please arrive at least 10 minutes early Contact information: [place] [address] [phone]          Discharge Instructions    Amb Referral to Cardiac Rehabilitation    Complete by: As directed    Diagnosis: Valve Repair   Valve: Mitral Comment - mitra clip   After initial evaluation and assessments completed: Virtual Based Care may be provided alone or in conjunction with Phase 2 Cardiac Rehab based on patient barriers.: Yes      Discharge Medications   Allergies as of 02/03/2019   No Known Allergies     Medication List    STOP taking these medications   metoprolol tartrate 25 MG tablet Commonly known as: LOPRESSOR   omeprazole 20 MG capsule Commonly known as: PRILOSEC Replaced by: pantoprazole 40 MG tablet     TAKE these medications   acetaminophen 325 MG tablet Commonly known as: TYLENOL Take 650 mg by mouth every 6 (six) hours as needed for mild pain or headache.   Adult One Daily Gummies Chew Chew 1 tablet by mouth daily.   aspirin 81 MG chewable tablet Chew 1 tablet (81 mg total) by mouth daily. Start taking on: February 04, 2019   atorvastatin 40 MG tablet Commonly known as: LIPITOR Take 1 tablet (40 mg total) by mouth daily.   clopidogrel 75 MG tablet Commonly known as: PLAVIX Take 1 tablet (75 mg total) by mouth daily with breakfast. Start taking on: February 04, 2019   cyanocobalamin 1000 MCG tablet Take 1 tablet (1,000 mcg total) by mouth daily.   ferrous sulfate 324 MG Tbec Take 324 mg by mouth.   furosemide 40 MG tablet Commonly known as: LASIX Take 1.5 tablets (60 mg total) by mouth 2 (two) times daily.   losartan 25 MG tablet Commonly known as: Cozaar Take 1 tablet (25 mg total) by mouth daily.   metoprolol succinate 25 MG 24 hr tablet Commonly known as: Toprol XL Take 1 tablet (25 mg total) by mouth daily. Take with or immediately following a meal.   pantoprazole 40 MG tablet Commonly known as: PROTONIX Take 1 tablet (40 mg total) by mouth daily. Start taking on: February 04, 2019 Replaces: omeprazole 20 MG capsule   traZODone 50 MG tablet Commonly known as: DESYREL Take 0.5 tablets (25 mg total)  by mouth at bedtime as needed for sleep.         Outstanding Labs/Studies   BMET & CBC  Duration of Discharge Encounter   Greater than 30 minutes including physician time.  Signed, Sherren Mocha, MD 02/03/2019, 4:59 PM [phone]  Patient seen, examined. Available data reviewed. Agree with findings, assessment, and plan as outlined by Nell Range, PA-C. On my exam: Vitals:   02/03/19 0330 02/03/19 0744  BP: (!) 122/53 (!) 140/43  Pulse: (!) 102   Resp: 18   Temp: 99.3 F (37.4 C) 98.3 F (36.8 C)  SpO2: 96%    Pt is alert and oriented, NAD HEENT: normal Neck: JVP - normal, carotids 2+=  Lungs: CTA bilaterally CV: RRR without murmur or gallop Abd: soft, NT, Positive BS, no hepatomegaly Ext: no C/C/E, distal pulses intact and equal. Right groin site clear Skin: warm/dry no rash Tele shows NSR with single PVC's, no sustained arrhythmia Echo images reviewed and demonstrates moderate-severe LV dysfunction and only mild residual MR. Will make medication changes for guideline directed medical therapy as outlined above and otherwise patient is stable for discharge this morning.   Sherren Mocha, M.D. 02/03/2019 5:00 PM

## 2019-02-03 NOTE — Progress Notes (Signed)
CARDIAC REHAB PHASE I   PRE:  Rate/Rhythm: 105 ST  BP:  Supine:   Sitting: 133/47  Standing:    SaO2: 93%RA  MODE:  Ambulation: 350 ft   POST:  Rate/Rhythm: 122 ST  BP:  Supine: 97/63, 120/56  Sitting:   Standing:    SaO2: 94%RA 0915-1000 Pt assisted to bathroom and then walked 350 ft on RA with rolling walker. Used walker since pt stated her legs felt a little weak. She stated legs felt better as she walked. Has walker available at home to use as needed. Encouraged her to use walker and walk with daughter until she feels steady. Gave pt heart healthy diet and discussed walking for ex. Pt tolerated walk without c/o dizziness. BP lower right after walk but next BP higher. Back to bed with bed alarm. Pt alert and oriented but repeated self at times. Pt stated would like to consider CRP 2. Referral made to [facility].   Graylon Good, RN BSN  02/03/2019 9:53 AM

## 2019-02-03 NOTE — Discharge Instructions (Signed)
Prilosec has been changed to Protonix given potential drug drug interaction with plavix. When you come off plavix you can resume Prilosec for your reflux.   Groin Site Care Refer to this sheet in the next few weeks. These instructions provide you with information on caring for yourself after your procedure. Your caregiver may also give you more specific instructions. Your treatment has been planned according to current medical practices, but problems sometimes occur. Call your caregiver if you have any problems or questions after your procedure. HOME CARE INSTRUCTIONS  You may shower 24 hours after the procedure. Remove the bandage (dressing) and gently wash the site with plain soap and water. Gently pat the site dry.   Do not apply powder or lotion to the site.   Do not sit in a bathtub, swimming pool, or whirlpool for 5 to 7 days.   No bending, squatting, or lifting anything over 10 pounds (4.5 kg) as directed by your caregiver.   Inspect the site at least twice daily.   Do not drive home if you are discharged the same day of the procedure. Have someone else drive you.   You may drive 24 hours after the procedure unless otherwise instructed by your caregiver.  What to expect:  Any bruising will usually fade within 1 to 2 weeks.   Blood that collects in the tissue (hematoma) may be painful to the touch. It should usually decrease in size and tenderness within 1 to 2 weeks.  SEEK IMMEDIATE MEDICAL CARE IF:  You have unusual pain at the groin site or down the affected leg.   You have redness, warmth, swelling, or pain at the groin site.   You have drainage (other than a small amount of blood on the dressing).   You have chills.   You have a fever or persistent symptoms for more than 72 hours.   You have a fever and your symptoms suddenly get worse.   Your leg becomes pale, cool, tingly, or numb.  You have heavy bleeding from the site. Hold pressure on the site.  If you have  any questions or concerns you can call the structural heart phone during normal business hours 8am-4pm. If you have an urgent need after hours or weekends please call [phone] to talk to the on call provider for general cardiology. If you have an emergency that requires immediate attention, please call 911.   After MitraClip Checklist  Check  Test Description   Follow up appointment in 1-2 weeks  Most of our patients will see our structural heart physician assistant, Nell Range or your primary cardiologist within 1-2 weeks. Your incision site will be checked and you will be cleared to drive and resume all normal activities if you are doing well.     1 month echo and follow up  You will have an echo to check on your heart valve clip and be seen back in the office by Nell Range. Many times the echo is not read by your appointment time, but Joellen Jersey will call you later that day or the following day to report your results.   Follow up with your primary cardiologist You will need to be seen by your primary cardiologist in the following 3-6 months after your 1 month appointment in the valve clinic. Often times your Plavix or Aspirin will be discontinued during this time, but this is decided on a case by case basis.    1 year echo and follow up You will have  another echo to check on your heart valve after 1 year and be seen back in the office by Nell Range. This your last structural heart visit.   Bacterial endocarditis prophylaxis  You will have to take antibiotics for the rest of your life before all dental procedures (even teeth cleanings) to protect your heart valve. Antibiotics are also required before some surgeries. Please check with your cardiologist before scheduling any surgeries. Also, please make sure to tell us if you have a penicillin allergy as you will require an alternative antibiotic.

## 2019-02-03 NOTE — TOC Initial Note (Signed)
Transition of Care [facility]) - Initial/Assessment Note    Patient Details  Name: Hannah [name] MRN: [id number] Date of Birth: [birth year]  [facility]) CM/SW Contact:    Zenon Mayo, RN Phone Number: 02/03/2019, 9:06 AM  Clinical Narrative:                 From home, her daughter lives with her, she has a PCP, she will have transport at dc, she has no issues with getting meds.   Expected Discharge Plan: Home/Self Care Barriers to Discharge: No Barriers Identified   Patient Goals and CMS Choice Patient states their goals for this hospitalization and ongoing recovery are:: walk more and loose weight   Choice offered to / list presented to : NA  Expected Discharge Plan and Services Expected Discharge Plan: Home/Self Care In-house Referral: NA Discharge Planning Services: CM Consult Post Acute Care Choice: NA Living arrangements for the past 2 months: Single Family Home                 DME Arranged: (NA)         HH Arranged: NA          Prior Living Arrangements/Services Living arrangements for the past 2 months: Single Family Home Lives with:: Adult Children Patient language and need for interpreter reviewed:: Yes Do you feel safe going back to the place where you live?: Yes      Need for Family Participation in Patient Care: Yes (Comment) Care giver support system in place?: Yes (comment)   Criminal Activity/Legal Involvement Pertinent to Current Situation/Hospitalization: No - Comment as needed  Activities of Daily Living Home Assistive Devices/Equipment: None ADL Screening (condition at time of admission) Patient's cognitive ability adequate to safely complete daily activities?: Yes Is the patient deaf or have difficulty hearing?: No Does the patient have difficulty seeing, even when wearing glasses/contacts?: Yes Does the patient have difficulty concentrating, remembering, or making decisions?: Yes(forgetful/dementia) Patient able to  express need for assistance with ADLs?: Yes Does the patient have difficulty dressing or bathing?: No Independently performs ADLs?: Yes (appropriate for developmental age) Does the patient have difficulty walking or climbing stairs?: No Weakness of Legs: None Weakness of Arms/Hands: None  Permission Sought/Granted                  Emotional Assessment Appearance:: Appears stated age Attitude/Demeanor/Rapport: Gracious Affect (typically observed): Appropriate Orientation: : Oriented to Self, Oriented to Place, Oriented to  Time, Oriented to Situation Alcohol / Substance Use: Not Applicable Psych Involvement: No (comment)  Admission diagnosis:  Nonrheumatic mitral valve regurgitation [I34.0] Patient Active Problem List   Diagnosis Date Noted  . Nonrheumatic mitral valve regurgitation 02/02/2019  . CKD (chronic kidney disease) stage 3, GFR 30-59 ml/min   . Anemia of chronic disease   . Mitral valve insufficiency   . Vitamin B12 deficiency 12/28/2018  . Acute on chronic diastolic heart failure ([place])   . Gastric bleed   . 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 Gout 09/11/2015  . OBESITY 03/26/2010  . Essential hypertension 08/09/2008  . GERD 08/09/2008   PCP:  [name], MD Pharmacy:   [id number] - [place], [place] [address] Phone: [phone] Fax: [phone]     Social Determinants of Health (SDOH) Interventions    Readmission Risk Interventions Readmission Risk Prevention Plan 02/03/2019  Transportation Screening Complete  Medication Review Press photographer) Complete  PCP or Specialist appointment within 3-5 days of discharge Complete  HRI or [place] Complete  SW Recovery Care/Counseling Consult Complete  [place] Not Applicable  Some recent data might be hidden

## 2019-02-06 ENCOUNTER — Telehealth: Payer: Self-pay | Admitting: Physician Assistant

## 2019-02-06 NOTE — Telephone Encounter (Signed)
  [facility]   MULTIDISCIPLINARY HEART VALVE TEAM  Attempted TOC call. Left VM.   Promiss Labarbera PA-C  [facility]     

## 2019-02-07 NOTE — Telephone Encounter (Signed)
  [place] VALVE TEAM   Patient contacted regarding discharge from [facility] on 02/03/19  Patient understands to follow up with provider Nell Range on 10/22 at [facility].  Patient understands discharge instructions? yes Patient understands medications and regimen? yes Patient understands to bring all medications to this visit? yes  Angelena Form PA-C  [facility]

## 2019-02-08 ENCOUNTER — Telehealth (HOSPITAL_COMMUNITY): Payer: Self-pay | Admitting: *Deleted

## 2019-02-08 ENCOUNTER — Telehealth (HOSPITAL_COMMUNITY): Payer: Self-pay

## 2019-02-08 NOTE — Telephone Encounter (Signed)
Pt returned call from message left earlier by support staff regarding Cardiac rehab referral.  Talked to Pt daughter, [name] who is her POA and DPR.  Reviewed with pt daughter pt insurance benefits and our referral process.  Pt has an upcoming appt on 10/22 in the mitral clip clinic.  Noted that pt has memory loss and dementia.  Pt daughter remarked that her dementia is "severe".  Explained our cardiac rehab process and expectations.  Visitors are not allowed to accompany patients, must wear a mask and be somewhat independent with activity since this is group setting and staff does not allow one on one.  Pt daughter concerned that pt may not be able to comply but felt physically this would be good for her mother.  She  would like to talk to Miguel Rota on tomorrow about cardiac rehab.  Cherre Huger, BSN Cardiac and Training and development officer

## 2019-02-08 NOTE — Progress Notes (Signed)
[facility] [place]                                       Cardiology Office Note    Date:  02/09/2019   ID:  Hannah [name] June 02, 1945, MRN [id number]  PCP:  Hannah Covert, MD  Cardiologist: Dr. Johnsie Cancel  CC: TOC s/p MitraClip  History of Present Illness:  Hannah [name] is a 73 y.o. female with a history of chronic diastolic CHF, HTN, dementia, GI bleeding,anxiety and severe mitral regurgitation s/p MitraClip (02/02/19) who presents to clinic for follow up.   Patient and her family deny any long history of cardiac disease or heart murmur. The patient has developed memory loss and anxiety which has slowly progressed over the last few years. She lives locally in [place] with 1 of her daughters and has remained functionally independent for the most part. However, she stopped driving her automobile more than 2 years ago. She has reportedly developed progressive exertional shortness of breath and anxiety. Since early July she has been seen in the emergency department 11 times and hospitalized on 5 different occasions with acute exacerbation of congestive heart failure and pulmonary edema. At the time of her initial presentation she was notably anemic with active GI bleeding for which she underwent mesenteric artery coil embolization. Echocardiogram performed at that time revealed normal left ventricular systolic function with moderate mitral regurgitation. Most recently she was hospitalized with another exacerbation of congestive heart failure and transesophageal echocardiogram revealed low normal left ventricular systolic function with severe mitral regurgitation. Diagnostic cardiac catheterization was notable for the absence of significant coronary artery disease and moderate pulmonary hypertension.   She was evaluated by the multidisciplinary valve team and underwent successful transcatheter edge-to-edge mitral valve repair  with a single MitraClip NTR placed A2/P2, reducing MR from severe at baseline to 1-2+, LA V wave reduced by 50% from baseline. Groin site is healing well. Post op echo showed EF 30-35%, with mild residual MR and moderate MS (transmitral gradient 7 mm HG). Given reduction in EF, her lopressor was changed to Toprol Xl 25mg  daily and Losartan 25mg  daily was added. She was discharged on aspirin and plavix.   Today she presents to clinic for follow up. No CP or SOB. No LE edema, orthopnea or PND. No dizziness or syncope. No blood in stool or urine. No palpitations. SHe is feeling fantastic with no issues. Daughter here for the visit. She can see a big difference in her mother's breathing and energy.    Past Medical History:  Diagnosis Date  . Anemia   . Anemia of chronic disease   . Anxiety   . Arthritis    "hands" (05/09/2015)  . Chronic diastolic heart failure ([place])   . CKD (chronic kidney disease) stage 3, GFR 30-59 ml/min   . Dementia ([place])   . Elevated TSH 02/03/2018  . GERD (gastroesophageal reflux disease)   . HLD (hyperlipidemia)   . Hypertension   . LBBB (left bundle branch block)   . Osteoarthritis   . 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 S/P mitral valve repair    s/p MitraClip on 02/03/19  . Vitamin B12 deficiency 12/28/2018    Past Surgical History:  Procedure Laterality Date  . BIOPSY  10/28/2018   Procedure: BIOPSY;  Surgeon: Juanita Craver, MD;  Location: [facility] ENDOSCOPY;  Service: Endoscopy;;  .  CARDIAC CATHETERIZATION  05/2004  . COLONOSCOPY  07/2002   Archie Endo 09/02/2010  . COLONOSCOPY WITH PROPOFOL N/A 10/28/2018   Procedure: COLONOSCOPY WITH PROPOFOL;  Surgeon: Juanita Craver, MD;  Location: [facility] ENDOSCOPY;  Service: Endoscopy;  Laterality: N/A;  . ESOPHAGOGASTRODUODENOSCOPY  07/2002   w/biopsies/notes 09/02/2010  . ESOPHAGOGASTRODUODENOSCOPY (EGD) WITH PROPOFOL N/A 10/28/2018   Procedure: ESOPHAGOGASTRODUODENOSCOPY (EGD) WITH PROPOFOL;  Surgeon: Juanita Craver, MD;  Location: [facility] ENDOSCOPY;   Service: Endoscopy;  Laterality: N/A;  . HOT HEMOSTASIS N/A 10/28/2018   Procedure: HOT HEMOSTASIS (ARGON PLASMA COAGULATION/BICAP);  Surgeon: Juanita Craver, MD;  Location: [facility] ENDOSCOPY;  Service: Endoscopy;  Laterality: N/A;  . IR ANGIOGRAM SELECTIVE EACH ADDITIONAL VESSEL  10/30/2018  . IR ANGIOGRAM SELECTIVE EACH ADDITIONAL VESSEL  10/30/2018  . IR ANGIOGRAM SELECTIVE EACH ADDITIONAL VESSEL  10/30/2018  . IR ANGIOGRAM SELECTIVE EACH ADDITIONAL VESSEL  10/30/2018  . IR ANGIOGRAM VISCERAL SELECTIVE  10/30/2018  . IR EMBO ART  VEN HEMORR LYMPH EXTRAV  INC GUIDE ROADMAPPING  10/30/2018  . IR US GUIDANCE  10/30/2018  . MITRAL VALVE REPAIR N/A 02/02/2019   Procedure: MITRAL VALVE REPAIR;  Surgeon: Sherren Mocha, MD;  Location: [place] CV LAB;  Service: Cardiovascular;  Laterality: N/A;  . POLYPECTOMY  10/28/2018   Procedure: POLYPECTOMY;  Surgeon: Juanita Craver, MD;  Location: [facility] ENDOSCOPY;  Service: Endoscopy;;  . RIGHT/LEFT HEART CATH AND CORONARY ANGIOGRAPHY N/A 12/29/2018   Procedure: RIGHT/LEFT HEART CATH AND CORONARY ANGIOGRAPHY;  Surgeon: Leonie Man, MD;  Location: [place] CV LAB;  Service: Cardiovascular;  Laterality: N/A;  . TEE WITHOUT CARDIOVERSION N/A 12/30/2018   Procedure: TRANSESOPHAGEAL ECHOCARDIOGRAM (TEE);  Surgeon: Lelon Perla, MD;  Location: [facility] ENDOSCOPY;  Service: Cardiovascular;  Laterality: N/A;    Current Medications: Outpatient Medications Prior to Visit  Medication Sig Dispense Refill  . acetaminophen (TYLENOL) 325 MG tablet Take 650 mg by mouth every 6 (six) hours as needed for mild pain or headache.    Marland Kitchen aspirin 81 MG chewable tablet Chew 1 tablet (81 mg total) by mouth daily.    Marland Kitchen atorvastatin (LIPITOR) 40 MG tablet Take 1 tablet (40 mg total) by mouth daily. 90 tablet 3  . clopidogrel (PLAVIX) 75 MG tablet Take 1 tablet (75 mg total) by mouth daily with breakfast. 90 tablet 1  . ferrous sulfate 324 MG TBEC Take 324 mg by mouth.    . furosemide (LASIX)  40 MG tablet Take 1.5 tablets (60 mg total) by mouth 2 (two) times daily. 60 tablet 0  . losartan (COZAAR) 25 MG tablet Take 1 tablet (25 mg total) by mouth daily. 30 tablet 11  . metoprolol succinate (TOPROL-XL) 25 MG 24 hr tablet Take 1 tablet (25 mg total) by mouth daily. Take with or immediately following a meal. 90 tablet 3  . Multiple Vitamins-Minerals (ADULT ONE DAILY GUMMIES) CHEW Chew 1 tablet by mouth daily.    . pantoprazole (PROTONIX) 40 MG tablet Take 1 tablet (40 mg total) by mouth daily. 90 tablet 1  . traZODone (DESYREL) 50 MG tablet Take 0.5 tablets (25 mg total) by mouth at bedtime as needed for sleep. 30 tablet 0  . vitamin B-12 1000 MCG tablet Take 1 tablet (1,000 mcg total) by mouth daily. 30 tablet 0   No facility-administered medications prior to visit.      Allergies:   Patient has no known allergies.   Social History   Socioeconomic History  . Marital status: Widowed    Spouse name: Chrissie Noa  .  Number of children: 4  . Years of education: 37  . Highest education level: High school graduate  Occupational History  . Occupation: Retired     Comment: Geographical information systems officer  . Financial resource strain: Not hard at all  . Food insecurity    Worry: Never true    Inability: Never true  . Transportation needs    Medical: No    Non-medical: No  Tobacco Use  . Smoking status: Former Smoker    Packs/day: 0.30    Years: 8.00    Pack years: 2.40    Types: Cigarettes    Quit date: 05/30/2004    Years since quitting: 14.7  . Smokeless tobacco: Never Used  Substance and Sexual Activity  . Alcohol use: No    Alcohol/week: 0.0 standard drinks  . Drug use: Never  . Sexual activity: Not Currently  Lifestyle  . Physical activity    Days per week: 0 days    Minutes per session: 0 min  . Stress: Not at all  Relationships  . Social connections    Talks on phone: More than three times a week    Gets together: More than three times a week    Attends religious service:  More than 4 times per year    Active member of club or organization: Yes    Attends meetings of clubs or organizations: Never    Relationship status: Widowed  Other Topics Concern  . Not on file  Social History Narrative   Emergency Contact: [name] [phone]   End of Life Plan: gave pt ad pamplet   Any pets: 1 Dog    Diet: pt has a varied diet, low consumption of carbs and fatty foods    Exercise: pt does not have a regular exercise routine    Seatbelts: pt wears seat regularly in car   Hobbies: spending time with grandchildren      *Updated 09/26/2018*   Patients daughter [name] lives with her and assists her in driving to doctors apts and errands. Patient does have a renewed drivers licenses, but feels more comfortable riding with her daughter. Patient enjoys going out to eat and spending time with her grandchildren who live in [place]. Patient stated they come during the summer to see her. Patient is excited her son, Vicente Males, is about to finish school to become a Theme park manager.    The patient has great family support system.      Family History:  The patient's family history includes Breast cancer in her sister; Cancer in her sister; Heart disease in her mother; Hypertension in her father and sister.     ROS:   Please see the history of present illness.    ROS All other systems reviewed and are negative.   PHYSICAL EXAM:   VS:  BP 122/60   Pulse (!) 108   Ht 5' 1.5" (1.562 m)   Wt 170 lb (77.1 kg)   SpO2 96%   BMI 31.60 kg/m    GEN: Well nourished, well developed, in no acute distress HEENT: normal Neck: no JVD or masses Cardiac: RRR; soft murmur.. No rubs, or gallops,no edema  Respiratory:  clear to auscultation bilaterally, normal work of breathing GI: soft, nontender, nondistended, + BS MS: no deformity or atrophy Skin: warm and dry, no rash Neuro:  Alert and Oriented x 3, Strength and sensation are intact Psych: euthymic mood, full affect   Wt Readings from Last 3  Encounters:  02/09/19 170 lb (77.1  kg)  02/02/19 167 lb 8 oz (76 kg)  01/30/19 167 lb 8 oz (76 kg)      Studies/Labs Reviewed:   EKG:  EKG is ordered today.  The ekg ordered today demonstrates sinus with LBBB and PVCs HR 97  Recent Labs: 12/09/2018: TSH 4.175 12/28/2018: Magnesium 1.7 01/30/2019: ALT 12; B Natriuretic Peptide 277.2 02/03/2019: BUN 25; Creatinine, Ser 1.43; Hemoglobin 8.6; Platelets 213; Potassium 3.2; Sodium 137   Lipid Panel    Component Value Date/Time   CHOL 127 12/09/2018 0716   CHOL 231 (H) 10/14/2016 0930   TRIG 44 12/09/2018 0716   HDL 55 12/09/2018 0716   HDL 52 10/14/2016 0930   CHOLHDL 2.3 12/09/2018 0716   VLDL 9 12/09/2018 0716   LDLCALC 63 12/09/2018 0716   LDLCALC 151 (H) 10/14/2016 0930    Additional studies/ records that were reviewed today include:  02/02/19 MITRAL VALVE REPAIR  Conclusion Successful transcatheter edge-to-edge mitral valve repair with a single MitraClip NTR placed A2/P2, reducing MR from severe at baseline to 1-2+, LA V wave reduced by 50% from baseline  Recommendations  Antiplatelet/Anticoag Recommend uninterrupted dual antiplatelet therapy with Aspirin 81mg  daily and Clopidogrel 75mg  daily for 6 months.    _____________    Echo 02/03/19:  IMPRESSIONS 1. Left ventricular ejection fraction, by visual estimation, is 30 to 35%. The left ventricle has moderately decreased function. Left ventricular septal wall thickness was normal. There is mildly increased left ventricular hypertrophy. 2. Left ventricular diastolic function could not be evaluated pattern of LV diastolic filling. 3. Global right ventricle has normal systolic function.The right ventricular size is normal. No increase in right ventricular wall thickness. 4. Left atrial size was severely dilated. 5. Right atrial size was mildly dilated. 6. Trivial pericardial effusion is present. 7. The mitral valve has been repaired/replaced. Mild mitral valve  regurgitation. Moderate mitral stenosis. 8. The tricuspid valve is normal in structure. Tricuspid valve regurgitation is mild. 9. The aortic valve is normal in structure. Aortic valve regurgitation was not visualized by color flow Doppler. 10. The pulmonic valve was not assessed. Pulmonic valve regurgitation was not assessed by color flow Doppler. 11. Aortic root could not be assessed. 12. Day 1 post MitraClip placement. Mitral regurgitation is mild. Transmitral gradients are elevated at 7 mmHg. LVEF decreased from prior the procedure from 50-55% to 30-35% with paradoxical septal motion. No pericardial effusion. 13. Iatrogenic left to right shunting.   ASSESSMENT & PLAN:   Severe MR s/p MitraClip: groin site healing well. Continue aspirin and plavix.SBE prophylaxis discussed; she deferred antibiotics being in called in right now. I will see her back in 1 month for echo and follow up.   Chronic combined S/D CHF: appears euvolemic. Continue lasix 60mg  daily.  NICM: continue Toprol XL 25mg  daily and Losartan 25mg  daily. Check BMET today given new ARB start  Hypokalemia: as above, check BMET today.   CKD stage III: creat has ranged 1-1.7. Creat 1.43 at discharge. Checking labs today  Chronic anemia with hx of GI bleed: no s/s bleeding. Will check a CBC given new DAPT  Medication Adjustments/Labs and Tests Ordered: Current medicines are reviewed at length with the patient today.  Concerns regarding medicines are outlined above.  Medication changes, Labs and Tests ordered today are listed in the Patient Instructions below. Patient Instructions  Medication Instructions:  1) Please let your dentist know that you have had a procedure done to your heart valve that requires you to take antibiotics prior.  Contact  our office when you need antibiotics.  *If you need a refill on your cardiac medications before your next appointment, please call your pharmacy*  Lab Work: BMET and CBC today   If you have labs (blood work) drawn today and your tests are completely normal, you will receive your results only by: Marland Kitchen MyChart Message (if you have MyChart) OR . A paper copy in the mail If you have any lab test that is abnormal or we need to change your treatment, we will call you to review the results.  Testing/Procedures: None  Follow-Up: At [facility], you and your health needs are our priority.  As part of our continuing mission to provide you with exceptional heart care, we have created designated Provider Care Teams.  These Care Teams include your primary Cardiologist (physician) and Advanced Practice Providers (APPs -  Physician Assistants and Nurse Practitioners) who all work together to provide you with the care you need, when you need it.  Your next appointment:   Keep your currently scheduled follow up appointments.   Other Instructions      Signed, Angelena Form, PA-C  02/09/2019 4:30 PM    [facility] [address], [place], [place]  [address] Phone: [phone]; Fax: [phone]

## 2019-02-08 NOTE — Telephone Encounter (Signed)
Pt insurance is active and benefits verified through [facility] [employer]. Co-pay $20.00, DED $0.00/$0.00 met, out of pocket $4,500.00/$4,500.00 met, co-insurance 0%. No pre-authorization required. Passport, 02/08/2019 @ 9:11AM, [id number]  Will contact patient to see if she is interested in the Cardiac Rehab Program. If interested, patient will need to complete follow up appt. Once completed, patient will be contacted for scheduling upon review by the RN Navigator.

## 2019-02-08 NOTE — Telephone Encounter (Signed)
Attempted to call patient in regards to Cardiac Rehab - LM on VM 

## 2019-02-09 ENCOUNTER — Other Ambulatory Visit: Payer: Self-pay

## 2019-02-09 ENCOUNTER — Encounter: Payer: Self-pay | Admitting: Physician Assistant

## 2019-02-09 ENCOUNTER — Ambulatory Visit (INDEPENDENT_AMBULATORY_CARE_PROVIDER_SITE_OTHER): Payer: Medicare Other | Admitting: Physician Assistant

## 2019-02-09 VITALS — BP 122/60 | HR 108 | Ht 61.5 in | Wt 170.0 lb

## 2019-02-09 DIAGNOSIS — E876 Hypokalemia: Secondary | ICD-10-CM

## 2019-02-09 DIAGNOSIS — D649 Anemia, unspecified: Secondary | ICD-10-CM

## 2019-02-09 DIAGNOSIS — I5043 Acute on chronic combined systolic (congestive) and diastolic (congestive) heart failure: Secondary | ICD-10-CM | POA: Diagnosis not present

## 2019-02-09 DIAGNOSIS — I428 Other cardiomyopathies: Secondary | ICD-10-CM | POA: Diagnosis not present

## 2019-02-09 DIAGNOSIS — Z9889 Other specified postprocedural states: Secondary | ICD-10-CM

## 2019-02-09 DIAGNOSIS — N183 Chronic kidney disease, stage 3 unspecified: Secondary | ICD-10-CM

## 2019-02-09 NOTE — Patient Instructions (Addendum)
Medication Instructions:  1) Please let your dentist know that you have had a procedure done to your heart valve that requires you to take antibiotics prior.  Contact our office when you need antibiotics.  *If you need a refill on your cardiac medications before your next appointment, please call your pharmacy*  Lab Work: BMET and CBC today  If you have labs (blood work) drawn today and your tests are completely normal, you will receive your results only by: Marland Kitchen MyChart Message (if you have MyChart) OR . A paper copy in the mail If you have any lab test that is abnormal or we need to change your treatment, we will call you to review the results.  Testing/Procedures: None  Follow-Up: At [facility], you and your health needs are our priority.  As part of our continuing mission to provide you with exceptional heart care, we have created designated Provider Care Teams.  These Care Teams include your primary Cardiologist (physician) and Advanced Practice Providers (APPs -  Physician Assistants and Nurse Practitioners) who all work together to provide you with the care you need, when you need it.  Your next appointment:   Keep your currently scheduled follow up appointments.   Other Instructions

## 2019-02-10 ENCOUNTER — Telehealth: Payer: Self-pay | Admitting: Family Medicine

## 2019-02-10 ENCOUNTER — Other Ambulatory Visit: Payer: Self-pay | Admitting: Family Medicine

## 2019-02-10 LAB — CBC
Hematocrit: 26.5 % — ABNORMAL LOW (ref 34.0–46.6)
Hemoglobin: 8.5 g/dL — ABNORMAL LOW (ref 11.1–15.9)
MCH: 30.8 pg (ref 26.6–33.0)
MCHC: 32.1 g/dL (ref 31.5–35.7)
MCV: 96 fL (ref 79–97)
Platelets: 314 10*3/uL (ref 150–450)
RBC: 2.76 x10E6/uL — ABNORMAL LOW (ref 3.77–5.28)
RDW: 15.1 % (ref 11.7–15.4)
WBC: 7.7 10*3/uL (ref 3.4–10.8)

## 2019-02-10 LAB — BASIC METABOLIC PANEL
BUN/Creatinine Ratio: 29 — ABNORMAL HIGH (ref 12–28)
BUN: 41 mg/dL — ABNORMAL HIGH (ref 8–27)
CO2: 27 mmol/L (ref 20–29)
Calcium: 9.1 mg/dL (ref 8.7–10.3)
Chloride: 97 mmol/L (ref 96–106)
Creatinine, Ser: 1.41 mg/dL — ABNORMAL HIGH (ref 0.57–1.00)
GFR calc Af Amer: 42 mL/min/{1.73_m2} — ABNORMAL LOW (ref >59)
GFR calc non Af Amer: 36 mL/min/{1.73_m2} — ABNORMAL LOW (ref >59)
Glucose: 105 mg/dL — ABNORMAL HIGH (ref 65–99)
Potassium: 3.7 mmol/L (ref 3.5–5.2)
Sodium: 140 mmol/L (ref 134–144)

## 2019-02-10 MED ORDER — CYANOCOBALAMIN 1000 MCG PO TABS: 1000.0000 ug | ORAL_TABLET | Freq: Every day | ORAL | 0 refills | Status: DC

## 2019-02-10 NOTE — Telephone Encounter (Signed)
Left generic voicemail to call back.  [name], MD  Family Medicine Teaching Service

## 2019-02-13 ENCOUNTER — Telehealth (HOSPITAL_COMMUNITY): Payer: Self-pay

## 2019-02-13 ENCOUNTER — Encounter (HOSPITAL_COMMUNITY): Payer: Self-pay

## 2019-02-13 NOTE — Telephone Encounter (Signed)
Attempted to call patient in regards to Cardiac Rehab - LM on VM Mailed letter 

## 2019-02-14 ENCOUNTER — Ambulatory Visit: Payer: Medicare Other | Admitting: Family Medicine

## 2019-02-16 ENCOUNTER — Encounter: Payer: Self-pay | Admitting: Family Medicine

## 2019-02-16 ENCOUNTER — Telehealth: Payer: Self-pay | Admitting: Family Medicine

## 2019-02-16 NOTE — Telephone Encounter (Signed)
  More confused - been 2 weeks Feels very weak.  Labored breathing  No edema Taking lasix regularly  Pulse Ox 92-4%  Recommend She needs to be seen for exam and to check hgb.  Concerned being on plavix and asa that her anemia is worsening.  Was stable but low a week ago  Daughter does not want to discuss placement until this is improved.  She will reach out via MyChart when she would like to discus

## 2019-02-17 ENCOUNTER — Other Ambulatory Visit: Payer: Self-pay | Admitting: Family Medicine

## 2019-02-21 ENCOUNTER — Emergency Department (HOSPITAL_COMMUNITY): Payer: Medicare Other

## 2019-02-21 ENCOUNTER — Inpatient Hospital Stay (HOSPITAL_COMMUNITY)
Admission: EM | Admit: 2019-02-21 | Discharge: 2019-02-28 | DRG: 378 | Disposition: A | Discharge status: Skilled Nursing Facility | Payer: Medicare Other | Attending: Family Medicine | Admitting: Family Medicine

## 2019-02-21 ENCOUNTER — Other Ambulatory Visit: Payer: Self-pay

## 2019-02-21 DIAGNOSIS — M109 Gout, unspecified: Secondary | ICD-10-CM | POA: Diagnosis present

## 2019-02-21 DIAGNOSIS — E861 Hypovolemia: Secondary | ICD-10-CM | POA: Diagnosis not present

## 2019-02-21 DIAGNOSIS — Z803 Family history of malignant neoplasm of breast: Secondary | ICD-10-CM

## 2019-02-21 DIAGNOSIS — D631 Anemia in chronic kidney disease: Secondary | ICD-10-CM | POA: Diagnosis present

## 2019-02-21 DIAGNOSIS — Z743 Need for continuous supervision: Secondary | ICD-10-CM | POA: Diagnosis not present

## 2019-02-21 DIAGNOSIS — I5043 Acute on chronic combined systolic (congestive) and diastolic (congestive) heart failure: Secondary | ICD-10-CM | POA: Diagnosis not present

## 2019-02-21 DIAGNOSIS — Z7982 Long term (current) use of aspirin: Secondary | ICD-10-CM | POA: Diagnosis not present

## 2019-02-21 DIAGNOSIS — E538 Deficiency of other specified B group vitamins: Secondary | ICD-10-CM | POA: Diagnosis present

## 2019-02-21 DIAGNOSIS — T457X5A Adverse effect of anticoagulant antagonists, vitamin K and other coagulants, initial encounter: Secondary | ICD-10-CM | POA: Diagnosis present

## 2019-02-21 DIAGNOSIS — I5042 Chronic combined systolic (congestive) and diastolic (congestive) heart failure: Secondary | ICD-10-CM | POA: Diagnosis not present

## 2019-02-21 DIAGNOSIS — K922 Gastrointestinal hemorrhage, unspecified: Secondary | ICD-10-CM | POA: Diagnosis present

## 2019-02-21 DIAGNOSIS — I5020 Unspecified systolic (congestive) heart failure: Secondary | ICD-10-CM | POA: Diagnosis not present

## 2019-02-21 DIAGNOSIS — D638 Anemia in other chronic diseases classified elsewhere: Secondary | ICD-10-CM | POA: Diagnosis not present

## 2019-02-21 DIAGNOSIS — I447 Left bundle-branch block, unspecified: Secondary | ICD-10-CM | POA: Diagnosis present

## 2019-02-21 DIAGNOSIS — N179 Acute kidney failure, unspecified: Secondary | ICD-10-CM

## 2019-02-21 DIAGNOSIS — G309 Alzheimer's disease, unspecified: Secondary | ICD-10-CM | POA: Diagnosis not present

## 2019-02-21 DIAGNOSIS — Z20828 Contact with and (suspected) exposure to other viral communicable diseases: Secondary | ICD-10-CM | POA: Diagnosis present

## 2019-02-21 DIAGNOSIS — I959 Hypotension, unspecified: Secondary | ICD-10-CM | POA: Diagnosis present

## 2019-02-21 DIAGNOSIS — R58 Hemorrhage, not elsewhere classified: Secondary | ICD-10-CM | POA: Diagnosis not present

## 2019-02-21 DIAGNOSIS — E785 Hyperlipidemia, unspecified: Secondary | ICD-10-CM | POA: Diagnosis present

## 2019-02-21 DIAGNOSIS — T457X5D Adverse effect of anticoagulant antagonists, vitamin K and other coagulants, subsequent encounter: Secondary | ICD-10-CM | POA: Diagnosis not present

## 2019-02-21 DIAGNOSIS — N184 Chronic kidney disease, stage 4 (severe): Secondary | ICD-10-CM | POA: Diagnosis present

## 2019-02-21 DIAGNOSIS — K31811 Angiodysplasia of stomach and duodenum with bleeding: Secondary | ICD-10-CM | POA: Diagnosis not present

## 2019-02-21 DIAGNOSIS — M1990 Unspecified osteoarthritis, unspecified site: Secondary | ICD-10-CM | POA: Diagnosis not present

## 2019-02-21 DIAGNOSIS — D500 Iron deficiency anemia secondary to blood loss (chronic): Secondary | ICD-10-CM | POA: Diagnosis present

## 2019-02-21 DIAGNOSIS — R0789 Other chest pain: Secondary | ICD-10-CM | POA: Diagnosis not present

## 2019-02-21 DIAGNOSIS — Z7902 Long term (current) use of antithrombotics/antiplatelets: Secondary | ICD-10-CM

## 2019-02-21 DIAGNOSIS — K219 Gastro-esophageal reflux disease without esophagitis: Secondary | ICD-10-CM | POA: Diagnosis not present

## 2019-02-21 DIAGNOSIS — F0280 Dementia in other diseases classified elsewhere without behavioral disturbance: Secondary | ICD-10-CM | POA: Diagnosis present

## 2019-02-21 DIAGNOSIS — G4700 Insomnia, unspecified: Secondary | ICD-10-CM | POA: Diagnosis not present

## 2019-02-21 DIAGNOSIS — Z9889 Other specified postprocedural states: Secondary | ICD-10-CM | POA: Diagnosis not present

## 2019-02-21 DIAGNOSIS — N183 Chronic kidney disease, stage 3 unspecified: Secondary | ICD-10-CM | POA: Diagnosis not present

## 2019-02-21 DIAGNOSIS — Z7401 Bed confinement status: Secondary | ICD-10-CM | POA: Diagnosis not present

## 2019-02-21 DIAGNOSIS — I129 Hypertensive chronic kidney disease with stage 1 through stage 4 chronic kidney disease, or unspecified chronic kidney disease: Secondary | ICD-10-CM | POA: Diagnosis not present

## 2019-02-21 DIAGNOSIS — R5383 Other fatigue: Secondary | ICD-10-CM | POA: Diagnosis not present

## 2019-02-21 DIAGNOSIS — D538 Other specified nutritional anemias: Secondary | ICD-10-CM | POA: Diagnosis not present

## 2019-02-21 DIAGNOSIS — E876 Hypokalemia: Secondary | ICD-10-CM | POA: Diagnosis present

## 2019-02-21 DIAGNOSIS — R195 Other fecal abnormalities: Secondary | ICD-10-CM | POA: Diagnosis not present

## 2019-02-21 DIAGNOSIS — R079 Chest pain, unspecified: Secondary | ICD-10-CM | POA: Diagnosis not present

## 2019-02-21 DIAGNOSIS — D132 Benign neoplasm of duodenum: Secondary | ICD-10-CM | POA: Diagnosis not present

## 2019-02-21 DIAGNOSIS — Z954 Presence of other heart-valve replacement: Secondary | ICD-10-CM | POA: Diagnosis not present

## 2019-02-21 DIAGNOSIS — D509 Iron deficiency anemia, unspecified: Secondary | ICD-10-CM | POA: Diagnosis not present

## 2019-02-21 DIAGNOSIS — R0602 Shortness of breath: Secondary | ICD-10-CM | POA: Diagnosis not present

## 2019-02-21 DIAGNOSIS — Z952 Presence of prosthetic heart valve: Secondary | ICD-10-CM | POA: Diagnosis not present

## 2019-02-21 DIAGNOSIS — I491 Atrial premature depolarization: Secondary | ICD-10-CM | POA: Diagnosis not present

## 2019-02-21 DIAGNOSIS — M2550 Pain in unspecified joint: Secondary | ICD-10-CM | POA: Diagnosis not present

## 2019-02-21 DIAGNOSIS — M6281 Muscle weakness (generalized): Secondary | ICD-10-CM | POA: Diagnosis not present

## 2019-02-21 DIAGNOSIS — F0391 Unspecified dementia with behavioral disturbance: Secondary | ICD-10-CM | POA: Diagnosis not present

## 2019-02-21 DIAGNOSIS — I130 Hypertensive heart and chronic kidney disease with heart failure and stage 1 through stage 4 chronic kidney disease, or unspecified chronic kidney disease: Secondary | ICD-10-CM | POA: Diagnosis present

## 2019-02-21 DIAGNOSIS — Z8249 Family history of ischemic heart disease and other diseases of the circulatory system: Secondary | ICD-10-CM | POA: Diagnosis not present

## 2019-02-21 DIAGNOSIS — D649 Anemia, unspecified: Secondary | ICD-10-CM | POA: Diagnosis not present

## 2019-02-21 DIAGNOSIS — K5521 Angiodysplasia of colon with hemorrhage: Secondary | ICD-10-CM | POA: Diagnosis not present

## 2019-02-21 DIAGNOSIS — F0281 Dementia in other diseases classified elsewhere with behavioral disturbance: Secondary | ICD-10-CM | POA: Diagnosis not present

## 2019-02-21 DIAGNOSIS — Z87891 Personal history of nicotine dependence: Secondary | ICD-10-CM

## 2019-02-21 DIAGNOSIS — G4489 Other headache syndrome: Secondary | ICD-10-CM | POA: Diagnosis not present

## 2019-02-21 DIAGNOSIS — R05 Cough: Secondary | ICD-10-CM | POA: Diagnosis not present

## 2019-02-21 DIAGNOSIS — T457X5S Adverse effect of anticoagulant antagonists, vitamin K and other coagulants, sequela: Secondary | ICD-10-CM | POA: Diagnosis not present

## 2019-02-21 HISTORY — DX: Nonrheumatic mitral (valve) insufficiency: I34.0

## 2019-02-21 LAB — BASIC METABOLIC PANEL
Anion gap: 18 — ABNORMAL HIGH (ref 5–15)
BUN: 46 mg/dL — ABNORMAL HIGH (ref 8–23)
CO2: 22 mmol/L (ref 22–32)
Calcium: 8.2 mg/dL — ABNORMAL LOW (ref 8.9–10.3)
Chloride: 99 mmol/L (ref 98–111)
Creatinine, Ser: 2.08 mg/dL — ABNORMAL HIGH (ref 0.44–1.00)
GFR calc Af Amer: 26 mL/min — ABNORMAL LOW (ref >60)
GFR calc non Af Amer: 23 mL/min — ABNORMAL LOW (ref >60)
Glucose, Bld: 119 mg/dL — ABNORMAL HIGH (ref 70–99)
Potassium: 3.5 mmol/L (ref 3.5–5.1)
Sodium: 139 mmol/L (ref 135–145)

## 2019-02-21 LAB — CBC WITH DIFFERENTIAL/PLATELET
Abs Immature Granulocytes: 0.02 10*3/uL (ref 0.00–0.07)
Basophils Absolute: 0 10*3/uL (ref 0.0–0.1)
Basophils Relative: 0 %
Eosinophils Absolute: 0.1 10*3/uL (ref 0.0–0.5)
Eosinophils Relative: 2 %
HCT: 18.5 % — ABNORMAL LOW (ref 36.0–46.0)
Hemoglobin: 5.7 g/dL — CL (ref 12.0–15.0)
Immature Granulocytes: 0 %
Lymphocytes Relative: 26 %
Lymphs Abs: 1.4 10*3/uL (ref 0.7–4.0)
MCH: 31.1 pg (ref 26.0–34.0)
MCHC: 30.8 g/dL (ref 30.0–36.0)
MCV: 101.1 fL — ABNORMAL HIGH (ref 80.0–100.0)
Monocytes Absolute: 0.5 10*3/uL (ref 0.1–1.0)
Monocytes Relative: 8 %
Neutro Abs: 3.4 10*3/uL (ref 1.7–7.7)
Neutrophils Relative %: 64 %
Platelets: 365 10*3/uL (ref 150–400)
RBC: 1.83 MIL/uL — ABNORMAL LOW (ref 3.87–5.11)
RDW: 15.9 % — ABNORMAL HIGH (ref 11.5–15.5)
WBC: 5.5 10*3/uL (ref 4.0–10.5)
nRBC: 0 % (ref 0.0–0.2)

## 2019-02-21 LAB — FERRITIN: Ferritin: 20 ng/mL (ref 11–307)

## 2019-02-21 LAB — SARS CORONAVIRUS 2 (TAT 6-24 HRS): SARS Coronavirus 2: NEGATIVE

## 2019-02-21 LAB — IRON AND TIBC
Iron: 21 ug/dL — ABNORMAL LOW (ref 28–170)
Saturation Ratios: 6 % — ABNORMAL LOW (ref 10.4–31.8)
TIBC: 347 ug/dL (ref 250–450)
UIBC: 326 ug/dL

## 2019-02-21 LAB — TROPONIN I (HIGH SENSITIVITY)
Troponin I (High Sensitivity): 14 ng/L (ref <18)
Troponin I (High Sensitivity): 16 ng/L (ref <18)

## 2019-02-21 LAB — PREPARE RBC (CROSSMATCH)

## 2019-02-21 LAB — POC OCCULT BLOOD, ED: Fecal Occult Bld: POSITIVE — AB

## 2019-02-21 LAB — BRAIN NATRIURETIC PEPTIDE: B Natriuretic Peptide: 258 pg/mL — ABNORMAL HIGH (ref 0.0–100.0)

## 2019-02-21 MED ORDER — SODIUM CHLORIDE 0.9 % IV SOLN
10.0000 mL/h | Freq: Once | INTRAVENOUS | Status: AC
  Administered 2019-02-21: 10 mL/h via INTRAVENOUS

## 2019-02-21 MED ORDER — VITAMIN B-12 1000 MCG PO TABS
1000.0000 ug | ORAL_TABLET | Freq: Every day | ORAL | Status: DC
  Administered 2019-02-22 – 2019-02-28 (×7): 1000 ug via ORAL
  Filled 2019-02-21 (×7): qty 1

## 2019-02-21 MED ORDER — ADULT MULTIVITAMIN W/MINERALS CH
1.0000 | ORAL_TABLET | Freq: Every day | ORAL | Status: DC
  Administered 2019-02-21 – 2019-02-28 (×8): 1 via ORAL
  Filled 2019-02-21 (×8): qty 1

## 2019-02-21 MED ORDER — ATORVASTATIN CALCIUM 40 MG PO TABS
40.0000 mg | ORAL_TABLET | Freq: Every day | ORAL | Status: DC
  Administered 2019-02-22 – 2019-02-28 (×7): 40 mg via ORAL
  Filled 2019-02-21 (×7): qty 1

## 2019-02-21 MED ORDER — METOPROLOL SUCCINATE ER 25 MG PO TB24: 25.0000 mg | ORAL_TABLET | Freq: Every day | ORAL | Status: DC

## 2019-02-21 MED ORDER — FUROSEMIDE 20 MG PO TABS: 60.0000 mg | ORAL_TABLET | Freq: Two times a day (BID) | ORAL | Status: DC

## 2019-02-21 MED ORDER — PANTOPRAZOLE SODIUM 40 MG IV SOLR
40.0000 mg | Freq: Once | INTRAVENOUS | Status: AC
  Administered 2019-02-21: 40 mg via INTRAVENOUS
  Filled 2019-02-21: qty 40

## 2019-02-21 MED ORDER — ADULT ONE DAILY GUMMIES PO CHEW: 1.0000 | CHEWABLE_TABLET | Freq: Every day | ORAL | Status: DC

## 2019-02-21 MED ORDER — TRAZODONE HCL 50 MG PO TABS
25.0000 mg | ORAL_TABLET | Freq: Every evening | ORAL | Status: DC | PRN
  Administered 2019-02-22: 25 mg via ORAL
  Filled 2019-02-21 (×3): qty 1

## 2019-02-21 NOTE — ED Triage Notes (Signed)
Pt endorses weakness X2 days. Pt with sudden onset of sob and chest tightness approx 45 mins pta. Pt lungs clear bilaterally with oxygen saturation 99% RA. BP 105/41. Pt c/o dizziness on arrival. 12 lead shows LBBB. Denies NVD. Pt with no chest pain on arrival.

## 2019-02-21 NOTE — ED Provider Notes (Signed)
[place] EMERGENCY DEPARTMENT Provider Note   CSN: [id number] Arrival date & time: 02/21/19  1420     History   Chief Complaint Chief Complaint  Patient presents with  . Shortness of Breath    HPI Hannah [name] is a 73 y.o. female past medical history significant for anemia of chronic disease, chronic diastolic heart failure, CKD, dementia, hypertension, mitral valve repair on plavix presents to emergency room today with chief complaint of weakness and shortness of breath x2 days.  She states dyspnea is worse with exertion.  She feels as if she has to sit down to catch her breath and this is unusual for her.  She does not wear oxygen at home.  Her daughter checked her blood pressure yesterday and reports it was low, she does not member exactly what the numbers were.  She is currently on Lasix 40 mg twice daily which she reports compliance with.  She does not think her legs appear more swollen than usual.  She denies any fever, chills, diaphoresis, congestion, abdominal pain, nausea, vomiting, wheezing, blood in stool, melena, hemoptysis, diarrhea, gross hematuria.  Chart review shows echo in 02/03/2019 with LVEF of 30 to 35%. Cardiologist is Dr. Johnsie Cancel. Patient had admission for symptomatic anemia in 10/2018.  During admission she underwent EGD and colonoscopy.  She was found to have single 7 mm sessile polypoid lesion with bleeding was found in the duodenal bulb with fresh blood oozing from the center.  Past Medical History:  Diagnosis Date  . Anemia   . Anemia of chronic disease   . Anxiety   . Arthritis    "hands" (05/09/2015)  . Chronic diastolic heart failure ([place])   . CKD (chronic kidney disease) stage 3, GFR 30-59 ml/min   . Dementia ([place])   . Elevated TSH 02/03/2018  . GERD (gastroesophageal reflux disease)   . HLD (hyperlipidemia)   . Hypertension   . LBBB (left bundle branch block)   . Osteoarthritis   . 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 S/P mitral  valve repair    s/p MitraClip on 02/03/19  . Vitamin B12 deficiency 12/28/2018    Patient Active Problem List   Diagnosis Date Noted  . Hypokalemia 02/03/2019  . S/P mitral valve repair   . Nonrheumatic mitral valve regurgitation 02/02/2019  . CKD (chronic kidney disease) stage 3, GFR 30-59 ml/min   . Anemia of chronic disease   . Mitral valve insufficiency   . Vitamin B12 deficiency 12/28/2018  . Acute on chronic combined systolic and diastolic CHF (congestive heart failure) ([place])   . Gastric bleed   . Dementia due to atherosclerosis with behavioral disturbance ([place]) 02/02/2018  . Gout 09/11/2015  . OBESITY 03/26/2010  . Essential hypertension 08/09/2008  . GERD 08/09/2008    Past Surgical History:  Procedure Laterality Date  . BIOPSY  10/28/2018   Procedure: BIOPSY;  Surgeon: Juanita Craver, MD;  Location: [facility] ENDOSCOPY;  Service: Endoscopy;;  . CARDIAC CATHETERIZATION  05/2004  . COLONOSCOPY  07/2002   Archie Endo 09/02/2010  . COLONOSCOPY WITH PROPOFOL N/A 10/28/2018   Procedure: COLONOSCOPY WITH PROPOFOL;  Surgeon: Juanita Craver, MD;  Location: [facility] ENDOSCOPY;  Service: Endoscopy;  Laterality: N/A;  . ESOPHAGOGASTRODUODENOSCOPY  07/2002   w/biopsies/notes 09/02/2010  . ESOPHAGOGASTRODUODENOSCOPY (EGD) WITH PROPOFOL N/A 10/28/2018   Procedure: ESOPHAGOGASTRODUODENOSCOPY (EGD) WITH PROPOFOL;  Surgeon: Juanita Craver, MD;  Location: [facility] ENDOSCOPY;  Service: Endoscopy;  Laterality: N/A;  . HOT HEMOSTASIS N/A 10/28/2018   Procedure: HOT HEMOSTASIS (ARGON  PLASMA COAGULATION/BICAP);  Surgeon: Juanita Craver, MD;  Location: [facility] ENDOSCOPY;  Service: Endoscopy;  Laterality: N/A;  . IR ANGIOGRAM SELECTIVE EACH ADDITIONAL VESSEL  10/30/2018  . IR ANGIOGRAM SELECTIVE EACH ADDITIONAL VESSEL  10/30/2018  . IR ANGIOGRAM SELECTIVE EACH ADDITIONAL VESSEL  10/30/2018  . IR ANGIOGRAM SELECTIVE EACH ADDITIONAL VESSEL  10/30/2018  . IR ANGIOGRAM VISCERAL SELECTIVE  10/30/2018  . IR EMBO ART  VEN HEMORR LYMPH EXTRAV  INC  GUIDE ROADMAPPING  10/30/2018  . IR US GUIDANCE  10/30/2018  . MITRAL VALVE REPAIR N/A 02/02/2019   Procedure: MITRAL VALVE REPAIR;  Surgeon: Sherren Mocha, MD;  Location: [facility] CV LAB;  Service: Cardiovascular;  Laterality: N/A;  . POLYPECTOMY  10/28/2018   Procedure: POLYPECTOMY;  Surgeon: Juanita Craver, MD;  Location: [facility] ENDOSCOPY;  Service: Endoscopy;;  . RIGHT/LEFT HEART CATH AND CORONARY ANGIOGRAPHY N/A 12/29/2018   Procedure: RIGHT/LEFT HEART CATH AND CORONARY ANGIOGRAPHY;  Surgeon: Leonie Man, MD;  Location: [place] CV LAB;  Service: Cardiovascular;  Laterality: N/A;  . TEE WITHOUT CARDIOVERSION N/A 12/30/2018   Procedure: TRANSESOPHAGEAL ECHOCARDIOGRAM (TEE);  Surgeon: Lelon Perla, MD;  Location: [facility] ENDOSCOPY;  Service: Cardiovascular;  Laterality: N/A;     OB History   No obstetric history on file.      Home Medications    Prior to Admission medications   Medication Sig Start Date End Date Taking? Authorizing Provider  acetaminophen (TYLENOL) 325 MG tablet Take 650 mg by mouth every 6 (six) hours as needed for mild pain or headache.    [provider]  aspirin 81 MG chewable tablet Chew 1 tablet (81 mg total) by mouth daily. 02/04/19   Eileen Stanford, PA-C  atorvastatin (LIPITOR) 40 MG tablet Take 1 tablet (40 mg total) by mouth daily. 05/31/18   [name], MD  clopidogrel (PLAVIX) 75 MG tablet Take 1 tablet (75 mg total) by mouth daily with breakfast. 02/04/19   Eileen Stanford, PA-C  cyanocobalamin 1000 MCG tablet Take 1 tablet (1,000 mcg total) by mouth daily. 02/10/19   Martyn Malay, MD  ferrous sulfate 324 MG TBEC Take 324 mg by mouth.    [provider]  furosemide (LASIX) 40 MG tablet Take 1.5 tablets (60 mg total) by mouth 2 (two) times daily. 01/18/19   Shirley, [place], DO  losartan (COZAAR) 25 MG tablet Take 1 tablet (25 mg total) by mouth daily. 02/03/19 02/03/20  Eileen Stanford, PA-C  metoprolol  succinate (TOPROL-XL) 25 MG 24 hr tablet Take 1 tablet (25 mg total) by mouth daily. Take with or immediately following a meal. 02/03/19 02/03/20  Eileen Stanford, PA-C  Multiple Vitamins-Minerals (ADULT ONE DAILY GUMMIES) CHEW Chew 1 tablet by mouth daily.    [provider]  pantoprazole (PROTONIX) 40 MG tablet Take 1 tablet (40 mg total) by mouth daily. 02/04/19   Eileen Stanford, PA-C  traZODone (DESYREL) 50 MG tablet Take 0.5 tablets (25 mg total) by mouth at bedtime as needed for sleep. 01/01/19   Mullis, Archie Endo, DO    Family History Family History  Problem Relation Age of Onset  . Heart disease Mother        died at age 38  . Breast cancer Sister   . Cancer Sister        Breast  . Hypertension Father   . Hypertension Sister     Social History Social History   Tobacco Use  . Smoking status: Former Smoker  Packs/day: 0.30    Years: 8.00    Pack years: 2.40    Types: Cigarettes    Quit date: 05/30/2004    Years since quitting: 14.7  . Smokeless tobacco: Never Used  Substance Use Topics  . Alcohol use: No    Alcohol/week: 0.0 standard drinks  . Drug use: Never     Allergies   Patient has no known allergies.   Review of Systems Review of Systems  Constitutional: Negative for chills and fever.  HENT: Negative for congestion, ear discharge, ear pain, sinus pressure, sinus pain and sore throat.   Eyes: Negative for pain and redness.  Respiratory: Positive for shortness of breath. Negative for cough and wheezing.   Cardiovascular: Positive for chest pain and leg swelling (Unchanged today). Negative for palpitations.  Gastrointestinal: Negative for abdominal pain, constipation, diarrhea, nausea and vomiting.  Genitourinary: Negative for dysuria and hematuria.  Musculoskeletal: Negative for back pain and neck pain.  Skin: Negative for wound.  Neurological: Negative for weakness, numbness and headaches.     Physical Exam Updated Vital Signs BP  (!) 107/54   Pulse 90   Temp 97.8 F (36.6 C) (Oral)   Resp (!) 28   SpO2 100%   Physical Exam Vitals signs and nursing note reviewed.  Constitutional:      General: She is not in acute distress.    Appearance: She is not ill-appearing.  HENT:     Head: Normocephalic and atraumatic.     Right Ear: Tympanic membrane and external ear normal.     Left Ear: Tympanic membrane and external ear normal.     Nose: Nose normal.     Mouth/Throat:     Mouth: Mucous membranes are pale and dry.     Pharynx: Oropharynx is clear.  Eyes:     General: No scleral icterus.       Right eye: No discharge.        Left eye: No discharge.     Extraocular Movements: Extraocular movements intact.     Conjunctiva/sclera: Conjunctivae normal.     Pupils: Pupils are equal, round, and reactive to light.  Neck:     Musculoskeletal: Normal range of motion.     Vascular: No JVD.  Cardiovascular:     Rate and Rhythm: Normal rate and regular rhythm.     Pulses: Normal pulses.          Radial pulses are 2+ on the right side and 2+ on the left side.     Heart sounds: Normal heart sounds.  Pulmonary:     Comments: Lungs clear to auscultation in all fields. Symmetric chest rise. No wheezing, rales, or rhonchi. Abdominal:     Comments: Abdomen is soft, non-distended, and non-tender in all quadrants. No rigidity, no guarding. No peritoneal signs.  Genitourinary:    Rectum: Guaiac result positive.     Comments: Chaperone Abby NT present for exam. Digital Rectal Exam reveals sphincter with good tone. No external hemorrhoids. No masses or fissures. Stool color is brown with no overt blood. No gross melena.  Musculoskeletal: Normal range of motion.  Skin:    General: Skin is warm and dry.     Capillary Refill: Capillary refill takes less than 2 seconds.     Coloration: Skin is pale.  Neurological:     Mental Status: She is oriented to person, place, and time.     GCS: GCS eye subscore is 4. GCS verbal subscore  is 5. GCS motor  subscore is 6.     Comments: Fluent speech, no facial droop.  Patient is pleasantly demented.  She is alert to person and location.  Psychiatric:        Behavior: Behavior normal.      ED Treatments / Results  Labs (all labs ordered are listed, but only abnormal results are displayed) Labs Reviewed  BASIC METABOLIC PANEL - Abnormal; Notable for the following components:      Result Value   Glucose, Bld 119 (*)    BUN 46 (*)    Creatinine, Ser 2.08 (*)    Calcium 8.2 (*)    GFR calc non Af Amer 23 (*)    GFR calc Af Amer 26 (*)    Anion gap 18 (*)    All other components within normal limits  CBC WITH DIFFERENTIAL/PLATELET - Abnormal; Notable for the following components:   RBC 1.83 (*)    Hemoglobin 5.7 (*)    HCT 18.5 (*)    MCV 101.1 (*)    RDW 15.9 (*)    All other components within normal limits  BRAIN NATRIURETIC PEPTIDE - Abnormal; Notable for the following components:   B Natriuretic Peptide 258.0 (*)    All other components within normal limits  SARS CORONAVIRUS 2 (TAT 6-24 HRS)  POC OCCULT BLOOD, ED  TYPE AND SCREEN  PREPARE RBC (CROSSMATCH)  TROPONIN I (HIGH SENSITIVITY)  TROPONIN I (HIGH SENSITIVITY)    EKG EKG Interpretation  Date/Time:  Tuesday February 21 2019 14:23:22 EST Ventricular Rate:  91 PR Interval:    QRS Duration: 140 QT Interval:  443 QTC Calculation: 546 R Axis:   42 Text Interpretation: Sinus rhythm Atrial premature complex Left bundle branch block Confirmed by Virgel Manifold [id number]) on 02/21/2019 3:07:43 PM   Radiology Dg Chest 2 View  Result Date: 02/21/2019 CLINICAL DATA:  Shortness of breath and chest pain for a long time. History of gastroesophageal reflux disease, hypertension and chronic kidney disease. EXAM: CHEST - 2 VIEW COMPARISON:  Radiographs 01/30/2019. CT 11/06/2018. FINDINGS: The heart size and mediastinal contours are stable. There is mild aortic atherosclerosis. There are stable mild emphysematous  changes without superimposed edema, confluent airspace opacity, pleural effusion or pneumothorax. The bones are intact. Telemetry leads overlie the chest. IMPRESSION: Stable chest. No active cardiopulmonary process. Electronically Signed   By: Richardean Sale M.D.   On: 02/21/2019 15:10    Procedures .Critical Care Performed by: Cherre Robins, PA-C Authorized by: Cherre Robins, PA-C   Critical care provider statement:    Critical care time (minutes):  40   Critical care was time spent personally by me on the following activities:  Development of treatment plan with patient or surrogate, discussions with consultants, evaluation of patient's response to treatment, examination of patient, obtaining history from patient or surrogate, review of old charts, re-evaluation of patient's condition, ordering and review of radiographic studies, ordering and review of laboratory studies and ordering and performing treatments and interventions   (including critical care time)  Medications Ordered in ED Medications  0.9 %  sodium chloride infusion (has no administration in time range)  pantoprazole (PROTONIX) injection 40 mg (has no administration in time range)     Initial Impression / Assessment and Plan / ED Course  I have reviewed the triage vital signs and the nursing notes.  Pertinent labs & imaging results that were available during my care of the patient were reviewed by me and considered in my medical decision making (  see chart for details).  Patient seen and examined. She is afebrile, normotensive on arrival. No hypoxia or tachycardia. She is is pale, tachypneic although nontoxic in appearance.  Lungs are clear to auscultation all fields.  No abdominal tenderness, no peritoneal signs.  Bilateral lower extremities with mild bilateral lower extremity edema which she reports is unchanged.  DRE performed with chaperone present.  No gross melena on exam.  However patient is Hemoccult  positive.  Blood work today shows hemoglobin of 5.7, is compared to 12 days ago and it was 8.5.  BNP also elevated at 258.  Creatinine is also elevated at 2.08 compared to lab work when it was 1.41 which appears to be closer to her baseline.  First troponin is 14, second troponin is pending.  Chest x-ray viewed by me without filtrate, no pulmonary congestion, no pneumothorax.  Fluids not started at this time as she has LVEF of 30%. Discussed with Dr. Grandville Silos of family medicine service.  He is recommending GI consult and starting IV Protonix for possible GI bleed as there is no obvious source. Plan to transfuse, type and screen pending. This case was discussed with Dr. Wilson Singer who has seen the patient and agrees with plan of care.  Patient has seen Dr. Collene Mares GI in the past.  Case discussed with Dr. Collene Mares who reviewed patient's chart and saw that she had thorough GI work-up in July for similar presentation.  She is recommending capsule study to be done tomorrow.  Dr. Benson Norway will be in tomorrow to see the patient. Dr. Collene Mares plans to contact Dr. Grandville Silos and discuss further.     Portions of this note were generated with [employer]. Dictation errors may occur despite best attempts at proofreading.   Final Clinical Impressions(s) / ED Diagnoses   Final diagnoses:  Symptomatic anemia  AKI (acute kidney injury) [facility])    ED Discharge Orders    None       Flint Melter 02/21/19 1825    Virgel Manifold, MD 02/22/19 [phone]

## 2019-02-21 NOTE — ED Notes (Signed)
Notified Dr. Ouida Sills of patients hypotension 84/51 (map 61). Also informed Dr. Ouida Sills of patient have antibodies in blood sample, will take additional time for blood bank to process type and screen.

## 2019-02-21 NOTE — ED Notes (Signed)
Help get patient undress on the monitor ekg was shown to Dr Wilson Singer patient is resting with call bell in reach

## 2019-02-21 NOTE — Progress Notes (Signed)
Patient is asymptomatic with her BP of 84/51 and map 61. Due to the patient's CHF with EF 30% and CKD will hold off on giving any fluids for now. Blood products have been matched with patient's blood type and her Antibodies and RN Marguarite Arbour will start the transfusion.  Please continue to monitor patient's BP and respiratory status as the blood product administration can still cause patient to become fluid overloaded!!    Milus Banister, [place], PGY-2 02/21/2019 6:14 PM

## 2019-02-21 NOTE — ED Notes (Signed)
Date and time results received: 02/21/19  (use smartphrase ".now" to insert current time)  Test: hgb Critical Value: 5.7  Name of Provider Notified: Gailen Shelter., PA  Orders Received? Or Actions Taken?: Orders Received - See Orders for details

## 2019-02-21 NOTE — H&P (Signed)
[facility] Admission History and Physical Service Pager: [phone]  [name] Medical record number: [id number] Date of birth: Mar 23, 1946 Age: 73 y.o. Gender: female  Primary Care Provider: [name], MD Consultants: Gastroenterology Code Status: Full Primary contact: [name], [phone]  Chief Complaint: Fatigue  Assessment and Plan: Hannah [name] is a 73 y.o. female presenting with 1 month of worsening fatigue. 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   Symptomatic anemia likely secondary to GI bleed Patient with a history of GI bleed in the past, most recently in August 2020.  Patient recently started on aspirin and Plavix status post mitral valve replacement on 10/15.  Patient also had few days of melena per daughter.  Hemoglobin 5.7 on admission, this is down from 8.5 on 10/22.  Baseline hemoglobin is between 8 and 9 due to anemia of chronic disease.  FOBT positive type and screen found to have antibodies, delaying start of process.  GI consult in the ED, evaluation still pending.  Patient made n.p.o. and IV Protonix started.  Patient had EGD and colonoscopy in 10/2018. EGD showed small bleeding polyp in the duodenal bulb that was cauterized, and nonbleeding angioectasia that was cauterized.  Colonoscopy showed diverticulosis and a few polyps that were cauterized. -Admit to [facility], attending Dr. Gwendlyn Deutscher -Cardiac monitoring -Transfuse PRBC, 2 units, ordered in the ED -Posttransfusion H&H -GI recommendations appreciated -IV Protonix   -N.p.o.  -Hold aspirin and Plavix -PT/OT eval and treat  Hypotension Patient BP on admission did drop to 84/51.  Asymptomatic and mentating well.  Holding patient's Lasix, losartan, and metoprolol.  Patient does not appear volume overloaded, but given heart failure want to be gentle with giving fluids.  Will monitor blood pressure closely and see if this improves with giving pRBC transfusion. -Monitor blood pressure closely -Low threshold to start gentle fluids versus small normal saline bolus  HFrEF status post recent mitral valve replacement 01/2019: Echocardiogram in 01/2019 with EF of 30-35%.  Patient does not appear to be in heart failure exacerbation or volume overloaded.  CXR no acute cardiopulmonary findings.  BNP at baseline at approximately 250.  Lungs are clear.  Patient is on 2 L of oxygen for comfort.  Satting well.  This is likely due to the anemia.  Patient does not have any increased lower extremity edema.  Suspect this will improve after getting blood products.  Patient takes Lasix 60 mg twice daily, losartan 25 mg, metoprolol XL 25 mg.  Troponin negative x1.  EKG sinus rhythm, LBBB (chronic), PACs.  -Holding losartan, metoprolol, Lasix -Strict I's and O's -Daily weights -Follow-up second troponin -Monitor volume status closely when giving blood products  AKI on CKD stage IIIb: Recent creatinine baseline appears to be around 1.3-1.4.  Patient creatinine on admission 2.0.  Possibly due to anemia.  Will likely improve with fluids and blood products -Trend BMP / urinary output -Hold losartan -Avoid nephrotoxic agents, ensure adequate renal perfusion  Hypertension: Hypotensive on admission.  Please see above  Anemia of chronic disease: Hemoglobin baseline between 8 and 9.  Patient on iron supplements at home. -Anemia panel -Transfusion threshold >7.  -Daily CBC  HLD: Patient on home atorvastatin 40 mg -  Atorvastatin 40 mg  Insomnia -Continue home  trazodone  Dementia: Patient poor historian.  Please speak to daughter regarding history and ongoing care.  FEN/GI: NPO Prophylaxis: SCDs,   Disposition: Inpatient Admission  History of Present Illness:  Hannah [name] is a 73 y.o. female presenting with 2 weeks of fatigue.  Patient has severe dementia and denied being on any blood thinners including aspirin and Plavix.  Also denied dark tarry stools.  Spoke with daughter [name], the patient's primary caregiver.  She states that the patient has been on aspirin and Plavix since October 17.  She also says the patient has had dark watery stools over the past 3 to 4 days.  Denies any shortness of breath, chest pain, fevers, chills, cough, sick contacts.  In the ED, patient was found to have hemoglobin of 5.7.  FOBT positive.  GI consult in the ED, recommendations are pending.  Patient was started on Protonix IV.  Made n.p.o.  Blood products ordered for transfusion, but antibodies were found in the blood sample delaying blood bank process of type and screen.  Patient had mild hypotension 84/51 with map of 61.  Patient was asymptomatic.  Review Of Systems: Per HPI with the following additions:    Patient Active Problem List   Diagnosis Date Noted  . Anemia 02/21/2019  . Hypokalemia 02/03/2019  . S/P mitral valve repair   . Nonrheumatic mitral valve regurgitation 02/02/2019  . CKD (chronic kidney disease) stage 3, GFR 30-59 ml/min   . Anemia of chronic disease   . Mitral valve insufficiency   . Vitamin B12 deficiency 12/28/2018  . Acute on chronic combined systolic and diastolic CHF (congestive heart failure) ([place])   . Gastric bleed   . 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 Gout 09/11/2015  . OBESITY 03/26/2010  . Essential hypertension 08/09/2008  . GERD 08/09/2008    Past Medical History: Past Medical History:  Diagnosis Date  . Anemia   . Anemia of chronic disease   . Anxiety   .  Arthritis    "hands" (05/09/2015)  . Chronic diastolic heart failure ([place])   . CKD (chronic kidney disease) stage 3, GFR 30-59 ml/min   . Dementia ([place])   . Elevated TSH 02/03/2018  . GERD (gastroesophageal reflux disease)   . HLD (hyperlipidemia)   . Hypertension   . LBBB (left bundle branch block)   . Osteoarthritis   . 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 S/P mitral valve repair    s/p MitraClip on 02/03/19  . Vitamin B12 deficiency 12/28/2018    Past Surgical History: Past Surgical History:  Procedure Laterality Date  . BIOPSY  10/28/2018   Procedure: BIOPSY;  Surgeon: Juanita Craver, MD;  Location[facility]COPY;  Service: Endoscopy;;  . CARDIAC CATHETERIZATION  05/2004  . COLONOSCOPY  07/2002   Archie Endo 09/02/2010  . COLONOSCOPY WITH PROPOFOL N/A 10/28/2018   Procedure: COLONOSCOPY WITH PROPOFOL;  Surgeon: Juanita Craver, MD;  Location[facility] ENDOSCOPY;  Service: Endoscopy;  Laterality: N/A;  . ESOPHAGOGASTRODUODENOSCOPY  07/2002   w/biopsies/notes 09/02/2010  . ESOPHAGOGASTRODUODENOSCOPY (EGD) WITH PROPOFOL N/A 10/28/2018   Procedure: ESOPHAGOGASTRODUODENOSCOPY (EGD) WITH PROPOFOL;  Surgeon: Juanita Craver, MD;  Location: [facility] ENDOSCOPY;  Service: Endoscopy;  Laterality: N/A;  . HOT HEMOSTASIS N/A 10/28/2018   Procedure: HOT HEMOSTASIS (ARGON PLASMA COAGULATION/BICAP);  Surgeon: Juanita Craver, MD;  Location: [facility] ENDOSCOPY;  Service: Endoscopy;  Laterality: N/A;  . IR ANGIOGRAM SELECTIVE EACH ADDITIONAL VESSEL  10/30/2018  . IR ANGIOGRAM  SELECTIVE EACH ADDITIONAL VESSEL  10/30/2018  . IR ANGIOGRAM SELECTIVE EACH ADDITIONAL VESSEL  10/30/2018  . IR ANGIOGRAM SELECTIVE EACH ADDITIONAL VESSEL  10/30/2018  . IR ANGIOGRAM VISCERAL SELECTIVE  10/30/2018  . IR EMBO ART  VEN HEMORR LYMPH EXTRAV  INC GUIDE ROADMAPPING  10/30/2018  . IR US GUIDANCE  10/30/2018  . MITRAL VALVE REPAIR N/A 02/02/2019   Procedure: MITRAL VALVE REPAIR;  Surgeon: Sherren Mocha, MD;  Location: [facility] CV LAB;  Service: Cardiovascular;   Laterality: N/A;  . POLYPECTOMY  10/28/2018   Procedure: POLYPECTOMY;  Surgeon: Juanita Craver, MD;  Location: [facility] ENDOSCOPY;  Service: Endoscopy;;  . RIGHT/LEFT HEART CATH AND CORONARY ANGIOGRAPHY N/A 12/29/2018   Procedure: RIGHT/LEFT HEART CATH AND CORONARY ANGIOGRAPHY;  Surgeon: Leonie Man, MD;  Location: [place] CV LAB;  Service: Cardiovascular;  Laterality: N/A;  . TEE WITHOUT CARDIOVERSION N/A 12/30/2018   Procedure: TRANSESOPHAGEAL ECHOCARDIOGRAM (TEE);  Surgeon: Lelon Perla, MD;  Location: [facility] ENDOSCOPY;  Service: Cardiovascular;  Laterality: N/A;    Social History: Social History   Tobacco Use  . Smoking status: Former Smoker    Packs/day: 0.30    Years: 8.00    Pack years: 2.40    Types: Cigarettes    Quit date: 05/30/2004    Years since quitting: 14.7  . Smokeless tobacco: Never Used  Substance Use Topics  . Alcohol use: No    Alcohol/week: 0.0 standard drinks  . Drug use: Never   Additional social history: Daughter [name] is primary caregiver. Please also refer to relevant sections of EMR.  Family History: Family History  Problem Relation Age of Onset  . Heart disease Mother        died at age 69  . Breast cancer Sister   . Cancer Sister        Breast  . Hypertension Father   . Hypertension Sister      Allergies and Medications: No Known Allergies No current facility-administered medications on file prior to encounter.    Current Outpatient Medications on File Prior to Encounter  Medication Sig Dispense Refill  . acetaminophen (TYLENOL) 325 MG tablet Take 650 mg by mouth every 6 (six) hours as needed for mild pain or headache.    Marland Kitchen aspirin 81 MG chewable tablet Chew 1 tablet (81 mg total) by mouth daily.    Marland Kitchen atorvastatin (LIPITOR) 40 MG tablet Take 1 tablet (40 mg total) by mouth daily. 90 tablet 3  . clopidogrel (PLAVIX) 75 MG tablet Take 1 tablet (75 mg total) by mouth daily with breakfast. 90 tablet 1  . cyanocobalamin 1000 MCG tablet  Take 1 tablet (1,000 mcg total) by mouth daily. 30 tablet 0  . ferrous sulfate 324 MG TBEC Take 324 mg by mouth.    . furosemide (LASIX) 40 MG tablet Take 1.5 tablets (60 mg total) by mouth 2 (two) times daily. 60 tablet 0  . losartan (COZAAR) 25 MG tablet Take 1 tablet (25 mg total) by mouth daily. 30 tablet 11  . metoprolol succinate (TOPROL-XL) 25 MG 24 hr tablet Take 1 tablet (25 mg total) by mouth daily. Take with or immediately following a meal. 90 tablet 3  . Multiple Vitamins-Minerals (ADULT ONE DAILY GUMMIES) CHEW Chew 1 tablet by mouth daily.    . pantoprazole (PROTONIX) 40 MG tablet Take 1 tablet (40 mg total) by mouth daily. 90 tablet 1  . traZODone (DESYREL) 50 MG tablet Take 0.5 tablets (25 mg total) by mouth at bedtime  as needed for sleep. 30 tablet 0    Objective: BP (!) 104/48 (BP Location: Right Arm)   Pulse 88   Temp 97.8 F (36.6 C) (Oral)   Resp 20   SpO2 95%  Exam: Physical Exam Constitutional:      General: She is not in acute distress.    Appearance: She is well-developed. She is not ill-appearing.  HENT:     Head: Normocephalic and atraumatic.     Mouth/Throat:     Pharynx: No oropharyngeal exudate.  Eyes:     Extraocular Movements: Extraocular movements intact.     Pupils: Pupils are equal, round, and reactive to light.     Comments: Pale conjuctiva  Neck:     Musculoskeletal: Normal range of motion.     Vascular: No JVD.  Cardiovascular:     Rate and Rhythm: Normal rate and regular rhythm.     Heart sounds: No murmur. No friction rub. No gallop.   Pulmonary:     Effort: Pulmonary effort is normal. No respiratory distress.     Breath sounds: Normal breath sounds. No decreased breath sounds.  Abdominal:     Palpations: Abdomen is soft.     Tenderness: There is no abdominal tenderness.  Genitourinary:    Rectum: Guaiac result positive.  Musculoskeletal: Normal range of motion.     Right lower leg: No edema.     Left lower leg: No edema.  Skin:     General: Skin is warm and dry.     Capillary Refill: Capillary refill takes less than 2 seconds.     Findings: No rash.  Neurological:     General: No focal deficit present.     Mental Status: She is alert and oriented to person, place, and time.     Cranial Nerves: No cranial nerve deficit.     Motor: No weakness.  Psychiatric:        Mood and Affect: Mood normal. Mood is not anxious.        Behavior: Behavior normal. Behavior is not agitated.      Labs and Imaging: CBC BMET  Recent Labs  Lab 02/21/19 1530  WBC 5.5  HGB 5.7*  HCT 18.5*  PLT 365   Recent Labs  Lab 02/21/19 1530  NA 139  K 3.5  CL 99  CO2 22  BUN 46*  CREATININE 2.08*  GLUCOSE 119*  CALCIUM 8.2Bonnita Hollow, MD 02/21/2019, 5:20 PM PGY-3, [facility] pager: [phone], text pages welcome

## 2019-02-21 NOTE — Discharge Summary (Addendum)
I have reviewed the below documentation. I have seen the patient on the day of discharge and agree with the below exam.  In addition to below, I recommend the following at follow up:  Monitor weights and breathing. If gains 3 pounds in day or 5 pounds in week, can give additional dose of Lasix.   Repeat CBC in 1 week.   Lasix dose--- Dr. Kris Mouton to call [facility] to confirm dose of 40 mg daily, with titration.  In addition the patient's baseline mental status is alert and oriented only to person.  Does not usually know the date or where she is.  She can easily be distracted and is very talkative.  She is easily redirectable.  Encourage increased daytime activities, quiet restful sleep and avoidance of deliriogenic medications for this patient.  Discharged to [facility].   [name], MD  [facility] Discharge Summary  [name] Medical record number: [id number] Date of birth: Oct 22, 1945 Age: 73 y.o. Gender: female  Date of Admission: 02/21/2019  Date of Discharge: 02/28/2019 Admitting Physician: [name], MD  Primary Care Provider: [name], MD Consultants: GI  Indication for Hospitalization: Symptomatic Anemia 2/2 GI Bleed  Discharge Diagnoses/Problem List:  Patient Active Problem List   Diagnosis Date Noted  . Adverse effect of antiplatelet agent 02/22/2019  . GI bleed 02/21/2019  . Hypokalemia 02/03/2019  . S/P mitral valve repair   . CKD (chronic kidney disease) stage 3, GFR 30-59 ml/min   . Anemia of chronic disease   . Vitamin B12 deficiency 12/28/2018  . Acute on chronic combined systolic and diastolic CHF (congestive heart failure) ([place])   . AKI (acute kidney injury) ([place])   . Gastric bleed   . Dementia due to atherosclerosis with behavioral disturbance ([place]) 02/02/2018  . Gout 09/11/2015  . Symptomatic anemia 05/08/2015  . Essential hypertension  08/09/2008  . GERD 08/09/2008   Disposition: Discharge to SNF  Discharge Condition: Stable, Improved  Discharge Exam:  General: very pleasant elderly woman,WN,WD, NAD, sitting up in bed CV: RRR, no m/r/g, no LE edema Lungs: CTAB Abdomen: soft, NT, ND, normal bowel sounds Skin: warm, dry Extremities: warm and well perfused MSK: gait slow but without difficulty Neuro: pleasantly demented  Taken from Dr. Jerrol Banana note on 11/10  [facility] Course:  Ms. Maranan was admitted on 02/21/2019 for symptomatic anemia secondary to a GI bleed.  Anemia 2/2 GI bleed FOBT was positive, and hemoglobin was 5.7.  GI was consulted. Patient was made n.p.o., IV Protonix was started, and she received 2 units of pRBCs with a resulting hemoglobin of 8.0. Her hemoglobin was 7.6 on the am of 11/5 and received 2 additional units of pRBCs which results in a hemoglobin of 10.5. Enteroscopy was performed on 11/6 showing normal esophagus, normal stomach, normal duodenum; however, multiple angiodysplastic lesions with stigmata of recent bleeding were found in the proximal jejunum. Coagulation was successful.  She was recommended to continue PPI 40mg  daily. Patient's hemoglobin remained around 11 for 3 days prior to discharge.  Mitral Valve clip Patient had been started on plavix and aspirin after a mitral valve Clip on 02/02/2019. As it was a clip the decision was made to stop the plavix altogether. The patient was discharged on aspirin monotherapy.  AKI on CKDIIIb The patient's losartan was held during entirety of admission. Her home lasix 60mg  bid was reduced down to 40mg  daily due to an AKI. Her  creatinine had increased up to 2 on 11/9 (BL~1.3). Her lasix dose was adjusted to 40mg  daily, with corresponding fall in creatinien to 1.34.  Issues for Follow Up:  1. Patient can continue with monotherapy with ASA when able to resume, no DAPT! 2. Continue taking Pantoprazole or Omeprazole 40mg  PO daily to prevent GI  bleed. 3. Follow up with GI 1-2 weeks after discharge (around March 14, 2019) 4. Monitor daily weight every morning. Maintain patient on Lasix 40mg  daily. If her weight increases by 3-5 lbs, add additional dose of Lasix 40mg .  5. Consider potassium recheck in 1 week, if K ok would consider restarting home losartan  Significant Procedures:  02/24/19 - Enteroscopy   Significant Labs and Imaging:  Recent Labs  Lab 02/26/19 0422 02/27/19 0517 02/28/19 0454  WBC 5.9 5.6 5.3  HGB 12.1 11.0* 10.7*  HCT 37.4 35.3* 34.2*  PLT 265 244 233   Recent Labs  Lab 02/24/19 0431 02/25/19 0345 02/26/19 0422 02/27/19 0517 02/28/19 0454  NA 140 143 141 141 139  K 3.5 3.2* 3.4* 3.7 3.6  CL 108 106 102 102 102  CO2 24 26 27 26 26   GLUCOSE 109* 98 114* 108* 111*  BUN 22 19 25* 33* 30*  CREATININE 1.32* 1.34* 1.50* 1.96* 1.34*  CALCIUM 8.4* 8.6* 8.9 8.5* 8.6*  MG 2.2  --  1.5* 1.6* 2.0  PHOS  --   --   --  5.0* 4.4  ALBUMIN  --   --   --  3.2* 3.2*    Results/Tests Pending at Time of Discharge: none  Discharge Medications:  Allergies as of 02/28/2019   No Known Allergies     Medication List    STOP taking these medications   Adult One Daily Gummies Chew   clopidogrel 75 MG tablet Commonly known as: PLAVIX   losartan 25 MG tablet Commonly known as: Cozaar   traZODone 50 MG tablet Commonly known as: DESYREL     TAKE these medications   acetaminophen 325 MG tablet Commonly known as: TYLENOL Take 650 mg by mouth every 6 (six) hours as needed for mild pain or headache.   aspirin 81 MG chewable tablet Chew 1 tablet (81 mg total) by mouth daily.   atorvastatin 40 MG tablet Commonly known as: LIPITOR Take 1 tablet (40 mg total) by mouth daily.   cyanocobalamin 1000 MCG tablet Take 1 tablet (1,000 mcg total) by mouth daily.   ferrous sulfate 324 MG Tbec Take 324 mg by mouth daily as needed.   furosemide 40 MG tablet Commonly known as: LASIX Take 1 tablet (40 mg total)  by mouth daily. What changed:   how much to take  when to take this   metoprolol succinate 25 MG 24 hr tablet Commonly known as: Toprol XL Take 1 tablet (25 mg total) by mouth daily. Take with or immediately following a meal.   pantoprazole 40 MG tablet Commonly known as: PROTONIX Take 1 tablet (40 mg total) by mouth daily.   ramelteon 8 MG tablet Commonly known as: ROZEREM Take 1 tablet (8 mg total) by mouth at bedtime.     Discharge Instructions: Please refer to Patient Instructions section of EMR for full details.  Patient was counseled important signs and symptoms that should prompt return to medical care, changes in medications, dietary instructions, activity restrictions, and follow up appointments.   Follow-Up Appointments: Contact information for after-discharge care    Destination    [facility] .  Service: Skilled Nursing Contact information: [address] [place] [phone]            - Cardiology 03/02/2019 at 3:30pm - GI follow up around 03/14/2019  Guadalupe Dawn, MD 02/28/2019, 5:49 PM PGY-3, [place]

## 2019-02-22 ENCOUNTER — Encounter (HOSPITAL_COMMUNITY): Payer: Self-pay | Admitting: Family Medicine

## 2019-02-22 DIAGNOSIS — K922 Gastrointestinal hemorrhage, unspecified: Secondary | ICD-10-CM

## 2019-02-22 DIAGNOSIS — N179 Acute kidney failure, unspecified: Secondary | ICD-10-CM

## 2019-02-22 DIAGNOSIS — T457X5A Adverse effect of anticoagulant antagonists, vitamin K and other coagulants, initial encounter: Secondary | ICD-10-CM | POA: Diagnosis present

## 2019-02-22 DIAGNOSIS — D649 Anemia, unspecified: Secondary | ICD-10-CM

## 2019-02-22 LAB — BASIC METABOLIC PANEL
Anion gap: 12 (ref 5–15)
BUN: 43 mg/dL — ABNORMAL HIGH (ref 8–23)
CO2: 25 mmol/L (ref 22–32)
Calcium: 8.5 mg/dL — ABNORMAL LOW (ref 8.9–10.3)
Chloride: 101 mmol/L (ref 98–111)
Creatinine, Ser: 1.83 mg/dL — ABNORMAL HIGH (ref 0.44–1.00)
GFR calc Af Amer: 31 mL/min — ABNORMAL LOW (ref >60)
GFR calc non Af Amer: 27 mL/min — ABNORMAL LOW (ref >60)
Glucose, Bld: 104 mg/dL — ABNORMAL HIGH (ref 70–99)
Potassium: 3.1 mmol/L — ABNORMAL LOW (ref 3.5–5.1)
Sodium: 138 mmol/L (ref 135–145)

## 2019-02-22 LAB — CBC WITH DIFFERENTIAL/PLATELET
Abs Immature Granulocytes: 0.02 10*3/uL (ref 0.00–0.07)
Basophils Absolute: 0 10*3/uL (ref 0.0–0.1)
Basophils Relative: 0 %
Eosinophils Absolute: 0 10*3/uL (ref 0.0–0.5)
Eosinophils Relative: 0 %
HCT: 24.8 % — ABNORMAL LOW (ref 36.0–46.0)
Hemoglobin: 8 g/dL — ABNORMAL LOW (ref 12.0–15.0)
Immature Granulocytes: 0 %
Lymphocytes Relative: 28 %
Lymphs Abs: 1.8 10*3/uL (ref 0.7–4.0)
MCH: 29.3 pg (ref 26.0–34.0)
MCHC: 32.3 g/dL (ref 30.0–36.0)
MCV: 90.8 fL (ref 80.0–100.0)
Monocytes Absolute: 0.5 10*3/uL (ref 0.1–1.0)
Monocytes Relative: 7 %
Neutro Abs: 4.2 10*3/uL (ref 1.7–7.7)
Neutrophils Relative %: 65 %
Platelets: 300 10*3/uL (ref 150–400)
RBC: 2.73 MIL/uL — ABNORMAL LOW (ref 3.87–5.11)
RDW: 19.8 % — ABNORMAL HIGH (ref 11.5–15.5)
WBC: 6.5 10*3/uL (ref 4.0–10.5)
nRBC: 0 % (ref 0.0–0.2)

## 2019-02-22 MED ORDER — POTASSIUM CHLORIDE CRYS ER 20 MEQ PO TBCR
40.0000 meq | EXTENDED_RELEASE_TABLET | Freq: Once | ORAL | Status: AC
  Administered 2019-02-22: 40 meq via ORAL
  Filled 2019-02-22: qty 2

## 2019-02-22 MED ORDER — LORAZEPAM 2 MG/ML IJ SOLN
INTRAMUSCULAR | Status: AC
  Filled 2019-02-22: qty 1

## 2019-02-22 MED ORDER — LORAZEPAM 1 MG PO TABS: 2.0000 mg | ORAL_TABLET | Freq: Once | ORAL | Status: AC

## 2019-02-22 MED ORDER — FENTANYL CITRATE (PF) 100 MCG/2ML IJ SOLN
25.0000 ug | Freq: Once | INTRAMUSCULAR | Status: AC
  Administered 2019-02-22: 25 ug via INTRAVENOUS
  Filled 2019-02-22: qty 2

## 2019-02-22 MED ORDER — SODIUM CHLORIDE 0.9 % IV SOLN
INTRAVENOUS | Status: DC
  Administered 2019-02-22 – 2019-02-23 (×3): via INTRAVENOUS

## 2019-02-22 MED ORDER — TRAZODONE HCL 50 MG PO TABS
25.0000 mg | ORAL_TABLET | Freq: Every day | ORAL | Status: DC
  Administered 2019-02-22: 25 mg via ORAL

## 2019-02-22 MED ORDER — LORAZEPAM 2 MG/ML IJ SOLN
2.0000 mg | Freq: Once | INTRAMUSCULAR | Status: AC
  Administered 2019-02-22: 2 mg via INTRAMUSCULAR

## 2019-02-22 MED ORDER — MAGNESIUM SULFATE 2 GM/50ML IV SOLN
2.0000 g | Freq: Once | INTRAVENOUS | Status: AC
  Administered 2019-02-22: 2 g via INTRAVENOUS
  Filled 2019-02-22 (×2): qty 50

## 2019-02-22 MED ORDER — PANTOPRAZOLE SODIUM 40 MG IV SOLR
40.0000 mg | Freq: Two times a day (BID) | INTRAVENOUS | Status: DC
  Administered 2019-02-22 – 2019-02-24 (×6): 40 mg via INTRAVENOUS
  Filled 2019-02-22 (×6): qty 40

## 2019-02-22 MED ORDER — RAMELTEON 8 MG PO TABS
8.0000 mg | ORAL_TABLET | Freq: Every day | ORAL | Status: DC
  Administered 2019-02-22 – 2019-02-26 (×5): 8 mg via ORAL
  Filled 2019-02-22 (×7): qty 1

## 2019-02-22 NOTE — ED Notes (Signed)
Previous RN states blood consent in medical records draw and was done verbally with daughter last night.

## 2019-02-22 NOTE — ED Notes (Signed)
Admitting paged to RN per her request 

## 2019-02-22 NOTE — ED Notes (Signed)
Pt refuses post blood vitals

## 2019-02-22 NOTE — ED Notes (Signed)
Pt attempting to remove IV, states it is "just glass in her arm and she wants it out". RN told pt that IV had to stay in place for her to receive fluids. Will attempt to cover IV with dressing.

## 2019-02-22 NOTE — Consult Note (Signed)
Reason for Consult: Symptomatic anemia Referring Physician: Teaching Service.  Hannah [name] HPI: This is a 73 year old female with severe MR, stage IV CKD, dementia, HTN, and LBBB.  The history was obtained from her daughter.  She reports that she started to have abdominal cramping this past Saturday.  Her daughter noted that her stool was dark and loose.  Over the weekend she started to have more fatigue and she was not able to ambulated without severe SOB.  Another dark stool was noted and this time she was found to have some blood in her stool.  As a result of her symptoms she was admitted to the hospital for further evaluation and treatment.  Her admission HGB was at 5.7 g/dL which is a drop from the 8-9 g/dL baseline.  In July of this year she underwent an EGD and colonoscopy by Dr. Collene Mares to work up her IDA, but there was no clear source of bleeding.  Biopsies of the stomach showed parietal cell hyperplasia and she had a couple of adenomas.  Recently she underwent a mitral valve repair on 02/02/2019.  She has a mildly elevated BNP upon admission.  Past Medical History:  Diagnosis Date  . Anemia   . Anemia of chronic disease   . Anxiety   . Arthritis    "hands" (05/09/2015)  . Chronic diastolic heart failure ([place])   . CKD (chronic kidney disease) stage 3, GFR 30-59 ml/min   . Dementia ([place])   . Elevated TSH 02/03/2018  . GERD (gastroesophageal reflux disease)   . HLD (hyperlipidemia)   . Hypertension   . LBBB (left bundle branch block)   . Osteoarthritis   . 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 S/P mitral valve repair    s/p MitraClip on 02/03/19  . Vitamin B12 deficiency 12/28/2018    Past Surgical History:  Procedure Laterality Date  . BIOPSY  10/28/2018   Procedure: BIOPSY;  Surgeon: Juanita Craver, MD;  Location: [facility] ENDOSCOPY;  Service: Endoscopy;;  . CARDIAC CATHETERIZATION  05/2004  . COLONOSCOPY  07/2002   Archie Endo 09/02/2010  . COLONOSCOPY WITH PROPOFOL N/A 10/28/2018   Procedure:  COLONOSCOPY WITH PROPOFOL;  Surgeon: Juanita Craver, MD;  Location: [facility] ENDOSCOPY;  Service: Endoscopy;  Laterality: N/A;  . ESOPHAGOGASTRODUODENOSCOPY  07/2002   w/biopsies/notes 09/02/2010  . ESOPHAGOGASTRODUODENOSCOPY (EGD) WITH PROPOFOL N/A 10/28/2018   Procedure: ESOPHAGOGASTRODUODENOSCOPY (EGD) WITH PROPOFOL;  Surgeon: Juanita Craver, MD;  Location: [facility] ENDOSCOPY;  Service: Endoscopy;  Laterality: N/A;  . HOT HEMOSTASIS N/A 10/28/2018   Procedure: HOT HEMOSTASIS (ARGON PLASMA COAGULATION/BICAP);  Surgeon: Juanita Craver, MD;  Location[facility] ENDOSCOPY;  Service: Endoscopy;  Laterality: N/A;  . IR ANGIOGRAM SELECTIVE EACH ADDITIONAL VESSEL  10/30/2018  . IR ANGIOGRAM SELECTIVE EACH ADDITIONAL VESSEL  10/30/2018  . IR ANGIOGRAM SELECTIVE EACH ADDITIONAL VESSEL  10/30/2018  . IR ANGIOGRAM SELECTIVE EACH ADDITIONAL VESSEL  10/30/2018  . IR ANGIOGRAM VISCERAL SELECTIVE  10/30/2018  . IR EMBO ART  VEN HEMORR LYMPH EXTRAV  INC GUIDE ROADMAPPING  10/30/2018  . IR US GUIDANCE  10/30/2018  . MITRAL VALVE REPAIR N/A 02/02/2019   Procedure: MITRAL VALVE REPAIR;  Surgeon: Sherren Mocha, MD;  Location: [place] CV LAB;  Service: Cardiovascular;  Laterality: N/A;  . POLYPECTOMY  10/28/2018   Procedure: POLYPECTOMY;  Surgeon: Juanita Craver, MD;  Location: [facility] ENDOSCOPY;  Service: Endoscopy;;  . RIGHT/LEFT HEART CATH AND CORONARY ANGIOGRAPHY N/A 12/29/2018   Procedure: RIGHT/LEFT HEART CATH AND CORONARY ANGIOGRAPHY;  Surgeon: Leonie Man,  MD;  Location: [place] CV LAB;  Service: Cardiovascular;  Laterality: N/A;  . TEE WITHOUT CARDIOVERSION N/A 12/30/2018   Procedure: TRANSESOPHAGEAL ECHOCARDIOGRAM (TEE);  Surgeon: Lelon Perla, MD;  Location: [facility] ENDOSCOPY;  Service: Cardiovascular;  Laterality: N/A;    Family History  Problem Relation Age of Onset  . Heart disease Mother        died at age 5  . Breast cancer Sister   . Cancer Sister        Breast  . Hypertension Father   . Hypertension Sister      Social History:  reports that she quit smoking about 14 years ago. Her smoking use included cigarettes. She has a 2.40 pack-year smoking history. She has never used smokeless tobacco. She reports that she does not drink alcohol or use drugs.  Allergies: No Known Allergies  Medications:  Scheduled: . atorvastatin  40 mg Oral Daily  . multivitamin with minerals  1 tablet Oral Daily  . traZODone  25 mg Oral QHS  . cyanocobalamin  1,000 mcg Oral Daily   Continuous:   Results for orders placed or performed during the hospital encounter of 02/21/19 (from the past 24 hour(s))  Basic metabolic panel     Status: Abnormal   Collection Time: 02/21/19  3:30 PM  Result Value Ref Range   Sodium 139 135 - 145 mmol/L   Potassium 3.5 3.5 - 5.1 mmol/L   Chloride 99 98 - 111 mmol/L   CO2 22 22 - 32 mmol/L   Glucose, Bld 119 (H) 70 - 99 mg/dL   BUN 46 (H) 8 - 23 mg/dL   Creatinine, Ser 2.08 (H) 0.44 - 1.00 mg/dL   Calcium 8.2 (L) 8.9 - 10.3 mg/dL   GFR calc non Af Amer 23 (L) >60 mL/min   GFR calc Af Amer 26 (L) >60 mL/min   Anion gap 18 (H) 5 - 15  CBC with Differential     Status: Abnormal   Collection Time: 02/21/19  3:30 PM  Result Value Ref Range   WBC 5.5 4.0 - 10.5 K/uL   RBC 1.83 (L) 3.87 - 5.11 MIL/uL   Hemoglobin 5.7 (LL) 12.0 - 15.0 g/dL   HCT 18.5 (L) 36.0 - 46.0 %   MCV 101.1 (H) 80.0 - 100.0 fL   MCH 31.1 26.0 - 34.0 pg   MCHC 30.8 30.0 - 36.0 g/dL   RDW 15.9 (H) 11.5 - 15.5 %   Platelets 365 150 - 400 K/uL   nRBC 0.0 0.0 - 0.2 %   Neutrophils Relative % 64 %   Neutro Abs 3.4 1.7 - 7.7 K/uL   Lymphocytes Relative 26 %   Lymphs Abs 1.4 0.7 - 4.0 K/uL   Monocytes Relative 8 %   Monocytes Absolute 0.5 0.1 - 1.0 K/uL   Eosinophils Relative 2 %   Eosinophils Absolute 0.1 0.0 - 0.5 K/uL   Basophils Relative 0 %   Basophils Absolute 0.0 0.0 - 0.1 K/uL   Immature Granulocytes 0 %   Abs Immature Granulocytes 0.02 0.00 - 0.07 K/uL  Brain natriuretic peptide     Status:  Abnormal   Collection Time: 02/21/19  3:30 PM  Result Value Ref Range   B Natriuretic Peptide 258.0 (H) 0.0 - 100.0 pg/mL  Troponin I (High Sensitivity)     Status: None   Collection Time: 02/21/19  3:30 PM  Result Value Ref Range   Troponin I (High Sensitivity) 14 <18 ng/L  SARS CORONAVIRUS  2 (TAT 6-24 HRS) Nasopharyngeal Nasopharyngeal Swab     Status: None   Collection Time: 02/21/19  4:09 PM   Specimen: Nasopharyngeal Swab  Result Value Ref Range   SARS Coronavirus 2 NEGATIVE NEGATIVE  Type and screen Central     Status: None   Collection Time: 02/21/19  4:26 PM  Result Value Ref Range   ABO/RH(D) O NEG    Antibody Screen POS    Sample Expiration 02/24/2019,2359    Antibody Identification ANTI D    Unit Number [id number]    Blood Component Type RED CELLS,LR    Unit division 00    Status of Unit ISSUED,FINAL    Transfusion Status OK TO TRANSFUSE    Crossmatch Result COMPATIBLE    Unit Number [id number]    Blood Component Type RED CELLS,LR    Unit division 00    Status of Unit ISSUED,FINAL    Transfusion Status OK TO TRANSFUSE    Crossmatch Result COMPATIBLE   Prepare RBC     Status: None   Collection Time: 02/21/19  5:00 PM  Result Value Ref Range   Order Confirmation      ORDER PROCESSED BY BLOOD BANK Performed at [facility], [place] [address] [place], [place] [address]   POC occult blood, ED Provider will collect     Status: Abnormal   Collection Time: 02/21/19  5:02 PM  Result Value Ref Range   Fecal Occult Bld POSITIVE (A) NEGATIVE  Iron and TIBC     Status: Abnormal   Collection Time: 02/21/19  5:30 PM  Result Value Ref Range   Iron 21 (L) 28 - 170 ug/dL   TIBC 347 250 - 450 ug/dL   Saturation Ratios 6 (L) 10.4 - 31.8 %   UIBC 326 ug/dL  Ferritin     Status: None   Collection Time: 02/21/19  5:30 PM  Result Value Ref Range   Ferritin 20 11 - 307 ng/mL  Troponin I (High Sensitivity)     Status: None   Collection Time:  02/21/19  6:03 PM  Result Value Ref Range   Troponin I (High Sensitivity) 16 <18 ng/L     Dg Chest 2 View  Result Date: 02/21/2019 CLINICAL DATA:  Shortness of breath and chest pain for a long time. History of gastroesophageal reflux disease, hypertension and chronic kidney disease. EXAM: CHEST - 2 VIEW COMPARISON:  Radiographs 01/30/2019. CT 11/06/2018. FINDINGS: The heart size and mediastinal contours are stable. There is mild aortic atherosclerosis. There are stable mild emphysematous changes without superimposed edema, confluent airspace opacity, pleural effusion or pneumothorax. The bones are intact. Telemetry leads overlie the chest. IMPRESSION: Stable chest. No active cardiopulmonary process. Electronically Signed   By: Richardean Sale M.D.   On: 02/21/2019 15:10    ROS:  As stated above in the HPI otherwise negative.  Blood pressure (!) 99/46, pulse (!) 102, temperature 98.5 F (36.9 C), temperature source Oral, resp. rate 12, SpO2 98 %.    PE: Gen: NAD, Alert and Oriented HEENT:  [place]/AT, EOMI Neck: Supple, no LAD Lungs: CTA Bilaterally CV: RRR without M/G/R ABM: Soft, NTND, +BS Ext: No C/C/E  Assessment/Plan: 1) Symptomatic anemia. 2) Heme positive stool. 3) S/p mitral valve insufficiency repair.   There is clear evidence that the patient is bleeding.  Further evaluation with a VCE will be performed today.  Pending the results a repeat EGD or an enteroscopy may be necessary.  She is s/p  2 units PRBC, but the updated HGB is not available.  I spoke with the daughter and she consents to her mother undergoing VCE.  Plan: 1) VCE. 2) Follow HGB and transfuse as necessary.  Arriah Wadle D 02/22/2019, 7:53 AM

## 2019-02-22 NOTE — Progress Notes (Signed)
Family Medicine Teaching Service Daily Progress Note Intern Pager: [phone]  [name] Medical record number: [id number] Date of birth: 1946/01/09 Age: 73 y.o. Gender: female  Primary Care Provider: [name], MD Consultants: GI Code Status: Full  Pt Overview and Major Events to Date:  Admitted: 11/03 for anemia 2/2 GI bleed  Assessment and Plan: Hannah [name] is a 73 y.o. female presenting with 1 month of worsening fatigue. PMH is significant for  Anemia of chronic disease, Anxiety, HFrEF, CKD stage 3, GFR 30-59 ml/min, Dementia, GERD, HLD, Hypertension, LBBB, Osteoarthritis, S/P mitral valve repair, and Vitamin B12 deficiency.  Symptomatic anemia likely secondary to GI bleed Patient with a history of GI bleed in the past, most recently in August 2020.  Patient recently started on aspirin and Plavix status post mitral valve replacement on 10/15. Hemoglobin 5.7 on admission,s/p 2U pRBC's. Hgb 8.0 this am. Baseline hemoglobin is between 8-9 due to anemia of chronic disease.   -GI following, appreciate recommendations> for capsule study -IV Protonix BID, NPO -Hold aspirin and Plavix -PT/OTeval and treatonce patient hemodynamically stable  Hypotension, h/o HTN Patient BP on admission did drop to 84/51, still hypotensive this am to 92/37. Asymptomatic and mentating well. Will monitor blood pressure closely. -Holding patient's Lasix, losartan, and metoprolol -50cc/hr NS for low BP  HFrEF status post recent mitral valve replacement 01/2019: Echocardiogram in 01/2019 with EF of30-35%. CXR no acute cardiopulmonary findings. Lungs are clear, on room air. Patient takes Lasix 60 mg twice daily, losartan 25 mg, metoprolol XL 25 mg. Troponin negative x2.  EKG sinus rhythm, LBBB (chronic), PACs.  -Holding losartan, metoprolol, Lasix given hypotension, restart as able -Strict I's and O's, Daily weights -Monitor fluid status closely given IVF  AKIonCKD  stage IIIb: Recent creatinine baseline appears to be around 1.3-1.4.  Patient creatinine on admission 2.0>1.83 this am.  -Trend BMP / urinary output -Replace electrolytes as indicated -Avoid nephrotoxic agents, ensure adequate renal perfusion -Holding losartan, lasix  Anemia of chronic disease: Hemoglobin baseline between 8-9.  Patient on iron supplements at home. -Anemia panel -Transfusion threshold>7. -Daily CBC  HLD: Patient on home atorvastatin 40 mg -Atorvastatin 40 mg  Insomnia -Continue home trazodone  Dementia: Patient poor historian.  Please speak to daughter regarding history and ongoing care.  FEN/GI:NPO, IV protonix BID Prophylaxis:SCDs  Disposition: pending GI work up  Subjective:  Patient confused this morning but is appropriate.  She answers questions appropriately and states that she has not had any further bloody bowel movements.  She states that she is not dizzy or lightheaded.  She has no complaints this morning other than being in the hallway and not being able to sleep well.  Objective: Temp:  [97.5 F (36.4 C)-98.5 F (36.9 C)] 98.5 F (36.9 C) (11/03 2252) Pulse Rate:  [70-102] 102 (11/04 0436) Resp:  [12-28] 12 (11/04 0436) BP: (84-120)/(41-85) 99/46 (11/04 0436) SpO2:  [93 %-100 %] 98 % (11/04 0436) Physical Exam: General: NAD, pleasant Cardiovascular: RRR, no m/r/g, no LE edema Respiratory: CTA BL, normal work of breathing Gastrointestinal: soft, nontender, nondistended MSK: moves 4 extremities equally Derm: no rashes appreciated Psych: appropriate affect  Laboratory: Recent Labs  Lab 02/21/19 1530 02/22/19 0758  WBC 5.5 6.5  HGB 5.7* 8.0*  HCT 18.5* 24.8*  PLT 365 300   Recent Labs  Lab 02/21/19 1530 02/22/19 0758  NA 139 138  K 3.5 3.1*  CL 99 101  CO2 22 25  BUN 46* 43*  CREATININE  2.08* 1.83*  CALCIUM 8.2* 8.5*  GLUCOSE 119* 104*    Imaging/Diagnostic Tests: Dg Chest 2 View  Result Date:  02/21/2019 CLINICAL DATA:  Shortness of breath and chest pain for a long time. History of gastroesophageal reflux disease, hypertension and chronic kidney disease. EXAM: CHEST - 2 VIEW COMPARISON:  Radiographs 01/30/2019. CT 11/06/2018. FINDINGS: The heart size and mediastinal contours are stable. There is mild aortic atherosclerosis. There are stable mild emphysematous changes without superimposed edema, confluent airspace opacity, pleural effusion or pneumothorax. The bones are intact. Telemetry leads overlie the chest. IMPRESSION: Stable chest. No active cardiopulmonary process. Electronically Signed   By: Richardean Sale M.D.   On: 02/21/2019 15:10   Connelly Spruell, [place], DO 02/22/2019, 9:21 AM PGY-3, [place] Intern pager: [phone], text pages welcome

## 2019-02-22 NOTE — ED Notes (Signed)
Pt ambulated to the RR.  

## 2019-02-22 NOTE — ED Notes (Signed)
Pt's daughter, [name], called and asked for an update, pt is now resting. Pt's daughter requested that pt call her when she wakes up.

## 2019-02-22 NOTE — ED Notes (Signed)
Pt refuses to take another dose of trazodone, pt refuses to wear monitor, will not stay in bed, calling 911 from room, pt thinks she is at work and being held against her will, pt tearful, attempting to pull out IV.

## 2019-02-22 NOTE — ED Notes (Signed)
Hannah [name] patients daughter called asking for an update [phone]

## 2019-02-22 NOTE — ED Notes (Signed)
RN contacted staffing and notified by Michelene Heady that patient will not have a Air cabin crew.

## 2019-02-22 NOTE — Evaluation (Signed)
Physical Therapy Evaluation Patient Details Name: Hannah [name] MRN: [id number] DOB: 23-Sep-1945 Today's Date: 02/22/2019   History of Present Illness  ABIAGEAL BLOWE is a 73 y.o. female presenting with 1 month of worsening fatigue. PMH is significant for  Anemia of chronic disease, Anxiety, HFrEF, CKD stage 3, GFR 30-59 ml/min, Dementia, GERD, HLD, Hypertension, LBBB, Osteoarthritis, S/P mitral valve repair, and Vitamin B12 deficiency. Admitted with fatigue/weakness hemoglobin 5.7 now s/p 2 units PRBC and work up for GI bleed.  Clinical Impression  Patient presents likely not far from her functional baseline.  She was not orthostatic and denied symptoms upon rising.  Feel she will benefit from skilled PT while in the acute setting to improve safety awareness, for fall prevention and stair negotiation.  No follow up PT recommendations at this time.      Follow Up Recommendations No PT follow up    Equipment Recommendations  None recommended by PT    Recommendations for Other Services       Precautions / Restrictions Precautions Precautions: Fall      Mobility  Bed Mobility Overal bed mobility: Needs Assistance Bed Mobility: Supine to Sit     Supine to sit: Min guard     General bed mobility comments: for safety from taller stretcher  Transfers Overall transfer level: Needs assistance   Transfers: Sit to/from Stand Sit to Stand: Min guard         General transfer comment: for safety from taller stretcher in hallway  Ambulation/Gait Ambulation/Gait assistance: Min guard Gait Distance (Feet): 350 Feet Assistive device: None Gait Pattern/deviations: Step-through pattern     General Gait Details: mild hip ER with ambulation, no LOB able to demonstrate safety without A in small space in bathroom  Stairs            Wheelchair Mobility    Modified Rankin (Stroke Patients Only)       Balance Overall balance assessment: Mild deficits observed, not  formally tested                                           Pertinent Vitals/Pain Pain Assessment: No/denies pain    Home Living Family/patient expects to be discharged to:: Private residence Living Arrangements: Children Available Help at Discharge: Family;Available 24 hours/day Type of Home: House Home Access: Ramped entrance;Stairs to enter Entrance Stairs-Rails: None Entrance Stairs-Number of Steps: 5 Home Layout: One level Home Equipment: Walker - 2 wheels;Cane - quad;Wheelchair - manual      Prior Function Level of Independence: Needs assistance   Gait / Transfers Assistance Needed: independent PTA  ADL's / Homemaking Assistance Needed: daughter recently moved in with her and assisting wtih IADL's        Hand Dominance   Dominant Hand: Right    Extremity/Trunk Assessment        Lower Extremity Assessment Lower Extremity Assessment: Overall WFL for tasks assessed    Cervical / Trunk Assessment Cervical / Trunk Assessment: Kyphotic  Communication   Communication: No difficulties  Cognition Arousal/Alertness: Awake/alert Behavior During Therapy: WFL for tasks assessed/performed Overall Cognitive Status: No family/caregiver present to determine baseline cognitive functioning                                 General Comments: history of dementia, thought she was at  home and hugging me for cleaning up for her, woke from nap and took time to re-orient      General Comments General comments (skin integrity, edema, etc.): orhtostatics taken initiallysupine 98/39 HR 99; seated 104/45 HR 103, standing 113/60 HR 111; pt asymptomatic    Exercises     Assessment/Plan    PT Assessment Patient needs continued PT services  PT Problem List Decreased mobility;Decreased activity tolerance;Decreased safety awareness       PT Treatment Interventions Stair training;Therapeutic activities;Balance training;Functional mobility training;Gait  training;Patient/family education    PT Goals (Current goals can be found in the Care Plan section)  Acute Rehab PT Goals Patient Stated Goal: to go home PT Goal Formulation: With patient Time For Goal Achievement: 03/01/19 Potential to Achieve Goals: Good    Frequency Min 3X/week   Barriers to discharge        Co-evaluation               AM-PAC PT "6 Clicks" Mobility  Outcome Measure Help needed turning from your back to your side while in a flat bed without using bedrails?: None Help needed moving from lying on your back to sitting on the side of a flat bed without using bedrails?: A Little Help needed moving to and from a bed to a chair (including a wheelchair)?: A Little Help needed standing up from a chair using your arms (e.g., wheelchair or bedside chair)?: A Little Help needed to walk in hospital room?: A Little Help needed climbing 3-5 steps with a railing? : A Little 6 Click Score: 19    End of Session   Activity Tolerance: Patient tolerated treatment well Patient left: in bed(in hallway)   PT Visit Diagnosis: Other abnormalities of gait and mobility (R26.89);Muscle weakness (generalized) (M62.81)    Time: 9678-9381 PT Time Calculation (min) (ACUTE ONLY): 20 min   Charges:   PT Evaluation $PT Eval Low Complexity: [place], [place] Acute Rehabilitation Services [phone] 02/22/2019   Reginia Naas 02/22/2019, 4:50 PM

## 2019-02-22 NOTE — ED Notes (Signed)
Patient verbally upset and tearful. Patient requesting RN to call daughter [name] to come pick her up. RN attempted to call patients daughter but did not answer. Patient refusing vitals and taking gown off in hallway. RN and tech attempting to relax patient. Admitting team in hallway and see patients behavior.

## 2019-02-22 NOTE — ED Notes (Signed)
MD at bedside. 

## 2019-02-22 NOTE — Progress Notes (Signed)
Paged Family Medicine concerning an order for a sitter, and if we can discontinue telemetry since she will not keep it on, awaiting orders or callback  Neta Mends RN 6:58 PM

## 2019-02-22 NOTE — ED Notes (Signed)
ED TO INPATIENT HANDOFF REPORT  ED Nurse Name and Phone #: [phone]  S Name/Age/Gender Hannah [name] 73 y.o. female Room/Bed: H019C/H019C  Code Status   Code Status: Full Code  Home/SNF/Other Home Patient oriented to: self, place and situation Is this baseline? Waxes & wanes, sundowns  Triage Complete: Triage complete  Chief Complaint SOB  Triage Note Pt endorses weakness X2 days. Pt with sudden onset of sob and chest tightness approx 45 mins pta. Pt lungs clear bilaterally with oxygen saturation 99% RA. BP 105/41. Pt c/o dizziness on arrival. 12 lead shows LBBB. Denies NVD. Pt with no chest pain on arrival.    Allergies No Known Allergies  Level of Care/Admitting Diagnosis ED Disposition    ED Disposition Condition [facility] Area: [place] [[id number]]  Level of Care: Telemetry Medical [104]  Covid Evaluation: Confirmed COVID Negative  Diagnosis: GI bleed [[id number]]  Admitting Physician: [name] [[id number]]  Attending Physician: MCDIARMID, TODD D [[id number]]  Estimated length of stay: past midnight tomorrow  Certification:: I certify this patient will need inpatient services for at least 2 midnights  PT Class (Do Not Modify): Inpatient [101]  PT Acc Code (Do Not Modify): Private [1]       B Medical/Surgery History Past Medical History:  Diagnosis Date  . Anemia   . Anemia of chronic disease   . Anxiety   . Arthritis    "hands" (05/09/2015)  . Chronic diastolic heart failure ([place])   . CKD (chronic kidney disease) stage 3, GFR 30-59 ml/min   . Dementia ([place])   . Elevated TSH 02/03/2018  . GERD (gastroesophageal reflux disease)   . HLD (hyperlipidemia)   . Hypertension   . LBBB (left bundle branch block)   . Mitral valve insufficiency   . Nonrheumatic mitral valve regurgitation 02/02/2019  . Osteoarthritis   . 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 S/P mitral valve repair    s/p MitraClip on 02/03/19  . Vitamin B12 deficiency  12/28/2018   Past Surgical History:  Procedure Laterality Date  . BIOPSY  10/28/2018   Procedure: BIOPSY;  Surgeon: Juanita Craver, MD;  Location: [facility] ENDOSCOPY;  Service: Endoscopy;;  . CARDIAC CATHETERIZATION  05/2004  . COLONOSCOPY  07/2002   Archie Endo 09/02/2010  . COLONOSCOPY WITH PROPOFOL N/A 10/28/2018   Procedure: COLONOSCOPY WITH PROPOFOL;  Surgeon: Juanita Craver, MD;  Location: [facility] ENDOSCOPY;  Service: Endoscopy;  Laterality: N/A;  . ESOPHAGOGASTRODUODENOSCOPY  07/2002   w/biopsies/notes 09/02/2010  . ESOPHAGOGASTRODUODENOSCOPY (EGD) WITH PROPOFOL N/A 10/28/2018   Procedure: ESOPHAGOGASTRODUODENOSCOPY (EGD) WITH PROPOFOL;  Surgeon: Juanita Craver, MD;  Location: [facility] ENDOSCOPY;  Service: Endoscopy;  Laterality: N/A;  . HOT HEMOSTASIS N/A 10/28/2018   Procedure: HOT HEMOSTASIS (ARGON PLASMA COAGULATION/BICAP);  Surgeon: Juanita Craver, MD;  Location: [facility] ENDOSCOPY;  Service: Endoscopy;  Laterality: N/A;  . IR ANGIOGRAM SELECTIVE EACH ADDITIONAL VESSEL  10/30/2018  . IR ANGIOGRAM SELECTIVE EACH ADDITIONAL VESSEL  10/30/2018  . IR ANGIOGRAM SELECTIVE EACH ADDITIONAL VESSEL  10/30/2018  . IR ANGIOGRAM SELECTIVE EACH ADDITIONAL VESSEL  10/30/2018  . IR ANGIOGRAM VISCERAL SELECTIVE  10/30/2018  . IR EMBO ART  VEN HEMORR LYMPH EXTRAV  INC GUIDE ROADMAPPING  10/30/2018  . IR US GUIDANCE  10/30/2018  . MITRAL VALVE REPAIR N/A 02/02/2019   Procedure: MITRAL VALVE REPAIR;  Surgeon: Sherren Mocha, MD;  Location: [place] CV LAB;  Service: Cardiovascular;  Laterality: N/A;  . POLYPECTOMY  10/28/2018   Procedure: POLYPECTOMY;  Surgeon: Juanita Craver,  MD;  Location: [place];  Service: Endoscopy;;  . RIGHT/LEFT HEART CATH AND CORONARY ANGIOGRAPHY N/A 12/29/2018   Procedure: RIGHT/LEFT HEART CATH AND CORONARY ANGIOGRAPHY;  Surgeon: Leonie Man, MD;  Location: [place] CV LAB;  Service: Cardiovascular;  Laterality: N/A;  . TEE WITHOUT CARDIOVERSION N/A 12/30/2018   Procedure: TRANSESOPHAGEAL ECHOCARDIOGRAM  (TEE);  Surgeon: Lelon Perla, MD;  Location: [facility] ENDOSCOPY;  Service: Cardiovascular;  Laterality: N/A;     A IV Location/Drains/Wounds Patient Lines/Drains/Airways Status   Active Line/Drains/Airways    Name:   Placement date:   Placement time:   Site:   Days:   Peripheral IV 02/22/19 Right Forearm   02/22/19    0755    Forearm   less than 1          Intake/Output Last 24 hours  Intake/Output Summary (Last 24 hours) at 02/22/2019 1445 Last data filed at 02/22/2019 1244 Gross per 24 hour  Intake 1700 ml  Output -  Net 1700 ml    Labs/Imaging Results for orders placed or performed during the hospital encounter of 02/21/19 (from the past 48 hour(s))  Basic metabolic panel     Status: Abnormal   Collection Time: 02/21/19  3:30 PM  Result Value Ref Range   Sodium 139 135 - 145 mmol/L   Potassium 3.5 3.5 - 5.1 mmol/L   Chloride 99 98 - 111 mmol/L   CO2 22 22 - 32 mmol/L   Glucose, Bld 119 (H) 70 - 99 mg/dL   BUN 46 (H) 8 - 23 mg/dL   Creatinine, Ser 2.08 (H) 0.44 - 1.00 mg/dL   Calcium 8.2 (L) 8.9 - 10.3 mg/dL   GFR calc non Af Amer 23 (L) >60 mL/min   GFR calc Af Amer 26 (L) >60 mL/min   Anion gap 18 (H) 5 - 15    Comment: Performed at [facility], [address] [place], [place] [address]  CBC with Differential     Status: Abnormal   Collection Time: 02/21/19  3:30 PM  Result Value Ref Range   WBC 5.5 4.0 - 10.5 K/uL   RBC 1.83 (L) 3.87 - 5.11 MIL/uL   Hemoglobin 5.7 (LL) 12.0 - 15.0 g/dL    Comment: REPEATED TO VERIFY THIS CRITICAL RESULT HAS VERIFIED AND BEEN CALLED TO M COFFEY,RN BY DENNIS BRADLEY ON 11 03 2020 AT 1559, AND HAS BEEN READ BACK.     HCT 18.5 (L) 36.0 - 46.0 %   MCV 101.1 (H) 80.0 - 100.0 fL   MCH 31.1 26.0 - 34.0 pg   MCHC 30.8 30.0 - 36.0 g/dL   RDW 15.9 (H) 11.5 - 15.5 %   Platelets 365 150 - 400 K/uL   nRBC 0.0 0.0 - 0.2 %   Neutrophils Relative % 64 %   Neutro Abs 3.4 1.7 - 7.7 K/uL   Lymphocytes Relative 26 %   Lymphs Abs 1.4  0.7 - 4.0 K/uL   Monocytes Relative 8 %   Monocytes Absolute 0.5 0.1 - 1.0 K/uL   Eosinophils Relative 2 %   Eosinophils Absolute 0.1 0.0 - 0.5 K/uL   Basophils Relative 0 %   Basophils Absolute 0.0 0.0 - 0.1 K/uL   Immature Granulocytes 0 %   Abs Immature Granulocytes 0.02 0.00 - 0.07 K/uL    Comment: Performed at [facility] [address], [place], [place] [address]  Brain natriuretic peptide     Status: Abnormal   Collection Time:  02/21/19  3:30 PM  Result Value Ref Range   B Natriuretic Peptide 258.0 (H) 0.0 - 100.0 pg/mL    Comment: Performed at [facility] [address] [place], [place] [address]  Troponin I (High Sensitivity)     Status: None   Collection Time: 02/21/19  3:30 PM  Result Value Ref Range   Troponin I (High Sensitivity) 14 <18 ng/L    Comment: (NOTE) Elevated high sensitivity troponin I (hsTnI) values and significant  changes across serial measurements may suggest ACS but many other  chronic and acute conditions are known to elevate hsTnI results.  Refer to the "Links" section for chest pain algorithms and additional  guidance. Performed at [facility], [place] [address] [place], [place] [address]   SARS CORONAVIRUS 2 (TAT 6-24 HRS) Nasopharyngeal Nasopharyngeal Swab     Status: None   Collection Time: 02/21/19  4:09 PM   Specimen: Nasopharyngeal Swab  Result Value Ref Range   SARS Coronavirus 2 NEGATIVE NEGATIVE    Comment: (NOTE) SARS-CoV-2 target nucleic acids are NOT DETECTED. The SARS-CoV-2 RNA is generally detectable in upper and lower respiratory specimens during the acute phase of infection. Negative results do not preclude SARS-CoV-2 infection, do not rule out co-infections with other pathogens, and should not be used as the sole basis for treatment or other patient management decisions. Negative results must be combined with clinical observations, patient history, and epidemiological information. The expected result is  Negative. Fact Sheet for Patients: SugarRoll.be Fact Sheet for Healthcare Providers: https://www.woods-mathews.com/ This test is not yet approved or cleared by the [place] FDA and  has been authorized for detection and/or diagnosis of SARS-CoV-2 by FDA under an Emergency Use Authorization (EUA). This EUA will remain  in effect (meaning this test can be used) for the duration of the COVID-19 declaration under Section 56 4(b)(1) of the Act, 21 U.S.C. section 360bbb-3(b)(1), unless the authorization is terminated or revoked sooner. Performed at [facility], [place] [address] [place], [place] [address]   Type and screen [place]     Status: None   Collection Time: 02/21/19  4:26 PM  Result Value Ref Range   ABO/RH(D) O NEG    Antibody Screen POS    Sample Expiration 02/24/2019,2359    Antibody Identification ANTI D    Unit Number [id number]    Blood Component Type RED CELLS,LR    Unit division 00    Status of Unit ISSUED,FINAL    Transfusion Status OK TO TRANSFUSE    Crossmatch Result COMPATIBLE    Unit Number [id number]    Blood Component Type RED CELLS,LR    Unit division 00    Status of Unit ISSUED,FINAL    Transfusion Status OK TO TRANSFUSE    Crossmatch Result COMPATIBLE   Prepare RBC     Status: None   Collection Time: 02/21/19  5:00 PM  Result Value Ref Range   Order Confirmation      ORDER PROCESSED BY BLOOD BANK Performed at [facility], [place] [address] [place], [place] [address]   POC occult blood, ED Provider will collect     Status: Abnormal   Collection Time: 02/21/19  5:02 PM  Result Value Ref Range   Fecal Occult Bld POSITIVE (A) NEGATIVE  Iron and TIBC     Status: Abnormal   Collection Time: 02/21/19  5:30 PM  Result Value Ref Range   Iron 21 (L) 28 -  170 ug/dL   TIBC 347 250 - 450 ug/dL   Saturation Ratios 6 (L) 10.4 - 31.8 %   UIBC 326 ug/dL    Comment: Performed at  [facility], [place] [address] [place], [place] [address]  Ferritin     Status: None   Collection Time: 02/21/19  5:30 PM  Result Value Ref Range   Ferritin 20 11 - 307 ng/mL    Comment: Performed at [facility], [place] [address], [place], [place] [address]  Troponin I (High Sensitivity)     Status: None   Collection Time: 02/21/19  6:03 PM  Result Value Ref Range   Troponin I (High Sensitivity) 16 <18 ng/L    Comment: (NOTE) Elevated high sensitivity troponin I (hsTnI) values and significant  changes across serial measurements may suggest ACS but many other  chronic and acute conditions are known to elevate hsTnI results.  Refer to the "Links" section for chest pain algorithms and additional  guidance. Performed at [facility], [place] [address] [place], [place] [address]   Basic metabolic panel     Status: Abnormal   Collection Time: 02/22/19  7:58 AM  Result Value Ref Range   Sodium 138 135 - 145 mmol/L   Potassium 3.1 (L) 3.5 - 5.1 mmol/L   Chloride 101 98 - 111 mmol/L   CO2 25 22 - 32 mmol/L   Glucose, Bld 104 (H) 70 - 99 mg/dL   BUN 43 (H) 8 - 23 mg/dL   Creatinine, Ser 1.83 (H) 0.44 - 1.00 mg/dL   Calcium 8.5 (L) 8.9 - 10.3 mg/dL   GFR calc non Af Amer 27 (L) >60 mL/min   GFR calc Af Amer 31 (L) >60 mL/min   Anion gap 12 5 - 15    Comment: Performed at [facility] [address] [place], [place] [address]  CBC with Differential/Platelet     Status: Abnormal   Collection Time: 02/22/19  7:58 AM  Result Value Ref Range   WBC 6.5 4.0 - 10.5 K/uL   RBC 2.73 (L) 3.87 - 5.11 MIL/uL   Hemoglobin 8.0 (L) 12.0 - 15.0 g/dL    Comment: REPEATED TO VERIFY POST TRANSFUSION SPECIMEN    HCT 24.8 (L) 36.0 - 46.0 %   MCV 90.8 80.0 - 100.0 fL    Comment: POST TRANSFUSION SPECIMEN REPEATED TO VERIFY    MCH 29.3 26.0 - 34.0 pg   MCHC 32.3 30.0 - 36.0 g/dL   RDW 19.8 (H) 11.5 - 15.5 %   Platelets 300 150 - 400 K/uL   nRBC 0.0 0.0 - 0.2 %   Neutrophils  Relative % 65 %   Neutro Abs 4.2 1.7 - 7.7 K/uL   Lymphocytes Relative 28 %   Lymphs Abs 1.8 0.7 - 4.0 K/uL   Monocytes Relative 7 %   Monocytes Absolute 0.5 0.1 - 1.0 K/uL   Eosinophils Relative 0 %   Eosinophils Absolute 0.0 0.0 - 0.5 K/uL   Basophils Relative 0 %   Basophils Absolute 0.0 0.0 - 0.1 K/uL   Immature Granulocytes 0 %   Abs Immature Granulocytes 0.02 0.00 - 0.07 K/uL    Comment: Performed at [facility] [address], [place], [place] [address]   Dg Chest 2 View  Result Date: 02/21/2019 CLINICAL DATA:  Shortness of breath and chest pain for a long time. History of gastroesophageal reflux disease, hypertension and chronic kidney disease. EXAM: CHEST -  2 VIEW COMPARISON:  Radiographs 01/30/2019. CT 11/06/2018. FINDINGS: The heart size and mediastinal contours are stable. There is mild aortic atherosclerosis. There are stable mild emphysematous changes without superimposed edema, confluent airspace opacity, pleural effusion or pneumothorax. The bones are intact. Telemetry leads overlie the chest. IMPRESSION: Stable chest. No active cardiopulmonary process. Electronically Signed   By: Richardean Sale M.D.   On: 02/21/2019 15:10    Pending Labs Unresulted Labs (From admission, onward)    Start     Ordered   02/21/19 1720  Hemoglobin and hematocrit, blood  Once,   STAT    Comments: Post transfusion   Question:  Specimen collection method  Answer:  IV Team=IV Team collect  Comment:  w piv start   02/21/19 1719   02/21/19 1659  Pathologist smear review  ONCE - STAT,   STAT     02/21/19 1700          Vitals/Pain Today's Vitals   02/22/19 1030 02/22/19 1302 02/22/19 1326 02/22/19 1443  BP:  (!) 127/54    Pulse:  97    Resp:  15    Temp:      TempSrc:      SpO2:  97%    PainSc: 0-No pain  Asleep Asleep    Isolation Precautions No active isolations  Medications Medications  atorvastatin (LIPITOR) tablet 40 mg (40 mg Oral Given 02/22/19 1030)  vitamin  B-12 (CYANOCOBALAMIN) tablet 1,000 mcg (1,000 mcg Oral Given 02/22/19 1030)  multivitamin with minerals tablet 1 tablet (1 tablet Oral Given 02/22/19 1029)  0.9 %  sodium chloride infusion ( Intravenous Stopped 02/22/19 1244)  pantoprazole (PROTONIX) injection 40 mg (40 mg Intravenous Given 02/22/19 1235)  ramelteon (ROZEREM) tablet 8 mg (has no administration in time range)  magnesium sulfate IVPB 2 g 50 mL (has no administration in time range)  0.9 %  sodium chloride infusion (0 mL/hr Intravenous Stopped 02/22/19 0252)  pantoprazole (PROTONIX) injection 40 mg (40 mg Intravenous Given 02/21/19 2048)  fentaNYL (SUBLIMAZE) injection 25 mcg (25 mcg Intravenous Given 02/22/19 0212)  LORazepam (ATIVAN) tablet 2 mg ( Oral See Alternative 02/22/19 0259)    Or  LORazepam (ATIVAN) injection 2 mg (2 mg Intramuscular Given 02/22/19 0259)  potassium chloride SA (KLOR-CON) CR tablet 40 mEq (40 mEq Oral Given 02/22/19 1234)    Mobility walks     Focused Assessments     R Recommendations: See Admitting Provider Note  Report given to:   Additional Notes:

## 2019-02-22 NOTE — ED Notes (Signed)
Pt self removed IV. RN informed.

## 2019-02-23 ENCOUNTER — Encounter (HOSPITAL_COMMUNITY)
Admission: EM | Disposition: A | Discharge status: Skilled Nursing Facility | Payer: Self-pay | Source: Home / Self Care | Attending: Family Medicine

## 2019-02-23 DIAGNOSIS — F0391 Unspecified dementia with behavioral disturbance: Secondary | ICD-10-CM

## 2019-02-23 DIAGNOSIS — D132 Benign neoplasm of duodenum: Secondary | ICD-10-CM

## 2019-02-23 DIAGNOSIS — I5020 Unspecified systolic (congestive) heart failure: Secondary | ICD-10-CM

## 2019-02-23 DIAGNOSIS — Z9889 Other specified postprocedural states: Secondary | ICD-10-CM

## 2019-02-23 HISTORY — PX: GIVENS CAPSULE STUDY: SHX5432

## 2019-02-23 LAB — CBC
HCT: 23.9 % — ABNORMAL LOW (ref 36.0–46.0)
HCT: 26.9 % — ABNORMAL LOW (ref 36.0–46.0)
Hemoglobin: 7.6 g/dL — ABNORMAL LOW (ref 12.0–15.0)
Hemoglobin: 8.7 g/dL — ABNORMAL LOW (ref 12.0–15.0)
MCH: 29.6 pg (ref 26.0–34.0)
MCH: 30 pg (ref 26.0–34.0)
MCHC: 31.8 g/dL (ref 30.0–36.0)
MCHC: 32.3 g/dL (ref 30.0–36.0)
MCV: 93 fL (ref 80.0–100.0)
MCV: 92.8 fL (ref 80.0–100.0)
Platelets: 271 10*3/uL (ref 150–400)
Platelets: 278 10*3/uL (ref 150–400)
RBC: 2.57 MIL/uL — ABNORMAL LOW (ref 3.87–5.11)
RBC: 2.9 MIL/uL — ABNORMAL LOW (ref 3.87–5.11)
RDW: 20 % — ABNORMAL HIGH (ref 11.5–15.5)
RDW: 19 % — ABNORMAL HIGH (ref 11.5–15.5)
WBC: 5.3 10*3/uL (ref 4.0–10.5)
WBC: 7.1 10*3/uL (ref 4.0–10.5)
nRBC: 0 % (ref 0.0–0.2)
nRBC: 0 % (ref 0.0–0.2)

## 2019-02-23 LAB — BASIC METABOLIC PANEL
Anion gap: 10 (ref 5–15)
BUN: 28 mg/dL — ABNORMAL HIGH (ref 8–23)
CO2: 25 mmol/L (ref 22–32)
Calcium: 8.4 mg/dL — ABNORMAL LOW (ref 8.9–10.3)
Chloride: 108 mmol/L (ref 98–111)
Creatinine, Ser: 1.39 mg/dL — ABNORMAL HIGH (ref 0.44–1.00)
GFR calc Af Amer: 43 mL/min — ABNORMAL LOW (ref >60)
GFR calc non Af Amer: 37 mL/min — ABNORMAL LOW (ref >60)
Glucose, Bld: 105 mg/dL — ABNORMAL HIGH (ref 70–99)
Potassium: 3.4 mmol/L — ABNORMAL LOW (ref 3.5–5.1)
Sodium: 143 mmol/L (ref 135–145)

## 2019-02-23 LAB — PREPARE RBC (CROSSMATCH)

## 2019-02-23 SURGERY — IMAGING PROCEDURE, GI TRACT, INTRALUMINAL, VIA CAPSULE
Anesthesia: LOCAL

## 2019-02-23 MED ORDER — ACETAMINOPHEN 325 MG PO TABS
650.0000 mg | ORAL_TABLET | Freq: Three times a day (TID) | ORAL | Status: DC | PRN
  Administered 2019-02-23 – 2019-02-26 (×5): 650 mg via ORAL
  Filled 2019-02-23 (×6): qty 2

## 2019-02-23 MED ORDER — SODIUM CHLORIDE 0.9% IV SOLUTION
Freq: Once | INTRAVENOUS | Status: AC
  Administered 2019-02-23: 15:00:00 via INTRAVENOUS

## 2019-02-23 SURGICAL SUPPLY — 1 items: TOWEL COTTON PACK 4EA (MISCELLANEOUS) ×4 IMPLANT

## 2019-02-23 NOTE — Progress Notes (Signed)
Subjective: No acute events.  Objective: Vital signs in last 24 hours: Temp:  [97.6 F (36.4 C)-98.7 F (37.1 C)] 98.5 F (36.9 C) (11/05 1153) Pulse Rate:  [90-114] 96 (11/05 1153) Resp:  [18-20] 20 (11/05 1153) BP: (103-115)/(44-73) 110/73 (11/05 1153) SpO2:  [97 %-99 %] 99 % (11/05 1153) Weight:  [76.3 kg] 76.3 kg (11/05 0300)    Intake/Output from previous day: 11/04 0701 - 11/05 0700 In: 3903 [P.O.:240; I.V.:1299.4; IV Piggyback:36.7] Out: -  Intake/Output this shift: Total I/O In: 0  Out: 200 [Urine:200]  Patient was in the restroom.  Lab Results: Recent Labs    02/21/19 1530 02/22/19 0758 02/23/19 0408  WBC 5.5 6.5 5.3  HGB 5.7* 8.0* 7.6*  HCT 18.5* 24.8* 23.9*  PLT 365 300 271   BMET Recent Labs    02/21/19 1530 02/22/19 0758 02/23/19 0408  NA 139 138 143  K 3.5 3.1* 3.4*  CL 99 101 108  CO2 22 25 25   GLUCOSE 119* 104* 105*  BUN 46* 43* 28*  CREATININE 2.08* 1.83* 1.39*  CALCIUM 8.2* 8.5* 8.4*   LFT No results for input(s): PROT, ALBUMIN, AST, ALT, ALKPHOS, BILITOT, BILIDIR, IBILI in the last 72 hours. PT/INR No results for input(s): LABPROT, INR in the last 72 hours. Hepatitis Panel No results for input(s): HEPBSAG, HCVAB, HEPAIGM, HEPBIGM in the last 72 hours. C-Diff No results for input(s): CDIFFTOX in the last 72 hours. Fecal Lactopherrin No results for input(s): FECLLACTOFRN in the last 72 hours.  Studies/Results: Dg Chest 2 View  Result Date: 02/21/2019 CLINICAL DATA:  Shortness of breath and chest pain for a long time. History of gastroesophageal reflux disease, hypertension and chronic kidney disease. EXAM: CHEST - 2 VIEW COMPARISON:  Radiographs 01/30/2019. CT 11/06/2018. FINDINGS: The heart size and mediastinal contours are stable. There is mild aortic atherosclerosis. There are stable mild emphysematous changes without superimposed edema, confluent airspace opacity, pleural effusion or pneumothorax. The bones are intact. Telemetry  leads overlie the chest. IMPRESSION: Stable chest. No active cardiopulmonary process. Electronically Signed   By: Richardean Sale M.D.   On: 02/21/2019 15:10    Medications:  Scheduled: . sodium chloride   Intravenous Once  . atorvastatin  40 mg Oral Daily  . multivitamin with minerals  1 tablet Oral Daily  . pantoprazole (PROTONIX) IV  40 mg Intravenous Q12H  . ramelteon  8 mg Oral QHS  . cyanocobalamin  1,000 mcg Oral Daily   Continuous:   Assessment/Plan: 1) IDA. 2) Heme positive stool.   The patient is undergoing the VCE.  It was not able to performed yesterday as there were no VCE recorder packs available.  The results will be read tomorrow afternoon.  She will be made NPO after midnight in case there is a positive finding with the VCE that requires intervention with an upper endoscopy.  LOS: 2 days   Lanique Gonzalo D 02/23/2019, 1:50 PM

## 2019-02-23 NOTE — Progress Notes (Signed)
Called to patient's room by nurse; patient had removed belt for PillCam recorder and placed it on her bedside table.  Recorder read video complete; not known how long belt was off before nurse found it.  Brought back to endo and downloaded.

## 2019-02-23 NOTE — Progress Notes (Signed)
Tele-monitor order was place.  When connecting camera for tele-monitor, nurse notice that the belt  Place this morning for pill cam recorder was next to the patient.     Patient does not remember taking it off.     Pt stating having a headache. Verbal tylenol order place.

## 2019-02-23 NOTE — Progress Notes (Addendum)
Family Medicine Teaching Service Daily Progress Note Intern Pager: [phone]  [name] Medical record number: [id number] Date of birth: 1945-05-09 Age: 73 y.o. Gender: female  Primary Care Provider: [name], MD Consultants: GI  Code Status: Full Code   Pt Overview and Major Events to Date:  Hospital Day: 3 02/21/2019: admitted for Shortness of Breath   Assessment and Plan: Hannah [name] a 73 y.o.femalepresenting with 1 month of worsening fatigue. PMH is significant forAnemia of chronic disease, Anxiety, HFrEF, CKD stage 3, GFR 30-59 ml/min, Dementia, GERD, HLD, Hypertension, LBBB, Osteoarthritis, S/P mitral valve repair, and Vitamin B12 deficiency.  Symptomatic anemia likely secondary to GI bleed Patient on asa and plavix s/p MVR on 02/02/19. Hemoglobin 5.7 on admit > s/p 2u RBC (02/21/19) > 8.0 (11/4) and back down to 7.6 today. Previous hx of GIB (august 2020).  Will transfuse 2 units today.  Gastroenterology on board, appreciate recommendations.  Patient currently doing capsule study.  Patient denies being dizzy today but does report that she has not been up yet.  She does not have any shortness of breath nor is she experiencing racing heart.  Patient is currently getting maintenance fluids at 50 cc an hour for low blood pressures yesterday.  Her blood pressures have slowly improved and are in the 100s over 50s today.  Will monitor patient's fluid status.  Patient advised to let us know if she is experiencing any shortness of breath. Will d/c plavix per cardiologist and hold on ASA for a few more days.   Transfusion threshold > 7  Transfuse 2 units today, hold maintenance fluids  Continue IV Protonix  Continue n.p.o.  Continue holding ASA and Plavix  PT OT when hemodynamically stable  Appreciate GI recs, possible u/l endoscopy if necessary  Hypotension, h/o HTN Patient's blood pressures are improved since yesterday  Holding home  Lasix, losartan,and metoprolol  Hold maintenance fluids given transfusion  Monitor blood pressure  Hypokalemia 3.4 today. Patient is currently NPO.   Will recheck in the morning as well as Magnesium  Prolonged QT Avoid medications that prolong QTc 546 on 11/4. Previously consistently in the low 500's.   EKG   HFrEFstatus post recent mitral valve replacement 01/2019: Most recent echo with EF of 30 to 35%.  She is status post mitral valve replacement.  Given blood transfusion, will continue to monitor fluid status and breathing status today.  Will provide Lasix if needed.  Holding fluids as above.  Holding losartan, metoprolol, Lasix given hypotension, restart as able  Strict I's and O's, Daily weights  Monitor fluid status closely given IVF  AKIonCKD stage IIIb: Recent creatinine baseline appears to be around 1.3-1.4. Patient's creatinine is 1.39 and stable.  Trend BMP / urinary output  Replace electrolytes as indicated  Avoid nephrotoxic agents, ensure adequate renal perfusion  Holding losartan, lasix  Anemia of chronic disease: Hemoglobin baseline between 8-9. Patient on iron supplements at home.  Anemia panel  Transfusion threshold>7.  Daily CBC  Holding iron supplements  HLD: Patient on home atorvastatin 40 mg  Continue home atorvastatin 40 mg  Insomnia On trazadone at home. Holding for sundowning.   Ramelteon QHS  Dementia: Patient poor historian. Please speak to daughter regarding history and ongoing care.  Call daughter with updates today   FEN/GI:NPO, IV protonix BID Prophylaxis:SCDs  Disposition: pending GI work up  Subjective:  NAEO.   Objective: Temp:  [98.1 F (36.7 C)-98.7 F (37.1 C)] 98.1 F (36.7 C) (11/05  0448) Pulse Rate:  [89-114] 90 (11/05 0448) Cardiac Rhythm: Normal sinus rhythm;Bundle branch block (11/04 1539) Resp:  [13-20] 18 (11/05 0448) BP: (92-127)/(37-54) 105/44 (11/05 0448) SpO2:  [97 %-98 %]  97 % (11/04 2336) Weight:  [76.3 kg] 76.3 kg (11/05 0300) Intake/Output      11/04 0701 - 11/05 0700   P.O. 240   I.V. (mL/kg) 1299.4 (17)   IV Piggyback 36.7   Total Intake(mL/kg) 1576 (20.7)   Net +1576       Urine Occurrence 2 x       Physical Exam: General: NAD, non-toxic, well-appearing, sitting comfortably in bed with monitor around abdomen    HEENT: [place]/AT. PERRLA. EOMI.  Cardiovascular: RRR, normal S1, S2. B/L 2+ RP. No BLEE Respiratory: CTAB. No IWOB.  Abdomen: + BS. NT, ND, soft to palpation.  Extremities: Warm and well perfused. Moving spontaneously.  Integumentary: No obvious rashes, lesions, trauma on general exam.  Laboratory: I have personally read and reviewed all labs and imaging studies.  CBC: Recent Labs  Lab 02/21/19 1530 02/22/19 0758 02/23/19 0408  WBC 5.5 6.5 5.3  NEUTROABS 3.4 4.2  --   HGB 5.7* 8.0* 7.6*  HCT 18.5* 24.8* 23.9*  MCV 101.1* 90.8 93.0  PLT 365 300 271   CMP: Recent Labs  Lab 02/21/19 1530 02/22/19 0758 02/23/19 0408  NA 139 138 143  K 3.5 3.1* 3.4*  CL 99 101 108  CO2 22 25 25   GLUCOSE 119* 104* 105*  BUN 46* 43* 28*  CREATININE 2.08* 1.83* 1.39*  CALCIUM 8.2* 8.5* 8.4*   CBG: No results for input(s): GLUCAP in the last 168 hours. Micro: Covid Negative    Imaging/Diagnostic Tests: Dg Chest 2 View  Result Date: 02/21/2019 CLINICAL DATA:  Shortness of breath and chest pain for a long time. History of gastroesophageal reflux disease, hypertension and chronic kidney disease. EXAM: CHEST - 2 VIEW COMPARISON:  Radiographs 01/30/2019. CT 11/06/2018. FINDINGS: The heart size and mediastinal contours are stable. There is mild aortic atherosclerosis. There are stable mild emphysematous changes without superimposed edema, confluent airspace opacity, pleural effusion or pneumothorax. The bones are intact. Telemetry leads overlie the chest. IMPRESSION: Stable chest. No active cardiopulmonary process. Electronically Signed   By:  Richardean Sale M.D.   On: 02/21/2019 15:10    EKG Interpretation  Date/Time:  Tuesday February 21 2019 14:23:22 EST Ventricular Rate:  91 PR Interval:    QRS Duration: 140 QT Interval:  443 QTC Calculation: 546 R Axis:   42 Text Interpretation: Sinus rhythm Atrial premature complex Left bundle branch block Confirmed by Virgel Manifold [phone]) on 02/21/2019 3:07:43 PM       Wilber Oliphant, MD 02/23/2019, 6:26 AM PGY-2, [place] Intern pager: [phone], text pages welcome

## 2019-02-23 NOTE — Progress Notes (Signed)
Called to daughter for daily update as requested. Answered questions. No further concerns to be addressed. Daughter does report that she does want the patient to be diagnosed prior to discharge as she was having so many symptoms prior to admission.   Wilber Oliphant, M.D.  4:02 PM 02/23/2019

## 2019-02-23 NOTE — Evaluation (Signed)
Occupational Therapy Evaluation and Discharge Patient Details Name: Hannah [name] MRN: [id number] DOB: 1945-06-20 Today's Date: 02/23/2019    History of Present Illness SHARDAE [name] is a 73 y.o. female presenting with 1 month of worsening fatigue. PMH is significant for  Anemia of chronic disease, Anxiety, HFrEF, CKD stage 3, GFR 30-59 ml/min, Dementia, GERD, HLD, Hypertension, LBBB, Osteoarthritis, S/P mitral valve repair, and Vitamin B12 deficiency. Admitted with fatigue/weakness hemoglobin 5.7 now s/p 2 units PRBC and work up for GI bleed.   Clinical Impression   Pt is functioning at a supervision level. She pleasantly confused. Disoriented to situation and time and repeats questions and statement frequently. Decreased problem solving, pt with HA but did not notify RN. Likely near her baseline. No further OT needs.    Follow Up Recommendations  No OT follow up    Equipment Recommendations  None recommended by OT    Recommendations for Other Services       Precautions / Restrictions        Mobility Bed Mobility Overal bed mobility: Independent                Transfers   Equipment used: None Transfers: Sit to/from Stand Sit to Stand: Supervision              Balance Overall balance assessment: No apparent balance deficits (not formally assessed)                                         ADL either performed or assessed with clinical judgement   ADL                                         General ADL Comments: Overall functioning at a supervision level for safety.     Vision Patient Visual Report: No change from baseline       Perception     Praxis      Pertinent Vitals/Pain Pain Assessment: Faces Faces Pain Scale: Hurts whole lot Pain Location: head Pain Descriptors / Indicators: Aching Pain Intervention(s): Patient requesting pain meds-RN notified     Hand Dominance Right   Extremity/Trunk  Assessment Upper Extremity Assessment Upper Extremity Assessment: Overall WFL for tasks assessed   Lower Extremity Assessment Lower Extremity Assessment: Defer to PT evaluation   Cervical / Trunk Assessment Cervical / Trunk Assessment: Kyphotic   Communication Communication Communication: No difficulties   Cognition Arousal/Alertness: Awake/alert Behavior During Therapy: WFL for tasks assessed/performed Overall Cognitive Status: History of cognitive impairments - at baseline                                 General Comments: history of dementia   General Comments       Exercises     Shoulder Instructions      Home Living Family/patient expects to be discharged to:: Private residence Living Arrangements: Children(daughter [name]) Available Help at Discharge: Family;Available 24 hours/day(daughter works from home) Type of Home: House Home Access: Ramped entrance;Stairs to enter [employer] of Steps: 5 Entrance Stairs-Rails: None Home Layout: One level     Bathroom Shower/Tub: Occupational psychologist: Handicapped height     Home Equipment: Environmental consultant - 2 wheels;Cane - quad;Wheelchair -  manual          Prior Functioning/Environment Level of Independence: Needs assistance  Gait / Transfers Assistance Needed: independent PTA ADL's / Homemaking Assistance Needed: daughter assists with medications and some IADL            OT Problem List:        OT Treatment/Interventions:      OT Goals(Current goals can be found in the care plan section) Acute Rehab OT Goals Patient Stated Goal: to go home  OT Frequency:     Barriers to D/C:            Co-evaluation              AM-PAC OT "6 Clicks" Daily Activity     Outcome Measure Help from another person eating meals?: None Help from another person taking care of personal grooming?: None Help from another person toileting, which includes using toliet, bedpan, or urinal?:  None Help from another person bathing (including washing, rinsing, drying)?: None Help from another person to put on and taking off regular upper body clothing?: None Help from another person to put on and taking off regular lower body clothing?: None 6 Click Score: 24   End of Session    Activity Tolerance: Patient tolerated treatment well Patient left: in bed;with call bell/phone within reach  OT Visit Diagnosis: Other symptoms and signs involving cognitive function                Time: 0511-0211 OT Time Calculation (min): 13 min Charges:  OT General Charges $OT Visit: 1 Visit OT Evaluation $OT Eval Moderate Complexity: 1 Mod  Nestor Lewandowsky, OTR/L Acute Rehabilitation Services Pager: [phone] Office: [phone]  Malka So 02/23/2019, 1:05 PM

## 2019-02-24 ENCOUNTER — Encounter (HOSPITAL_COMMUNITY)
Admission: EM | Disposition: A | Discharge status: Skilled Nursing Facility | Payer: Self-pay | Source: Home / Self Care | Attending: Family Medicine

## 2019-02-24 ENCOUNTER — Inpatient Hospital Stay (HOSPITAL_COMMUNITY): Payer: Medicare Other | Admitting: Anesthesiology

## 2019-02-24 ENCOUNTER — Encounter (HOSPITAL_COMMUNITY): Payer: Self-pay | Admitting: Gastroenterology

## 2019-02-24 HISTORY — PX: ENTEROSCOPY: SHX5533

## 2019-02-24 HISTORY — PX: HOT HEMOSTASIS: SHX5433

## 2019-02-24 LAB — BASIC METABOLIC PANEL
Anion gap: 8 (ref 5–15)
BUN: 22 mg/dL (ref 8–23)
CO2: 24 mmol/L (ref 22–32)
Calcium: 8.4 mg/dL — ABNORMAL LOW (ref 8.9–10.3)
Chloride: 108 mmol/L (ref 98–111)
Creatinine, Ser: 1.32 mg/dL — ABNORMAL HIGH (ref 0.44–1.00)
GFR calc Af Amer: 46 mL/min — ABNORMAL LOW (ref >60)
GFR calc non Af Amer: 39 mL/min — ABNORMAL LOW (ref >60)
Glucose, Bld: 109 mg/dL — ABNORMAL HIGH (ref 70–99)
Potassium: 3.5 mmol/L (ref 3.5–5.1)
Sodium: 140 mmol/L (ref 135–145)

## 2019-02-24 LAB — CBC
HCT: 32.7 % — ABNORMAL LOW (ref 36.0–46.0)
Hemoglobin: 10.4 g/dL — ABNORMAL LOW (ref 12.0–15.0)
MCH: 29.4 pg (ref 26.0–34.0)
MCHC: 31.8 g/dL (ref 30.0–36.0)
MCV: 92.4 fL (ref 80.0–100.0)
Platelets: 267 10*3/uL (ref 150–400)
RBC: 3.54 MIL/uL — ABNORMAL LOW (ref 3.87–5.11)
RDW: 18.8 % — ABNORMAL HIGH (ref 11.5–15.5)
WBC: 9.1 10*3/uL (ref 4.0–10.5)
nRBC: 0 % (ref 0.0–0.2)

## 2019-02-24 LAB — MAGNESIUM: Magnesium: 2.2 mg/dL (ref 1.7–2.4)

## 2019-02-24 SURGERY — ENTEROSCOPY
Anesthesia: Monitor Anesthesia Care

## 2019-02-24 MED ORDER — GLUCAGON HCL RDNA (DIAGNOSTIC) 1 MG IJ SOLR
INTRAMUSCULAR | Status: DC | PRN
  Administered 2019-02-24: .5 mg via INTRAVENOUS

## 2019-02-24 MED ORDER — LORAZEPAM 2 MG/ML IJ SOLN
2.0000 mg | Freq: Once | INTRAMUSCULAR | Status: AC
  Administered 2019-02-24: 2 mg via INTRAVENOUS
  Filled 2019-02-24: qty 1

## 2019-02-24 MED ORDER — LACTATED RINGERS IV SOLN
INTRAVENOUS | Status: DC
  Administered 2019-02-24: 15:00:00 via INTRAVENOUS

## 2019-02-24 MED ORDER — GLUCAGON HCL RDNA (DIAGNOSTIC) 1 MG IJ SOLR
INTRAMUSCULAR | Status: AC
  Filled 2019-02-24: qty 1

## 2019-02-24 MED ORDER — PROPOFOL 500 MG/50ML IV EMUL
INTRAVENOUS | Status: DC | PRN
  Administered 2019-02-24: 150 ug/kg/min via INTRAVENOUS
  Administered 2019-02-24: 17:00:00 via INTRAVENOUS

## 2019-02-24 MED ORDER — SODIUM CHLORIDE 0.9 % IV SOLN: INTRAVENOUS | Status: DC

## 2019-02-24 MED ORDER — FUROSEMIDE 10 MG/ML IJ SOLN
60.0000 mg | Freq: Every day | INTRAMUSCULAR | Status: DC
  Administered 2019-02-24: 60 mg via INTRAVENOUS
  Filled 2019-02-24: qty 6

## 2019-02-24 NOTE — Interval H&P Note (Signed)
History and Physical Interval Note:  02/24/2019 4:02 PM  Hannah [name]  has presented today for surgery, with the diagnosis of Bleeding AVMs..  The various methods of treatment have been discussed with the patient and family. After consideration of risks, benefits and other options for treatment, the patient has consented to  Procedure(s): ENTEROSCOPY (N/A) as a surgical intervention.  The patient's history has been reviewed, patient examined, no change in status, stable for surgery.  I have reviewed the patient's chart and labs.  Questions were answered to the patient's satisfaction.     Aimi Essner D

## 2019-02-24 NOTE — Transfer of Care (Signed)
Immediate Anesthesia Transfer of Care Note  Patient: Hannah [name]  Procedure(s) Performed: ENTEROSCOPY (N/A ) HOT HEMOSTASIS (ARGON PLASMA COAGULATION/BICAP) (N/A )  Patient Location: Endoscopy Unit  Anesthesia Type:MAC  Level of Consciousness: responds to stimulation  Airway & Oxygen Therapy: Patient Spontanous Breathing and Patient connected to nasal cannula oxygen  Post-op Assessment: Report given to RN and Post -op Vital signs reviewed and stable  Post vital signs: Reviewed and stable  Last Vitals:  Vitals Value Taken Time  BP    Temp    Pulse    Resp    SpO2      Last Pain:  Vitals:   02/24/19 1448  TempSrc:   PainSc: 0-No pain      Patients Stated Pain Goal: 0 (54/36/06 7703)  Complications: No apparent anesthesia complications

## 2019-02-24 NOTE — Anesthesia Preprocedure Evaluation (Addendum)
Anesthesia Evaluation  Patient identified by MRN, date of birth, ID band Patient confused    Reviewed: Allergy & Precautions, NPO status , Patient's Chart, lab work & pertinent test results  Airway Mallampati: III  TM Distance: >3 FB Neck ROM: Full    Dental no notable dental hx.    Pulmonary former smoker,    Pulmonary exam normal breath sounds clear to auscultation       Cardiovascular hypertension, Pt. on medications and Pt. on home beta blockers +CHF  + Valvular Problems/Murmurs MR  Rhythm:Regular Rate:Tachycardia  ECG: rate 82. Normal sinus rhythm Non-specific intra-ventricular conduction block  ECHO: 1. Left ventricular ejection fraction, by visual estimation, is 30 to 35%. The left ventricle has moderately decreased function. Left ventricular septal wall thickness was normal. There is mildly increased left ventricular hypertrophy.  2. Left ventricular diastolic function could not be evaluated pattern of LV diastolic filling.  3. Global right ventricle has normal systolic function.The right ventricular size is normal. No increase in right ventricular wall thickness.  4. Left atrial size was severely dilated.  5. Right atrial size was mildly dilated.  6. Trivial pericardial effusion is present.  7. The mitral valve has been repaired/replaced. Mild mitral valve regurgitation. Moderate mitral stenosis.  8. The tricuspid valve is normal in structure. Tricuspid valve regurgitation is mild.  9. The aortic valve is normal in structure. Aortic valve regurgitation was not visualized by color flow Doppler. 10. The pulmonic valve was not assessed. Pulmonic valve regurgitation was not assessed by color flow Doppler. 11. Aortic root could not be assessed. 12. Day 1 post MitraClip placement. Mitral regurgitation is mild. Transmitral gradients are elevated at 7 mmHg. LVEF decreased from prior the procedure from 50-55% to 30-35% with  paradoxical septal motion. No pericardial effusion. 13. Iatrogenic left to right shunting.   Neuro/Psych PSYCHIATRIC DISORDERS Anxiety Dementia negative neurological ROS     GI/Hepatic Neg liver ROS, GERD  Medicated and Controlled,  Endo/Other  negative endocrine ROS  Renal/GU CRFRenal disease     Musculoskeletal negative musculoskeletal ROS (+)   Abdominal (+) + obese,   Peds  Hematology  (+) anemia , HLD   Anesthesia Other Findings Bleeding AVMs.  Reproductive/Obstetrics                            Anesthesia Physical Anesthesia Plan  ASA: III  Anesthesia Plan: MAC   Post-op Pain Management:    Induction: Intravenous  PONV Risk Score and Plan: 2 and Propofol infusion and Treatment may vary due to age or medical condition  Airway Management Planned: Nasal Cannula  Additional Equipment:   Intra-op Plan:   Post-operative Plan:   Informed Consent: I have reviewed the patients History and Physical, chart, labs and discussed the procedure including the risks, benefits and alternatives for the proposed anesthesia with the patient or authorized representative who has indicated his/her understanding and acceptance.     Dental advisory given and Consent reviewed with POA  Plan Discussed with: CRNA  Anesthesia Plan Comments: (Anesthetic plan discussed with daughter via telephone. )        Anesthesia Quick Evaluation

## 2019-02-24 NOTE — Consult Note (Signed)
   [facility] CM Inpatient Consult   02/24/2019  Hannah [name] 1945/09/24 [id number]    Patient screened for 30 day readmission and potential need of [facility] care management services as a benefit from her [place] with 48% extreme high risk score for unplanned readmission, 7 hospitalizations, 6 ED visits in the past 6 months.  Patient had prior outreaches from [place] coordinator for care coordination/ transition of care and [place] for medication adherence.  Review of medical record shows that patient: Hannah [name] a 73 y.o.femalepresenting with 1 month of worsening fatigue. PMH is significant forAnemia of chronic disease, Anxiety,HFrEF, CKD stage 3, GFR 30-59 ml/min, Dementia, GERD, HLD, Hypertension, LBBB, Osteoarthritis, S/P mitral valve repair, and Vitamin B12 deficiency. [Symptomatic anemia likely secondary to GI bleed, hypotension]  Her primary care provider is Dr. Talbert Cage with [facility], listed to provide transition of care.  Review of PT notes show that current recommendation is for skilled nursing facility due to concern of safety re: cognition, risk of falls and weakness.  Will follow for disposition and needs as appropriate. If there are any [facility] Care Management needs identified at thispointfor post hospital follow-up, please refer.   For questions and referral, please contact:  Tu Shimmel A. Shajuan Musso, BSN, RN-BC [facility] Liaison Cell: [phone]

## 2019-02-24 NOTE — Care Management Important Message (Signed)
Important Message  Patient Details  Name: Hannah [name] MRN: [id number] Date of Birth: 1945-07-30   Medicare Important Message Given:        [name] 02/24/2019, 3:04 PM

## 2019-02-24 NOTE — Progress Notes (Signed)
Family Medicine Teaching Service Daily Progress Note Intern Pager: [phone]  [name] Medical record number: [id number] Date of birth: 1946/02/08 Age: 73 y.o. Gender: female  Primary Care Provider: [name], MD Consultants: GI  Code Status: Full Code   Pt Overview and Major Events to Date:  Hospital Day: 4 02/21/2019: admitted for Shortness of Breath 11/5: Received 2U pRBCs, total of 4U pRBCs transfused  Assessment and Plan: Hannah Spadafora Robertsis a 73 y.o.femalepresenting with 1 month of worsening fatigue. PMH is significant forAnemia of chronic disease, Anxiety, HFrEF, CKD stage 3, GFR 30-59 ml/min, Dementia, GERD, HLD, Hypertension, LBBB, Osteoarthritis, S/P mitral valve repair, and Vitamin B12 deficiency.  Symptomatic anemia likely secondary to GI bleed Patient on asa and plavix s/p MVR on 02/02/19. Hemoglobin 5.7 on admit and received 2U pRBCs for a total of 4U pRBCs transfused. Hgb 10.4 this a.m. Previous hx of GIB (august 2020). Patient denies being dizzy today but does report that she has not been up yet.  She does not have any shortness of breath nor is she experiencing racing heart.  Her blood pressures have slowly improved and are in the 100s over 50s today.    Transfusion threshold > 7  Continue IV Protonix, Continue n.p.o.  Discontinue Plavix, continue to hold ASA a few more days per cardiology  PT/OT eval and treat  Appreciate GI recs, possible u/l endoscopy if necessary> s/p capsule study  Hypotension, h/o HTN Patient's blood pressures are improved since yesterday  Holding home Lasix, losartan,and metoprolol  Hold maintenance fluids given transfusion  Monitor blood pressure  Prolonged QT Avoid medications that prolong QTc 546 on 11/4. Previously consistently in the low 500's.   EKG   HFrEFstatus post recent mitral valve replacement 01/2019: Most recent echo with EF of 30 to 35%.  She is status post mitral valve  replacement.  Given blood transfusion, will continue to monitor fluid status and breathing status today.  Will provide Lasix if needed.  Holding fluids as above.  Holding losartan, metoprolol, Lasix given hypotension, restart as able  Strict I's and O's, Daily weights  Monitor fluid status closely given IVF and transfusions> give IV furosemide today given recent blood transfusions  AKI (resolved)onCKD stage IIIb: Recent creatinine baseline appears to be around 1.3-1.4. Patient's creatinine is 1.32 and stable. Trend BMP / urinary output Replace electrolytes as indicated Avoid nephrotoxic agents, ensure adequate renal perfusion  Holding losartan, lasix  Anemia of chronic disease: Hemoglobin baseline between 8-9. Patient on iron supplements at home.  Anemia panel  Transfusion threshold>7.  Daily CBC  Holding iron supplements  HLD: Patient on home atorvastatin 40 mg  Continue home atorvastatin 40 mg  Insomnia On trazadone at home. Holding for sundowning.   Ramelteon QHS  Dementia: Patient poor historian. Please speak to daughter regarding history and ongoing care.  Call daughter with updates today   FEN/GI:NPO, IV protonix BID Prophylaxis:SCDs  Disposition: pending GI work up  Subjective:  Sitting on side of bed with no complaints, on room air  Objective: Temp:  [97.5 F (36.4 C)-98.5 F (36.9 C)] 97.5 F (36.4 C) (11/06 0402) Pulse Rate:  [78-107] 97 (11/06 0402) Cardiac Rhythm: Sinus tachycardia;Bundle branch block (11/06 0700) Resp:  [16-21] 20 (11/06 0402) BP: (97-142)/(45-97) 118/57 (11/06 0402) SpO2:  [87 %-99 %] 92 % (11/06 0402) Weight:  [77.3 kg] 77.3 kg (11/06 0156) Intake/Output      11/05 0701 - 11/06 0700 11/06 0701 - 11/07 0700   P.O.  1119    I.V. (mL/kg)     Blood 654    IV Piggyback     Total Intake(mL/kg) 1773 (22.9)    Urine (mL/kg/hr) 900 (0.5)    Stool 1    Total Output 901    Net +872         Urine Occurrence 1 x         Physical Exam: General: NAD, pleasant Cardiovascular: RRR, no m/r/g, no LE edema Respiratory: CTA BL, normal work of breathing Gastrointestinal: soft, nontender, nondistended, normoactive BS MSK: moves 4 extremities equally Derm: no rashes appreciated Neuro: CN II-XII grossly intact Psych: AO, appropriate affect  Laboratory: I have personally read and reviewed all labs and imaging studies.  CBC: Recent Labs  Lab 02/21/19 1530 02/22/19 0758 02/23/19 0408 02/23/19 2251 02/24/19 0431  WBC 5.5 6.5 5.3 7.1 9.1  NEUTROABS 3.4 4.2  --   --   --   HGB 5.7* 8.0* 7.6* 8.7* 10.4*  HCT 18.5* 24.8* 23.9* 26.9* 32.7*  MCV 101.1* 90.8 93.0 92.8 92.4  PLT 365 300 271 278 267   CMP: Recent Labs  Lab 02/22/19 0758 02/23/19 0408 02/24/19 0431  NA 138 143 140  K 3.1* 3.4* 3.5  CL 101 108 108  CO2 25 25 24   GLUCOSE 104* 105* 109*  BUN 43* 28* 22  CREATININE 1.83* 1.39* 1.32*  CALCIUM 8.5* 8.4* 8.4*   CBG: No results for input(s): GLUCAP in the last 168 hours. Micro: Covid Negative    Imaging/Diagnostic Tests: No results found.  EKG Interpretation  Date/Time:  Tuesday February 21 2019 14:23:22 EST Ventricular Rate:  91 PR Interval:    QRS Duration: 140 QT Interval:  443 QTC Calculation: 546 R Axis:   42 Text Interpretation: Sinus rhythm Atrial premature complex Left bundle branch block Confirmed by Virgel Manifold [phone]) on 02/21/2019 3:07:43 PM       Hannah [name], [place], DO 02/24/2019, 8:18 AM PGY-3, [place] Intern pager: [phone], text pages welcome

## 2019-02-24 NOTE — Progress Notes (Signed)
Physical Therapy Treatment Patient Details Name: Hannah [name] MRN: [id number] DOB: 31-Mar-1946 Today's Date: 02/24/2019    History of Present Illness Hannah [name] is a 73 y.o. female presenting with 1 month of worsening fatigue. PMH is significant for  Anemia of chronic disease, Anxiety, HFrEF, CKD stage 3, GFR 30-59 ml/min, Dementia, GERD, HLD, Hypertension, LBBB, Osteoarthritis, S/P mitral valve repair, and Vitamin B12 deficiency. Admitted with fatigue/weakness hemoglobin 5.7 now s/p 2 units PRBC and work up for GI bleed.    PT Comments    Patient reports not sleeping well last night, lethargic upon arrival. Requires Min guard for transfers and min A for short distance ambulation to/from bathroom. Pt with flexed trunk and leaning on RW with reported LE weakness; "my legs are weak as water." Pt noted to have poor safety awareness as pt pushing RW to the side before entering bathroom and grabbing onto counter, door etc. Limited by fatigue and weakness. Discharge recommendation updated to SNF due to above. Concerned about safety re: cognition, risk of falls and weakness. Will follow.    Follow Up Recommendations  SNF;Supervision for mobility/OOB     Equipment Recommendations  None recommended by PT    Recommendations for Other Services       Precautions / Restrictions Precautions Precautions: Fall Restrictions Weight Bearing Restrictions: No    Mobility  Bed Mobility Overal bed mobility: Needs Assistance Bed Mobility: Sit to Supine       Sit to supine: Supervision;HOB elevated   General bed mobility comments: Sitting EOB upon PT arrival. Able to return to supine with supervision for safety as pt turning to sit prematurely onto bed.  Transfers Overall transfer level: Needs assistance Equipment used: Rolling walker (2 wheeled) Transfers: Sit to/from Stand Sit to Stand: Min guard         General transfer comment: Min guard for safety. pt with exaggerated flexed  trunk, not able to get upright, leaning on RW. Stood from [employer], from toilet x1.  Ambulation/Gait Ambulation/Gait assistance: Min assist Gait Distance (Feet): 20 Feet(x2 bouts) Assistive device: Rolling walker (2 wheeled) Gait Pattern/deviations: Step-through pattern;Decreased stride length Gait velocity: decreased Gait velocity interpretation: <1.8 ft/sec, indicate of risk for recurrent falls General Gait Details: Slow, unsteady gait with flexed trunk/hips with RW too far anterior, pushing RW to the side and reaching for counter/doorway to get into bathroom. Min A for balance. Cues for upright and RW proximity and to keep it with her.   Stairs             Wheelchair Mobility    Modified Rankin (Stroke Patients Only)       Balance Overall balance assessment: Needs assistance Sitting-balance support: Feet supported;No upper extremity supported Sitting balance-Leahy Scale: Fair     Standing balance support: During functional activity Standing balance-Leahy Scale: Fair Standing balance comment: Standing at the sink to wash hands- leaning on counter for support, close Min guard, "my legs feel as weak as water"                            Cognition Arousal/Alertness: Awake/alert Behavior During Therapy: WFL for tasks assessed/performed Overall Cognitive Status: History of cognitive impairments - at baseline                                 General Comments: history of dementia; oriented to place and self. Poor  safety awareness. Came in as pt attempting to get OOB to go to bathroom. When got pt up, "where are we going?"      Exercises      General Comments General comments (skin integrity, edema, etc.): No dizziness reported throughout.      Pertinent Vitals/Pain Pain Assessment: Faces Faces Pain Scale: No hurt(however grimacing entire session, "just really tired")    Home Living                      Prior Function             PT Goals (current goals can now be found in the care plan section) Progress towards PT goals: Not progressing toward goals - comment(due to weakness and fatigue)    Frequency    Min 3X/week      PT Plan Discharge plan needs to be updated    Co-evaluation              AM-PAC PT "6 Clicks" Mobility   Outcome Measure  Help needed turning from your back to your side while in a flat bed without using bedrails?: None Help needed moving from lying on your back to sitting on the side of a flat bed without using bedrails?: None Help needed moving to and from a bed to a chair (including a wheelchair)?: A Little Help needed standing up from a chair using your arms (e.g., wheelchair or bedside chair)?: A Little Help needed to walk in hospital room?: A Little Help needed climbing 3-5 steps with a railing? : A Little 6 Click Score: 20    End of Session Equipment Utilized During Treatment: Gait belt Activity Tolerance: Patient limited by fatigue Patient left: in bed;with call bell/phone within reach;with bed alarm set Nurse Communication: Mobility status PT Visit Diagnosis: Other abnormalities of gait and mobility (R26.89);Muscle weakness (generalized) (M62.81)     Time: 1610-9604 PT Time Calculation (min) (ACUTE ONLY): 13 min  Charges:  $Therapeutic Activity: 8-22 mins                     [name], PT, DPT Acute Rehabilitation Services Pager [phone] Office [phone]       [name] 02/24/2019, 11:15 AM

## 2019-02-24 NOTE — Anesthesia Postprocedure Evaluation (Signed)
Anesthesia Post Note  Patient: Hannah [name]  Procedure(s) Performed: ENTEROSCOPY (N/A ) HOT HEMOSTASIS (ARGON PLASMA COAGULATION/BICAP) (N/A )     Patient location during evaluation: Endoscopy Anesthesia Type: MAC Level of consciousness: awake Pain management: pain level controlled Vital Signs Assessment: post-procedure vital signs reviewed and stable Respiratory status: spontaneous breathing, nonlabored ventilation, respiratory function stable and patient connected to nasal cannula oxygen Cardiovascular status: stable and blood pressure returned to baseline Postop Assessment: no apparent nausea or vomiting Anesthetic complications: no    Last Vitals:  Vitals:   02/24/19 1718 02/24/19 1737  BP: (!) 122/58 (!) 103/52  Pulse: 94 73  Resp: (!) 22 16  Temp:  36.4 C  SpO2: 97% 95%    Last Pain:  Vitals:   02/24/19 1737  TempSrc: Oral  PainSc:                  Jennings Corado P Darrious Youman

## 2019-02-24 NOTE — Progress Notes (Signed)
Brief procedure note.  The VCE was positive for multiple proximal small bowel AVMs.  There was no active bleeding, but she will undergo an enteroscope with APC.

## 2019-02-24 NOTE — Op Note (Signed)
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 Estimated Blood Loss:     Estimated blood loss was minimal. Procedure:                Pre-Anesthesia Assessment:                           - Prior to the procedure, a History and Physical                            was performed, and patient medications and                            allergies were reviewed. The patient's tolerance of                            previous anesthesia was also reviewed. The risks                            and benefits of the procedure and the sedation                            options and risks were discussed with the patient.                            All questions were answered, and informed consent                            was obtained. Prior Anticoagulants: The patient has                            taken no previous anticoagulant or antiplatelet                            agents. ASA Grade Assessment: III - A patient with                            severe systemic disease. After reviewing the risks  and benefits, the patient was deemed in                            satisfactory condition to undergo the procedure.                           - Sedation was administered by an anesthesia                            professional. Deep sedation was attained.                           After obtaining informed consent, the endoscope was       passed under direct vision. Throughout the                            procedure, the patient's blood pressure, pulse, and                            oxygen saturations were monitored continuously. The                            [id number]-[id number] ([id number] Olympus pediatric colonscope                            was introduced through the mouth and advanced to                            the small bowel distal to the Ligament of Treitz.                            The small bowel enteroscopy was accomplished                            without difficulty. The patient tolerated the                            procedure well. Scope In: Scope Out: Findings:      The esophagus was normal.      The stomach was normal.      The examined duodenum was normal.      Multiple angiodysplastic lesions with stigmata of recent bleeding were       found in the proximal jejunum. Coagulation for hemostasis using       monopolar probe was successful. Estimated blood loss was minimal.      Multiple jejunal AVMs were identified, but none were noted to be       actively bleeding. However, in two segments of bowel she did exhibit       fresh blood and small clots. The exact site was not found. Impression:               - Normal esophagus.                           - Normal stomach.                           -  Normal examined duodenum.                           - Multiple recently bleeding angiodysplastic                            lesions in the jejunum. Treated with a monopolar                            probe.                           - No specimens collected. Recommendation:           - Patient has a contact number available for                            emergencies. The signs and symptoms of potential                            delayed complications were discussed with the                            patient. Return to normal activities tomorrow.                            Written discharge instructions were  provided to the                            patient.                           - Resume regular diet.                           - Follow HGB and transfuse if necessary.                           - If her HGB is stable she can be discharged home                            and follow up with Dr. Collene Mares in 1-2 weeks. Procedure Code(s):        --- Professional ---                           [phone], Small intestinal endoscopy, enteroscopy                            beyond second portion of duodenum, not including                            ileum; with control of bleeding (eg, injection,                            bipolar cautery, unipolar cautery, laser, heater  probe, stapler, plasma coagulator) Diagnosis Code(s):        --- Professional ---                           K55.21, Angiodysplasia of colon with hemorrhage                           K31.819, Angiodysplasia of stomach and duodenum                            without bleeding CPT copyright 2019 American Medical Association. All rights reserved. The codes documented in this report are preliminary and upon coder review may  be revised to meet current compliance requirements. Carol Ada, MD Carol Ada, MD 02/24/2019 5:10:31 PM This report has been signed electronically. Number of Addenda: 0

## 2019-02-24 NOTE — H&P (View-Only) (Signed)
Brief procedure note.  The VCE was positive for multiple proximal small bowel AVMs.  There was no active bleeding, but she will undergo an enteroscope with APC.

## 2019-02-25 DIAGNOSIS — T457X5S Adverse effect of anticoagulant antagonists, vitamin K and other coagulants, sequela: Secondary | ICD-10-CM

## 2019-02-25 LAB — TYPE AND SCREEN
ABO/RH(D): O NEG
Antibody Screen: POSITIVE
Unit division: 0
Unit division: 0
Unit division: 0
Unit division: 0

## 2019-02-25 LAB — BPAM RBC
Blood Product Expiration Date: 202011122359
Blood Product Expiration Date: 202011122359
Blood Product Expiration Date: 202011232359
Blood Product Expiration Date: 202011242359
ISSUE DATE / TIME: 202011031826
ISSUE DATE / TIME: 202011032246
ISSUE DATE / TIME: 202011051525
ISSUE DATE / TIME: 202011060124
Unit Type and Rh: 9500
Unit Type and Rh: 9500
Unit Type and Rh: 9500
Unit Type and Rh: 9500

## 2019-02-25 LAB — CBC
HCT: 32.5 % — ABNORMAL LOW (ref 36.0–46.0)
Hemoglobin: 10.4 g/dL — ABNORMAL LOW (ref 12.0–15.0)
MCH: 29.2 pg (ref 26.0–34.0)
MCHC: 32 g/dL (ref 30.0–36.0)
MCV: 91.3 fL (ref 80.0–100.0)
Platelets: 269 10*3/uL (ref 150–400)
RBC: 3.56 MIL/uL — ABNORMAL LOW (ref 3.87–5.11)
RDW: 19 % — ABNORMAL HIGH (ref 11.5–15.5)
WBC: 6.2 10*3/uL (ref 4.0–10.5)
nRBC: 0 % (ref 0.0–0.2)

## 2019-02-25 LAB — BASIC METABOLIC PANEL
Anion gap: 11 (ref 5–15)
BUN: 19 mg/dL (ref 8–23)
CO2: 26 mmol/L (ref 22–32)
Calcium: 8.6 mg/dL — ABNORMAL LOW (ref 8.9–10.3)
Chloride: 106 mmol/L (ref 98–111)
Creatinine, Ser: 1.34 mg/dL — ABNORMAL HIGH (ref 0.44–1.00)
GFR calc Af Amer: 45 mL/min — ABNORMAL LOW (ref >60)
GFR calc non Af Amer: 39 mL/min — ABNORMAL LOW (ref >60)
Glucose, Bld: 98 mg/dL (ref 70–99)
Potassium: 3.2 mmol/L — ABNORMAL LOW (ref 3.5–5.1)
Sodium: 143 mmol/L (ref 135–145)

## 2019-02-25 MED ORDER — FERROUS SULFATE 325 (65 FE) MG PO TABS
325.0000 mg | ORAL_TABLET | Freq: Every day | ORAL | Status: DC
  Administered 2019-02-26 – 2019-02-28 (×3): 325 mg via ORAL
  Filled 2019-02-25 (×3): qty 1

## 2019-02-25 MED ORDER — PANTOPRAZOLE SODIUM 40 MG PO TBEC
40.0000 mg | DELAYED_RELEASE_TABLET | Freq: Every day | ORAL | Status: DC
  Administered 2019-02-25 – 2019-02-28 (×4): 40 mg via ORAL
  Filled 2019-02-25 (×4): qty 1

## 2019-02-25 MED ORDER — FUROSEMIDE 40 MG PO TABS
60.0000 mg | ORAL_TABLET | Freq: Two times a day (BID) | ORAL | Status: DC
  Administered 2019-02-25 – 2019-02-26 (×3): 60 mg via ORAL
  Filled 2019-02-25 (×3): qty 1

## 2019-02-25 MED ORDER — POTASSIUM CHLORIDE CRYS ER 20 MEQ PO TBCR
40.0000 meq | EXTENDED_RELEASE_TABLET | Freq: Once | ORAL | Status: AC
  Administered 2019-02-25: 40 meq via ORAL
  Filled 2019-02-25: qty 2

## 2019-02-25 MED ORDER — METOPROLOL SUCCINATE ER 25 MG PO TB24
25.0000 mg | ORAL_TABLET | Freq: Every day | ORAL | Status: DC
  Administered 2019-02-26 – 2019-02-28 (×3): 25 mg via ORAL
  Filled 2019-02-25 (×3): qty 1

## 2019-02-25 MED ORDER — ASPIRIN 81 MG PO CHEW
81.0000 mg | CHEWABLE_TABLET | Freq: Every day | ORAL | Status: DC
  Administered 2019-02-26 – 2019-02-28 (×3): 81 mg via ORAL
  Filled 2019-02-25 (×3): qty 1

## 2019-02-25 NOTE — Progress Notes (Addendum)
Family Medicine Teaching Service Daily Progress Note Intern Pager: [phone]  [name] Medical record number: [id number] Date of birth: January 11, 1946 Age: 73 y.o. Gender: female  Primary Care Provider: [name], MD Consultants: GI Code Status: Full Code   Pt Overview and Major Events to Date:  02/21/2019: admitted for Shortness of Breath 02/23/19: Received 2U pRBCs, total of 4U pRBCs transfused 02/24/19: Enteroscope with APC  Assessment and Plan: Hannah Affinito Robertsis a 73 y.o.femalepresenting with 1 month of worsening fatigue. PMH is significant forAnemia of chronic disease, Anxiety,HFrEF, CKD stage 3, GFR 30-59 ml/min, Dementia, GERD, HLD, Hypertension, LBBB, Osteoarthritis, S/P mitral valve repair, and Vitamin B12 deficiency.  Symptomatic anemia likely secondary to GI bleed Anemia of chronic disease Patient on asa and plavix s/p MVR on 02/02/19. Hemoglobin 5.7 on admit and received 2U pRBCs for a total of 4U pRBCs transfused. Hgb 10.4 this a.m. Previous hx of GIB in August 2020. HR 94 with BP 106/47. BL Hgb 8-9 with home iron supplementation. Enteroscopy was performed on 11/6 showing normal esophagus, normal stomach, normal duodenum.  Multiple angiodysplastic lesions with stigmata of recent bleeding were found in the proximal jejunum.  Coagulation was successful.  Was recommended for outpatient follow-up with GI.  Patient today reports feeling well and denies any symptoms of dizziness, she was able to ambulate to restroom without any difficulty. - Transfusion threshold > 7 - Transition to PO protonix daily - Discontinue Plavix, continue to hold ASA. Will re-start ASA on 11/8 - GI consulted, appreciate recommendations: Advised to resume regular diet, follow hemoglobin and transfuse if necessary, if hemoglobin is stable she can be discharged home with follow-up with Dr. Collene Mares in 1 to 2 weeks. - holding home iron   Hypotension, h/o HTN Slightly hypotensive  this AM with BP 106/47.  - Holding home losartan,and metoprolol, restart as tolerated  - restart home lasix 60mg  bid  Hypokalemia K 3.2 - replete PRN  Prolonged QTc QTc 504 on 11/5. Previously consistently in the low 500's.  - avoid QTC prolonging drugs   HFrEFstatus post recent mitral valve replacement 01/2019: Most recent echo with EF of 30 to 35% on 01/2019.  She is status post mitral valve replacement.  Given blood transfusion, will continue to monitor fluid status and breathing status today.  Holding fluids as above. - Holding losartan, metoprololgiven hypotension, restart as able - restart home lasix - Strict I's and O's,Daily weights - Monitor fluid status closelygiven IVF and transfusions   CKD stage IIIb: Cr 1.34, BL ~1.3-1.4. Patient's creatinine is 1.32 and stable. - Trend BMP / urinary output - avoid nephrotoxic drugs  HLD: Home meds: atorvastatin 40 mg - continue home meds  Insomnia Home meds: trazodone - hold trazodone in the setting of sun downing  - ramelteon qhs  Dementia: Patient poor historian.  - Call daughter with updates today   FEN/GI:hearth healthy, protonix  Prophylaxis:SCDs  Disposition: Awaiting SNF placement  Subjective:  Patient today reports she feels overall well.  Denies any chest pain, shortness of breath.  States that she has not had any dizziness.  States that she was able to ambulate to the restroom overnight without any difficulty.  States that she is very appreciative of the care she is receiving here at the hospital.  Objective: Temp:  [97.6 F (36.4 C)-99.3 F (37.4 C)] 99.1 F (37.3 C) (11/07 0515) Pulse Rate:  [73-112] 94 (11/07 0515) Resp:  [16-36] 16 (11/07 0515) BP: (103-140)/(40-65) 106/47 (11/07 0515) SpO2:  [  93 %-98 %] 98 % (11/07 0515) Weight:  [74.4 kg] 74.4 kg (11/07 0515) Physical Exam: General: Awake and alert, watching television in room, smiling, no apparent distress Cardiovascular: Regular  rate and rhythm, no murmurs rubs or gallops Respiratory: Clear to auscultation bilaterally, no wheezes rales or rhonchi, speaking full sentences Abdomen: Soft, nontender, nondistended Extremities: No edema, able to move both lower extremities spontaneously  Laboratory: Recent Labs  Lab 02/23/19 2251 02/24/19 0431 02/25/19 0345  WBC 7.1 9.1 6.2  HGB 8.7* 10.4* 10.4*  HCT 26.9* 32.7* 32.5*  PLT 278 267 269   Recent Labs  Lab 02/23/19 0408 02/24/19 0431 02/25/19 0345  NA 143 140 143  K 3.4* 3.5 3.2*  CL 108 108 106  CO2 25 24 26   BUN 28* 22 19  CREATININE 1.39* 1.32* 1.34*  CALCIUM 8.4* 8.4* 8.6*  GLUCOSE 105* 109* 98     Imaging/Diagnostic Tests: No new imaging   Caroline More, DO 02/25/2019, 6:59 AM PGY-3, [place] Intern pager: [phone], text pages welcome

## 2019-02-25 NOTE — Progress Notes (Signed)
Updated daughter, [name] patient status.  No questions or concerns voiced.  Carollee Leitz MD

## 2019-02-25 NOTE — NC FL2 (Addendum)
[place] LEVEL OF CARE SCREENING TOOL     IDENTIFICATION  [name] Birthdate: 1945/06/21 Sex: female Admission Date (Current Location): 02/21/2019  [facility] and [place] Number:  Herbalist and Address:  [facility] [place], [place] [address] [place], [place] [address]      Provider Number: [id number]  Attending Physician Name and Address:  Martyn Malay, MD  Relative Name and Phone Number:  [name] [phone]    Current Level of Care: Hospital Recommended Level of Care: [place] Prior Approval Number:    Date Approved/Denied:   PASRR Number: [id number]   Discharge Plan: SNF    Current Diagnoses: Patient Active Problem List   Diagnosis Date Noted  . Adverse effect of antiplatelet agent 02/22/2019  . GI bleed 02/21/2019  . Hypokalemia 02/03/2019  . S/P mitral valve repair   . CKD (chronic kidney disease) stage 3, GFR 30-59 ml/min   . Anemia of chronic disease   . Vitamin B12 deficiency 12/28/2018  . Acute on chronic combined systolic and diastolic CHF (congestive heart failure) ([place])   . AKI (acute kidney injury) ([place])   . Gastric bleed   . 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 Gout 09/11/2015  . Symptomatic anemia 05/08/2015  . Essential hypertension 08/09/2008  . GERD 08/09/2008    Orientation RESPIRATION BLADDER Height & Weight     Self  Normal Continent Weight: 164 lb (74.4 kg) Height:  5\' 2"  (157.5 cm)  BEHAVIORAL SYMPTOMS/MOOD NEUROLOGICAL BOWEL NUTRITION STATUS      Continent Diet(Cardiac)  AMBULATORY STATUS COMMUNICATION OF NEEDS Skin   Limited Assist Verbally Skin abrasions(MASD, Dry)                       Personal Care Assistance Level of Assistance  Bathing, Feeding, Dressing, Total care Bathing Assistance: Limited assistance Feeding assistance: Independent Dressing Assistance: Limited assistance Total Care  Assistance: Limited assistance   Functional Limitations Info  Sight, Hearing, Speech Sight Info: Adequate Hearing Info: Adequate Speech Info: Adequate    SPECIAL CARE FACTORS FREQUENCY  PT (By licensed PT), OT (By licensed OT)     PT Frequency: 5x per week OT Frequency: 5x per week            Contractures Contractures Info: Not present    Additional Factors Info  Code Status, Allergies Code Status Info: Full Allergies Info: NKA           Current Medications (02/25/2019):  This is the current hospital active medication list Current Facility-Administered Medications  Medication Dose Route Frequency Provider Last Rate Last Dose  . acetaminophen (TYLENOL) tablet 650 mg  650 mg Oral Q8H PRN Martyn Malay, MD   650 mg at 02/25/19 1255  . atorvastatin (LIPITOR) tablet 40 mg  40 mg Oral Daily Bonnita Hollow, MD   40 mg at 02/25/19 1034  . furosemide (LASIX) tablet 60 mg  60 mg Oral BID Caroline More, DO   60 mg at 02/25/19 7893  . [START ON 02/26/2019] metoprolol succinate (TOPROL-XL) 24 hr tablet 25 mg  25 mg Oral Daily Milus Banister C, DO      . multivitamin with minerals tablet 1 tablet  1 tablet Oral Daily Kinnie Feil, MD   1 tablet at 02/25/19 1034  . pantoprazole (PROTONIX) EC tablet 40 mg  40 mg Oral Daily Abraham, Sherin, DO   40 mg  at 02/25/19 1034  . ramelteon (ROZEREM) tablet 8 mg  8 mg Oral QHS Enid Derry, [place], DO   8 mg at 02/24/19 2118  . vitamin B-12 (CYANOCOBALAMIN) tablet 1,000 mcg  1,000 mcg Oral Daily Bonnita Hollow, MD   1,000 mcg at 02/25/19 1034     Discharge Medications: Please see discharge summary for a list of discharge medications.  Relevant Imaging Results:  Relevant Lab Results:   Additional Information ss# [place], [place]

## 2019-02-25 NOTE — Progress Notes (Addendum)
Family Medicine Teaching Service Daily Progress Note Intern Pager: [phone]  [name] Medical record number: [id number] Date of birth: 1945/12/30 Age: 73 y.o. Gender: female  Primary Care Provider: [name], MD Consultants: GI Code Status: Full Code   Pt Overview and Major Events to Date:  02/21/2019: admitted for Shortness of Breath 02/23/19: Received 2U pRBCs, total of 4U pRBCs transfused 02/24/19: Enteroscope with multiple angiodysplastic lesions in proximal jejunum s/p APC 02/25/19: Medically stable for discharge to SNF  Assessment and Plan: Willo Yoon Robertsis a 73 y.o.femalepresenting with 1 month of worsening fatigue. PMH is significant forAnemia of chronic disease, Anxiety,HFrEF, CKD stage 3, GFR 30-59 ml/min, Dementia, GERD, HLD, Hypertension, LBBB, Osteoarthritis, S/P mitral valve repair, and Vitamin B12 deficiency.  Symptomatic anemia likely 2/2 to GI bleed  Anemia of chronic disease  H/o GI Bleed (11/2018) S/p 4U pRBC transfusion. Hgb 10.4>12.1 this AM (baseline Hgb 8-9 on home iron supplementation). VSS overnight. No further signs of bleeding. Home ASA and Plavix (s/p MCR on 10/15) have been held since admission. Will plan to restart ASA today. Discontinue Plavix altogether. Patient is medically stable for discharge to SNF. - GI consulted, appreciate recs: resume regular diet, monitor Hgb and transfuse if necessary, discharge home if hgb stable and follow up with Dr. Collene Mares in 1-2 weeks - Transfusion threshold <7 - Plavix discontinued - Restart home ASA today - Restart iron supplement - continue PO protonix QD  Hypotension  H/o HTN BP soft but normotensive overnight. Home meds include: Losartan 25mg  QD, Metoprolol 25mg  QD, Lasix 60mg  BID - Continue home Metoprolol and Lasix - Continue to hold home Losartan given softer BP's, restart as tolerated  Hypokalemia  Hypomagnesemia  K 3.2>3.4 this AM, Mag 1.5  - Mag oxide 400mg  x 1,  Kdur 65mEq x 1 - monitor daily labs - replete PRN  Prolonged QTc QTc 504 on 11/5. Corrected QTc is 468. Previously consistently in the low 500's.  - avoid QTC prolonging drugs   HFrEFs/p Mitral Valve Repair 01/2019: Most recent echo (01/2019) with EF of 30 to 35%.  She is status post mitral valve repair. Home meds include: Metoprolol 25mg  QD, Lasix 60mg  BID. UOP 1.3L overnight. Wt downtrending from 77.3>74.4. - Continue home Metoprolol 25mg  QD and Lasix 60mg  BID - Strict I&O's,Daily weights - Monitor fluid status closelygiven IVF and transfusions   CKD stage IIIb: Cr 1.34>1.50, BL ~1.3-1.4.  - daily BMP, trend urinary output - avoid nephrotoxic drugs - repeat BMP at 1300, consider holding PM lasix dose due to bump in creatinine  HLD: Home meds: atorvastatin 40 mg - continue home meds  Insomnia Home meds: trazodone - hold trazodone in the setting of sun downing  - ramelteon qhs  Dementia: Patient poor historian.  - Call daughter with updates today   FEN/GI:hearth healthy, protonix  Prophylaxis:SCDs  Disposition: Awaiting SNF placement  Subjective:  No acute events overnight. Patient is feeling really well this AM. Denies any chest pain or SOB. States she is ambulating well without any dizziness. Ambulated to restroom without difficulty.  Objective: Temp:  [98 F (36.7 C)-98.4 F (36.9 C)] 98.4 F (36.9 C) (11/07 2023) Pulse Rate:  [84-92] 84 (11/07 2023) Resp:  [18] 18 (11/07 2023) BP: (114-117)/(57-69) 114/57 (11/07 2023) SpO2:  [97 %-98 %] 97 % (11/07 2023) Physical Exam: General: very pleasant older lady, well nourished, well developed, in no acute distress with non-toxic appearance, sleeping comfortably upon entering  CV: regular rate and rhythm without murmurs,  rubs, or gallops, no lower extremity edema Lungs: clear to auscultation bilaterally with normal work of breathing Abdomen: soft, non-tender, non-distended, normoactive bowel sounds Skin:  warm, dry Extremities: warm and well perfused MSK: gait slow but without difficulty Neuro: pleasantly demented  Laboratory: Recent Labs  Lab 02/24/19 0431 02/25/19 0345 02/26/19 0422  WBC 9.1 6.2 5.9  HGB 10.4* 10.4* 12.1  HCT 32.7* 32.5* 37.4  PLT 267 269 265   Recent Labs  Lab 02/24/19 0431 02/25/19 0345 02/26/19 0422  NA 140 143 141  K 3.5 3.2* 3.4*  CL 108 106 102  CO2 24 26 27   BUN 22 19 25*  CREATININE 1.32* 1.34* 1.50*  CALCIUM 8.4* 8.6* 8.9  GLUCOSE 109* 98 114*     Imaging/Diagnostic Tests: No new imaging   Danna Hefty, DO 02/26/2019, 5:39 AM PGY-2, [place] Intern pager: [phone], text pages welcome

## 2019-02-25 NOTE — TOC Initial Note (Signed)
Transition of Care [facility]) - Initial/Assessment Note    Patient Details  Name: Hannah [name] MRN: [id number] Date of Birth: [birth year]  Transition [facility] Care [facility]) CM/SW Contact:    Bary Castilla, LCSW Phone Number: [phone] 02/25/2019, 2:35 PM  Clinical Narrative:                  CSW received a return call for patient's daughter and CSW discussed the SNF recommendation. [name] was aware of recommendation and in agreement. CSW spoke to [place] about the process and directed her to review the SNF rating on Medicare. gov which [name] was familiar. CSW and [name] agreed that CSW would call her back tomorrow after she was able to look at the ratings and check facility that take patient's insurance which [name] said she could access.  CSW will continue to follow for discharge planning needs.  Expected Discharge Plan: Skilled Nursing Facility Barriers to Discharge: Continued Medical Work up, Ship broker, SNF Pending bed offer   Patient Goals and CMS Choice Patient states their goals for this hospitalization and ongoing recovery are:: To get stronger CMS Medicare.gov Compare Post Acute Care list provided to:: Patient Represenative (must comment)(Daughter)    Expected Discharge Plan and Services Expected Discharge Plan: [place]       Living arrangements for the past 2 months: Single Family Home                                      Prior Living Arrangements/Services Living arrangements for the past 2 months: Single Family Home Lives with:: Self, Adult Children Patient language and need for interpreter reviewed:: Yes Do you feel safe going back to the place where you live?: Yes      Need for Family Participation in Patient Care: Yes (Comment) Care giver support system in place?: Yes (comment)   Criminal Activity/Legal Involvement Pertinent to Current Situation/Hospitalization: No - Comment as needed  Activities  of Daily Living Home Assistive Devices/Equipment: None ADL Screening (condition at time of admission) Patient's cognitive ability adequate to safely complete daily activities?: No Is the patient deaf or have difficulty hearing?: No Does the patient have difficulty seeing, even when wearing glasses/contacts?: No Does the patient have difficulty concentrating, remembering, or making decisions?: Yes Patient able to express need for assistance with ADLs?: Yes Does the patient have difficulty dressing or bathing?: No Independently performs ADLs?: Yes (appropriate for developmental age) Does the patient have difficulty walking or climbing stairs?: Yes Weakness of Legs: Both Weakness of Arms/Hands: None  Permission Sought/Granted   Permission granted to share information with : Yes, Verbal Permission Granted  Share Information with NAME: Daughter  Permission granted to share info w AGENCY: SNFs  Permission granted to share info w Relationship: [name]  Permission granted to share info w Contact Information: [phone]  Emotional Assessment   Attitude/Demeanor/Rapport: Unable to Assess Affect (typically observed): Unable to Assess Orientation: : Oriented to Self      Admission diagnosis:  AKI (acute kidney injury) ([place]) [N17.9] Symptomatic anemia [D64.9] GI bleed [K92.2] Patient Active Problem List   Diagnosis Date Noted  . Adverse effect of antiplatelet agent 02/22/2019  . GI bleed 02/21/2019  . Hypokalemia 02/03/2019  . S/P mitral valve repair   . CKD (chronic kidney disease) stage 3, GFR 30-59 ml/min   . Anemia of chronic disease   .  Vitamin B12 deficiency 12/28/2018  . Acute on chronic combined systolic and diastolic CHF (congestive heart failure) ([place])   . AKI (acute kidney injury) ([place])   . Gastric bleed   . 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 Gout 09/11/2015  . Symptomatic anemia 05/08/2015  . Essential hypertension  08/09/2008  . GERD 08/09/2008   PCP:  [name], MD Pharmacy:   [id number] - [place], [place] [address] Phone: [phone] Fax: [phone]     Social Determinants of Health (SDOH) Interventions    Readmission Risk Interventions Readmission Risk Prevention Plan 02/03/2019  Transportation Screening Complete  Medication Review ([place]) Complete  PCP or Specialist appointment within 3-5 days of discharge Complete  HRI or [place] Complete  SW Recovery Care/Counseling Consult Complete  [place] Not Applicable  Some recent data might be hidden

## 2019-02-25 NOTE — Progress Notes (Signed)
CSW acknowledges consult and attempted to reach out to patient's daughter in regard to SNF and had to leave a message. CSW will continue to follow for disposition plan.

## 2019-02-26 ENCOUNTER — Encounter (HOSPITAL_COMMUNITY): Payer: Self-pay | Admitting: Gastroenterology

## 2019-02-26 DIAGNOSIS — F0280 Dementia in other diseases classified elsewhere without behavioral disturbance: Secondary | ICD-10-CM

## 2019-02-26 DIAGNOSIS — G309 Alzheimer's disease, unspecified: Secondary | ICD-10-CM

## 2019-02-26 LAB — BASIC METABOLIC PANEL
Anion gap: 12 (ref 5–15)
BUN: 25 mg/dL — ABNORMAL HIGH (ref 8–23)
CO2: 27 mmol/L (ref 22–32)
Calcium: 8.9 mg/dL (ref 8.9–10.3)
Chloride: 102 mmol/L (ref 98–111)
Creatinine, Ser: 1.5 mg/dL — ABNORMAL HIGH (ref 0.44–1.00)
GFR calc Af Amer: 39 mL/min — ABNORMAL LOW (ref >60)
GFR calc non Af Amer: 34 mL/min — ABNORMAL LOW (ref >60)
Glucose, Bld: 114 mg/dL — ABNORMAL HIGH (ref 70–99)
Potassium: 3.4 mmol/L — ABNORMAL LOW (ref 3.5–5.1)
Sodium: 141 mmol/L (ref 135–145)

## 2019-02-26 LAB — CBC
HCT: 37.4 % (ref 36.0–46.0)
Hemoglobin: 12.1 g/dL (ref 12.0–15.0)
MCH: 29.8 pg (ref 26.0–34.0)
MCHC: 32.4 g/dL (ref 30.0–36.0)
MCV: 92.1 fL (ref 80.0–100.0)
Platelets: 265 10*3/uL (ref 150–400)
RBC: 4.06 MIL/uL (ref 3.87–5.11)
RDW: 17.9 % — ABNORMAL HIGH (ref 11.5–15.5)
WBC: 5.9 10*3/uL (ref 4.0–10.5)
nRBC: 0 % (ref 0.0–0.2)

## 2019-02-26 LAB — MAGNESIUM: Magnesium: 1.5 mg/dL — ABNORMAL LOW (ref 1.7–2.4)

## 2019-02-26 LAB — SARS CORONAVIRUS 2 (TAT 6-24 HRS): SARS Coronavirus 2: NEGATIVE

## 2019-02-26 MED ORDER — POTASSIUM CHLORIDE CRYS ER 20 MEQ PO TBCR
40.0000 meq | EXTENDED_RELEASE_TABLET | Freq: Once | ORAL | Status: AC
  Administered 2019-02-26: 40 meq via ORAL
  Filled 2019-02-26: qty 2

## 2019-02-26 MED ORDER — MAGNESIUM OXIDE 400 (241.3 MG) MG PO TABS
400.0000 mg | ORAL_TABLET | Freq: Every day | ORAL | Status: AC
  Administered 2019-02-26: 400 mg via ORAL
  Filled 2019-02-26: qty 1

## 2019-02-26 MED ORDER — FUROSEMIDE 40 MG PO TABS: 60.0000 mg | ORAL_TABLET | Freq: Every day | ORAL | Status: DC

## 2019-02-26 NOTE — Progress Notes (Signed)
RN spoke with Dr. Tarry Kos via phone, concerning pt losing IV access tonight.  RN obtained verbal order to leave IV out from physician. No IV meds ordered during this time.

## 2019-02-26 NOTE — TOC Progression Note (Signed)
Transition of Care [facility]) - Progression Note    Patient Details  Name: Hannah [name] MRN: [id number] Date of Birth: [birth year]  Transition of Care [facility]) CM/SW [place], [place] Phone Number: [phone] 02/26/2019, 4:11 PM  Clinical Narrative:     CSW spoke to patient's daughter and she agreed that referrals could be faxed out to facilities in [place] and [place].  TOC will continue to follow for discharge planning needs.   Expected Discharge Plan: [place] Barriers to Discharge: Continued Medical Work up, Ship broker, SNF Pending bed offer  Expected Discharge Plan and Services Expected Discharge Plan: [place] arrangements for the past 2 months: Single Family Home                                       Social Determinants of Health (SDOH) Interventions    Readmission Risk Interventions Readmission Risk Prevention Plan 02/03/2019  Transportation Screening Complete  Medication Review Press photographer) Complete  PCP or Specialist appointment within 3-5 days of discharge Complete  HRI or [place] Complete  SW Recovery Care/Counseling Consult Complete  [place] Not Applicable  Some recent data might be hidden

## 2019-02-26 NOTE — Plan of Care (Signed)
  Problem: Education: Goal: Knowledge of General Education information will improve Description: Including pain rating scale, medication(s)/side effects and non-pharmacologic comfort measures Outcome: Progressing   Problem: Health Behavior/Discharge Planning: Goal: Ability to manage health-related needs will improve Outcome: Progressing   Problem: Nutrition: Goal: Adequate nutrition will be maintained Outcome: Progressing   Problem: Pain Managment: Goal: General experience of comfort will improve Outcome: Progressing   Problem: Safety: Goal: Ability to remain free from injury will improve Outcome: Progressing   

## 2019-02-27 ENCOUNTER — Telehealth: Payer: Self-pay

## 2019-02-27 DIAGNOSIS — F0281 Dementia in other diseases classified elsewhere with behavioral disturbance: Secondary | ICD-10-CM

## 2019-02-27 LAB — RENAL FUNCTION PANEL
Albumin: 3.2 g/dL — ABNORMAL LOW (ref 3.5–5.0)
Anion gap: 13 (ref 5–15)
BUN: 33 mg/dL — ABNORMAL HIGH (ref 8–23)
CO2: 26 mmol/L (ref 22–32)
Calcium: 8.5 mg/dL — ABNORMAL LOW (ref 8.9–10.3)
Chloride: 102 mmol/L (ref 98–111)
Creatinine, Ser: 1.96 mg/dL — ABNORMAL HIGH (ref 0.44–1.00)
GFR calc Af Amer: 28 mL/min — ABNORMAL LOW (ref >60)
GFR calc non Af Amer: 24 mL/min — ABNORMAL LOW (ref >60)
Glucose, Bld: 108 mg/dL — ABNORMAL HIGH (ref 70–99)
Phosphorus: 5 mg/dL — ABNORMAL HIGH (ref 2.5–4.6)
Potassium: 3.7 mmol/L (ref 3.5–5.1)
Sodium: 141 mmol/L (ref 135–145)

## 2019-02-27 LAB — CBC
HCT: 35.3 % — ABNORMAL LOW (ref 36.0–46.0)
Hemoglobin: 11 g/dL — ABNORMAL LOW (ref 12.0–15.0)
MCH: 29.2 pg (ref 26.0–34.0)
MCHC: 31.2 g/dL (ref 30.0–36.0)
MCV: 93.6 fL (ref 80.0–100.0)
Platelets: 244 10*3/uL (ref 150–400)
RBC: 3.77 MIL/uL — ABNORMAL LOW (ref 3.87–5.11)
RDW: 17.5 % — ABNORMAL HIGH (ref 11.5–15.5)
WBC: 5.6 10*3/uL (ref 4.0–10.5)
nRBC: 0 % (ref 0.0–0.2)

## 2019-02-27 LAB — MAGNESIUM: Magnesium: 1.6 mg/dL — ABNORMAL LOW (ref 1.7–2.4)

## 2019-02-27 MED ORDER — SODIUM CHLORIDE 0.9 % IV SOLN
INTRAVENOUS | Status: DC | PRN
  Administered 2019-02-27: 250 mL via INTRAVENOUS

## 2019-02-27 MED ORDER — MAGNESIUM SULFATE IN D5W 1-5 GM/100ML-% IV SOLN
1.0000 g | Freq: Once | INTRAVENOUS | Status: AC
  Administered 2019-02-27: 1 g via INTRAVENOUS
  Filled 2019-02-27: qty 100

## 2019-02-27 NOTE — Progress Notes (Signed)
Physical Therapy Treatment Patient Details Name: Hannah [name] MRN: [id number] DOB: Sep 09, 1945 Today's Date: 02/27/2019    History of Present Illness Hannah [name] is a 73 y.o. female presenting with 1 month of worsening fatigue. PMH is significant for  Anemia of chronic disease, Anxiety, HFrEF, CKD stage 3, GFR 30-59 ml/min, Dementia, GERD, HLD, Hypertension, LBBB, Osteoarthritis, S/P mitral valve repair, and Vitamin B12 deficiency. Admitted with fatigue/weakness hemoglobin 5.7 now s/p 2 units PRBC and work up for GI bleed.    PT Comments    Pt moving better today than last session.  Her performance seems to fluctuate with therapy.  Recommend 24/7 S at this time. If pt ends up not going to SNF, would recommend HHPT.   Follow Up Recommendations  SNF;Supervision/Assistance - 24 hour     Equipment Recommendations  None recommended by PT    Recommendations for Other Services       Precautions / Restrictions Precautions Precautions: Fall Restrictions Weight Bearing Restrictions: No    Mobility  Bed Mobility Overal bed mobility: Needs Assistance Bed Mobility: Supine to Sit     Supine to sit: Supervision;HOB elevated     General bed mobility comments: S with bed mobility with HOB elevated  Transfers Overall transfer level: Needs assistance Equipment used: Rolling walker (2 wheeled) Transfers: Sit to/from Stand Sit to Stand: Min guard         General transfer comment: cues for hand placement as pt wanted to pull on RW  Ambulation/Gait Ambulation/Gait assistance: Min guard Gait Distance (Feet): 120 Feet(x2) Assistive device: Rolling walker (2 wheeled) Gait Pattern/deviations: Step-through pattern;Decreased stride length Gait velocity: decreased   General Gait Details: Pt reliant on RW with steady cadence today with improved posture   Stairs             Wheelchair Mobility    Modified Rankin (Stroke Patients Only)       Balance    Sitting-balance support: Feet supported;No upper extremity supported Sitting balance-Leahy Scale: Fair       Standing balance-Leahy Scale: Poor Standing balance comment: requires UE support                            Cognition Arousal/Alertness: Awake/alert Behavior During Therapy: WFL for tasks assessed/performed Overall Cognitive Status: History of cognitive impairments - at baseline                                 General Comments: Oriented to place and person. Not sure why she is in hospital.      Exercises      General Comments        Pertinent Vitals/Pain Pain Assessment: No/denies pain    Home Living                      Prior Function            PT Goals (current goals can now be found in the care plan section) Acute Rehab PT Goals Patient Stated Goal: to go home PT Goal Formulation: With patient Time For Goal Achievement: 03/01/19 Progress towards PT goals: Progressing toward goals    Frequency    Min 3X/week      PT Plan Current plan remains appropriate    Co-evaluation              AM-PAC PT "6 Clicks" Mobility  Outcome Measure  Help needed turning from your back to your side while in a flat bed without using bedrails?: None Help needed moving from lying on your back to sitting on the side of a flat bed without using bedrails?: None Help needed moving to and from a bed to a chair (including a wheelchair)?: A Little Help needed standing up from a chair using your arms (e.g., wheelchair or bedside chair)?: A Little Help needed to walk in hospital room?: A Little Help needed climbing 3-5 steps with a railing? : A Lot 6 Click Score: 19    End of Session Equipment Utilized During Treatment: Gait belt Activity Tolerance: Patient tolerated treatment well Patient left: in chair;with call bell/phone within reach;with chair alarm set Nurse Communication: Mobility status PT Visit Diagnosis: Other  abnormalities of gait and mobility (R26.89);Muscle weakness (generalized) (M62.81)     Time: 6948-5462 PT Time Calculation (min) (ACUTE ONLY): 16 min  Charges:  $Gait Training: 8-22 mins                     Hannah [name], [place] Pager [phone] 02/27/2019    Hannah [name] 02/27/2019, 12:55 PM

## 2019-02-27 NOTE — TOC Progression Note (Signed)
Transition of Care [facility]) - Progression Note    Patient Details  Name: Hannah [name] MRN: [id number] Date of Birth: [birth year]  Transition of Care [facility]) CM/SW [place], [place] Phone Number: 02/27/2019, 3:21 PM  Clinical Narrative:     CSW received notice from PT that the patient has progressed enough to go home with home health.   CSW called and left a message with the [name]. CSW is awaiting a return phone call.   CSW will continue to follow and assist with discharge planning.   Expected Discharge Plan: [place] Barriers to Discharge: Continued Medical Work up, Ship broker, SNF Pending bed offer  Expected Discharge Plan and Services Expected Discharge Plan: [place] arrangements for the past 2 months: Single Family Home                                       Social Determinants of Health (SDOH) Interventions    Readmission Risk Interventions Readmission Risk Prevention Plan 02/03/2019  Transportation Screening Complete  Medication Review Press photographer) Complete  PCP or Specialist appointment within 3-5 days of discharge Complete  HRI or [place] Complete  SW Recovery Care/Counseling Consult Complete  [place] Not Applicable  Some recent data might be hidden

## 2019-02-27 NOTE — Progress Notes (Signed)
Family Medicine Teaching Service Daily Progress Note Intern Pager: [phone]  [name] Medical record number: [id number] Date of birth: May 16, 1945 Age: 73 y.o. Gender: female  Primary Care Provider: [name], MD Consultants: GI Code Status: Full Code   Pt Overview and Major Events to Date:  02/21/2019: admitted for Shortness of Breath 02/23/19: Received 2U pRBCs, total of 4U pRBCs transfused 02/24/19: Enteroscope with multiple angiodysplastic lesions in proximal jejunum s/p APC 02/25/19: Medically stable for discharge to SNF  Assessment and Plan: Hannah Few Robertsis a 73 y.o.femalepresenting with 1 month of worsening fatigue. PMH is significant forAnemia of chronic disease, Anxiety,HFrEF, CKD stage 3, GFR 30-59 ml/min, Dementia, GERD, HLD, Hypertension, LBBB, Osteoarthritis, S/P mitral valve repair, and Vitamin B12 deficiency.  Symptomatic anemia likely 2/2 to GI bleed  Anemia of chronic disease  H/o GI Bleed (11/2018) S/p 4U pRBC transfusion. Hgb 10.4>12.1>11 this AM (baseline Hgb 8-9 on home iron supplementation). VSS overnight. No further signs of bleeding. Home ASA and Plavix (s/p MCR on 10/15) have been held since admission. ASA restarted 11/8. Discontinue Plavix altogether. Patient is medically stable for discharge to SNF. - GI consulted, appreciate recs: resume regular diet, monitor Hgb and transfuse if necessary, discharge home if hgb stable and follow up with Dr. Collene [name] in 1-2 weeks - Transfusion threshold <7 - Plavix discontinued - Continue home ASA - Continue iron supplement - continue PO protonix QD  CKD stage IIIb with AKI 2/2 to Lasix: Cr 1.34>1.50>1.96, BL ~1.3-1.4. Lasix held overnight, continue to hold and watch for improvement. - daily BMP, trend urinary output - avoid nephrotoxic drugs  Hypotension  H/o HTN Mild hypotension 116/50, MAP is 83. Home meds include: Losartan 25mg  QD, Metoprolol 25mg  QD, Lasix 60mg  BID - Continue  home Metoprolol  - Continue to hold home Losartan (hypotension), Lasix (increased cr)  Hypokalemia  Hypomagnesemia  K 3.2>3.4>3.7 this AM, awaiting Mg level this AM - Kdur 55mEq x 1 - monitor daily labs - replete PRN  Prolonged QTc QTc 504 on 11/5. Corrected QTc is 468. Previously consistently in the low 500's.  - avoid QTC prolonging drugs   HFrEFs/p Mitral Valve Repair 01/2019: Most recent echo (01/2019) with EF of 30 to 35%.  She is status post mitral valve repair. Home meds include: Metoprolol 25mg  QD, Lasix 60mg  BID. Weights and UOP not charted yet. - Continue home Metoprolol 25mg  QD  - Hold Lasix 2/2 increased cr - Strict I&O's,Daily weights - Monitor fluid status closelygiven IVF and transfusions   HLD: Home meds: atorvastatin 40 mg - continue home meds  Insomnia Home meds: trazodone - hold trazodone in the setting of sun downing  - ramelteon qhs  Dementia: Patient poor historian.  - Call daughter with updates today   FEN/GI:hearth healthy, protonix  Prophylaxis:SCDs  Disposition: Awaiting SNF placement  Subjective:  No acute events overnight. Patient feels well overall, able to ambulate to bathroom.  Objective: Temp:  [98.1 F (36.7 C)-98.3 F (36.8 C)] 98.3 F (36.8 C) (11/09 0809) Pulse Rate:  [79-88] 79 (11/09 0809) Resp:  [18-20] 18 (11/09 0433) BP: (108-122)/(50-74) 116/50 (11/09 0809) SpO2:  [93 %-99 %] 98 % (11/09 0809) Weight:  [74.1 kg] 74.1 kg (11/09 0626) Physical Exam: General: very pleasant elderly woman,WN,WD, NAD, sitting up in bed excited to order lunch CV: RRR, no m/r/g, no LE edema Lungs: CTAB Abdomen: soft, NT, ND, normal bowel sounds Skin: warm, dry Extremities: warm and well perfused MSK: gait slow but without difficulty Neuro:  pleasantly demented  Laboratory: Recent Labs  Lab 02/25/19 0345 02/26/19 0422 02/27/19 0517  WBC 6.2 5.9 5.6  HGB 10.4* 12.1 11.0*  HCT 32.5* 37.4 35.3*  PLT 269 265 244   Recent  Labs  Lab 02/25/19 0345 02/26/19 0422 02/27/19 0517  NA 143 141 141  K 3.2* 3.4* 3.7  CL 106 102 102  CO2 26 27 26   BUN 19 25* 33*  CREATININE 1.34* 1.50* 1.96*  CALCIUM 8.6* 8.9 8.5*  GLUCOSE 98 114* 108*     Imaging/Diagnostic Tests: No new imaging   Hannah Damme, MD 02/27/2019, 10:57 AM PGY-1, [place] Intern pager: [phone], text pages welcome

## 2019-02-27 NOTE — Progress Notes (Signed)
Pt continues to remove tele device , device reapplied multiple times, pt educated  Will continue to monitor  [place] Generous

## 2019-02-27 NOTE — Care Management Important Message (Signed)
Important Message  Patient Details  Name: Hannah [name] MRN: [id number] Date of Birth: [birth year]   Medicare Important Message Given:  Yes     [name] 02/27/2019, 12:25 PM

## 2019-02-27 NOTE — TOC Progression Note (Signed)
Transition of Care [facility]) - Progression Note    Patient Details  Name: Hannah [name] MRN: [id number] Date of Birth: Nov 28, 1945  Transition of Care [facility]) CM/SW [place], [place] Phone Number: 02/27/2019, 4:35 PM  Clinical Narrative:     CSW called and spoke with the patient's daughter, Hannah [name]. CSW provided bed offers. She is agreeable to go to [facility]. She is not able to provide 24/7 supervision at home due to working full time.   CSW faxed clinicals to [facility] and started insurance authorization. CSW informed MD. Patient will need updated COVID within 48 hours of discharge.   CSW will continue to follow and assist with disposition.   Expected Discharge Plan: [place] Barriers to Discharge: Continued Medical Work up, Ship broker, SNF Pending bed offer  Expected Discharge Plan and Services Expected Discharge Plan: [place] arrangements for the past 2 months: Single Family Home                                       Social Determinants of Health (SDOH) Interventions    Readmission Risk Interventions Readmission Risk Prevention Plan 02/03/2019  Transportation Screening Complete  Medication Review Press photographer) Complete  PCP or Specialist appointment within 3-5 days of discharge Complete  HRI or [place] Complete  SW Recovery Care/Counseling Consult Complete  [place] Not Applicable  Some recent data might be hidden

## 2019-02-27 NOTE — Progress Notes (Addendum)
Informed by night shift patient patient repeatedly removed tele monitor. Patient not wearing tele monitor during handoff. Informed MD [facility] patient removing monitor, therefore, not wearing it during med administration; ok to leave monitor off per MD during bedside assessment.   Informed patient of need to measure urine output and reiterated purpose of urine "hat"/collection container in toilet used to measure urine. Assisted patient with using bedside commode to obtain accurate urine output measurement.   Paged MD [place] at [phone]. Informed pt. 30 mL via bladder scan @ 1400. 100 ml urine at 1500. 2 prior urine occurrence;unable to measure.  1600 during rounds observed IV removed, patient unclear how IV removed upon inquiry, however, IV still connected to tubing on floor by bedside.   Paged [place] at [employer]. Informed pt. removed IV,received most of Mg+ infusion. Pt. > disoriented situation/time in afternoon.Shower privilege?  1848 per [facility] callback, ok to shower, ok with no IV likely d/c tomorrow. Informed patient repeatedly getting up, confused and sundwoing. Will order 1 to 1 sitter and possibly ativan if needed.  Paged fam med at 2002. Spoke with MD Volanda Napoleon and inquired if patient needs covid swab since collected and resulted on 11/8; Volanda Napoleon stated will review order. Per callback from [facility] patient to be d/c on 11/11 therefore do need additional covid swab. Informed night RN

## 2019-02-28 ENCOUNTER — Telehealth: Payer: Self-pay | Admitting: Family Medicine

## 2019-02-28 DIAGNOSIS — R58 Hemorrhage, not elsewhere classified: Secondary | ICD-10-CM | POA: Diagnosis not present

## 2019-02-28 DIAGNOSIS — D62 Acute posthemorrhagic anemia: Secondary | ICD-10-CM | POA: Diagnosis not present

## 2019-02-28 DIAGNOSIS — J069 Acute upper respiratory infection, unspecified: Secondary | ICD-10-CM | POA: Diagnosis not present

## 2019-02-28 DIAGNOSIS — T457X5D Adverse effect of anticoagulant antagonists, vitamin K and other coagulants, subsequent encounter: Secondary | ICD-10-CM | POA: Diagnosis not present

## 2019-02-28 DIAGNOSIS — K922 Gastrointestinal hemorrhage, unspecified: Secondary | ICD-10-CM | POA: Diagnosis not present

## 2019-02-28 DIAGNOSIS — M2550 Pain in unspecified joint: Secondary | ICD-10-CM | POA: Diagnosis not present

## 2019-02-28 DIAGNOSIS — D509 Iron deficiency anemia, unspecified: Secondary | ICD-10-CM | POA: Diagnosis not present

## 2019-02-28 DIAGNOSIS — I509 Heart failure, unspecified: Secondary | ICD-10-CM | POA: Diagnosis not present

## 2019-02-28 DIAGNOSIS — D538 Other specified nutritional anemias: Secondary | ICD-10-CM | POA: Diagnosis not present

## 2019-02-28 DIAGNOSIS — N189 Chronic kidney disease, unspecified: Secondary | ICD-10-CM | POA: Diagnosis not present

## 2019-02-28 DIAGNOSIS — I672 Cerebral atherosclerosis: Secondary | ICD-10-CM | POA: Diagnosis not present

## 2019-02-28 DIAGNOSIS — Z7401 Bed confinement status: Secondary | ICD-10-CM | POA: Diagnosis not present

## 2019-02-28 DIAGNOSIS — I447 Left bundle-branch block, unspecified: Secondary | ICD-10-CM | POA: Diagnosis not present

## 2019-02-28 DIAGNOSIS — E876 Hypokalemia: Secondary | ICD-10-CM | POA: Diagnosis not present

## 2019-02-28 DIAGNOSIS — I5043 Acute on chronic combined systolic (congestive) and diastolic (congestive) heart failure: Secondary | ICD-10-CM | POA: Diagnosis not present

## 2019-02-28 DIAGNOSIS — I5042 Chronic combined systolic (congestive) and diastolic (congestive) heart failure: Secondary | ICD-10-CM | POA: Diagnosis not present

## 2019-02-28 DIAGNOSIS — T457X5S Adverse effect of anticoagulant antagonists, vitamin K and other coagulants, sequela: Secondary | ICD-10-CM | POA: Diagnosis not present

## 2019-02-28 DIAGNOSIS — Z9889 Other specified postprocedural states: Secondary | ICD-10-CM | POA: Diagnosis not present

## 2019-02-28 DIAGNOSIS — M6281 Muscle weakness (generalized): Secondary | ICD-10-CM | POA: Diagnosis not present

## 2019-02-28 DIAGNOSIS — N183 Chronic kidney disease, stage 3 unspecified: Secondary | ICD-10-CM | POA: Diagnosis not present

## 2019-02-28 DIAGNOSIS — Z743 Need for continuous supervision: Secondary | ICD-10-CM | POA: Diagnosis not present

## 2019-02-28 DIAGNOSIS — D638 Anemia in other chronic diseases classified elsewhere: Secondary | ICD-10-CM | POA: Diagnosis not present

## 2019-02-28 DIAGNOSIS — Z954 Presence of other heart-valve replacement: Secondary | ICD-10-CM | POA: Diagnosis not present

## 2019-02-28 DIAGNOSIS — E538 Deficiency of other specified B group vitamins: Secondary | ICD-10-CM | POA: Diagnosis not present

## 2019-02-28 DIAGNOSIS — N179 Acute kidney failure, unspecified: Secondary | ICD-10-CM | POA: Diagnosis not present

## 2019-02-28 DIAGNOSIS — I129 Hypertensive chronic kidney disease with stage 1 through stage 4 chronic kidney disease, or unspecified chronic kidney disease: Secondary | ICD-10-CM | POA: Diagnosis not present

## 2019-02-28 DIAGNOSIS — D649 Anemia, unspecified: Secondary | ICD-10-CM | POA: Diagnosis not present

## 2019-02-28 DIAGNOSIS — I340 Nonrheumatic mitral (valve) insufficiency: Secondary | ICD-10-CM | POA: Diagnosis not present

## 2019-02-28 LAB — SARS CORONAVIRUS 2 (TAT 6-24 HRS): SARS Coronavirus 2: NEGATIVE

## 2019-02-28 LAB — RENAL FUNCTION PANEL
Albumin: 3.2 g/dL — ABNORMAL LOW (ref 3.5–5.0)
Anion gap: 11 (ref 5–15)
BUN: 30 mg/dL — ABNORMAL HIGH (ref 8–23)
CO2: 26 mmol/L (ref 22–32)
Calcium: 8.6 mg/dL — ABNORMAL LOW (ref 8.9–10.3)
Chloride: 102 mmol/L (ref 98–111)
Creatinine, Ser: 1.34 mg/dL — ABNORMAL HIGH (ref 0.44–1.00)
GFR calc Af Amer: 45 mL/min — ABNORMAL LOW (ref >60)
GFR calc non Af Amer: 39 mL/min — ABNORMAL LOW (ref >60)
Glucose, Bld: 111 mg/dL — ABNORMAL HIGH (ref 70–99)
Phosphorus: 4.4 mg/dL (ref 2.5–4.6)
Potassium: 3.6 mmol/L (ref 3.5–5.1)
Sodium: 139 mmol/L (ref 135–145)

## 2019-02-28 LAB — CBC
HCT: 34.2 % — ABNORMAL LOW (ref 36.0–46.0)
Hemoglobin: 10.7 g/dL — ABNORMAL LOW (ref 12.0–15.0)
MCH: 29.6 pg (ref 26.0–34.0)
MCHC: 31.3 g/dL (ref 30.0–36.0)
MCV: 94.5 fL (ref 80.0–100.0)
Platelets: 233 10*3/uL (ref 150–400)
RBC: 3.62 MIL/uL — ABNORMAL LOW (ref 3.87–5.11)
RDW: 16.9 % — ABNORMAL HIGH (ref 11.5–15.5)
WBC: 5.3 10*3/uL (ref 4.0–10.5)
nRBC: 0 % (ref 0.0–0.2)

## 2019-02-28 LAB — MAGNESIUM: Magnesium: 2 mg/dL (ref 1.7–2.4)

## 2019-02-28 MED ORDER — FUROSEMIDE 40 MG PO TABS: 60.0000 mg | ORAL_TABLET | Freq: Every day | ORAL | 0 refills | Status: DC

## 2019-02-28 MED ORDER — RAMELTEON 8 MG PO TABS: 8.0000 mg | ORAL_TABLET | Freq: Every day | ORAL | 0 refills | Status: DC

## 2019-02-28 MED ORDER — FUROSEMIDE 40 MG PO TABS: 40.0000 mg | ORAL_TABLET | Freq: Every day | ORAL | 0 refills | Status: DC

## 2019-02-28 MED ORDER — FUROSEMIDE 40 MG PO TABS
40.0000 mg | ORAL_TABLET | Freq: Every day | ORAL | Status: DC
  Administered 2019-02-28: 40 mg via ORAL
  Filled 2019-02-28: qty 1

## 2019-02-28 NOTE — Progress Notes (Signed)
RN provided NT with permission to assist patient with showering (using shower chair) per MD permission prior evening.   0957 informed MD Owens Shark at bedside patient 1 dark stool this AM.   Called report to Morene Antu at [place] at [phone].  Patient left unit with PTAR at 1820.

## 2019-02-28 NOTE — Telephone Encounter (Signed)
Patient no longer taking.

## 2019-02-28 NOTE — Progress Notes (Signed)
Had telephone discussion with daughter, [name] and son, Derreck.  Answered questions about current state and what might happen in NH.  They, and I, are very appreciative of care of our inpt team.

## 2019-02-28 NOTE — Progress Notes (Signed)
Pt has no 1on 1 sitter available throughout  the night. Patient slept fine and no issues.pt was compliant.  covid test  Collected and sent to lab- for discharge to SNF on 11th as per teaching service.

## 2019-02-28 NOTE — TOC Transition Note (Signed)
Transition of Care [facility]) - CM/SW Discharge Note   Patient Details  Name: Hannah [name] MRN: [id number] Date of Birth: May 31, 1945  Transition of Care [facility]) CM/SW Contact:  Alberteen Sam, LCSW Phone Number: 02/28/2019, 3:51 PM   Clinical Narrative:     Patient will DC to: [facility] Anticipated DC date: 02/28/2019 Family notified: [name] (daughter) Transport ST:[facility]  Per MD patient ready for DC to [place] . RN, patient, patient's family, and facility notified of DC. Discharge Summary sent to facility. RN given number for report   [phone]. DC packet on chart. Ambulance transport requested for patient.  CSW signing off.  [place], [place]   Final next level of care: Skilled Nursing Facility Barriers to Discharge: No Barriers Identified   Patient Goals and CMS Choice Patient states their goals for this hospitalization and ongoing recovery are:: to go to rehab CMS Medicare.gov Compare Post Acute Care list provided to:: Patient Represenative (must comment)(daughter [name]) Choice offered to / list presented to : Adult Children  Discharge Placement PASRR number recieved: 02/28/19            Patient chooses bed at: [facility] Patient to be transferred to facility by: [place] Name of family member notified: [name] (daughter) Patient and family notified of of transfer: 02/28/19  Discharge Plan and Services                                     Social Determinants of Health (SDOH) Interventions     Readmission Risk Interventions Readmission Risk Prevention Plan 02/03/2019  Transportation Screening Complete  Medication Review Press photographer) Complete  PCP or Specialist appointment within 3-5 days of discharge Complete  HRI or [place] Complete  SW Recovery Care/Counseling Consult Complete  [place] Not Applicable  Some recent data might be hidden

## 2019-02-28 NOTE — Progress Notes (Signed)
Roselie Awkward to be D/C'd Skilled nursing facility per MD order.  Discussed with the patient and all questions fully answered.  VSS, Skin clean, dry and intact without evidence of skin break down, no evidence of skin tears noted.  IV catheter discontinued intact. Site without signs and symptoms of complications. Dressing and pressure applied.  An After Visit Summary was printed and given to patient/PTAR. Patient received prescription.  D/c education completed with patient/family including follow up instructions, medication list, d/c activities limitations if indicated, with other d/c instructions as indicated by MD - patient able to verbalize understanding, all questions fully answered. Report called to [facility] at [phone].  Patient instructed to return to ED, call 911, or call MD for any changes in condition.   Patient left unit with PTAR staff.  Howard Pouch 02/28/2019 7:49 PM

## 2019-02-28 NOTE — Telephone Encounter (Signed)
Received appropriate number to [facility]. Faxed over updated copy of discharge summary. Spoke with front desk clerk, detailing the change to lasxi 40mg  to 60mg  daily.  Guadalupe Dawn MD PGY-3 Family Medicine Resident

## 2019-02-28 NOTE — TOC Progression Note (Addendum)
Transition of Care [facility]) - Progression Note    Patient Details  Name: Hannah [name] MRN: [id number] Date of Birth: 05/26/1945  Transition of Care [facility]) CM/SW [place], [place] Phone Number: [phone] 02/28/2019, 9:38 AM  Clinical Narrative:     Update: PASRR number received, [id number],  Insurance [place] received, Ref #[id number], Josem Kaufmann ID: [id number]   CSW notes patient's PASRR number is pending, all clinicals have been faxed to [facility] Must, pending PASRR number at this time.    Expected Discharge Plan: [place] Barriers to Discharge: Continued Medical Work up, Ship broker, SNF Pending bed offer  Expected Discharge Plan and Services Expected Discharge Plan: [place] arrangements for the past 2 months: Single Family Home                                       Social Determinants of Health (SDOH) Interventions    Readmission Risk Interventions Readmission Risk Prevention Plan 02/03/2019  Transportation Screening Complete  Medication Review Press photographer) Complete  PCP or Specialist appointment within 3-5 days of discharge Complete  HRI or [place] Complete  SW Recovery Care/Counseling Consult Complete  [place] Not Applicable  Some recent data might be hidden

## 2019-02-28 NOTE — Progress Notes (Addendum)
Family Medicine Teaching Service Daily Progress Note Intern Pager: [phone]  [name] Medical record number: [id number] Date of birth: 1945/09/12 Age: 73 y.o. Gender: female  Primary Care Provider: [name], MD Consultants: GI Code Status: Full Code   Pt Overview and Major Events to Date:  02/21/2019: admitted for Shortness of Breath 02/23/19: Received 2U pRBCs, total of 4U pRBCs transfused 02/24/19: Enteroscope with multiple angiodysplastic lesions in proximal jejunum s/p APC 02/25/19: Medically stable for discharge to SNF  Assessment and Plan: Hannah Cespedes Robertsis a 73 y.o.femalepresenting with 1 month of worsening fatigue. PMH is significant forAnemia of chronic disease, Anxiety,HFrEF, CKD stage 3, GFR 30-59 ml/min, Dementia, GERD, HLD, Hypertension, LBBB, Osteoarthritis, S/P mitral valve repair, and Vitamin B12 deficiency.  Symptomatic anemia likely 2/2 to GI bleed  Anemia of chronic disease  H/o GI Bleed (11/2018) S/p 4U pRBC transfusion. Hgb stable at 10.7 (baseline Hgb 8-9 on home iron supplementation). VSS overnight. No further signs of bleeding. Pt admitted on regimen of ASA and Plavix (s/p MCR on 10/15): ASA restarted on 11/8, Plavix discontinued. Patient is medically stable and ready for discharge to SNF. Patient not on heparin due to recent GIB, fall risk with SCDs and dementia, pt ambulating well, ASA restarted. - GI consulted, appreciate recs: resume regular diet, monitor Hgb and transfuse if necessary, discharge home if hgb stable and follow up with Dr. Collene Mares in 1-2 weeks - Transfusion threshold <7 - Continue home ASA - Continue iron supplement - continue PO protonix QD  Acute kidney injury on CKD stage IIIb: Most likely prerenal in nature, possibly due to confluence of hypotension with GI bleeding and lasix. Cr improved to 1.34 from 1.96 yesterday, BL ~1.3-1.4. Restart lasix at 40 mg qd. - daily BMP, trend urinary output - avoid  nephrotoxic drugs  Hypotension  H/o HTN Mild hypotension 119/54, MAP >80. Home meds include: Losartan 25mg  QD, Metoprolol 25mg  QD, Lasix 60mg  BID - Continue home Metoprolol, lasix restart at 40 mg qd - Continue to hold home Losartan (hypotension)  Hypokalemia  Hypomagnesemia stable and improved K 3.6 this AM, Mg level  - monitor daily labs - replete PRN  Prolonged QTc QTc 504 on 11/5. Corrected QTc is 468. Previously consistently in the low 500's.  - avoid QTC prolonging drugs   HFrEFs/p Mitral Valve Repair 01/2019: Most recent echo (01/2019) with EF of 30 to 35%.  She is status post mitral valve repair. Home meds include: Metoprolol 25mg  QD, Lasix 60mg  BID. UOP 500 cc yesterday with 2 events where patient urinated w/o informing nursing, consequently unmeasured. - Continue home Metoprolol 25mg  QD  - restart lasix 40 mg qd - Strict I&O's,Daily weights - Monitor fluid status closelygiven IVF and transfusions   HLD: Home meds: atorvastatin 40 mg - continue home meds  Insomnia Home meds: trazodone - hold trazodone in the setting of sun downing  - ramelteon qhs  Dementia: Patient poor historian.  - Call daughter with updates   FEN/GI:he[facility] healthy, protonix  Prophylaxis:SCDs  Disposition: Awaiting SNF placement  Subjective:  No acute events overnight. Patient feels well overall, able to ambulate to bathroom.  Objective: Temp:  [98 F (36.7 C)-98.8 F (37.1 C)] 98 F (36.7 C) (11/10 0358) Pulse Rate:  [74-82] 74 (11/10 0358) Resp:  [16-18] 18 (11/10 0358) BP: (102-119)/(50-75) 119/54 (11/10 0358) SpO2:  [96 %-99 %] 97 % (11/10 0358) Weight:  [74.1 kg] 74.1 kg (11/09 0626) Physical Exam: General: very pleasant elderly woman,WN,WD, NAD, sitting up  in bed excited to order lunch CV: RRR, no m/r/g, no LE edema Lungs: CTAB Abdomen: soft, NT, ND, normal bowel sounds Skin: warm, dry Extremities: warm and well perfused MSK: gait slow but without  difficulty Neuro: pleasantly demented  Laboratory: Recent Labs  Lab 02/25/19 0345 02/26/19 0422 02/27/19 0517  WBC 6.2 5.9 5.6  HGB 10.4* 12.1 11.0*  HCT 32.5* 37.4 35.3*  PLT 269 265 244   Recent Labs  Lab 02/25/19 0345 02/26/19 0422 02/27/19 0517  NA 143 141 141  K 3.2* 3.4* 3.7  CL 106 102 102  CO2 26 27 26   BUN 19 25* 33*  CREATININE 1.34* 1.50* 1.96*  CALCIUM 8.6* 8.9 8.5*  GLUCOSE 98 114* 108*     Imaging/Diagnostic Tests: No new imaging   [name], MD 02/28/2019, 4:30 AM PGY-1, [place] Intern pager: [phone], text pages welcome

## 2019-03-01 ENCOUNTER — Non-Acute Institutional Stay (SKILLED_NURSING_FACILITY): Payer: Medicare Other | Admitting: Adult Health

## 2019-03-01 ENCOUNTER — Encounter: Payer: Self-pay | Admitting: Adult Health

## 2019-03-01 DIAGNOSIS — E538 Deficiency of other specified B group vitamins: Secondary | ICD-10-CM | POA: Diagnosis not present

## 2019-03-01 DIAGNOSIS — K922 Gastrointestinal hemorrhage, unspecified: Secondary | ICD-10-CM

## 2019-03-01 DIAGNOSIS — Z9889 Other specified postprocedural states: Secondary | ICD-10-CM

## 2019-03-01 DIAGNOSIS — D649 Anemia, unspecified: Secondary | ICD-10-CM | POA: Diagnosis not present

## 2019-03-01 DIAGNOSIS — I5043 Acute on chronic combined systolic (congestive) and diastolic (congestive) heart failure: Secondary | ICD-10-CM | POA: Diagnosis not present

## 2019-03-01 DIAGNOSIS — G4700 Insomnia, unspecified: Secondary | ICD-10-CM

## 2019-03-01 LAB — UREA NITROGEN, URINE: Urea Nitrogen, Ur: 1062 mg/dL

## 2019-03-01 NOTE — Progress Notes (Signed)
Location:  [place] Room Number: [place] of Service:  SNF (31) Provider:  [name], DNP, FNP-BC  Patient Care Team: [name], MD as PCP - General [name], MD as PCP - Cardiology (Cardiology) [name]  Extended Emergency Contact Information Primary Emergency Contact: [name] of [place] Phone: [phone] Mobile Phone: [phone] Relation: Daughter  Code Status:  Full Code  Goals of care: Advanced Directive information Advanced Directives 02/22/2019  Does Patient Have a Medical Advance Directive? Yes  Type of Advance Directive Living will  Does patient want to make changes to medical advance directive? No - Patient declined  Copy of [place] in Chart? -  Would patient like information on creating a medical advance directive? -     Chief Complaint  Patient presents with   Acute Visit    Follow-up hospitalization    HPI:  Pt is a 73 y.o. [name] who was admitted to [facility] on 02/28/19 post hospitalization 02/21/19 to11/10/20 due to anemia secondary to GI bleed. She presented to ED with weakness and shortness of breath X 2 days. She has PMH of anemia of chronic disease, anxiety, chronic kidney disease stage III, dementia, GERD, hyperlipidemia, hypertension, LBBB, osteoarthritis, s/p mitral valve repair and vitamin B12 deficiency. On 02/02/2019, she was started on Plavix and aspirin after a mitral valve clip. FOBT was positive and hemoglobin was 5.7. GI was consulted.  He was made n.p.o., IV Protonix was started and transfused 2 units of packed RBCs with resulting hemoglobin of 8.0.  Her hemoglobin dropped to 7.6 on the a.m. of 11/5 and transfuse 2 additional units of packed RBCs with resultant hemoglobin of 10.5.  Enteroscopy was performed on 11/6 showing normal esophagus, normal stomach, normal duodenum, however, multiple angiodysplastic  lesions with stigmata of recent bleeding were found in the proximal jejunum.  Coagulation was successful.  It was recommended to continue PPI 40 mg daily. Her hemoglobin remained around 11 x 3 days prior to discharge.  Aspirin was restarted on 11/8 and Plavix was discontinued.  She will follow-up with GI, Dr. Collene Mares, in 1 to 2 weeks.  Her creatinine peaked at 1.96 which was thought to be due to hypotension with GI bleeding and Lasix.  Latest creatinine improved to 1.34 and Lasix was restarted at 40 mg daily.  She was seen in her room today.  She was wondering why she is in the facility.  She denies any complaints.   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   S/P mitral valve repair    s/p MitraClip on 02/03/19   Vitamin B12 deficiency 12/28/2018   Past Surgical History:  Procedure Laterality Date   BIOPSY  10/28/2018   Procedure: BIOPSY;  Surgeon: Juanita Craver, MD;  Location: [facility] ENDOSCOPY;  Service: Endoscopy;;   CARDIAC CATHETERIZATION  05/2004   COLONOSCOPY  07/2002   Archie Endo  09/02/2010   COLONOSCOPY WITH PROPOFOL N/A 10/28/2018   Procedure: COLONOSCOPY WITH PROPOFOL;  Surgeon: Juanita Craver, MD;  Location: [facility] ENDOSCOPY;  Service: Endoscopy;  Laterality: N/A;   ENTEROSCOPY N/A 02/24/2019   Procedure: ENTEROSCOPY;  Surgeon: Carol Ada, MD;  Location: [facility];  Service: Endoscopy;  Laterality: N/A;   ESOPHAGOGASTRODUODENOSCOPY  07/2002   w/biopsies/notes 09/02/2010   ESOPHAGOGASTRODUODENOSCOPY  (EGD) WITH PROPOFOL N/A 10/28/2018   Procedure: ESOPHAGOGASTRODUODENOSCOPY (EGD) WITH PROPOFOL;  Surgeon: Juanita Craver, MD;  Location: [facility] ENDOSCOPY;  Service: Endoscopy;  Laterality: N/A;   GIVENS CAPSULE STUDY N/A 02/23/2019   Procedure: GIVENS CAPSULE STUDY;  Surgeon: Carol Ada, MD;  Location: [facility];  Service: Endoscopy;  Laterality: N/A;   HOT HEMOSTASIS N/A 10/28/2018   Procedure: HOT HEMOSTASIS (ARGON PLASMA COAGULATION/BICAP);  Surgeon: Juanita Craver, MD;  Location: [facility] ENDOSCOPY;  Service: Endoscopy;  Laterality: N/A;   HOT HEMOSTASIS N/A 02/24/2019   Procedure: HOT HEMOSTASIS (ARGON PLASMA COAGULATION/BICAP);  Surgeon: Carol Ada, MD;  Location: [facility];  Service: Endoscopy;  Laterality: N/A;   IR ANGIOGRAM SELECTIVE EACH ADDITIONAL VESSEL  10/30/2018   IR ANGIOGRAM SELECTIVE EACH ADDITIONAL VESSEL  10/30/2018   IR ANGIOGRAM SELECTIVE EACH ADDITIONAL VESSEL  10/30/2018   IR ANGIOGRAM SELECTIVE EACH ADDITIONAL VESSEL  10/30/2018   IR ANGIOGRAM VISCERAL SELECTIVE  10/30/2018   IR EMBO ART  VEN HEMORR LYMPH EXTRAV  INC GUIDE ROADMAPPING  10/30/2018   IR US GUIDANCE  10/30/2018   MITRAL VALVE REPAIR N/A 02/02/2019   Procedure: MITRAL VALVE REPAIR;  Surgeon: Sherren Mocha, MD;  Location: [place] CV LAB;  Service: Cardiovascular;  Laterality: N/A;   POLYPECTOMY  10/28/2018   Procedure: POLYPECTOMY;  Surgeon: Juanita Craver, MD;  Location: [facility] ENDOSCOPY;  Service: Endoscopy;;   RIGHT/LEFT HEART CATH AND CORONARY ANGIOGRAPHY N/A 12/29/2018   Procedure: RIGHT/LEFT HEART CATH AND CORONARY ANGIOGRAPHY;  Surgeon: Leonie Man, MD;  Location: [place] CV LAB;  Service: Cardiovascular;  Laterality: N/A;   TEE WITHOUT CARDIOVERSION N/A 12/30/2018   Procedure: TRANSESOPHAGEAL ECHOCARDIOGRAM (TEE);  Surgeon: Lelon Perla, MD;  Location: [facility] ENDOSCOPY;  Service: Cardiovascular;  Laterality: N/A;    No Known Allergies  Outpatient Encounter Medications as of 03/01/2019    Medication Sig   acetaminophen (TYLENOL) 325 MG tablet Take 650 mg by mouth every 6 (six) hours as needed for mild pain or headache.   aspirin 81 MG chewable tablet Chew 1 tablet (81 mg total) by mouth daily.   atorvastatin (LIPITOR) 40 MG tablet Take 1 tablet (40 mg total) by mouth daily.   cyanocobalamin 1000 MCG tablet Take 1 tablet (1,000 mcg total) by mouth daily.   ferrous sulfate 324 MG TBEC Take 324 mg by mouth daily as needed.    furosemide (LASIX) 40 MG tablet Take 1 tablet (40 mg total) by mouth daily.   metoprolol succinate (TOPROL-XL) 25 MG 24 hr tablet Take 1 tablet (25 mg total) by mouth daily. Take with or immediately following a meal.   pantoprazole (PROTONIX) 40 MG tablet Take 1 tablet (40 mg total) by mouth daily.   ramelteon (ROZEREM) 8 MG tablet Take 1 tablet (8 mg total) by mouth at bedtime.   No facility-administered encounter medications on file as of 03/01/2019.     Review of Systems  GENERAL: No change in appetite, no fatigue, no weight changes, no fever, chills or weakness MOUTH and THROAT: Denies oral discomfort, gingival pain or bleeding RESPIRATORY: no cough, SOB, DOE, wheezing, hemoptysis CARDIAC: No chest  pain, edema or palpitations GI: No abdominal pain, diarrhea, constipation, heart burn, nausea or vomiting GU: Denies dysuria, frequency, hematuria, incontinence, or discharge NEUROLOGICAL: Denies dizziness, syncope, numbness, or headache PSYCHIATRIC: Denies feelings of depression or anxiety. No report of hallucinations, insomnia, paranoia, or agitation    Immunization History  Administered Date(s) Administered   Influenza Split 01/21/2011, 02/01/2012, 02/09/2013   Influenza Whole 02/08/2009, 02/03/2010   Influenza,inj,Quad PF,6+ Mos 02/21/2014, 02/11/2015, 12/31/2015   Influenza-Unspecified 01/19/2018   Pneumococcal Conjugate-13 02/20/2015   Pneumococcal Polysaccharide-23 04/01/2009   Tdap 06/03/2011   Pertinent  Health  Maintenance Due  Topic Date Due   INFLUENZA VACCINE  11/19/2018   COLONOSCOPY  10/27/2028   DEXA SCAN  Completed   PNA vac Low Risk Adult  Completed   Fall Risk  11/29/2018 09/26/2018 05/31/2018 02/02/2018 11/09/2017  Falls in the past year? 0 0 0 No No  Number falls in past yr: 0 - - - -     Vitals:   03/01/19 1716  BP: (!) 118/58  Pulse: 90  Resp: 20  Temp: (!) 96.9 F (36.1 C)  Weight: 164 lb (74.4 kg)  Height: 5\' 2"  (1.575 m)   Body mass index is 30 kg/m.  Physical Exam  GENERAL APPEARANCE: Well nourished. In no acute distress. Obese SKIN:  Skin is warm and dry.  MOUTH and THROAT: Lips are without lesions. Oral mucosa is moist and without lesions. Tongue is normal in shape, size, and color and without lesions RESPIRATORY: Breathing is even & unlabored, BS CTAB CARDIAC: RRR, no murmur,no extra heart sounds, no edema GI: Abdomen soft, normal BS, no masses, no tenderness EXTREMITIES:  Able to move X 4 extremities.  NEUROLOGICAL: There is no tremor. Speech is clear. Alert and oriented X 3. PSYCHIATRIC:  Affect and behavior are appropriate  Labs reviewed: Recent Labs    02/26/19 0422 02/27/19 0517 02/28/19 0454  NA 141 141 139  K 3.4* 3.7 3.6  CL 102 102 102  CO2 27 26 26   GLUCOSE 114* 108* 111*  BUN 25* 33* 30*  CREATININE 1.50* 1.96* 1.34*  CALCIUM 8.9 8.5* 8.6*  MG 1.5* 1.6* 2.0  PHOS  --  5.0* 4.4   Recent Labs    12/10/18 0242 12/18/18 0230 01/30/19 1356 02/27/19 0517 02/28/19 0454  AST 14* 21 19  --   --   ALT 10 13 12   --   --   ALKPHOS 92 105 101  --   --   BILITOT 1.0 0.8 0.8  --   --   PROT 6.3* 6.1* 7.2  --   --   ALBUMIN 3.5 3.3* 3.8 3.2* 3.2*   Recent Labs    01/17/19 2359  02/21/19 1530 02/22/19 0758  02/26/19 0422 02/27/19 0517 02/28/19 0454  WBC 9.5   < > 5.5 6.5   < > 5.9 5.6 5.3  NEUTROABS 7.2  --  3.4 4.2  --   --   --   --   HGB 8.9*   < > 5.7* 8.0*   < > 12.1 11.0* 10.7*  HCT 28.5*   < > 18.5* 24.8*   < > 37.4 35.3*  34.2*  MCV 97.9   < > 101.1* 90.8   < > 92.1 93.6 94.5  PLT 303   < > 365 300   < > 265 244 233   < > = values in this interval not displayed.   Lab Results  Component Value Date   TSH 4.175  12/09/2018   Lab Results  Component Value Date   HGBA1C 4.4 (L) 01/30/2019   Lab Results  Component Value Date   CHOL 127 12/09/2018   HDL Hannah 12/09/2018   LDLCALC 63 12/09/2018   TRIG 44 12/09/2018   CHOLHDL 2.3 12/09/2018    Significant Diagnostic Results in last 30 days:  Dg Chest 2 View  Result Date: 02/21/2019 CLINICAL DATA:  Shortness of breath and chest pain for a long time. History of gastroesophageal reflux disease, hypertension and chronic kidney disease. EXAM: CHEST - 2 VIEW COMPARISON:  Radiographs 01/30/2019. CT 11/06/2018. FINDINGS: The heart size and mediastinal contours are stable. There is mild aortic atherosclerosis. There are stable mild emphysematous changes without superimposed edema, confluent airspace opacity, pleural effusion or pneumothorax. The bones are intact. Telemetry leads overlie the chest. IMPRESSION: Stable chest. No active cardiopulmonary process. Electronically Signed   By: Richardean Sale M.D.   On: 02/21/2019 15:10    Assessment/Plan  1. Symptomatic anemia Lab Results  Component Value Date   HGB 10.7 (L) 02/28/2019  - secondary to GI bleed, S/P 4 units of pRBCs, continue ferrous sulfate 324 mg 1 tab daily  2. S/P mitral valve repair - repair done on 02/02/19, Plavix was discontinued, to continue aspirin 81 mg 1 tab daily and atorvastatin 40 mg 1 tab daily  3. Gastrointestinal hemorrhage, unspecified gastrointestinal hemorrhage type -Enteroscopy on 11/6 showed normal esophagus, normal stomach, normal duodenum, however, found multiple angiodysplastic lesions with stigmata of recent bleeding in the proximal jejunum, continue Protonix 40 mg 1 tab daily  4. Acute on chronic combined systolic and diastolic CHF (congestive heart failure) (HCC) -No SOB,  continue metoprolol succinate ER 25 mg 1 tab daily and Lasix 40 mg 1.5 tab = 60 mg daily  -Daily weights, if gain of 3  lbs in a day or 5  lbs in a week, give additional Lasix 60 mg  5. Vitamin B12 deficiency Lab Results  Component Value Date   VITAMINB12 173 (L) 12/28/2018  -Continue vitamin B12 1000 mcg 1 tab daily  6. Insomnia, unspecified type -Continue ramelteon 8 mg 1 tab at bedtime    Family/ staff Communication: Discussed plan of care with resident.  Labs/tests ordered:  CBC and BMP  Goals of care:  Short-term care   [name], DNP, FNP-BC [facility] [phone] (Monday-Friday 8:00 a.m. - 5:00 p.m.) [phone] (after hours)

## 2019-03-01 NOTE — Progress Notes (Signed)
[facility] [place]                                       Cardiology Office Note    Date:  03/02/2019   ID:  Hannah [name], Hannah [name] 1946/02/09, MRN [id number]  PCP:  [name], MD  Cardiologist: Dr. Johnsie Cancel  CC: 1 month s/p MitraClip  History of Present Illness:  Hannah [name] is a 73 y.o. female with a history of chronic diastolic CHF, HTN, dementia, GI bleeding,anxiety and severe mitral regurgitation s/p MitraClip (02/02/19) who presents to clinic for follow up.   Patient and her family deny any long history of cardiac disease or heart murmur. The patient has developed memory loss and anxiety which has slowly progressed over the last few years. She lives locally in [place] with 1 of her daughters and has remained functionally independent for the most part. However, she stopped driving her automobile more than 2 years ago. She has reportedly developed progressive exertional shortness of breath and anxiety. Since early July she has been seen in the emergency department 11 times and hospitalized on 5 different occasions with acute exacerbation of congestive heart failure and pulmonary edema. At the time of her initial presentation she was notably anemic with active GI bleeding for which she underwent mesenteric artery coil embolization. Echocardiogram performed at that time revealed normal left ventricular systolic function with moderate mitral regurgitation. Most recently she was hospitalized with another exacerbation of congestive heart failure and transesophageal echocardiogram revealed low normal left ventricular systolic function with severe mitral regurgitation. Diagnostic cardiac catheterization was notable for the absence of significant coronary artery disease and moderate pulmonary hypertension.   She was evaluated by the multidisciplinary valve team and underwent successful transcatheter edge-to-edge mitral valve  repair with a single MitraClip NTR placed A2/P2, reducing MR from severe at baseline to 1-2+, LA V wave reduced by 50% from baseline. Groin site is healing well. Post op echo showed EF 30-35%, with mild residual MR and moderate MS (transmitral gradient 7 mm HG). Given reduction in EF, her lopressor was changed to Toprol Xl 25mg  daily and Losartan 25mg  daily was added. She was discharged on aspirin and plavix.   Unfortunately, she was readmitted from 11/3-11/10/20 for symptomatic anemia 2/2 GI bleed. Hg down to 5.7. She was transfused. FOBT positive. Enteroscopy was performed on 11/6 showing normal esophagus, normal stomach, normal duodenum; however, multiple angiodysplastic lesions with stigmata of recent bleeding were found in the proximal jejunum. Coagulation was successful.  She was continued on Protonix 40mg  daily. Patient's hemoglobin remained around 11 for 3 days prior to discharge. She had AKI and losartan discontinued and lasix decreased to 40mg  daily. Plavix was discontinued and she was discharged to [facility].   Today she presents to clinic for follow up. Feeling good but still recovering from her recent Gi bleed. No CP or SOB. Mild LE edema and now on lasix 40mg  daily. No orthopnea or PND. No dizziness or syncope. No more blood in stool or urine. No palpitations. Hasn't been very active since admitted but looking forward to going home and getting back to her normal activities. Her daughter will be moving in with her.    Past Medical History:  Diagnosis Date  . Anemia   . Anemia of chronic disease   . Anxiety   . Arthritis    "  hands" (05/09/2015)  . Chronic diastolic heart failure ([place])   . CKD (chronic kidney disease) stage 3, GFR 30-59 ml/min   . Dementia ([place])   . Elevated TSH 02/03/2018  . GERD (gastroesophageal reflux disease)   . HLD (hyperlipidemia)   . Hypertension   . LBBB (left bundle branch block)   . Mitral valve insufficiency   . Nonrheumatic mitral valve  regurgitation 02/02/2019  . Osteoarthritis   . 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 S/P mitral valve repair    s/p MitraClip on 02/03/19  . Vitamin B12 deficiency 12/28/2018    Past Surgical History:  Procedure Laterality Date  . BIOPSY  10/28/2018   Procedure: BIOPSY;  Surgeon: Juanita Craver, MD;  Location: [facility] ENDOSCOPY;  Service: Endoscopy;;  . CARDIAC CATHETERIZATION  05/2004  . COLONOSCOPY  07/2002   Archie Endo 09/02/2010  . COLONOSCOPY WITH PROPOFOL N/A 10/28/2018   Procedure: COLONOSCOPY WITH PROPOFOL;  Surgeon: Juanita Craver, MD;  Location: [facility] ENDOSCOPY;  Service: Endoscopy;  Laterality: N/A;  . ENTEROSCOPY N/A 02/24/2019   Procedure: ENTEROSCOPY;  Surgeon: Carol Ada, MD;  Location: [place];  Service: Endoscopy;  Laterality: N/A;  . ESOPHAGOGASTRODUODENOSCOPY  07/2002   w/biopsies/notes 09/02/2010  . ESOPHAGOGASTRODUODENOSCOPY (EGD) WITH PROPOFOL N/A 10/28/2018   Procedure: ESOPHAGOGASTRODUODENOSCOPY (EGD) WITH PROPOFOL;  Surgeon: Juanita Craver, MD;  Location: [facility] ENDOSCOPY;  Service: Endoscopy;  Laterality: N/A;  . GIVENS CAPSULE STUDY N/A 02/23/2019   Procedure: GIVENS CAPSULE STUDY;  Surgeon: Carol Ada, MD;  Location: [place];  Service: Endoscopy;  Laterality: N/A;  . HOT HEMOSTASIS N/A 10/28/2018   Procedure: HOT HEMOSTASIS (ARGON PLASMA COAGULATION/BICAP);  Surgeon: Juanita Craver, MD;  Location: [facility] ENDOSCOPY;  Service: Endoscopy;  Laterality: N/A;  . HOT HEMOSTASIS N/A 02/24/2019   Procedure: HOT HEMOSTASIS (ARGON PLASMA COAGULATION/BICAP);  Surgeon: Carol Ada, MD;  Location: [place];  Service: Endoscopy;  Laterality: N/A;  . IR ANGIOGRAM SELECTIVE EACH ADDITIONAL VESSEL  10/30/2018  . IR ANGIOGRAM SELECTIVE EACH ADDITIONAL VESSEL  10/30/2018  . IR ANGIOGRAM SELECTIVE EACH ADDITIONAL VESSEL  10/30/2018  . IR ANGIOGRAM SELECTIVE EACH ADDITIONAL VESSEL  10/30/2018  . IR ANGIOGRAM VISCERAL SELECTIVE  10/30/2018  . IR EMBO ART  VEN HEMORR LYMPH EXTRAV  INC GUIDE ROADMAPPING   10/30/2018  . IR US GUIDANCE  10/30/2018  . MITRAL VALVE REPAIR N/A 02/02/2019   Procedure: MITRAL VALVE REPAIR;  Surgeon: Sherren Mocha, MD;  Location: [place] CV LAB;  Service: Cardiovascular;  Laterality: N/A;  . POLYPECTOMY  10/28/2018   Procedure: POLYPECTOMY;  Surgeon: Juanita Craver, MD;  Location: [facility] ENDOSCOPY;  Service: Endoscopy;;  . RIGHT/LEFT HEART CATH AND CORONARY ANGIOGRAPHY N/A 12/29/2018   Procedure: RIGHT/LEFT HEART CATH AND CORONARY ANGIOGRAPHY;  Surgeon: Leonie Man, MD;  Location: [place] CV LAB;  Service: Cardiovascular;  Laterality: N/A;  . TEE WITHOUT CARDIOVERSION N/A 12/30/2018   Procedure: TRANSESOPHAGEAL ECHOCARDIOGRAM (TEE);  Surgeon: Lelon Perla, MD;  Location: [facility] ENDOSCOPY;  Service: Cardiovascular;  Laterality: N/A;    Current Medications: Outpatient Medications Prior to Visit  Medication Sig Dispense Refill  . acetaminophen (TYLENOL) 325 MG tablet Take 650 mg by mouth every 6 (six) hours as needed for mild pain or headache.    Marland Kitchen aspirin 81 MG chewable tablet Chew 1 tablet (81 mg total) by mouth daily.    Marland Kitchen atorvastatin (LIPITOR) 40 MG tablet Take 1 tablet (40 mg total) by mouth daily. 90 tablet 3  . cyanocobalamin 1000 MCG tablet Take 1 tablet (1,000 mcg total) by mouth  daily. 30 tablet 0  . ferrous sulfate 324 MG TBEC Take 324 mg by mouth daily as needed.     . furosemide (LASIX) 40 MG tablet Take 60 mg by mouth daily.     . metoprolol succinate (TOPROL-XL) 25 MG 24 hr tablet Take 1 tablet (25 mg total) by mouth daily. Take with or immediately following a meal. 90 tablet 3  . NON FORMULARY Diet Regular    . pantoprazole (PROTONIX) 40 MG tablet Take 1 tablet (40 mg total) by mouth daily. 90 tablet 1  . ramelteon (ROZEREM) 8 MG tablet Take 1 tablet (8 mg total) by mouth at bedtime. 30 tablet 0   No facility-administered medications prior to visit.      Allergies:   Plavix [clopidogrel]   Social History   Socioeconomic History  . Marital  status: Widowed    Spouse name: Chrissie Noa  . Number of children: 4  . Years of education: 74  . Highest education level: High school graduate  Occupational History  . Occupation: Retired     Comment: Geographical information systems officer  . Financial resource strain: Not hard at all  . Food insecurity    Worry: Never true    Inability: Never true  . Transportation needs    Medical: No    Non-medical: No  Tobacco Use  . Smoking status: Former Smoker    Packs/day: 0.30    Years: 8.00    Pack years: 2.40    Types: Cigarettes    Quit date: 05/30/2004    Years since quitting: 14.7  . Smokeless tobacco: Never Used  Substance and Sexual Activity  . Alcohol use: No    Alcohol/week: 0.0 standard drinks  . Drug use: Never  . Sexual activity: Not Currently  Lifestyle  . Physical activity    Days per week: 0 days    Minutes per session: 0 min  . Stress: Not at all  Relationships  . Social connections    Talks on phone: More than three times a week    Gets together: More than three times a week    Attends religious service: More than 4 times per year    Active member of club or organization: Yes    Attends meetings of clubs or organizations: Never    Relationship status: Widowed  Other Topics Concern  . Not on file  Social History Narrative   Emergency Contact: [name] [phone]   End of Life Plan: gave pt ad pamplet   Any pets: 1 Dog    Diet: pt has a varied diet, low consumption of carbs and fatty foods    Exercise: pt does not have a regular exercise routine    Seatbelts: pt wears seat regularly in car   Hobbies: spending time with grandchildren      *Updated 09/26/2018*   Patients daughter [name] lives with her and assists her in driving to doctors apts and errands. Patient does have a renewed drivers licenses, but feels more comfortable riding with her daughter. Patient enjoys going out to eat and spending time with her grandchildren who live in [place]. Patient stated they come during  the summer to see her. Patient is excited her son, Vicente Males, is about to finish school to become a Theme park manager.    The patient has great family support system.      Family History:  The patient's family history includes Breast cancer in her sister; Cancer in her sister; Heart disease in her mother; Hypertension in  her father and sister.     ROS:   Please see the history of present illness.    ROS All other systems reviewed and are negative.   PHYSICAL EXAM:   VS:  BP (!) 126/42   Pulse 83   Ht 5\' 2"  (1.575 m)   Wt 163 lb (73.9 kg)   BMI 29.81 kg/m    GEN: Well nourished, well developed, in no acute distress HEENT: normal Neck: no JVD or masses Cardiac: RRR; soft murmur.. No rubs, or gallops. 1 + LE edema bilaterally Respiratory:  clear to auscultation bilaterally, normal work of breathing GI: soft, nontender, nondistended, + BS MS: no deformity or atrophy Skin: warm and dry, no rash Neuro:  Alert and Oriented x 3, Strength and sensation are intact Psych: euthymic mood, full affect   Wt Readings from Last 3 Encounters:  03/02/19 163 lb (73.9 kg)  03/02/19 164 lb (74.4 kg)  03/01/19 164 lb (74.4 kg)      Studies/Labs Reviewed:   EKG:  EKG is not ordered today.    Recent Labs: 12/09/2018: TSH 4.175 01/30/2019: ALT 12 02/21/2019: B Natriuretic Peptide 258.0 02/28/2019: BUN 30; Creatinine, Ser 1.34; Hemoglobin 10.7; Magnesium 2.0; Platelets 233; Potassium 3.6; Sodium 139   Lipid Panel    Component Value Date/Time   CHOL 127 12/09/2018 0716   CHOL 231 (H) 10/14/2016 0930   TRIG 44 12/09/2018 0716   HDL 55 12/09/2018 0716   HDL 52 10/14/2016 0930   CHOLHDL 2.3 12/09/2018 0716   VLDL 9 12/09/2018 0716   LDLCALC 63 12/09/2018 0716   LDLCALC 151 (H) 10/14/2016 0930    Additional studies/ records that were reviewed today include:  02/02/19 MITRAL VALVE REPAIR  Conclusion Successful transcatheter edge-to-edge mitral valve repair with a single MitraClip NTR placed A2/P2,  reducing MR from severe at baseline to 1-2+, LA V wave reduced by 50% from baseline  Recommendations  Antiplatelet/Anticoag Recommend uninterrupted dual antiplatelet therapy with Aspirin 81mg  daily and Clopidogrel 75mg  daily for 6 months.    _____________    Echo 02/03/19:  IMPRESSIONS 1. Left ventricular ejection fraction, by visual estimation, is 30 to 35%. The left ventricle has moderately decreased function. Left ventricular septal wall thickness was normal. There is mildly increased left ventricular hypertrophy. 2. Left ventricular diastolic function could not be evaluated pattern of LV diastolic filling. 3. Global right ventricle has normal systolic function.The right ventricular size is normal. No increase in right ventricular wall thickness. 4. Left atrial size was severely dilated. 5. Right atrial size was mildly dilated. 6. Trivial pericardial effusion is present. 7. The mitral valve has been repaired/replaced. Mild mitral valve regurgitation. Moderate mitral stenosis. 8. The tricuspid valve is normal in structure. Tricuspid valve regurgitation is mild. 9. The aortic valve is normal in structure. Aortic valve regurgitation was not visualized by color flow Doppler. 10. The pulmonic valve was not assessed. Pulmonic valve regurgitation was not assessed by color flow Doppler. 11. Aortic root could not be assessed. 12. Day 1 post MitraClip placement. Mitral regurgitation is mild. Transmitral gradients are elevated at 7 mmHg. LVEF decreased from prior the procedure from 50-55% to 30-35% with paradoxical septal motion. No pericardial effusion. 13. Iatrogenic left to right shunting.  _____________   03/02/19 IMPRESSIONS    1. Left ventricular ejection fraction, by visual estimation, is 30 to 35%. The left ventricle has moderately decreased function. There is no left ventricular hypertrophy.  2. There is akinesis of the inferoseptum and anteroseptum and global  diffuse  hypokinesis.  3. Left ventricular diastolic parameters are consistent with Grade II diastolic dysfunction (pseudonormalization).  4. Elevated left atrial and left ventricular end-diastolic pressures.  5. Global right ventricle has normal systolic function.The right ventricular size is normal. No increase in right ventricular wall thickness.  6. Left atrial size was severely dilated.  7. Right atrial size was normal.  8. S/P MitralClip placement. The mean transmitral valve gradient is 80mmHg. Mild to moderate mitral valve regurgitation.  9. The tricuspid valve is normal in structure. Tricuspid valve regurgitation is trivial. 10. The aortic valve is normal in structure. Aortic valve regurgitation is not visualized. No evidence of aortic valve sclerosis or stenosis. 11. The pulmonic valve was normal in structure. Pulmonic valve regurgitation is not visualized. 12. Normal pulmonary artery systolic pressure. 13. The inferior vena cava is normal in size with greater than 50% respiratory variability, suggesting right atrial pressure of 3 mmHg.   ASSESSMENT & PLAN:   Severe MR s/p MitraClip: she is now on only monotherapy with aspirin given recent GI bleed with aspirin and plavix. SBE prophylaxis discussed; I have RX'd amoxicillin. She has NYHA class I but has not been very active.  Echo today showed EF 30-35%, normally functioning MitraClip with mild mod-PVL and mild MS with a mean gradient 5 mm HG.    Chronic combined S/D CHF: appears euvolemic. Continue lasix 40mg  daily and Toprol XL 25mg  daily. Losartan discontinued during recent admission for AKI.  CKD stage III: had labs two days ago in the hospital and creat 1.34. II have asked [facility] to get repeat BMET next week given change in lasix dosing  Chronic anemia with hx of recent GI bleed: recent enteroscopy showed multiple angiodysplastic lesions with stigmata of recent bleeding s/p coagulation. Hg has remained stable and she is feeling much  better. Hg will be followed at [facility].    Medication Adjustments/Labs and Tests Ordered: Current medicines are reviewed at length with the patient today.  Concerns regarding medicines are outlined above.  Medication changes, Labs and Tests ordered today are listed in the Patient Instructions below. Patient Instructions  Medication Instructions:  1) Your provider discussed the importance of taking an antibiotic prior to all dental visits to prevent damage to the heart valves from infection. You were given a prescription for AMOXIL 2,000mg  to take one hour prior to any dental appointment.   Follow-Up: You have an appointment with Dr. Johnsie Cancel on 06/14/2019 at 10:00AM.  You will be called to arrange your 1 year Clip echo and office visit with Nell Range.     Signed, Angelena Form, PA-C  03/02/2019 3:58 PM    [facility] [place], [place], [place]  [address] Phone: [phone]; Fax: [phone]

## 2019-03-02 ENCOUNTER — Other Ambulatory Visit: Payer: Self-pay

## 2019-03-02 ENCOUNTER — Ambulatory Visit (INDEPENDENT_AMBULATORY_CARE_PROVIDER_SITE_OTHER): Payer: Medicare Other | Admitting: Physician Assistant

## 2019-03-02 ENCOUNTER — Non-Acute Institutional Stay (SKILLED_NURSING_FACILITY): Payer: Medicare Other | Admitting: Internal Medicine

## 2019-03-02 ENCOUNTER — Encounter: Payer: Self-pay | Admitting: Internal Medicine

## 2019-03-02 ENCOUNTER — Ambulatory Visit (HOSPITAL_COMMUNITY): Payer: Medicare Other | Attending: Physician Assistant

## 2019-03-02 VITALS — BP 126/42 | HR 83 | Ht 62.0 in | Wt 163.0 lb

## 2019-03-02 DIAGNOSIS — I5043 Acute on chronic combined systolic (congestive) and diastolic (congestive) heart failure: Secondary | ICD-10-CM

## 2019-03-02 DIAGNOSIS — N183 Chronic kidney disease, stage 3 unspecified: Secondary | ICD-10-CM | POA: Diagnosis not present

## 2019-03-02 DIAGNOSIS — Z954 Presence of other heart-valve replacement: Secondary | ICD-10-CM

## 2019-03-02 DIAGNOSIS — F0151 Vascular dementia with behavioral disturbance: Secondary | ICD-10-CM

## 2019-03-02 DIAGNOSIS — I340 Nonrheumatic mitral (valve) insufficiency: Secondary | ICD-10-CM | POA: Insufficient documentation

## 2019-03-02 DIAGNOSIS — Z9889 Other specified postprocedural states: Secondary | ICD-10-CM | POA: Insufficient documentation

## 2019-03-02 DIAGNOSIS — K922 Gastrointestinal hemorrhage, unspecified: Secondary | ICD-10-CM | POA: Diagnosis not present

## 2019-03-02 DIAGNOSIS — I509 Heart failure, unspecified: Secondary | ICD-10-CM | POA: Diagnosis not present

## 2019-03-02 DIAGNOSIS — I5042 Chronic combined systolic (congestive) and diastolic (congestive) heart failure: Secondary | ICD-10-CM | POA: Diagnosis not present

## 2019-03-02 DIAGNOSIS — N179 Acute kidney failure, unspecified: Secondary | ICD-10-CM | POA: Diagnosis not present

## 2019-03-02 DIAGNOSIS — N189 Chronic kidney disease, unspecified: Secondary | ICD-10-CM | POA: Insufficient documentation

## 2019-03-02 DIAGNOSIS — D649 Anemia, unspecified: Secondary | ICD-10-CM

## 2019-03-02 DIAGNOSIS — F01518 Vascular dementia, unspecified severity, with other behavioral disturbance: Secondary | ICD-10-CM

## 2019-03-02 DIAGNOSIS — I672 Cerebral atherosclerosis: Secondary | ICD-10-CM | POA: Diagnosis not present

## 2019-03-02 LAB — ECHOCARDIOGRAM COMPLETE
Height: 62 in
Weight: 2624 oz

## 2019-03-02 MED ORDER — AMOXICILLIN 500 MG PO TABS: ORAL_TABLET | ORAL | 11 refills | Status: DC

## 2019-03-02 NOTE — Telephone Encounter (Signed)
No response from pt regarding CR.  Closed referral.  

## 2019-03-02 NOTE — Patient Instructions (Signed)
See assessment and plan under each diagnosis in the problem list and acutely for this visit 

## 2019-03-02 NOTE — Assessment & Plan Note (Signed)
Clinically stable; no change in present regimen

## 2019-03-02 NOTE — Assessment & Plan Note (Signed)
Daily weights; maintenance furosemide 40 mg daily with additional dose for documented weight gain of 3-5 pounds.

## 2019-03-02 NOTE — Progress Notes (Signed)
NURSING HOME LOCATION:  [facility] ROOM NUMBER:  [id number]  CODE STATUS: Full code  PCP: Talbert Cage MD  This is a comprehensive admission note to [facility] [address] performed on this date less than 30 days from date of admission. Included are preadmission medical/surgical history; reconciled medication list; family history; social history and comprehensive review of systems.  Corrections and additions to the records were documented. Comprehensive physical exam was also performed. Additionally a clinical summary was entered for each active diagnosis pertinent to this admission in the Problem List to enhance continuity of care.  HPI: Patient was hospitalized 11/3-11/01/2019 for symptomatic anemia due to GI bleed.  The GI bleed was in the context of dual therapy with Plavix and aspirin following mitral valve clipping on 10/15.  FOBT was positive and hemoglobin 5.7.  GI consulted and IV Protonix was initiated.  She received 2 units of packed red cells with a rise in hemoglobin to 8.0.  The morning of 11/5 hemoglobin was 7.6 and she received 2 additional units of packed red cells resulting in a Hgb of 10.5.  Enteroscopy was performed 11/6 revealing normal esophagus, normal stomach, normal duodenum,.  Multiple angiodysplastic lesions with evidence of recent bleed were found in the proximal jejunum.  Coagulation was successful.  Maintenance PPI 40 mg daily was recommended.  For 3 days prior to discharge hemoglobin remained stable at 11. As the cardiac procedure was a mitral valve clip; decision was made to stop the Plavix and continue aspirin monotherapy. During her hospitalization losartan was held because of hypotension and AKI on CKD stage IIIb.  Lasix 60 mg twice daily was reduced to 40 mg daily.  Baseline creatinine was felt to be 1.3 but it increased up to 2 as of 11/9.  With the adjustment of the Lasix dose, creatinine dropped to 1.34.  Additional dose of 40 mg of Lasix was recommended  should she gain 3-5 pounds on daily weights. GI follow-up was to be approximately November 24.  Past medical and surgical history: Includes B12 deficiency, left bundle branch block, essential hypertension, dyslipidemia, GERD, elevated TSH, CKD, dementia, and chronic diastolic heart failure. Surgeries in addition to the mitral valve clipping 02/02/2019 include multiple GI endoscopy procedures.  Social history: Nondrinker, former smoker. She states that her daughter and her 2 children live with her.  She states that her daughter is working from home for [employer].  Family history: Extensive history reviewed.  Is noncontributory due to her age.   Review of systems:  Responses invalid due to dementia.  She cannot provide the date or the name of the president.  She stated that he was "a movie star".  Initially she said she was not sick and was only here to rest.  She stated that is why she went to the hospital.  She denied any surgeries or procedures while hospitalized.  The only positive review of system was occasional heartburn.  She denied any other GI symptoms.  Constitutional: No fever, significant weight change, fatigue  Eyes: No redness, discharge, pain, vision change ENT/mouth: No nasal congestion, purulent discharge, earache, change in hearing, sore throat  Cardiovascular: No chest pain, palpitations, paroxysmal nocturnal dyspnea, claudication, edema  Respiratory: No cough, sputum production, hemoptysis, DOE, significant snoring, apnea Gastrointestinal: No dysphagia, abdominal pain, nausea /vomiting, rectal bleeding, melena, change in bowels (she is unaware of serious GI bleed & its etiology) Genitourinary: No dysuria, hematuria, pyuria, incontinence, nocturia Musculoskeletal: No joint stiffness, joint swelling, weakness, pain Dermatologic: No rash, pruritus,  change in appearance of skin Neurologic: No dizziness, headache, syncope, seizures, numbness, tingling Psychiatric: No  significant anxiety, depression, insomnia, anorexia Endocrine: No change in hair/skin/nails, excessive thirst, excessive hunger, excessive urination  Hematologic/lymphatic: No significant bruising, lymphadenopathy, abnormal bleeding Allergy/immunology: No itchy/watery eyes, significant sneezing, urticaria, angioedema  Physical exam:  Pertinent or positive findings: Alert but not oriented as noted.  She has slight ptosis of the right eye.  There is minimal anisocoria with the left pupil greater than the right.  Heart sounds are distant and she has an occasional premature beat.  Posterior tibial pulses are stronger than the dorsalis pedis pulses.  She has an antiwandering bracelet at the left ankle.  She has isolated DIP changes of the hands.  General appearance: Adequately nourished; no acute distress, increased work of breathing is present.   Lymphatic: No lymphadenopathy about the head, neck, axilla. Eyes: No conjunctival inflammation or lid edema is present. There is no scleral icterus. Ears:  External ear exam shows no significant lesions or deformities.   Nose:  External nasal examination shows no deformity or inflammation. Nasal mucosa are pink and moist without lesions, exudates Oral exam: Lips and gums are healthy appearing.There is no oropharyngeal erythema or exudate. Neck:  No thyromegaly, masses, tenderness noted.    Heart:  No gallop, murmur, click, rub.  Lungs: Chest clear to auscultation without wheezes, rhonchi, rales, rubs. Abdomen: Bowel sounds are normal.  Abdomen is soft and nontender with no organomegaly, hernias, masses. GU: Deferred  Extremities:  No cyanosis, clubbing, edema. Neurologic exam:  Strength equal  in upper & lower extremities. Balance, Rhomberg, finger to nose testing could not be completed due to clinical state Skin: Warm & dry w/o tenting. No significant lesions or rash.  See clinical summary under each active problem in the Problem List with associated  updated therapeutic plan

## 2019-03-02 NOTE — Assessment & Plan Note (Signed)
She is unaware of the acute blood loss and upper endoscopy findings.  She complains only of occasional heartburn; PPI will be continued.

## 2019-03-02 NOTE — Progress Notes (Signed)
   NURSING HOME LOCATION:  [facility] ROOM NUMBER:312/[id number]    CODE STATUS:Full Code  PCP:  Jeb Levering. Chambliss MD   This is a comprehensive admission note to [facility] performed on this date less than 30 days from date of admission. Included are preadmission medical/surgical history; reconciled medication list; family history; social history and comprehensive review of systems.  Corrections and additions to the records were documented. Comprehensive physical exam was also performed. Additionally a clinical summary was entered for each active diagnosis pertinent to this admission in the Problem List to enhance continuity of care.  HPI:  Past medical and surgical history:  Social history:  Family history:   Review of systems:  Could not be completed due to dementia. Date given as Constitutional: No fever, significant weight change, fatigue  Eyes: No redness, discharge, pain, vision change ENT/mouth: No nasal congestion, purulent discharge, earache, change in hearing, sore throat  Cardiovascular: No chest pain, palpitations, paroxysmal nocturnal dyspnea, claudication, edema  Respiratory: No cough, sputum production, hemoptysis, DOE, significant snoring, apnea Gastrointestinal: No heartburn, dysphagia, abdominal pain, nausea /vomiting, rectal bleeding, melena, change in bowels Genitourinary: No dysuria, hematuria, pyuria, incontinence, nocturia Musculoskeletal: No joint stiffness, joint swelling, weakness, pain Dermatologic: No rash, pruritus, change in appearance of skin Neurologic: No dizziness, headache, syncope, seizures, numbness, tingling Psychiatric: No significant anxiety, depression, insomnia, anorexia Endocrine: No change in hair/skin/nails, excessive thirst, excessive hunger, excessive urination  Hematologic/lymphatic: No significant bruising, lymphadenopathy, abnormal bleeding Allergy/immunology: No itchy/watery eyes, significant sneezing, urticaria,  angioedema  Physical exam:  Pertinent or positive findings: General appearance: Adequately nourished; no acute distress, increased work of breathing is present.   Lymphatic: No lymphadenopathy about the head, neck, axilla. Eyes: No conjunctival inflammation or lid edema is present. There is no scleral icterus. Ears:  External ear exam shows no significant lesions or deformities.   Nose:  External nasal examination shows no deformity or inflammation. Nasal mucosa are pink and moist without lesions, exudates Oral exam: Lips and gums are healthy appearing.There is no oropharyngeal erythema or exudate. Neck:  No thyromegaly, masses, tenderness noted.    Heart:  Normal rate and regular rhythm. S1 and S2 normal without gallop, murmur, click, rub.  Lungs: Chest clear to auscultation without wheezes, rhonchi, rales, rubs. Abdomen: Bowel sounds are normal.  Abdomen is soft and nontender with no organomegaly, hernias, masses. GU: Deferred  Extremities:  No cyanosis, clubbing, edema. Neurologic exam:  Strength equal  in upper & lower extremities. Balance, Rhomberg, finger to nose testing could not be completed due to clinical state Deep tendon reflexes are equal Skin: Warm & dry w/o tenting. No significant lesions or rash.  See clinical summary under each active problem in the Problem List with associated updated therapeutic plan

## 2019-03-02 NOTE — Assessment & Plan Note (Addendum)
03/02/2019 patient was unable to provide the date or the name of the president.  She was able to name her PCP.  She can provide no history concerning her hospitalization and denied having any surgeries or procedures while hospitalized.  She stated she went to the hospital and was also admitted here only "to rest".  No behavioral issues present.

## 2019-03-02 NOTE — Patient Instructions (Addendum)
Medication Instructions:  1) Your provider discussed the importance of taking an antibiotic prior to all dental visits to prevent damage to the heart valves from infection. You were given a prescription for AMOXIL 2,000mg  to take one hour prior to any dental appointment.   Follow-Up: You have an appointment with Dr. Johnsie Cancel on 06/14/2019 at 10:00AM.  You will be called to arrange your 1 year Clip echo and office visit with Nell Range.

## 2019-03-08 ENCOUNTER — Non-Acute Institutional Stay (SKILLED_NURSING_FACILITY): Payer: Medicare Other | Admitting: Adult Health

## 2019-03-08 ENCOUNTER — Encounter: Payer: Self-pay | Admitting: Adult Health

## 2019-03-08 DIAGNOSIS — F0151 Vascular dementia with behavioral disturbance: Secondary | ICD-10-CM

## 2019-03-08 DIAGNOSIS — E538 Deficiency of other specified B group vitamins: Secondary | ICD-10-CM

## 2019-03-08 DIAGNOSIS — G4700 Insomnia, unspecified: Secondary | ICD-10-CM

## 2019-03-08 DIAGNOSIS — I5042 Chronic combined systolic (congestive) and diastolic (congestive) heart failure: Secondary | ICD-10-CM

## 2019-03-08 DIAGNOSIS — K922 Gastrointestinal hemorrhage, unspecified: Secondary | ICD-10-CM

## 2019-03-08 DIAGNOSIS — E785 Hyperlipidemia, unspecified: Secondary | ICD-10-CM

## 2019-03-08 DIAGNOSIS — Z9889 Other specified postprocedural states: Secondary | ICD-10-CM | POA: Diagnosis not present

## 2019-03-08 DIAGNOSIS — D62 Acute posthemorrhagic anemia: Secondary | ICD-10-CM

## 2019-03-08 DIAGNOSIS — I6789 Other cerebrovascular disease: Secondary | ICD-10-CM

## 2019-03-08 DIAGNOSIS — I672 Cerebral atherosclerosis: Secondary | ICD-10-CM

## 2019-03-08 DIAGNOSIS — F01518 Vascular dementia, unspecified severity, with other behavioral disturbance: Secondary | ICD-10-CM

## 2019-03-08 NOTE — Progress Notes (Addendum)
Location:  [place] Room Number: [place] of Service:  SNF (31) Provider:  [name], DNP, FNP-BC  Patient Care Team: [name], MD as PCP - General [name], MD as PCP - Cardiology (Cardiology) [name]  Extended Emergency Contact Information Primary Emergency Contact: [name] of [place] Phone: [phone] Mobile Phone: [phone] Relation: Daughter  Code Status:  Full Code  Goals of care: Advanced Directive information Advanced Directives 03/02/2019  Does Patient Have a Medical Advance Directive? Yes  Type of Advance Directive (No Data)  Does patient want to make changes to medical advance directive? No - Patient declined  Copy of Bureau in Chart? -  Would patient like information on creating a medical advance directive? -     Chief Complaint  Patient presents with  . Acute Visit    probable discharge   04/12/19 Addendum:  Patient was not discharged due to an illness in the family. Patient stayed at [place] until further notice from family.   HPI:  Pt is a 73 y.o. female seen today for a discharge visit. She will discharge home on 03/10/19 with Home health PT.  She has been admitted to [facility] on 02/28/2019 post hospitalization 02/21/2019 to 02/28/2019 due to anemia secondary to GI bleed.  She presented to the ED with weakness and shortness of breath x2 days.  She has PMH of anemia of chronic disease, anxiety, chronic kidney disease stage III, dementia, hyperlipidemia, hypertension, LBP, osteoarthritis S/P mitral valve repair and vitamin B12 deficiency.  On 02/02/2019, she was started on Plavix and aspirin after a mitral valve clip.  FOBT was positive and hemoglobin was 5.7.  GI was consulted.  She was made n.p.o., IV Protonix was started and transfused 2 units of packed RBCs with resulting hemoglobin of  8.0.  Her hemoglobin dropped to 7.6 on the morning of 11/5 and was transfused 2 additional units of packed RBCs with resultant hemoglobin of 10.5._On 10 6 showing normal esophagus, normal stomach, normal duodenum, however, multiple angiodysplastic lesions stigmata of recent bleeding were found in the proximal jejunum.  Coagulation was successful.  It was recommended to continue PPI 40 mg daily.  Her hemoglobin remained around 11 x 3 days prior to discharge.  Aspirin was restarted on 11/8 and Plavix was discontinued.  She will follow-up with GI, Dr. Collene Mares.  Patient was admitted to this facility for short-term rehabilitation after the patient's recent hospitalization.  Patient has completed SNF rehabilitation and therapy has cleared the patient for discharge.   Past Medical History:  Diagnosis Date  . Anemia   . Anemia of chronic disease   . Anxiety   . Arthritis    "hands" (05/09/2015)  . Chronic diastolic heart failure ([place])   . CKD (chronic kidney disease) stage 3, GFR 30-59 ml/min   . Dementia ([place])   . Elevated TSH 02/03/2018  . GERD (gastroesophageal reflux disease)   . HLD (hyperlipidemia)   . Hypertension   . LBBB (left bundle branch block)   . Mitral valve insufficiency   . Nonrheumatic mitral valve regurgitation 02/02/2019  . Osteoarthritis   . 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 S/P mitral valve repair    s/p MitraClip on 02/03/19  . Vitamin B12 deficiency 12/28/2018   Past Surgical History:  Procedure Laterality Date  . BIOPSY  10/28/2018   Procedure: BIOPSY;  Surgeon: Juanita Craver, MD;  Location: [facility];  Service:  Endoscopy;;  . CARDIAC CATHETERIZATION  05/2004  . COLONOSCOPY  07/2002   Archie Endo 09/02/2010  . COLONOSCOPY WITH PROPOFOL N/A 10/28/2018   Procedure: COLONOSCOPY WITH PROPOFOL;  Surgeon: Juanita Craver, MD;  Location: [facility] ENDOSCOPY;  Service: Endoscopy;  Laterality: N/A;  . ENTEROSCOPY N/A 02/24/2019   Procedure: ENTEROSCOPY;  Surgeon: Carol Ada, MD;  Location: [place];  Service: Endoscopy;  Laterality: N/A;  . ESOPHAGOGASTRODUODENOSCOPY  07/2002   w/biopsies/notes 09/02/2010  . ESOPHAGOGASTRODUODENOSCOPY (EGD) WITH PROPOFOL N/A 10/28/2018   Procedure: ESOPHAGOGASTRODUODENOSCOPY (EGD) WITH PROPOFOL;  Surgeon: Juanita Craver, MD;  Location: [facility] ENDOSCOPY;  Service: Endoscopy;  Laterality: N/A;  . GIVENS CAPSULE STUDY N/A 02/23/2019   Procedure: GIVENS CAPSULE STUDY;  Surgeon: Carol Ada, MD;  Location: [place];  Service: Endoscopy;  Laterality: N/A;  . HOT HEMOSTASIS N/A 10/28/2018   Procedure: HOT HEMOSTASIS (ARGON PLASMA COAGULATION/BICAP);  Surgeon: Juanita Craver, MD;  Location: [facility] ENDOSCOPY;  Service: Endoscopy;  Laterality: N/A;  . HOT HEMOSTASIS N/A 02/24/2019   Procedure: HOT HEMOSTASIS (ARGON PLASMA COAGULATION/BICAP);  Surgeon: Carol Ada, MD;  Location: [place];  Service: Endoscopy;  Laterality: N/A;  . IR ANGIOGRAM SELECTIVE EACH ADDITIONAL VESSEL  10/30/2018  . IR ANGIOGRAM SELECTIVE EACH ADDITIONAL VESSEL  10/30/2018  . IR ANGIOGRAM SELECTIVE EACH ADDITIONAL VESSEL  10/30/2018  . IR ANGIOGRAM SELECTIVE EACH ADDITIONAL VESSEL  10/30/2018  . IR ANGIOGRAM VISCERAL SELECTIVE  10/30/2018  . IR EMBO ART  VEN HEMORR LYMPH EXTRAV  INC GUIDE ROADMAPPING  10/30/2018  . IR US GUIDANCE  10/30/2018  . MITRAL VALVE REPAIR N/A 02/02/2019   Procedure: MITRAL VALVE REPAIR;  Surgeon: Sherren Mocha, MD;  Location: [place] CV LAB;  Service: Cardiovascular;  Laterality: N/A;  . POLYPECTOMY  10/28/2018   Procedure: POLYPECTOMY;  Surgeon: Juanita Craver, MD;  Location: [facility] ENDOSCOPY;  Service: Endoscopy;;  . RIGHT/LEFT HEART CATH AND CORONARY ANGIOGRAPHY N/A 12/29/2018   Procedure: RIGHT/LEFT HEART CATH AND CORONARY ANGIOGRAPHY;  Surgeon: Leonie Man, MD;  Location: [place] CV LAB;  Service: Cardiovascular;  Laterality: N/A;  . TEE WITHOUT CARDIOVERSION N/A 12/30/2018   Procedure: TRANSESOPHAGEAL ECHOCARDIOGRAM (TEE);  Surgeon: Lelon Perla,  MD;  Location: [facility] ENDOSCOPY;  Service: Cardiovascular;  Laterality: N/A;    Allergies  Allergen Reactions  . Plavix [Clopidogrel]     GI bleed from jejunal angiodysplastic lesions in context of Plavix and aspirin dual therapy    Outpatient Encounter Medications as of 03/08/2019  Medication Sig  . acetaminophen (TYLENOL) 325 MG tablet Take 650 mg by mouth every 6 (six) hours as needed for mild pain or headache.  Marland Kitchen amoxicillin (AMOXIL) 500 MG tablet Take 4 capsules (2,000 mg) one hour prior to all dental visits.  Marland Kitchen aspirin 81 MG chewable tablet Chew 1 tablet (81 mg total) by mouth daily.  Marland Kitchen atorvastatin (LIPITOR) 40 MG tablet Take 1 tablet (40 mg total) by mouth daily.  . cyanocobalamin 1000 MCG tablet Take 1 tablet (1,000 mcg total) by mouth daily.  . ferrous sulfate 324 MG TBEC Take 1 tablet (324 mg total) by mouth daily as needed.  . furosemide (LASIX) 40 MG tablet Take 1.5 tablets (60 mg total) by mouth daily.  . metoprolol succinate (TOPROL-XL) 25 MG 24 hr tablet Take 1 tablet (25 mg total) by mouth daily. Take with or immediately following a meal.  . NON FORMULARY Diet Regular  . pantoprazole (PROTONIX) 40 MG tablet Take 1 tablet (40 mg total) by mouth daily.  . ramelteon (ROZEREM) 8  MG tablet Take 1 tablet (8 mg total) by mouth at bedtime.  . [DISCONTINUED] aspirin 81 MG chewable tablet Chew 1 tablet (81 mg total) by mouth daily.  . [DISCONTINUED] atorvastatin (LIPITOR) 40 MG tablet Take 1 tablet (40 mg total) by mouth daily.  . [DISCONTINUED] cyanocobalamin 1000 MCG tablet Take 1 tablet (1,000 mcg total) by mouth daily.  . [DISCONTINUED] ferrous sulfate 324 MG TBEC Take 324 mg by mouth daily as needed.   . [DISCONTINUED] furosemide (LASIX) 40 MG tablet Take 60 mg by mouth daily.   . [DISCONTINUED] metoprolol succinate (TOPROL-XL) 25 MG 24 hr tablet Take 1 tablet (25 mg total) by mouth daily. Take with or immediately following a meal.  . [DISCONTINUED] pantoprazole (PROTONIX) 40 MG  tablet Take 1 tablet (40 mg total) by mouth daily.  . [DISCONTINUED] ramelteon (ROZEREM) 8 MG tablet Take 1 tablet (8 mg total) by mouth at bedtime.   No facility-administered encounter medications on file as of 03/08/2019.    Review of Systems  GENERAL: No change in appetite, no fatigue, no weight changes, no fever, chills or weakness MOUTH and THROAT: Denies oral discomfort, gingival pain or bleeding, pain from teeth or hoarseness   RESPIRATORY: no cough, SOB, DOE, wheezing, hemoptysis CARDIAC: No chest pain, edema or palpitations GI: No abdominal pain, diarrhea, constipation, heart burn, nausea or vomiting GU: Denies dysuria, frequency, hematuria, incontinence, or discharge NEUROLOGICAL: Denies dizziness, syncope, numbness, or headache PSYCHIATRIC: Denies feelings of depression or anxiety. No report of hallucinations, insomnia, paranoia, or agitation    Immunization History  Administered Date(s) Administered  . Influenza Split 01/21/2011, 02/01/2012, 02/09/2013  . Influenza Whole 02/08/2009, 02/03/2010  . Influenza,inj,Quad PF,6+ Mos 02/21/2014, 02/11/2015, 12/31/2015  . Influenza-Unspecified 01/19/2018  . Pneumococcal Conjugate-13 02/20/2015  . Pneumococcal Polysaccharide-23 04/01/2009  . Tdap 06/03/2011   Pertinent  Health Maintenance Due  Topic Date Due  . INFLUENZA VACCINE  11/19/2018  . COLONOSCOPY  10/27/2028  . DEXA SCAN  Completed  . PNA vac Low Risk Adult  Completed   Fall Risk  11/29/2018 09/26/2018 05/31/2018 02/02/2018 11/09/2017  Falls in the past year? 0 0 0 No No  Number falls in past yr: 0 - - - -     Vitals:   03/08/19 2216  BP: 130/69  Pulse: 80  Resp: 17  Temp: (!) 97.5 F (36.4 C)  Weight: 162 lb 3.2 oz (73.6 kg)  Height: 5\' 2"  (1.575 m)   Body mass index is 29.67 kg/m.  Physical Exam  GENERAL APPEARANCE: Well nourished. In no acute distress.  SKIN:  Skin is warm and dry.  MOUTH and THROAT: Lips are without lesions. Oral mucosa is moist and  without lesions. Tongue is normal in shape, size, and color and without lesions RESPIRATORY: Breathing is even & unlabored, BS CTAB CARDIAC: RRR, no murmur,no extra heart sounds, no edema GI: Abdomen soft, normal BS, no masses, no tenderness EXTREMITIES:  Able to move X 4 extremities NEUROLOGICAL: There is no tremor. Speech is clear. Alert to self, disoriented to time and place. PSYCHIATRIC:  Affect and behavior are appropriate  Labs reviewed: Recent Labs    02/26/19 0422 02/27/19 0517 02/28/19 0454  NA 141 141 139  K 3.4* 3.7 3.6  CL 102 102 102  CO2 27 26 26   GLUCOSE 114* 108* 111*  BUN 25* 33* 30*  CREATININE 1.50* 1.96* 1.34*  CALCIUM 8.9 8.5* 8.6*  MG 1.5* 1.6* 2.0  PHOS  --  5.0* 4.4   Recent Labs  12/10/18 0242 12/18/18 0230 01/30/19 1356 02/27/19 0517 02/28/19 0454  AST 14* 21 19  --   --   ALT 10 13 12   --   --   ALKPHOS 92 105 101  --   --   BILITOT 1.0 0.8 0.8  --   --   PROT 6.3* 6.1* 7.2  --   --   ALBUMIN 3.5 3.3* 3.8 3.2* 3.2*   Recent Labs    01/17/19 2359 01/30/19 1356 02/21/19 1530 02/22/19 0758 02/26/19 0422 02/27/19 0517 02/28/19 0454  WBC 9.5   < > 5.5 6.5 5.9 5.6 5.3  NEUTROABS 7.2  --  3.4 4.2  --   --   --   HGB 8.9*   < > 5.7* 8.0* 12.1 11.0* 10.7*  HCT 28.5*   < > 18.5* 24.8* 37.4 35.3* 34.2*  MCV 97.9   < > 101.1* 90.8 92.1 93.6 94.5  PLT 303   < > 365 300 265 244 233   < > = values in this interval not displayed.   Lab Results  Component Value Date   TSH 4.175 12/09/2018   Lab Results  Component Value Date   HGBA1C 4.4 (L) 01/30/2019   Lab Results  Component Value Date   CHOL 127 12/09/2018   HDL 55 12/09/2018   LDLCALC 63 12/09/2018   TRIG 44 12/09/2018   CHOLHDL 2.3 12/09/2018    Significant Diagnostic Results in last 30 days:  No results found.  Assessment/Plan  1. Gastric bleed - follow-up with GI on 03/14/19 - pantoprazole (PROTONIX) 40 MG tablet; Take 1 tablet (40 mg total) by mouth daily.  Dispense:  30 tablet; Refill: 0  2. Chronic combined systolic and diastolic congestive heart failure (HCC) - furosemide (LASIX) 40 MG tablet; Take 1.5 tablets (60 mg total) by mouth daily.  Dispense: 45 tablet; Refill: 0 - metoprolol succinate (TOPROL-XL) 25 MG 24 hr tablet; Take 1 tablet (25 mg total) by mouth daily. Take with or immediately following a meal.  Dispense: 30 tablet; Refill: 0  3. Anemia due to acute blood loss Lab Results  Component Value Date   HGB 10.7 (L) 02/28/2019   - ferrous sulfate 324 MG TBEC; Take 1 tablet (324 mg total) by mouth daily as needed.  Dispense: 30 tablet; Refill: 0  4. S/P mitral valve repair - aspirin 81 MG chewable tablet; Chew 1 tablet (81 mg total) by mouth daily.  Dispense: 30 tablet; Refill: 0  5. Vitamin B12 deficiency Lab Results  Component Value Date   VITAMINB12 173 (L) 12/28/2018   - cyanocobalamin 1000 MCG tablet; Take 1 tablet (1,000 mcg total) by mouth daily.  Dispense: 30 tablet; Refill: 0  6. Hyperlipidemia, unspecified hyperlipidemia type Lab Results  Component Value Date   CHOL 127 12/09/2018   HDL 55 12/09/2018   LDLCALC 63 12/09/2018   TRIG 44 12/09/2018   CHOLHDL 2.3 12/09/2018   - atorvastatin (LIPITOR) 40 MG tablet; Take 1 tablet (40 mg total) by mouth daily.  Dispense: 30 tablet; Refill: 0  7. Insomnia, unspecified type " - ramelteon (ROZEREM) 8 MG tablet; Take 1 tablet (8 mg total) by mouth at bedtime.  Dispense: 30 tablet; Refill: 0  8. Cerebral microvascular disease - atorvastatin (LIPITOR) 40 MG tablet; Take 1 tablet (40 mg total) by mouth daily.  Dispense: 30 tablet; Refill: 0  9. 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  Patient will have home health PT.  DME provided: 3-in-1  Total discharge  time: Greater than 30 minutes Greater than 50% was spent in counseling and coordination of care.  Discharge time involved coordination of the discharge process with social worker, nursing staff and therapy department. Medical justification for home health services/DME verified.   [name], DNP, FNP-BC [facility] [phone] (Monday-Friday 8:00 a.m. - 5:00 p.m.) [phone] (after hours)

## 2019-03-09 DIAGNOSIS — D62 Acute posthemorrhagic anemia: Secondary | ICD-10-CM | POA: Insufficient documentation

## 2019-03-09 DIAGNOSIS — E785 Hyperlipidemia, unspecified: Secondary | ICD-10-CM | POA: Insufficient documentation

## 2019-03-09 LAB — BASIC METABOLIC PANEL
BUN: 43 — AB (ref 4–21)
CO2: 29 — AB (ref 13–22)
Chloride: 100 (ref 99–108)
Creatinine: 1.3 — AB (ref 0.5–1.1)
Glucose: 110
Potassium: 3.3 — AB (ref 3.4–5.3)
Sodium: 141 (ref 137–147)

## 2019-03-09 LAB — COMPREHENSIVE METABOLIC PANEL
Calcium: 9.1 (ref 8.7–10.7)
GFR calc Af Amer: 47.69
GFR calc non Af Amer: 41.15

## 2019-03-09 LAB — CBC AND DIFFERENTIAL
HCT: 33 — AB (ref 36–46)
Hemoglobin: 10.9 — AB (ref 12.0–16.0)
WBC: 8.8

## 2019-03-09 LAB — CBC: RBC: 3.63 — AB (ref 3.87–5.11)

## 2019-03-09 MED ORDER — CYANOCOBALAMIN 1000 MCG PO TABS: 1000.0000 ug | ORAL_TABLET | Freq: Every day | ORAL | 0 refills | Status: DC

## 2019-03-09 MED ORDER — FERROUS SULFATE 324 MG PO TBEC: 324.0000 mg | DELAYED_RELEASE_TABLET | Freq: Every day | ORAL | 0 refills | Status: DC | PRN

## 2019-03-09 MED ORDER — METOPROLOL SUCCINATE ER 25 MG PO TB24: 25.0000 mg | ORAL_TABLET | Freq: Every day | ORAL | 0 refills | Status: DC

## 2019-03-09 MED ORDER — ATORVASTATIN CALCIUM 40 MG PO TABS: 40.0000 mg | ORAL_TABLET | Freq: Every day | ORAL | 0 refills | Status: DC

## 2019-03-09 MED ORDER — RAMELTEON 8 MG PO TABS: 8.0000 mg | ORAL_TABLET | Freq: Every day | ORAL | 0 refills | Status: DC

## 2019-03-09 MED ORDER — PANTOPRAZOLE SODIUM 40 MG PO TBEC: 40.0000 mg | DELAYED_RELEASE_TABLET | Freq: Every day | ORAL | 0 refills | Status: DC

## 2019-03-09 MED ORDER — ASPIRIN 81 MG PO CHEW: 81.0000 mg | CHEWABLE_TABLET | Freq: Every day | ORAL | 0 refills | Status: DC

## 2019-03-09 MED ORDER — FUROSEMIDE 40 MG PO TABS: 60.0000 mg | ORAL_TABLET | Freq: Every day | ORAL | 0 refills | Status: DC

## 2019-03-14 ENCOUNTER — Other Ambulatory Visit: Payer: Self-pay | Admitting: Family Medicine

## 2019-03-14 DIAGNOSIS — I5042 Chronic combined systolic (congestive) and diastolic (congestive) heart failure: Secondary | ICD-10-CM

## 2019-03-15 ENCOUNTER — Inpatient Hospital Stay: Payer: Medicare Other | Admitting: Family Medicine

## 2019-03-21 ENCOUNTER — Telehealth: Payer: Self-pay | Admitting: *Deleted

## 2019-03-21 NOTE — Telephone Encounter (Signed)
Hannah [name] calls to let us know that she has been unable to reach pt for med reconciliation since her discharge from [facility] on 03/20/19. Christen Bame, CMA

## 2019-03-22 DIAGNOSIS — D509 Iron deficiency anemia, unspecified: Secondary | ICD-10-CM | POA: Diagnosis not present

## 2019-03-22 DIAGNOSIS — K31811 Angiodysplasia of stomach and duodenum with bleeding: Secondary | ICD-10-CM | POA: Diagnosis not present

## 2019-03-27 ENCOUNTER — Other Ambulatory Visit: Payer: Self-pay | Admitting: Adult Health

## 2019-03-27 DIAGNOSIS — G4700 Insomnia, unspecified: Secondary | ICD-10-CM

## 2019-03-27 DIAGNOSIS — E538 Deficiency of other specified B group vitamins: Secondary | ICD-10-CM

## 2019-04-04 ENCOUNTER — Other Ambulatory Visit: Payer: Self-pay | Admitting: *Deleted

## 2019-04-04 DIAGNOSIS — I5042 Chronic combined systolic (congestive) and diastolic (congestive) heart failure: Secondary | ICD-10-CM

## 2019-04-05 ENCOUNTER — Other Ambulatory Visit: Payer: Self-pay | Admitting: Adult Health

## 2019-04-05 DIAGNOSIS — I5042 Chronic combined systolic (congestive) and diastolic (congestive) heart failure: Secondary | ICD-10-CM

## 2019-04-12 ENCOUNTER — Non-Acute Institutional Stay (SKILLED_NURSING_FACILITY): Payer: Medicare Other | Admitting: Adult Health

## 2019-04-12 ENCOUNTER — Encounter: Payer: Self-pay | Admitting: Adult Health

## 2019-04-12 DIAGNOSIS — D638 Anemia in other chronic diseases classified elsewhere: Secondary | ICD-10-CM

## 2019-04-12 DIAGNOSIS — I5042 Chronic combined systolic (congestive) and diastolic (congestive) heart failure: Secondary | ICD-10-CM

## 2019-04-12 DIAGNOSIS — Z9889 Other specified postprocedural states: Secondary | ICD-10-CM

## 2019-04-12 DIAGNOSIS — K922 Gastrointestinal hemorrhage, unspecified: Secondary | ICD-10-CM | POA: Diagnosis not present

## 2019-04-12 DIAGNOSIS — E785 Hyperlipidemia, unspecified: Secondary | ICD-10-CM

## 2019-04-12 DIAGNOSIS — G4700 Insomnia, unspecified: Secondary | ICD-10-CM

## 2019-04-12 DIAGNOSIS — I672 Cerebral atherosclerosis: Secondary | ICD-10-CM

## 2019-04-12 DIAGNOSIS — F01518 Vascular dementia, unspecified severity, with other behavioral disturbance: Secondary | ICD-10-CM

## 2019-04-12 DIAGNOSIS — F0151 Vascular dementia with behavioral disturbance: Secondary | ICD-10-CM

## 2019-04-12 NOTE — Progress Notes (Signed)
Location:  [place] Room Number: 101/B Place of Service:  SNF (31) Provider:  [name], DNP, FNP-BC  Patient Care Team: [name], MD as PCP - General [name], MD as PCP - Cardiology (Cardiology) [name]  Extended Emergency Contact Information Primary Emergency Contact: [name] of [place] Phone: [phone] Mobile Phone: [phone] Relation: Daughter  Code Status:  Full Code  Goals of care: Advanced Directive information Advanced Directives 04/12/2019  Does Patient Have a Medical Advance Directive? Yes  Type of Advance Directive (No Data)  Does patient want to make changes to medical advance directive? No - Patient declined  Copy of [place] in Chart? -  Would patient like information on creating a medical advance directive? -     Chief Complaint  Patient presents with  . Medical Management of Chronic Issues    Routine visit of medical management    HPI:  Pt is a 73 y.o. female seen today for medical management of chronic diseases. She has PMH of anemia of chronic disease, anxiety, chronic kidney disease stage III, dementia, hyperlipidemia, hypertension, osteoarthritis, S/P mitral valve repair and vitamin B12 deficiency. She was not discharged on 03/10/19 due to an illness in the family. Daughter decided for her to stay longer in the facility until the illness in the is resolved. No reported blood in stool. She has history of mitral valve repair and ASA 81 mg is the only anticoagulant. No edema nor SOB has been reported She takes Lasix 60 mg daily and Metoprolol Succinate ER 25 mg daily for CHF.  Past Medical History:  Diagnosis Date  . Anemia   . Anemia of chronic disease   . Anxiety   . Arthritis    "hands" (05/09/2015)  . Chronic diastolic heart failure ([place])   . CKD (chronic kidney disease) stage 3, GFR 30-59 ml/min   . Dementia ([place])   . Elevated TSH  02/03/2018  . GERD (gastroesophageal reflux disease)   . HLD (hyperlipidemia)   . Hypertension   . LBBB (left bundle branch block)   . Mitral valve insufficiency   . Nonrheumatic mitral valve regurgitation 02/02/2019  . Osteoarthritis   . 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 S/P mitral valve repair    s/p MitraClip on 02/03/19  . Vitamin B12 deficiency 12/28/2018   Past Surgical History:  Procedure Laterality Date  . BIOPSY  10/28/2018   Procedure: BIOPSY;  Surgeon: Juanita Craver, MD;  Location: [facility] ENDOSCOPY;  Service: Endoscopy;;  . CARDIAC CATHETERIZATION  05/2004  . COLONOSCOPY  07/2002   Archie Endo 09/02/2010  . COLONOSCOPY WITH PROPOFOL N/A 10/28/2018   Procedure: COLONOSCOPY WITH PROPOFOL;  Surgeon: Juanita Craver, MD;  Location: [facility] ENDOSCOPY;  Service: Endoscopy;  Laterality: N/A;  . ENTEROSCOPY N/A 02/24/2019   Procedure: ENTEROSCOPY;  Surgeon: Carol Ada, MD;  Location: [place];  Service: Endoscopy;  Laterality: N/A;  . ESOPHAGOGASTRODUODENOSCOPY  07/2002   w/biopsies/notes 09/02/2010  . ESOPHAGOGASTRODUODENOSCOPY (EGD) WITH PROPOFOL N/A 10/28/2018   Procedure: ESOPHAGOGASTRODUODENOSCOPY (EGD) WITH PROPOFOL;  Surgeon: Juanita Craver, MD;  Location: [facility] ENDOSCOPY;  Service: Endoscopy;  Laterality: N/A;  . GIVENS CAPSULE STUDY N/A 02/23/2019   Procedure: GIVENS CAPSULE STUDY;  Surgeon: Carol Ada, MD;  Location: [place];  Service: Endoscopy;  Laterality: N/A;  . HOT HEMOSTASIS N/A 10/28/2018   Procedure: HOT HEMOSTASIS (ARGON PLASMA COAGULATION/BICAP);  Surgeon: Juanita Craver, MD;  Location: [facility] ENDOSCOPY;  Service: Endoscopy;  Laterality: N/A;  . HOT HEMOSTASIS  N/A 02/24/2019   Procedure: HOT HEMOSTASIS (ARGON PLASMA COAGULATION/BICAP);  Surgeon: Carol Ada, MD;  Location: [place];  Service: Endoscopy;  Laterality: N/A;  . IR ANGIOGRAM SELECTIVE EACH ADDITIONAL VESSEL  10/30/2018  . IR ANGIOGRAM SELECTIVE EACH ADDITIONAL VESSEL  10/30/2018  . IR ANGIOGRAM SELECTIVE EACH ADDITIONAL  VESSEL  10/30/2018  . IR ANGIOGRAM SELECTIVE EACH ADDITIONAL VESSEL  10/30/2018  . IR ANGIOGRAM VISCERAL SELECTIVE  10/30/2018  . IR EMBO ART  VEN HEMORR LYMPH EXTRAV  INC GUIDE ROADMAPPING  10/30/2018  . IR US GUIDANCE  10/30/2018  . MITRAL VALVE REPAIR N/A 02/02/2019   Procedure: MITRAL VALVE REPAIR;  Surgeon: Sherren Mocha, MD;  Location: [place] CV LAB;  Service: Cardiovascular;  Laterality: N/A;  . POLYPECTOMY  10/28/2018   Procedure: POLYPECTOMY;  Surgeon: Juanita Craver, MD;  Location: [facility] ENDOSCOPY;  Service: Endoscopy;;  . RIGHT/LEFT HEART CATH AND CORONARY ANGIOGRAPHY N/A 12/29/2018   Procedure: RIGHT/LEFT HEART CATH AND CORONARY ANGIOGRAPHY;  Surgeon: Leonie Man, MD;  Location: [place] CV LAB;  Service: Cardiovascular;  Laterality: N/A;  . TEE WITHOUT CARDIOVERSION N/A 12/30/2018   Procedure: TRANSESOPHAGEAL ECHOCARDIOGRAM (TEE);  Surgeon: Lelon Perla, MD;  Location: [facility] ENDOSCOPY;  Service: Cardiovascular;  Laterality: N/A;    Allergies  Allergen Reactions  . Plavix [Clopidogrel]     GI bleed from jejunal angiodysplastic lesions in context of Plavix and aspirin dual therapy    Outpatient Encounter Medications as of 04/12/2019  Medication Sig  . acetaminophen (TYLENOL) 325 MG tablet Take 650 mg by mouth every 6 (six) hours as needed for mild pain or headache.  Marland Kitchen amoxicillin (AMOXIL) 500 MG tablet Take 4 capsules (2,000 mg) one hour prior to all dental visits.  Marland Kitchen aspirin 81 MG chewable tablet Chew 1 tablet (81 mg total) by mouth daily.  Marland Kitchen atorvastatin (LIPITOR) 40 MG tablet Take 1 tablet (40 mg total) by mouth daily.  . cyanocobalamin 1000 MCG tablet Take 1 tablet (1,000 mcg total) by mouth daily.  . ferrous sulfate 324 MG TBEC Take 1 tablet (324 mg total) by mouth daily as needed.  . furosemide (LASIX) 40 MG tablet Take 1.5 tablets (60 mg total) by mouth daily.  . metoprolol succinate (TOPROL-XL) 25 MG 24 hr tablet Take 1 tablet (25 mg total) by mouth daily. Take  with or immediately following a meal.  . NON FORMULARY Diet Regular  . NON FORMULARY Wander Guard Alert Device: Check placement every shift. (Nursing Measure) S/R: Alarms  . pantoprazole (PROTONIX) 40 MG tablet Take 1 tablet (40 mg total) by mouth daily.  . ramelteon (ROZEREM) 8 MG tablet Take 1 tablet (8 mg total) by mouth at bedtime.   No facility-administered encounter medications on file as of 04/12/2019.    Review of Systems  GENERAL: No change in appetite, no fatigue, no weight changes, no fever, chills or weakness MOUTH and THROAT: Denies oral discomfort, gingival pain or bleeding RESPIRATORY: no cough, SOB, DOE, wheezing, hemoptysis CARDIAC: No chest pain, edema or palpitations GI: No abdominal pain, diarrhea, constipation, heart burn, nausea or vomiting GU: Denies dysuria, frequency, hematuria or discharge NEUROLOGICAL: Denies dizziness, syncope, numbness, or headache PSYCHIATRIC: Denies feelings of depression or anxiety. No report of hallucinations, insomnia, paranoia, or agitation   Immunization History  Administered Date(s) Administered  . Influenza Split 01/21/2011, 02/01/2012, 02/09/2013  . Influenza Whole 02/08/2009, 02/03/2010  . Influenza,inj,Quad PF,6+ Mos 02/21/2014, 02/11/2015, 12/31/2015  . Influenza-Unspecified 01/19/2018, 01/19/2019  . Pneumococcal Conjugate-13 02/20/2015  . Pneumococcal Polysaccharide-23  04/01/2009  . Tdap 06/03/2011   Pertinent  Health Maintenance Due  Topic Date Due  . COLONOSCOPY  10/27/2028  . INFLUENZA VACCINE  Completed  . DEXA SCAN  Completed  . PNA vac Low Risk Adult  Completed   Fall Risk  11/29/2018 09/26/2018 05/31/2018 02/02/2018 11/09/2017  Falls in the past year? 0 0 0 No No  Number falls in past yr: 0 - - - -     Vitals:   04/12/19 1057  BP: (!) 120/59  Pulse: 90  Resp: 18  Temp: 98.9 F (37.2 C)  TempSrc: Oral  SpO2: 98%  Weight: 163 lb 9.6 oz (74.2 kg)  Height: 5\' 2"  (1.575 m)   Body mass index is 29.92  kg/m.  Physical Exam  GENERAL APPEARANCE: Well nourished. In no acute distress. Obese SKIN:  Skin is warm and dry.  MOUTH and THROAT: Lips are without lesions. Oral mucosa is moist and without lesions. Tongue is normal in shape, size, and color and without lesions RESPIRATORY: Breathing is even & unlabored, BS CTAB CARDIAC: RRR, no murmur,no extra heart sounds, no edema GI: Abdomen soft, normal BS, no masses, no tenderness EXTREMITIES:  Able to move X 4 extremities NEUROLOGICAL: There is no tremor. Speech is clear. Alert to self, disoriented to time and place. PSYCHIATRIC:  Affect and behavior are appropriate  Labs reviewed: Recent Labs    02/26/19 0422 02/27/19 0517 02/28/19 0454  NA 141 141 139  K 3.4* 3.7 3.6  CL 102 102 102  CO2 27 26 26   GLUCOSE 114* 108* 111*  BUN 25* 33* 30*  CREATININE 1.50* 1.96* 1.34*  CALCIUM 8.9 8.5* 8.6*  MG 1.5* 1.6* 2.0  PHOS  --  5.0* 4.4   Recent Labs    12/10/18 0242 12/18/18 0230 01/30/19 1356 02/27/19 0517 02/28/19 0454  AST 14* 21 19  --   --   ALT 10 13 12   --   --   ALKPHOS 92 105 101  --   --   BILITOT 1.0 0.8 0.8  --   --   PROT 6.3* 6.1* 7.2  --   --   ALBUMIN 3.5 3.3* 3.8 3.2* 3.2*   Recent Labs    01/17/19 2359 01/30/19 1356 02/21/19 1530 02/22/19 0758 02/26/19 0422 02/27/19 0517 02/28/19 0454  WBC 9.5   < > 5.5 6.5 5.9 5.6 5.3  NEUTROABS 7.2  --  3.4 4.2  --   --   --   HGB 8.9*   < > 5.7* 8.0* 12.1 11.0* 10.7*  HCT 28.5*   < > 18.5* 24.8* 37.4 35.3* 34.2*  MCV 97.9   < > 101.1* 90.8 92.1 93.6 94.5  PLT 303   < > 365 300 265 244 233   < > = values in this interval not displayed.   Lab Results  Component Value Date   TSH 4.175 12/09/2018   Lab Results  Component Value Date   HGBA1C 4.4 (L) 01/30/2019   Lab Results  Component Value Date   CHOL 127 12/09/2018   HDL 55 12/09/2018   LDLCALC 63 12/09/2018   TRIG 44 12/09/2018   CHOLHDL 2.3 12/09/2018     Assessment/Plan  1. Gastrointestinal  hemorrhage, unspecified gastrointestinal hemorrhage type - no reported blood in stool, continue pantoprazole  2. Chronic combined systolic and diastolic congestive heart failure (HCC) - no SOB nor edema, continue Lasix and metoprolol succinate ER  3. Anemia of chronic disease Lab Results  Component Value  Date   HGB 10.7 (L) 02/28/2019  -Stable, continue ferrous sulfate  4. S/P mitral valve repair -Continue aspirin 81 mg daily  5. Hyperlipidemia, unspecified hyperlipidemia type Lab Results  Component Value Date   CHOL 127 12/09/2018   HDL 55 12/09/2018   LDLCALC 63 12/09/2018   TRIG 44 12/09/2018   CHOLHDL 2.3 12/09/2018  -Continue atorvastatin  6. Insomnia, unspecified type -Stable, continue ramelteon  7. Dementia due to atherosclerosis with behavioral disturbance ([place]) -Continue supportive care and fall precautions   Family/ staff Communication:  Discussed plan of care with resident and charge nurse.  Labs/tests ordered:  None  Goals of care:   Short-term care   [name], DNP, FNP-BC [facility] and Adult Medicine [phone] (Monday-Friday 8:00 a.m. - 5:00 p.m.) [phone] (after hours)

## 2019-04-12 NOTE — Addendum Note (Signed)
Addended by: [name] C on: 04/12/2019 10:05 AM   Modules accepted: Level of Service

## 2019-04-17 ENCOUNTER — Encounter: Payer: Self-pay | Admitting: Adult Health

## 2019-04-17 ENCOUNTER — Non-Acute Institutional Stay (SKILLED_NURSING_FACILITY): Payer: Medicare Other | Admitting: Adult Health

## 2019-04-17 DIAGNOSIS — E538 Deficiency of other specified B group vitamins: Secondary | ICD-10-CM

## 2019-04-17 DIAGNOSIS — K922 Gastrointestinal hemorrhage, unspecified: Secondary | ICD-10-CM | POA: Diagnosis not present

## 2019-04-17 DIAGNOSIS — E785 Hyperlipidemia, unspecified: Secondary | ICD-10-CM

## 2019-04-17 DIAGNOSIS — U071 COVID-19: Secondary | ICD-10-CM | POA: Diagnosis not present

## 2019-04-17 DIAGNOSIS — I5042 Chronic combined systolic (congestive) and diastolic (congestive) heart failure: Secondary | ICD-10-CM | POA: Diagnosis not present

## 2019-04-17 DIAGNOSIS — Z9889 Other specified postprocedural states: Secondary | ICD-10-CM

## 2019-04-17 DIAGNOSIS — D62 Acute posthemorrhagic anemia: Secondary | ICD-10-CM

## 2019-04-17 DIAGNOSIS — G4700 Insomnia, unspecified: Secondary | ICD-10-CM | POA: Diagnosis not present

## 2019-04-17 NOTE — Progress Notes (Addendum)
Location:  [facility] Room Number: 101/B Place of Service:  SNF (31) Provider:  [name], DNP, FNP-BC  Patient Care Team: [name], MD as PCP - General [name], MD as PCP - Cardiology (Cardiology) [name]  Extended Emergency Contact Information Primary Emergency Contact: [name] of [place] Phone: [phone] Mobile Phone: [phone] Relation: Daughter  Code Status:  Full Code  Goals of care: Advanced Directive information Advanced Directives 05/12/2019  Does Patient Have a Medical Advance Directive? Yes  Type of Advance Directive (No Data)  Does patient want to make changes to medical advance directive? No - Patient declined  Copy of [place] in Chart? -  Would patient like information on creating a medical advance directive? -     Chief Complaint  Patient presents with  . Acute Visit    Possible discharge    HPI:   05/15/19 Addendum:  Again, she was not discharged. Daughter was supposed to pick her up from facility to her home but did not show up. Social worker will be contacting daughter.  Pt is a 73 y.o. female seen today for a discharge visit. She will discharge home with Home health PT.  She was supposed to be discharged last month but was cancelled due to an illness in the family. She was admitted to [facility] on 02/28/19 post hospitalization 02/21/19 to 02/28/19 due to GI bleed resulting in anemia. On 02/02/19, she was started on Plavix and aspirin after a mitral valve clip.  FOBT was positive and hemoglobin was 5.7 when she presented to ED with weakness and shortness of breath x2 days.  GI was consulted.  IV Protonix was started and was transfused 2 units packed RBCs with resulting hemoglobin of 8.0.  Her hemoglobin dropped to 7.6 on the morning of 11/5 and was transfused 2 additional units of packed RBCs with resultant hemoglobin  of 10.5.  Enteroscopy was performed on 11/6 showing normal esophagus, normal stomach, normal duodenum, however, multiple angiodysplastic lesions.  Stigmata of recent bleeding were found in the proximal jejunum.  It was recommended for her to continue PPI 40 mg daily.  Aspirin was restarted on 11/8 and Plavix was discontinued.  GI, Dr. Collene Mares, follow-up was recommended.  Patient was admitted to this facility for short-term rehabilitation after the patient's recent hospitalization.  Patient has completed SNF rehabilitation and therapy has cleared the patient for discharge.   Past Medical History:  Diagnosis Date  . Anemia   . Anemia of chronic disease   . Anxiety   . Arthritis    "hands" (05/09/2015)  . Chronic diastolic heart failure ([place])   . CKD (chronic kidney disease) stage 3, GFR 30-59 ml/min   . Dementia ([place])   . Elevated TSH 02/03/2018  . GERD (gastroesophageal reflux disease)   . HLD (hyperlipidemia)   . Hypertension   . LBBB (left bundle branch block)   . Mitral valve insufficiency   . Nonrheumatic mitral valve regurgitation 02/02/2019  . Osteoarthritis   . 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 S/P mitral valve repair    s/p MitraClip on 02/03/19  . Vitamin B12 deficiency 12/28/2018   Past Surgical History:  Procedure Laterality Date  . BIOPSY  10/28/2018   Procedure: BIOPSY;  Surgeon: Juanita Craver, MD;  Location: [facility] ENDOSCOPY;  Service: Endoscopy;;  . CARDIAC CATHETERIZATION  05/2004  . COLONOSCOPY  07/2002   Archie Endo 09/02/2010  . COLONOSCOPY WITH PROPOFOL N/A 10/28/2018  Procedure: COLONOSCOPY WITH PROPOFOL;  Surgeon: Juanita Craver, MD;  Location: [facility] ENDOSCOPY;  Service: Endoscopy;  Laterality: N/A;  . ENTEROSCOPY N/A 02/24/2019   Procedure: ENTEROSCOPY;  Surgeon: Carol Ada, MD;  Location: [facility];  Service: Endoscopy;  Laterality: N/A;  . ESOPHAGOGASTRODUODENOSCOPY  07/2002   w/biopsies/notes 09/02/2010  . ESOPHAGOGASTRODUODENOSCOPY (EGD) WITH PROPOFOL N/A 10/28/2018    Procedure: ESOPHAGOGASTRODUODENOSCOPY (EGD) WITH PROPOFOL;  Surgeon: Juanita Craver, MD;  Location: [facility] ENDOSCOPY;  Service: Endoscopy;  Laterality: N/A;  . GIVENS CAPSULE STUDY N/A 02/23/2019   Procedure: GIVENS CAPSULE STUDY;  Surgeon: Carol Ada, MD;  Location: [facility];  Service: Endoscopy;  Laterality: N/A;  . HOT HEMOSTASIS N/A 10/28/2018   Procedure: HOT HEMOSTASIS (ARGON PLASMA COAGULATION/BICAP);  Surgeon: Juanita Craver, MD;  Location: [facility] ENDOSCOPY;  Service: Endoscopy;  Laterality: N/A;  . HOT HEMOSTASIS N/A 02/24/2019   Procedure: HOT HEMOSTASIS (ARGON PLASMA COAGULATION/BICAP);  Surgeon: Carol Ada, MD;  Location: [facility];  Service: Endoscopy;  Laterality: N/A;  . IR ANGIOGRAM SELECTIVE EACH ADDITIONAL VESSEL  10/30/2018  . IR ANGIOGRAM SELECTIVE EACH ADDITIONAL VESSEL  10/30/2018  . IR ANGIOGRAM SELECTIVE EACH ADDITIONAL VESSEL  10/30/2018  . IR ANGIOGRAM SELECTIVE EACH ADDITIONAL VESSEL  10/30/2018  . IR ANGIOGRAM VISCERAL SELECTIVE  10/30/2018  . IR EMBO ART  VEN HEMORR LYMPH EXTRAV  INC GUIDE ROADMAPPING  10/30/2018  . IR US GUIDANCE  10/30/2018  . MITRAL VALVE REPAIR N/A 02/02/2019   Procedure: MITRAL VALVE REPAIR;  Surgeon: Sherren Mocha, MD;  Location: [facility] CV LAB;  Service: Cardiovascular;  Laterality: N/A;  . POLYPECTOMY  10/28/2018   Procedure: POLYPECTOMY;  Surgeon: Juanita Craver, MD;  Location: [facility] ENDOSCOPY;  Service: Endoscopy;;  . RIGHT/LEFT HEART CATH AND CORONARY ANGIOGRAPHY N/A 12/29/2018   Procedure: RIGHT/LEFT HEART CATH AND CORONARY ANGIOGRAPHY;  Surgeon: Leonie Man, MD;  Location: [facility] CV LAB;  Service: Cardiovascular;  Laterality: N/A;  . TEE WITHOUT CARDIOVERSION N/A 12/30/2018   Procedure: TRANSESOPHAGEAL ECHOCARDIOGRAM (TEE);  Surgeon: Lelon Perla, MD;  Location: [facility] ENDOSCOPY;  Service: Cardiovascular;  Laterality: N/A;    Allergies  Allergen Reactions  . Plavix [Clopidogrel]     GI bleed from jejunal angiodysplastic lesions in  context of Plavix and aspirin dual therapy    Outpatient Encounter Medications as of 04/17/2019  Medication Sig  . acetaminophen (TYLENOL) 325 MG tablet Take 650 mg by mouth every 6 (six) hours as needed for mild pain or headache.  Marland Kitchen aspirin 81 MG chewable tablet Chew 1 tablet (81 mg total) by mouth daily.  Marland Kitchen atorvastatin (LIPITOR) 40 MG tablet Take 1 tablet (40 mg total) by mouth daily.  . cyanocobalamin 1000 MCG tablet Take 1 tablet (1,000 mcg total) by mouth daily.  . ferrous sulfate 324 MG TBEC Take 1 tablet (324 mg total) by mouth daily.  . furosemide (LASIX) 40 MG tablet Take 1.5 tablets (60 mg total) by mouth daily.  . metoprolol succinate (TOPROL-XL) 25 MG 24 hr tablet Take 1 tablet (25 mg total) by mouth daily. Take with or immediately following a meal.  . NON FORMULARY Diet Regular  . NON FORMULARY Wander Guard Alert Device: Check placement every shift. (Nursing Measure) S/R: Alarms  . pantoprazole (PROTONIX) 40 MG tablet Take 1 tablet (40 mg total) by mouth daily.  . ramelteon (ROZEREM) 8 MG tablet Take 1 tablet (8 mg total) by mouth at bedtime.  . [DISCONTINUED] aspirin 81 MG chewable tablet Chew 1 tablet (81 mg total) by mouth daily.  . [  DISCONTINUED] atorvastatin (LIPITOR) 40 MG tablet Take 1 tablet (40 mg total) by mouth daily.  . [DISCONTINUED] cyanocobalamin 1000 MCG tablet Take 1 tablet (1,000 mcg total) by mouth daily.  . [DISCONTINUED] ferrous sulfate 324 MG TBEC Take 1 tablet (324 mg total) by mouth daily as needed.  . [DISCONTINUED] metoprolol succinate (TOPROL-XL) 25 MG 24 hr tablet Take 1 tablet (25 mg total) by mouth daily. Take with or immediately following a meal.  . [DISCONTINUED] pantoprazole (PROTONIX) 40 MG tablet Take 1 tablet (40 mg total) by mouth daily.  . [DISCONTINUED] ramelteon (ROZEREM) 8 MG tablet Take 1 tablet (8 mg total) by mouth at bedtime.  . furosemide (LASIX) 40 MG tablet Give 1.5 tabs = 60 mg orally daily as needed for weight gain  .  [DISCONTINUED] amoxicillin (AMOXIL) 500 MG tablet Take 4 capsules (2,000 mg) one hour prior to all dental visits.  . [DISCONTINUED] furosemide (LASIX) 40 MG tablet Take 1.5 tablets (60 mg total) by mouth daily.   No facility-administered encounter medications on file as of 04/17/2019.    Review of Systems  GENERAL: No change in appetite, no fatigue, no weight changes, no fever, chills or weakness MOUTH and THROAT: Denies oral discomfort, gingival pain or bleeding RESPIRATORY: no cough, SOB, DOE, wheezing, hemoptysis CARDIAC: No chest pain, edema or palpitations GI: No abdominal pain, diarrhea, constipation, heart burn, nausea or vomiting GU: Denies dysuria, frequency, hematuria, incontinence, or discharge NEUROLOGICAL: Denies dizziness, syncope, numbness, or headache PSYCHIATRIC: Denies feelings of depression or anxiety. No report of hallucinations, insomnia, paranoia, or agitation   Immunization History  Administered Date(s) Administered  . Influenza Split 01/21/2011, 02/01/2012, 02/09/2013  . Influenza Whole 02/08/2009, 02/03/2010  . Influenza, High Dose Seasonal PF 01/19/2018  . Influenza,inj,Quad PF,6+ Mos 02/21/2014, 02/11/2015, 12/31/2015  . Influenza-Unspecified 01/19/2018, 01/19/2019  . Moderna SARS-COVID-2 Vaccination 05/08/2019  . Pneumococcal Conjugate-13 02/20/2015  . Pneumococcal Polysaccharide-23 04/01/2009  . Tdap 06/03/2011   Pertinent  Health Maintenance Due  Topic Date Due  . COLONOSCOPY  10/27/2028  . INFLUENZA VACCINE  Completed  . DEXA SCAN  Completed  . PNA vac Low Risk Adult  Completed   Fall Risk  11/29/2018 09/26/2018 05/31/2018 02/02/2018 11/09/2017  Falls in the past year? 0 0 0 No No  Number falls in past yr: 0 - - - -     Vitals:   04/17/19 0832  BP: (!) 106/52  Pulse: 82  Resp: 18  Temp: 98.3 F (36.8 C)  TempSrc: Oral  SpO2: 98%  Weight: 165 lb 9.6 oz (75.1 kg)  Height: 5\' 2"  (1.575 m)   Body mass index is 30.29 kg/m.  Physical  Exam  GENERAL APPEARANCE: Well nourished. In no acute distress. Obese SKIN:  Skin is warm and dry.  MOUTH and THROAT: Lips are without lesions. Oral mucosa is moist and without lesions. Tongue is normal in shape, size, and color and without lesions RESPIRATORY: Breathing is even & unlabored, BS CTAB CARDIAC: RRR, no murmur,no extra heart sounds, no edema GI: Abdomen soft, normal BS, no masses, no tenderness EXTREMITIES:  Able to move X 4 extremities NEUROLOGICAL: There is no tremor. Speech is clear. Alert to self, disoriented to time and place. PSYCHIATRIC:  Affect and behavior are appropriate  Labs reviewed: Recent Labs    02/26/19 0422 02/26/19 0422 02/27/19 0517 02/28/19 0454 03/09/19 0000  NA 141   < > 141 139 141  K 3.4*   < > 3.7 3.6 3.3*  CL 102   < >  102 102 100  CO2 27   < > 26 26 29*  GLUCOSE 114*  --  108* 111*  --   BUN 25*   < > 33* 30* 43*  CREATININE 1.50*   < > 1.96* 1.34* 1.3*  CALCIUM 8.9   < > 8.5* 8.6* 9.1  MG 1.5*  --  1.6* 2.0  --   PHOS  --   --  5.0* 4.4  --    < > = values in this interval not displayed.   Recent Labs    12/10/18 0242 12/10/18 0242 12/18/18 0230 12/18/18 0230 01/30/19 1356 02/27/19 0517 02/28/19 0454  AST 14*  --  21  --  19  --   --   ALT 10  --  13  --  12  --   --   ALKPHOS 92  --  105  --  101  --   --   BILITOT 1.0  --  0.8  --  0.8  --   --   PROT 6.3*  --  6.1*  --  7.2  --   --   ALBUMIN 3.5   < > 3.3*   < > 3.8 3.2* 3.2*   < > = values in this interval not displayed.   Recent Labs    01/17/19 2359 01/30/19 1356 02/21/19 1530 02/21/19 1530 02/22/19 0758 02/23/19 0408 02/26/19 0422 02/26/19 0422 02/27/19 0517 02/28/19 0454 03/09/19 0000  WBC 9.5   < > 5.5   < > 6.5   < > 5.9   < > 5.6 5.3 8.8  NEUTROABS 7.2  --  3.4  --  4.2  --   --   --   --   --   --   HGB 8.9*   < > 5.7*   < > 8.0*   < > 12.1   < > 11.0* 10.7* 10.9*  HCT 28.5*   < > 18.5*   < > 24.8*   < > 37.4   < > 35.3* 34.2* 33*  MCV 97.9   < >  101.1*   < > 90.8   < > 92.1  --  93.6 94.5  --   PLT 303   < > 365   < > 300   < > 265  --  244 233  --    < > = values in this interval not displayed.   Lab Results  Component Value Date   TSH 4.175 12/09/2018   Lab Results  Component Value Date   HGBA1C 4.4 (L) 01/30/2019   Lab Results  Component Value Date   CHOL 127 12/09/2018   HDL 55 12/09/2018   LDLCALC 63 12/09/2018   TRIG 44 12/09/2018   CHOLHDL 2.3 12/09/2018      Assessment/Plan  1. Gastric bleed - pantoprazole (PROTONIX) 40 MG tablet; Take 1 tablet (40 mg total) by mouth daily.  Dispense: 30 tablet; Refill: 0  2. Chronic combined systolic and diastolic congestive heart failure (HCC) - furosemide (LASIX) 40 MG tablet; Take 1.5 tablets (60 mg total) by mouth daily.  Dispense: 45 tablet; Refill: 0 - furosemide (LASIX) 40 MG tablet; Give 1.5 tabs = 60 mg orally daily as needed for weight gain  Dispense: 45 tablet; Refill: 0 - metoprolol succinate (TOPROL-XL) 25 MG 24 hr tablet; Take 1 tablet (25 mg total) by mouth daily. Take with or immediately following a meal.  Dispense: 30 tablet; Refill: 0  3. Anemia  due to acute blood loss Lab Results  Component Value Date   HGB 10.9 (A) 03/09/2019  -Continue ferrous sulfate 324 mg 1 tab daily  4. Insomnia, unspecified type - ramelteon (ROZEREM) 8 MG tablet; Take 1 tablet (8 mg total) by mouth at bedtime.  Dispense: 30 tablet; Refill: 0  5. S/P mitral valve repair - aspirin 81 MG chewable tablet; Chew 1 tablet (81 mg total) by mouth daily.  Dispense: 30 tablet; Refill: 0  6. Vitamin B12 deficiency - cyanocobalamin 1000 MCG tablet; Take 1 tablet (1,000 mcg total) by mouth daily.  Dispense: 30 tablet; Refill: 0  7. Hyperlipidemia, unspecified hyperlipidemia type - atorvastatin (LIPITOR) 40 MG tablet; Take 1 tablet (40 mg total) by mouth daily.  Dispense: 30 tablet; Refill: 0     I have filled out patient's discharge paperwork and e-prescribed medications.  Patient  will receive home health PT.  DME provided: None  Total discharge time: Greater than 30 minutes  Greater tham 50% was spent in  Counseling and coordination of care.   Discharge time involved coordination of the discharge process with social worker, nursing staff and therapy department. Medical justification for home health services verified.     [name], DNP, FNP-BC [facility] [phone] (Monday-Friday 8:00 a.m. - 5:00 p.m.) [phone] (after hours)

## 2019-04-18 MED ORDER — PANTOPRAZOLE SODIUM 40 MG PO TBEC: 40.0000 mg | DELAYED_RELEASE_TABLET | Freq: Every day | ORAL | 0 refills | Status: DC

## 2019-04-18 MED ORDER — FUROSEMIDE 40 MG PO TABS: 60.0000 mg | ORAL_TABLET | Freq: Every day | ORAL | 0 refills | Status: DC

## 2019-04-18 MED ORDER — FUROSEMIDE 40 MG PO TABS: ORAL_TABLET | ORAL | 0 refills | Status: DC

## 2019-04-18 MED ORDER — METOPROLOL SUCCINATE ER 25 MG PO TB24: 25.0000 mg | ORAL_TABLET | Freq: Every day | ORAL | 0 refills | Status: DC

## 2019-04-18 MED ORDER — ASPIRIN 81 MG PO CHEW: 81.0000 mg | CHEWABLE_TABLET | Freq: Every day | ORAL | 0 refills | Status: DC

## 2019-04-18 MED ORDER — FERROUS SULFATE 324 MG PO TBEC: 324.0000 mg | DELAYED_RELEASE_TABLET | Freq: Every day | ORAL | 0 refills | Status: DC

## 2019-04-18 MED ORDER — ATORVASTATIN CALCIUM 40 MG PO TABS: 40.0000 mg | ORAL_TABLET | Freq: Every day | ORAL | 0 refills | Status: DC

## 2019-04-18 MED ORDER — CYANOCOBALAMIN 1000 MCG PO TABS: 1000.0000 ug | ORAL_TABLET | Freq: Every day | ORAL | 0 refills | Status: DC

## 2019-04-18 MED ORDER — RAMELTEON 8 MG PO TABS: 8.0000 mg | ORAL_TABLET | Freq: Every day | ORAL | 0 refills | Status: DC

## 2019-05-10 DIAGNOSIS — J069 Acute upper respiratory infection, unspecified: Secondary | ICD-10-CM | POA: Diagnosis not present

## 2019-05-12 ENCOUNTER — Non-Acute Institutional Stay (SKILLED_NURSING_FACILITY): Payer: Medicare Other | Admitting: Adult Health

## 2019-05-12 ENCOUNTER — Encounter: Payer: Self-pay | Admitting: Adult Health

## 2019-05-12 DIAGNOSIS — D62 Acute posthemorrhagic anemia: Secondary | ICD-10-CM | POA: Diagnosis not present

## 2019-05-12 DIAGNOSIS — F0390 Unspecified dementia without behavioral disturbance: Secondary | ICD-10-CM

## 2019-05-12 DIAGNOSIS — E785 Hyperlipidemia, unspecified: Secondary | ICD-10-CM

## 2019-05-12 DIAGNOSIS — G4700 Insomnia, unspecified: Secondary | ICD-10-CM | POA: Diagnosis not present

## 2019-05-12 DIAGNOSIS — I5042 Chronic combined systolic (congestive) and diastolic (congestive) heart failure: Secondary | ICD-10-CM

## 2019-05-12 DIAGNOSIS — Z8719 Personal history of other diseases of the digestive system: Secondary | ICD-10-CM | POA: Diagnosis not present

## 2019-05-12 NOTE — Progress Notes (Signed)
Location:  [name]: 101/B Place of Service:  SNF (31) Provider:  [name], DNP, FNP-BC  Patient Care Team: Hannah Covert, MD as PCP - General Hannah Hector, MD as PCP - Cardiology (Cardiology) [name]  Extended Emergency Contact Information Primary Emergency Contact: Hannah [name] of [place] Phone: [phone] Mobile Phone: [phone] Relation: Daughter  Code Status:  Full Code  Goals of care: Advanced Directive information Advanced Directives 05/12/2019  Does Patient Have a Medical Advance Directive? Yes  Type of Advance Directive (No Data)  Does patient want to make changes to medical advance directive? No - Patient declined  Copy of [place] in Chart? -  Would patient like information on creating a medical advance directive? -     Chief Complaint  Patient presents with  . Medical Management of Chronic Issues    Routine visit of medical management    HPI:  Pt is a 74 y.o. female seen today for medical management of chronic diseases. She has PMH of anemia of chronic disease, anxiety, chronic kidney disease stage III, dementia, hyperlipidemia, hypertension, osteoarthritis, S/P mitral valve repair and vitamin B12 deficiency. She was seen in her room today. She is pleasantly confused. Today is day 5 of post Moderna COVID-19 vaccination. No reported fever, SOB nor body aches. She denies difficulty sleeping at night. She takes Ramelteon for insomnia.   Past Medical History:  Diagnosis Date  . Anemia   . Anemia of chronic disease   . Anxiety   . Arthritis    "hands" (05/09/2015)  . Chronic diastolic heart failure ([place])   . CKD (chronic kidney disease) stage 3, GFR 30-59 ml/min   . Dementia ([place])   . Elevated TSH 02/03/2018  . GERD (gastroesophageal reflux disease)   . HLD (hyperlipidemia)   . Hypertension   . LBBB (left bundle branch block)   . Mitral valve  insufficiency   . Nonrheumatic mitral valve regurgitation 02/02/2019  . Osteoarthritis   . 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 S/P mitral valve repair    s/p MitraClip on 02/03/19  . Vitamin B12 deficiency 12/28/2018   Past Surgical History:  Procedure Laterality Date  . BIOPSY  10/28/2018   Procedure: BIOPSY;  Surgeon: Juanita Craver, MD;  Location: [facility] ENDOSCOPY;  Service: Endoscopy;;  . CARDIAC CATHETERIZATION  05/2004  . COLONOSCOPY  07/2002   Archie Endo 09/02/2010  . COLONOSCOPY WITH PROPOFOL N/A 10/28/2018   Procedure: COLONOSCOPY WITH PROPOFOL;  Surgeon: Juanita Craver, MD;  Location: [facility] ENDOSCOPY;  Service: Endoscopy;  Laterality: N/A;  . ENTEROSCOPY N/A 02/24/2019   Procedure: ENTEROSCOPY;  Surgeon: Carol Ada, MD;  Location: [place];  Service: Endoscopy;  Laterality: N/A;  . ESOPHAGOGASTRODUODENOSCOPY  07/2002   w/biopsies/notes 09/02/2010  . ESOPHAGOGASTRODUODENOSCOPY (EGD) WITH PROPOFOL N/A 10/28/2018   Procedure: ESOPHAGOGASTRODUODENOSCOPY (EGD) WITH PROPOFOL;  Surgeon: Juanita Craver, MD;  Location: [facility] END[facility]COPY;  Service: Endoscopy;  Laterality: N/A;  . GIVENS CAPSULE STUDY N/A 02/23/2019   Procedure: GIVENS CAPSULE STUDY;  Surgeon: Carol Ada, MD;  Location: [place];  Service: Endoscopy;  Laterality: N/A;  . HOT HEMOSTASIS N/A 10/28/2018   Procedure: HOT HEMOSTASIS (ARGON PLASMA COAGULATION/BICAP);  Surgeon: Juanita Craver, MD;  Location: [facility] ENDOSCOPY;  Service: Endoscopy;  Laterality: N/A;  . HOT HEMOSTASIS N/A 02/24/2019   Procedure: HOT HEMOSTASIS (ARGON PLASMA COAGULATION/BICAP);  Surgeon: Carol Ada, MD;  Location: [place];  Service: Endoscopy;  Laterality: N/A;  . IR ANGIOGRAM SELECTIVE EACH ADDITIONAL VESSEL  10/30/2018  . IR ANGIOGRAM SELECTIVE EACH ADDITIONAL VESSEL  10/30/2018  . IR ANGIOGRAM SELECTIVE EACH ADDITIONAL VESSEL  10/30/2018  . IR ANGIOGRAM SELECTIVE EACH ADDITIONAL VESSEL  10/30/2018  . IR ANGIOGRAM VISCERAL SELECTIVE  10/30/2018  . IR EMBO ART  VEN HEMORR  LYMPH EXTRAV  INC GUIDE ROADMAPPING  10/30/2018  . IR US GUIDANCE  10/30/2018  . MITRAL VALVE REPAIR N/A 02/02/2019   Procedure: MITRAL VALVE REPAIR;  Surgeon: Sherren Mocha, MD;  Location: [place] CV LAB;  Service: Cardiovascular;  Laterality: N/A;  . POLYPECTOMY  10/28/2018   Procedure: POLYPECTOMY;  Surgeon: Juanita Craver, MD;  Location: [facility] ENDOSCOPY;  Service: Endoscopy;;  . RIGHT/LEFT HEART CATH AND CORONARY ANGIOGRAPHY N/A 12/29/2018   Procedure: RIGHT/LEFT HEART CATH AND CORONARY ANGIOGRAPHY;  Surgeon: Leonie Man, MD;  Location: [place] CV LAB;  Service: Cardiovascular;  Laterality: N/A;  . TEE WITHOUT CARDIOVERSION N/A 12/30/2018   Procedure: TRANSESOPHAGEAL ECHOCARDIOGRAM (TEE);  Surgeon: Lelon Perla, MD;  Location: [facility] ENDOSCOPY;  Service: Cardiovascular;  Laterality: N/A;    Allergies  Allergen Reactions  . Plavix [Clopidogrel]     GI bleed from jejunal angiodysplastic lesions in context of Plavix and aspirin dual therapy    Outpatient Encounter Medications as of 05/12/2019  Medication Sig  . acetaminophen (TYLENOL) 325 MG tablet Take 650 mg by mouth every 6 (six) hours as needed for mild pain or headache.  Marland Kitchen amoxicillin (AMOXIL) 500 MG capsule TAKE 4 CAPSULES (2000MG ) BY MOUTH 1 HOUR BEFORE DENTAL WORK (CLEANINGS INCLUDED)  . aspirin 81 MG chewable tablet Chew 1 tablet (81 mg total) by mouth daily.  Marland Kitchen atorvastatin (LIPITOR) 40 MG tablet Take 1 tablet (40 mg total) by mouth daily.  . cyanocobalamin 1000 MCG tablet Take 1 tablet (1,000 mcg total) by mouth daily.  . ferrous sulfate 324 MG TBEC Take 1 tablet (324 mg total) by mouth daily.  . furosemide (LASIX) 40 MG tablet Take 1.5 tablets (60 mg total) by mouth daily.  . furosemide (LASIX) 40 MG tablet Give 1.5 tabs = 60 mg orally daily as needed for weight gain  . metoprolol succinate (TOPROL-XL) 25 MG 24 hr tablet Take 1 tablet (25 mg total) by mouth daily. Take with or immediately following a meal.  . NON  FORMULARY Diet Regular  . NON FORMULARY Wander Guard Alert Device: Check placement every shift. (Nursing Measure) S/R: Alarms  . pantoprazole (PROTONIX) 40 MG tablet Take 1 tablet (40 mg total) by mouth daily.  . ramelteon (ROZEREM) 8 MG tablet Take 1 tablet (8 mg total) by mouth at bedtime.   No facility-administered encounter medications on file as of 05/12/2019.    Review of Systems  GENERAL: No change in appetite, no fatigue, no weight changes, no fever, chills or weakness MOUTH and THROAT: Denies oral discomfort, gingival pain or bleeding RESPIRATORY: no cough, SOB, DOE, wheezing, hemoptysis CARDIAC: No chest pain, edema or palpitations GI: No abdominal pain, diarrhea, constipation, heart burn, nausea or vomiting GU: Denies dysuria, frequency, hematuria, incontinence, or discharge NEUROLOGICAL: Denies dizziness, syncope, numbness, or headache PSYCHIATRIC: Denies feelings of depression or anxiety. No report of hallucinations, insomnia, paranoia, or agitation   Immunization History  Administered Date(s) Administered  . Influenza Split 01/21/2011, 02/01/2012, 02/09/2013  . Influenza Whole 02/08/2009, 02/03/2010  . Influenza, High Dose Seasonal PF 01/19/2018  . Influenza,inj,Quad PF,6+ Mos 02/21/2014, 02/11/2015, 12/31/2015  . Influenza-Unspecified 01/19/2018, 01/19/2019  . Moderna SARS-COVID-2 Vaccination 05/08/2019  . Pneumococcal Conjugate-13 02/20/2015  . Pneumococcal Polysaccharide-23  04/01/2009  . Tdap 06/03/2011   Pertinent  Health Maintenance Due  Topic Date Due  . COLONOSCOPY  10/27/2028  . INFLUENZA VACCINE  Completed  . DEXA SCAN  Completed  . PNA vac Low Risk Adult  Completed   Fall Risk  11/29/2018 09/26/2018 05/31/2018 02/02/2018 11/09/2017  Falls in the past year? 0 0 0 No No  Number falls in past yr: 0 - - - -     Vitals:   05/12/19 1135  BP: 118/72  Pulse: 70  Resp: 18  Temp: (!) 97.5 F (36.4 C)  TempSrc: Oral  SpO2: 96%  Weight: 165 lb 6.4 oz (75  kg)  Height: 5\' 2"  (1.575 m)   Body mass index is 30.25 kg/m.  Physical Exam  GENERAL APPEARANCE: Well nourished. In no acute distress. Obese SKIN:  Skin is warm and dry.  MOUTH and THROAT: Lips are without lesions. Oral mucosa is moist and without lesions. Tongue is normal in shape, size, and color and without lesions RESPIRATORY: Breathing is even & unlabored, BS CTAB CARDIAC: RRR, no murmur,no extra heart sounds, no edema GI: Abdomen soft, normal BS, no masses, no tenderness EXTREMITIES:  Able to move X 4 extremities NEUROLOGICAL: There is no tremor. Speech is clear. Alert to self, disoriented to time and place. PSYCHIATRIC:  Affect and behavior are appropriate  Labs reviewed: Recent Labs    02/26/19 0422 02/26/19 0422 02/27/19 0517 02/28/19 0454 03/09/19 0000  NA 141   < > 141 139 141  K 3.4*   < > 3.7 3.6 3.3*  CL 102   < > 102 102 100  CO2 27   < > 26 26 29*  GLUCOSE 114*  --  108* 111*  --   BUN 25*   < > 33* 30* 43*  CREATININE 1.50*   < > 1.96* 1.34* 1.3*  CALCIUM 8.9   < > 8.5* 8.6* 9.1  MG 1.5*  --  1.6* 2.0  --   PHOS  --   --  5.0* 4.4  --    < > = values in this interval not displayed.   Recent Labs    12/10/18 0242 12/10/18 0242 12/18/18 0230 12/18/18 0230 01/30/19 1356 02/27/19 0517 02/28/19 0454  AST 14*  --  21  --  19  --   --   ALT 10  --  13  --  12  --   --   ALKPHOS 92  --  105  --  101  --   --   BILITOT 1.0  --  0.8  --  0.8  --   --   PROT 6.3*  --  6.1*  --  7.2  --   --   ALBUMIN 3.5   < > 3.3*   < > 3.8 3.2* 3.2*   < > = values in this interval not displayed.   Recent Labs    01/17/19 2359 01/30/19 1356 02/21/19 1530 02/21/19 1530 02/22/19 0758 02/23/19 0408 02/26/19 0422 02/26/19 0422 02/27/19 0517 02/28/19 0454 03/09/19 0000  WBC 9.5   < > 5.5   < > 6.5   < > 5.9   < > 5.6 5.3 8.8  NEUTROABS 7.2  --  3.4  --  4.2  --   --   --   --   --   --   HGB 8.9*   < > 5.7*   < > 8.0*   < > 12.1   < >  11.0* 10.7* 10.9*  HCT  28.5*   < > 18.5*   < > 24.8*   < > 37.4   < > 35.3* 34.2* 33*  MCV 97.9   < > 101.1*   < > 90.8   < > 92.1  --  93.6 94.5  --   PLT 303   < > 365   < > 300   < > 265  --  244 233  --    < > = values in this interval not displayed.   Lab Results  Component Value Date   TSH 4.175 12/09/2018   Lab Results  Component Value Date   HGBA1C 4.4 (L) 01/30/2019   Lab Results  Component Value Date   CHOL 127 12/09/2018   HDL 55 12/09/2018   LDLCALC 63 12/09/2018   TRIG 44 12/09/2018   CHOLHDL 2.3 12/09/2018     Assessment/Plan  1. Chronic combined systolic and diastolic congestive heart failure (HCC) - no SOB, continue Lasix and Metoprolol  2. History of GI bleed - no reported bleed, continue Pantoprazole  3. Anemia due to acute blood loss Lab Results  Component Value Date   HGB 10.9 (A) 03/09/2019  - continue FeSO4  4. Insomnia, unspecified type - sleeps well at night, continue ramelteon  5. Hyperlipidemia, unspecified hyperlipidemia type Lab Results  Component Value Date   CHOL 127 12/09/2018   HDL 55 12/09/2018   LDLCALC 63 12/09/2018   TRIG 44 12/09/2018   CHOLHDL 2.3 12/09/2018  - continue Atorvastatin  6. Dementia without behavioral disturbance, unspecified dementia type ([place]) - continue supportive care, fall precautions    Family/ staff Communication:  Discussed plan of care with resident.  Labs/tests ordered:  None  Goals of care:   Short-term care   Hannah Age, DNP, FNP-BC [facility] and [facility] [phone] (Monday-Friday 8:00 a.m. - 5:00 p.m.) [phone] (after hours)

## 2019-05-15 NOTE — Addendum Note (Signed)
Addended by: [name] C on: 05/15/2019 11:24 PM   Modules accepted: Level of Service

## 2019-05-19 ENCOUNTER — Non-Acute Institutional Stay (SKILLED_NURSING_FACILITY): Payer: Medicare Other | Admitting: Adult Health

## 2019-05-19 ENCOUNTER — Encounter: Payer: Self-pay | Admitting: Adult Health

## 2019-05-19 DIAGNOSIS — U071 COVID-19: Secondary | ICD-10-CM

## 2019-05-19 NOTE — Progress Notes (Signed)
Location:  [place] Room Number: 101/B Place of Service:  SNF (31) Provider:  [name], DNP, FNP-BC  Patient Care Team: [name], MD as PCP - General [name], MD as PCP - Cardiology (Cardiology) [name]  Extended Emergency Contact Information Primary Emergency Contact: [name] of [place] Phone: [phone] Mobile Phone: [phone] Relation: Daughter  Code Status:  Full Code  Goals of care: Advanced Directive information Advanced Directives 05/19/2019  Does Patient Have a Medical Advance Directive? Yes  Type of Advance Directive (No Data)  Does patient want to make changes to medical advance directive? No - Patient declined  Copy of [place] in Chart? -  Would patient like information on creating a medical advance directive? -     Chief Complaint  Patient presents with  . Acute Visit    Positive Covid-19 test    HPI:  Hannah [name] is a 74 y.o. female who had positive COVID-19 Ag test. No reported fever nor SOB.  She complained of cough last night. [place] 05/08/19, she had her Moderna COVID-19 vaccination.  She has PMH of anemia of chronic disease, anxiety, chronic kidney disease stage III, dementia, hyperlipidemia, hypertension, osteoarthritis, S/P mitral valve repair and vitamin B12 deficiency.   Past Medical History:  Diagnosis Date  . Anemia   . Anemia of chronic disease   . Anxiety   . Arthritis    "hands" (05/09/2015)  . Chronic diastolic heart failure ([place])   . CKD (chronic kidney disease) stage 3, GFR 30-59 ml/min   . Dementia ([place])   . Elevated TSH 02/03/2018  . GERD (gastroesophageal reflux disease)   . HLD (hyperlipidemia)   . Hypertension   . LBBB (left bundle branch block)   . Mitral valve insufficiency   . Nonrheumatic mitral valve regurgitation 02/02/2019  . Osteoarthritis   . 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 S/P mitral valve repair    s/p MitraClip  on 02/03/19  . Vitamin B12 deficiency 12/28/2018   Past Surgical History:  Procedure Laterality Date  . BIOPSY  10/28/2018   Procedure: BIOPSY;  Surgeon: Juanita Craver, MD;  Location: [facility] ENDOSCOPY;  Service: Endoscopy;;  . CARDIAC CATHETERIZATION  05/2004  . COLONOSCOPY  07/2002   Archie Endo 09/02/2010  . COLONOSCOPY WITH PROPOFOL N/A 10/28/2018   Procedure: COLONOSCOPY WITH PROPOFOL;  Surgeon: Juanita Craver, MD;  Location: [facility] ENDOSCOPY;  Service: Endoscopy;  Laterality: N/A;  . ENTEROSCOPY N/A 02/24/2019   Procedure: ENTEROSCOPY;  Surgeon: Carol Ada, MD;  Location: [place];  Service: Endoscopy;  Laterality: N/A;  . ESOPHAGOGASTRODUODENOSCOPY  07/2002   w/biopsies/notes 09/02/2010  . ESOPHAGOGASTRODUODENOSCOPY (EGD) WITH PROPOFOL N/A 10/28/2018   Procedure: ESOPHAGOGASTRODUODENOSCOPY (EGD) WITH PROPOFOL;  Surgeon: Juanita Craver, MD;  Location: [facility] ENDOSCOPY;  Service: Endoscopy;  Laterality: N/A;  . GIVENS CAPSULE STUDY N/A 02/23/2019   Procedure: GIVENS CAPSULE STUDY;  Surgeon: Carol Ada, MD;  Location: [place];  Service: Endoscopy;  Laterality: N/A;  . HOT HEMOSTASIS N/A 10/28/2018   Procedure: HOT HEMOSTASIS (ARGON PLASMA COAGULATION/BICAP);  Surgeon: Juanita Craver, MD;  Location: [facility] ENDOSCOPY;  Service: Endoscopy;  Laterality: N/A;  . HOT HEMOSTASIS N/A 02/24/2019   Procedure: HOT HEMOSTASIS (ARGON PLASMA COAGULATION/BICAP);  Surgeon: Carol Ada, MD;  Location: [place];  Service: Endoscopy;  Laterality: N/A;  . IR ANGIOGRAM SELECTIVE EACH ADDITIONAL VESSEL  10/30/2018  . IR ANGIOGRAM SELECTIVE EACH ADDITIONAL VESSEL  10/30/2018  . IR ANGIOGRAM SELECTIVE EACH ADDITIONAL VESSEL  10/30/2018  .  IR ANGIOGRAM SELECTIVE EACH ADDITIONAL VESSEL  10/30/2018  . IR ANGIOGRAM VISCERAL SELECTIVE  10/30/2018  . IR EMBO ART  VEN HEMORR LYMPH EXTRAV  INC GUIDE ROADMAPPING  10/30/2018  . IR US GUIDANCE  10/30/2018  . MITRAL VALVE REPAIR N/A 02/02/2019   Procedure: MITRAL VALVE REPAIR;  Surgeon: Sherren Mocha, MD;  Location: [place] CV LAB;  Service: Cardiovascular;  Laterality: N/A;  . POLYPECTOMY  10/28/2018   Procedure: POLYPECTOMY;  Surgeon: Juanita Craver, MD;  Location: [facility] ENDOSCOPY;  Service: Endoscopy;;  . RIGHT/LEFT HEART CATH AND CORONARY ANGIOGRAPHY N/A 12/29/2018   Procedure: RIGHT/LEFT HEART CATH AND CORONARY ANGIOGRAPHY;  Surgeon: Leonie Man, MD;  Location: [place] CV LAB;  Service: Cardiovascular;  Laterality: N/A;  . TEE WITHOUT CARDIOVERSION N/A 12/30/2018   Procedure: TRANSESOPHAGEAL ECHOCARDIOGRAM (TEE);  Surgeon: Lelon Perla, MD;  Location: [facility] ENDOSCOPY;  Service: Cardiovascular;  Laterality: N/A;    Allergies  Allergen Reactions  . Plavix [Clopidogrel]     GI bleed from jejunal angiodysplastic lesions in context of Plavix and aspirin dual therapy    Outpatient Encounter Medications as of 05/19/2019  Medication Sig  . acetaminophen (TYLENOL) 325 MG tablet Take 650 mg by mouth every 6 (six) hours as needed for mild pain or headache.  Marland Kitchen amoxicillin (AMOXIL) 500 MG capsule TAKE 4 CAPSULES (2000MG ) BY MOUTH 1 HOUR BEFORE DENTAL WORK (CLEANINGS INCLUDED)  . aspirin 81 MG chewable tablet Chew 1 tablet (81 mg total) by mouth daily.  Marland Kitchen atorvastatin (LIPITOR) 40 MG tablet Take 1 tablet (40 mg total) by mouth daily.  . cyanocobalamin 1000 MCG tablet Take 1 tablet (1,000 mcg total) by mouth daily.  . ferrous sulfate 324 MG TBEC Take 1 tablet (324 mg total) by mouth daily.  . furosemide (LASIX) 40 MG tablet Take 1.5 tablets (60 mg total) by mouth daily.  . furosemide (LASIX) 40 MG tablet Give 1.5 tabs = 60 mg orally daily as needed for weight gain  . metoprolol succinate (TOPROL-XL) 25 MG 24 hr tablet Take 1 tablet (25 mg total) by mouth daily. Take with or immediately following a meal.  . NON FORMULARY Diet Regular  . NON FORMULARY Wander Guard Alert Device: Check placement every shift. (Nursing Measure) S/R: Alarms  . pantoprazole (PROTONIX) 40 MG tablet Take 1  tablet (40 mg total) by mouth daily.  . ramelteon (ROZEREM) 8 MG tablet Take 1 tablet (8 mg total) by mouth at bedtime.   No facility-administered encounter medications on file as of 05/19/2019.    Review of Systems  GENERAL: No change in appetite, no fatigue, no weight changes, no fever, chills or weakness MOUTH and THROAT: Denies oral discomfort, gingival pain or bleeding RESPIRATORY: +cough CARDIAC: No chest pain, edema or palpitations GI: No abdominal pain, diarrhea, constipation, heart burn, nausea or vomiting GU: Denies dysuria, frequency, hematuria, incontinence, or discharge NEUROLOGICAL: Denies dizziness, syncope, numbness, or headache PSYCHIATRIC: Denies feelings of depression or anxiety. No report of hallucinations, insomnia, paranoia, or agitation   Immunization History  Administered Date(s) Administered  . Influenza Split 01/21/2011, 02/01/2012, 02/09/2013  . Influenza Whole 02/08/2009, 02/03/2010  . Influenza, High Dose Seasonal PF 01/19/2018  . Influenza,inj,Quad PF,6+ Mos 02/21/2014, 02/11/2015, 12/31/2015  . Influenza-Unspecified 01/19/2018, 01/19/2019  . Moderna SARS-COVID-2 Vaccination 05/08/2019  . Pneumococcal Conjugate-13 02/20/2015  . Pneumococcal Polysaccharide-23 04/01/2009  . Tdap 06/03/2011   Pertinent  Health Maintenance Due  Topic Date Due  . INFLUENZA VACCINE  Completed  . DEXA SCAN  Completed  .  PNA vac Low Risk Adult  Completed   Fall Risk  11/29/2018 09/26/2018 05/31/2018 02/02/2018 11/09/2017  Falls in the past year? 0 0 0 No No  Number falls in past yr: 0 - - - -     Vitals:   05/19/19 1446  BP: (!) 108/57  Pulse: 97  Resp: 18  Temp: 97.7 F (36.5 C)  TempSrc: Oral  SpO2: 96%  Weight: 164 lb 3.2 oz (74.5 kg)  Height: 5\' 2"  (1.575 m)   Body mass index is 30.03 kg/m.  Physical Exam  GENERAL APPEARANCE: Well nourished. In no acute distress. Obese SKIN:  Skin is warm and dry.  MOUTH and THROAT: Lips are without lesions. Oral mucosa  is moist and without lesions. Tongue is normal in shape, size, and color and without lesions RESPIRATORY: Breathing is even & unlabored, BS CTAB CARDIAC: RRR, no murmur,no extra heart sounds, no edema GI: Abdomen soft, normal BS, no masses, no tenderness EXTREMITIES:  Able to move X 4 extremities NEUROLOGICAL: There is no tremor. Speech is clear. Alert to self, disoriented to time and place. PSYCHIATRIC:  Affect and behavior are appropriate  Labs reviewed: Recent Labs    02/26/19 0422 02/26/19 0422 02/27/19 0517 02/28/19 0454 03/09/19 0000  NA 141   < > 141 139 141  K 3.4*   < > 3.7 3.6 3.3*  CL 102   < > 102 102 100  CO2 27   < > 26 26 29*  GLUCOSE 114*  --  108* 111*  --   BUN 25*   < > 33* 30* 43*  CREATININE 1.50*   < > 1.96* 1.34* 1.3*  CALCIUM 8.9   < > 8.5* 8.6* 9.1  MG 1.5*  --  1.6* 2.0  --   PHOS  --   --  5.0* 4.4  --    < > = values in this interval not displayed.   Recent Labs    12/10/18 0242 12/10/18 0242 12/18/18 0230 12/18/18 0230 01/30/19 1356 02/27/19 0517 02/28/19 0454  AST 14*  --  21  --  19  --   --   ALT 10  --  13  --  12  --   --   ALKPHOS 92  --  105  --  101  --   --   BILITOT 1.0  --  0.8  --  0.8  --   --   PROT 6.3*  --  6.1*  --  7.2  --   --   ALBUMIN 3.5   < > 3.3*   < > 3.8 3.2* 3.2*   < > = values in this interval not displayed.   Recent Labs    01/17/19 2359 01/30/19 1356 02/21/19 1530 02/21/19 1530 02/22/19 0758 02/23/19 0408 02/26/19 0422 02/26/19 0422 02/27/19 0517 02/28/19 0454 03/09/19 0000  WBC 9.5   < > 5.5   < > 6.5   < > 5.9   < > 5.6 5.3 8.8  NEUTROABS 7.2  --  3.4  --  4.2  --   --   --   --   --   --   HGB 8.9*   < > 5.7*   < > 8.0*   < > 12.1   < > 11.0* 10.7* 10.9*  HCT 28.5*   < > 18.5*   < > 24.8*   < > 37.4   < > 35.3* 34.2* 33*  MCV 97.9   < >  101.1*   < > 90.8   < > 92.1  --  93.6 94.5  --   PLT 303   < > 365   < > 300   < > 265  --  244 233  --    < > = values in this interval not displayed.    Lab Results  Component Value Date   TSH 4.175 12/09/2018   Lab Results  Component Value Date   HGBA1C 4.4 (L) 01/30/2019   Lab Results  Component Value Date   CHOL 127 12/09/2018   HDL 55 12/09/2018   LDLCALC 63 12/09/2018   TRIG 44 12/09/2018   CHOLHDL 2.3 12/09/2018     Assessment/Plan  1. Lab test positive for detection of COVID-19 virus - COVID-19 Ag test was positive, will start on zinc sulfate 220 mg daily x5 days, doxycycline 100 mg twice a day x5 days, guaifenesin 100 mg / 5 mL give 10 mL = 200 mg every 6 hours x5 days -Transferred to isolation room   Family/ staff Communication: Discussed plan of care with resident and charge nurse.  Labs/tests ordered: CBC and BMP on 05/22/2019  Goals of care:   Short-term care   [name], DNP, FNP-BC [facility] and Adult Medicine [phone] (Monday-Friday 8:00 a.m. - 5:00 p.m.) [phone] (after hours)

## 2019-05-22 ENCOUNTER — Encounter (HOSPITAL_COMMUNITY): Payer: Self-pay

## 2019-05-22 ENCOUNTER — Inpatient Hospital Stay (HOSPITAL_COMMUNITY)
Admission: EM | Admit: 2019-05-22 | Discharge: 2019-05-26 | DRG: 811 | Disposition: A | Discharge status: Skilled Nursing Facility | Payer: Medicare Other | Source: Skilled Nursing Facility | Attending: Family Medicine | Admitting: Family Medicine

## 2019-05-22 ENCOUNTER — Other Ambulatory Visit: Payer: Self-pay

## 2019-05-22 DIAGNOSIS — R05 Cough: Secondary | ICD-10-CM | POA: Diagnosis not present

## 2019-05-22 DIAGNOSIS — E785 Hyperlipidemia, unspecified: Secondary | ICD-10-CM | POA: Diagnosis not present

## 2019-05-22 DIAGNOSIS — K31811 Angiodysplasia of stomach and duodenum with bleeding: Secondary | ICD-10-CM | POA: Diagnosis not present

## 2019-05-22 DIAGNOSIS — Z95818 Presence of other cardiac implants and grafts: Secondary | ICD-10-CM

## 2019-05-22 DIAGNOSIS — M2550 Pain in unspecified joint: Secondary | ICD-10-CM | POA: Diagnosis not present

## 2019-05-22 DIAGNOSIS — R739 Hyperglycemia, unspecified: Secondary | ICD-10-CM | POA: Diagnosis present

## 2019-05-22 DIAGNOSIS — D62 Acute posthemorrhagic anemia: Principal | ICD-10-CM | POA: Diagnosis present

## 2019-05-22 DIAGNOSIS — Z87891 Personal history of nicotine dependence: Secondary | ICD-10-CM

## 2019-05-22 DIAGNOSIS — K922 Gastrointestinal hemorrhage, unspecified: Secondary | ICD-10-CM | POA: Diagnosis not present

## 2019-05-22 DIAGNOSIS — G4700 Insomnia, unspecified: Secondary | ICD-10-CM | POA: Diagnosis not present

## 2019-05-22 DIAGNOSIS — M19041 Primary osteoarthritis, right hand: Secondary | ICD-10-CM | POA: Diagnosis present

## 2019-05-22 DIAGNOSIS — N1832 Chronic kidney disease, stage 3b: Secondary | ICD-10-CM | POA: Diagnosis present

## 2019-05-22 DIAGNOSIS — D649 Anemia, unspecified: Secondary | ICD-10-CM | POA: Diagnosis present

## 2019-05-22 DIAGNOSIS — R6889 Other general symptoms and signs: Secondary | ICD-10-CM | POA: Diagnosis not present

## 2019-05-22 DIAGNOSIS — R0602 Shortness of breath: Secondary | ICD-10-CM | POA: Diagnosis not present

## 2019-05-22 DIAGNOSIS — R5381 Other malaise: Secondary | ICD-10-CM | POA: Diagnosis not present

## 2019-05-22 DIAGNOSIS — K219 Gastro-esophageal reflux disease without esophagitis: Secondary | ICD-10-CM | POA: Diagnosis not present

## 2019-05-22 DIAGNOSIS — I10 Essential (primary) hypertension: Secondary | ICD-10-CM | POA: Diagnosis not present

## 2019-05-22 DIAGNOSIS — F01518 Vascular dementia, unspecified severity, with other behavioral disturbance: Secondary | ICD-10-CM | POA: Diagnosis present

## 2019-05-22 DIAGNOSIS — Z7952 Long term (current) use of systemic steroids: Secondary | ICD-10-CM

## 2019-05-22 DIAGNOSIS — N179 Acute kidney failure, unspecified: Secondary | ICD-10-CM | POA: Diagnosis present

## 2019-05-22 DIAGNOSIS — R9431 Abnormal electrocardiogram [ECG] [EKG]: Secondary | ICD-10-CM | POA: Diagnosis not present

## 2019-05-22 DIAGNOSIS — Z7982 Long term (current) use of aspirin: Secondary | ICD-10-CM

## 2019-05-22 DIAGNOSIS — K5521 Angiodysplasia of colon with hemorrhage: Secondary | ICD-10-CM | POA: Diagnosis not present

## 2019-05-22 DIAGNOSIS — U071 COVID-19: Secondary | ICD-10-CM | POA: Diagnosis not present

## 2019-05-22 DIAGNOSIS — M19042 Primary osteoarthritis, left hand: Secondary | ICD-10-CM | POA: Diagnosis present

## 2019-05-22 DIAGNOSIS — Z9889 Other specified postprocedural states: Secondary | ICD-10-CM

## 2019-05-22 DIAGNOSIS — Z79899 Other long term (current) drug therapy: Secondary | ICD-10-CM | POA: Diagnosis not present

## 2019-05-22 DIAGNOSIS — I672 Cerebral atherosclerosis: Secondary | ICD-10-CM | POA: Diagnosis not present

## 2019-05-22 DIAGNOSIS — N183 Chronic kidney disease, stage 3 unspecified: Secondary | ICD-10-CM | POA: Diagnosis not present

## 2019-05-22 DIAGNOSIS — F0151 Vascular dementia with behavioral disturbance: Secondary | ICD-10-CM | POA: Diagnosis present

## 2019-05-22 DIAGNOSIS — Z888 Allergy status to other drugs, medicaments and biological substances status: Secondary | ICD-10-CM | POA: Diagnosis not present

## 2019-05-22 DIAGNOSIS — J1282 Pneumonia due to coronavirus disease 2019: Secondary | ICD-10-CM | POA: Diagnosis present

## 2019-05-22 DIAGNOSIS — E876 Hypokalemia: Secondary | ICD-10-CM | POA: Diagnosis not present

## 2019-05-22 DIAGNOSIS — K3189 Other diseases of stomach and duodenum: Secondary | ICD-10-CM | POA: Diagnosis not present

## 2019-05-22 DIAGNOSIS — R531 Weakness: Secondary | ICD-10-CM | POA: Diagnosis not present

## 2019-05-22 DIAGNOSIS — I5042 Chronic combined systolic (congestive) and diastolic (congestive) heart failure: Secondary | ICD-10-CM | POA: Diagnosis not present

## 2019-05-22 DIAGNOSIS — Z7401 Bed confinement status: Secondary | ICD-10-CM | POA: Diagnosis not present

## 2019-05-22 DIAGNOSIS — D631 Anemia in chronic kidney disease: Secondary | ICD-10-CM | POA: Diagnosis not present

## 2019-05-22 DIAGNOSIS — D509 Iron deficiency anemia, unspecified: Secondary | ICD-10-CM | POA: Diagnosis not present

## 2019-05-22 DIAGNOSIS — D638 Anemia in other chronic diseases classified elsewhere: Secondary | ICD-10-CM | POA: Diagnosis not present

## 2019-05-22 DIAGNOSIS — I130 Hypertensive heart and chronic kidney disease with heart failure and stage 1 through stage 4 chronic kidney disease, or unspecified chronic kidney disease: Secondary | ICD-10-CM | POA: Diagnosis present

## 2019-05-22 DIAGNOSIS — Z743 Need for continuous supervision: Secondary | ICD-10-CM | POA: Diagnosis not present

## 2019-05-22 DIAGNOSIS — Z8249 Family history of ischemic heart disease and other diseases of the circulatory system: Secondary | ICD-10-CM

## 2019-05-22 DIAGNOSIS — R059 Cough, unspecified: Secondary | ICD-10-CM

## 2019-05-22 LAB — CBC WITH DIFFERENTIAL/PLATELET
Abs Immature Granulocytes: 0.07 10*3/uL (ref 0.00–0.07)
Basophils Absolute: 0 10*3/uL (ref 0.0–0.1)
Basophils Relative: 0 %
Eosinophils Absolute: 0 10*3/uL (ref 0.0–0.5)
Eosinophils Relative: 0 %
HCT: 20.9 % — ABNORMAL LOW (ref 36.0–46.0)
Hemoglobin: 5.9 g/dL — CL (ref 12.0–15.0)
Immature Granulocytes: 1 %
Lymphocytes Relative: 7 %
Lymphs Abs: 0.7 10*3/uL (ref 0.7–4.0)
MCH: 27.3 pg (ref 26.0–34.0)
MCHC: 28.2 g/dL — ABNORMAL LOW (ref 30.0–36.0)
MCV: 96.8 fL (ref 80.0–100.0)
Monocytes Absolute: 0.4 10*3/uL (ref 0.1–1.0)
Monocytes Relative: 4 %
Neutro Abs: 8.8 10*3/uL — ABNORMAL HIGH (ref 1.7–7.7)
Neutrophils Relative %: 88 %
Platelets: 345 10*3/uL (ref 150–400)
RBC: 2.16 MIL/uL — ABNORMAL LOW (ref 3.87–5.11)
RDW: 15 % (ref 11.5–15.5)
WBC: 9.9 10*3/uL (ref 4.0–10.5)
nRBC: 0.3 % — ABNORMAL HIGH (ref 0.0–0.2)

## 2019-05-22 LAB — POC OCCULT BLOOD, ED: Fecal Occult Bld: NEGATIVE

## 2019-05-22 LAB — COMPREHENSIVE METABOLIC PANEL
ALT: 11 U/L (ref 0–44)
AST: 17 U/L (ref 15–41)
Albumin: 3.2 g/dL — ABNORMAL LOW (ref 3.5–5.0)
Alkaline Phosphatase: 91 U/L (ref 38–126)
Anion gap: 12 (ref 5–15)
BUN: 34 mg/dL — ABNORMAL HIGH (ref 8–23)
CO2: 22 mmol/L (ref 22–32)
Calcium: 8.6 mg/dL — ABNORMAL LOW (ref 8.9–10.3)
Chloride: 102 mmol/L (ref 98–111)
Creatinine, Ser: 1.67 mg/dL — ABNORMAL HIGH (ref 0.44–1.00)
GFR calc Af Amer: 34 mL/min — ABNORMAL LOW (ref >60)
GFR calc non Af Amer: 29 mL/min — ABNORMAL LOW (ref >60)
Glucose, Bld: 229 mg/dL — ABNORMAL HIGH (ref 70–99)
Potassium: 3.3 mmol/L — ABNORMAL LOW (ref 3.5–5.1)
Sodium: 136 mmol/L (ref 135–145)
Total Bilirubin: 0.5 mg/dL (ref 0.3–1.2)
Total Protein: 6.5 g/dL (ref 6.5–8.1)

## 2019-05-22 LAB — RESPIRATORY PANEL BY RT PCR (FLU A&B, COVID)
Influenza A by PCR: NEGATIVE
Influenza B by PCR: NEGATIVE
SARS Coronavirus 2 by RT PCR: POSITIVE — AB

## 2019-05-22 LAB — PREPARE RBC (CROSSMATCH)

## 2019-05-22 MED ORDER — METOPROLOL SUCCINATE ER 25 MG PO TB24: 25.0000 mg | ORAL_TABLET | Freq: Every day | ORAL | Status: DC

## 2019-05-22 MED ORDER — GUAIFENESIN 100 MG/5ML PO SOLN
400.0000 mg | Freq: Four times a day (QID) | ORAL | Status: DC
  Administered 2019-05-23 – 2019-05-26 (×11): 400 mg via ORAL
  Filled 2019-05-22: qty 20
  Filled 2019-05-22: qty 10
  Filled 2019-05-22: qty 5
  Filled 2019-05-22 (×2): qty 20
  Filled 2019-05-22: qty 5
  Filled 2019-05-22 (×6): qty 20
  Filled 2019-05-22: qty 15
  Filled 2019-05-22: qty 5

## 2019-05-22 MED ORDER — ACETAMINOPHEN 325 MG PO TABS
650.0000 mg | ORAL_TABLET | Freq: Four times a day (QID) | ORAL | Status: DC | PRN
  Administered 2019-05-24 – 2019-05-25 (×2): 650 mg via ORAL
  Filled 2019-05-22 (×2): qty 2

## 2019-05-22 MED ORDER — ONDANSETRON HCL 4 MG/2ML IJ SOLN: 4.0000 mg | Freq: Four times a day (QID) | INTRAMUSCULAR | Status: DC | PRN

## 2019-05-22 MED ORDER — FERROUS SULFATE 325 (65 FE) MG PO TABS
324.0000 mg | ORAL_TABLET | Freq: Every day | ORAL | Status: DC
  Administered 2019-05-23 – 2019-05-26 (×4): 324 mg via ORAL
  Filled 2019-05-22 (×4): qty 1

## 2019-05-22 MED ORDER — VITAMIN B-12 1000 MCG PO TABS
1000.0000 ug | ORAL_TABLET | Freq: Every day | ORAL | Status: DC
  Administered 2019-05-23 – 2019-05-26 (×4): 1000 ug via ORAL
  Filled 2019-05-22 (×4): qty 1

## 2019-05-22 MED ORDER — BISACODYL 10 MG RE SUPP: 10.0000 mg | Freq: Every day | RECTAL | Status: DC | PRN

## 2019-05-22 MED ORDER — ACETAMINOPHEN 650 MG RE SUPP: 650.0000 mg | Freq: Four times a day (QID) | RECTAL | Status: DC | PRN

## 2019-05-22 MED ORDER — PANTOPRAZOLE SODIUM 40 MG PO TBEC: 40.0000 mg | DELAYED_RELEASE_TABLET | Freq: Every day | ORAL | Status: DC

## 2019-05-22 MED ORDER — FUROSEMIDE 40 MG PO TABS: 60.0000 mg | ORAL_TABLET | Freq: Every day | ORAL | Status: DC

## 2019-05-22 MED ORDER — ONDANSETRON HCL 4 MG PO TABS: 4.0000 mg | ORAL_TABLET | Freq: Four times a day (QID) | ORAL | Status: DC | PRN

## 2019-05-22 MED ORDER — SODIUM CHLORIDE 0.9% IV SOLUTION: Freq: Once | INTRAVENOUS | Status: DC

## 2019-05-22 MED ORDER — ATORVASTATIN CALCIUM 40 MG PO TABS
40.0000 mg | ORAL_TABLET | Freq: Every day | ORAL | Status: DC
  Administered 2019-05-23 – 2019-05-25 (×3): 40 mg via ORAL
  Filled 2019-05-22 (×3): qty 1

## 2019-05-22 MED ORDER — ZINC SULFATE 220 (50 ZN) MG PO CAPS
220.0000 mg | ORAL_CAPSULE | Freq: Every day | ORAL | Status: DC
  Administered 2019-05-23 – 2019-05-26 (×4): 220 mg via ORAL
  Filled 2019-05-22 (×4): qty 1

## 2019-05-22 MED ORDER — RAMELTEON 8 MG PO TABS
8.0000 mg | ORAL_TABLET | Freq: Every day | ORAL | Status: DC
  Administered 2019-05-23: 8 mg via ORAL
  Filled 2019-05-22 (×2): qty 1

## 2019-05-22 MED ORDER — LORAZEPAM 1 MG PO TABS
0.5000 mg | ORAL_TABLET | Freq: Once | ORAL | Status: AC
  Administered 2019-05-22: 0.5 mg via ORAL
  Filled 2019-05-22: qty 1

## 2019-05-22 NOTE — ED Notes (Signed)
This RN received a call from the blood bank. Pt's 2 units will be delayed. Blood bank stated she was + for antibodies and they needed to figure out which ones first.

## 2019-05-22 NOTE — ED Notes (Signed)
Attempted to call daughter, [name] for blood consent with no answer. This RN left a message for the family to call back as soon as they could.

## 2019-05-22 NOTE — H&P (Signed)
History and Physical    Hannah [name] XTG:[id number] DOB: 30-Oct-1945 DOA: 05/22/2019  PCP: [name], MD  Patient coming from: Home.  Chief Complaint: Low hemoglobin.  History obtained from patient's daughter.  HPI: Hannah [name] is a 74 y.o. female with history of dementia, status post mitral valve clipping for severe mitral regurgitation with history of chronic combined diastolic and systolic heart failure last EF measured was 30 to 35% with history of recent GI bleed in November 2020 requiring coagulation for angiodysplasia of the duodenum was brought to the ER after patient's blood work showed hemoglobin was low.  As per the report no obvious bleeding was found.  Patient also was recently diagnosed with Covid infection and was placed on Decadron 4 days ago.  ED Course: In the ER patient was mildly hypotensive and blood work show hemoglobin of 5.9 creatinine 1.6 blood glucose 229 CRP 1 procalcitonin less than 0.1 chest x-ray shows infiltrates concerning for pneumonia from Covid.  Stool for occult blood was negative.  After discussing with patient's daughter and receiving consent for blood transfusion and treating COVID-19 infection patient is ordered 2 units of PRBC transfusion and started on IV remdesivir and Decadron.  Review of Systems: As per HPI, rest all negative.   Past Medical History:  Diagnosis Date  . Anemia   . Anemia of chronic disease   . Anxiety   . Arthritis    "hands" (05/09/2015)  . Chronic diastolic heart failure ([place])   . CKD (chronic kidney disease) stage 3, GFR 30-59 ml/min   . Dementia ([place])   . Elevated TSH 02/03/2018  . GERD (gastroesophageal reflux disease)   . HLD (hyperlipidemia)   . Hypertension   . LBBB (left bundle branch block)   . Mitral valve insufficiency   . Nonrheumatic mitral valve regurgitation 02/02/2019  . Osteoarthritis   . 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 S/P mitral valve repair    s/p MitraClip on 02/03/19  .  Vitamin B12 deficiency 12/28/2018    Past Surgical History:  Procedure Laterality Date  . BIOPSY  10/28/2018   Procedure: BIOPSY;  Surgeon: Juanita Craver, MD;  Location: [facility] ENDOSCOPY;  Service: Endoscopy;;  . CARDIAC CATHETERIZATION  05/2004  . COLONOSCOPY  07/2002   Archie Endo 09/02/2010  . COLONOSCOPY WITH PROPOFOL N/A 10/28/2018   Procedure: COLONOSCOPY WITH PROPOFOL;  Surgeon: Juanita Craver, MD;  Location: [facility] ENDOSCOPY;  Service: Endoscopy;  Laterality: N/A;  . ENTEROSCOPY N/A 02/24/2019   Procedure: ENTEROSCOPY;  Surgeon: Carol Ada, MD;  Location: [place];  Service: Endoscopy;  Laterality: N/A;  . ESOPHAGOGASTRODUODENOSCOPY  07/2002   w/biopsies/notes 09/02/2010  . ESOPHAGOGASTRODUODENOSCOPY (EGD) WITH PROPOFOL N/A 10/28/2018   Procedure: ESOPHAGOGASTRODUODENOSCOPY (EGD) WITH PROPOFOL;  Surgeon: Juanita Craver, MD;  Location: [facility] ENDOSCOPY;  Service: Endoscopy;  Laterality: N/A;  . GIVENS CAPSULE STUDY N/A 02/23/2019   Procedure: GIVENS CAPSULE STUDY;  Surgeon: Carol Ada, MD;  Location: [place];  Service: Endoscopy;  Laterality: N/A;  . HOT HEMOSTASIS N/A 10/28/2018   Procedure: HOT HEMOSTASIS (ARGON PLASMA COAGULATION/BICAP);  Surgeon: Juanita Craver, MD;  Location: [facility] ENDOSCOPY;  Service: Endoscopy;  Laterality: N/A;  . HOT HEMOSTASIS N/A 02/24/2019   Procedure: HOT HEMOSTASIS (ARGON PLASMA COAGULATION/BICAP);  Surgeon: Carol Ada, MD;  Location: [place];  Service: Endoscopy;  Laterality: N/A;  . IR ANGIOGRAM SELECTIVE EACH ADDITIONAL VESSEL  10/30/2018  . IR ANGIOGRAM SELECTIVE EACH ADDITIONAL VESSEL  10/30/2018  . IR ANGIOGRAM SELECTIVE EACH ADDITIONAL VESSEL  10/30/2018  . IR  ANGIOGRAM SELECTIVE EACH ADDITIONAL VESSEL  10/30/2018  . IR ANGIOGRAM VISCERAL SELECTIVE  10/30/2018  . IR EMBO ART  VEN HEMORR LYMPH EXTRAV  INC GUIDE ROADMAPPING  10/30/2018  . IR US GUIDANCE  10/30/2018  . MITRAL VALVE REPAIR N/A 02/02/2019   Procedure: MITRAL VALVE REPAIR;  Surgeon: Sherren Mocha, MD;   Location: [place] CV LAB;  Service: Cardiovascular;  Laterality: N/A;  . POLYPECTOMY  10/28/2018   Procedure: POLYPECTOMY;  Surgeon: Juanita Craver, MD;  Location: [facility] ENDOSCOPY;  Service: Endoscopy;;  . RIGHT/LEFT HEART CATH AND CORONARY ANGIOGRAPHY N/A 12/29/2018   Procedure: RIGHT/LEFT HEART CATH AND CORONARY ANGIOGRAPHY;  Surgeon: Leonie Man, MD;  Location: [place] CV LAB;  Service: Cardiovascular;  Laterality: N/A;  . TEE WITHOUT CARDIOVERSION N/A 12/30/2018   Procedure: TRANSESOPHAGEAL ECHOCARDIOGRAM (TEE);  Surgeon: Lelon Perla, MD;  Location: [facility] ENDOSCOPY;  Service: Cardiovascular;  Laterality: N/A;     reports that she quit smoking about 14 years ago. Her smoking use included cigarettes. She has a 2.40 pack-year smoking history. She has never used smokeless tobacco. She reports that she does not drink alcohol or use drugs.  Allergies  Allergen Reactions  . Plavix [Clopidogrel] Other (See Comments)    GI bleed from jejunal angiodysplastic lesions in context of Plavix and aspirin dual therapy    Family History  Problem Relation Age of Onset  . Heart disease Mother        died at age 26  . Breast cancer Sister   . Cancer Sister        Breast  . Hypertension Father   . Hypertension Sister     Prior to Admission medications   Medication Sig Start Date End Date Taking? Authorizing Provider  acetaminophen (TYLENOL) 325 MG tablet Take 650 mg by mouth every 6 (six) hours as needed for mild pain or headache.   Yes [provider]  amoxicillin (AMOXIL) 500 MG capsule Take 2,000 mg by mouth See admin instructions. Take four capsules (2000 mg) by mouth one hour before dental work (cleanings included)   Yes [provider]  aspirin 81 MG chewable tablet Chew 1 tablet (81 mg total) by mouth daily. 04/18/19  Yes Medina-Vargas, Monina C, NP  atorvastatin (LIPITOR) 40 MG tablet Take 1 tablet (40 mg total) by mouth daily. Patient taking differently: Take 40 mg  by mouth daily at 6 PM.  04/18/19  Yes Medina-Vargas, Monina C, NP  bisacodyl (DULCOLAX) 10 MG suppository Place 10 mg rectally daily as needed (constipation not relieved by MOM).   Yes [provider]  cyanocobalamin 1000 MCG tablet Take 1 tablet (1,000 mcg total) by mouth daily. 04/18/19  Yes Medina-Vargas, Monina C, NP  doxycycline (VIBRA-TABS) 100 MG tablet Take 100 mg by mouth 2 (two) times daily.   Yes [provider]  ferrous sulfate 324 MG TBEC Take 1 tablet (324 mg total) by mouth daily. 04/18/19  Yes Medina-Vargas, Monina C, NP  furosemide (LASIX) 40 MG tablet Take 1.5 tablets (60 mg total) by mouth daily. Patient taking differently: Take 60 mg by mouth See admin instructions. Take 1 1/2 tablets (60 mg) by mouth daily, may also take 1 1/2 tablets (60 mg) daily as needed for weight gain 04/17/19  Yes Medina-Vargas, Monina C, NP  guaifenesin (ROBITUSSIN) 100 MG/5ML syrup Take 400 mg by mouth every 6 (six) hours.   Yes [provider]  Magnesium Hydroxide (MILK OF MAGNESIA PO) Take 30 mLs by mouth daily as needed (  constipation (if no BM in 3 days)).   Yes [provider]  metoprolol succinate (TOPROL-XL) 25 MG 24 hr tablet Take 1 tablet (25 mg total) by mouth daily. Take with or immediately following a meal. 04/18/19  Yes Medina-Vargas, Monina C, NP  pantoprazole (PROTONIX) 40 MG tablet Take 1 tablet (40 mg total) by mouth daily. Patient taking differently: Take 40 mg by mouth daily before breakfast.  04/18/19  Yes Medina-Vargas, Monina C, NP  predniSONE (DELTASONE) 10 MG tablet Take 30 mg by mouth 2 (two) times daily.   Yes [provider]  ramelteon (ROZEREM) 8 MG tablet Take 1 tablet (8 mg total) by mouth at bedtime. 04/18/19  Yes Medina-Vargas, Monina C, NP  Sodium Phosphates (FLEET ENEMA RE) Place 1 enema rectally daily as needed (constipation not relieved by bisacodyl suppository).   Yes [provider]  zinc sulfate (ZINC-220) 220 (50  Zn) MG capsule Take 220 mg by mouth daily.   Yes [provider]  furosemide (LASIX) 40 MG tablet Give 1.5 tabs = 60 mg orally daily as needed for weight gain Patient not taking: Reported on 05/22/2019 04/18/19   Medina-Vargas, Senaida Lange, NP  NON FORMULARY Diet Regular    [provider]  NON FORMULARY Wander Guard Alert Device: Check placement every shift. (Nursing Measure) S/R: Alarms    [provider]    Physical Exam: Constitutional: Moderately built and nourished. Vitals:   05/22/19 2130 05/22/19 2145 05/22/19 2200 05/22/19 2215  BP: 98/77 (!) 88/58 (!) 115/51 (!) 112/49  Pulse: 100 (!) 102 91 (!) 118  Resp: (!) 23 (!) 21 (!) 23 (!) 25  Temp:      TempSrc:      SpO2: 98% 100% 98% 100%   Eyes: Anicteric mild pallor. ENMT: No discharge from the ears eyes nose or mouth. Neck: No mass or.  No neck rigidity. Respiratory: No rhonchi or crepitations. Cardiovascular: S1-S2 heard. Abdomen: Soft nontender bowel sounds present. Musculoskeletal: No edema.  No joint effusion. Skin: No rash. Neurologic: Alert awake oriented to name only.  Moves all extremities. Psychiatric: Has normal.   Labs on Admission: I have personally reviewed following labs and imaging studies  CBC: Recent Labs  Lab 05/22/19 1951  WBC 9.9  NEUTROABS 8.8*  HGB 5.9*  HCT 20.9*  MCV 96.8  PLT 485   Basic Metabolic Panel: Recent Labs  Lab 05/22/19 1951  NA 136  K 3.3*  CL 102  CO2 22  GLUCOSE 229*  BUN 34*  CREATININE 1.67*  CALCIUM 8.6*   GFR: Estimated Creatinine Clearance: 27.1 mL/min (A) (by C-G formula based on SCr of 1.67 mg/dL (H)). Liver Function Tests: Recent Labs  Lab 05/22/19 1951  AST 17  ALT 11  ALKPHOS 91  BILITOT 0.5  PROT 6.5  ALBUMIN 3.2*   No results for input(s): LIPASE, AMYLASE in the last 168 hours. No results for input(s): AMMONIA in the last 168 hours. Coagulation Profile: No results for input(s): INR, PROTIME in the last 168 hours.  Cardiac Enzymes: No results for input(s): CKTOTAL, CKMB, CKMBINDEX, TROPONINI in the last 168 hours. BNP (last 3 results) No results for input(s): PROBNP in the last 8760 hours. HbA1C: No results for input(s): HGBA1C in the last 72 hours. CBG: No results for input(s): GLUCAP in the last 168 hours. Lipid Profile: No results for input(s): CHOL, HDL, LDLCALC, TRIG, CHOLHDL, LDLDIRECT in the last 72 hours. Thyroid Function Tests: No results for input(s): TSH, T4TOTAL, FREET4, T3FREE, THYROIDAB  in the last 72 hours. Anemia Panel: No results for input(s): VITAMINB12, FOLATE, FERRITIN, TIBC, IRON, RETICCTPCT in the last 72 hours. Urine analysis:    Component Value Date/Time   COLORURINE STRAW (A) 01/30/2019 1357   APPEARANCEUR CLEAR 01/30/2019 1357   LABSPEC 1.005 01/30/2019 1357   PHURINE 6.0 01/30/2019 1357   GLUCOSEU NEGATIVE 01/30/2019 1357   [place] 01/30/2019 1357   [place] 01/30/2019 1357   BILIRUBINUR NEG 08/17/2014 0940   KETONESUR NEGATIVE 01/30/2019 1357   PROTEINUR NEGATIVE 01/30/2019 1357   UROBILINOGEN 1.0 08/17/2014 0940   NITRITE NEGATIVE 01/30/2019 1357   LEUKOCYTESUR SMALL (A) 01/30/2019 1357   Sepsis Labs: @LABRCNTIP (procalcitonin:4,lacticidven:4) )No results found for this or any previous visit (from the past 240 hour(s)).   Radiological Exams on Admission: No results found.    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 Symptomatic anemia with history of angiodysplasia of the duodenum requiring coagulation in November 2020 presently fecal occult blood is negative.  2 units of PRBC transfusion has been ordered  after discussing with patient's daughter and receiving consent from the patient's daughter.  We will keep patient on Protonix and clear liquids.  Consult gastroenterologist in the morning follow CBC after transfusion. 2. Pneumonia secondary to COVID-19 infection we will keep patient IV remdesivir and Decadron.  Closely follow respiratory status. 3. Chronic combined systolic and diastolic CHF with history of mitral valve clipping for severe MR on Lasix.  Presently I am holding of Lasix since blood pressure was initially in the low normal.  Restart Lasix if blood pressure is stable after transfusion. 4. Chronic kidney disease stage III creatinine appears to be at baseline. 5. Hyperglycemia likely from recent use of steroids.  Follow metabolic panel closely.  Check hemoglobin A1c with next blood draw. 6. Dementia no acute issues.  Given the Covid infection and severe anemia will need close monitoring for any deterioration in inpatient status.   DVT prophylaxis: SCDs due to GI bleed. Code Status: Full code confirmed with patient's daughter. Family Communication: Patient's daughter. Disposition Plan: SNF when stable. Consults called: None. Admission status: Inpatient.   Rise Patience MD [facility] Pager [phone].  If 7PM-7AM, please contact night-coverage [email or web address] Password [email or web address]  05/22/2019, 10:30 PM

## 2019-05-22 NOTE — ED Provider Notes (Signed)
[facility] EMERGENCY DEPARTMENT Provider Note   CSN: [id number] Arrival date & time: 05/22/19  1932     History Chief Complaint  Patient presents with  . abnormal labs    Hannah [name] is a 74 y.o. female.  HPI 74 year old female with history of chronic diastolic heart failure, CKD stage III, dementia, hyperlipidemia, history of angiodysplastic disease in the jejunum, presenting to the emergency department for low hemoglobin.  Per report per EMS, patient has tested positive for Covid however patient herself states that she does not think she has Covid.  Patient denies any melena, hematochezia, no hemoptysis or hematemesis, no abdominal pain, no chest pain or shortness of breath, patient has baseline anxiety and she states that she feels short of breath when she gets anxious.  Patient was sent from her rehab facility secondary to a lab that was drawn this morning with hemoglobin of 5.9.  Denies any fevers or chills, no cough, no vomiting, denies any urinary symptoms.  Currently on Plavix but otherwise no other blood thinners.  Patient was admitted back in November of last year for similar symptoms, had a EGD that was performed that revealed angiodysplasia in the jejunum.  No ulcer disease.    Past Medical History:  Diagnosis Date  . Anemia   . Anemia of chronic disease   . Anxiety   . Arthritis    "hands" (05/09/2015)  . Chronic diastolic heart failure ([place])   . CKD (chronic kidney disease) stage 3, GFR 30-59 ml/min   . Dementia ([place])   . Elevated TSH 02/03/2018  . GERD (gastroesophageal reflux disease)   . HLD (hyperlipidemia)   . Hypertension   . LBBB (left bundle branch block)   . Mitral valve insufficiency   . Nonrheumatic mitral valve regurgitation 02/02/2019  . Osteoarthritis   . 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 S/P mitral valve repair    s/p MitraClip on 02/03/19  . Vitamin B12 deficiency 12/28/2018    Patient Active Problem List   Diagnosis Date  Noted  . Anemia due to acute blood loss 03/09/2019  . HLD (hyperlipidemia)   . Insomnia 03/01/2019  . Adverse effect of antiplatelet agent 02/22/2019  . GI bleed 02/21/2019  . Hypokalemia 02/03/2019  . S/P mitral valve repair   . CKD (chronic kidney disease) stage 3, GFR 30-59 ml/min   . Anemia of chronic disease   . Vitamin B12 deficiency 12/28/2018  . Chronic combined systolic and diastolic congestive heart failure ([place])   . AKI (acute kidney injury) ([place])   . Gastric bleed   . Dementia due to atherosclerosis with behavioral disturbance ([place]) 02/02/2018  . Gout 09/11/2015  . Symptomatic anemia 05/08/2015  . Essential hypertension 08/09/2008  . GERD 08/09/2008    Past Surgical History:  Procedure Laterality Date  . BIOPSY  10/28/2018   Procedure: BIOPSY;  Surgeon: Juanita Craver, MD;  Location: [facility] ENDOSCOPY;  Service: Endoscopy;;  . CARDIAC CATHETERIZATION  05/2004  . COLONOSCOPY  07/2002   Archie Endo 09/02/2010  . COLONOSCOPY WITH PROPOFOL N/A 10/28/2018   Procedure: COLONOSCOPY WITH PROPOFOL;  Surgeon: Juanita Craver, MD;  Location: [facility] ENDOSCOPY;  Service: Endoscopy;  Laterality: N/A;  . ENTEROSCOPY N/A 02/24/2019   Procedure: ENTEROSCOPY;  Surgeon: Carol Ada, MD;  Location: [facility];  Service: Endoscopy;  Laterality: N/A;  . ESOPHAGOGASTRODUODENOSCOPY  07/2002   w/biopsies/notes 09/02/2010  . ESOPHAGOGASTRODUODENOSCOPY (EGD) WITH PROPOFOL N/A 10/28/2018   Procedure: ESOPHAGOGASTRODUODENOSCOPY (EGD) WITH PROPOFOL;  Surgeon: Juanita Craver, MD;  Location: [facility]  ENDOSCOPY;  Service: Endoscopy;  Laterality: N/A;  . GIVENS CAPSULE STUDY N/A 02/23/2019   Procedure: GIVENS CAPSULE STUDY;  Surgeon: Carol Ada, MD;  Location: [facility];  Service: Endoscopy;  Laterality: N/A;  . HOT HEMOSTASIS N/A 10/28/2018   Procedure: HOT HEMOSTASIS (ARGON PLASMA COAGULATION/BICAP);  Surgeon: Juanita Craver, MD;  Location: [facility] ENDOSCOPY;  Service: Endoscopy;  Laterality: N/A;  . HOT HEMOSTASIS N/A 02/24/2019     Procedure: HOT HEMOSTASIS (ARGON PLASMA COAGULATION/BICAP);  Surgeon: Carol Ada, MD;  Location: [facility];  Service: Endoscopy;  Laterality: N/A;  . IR ANGIOGRAM SELECTIVE EACH ADDITIONAL VESSEL  10/30/2018  . IR ANGIOGRAM SELECTIVE EACH ADDITIONAL VESSEL  10/30/2018  . IR ANGIOGRAM SELECTIVE EACH ADDITIONAL VESSEL  10/30/2018  . IR ANGIOGRAM SELECTIVE EACH ADDITIONAL VESSEL  10/30/2018  . IR ANGIOGRAM VISCERAL SELECTIVE  10/30/2018  . IR EMBO ART  VEN HEMORR LYMPH EXTRAV  INC GUIDE ROADMAPPING  10/30/2018  . IR US GUIDANCE  10/30/2018  . MITRAL VALVE REPAIR N/A 02/02/2019   Procedure: MITRAL VALVE REPAIR;  Surgeon: Sherren Mocha, MD;  Location: [facility] CV LAB;  Service: Cardiovascular;  Laterality: N/A;  . POLYPECTOMY  10/28/2018   Procedure: POLYPECTOMY;  Surgeon: Juanita Craver, MD;  Location: [facility] ENDOSCOPY;  Service: Endoscopy;;  . RIGHT/LEFT HEART CATH AND CORONARY ANGIOGRAPHY N/A 12/29/2018   Procedure: RIGHT/LEFT HEART CATH AND CORONARY ANGIOGRAPHY;  Surgeon: Leonie Man, MD;  Location: [place] CV LAB;  Service: Cardiovascular;  Laterality: N/A;  . TEE WITHOUT CARDIOVERSION N/A 12/30/2018   Procedure: TRANSESOPHAGEAL ECHOCARDIOGRAM (TEE);  Surgeon: Lelon Perla, MD;  Location: [facility] ENDOSCOPY;  Service: Cardiovascular;  Laterality: N/A;     OB History   No obstetric history on file.     Family History  Problem Relation Age of Onset  . Heart disease Mother        died at age 80  . Breast cancer Sister   . Cancer Sister        Breast  . Hypertension Father   . Hypertension Sister     Social History   Tobacco Use  . Smoking status: Former Smoker    Packs/day: 0.30    Years: 8.00    Pack years: 2.40    Types: Cigarettes    Quit date: 05/30/2004    Years since quitting: 14.9  . Smokeless tobacco: Never Used  Substance Use Topics  . Alcohol use: No    Alcohol/week: 0.0 standard drinks  . Drug use: Never    Home Medications Prior to Admission  medications   Medication Sig Start Date End Date Taking? Authorizing Provider  acetaminophen (TYLENOL) 325 MG tablet Take 650 mg by mouth every 6 (six) hours as needed for mild pain or headache.   Yes [provider]  amoxicillin (AMOXIL) 500 MG capsule Take 2,000 mg by mouth See admin instructions. Take four capsules (2000 mg) by mouth one hour before dental work (cleanings included)   Yes [provider]  aspirin 81 MG chewable tablet Chew 1 tablet (81 mg total) by mouth daily. 04/18/19  Yes Medina-Vargas, Monina C, NP  atorvastatin (LIPITOR) 40 MG tablet Take 1 tablet (40 mg total) by mouth daily. Patient taking differently: Take 40 mg by mouth daily at 6 PM.  04/18/19  Yes Medina-Vargas, Monina C, NP  bisacodyl (DULCOLAX) 10 MG suppository Place 10 mg rectally daily as needed (constipation not relieved by MOM).   Yes [provider]  cyanocobalamin 1000 MCG tablet Take 1 tablet (  1,000 mcg total) by mouth daily. 04/18/19  Yes Medina-Vargas, Monina C, NP  doxycycline (VIBRA-TABS) 100 MG tablet Take 100 mg by mouth 2 (two) times daily.   Yes [provider]  ferrous sulfate 324 MG TBEC Take 1 tablet (324 mg total) by mouth daily. 04/18/19  Yes Medina-Vargas, Monina C, NP  furosemide (LASIX) 40 MG tablet Take 1.5 tablets (60 mg total) by mouth daily. Patient taking differently: Take 60 mg by mouth See admin instructions. Take 1 1/2 tablets (60 mg) by mouth daily, may also take 1 1/2 tablets (60 mg) daily as needed for weight gain 04/17/19  Yes Medina-Vargas, Monina C, NP  guaifenesin (ROBITUSSIN) 100 MG/5ML syrup Take 400 mg by mouth every 6 (six) hours.   Yes [provider]  Magnesium Hydroxide (MILK OF MAGNESIA PO) Take 30 mLs by mouth daily as needed (constipation (if no BM in 3 days)).   Yes [provider]  metoprolol succinate (TOPROL-XL) 25 MG 24 hr tablet Take 1 tablet (25 mg total) by mouth daily. Take with or immediately following a  meal. 04/18/19  Yes Medina-Vargas, Monina C, NP  pantoprazole (PROTONIX) 40 MG tablet Take 1 tablet (40 mg total) by mouth daily. Patient taking differently: Take 40 mg by mouth daily before breakfast.  04/18/19  Yes Medina-Vargas, Monina C, NP  predniSONE (DELTASONE) 10 MG tablet Take 30 mg by mouth 2 (two) times daily.   Yes [provider]  ramelteon (ROZEREM) 8 MG tablet Take 1 tablet (8 mg total) by mouth at bedtime. 04/18/19  Yes Medina-Vargas, Monina C, NP  Sodium Phosphates (FLEET ENEMA RE) Place 1 enema rectally daily as needed (constipation not relieved by bisacodyl suppository).   Yes [provider]  zinc sulfate (ZINC-220) 220 (50 Zn) MG capsule Take 220 mg by mouth daily.   Yes [provider]  furosemide (LASIX) 40 MG tablet Give 1.5 tabs = 60 mg orally daily as needed for weight gain Patient not taking: Reported on 05/22/2019 04/18/19   Medina-Vargas, Senaida Lange, NP  NON FORMULARY Diet Regular    [provider]  NON FORMULARY Wander Guard Alert Device: Check placement every shift. (Nursing Measure) S/R: Alarms    [provider]    Allergies    Plavix [clopidogrel]  Review of Systems   Review of Systems  Constitutional: Negative for chills and fever.  HENT: Negative for ear pain and sore throat.   Eyes: Negative for pain and visual disturbance.  Respiratory: Negative for cough and shortness of breath.   Cardiovascular: Negative for chest pain and palpitations.  Gastrointestinal: Negative for abdominal pain and vomiting.  Genitourinary: Negative for dysuria and hematuria.  Musculoskeletal: Negative for arthralgias and back pain.  Skin: Negative for color change and rash.  Neurological: Negative for seizures and syncope.  Psychiatric/Behavioral: Negative for agitation, behavioral problems, confusion and decreased concentration.  All other systems reviewed and are negative.   Physical Exam Updated Vital Signs BP (!) 112/49    Pulse (!) 118   Temp 99.1 F (37.3 C) (Oral)   Resp (!) 25   SpO2 100%   Physical Exam Vitals and nursing note reviewed. Exam conducted with a chaperone present.  Constitutional:      General: She is not in acute distress.    Appearance: She is well-developed. She is not ill-appearing or toxic-appearing.  HENT:     Head: Normocephalic and atraumatic.     Right Ear: External ear normal.     Left Ear:  External ear normal.     Nose: Nose normal. No congestion.     Mouth/Throat:     Mouth: Mucous membranes are moist.     Pharynx: Oropharynx is clear.  Eyes:     Comments: Pale conjun bilateral  Cardiovascular:     Rate and Rhythm: Regular rhythm. Tachycardia present.     Heart sounds: No murmur.  Pulmonary:     Effort: Pulmonary effort is normal. No respiratory distress.     Breath sounds: Normal breath sounds.  Abdominal:     General: There is no distension.     Palpations: Abdomen is soft. There is no mass.     Tenderness: There is no abdominal tenderness.     Hernia: No hernia is present.  Genitourinary:    Rectum: Normal.     Comments: No blood per rectum Heme occult neg Normal exam Musculoskeletal:     Cervical back: Neck supple.  Skin:    General: Skin is warm and dry.     Capillary Refill: Capillary refill takes less than 2 seconds.     Coloration: Skin is pale.  Neurological:     General: No focal deficit present.     Mental Status: She is alert. Mental status is at baseline.  Psychiatric:        Mood and Affect: Mood normal.        Behavior: Behavior normal.     ED Results / Procedures / Treatments   Labs (all labs ordered are listed, but only abnormal results are displayed) Labs Reviewed  CBC WITH DIFFERENTIAL/PLATELET - Abnormal; Notable for the following components:      Result Value   RBC 2.16 (*)    Hemoglobin 5.9 (*)    HCT 20.9 (*)    MCHC 28.2 (*)    nRBC 0.3 (*)    Neutro Abs 8.8 (*)    All other components within normal limits    COMPREHENSIVE METABOLIC PANEL - Abnormal; Notable for the following components:   Potassium 3.3 (*)    Glucose, Bld 229 (*)    BUN 34 (*)    Creatinine, Ser 1.67 (*)    Calcium 8.6 (*)    Albumin 3.2 (*)    GFR calc non Af Amer 29 (*)    GFR calc Af Amer 34 (*)    All other components within normal limits  RESPIRATORY PANEL BY RT PCR (FLU A&B, COVID)  URINALYSIS, ROUTINE W REFLEX MICROSCOPIC  POC OCCULT BLOOD, ED  TYPE AND SCREEN  PREPARE RBC (CROSSMATCH)    EKG None  Radiology No results found.  Procedures Procedures (including critical care time)  Medications Ordered in ED Medications  0.9 %  sodium chloride infusion (Manually program via Guardrails IV Fluids) (has no administration in time range)  atorvastatin (LIPITOR) tablet 40 mg (has no administration in time range)  furosemide (LASIX) tablet 60 mg (has no administration in time range)  metoprolol succinate (TOPROL-XL) 24 hr tablet 25 mg (has no administration in time range)  ramelteon (ROZEREM) tablet 8 mg (has no administration in time range)  bisacodyl (DULCOLAX) suppository 10 mg (has no administration in time range)  pantoprazole (PROTONIX) EC tablet 40 mg (has no administration in time range)  vitamin B-12 (CYANOCOBALAMIN) tablet 1,000 mcg (has no administration in time range)  ferrous sulfate tablet 324 mg (has no administration in time range)  zinc sulfate capsule 220 mg (has no administration in time range)  guaifenesin (ROBITUSSIN) 100 MG/5ML syrup 400 mg (has  no administration in time range)  acetaminophen (TYLENOL) tablet 650 mg (has no administration in time range)    Or  acetaminophen (TYLENOL) suppository 650 mg (has no administration in time range)  ondansetron (ZOFRAN) tablet 4 mg (has no administration in time range)    Or  ondansetron (ZOFRAN) injection 4 mg (has no administration in time range)  LORazepam (ATIVAN) tablet 0.5 mg (0.5 mg Oral Given 05/22/19 2116)    ED Course  I have reviewed  the triage vital signs and the nursing notes.  Pertinent labs & imaging results that were available during my care of the patient were reviewed by me and considered in my medical decision making (see chart for details).    MDM Rules/Calculators/A&P                      74 year old presenting with anemia.  Hemoglobin 5.9 here.  Hemoccult negative, no active bleeding per rectum on my exam.  Likely slow bleed from her angiodysplasia that was seen back in November.  Patient otherwise hemodynamically stable on arrival, afebrile.  Patient will be transfused 2 units RBCs in the ED, patient will need admission.  Patient has CKD, mildly worsening creatinine on her metabolic panel.  Covid pending.  Patient admitted in stable condition.  The attending physician was present and available for all medical decision making and procedures related to this patient's care.  Final Clinical Impression(s) / ED Diagnoses Final diagnoses:  Anemia, unspecified type    Rx / DC Orders ED Discharge Orders    None       Kizzie Fantasia, MD 05/22/19 2307    Merrily Pew, MD 05/22/19 2326

## 2019-05-22 NOTE — ED Triage Notes (Signed)
Pt arrived via [facility] [place] from [facility]. Pt is COVID + and was sent here for a low hgb.

## 2019-05-23 ENCOUNTER — Observation Stay (HOSPITAL_COMMUNITY): Payer: Medicare Other

## 2019-05-23 ENCOUNTER — Inpatient Hospital Stay (HOSPITAL_COMMUNITY): Payer: Medicare Other | Admitting: Certified Registered"

## 2019-05-23 ENCOUNTER — Encounter (HOSPITAL_COMMUNITY): Payer: Self-pay | Admitting: Internal Medicine

## 2019-05-23 ENCOUNTER — Encounter (HOSPITAL_COMMUNITY)
Admission: EM | Disposition: A | Discharge status: Skilled Nursing Facility | Payer: Self-pay | Source: Skilled Nursing Facility | Attending: Family Medicine

## 2019-05-23 ENCOUNTER — Other Ambulatory Visit: Payer: Self-pay

## 2019-05-23 DIAGNOSIS — Z79899 Other long term (current) drug therapy: Secondary | ICD-10-CM | POA: Diagnosis not present

## 2019-05-23 DIAGNOSIS — Z87891 Personal history of nicotine dependence: Secondary | ICD-10-CM | POA: Diagnosis not present

## 2019-05-23 DIAGNOSIS — D62 Acute posthemorrhagic anemia: Secondary | ICD-10-CM | POA: Diagnosis not present

## 2019-05-23 DIAGNOSIS — Z7952 Long term (current) use of systemic steroids: Secondary | ICD-10-CM | POA: Diagnosis not present

## 2019-05-23 DIAGNOSIS — D649 Anemia, unspecified: Secondary | ICD-10-CM | POA: Diagnosis present

## 2019-05-23 DIAGNOSIS — N183 Chronic kidney disease, stage 3 unspecified: Secondary | ICD-10-CM | POA: Diagnosis not present

## 2019-05-23 DIAGNOSIS — K5521 Angiodysplasia of colon with hemorrhage: Secondary | ICD-10-CM | POA: Diagnosis not present

## 2019-05-23 DIAGNOSIS — U071 COVID-19: Secondary | ICD-10-CM | POA: Diagnosis not present

## 2019-05-23 DIAGNOSIS — K922 Gastrointestinal hemorrhage, unspecified: Secondary | ICD-10-CM | POA: Diagnosis not present

## 2019-05-23 DIAGNOSIS — Z7982 Long term (current) use of aspirin: Secondary | ICD-10-CM | POA: Diagnosis not present

## 2019-05-23 DIAGNOSIS — E785 Hyperlipidemia, unspecified: Secondary | ICD-10-CM | POA: Diagnosis present

## 2019-05-23 DIAGNOSIS — M19042 Primary osteoarthritis, left hand: Secondary | ICD-10-CM | POA: Diagnosis present

## 2019-05-23 DIAGNOSIS — I130 Hypertensive heart and chronic kidney disease with heart failure and stage 1 through stage 4 chronic kidney disease, or unspecified chronic kidney disease: Secondary | ICD-10-CM | POA: Diagnosis not present

## 2019-05-23 DIAGNOSIS — Z888 Allergy status to other drugs, medicaments and biological substances status: Secondary | ICD-10-CM | POA: Diagnosis not present

## 2019-05-23 DIAGNOSIS — N1832 Chronic kidney disease, stage 3b: Secondary | ICD-10-CM | POA: Diagnosis not present

## 2019-05-23 DIAGNOSIS — I5042 Chronic combined systolic (congestive) and diastolic (congestive) heart failure: Secondary | ICD-10-CM | POA: Diagnosis not present

## 2019-05-23 DIAGNOSIS — K219 Gastro-esophageal reflux disease without esophagitis: Secondary | ICD-10-CM | POA: Diagnosis present

## 2019-05-23 DIAGNOSIS — Z95818 Presence of other cardiac implants and grafts: Secondary | ICD-10-CM | POA: Diagnosis not present

## 2019-05-23 DIAGNOSIS — N179 Acute kidney failure, unspecified: Secondary | ICD-10-CM | POA: Diagnosis not present

## 2019-05-23 DIAGNOSIS — G4700 Insomnia, unspecified: Secondary | ICD-10-CM | POA: Diagnosis present

## 2019-05-23 DIAGNOSIS — D631 Anemia in chronic kidney disease: Secondary | ICD-10-CM | POA: Diagnosis not present

## 2019-05-23 DIAGNOSIS — M19041 Primary osteoarthritis, right hand: Secondary | ICD-10-CM | POA: Diagnosis present

## 2019-05-23 DIAGNOSIS — E876 Hypokalemia: Secondary | ICD-10-CM | POA: Diagnosis not present

## 2019-05-23 DIAGNOSIS — R739 Hyperglycemia, unspecified: Secondary | ICD-10-CM | POA: Diagnosis not present

## 2019-05-23 DIAGNOSIS — I10 Essential (primary) hypertension: Secondary | ICD-10-CM | POA: Diagnosis not present

## 2019-05-23 DIAGNOSIS — J1282 Pneumonia due to coronavirus disease 2019: Secondary | ICD-10-CM | POA: Diagnosis present

## 2019-05-23 DIAGNOSIS — R05 Cough: Secondary | ICD-10-CM | POA: Diagnosis not present

## 2019-05-23 DIAGNOSIS — R9431 Abnormal electrocardiogram [ECG] [EKG]: Secondary | ICD-10-CM | POA: Diagnosis present

## 2019-05-23 DIAGNOSIS — F0151 Vascular dementia with behavioral disturbance: Secondary | ICD-10-CM | POA: Diagnosis present

## 2019-05-23 HISTORY — PX: HOT HEMOSTASIS: SHX5433

## 2019-05-23 HISTORY — PX: ENTEROSCOPY: SHX5533

## 2019-05-23 LAB — CBC WITH DIFFERENTIAL/PLATELET
Abs Immature Granulocytes: 0.04 10*3/uL (ref 0.00–0.07)
Basophils Absolute: 0 10*3/uL (ref 0.0–0.1)
Basophils Relative: 0 %
Eosinophils Absolute: 0 10*3/uL (ref 0.0–0.5)
Eosinophils Relative: 0 %
HCT: 20.4 % — ABNORMAL LOW (ref 36.0–46.0)
Hemoglobin: 5.8 g/dL — CL (ref 12.0–15.0)
Immature Granulocytes: 0 %
Lymphocytes Relative: 17 %
Lymphs Abs: 1.7 10*3/uL (ref 0.7–4.0)
MCH: 26.9 pg (ref 26.0–34.0)
MCHC: 28.4 g/dL — ABNORMAL LOW (ref 30.0–36.0)
MCV: 94.4 fL (ref 80.0–100.0)
Monocytes Absolute: 0.6 10*3/uL (ref 0.1–1.0)
Monocytes Relative: 6 %
Neutro Abs: 7.8 10*3/uL — ABNORMAL HIGH (ref 1.7–7.7)
Neutrophils Relative %: 77 %
Platelets: 358 10*3/uL (ref 150–400)
RBC: 2.16 MIL/uL — ABNORMAL LOW (ref 3.87–5.11)
RDW: 15.1 % (ref 11.5–15.5)
WBC: 10.1 10*3/uL (ref 4.0–10.5)
nRBC: 0.5 % — ABNORMAL HIGH (ref 0.0–0.2)

## 2019-05-23 LAB — COMPREHENSIVE METABOLIC PANEL
ALT: 11 U/L (ref 0–44)
AST: 17 U/L (ref 15–41)
Albumin: 3.3 g/dL — ABNORMAL LOW (ref 3.5–5.0)
Alkaline Phosphatase: 100 U/L (ref 38–126)
Anion gap: 13 (ref 5–15)
BUN: 39 mg/dL — ABNORMAL HIGH (ref 8–23)
CO2: 22 mmol/L (ref 22–32)
Calcium: 8.7 mg/dL — ABNORMAL LOW (ref 8.9–10.3)
Chloride: 101 mmol/L (ref 98–111)
Creatinine, Ser: 1.59 mg/dL — ABNORMAL HIGH (ref 0.44–1.00)
GFR calc Af Amer: 36 mL/min — ABNORMAL LOW (ref >60)
GFR calc non Af Amer: 31 mL/min — ABNORMAL LOW (ref >60)
Glucose, Bld: 124 mg/dL — ABNORMAL HIGH (ref 70–99)
Potassium: 3.4 mmol/L — ABNORMAL LOW (ref 3.5–5.1)
Sodium: 136 mmol/L (ref 135–145)
Total Bilirubin: 0.5 mg/dL (ref 0.3–1.2)
Total Protein: 6.6 g/dL (ref 6.5–8.1)

## 2019-05-23 LAB — CBC
HCT: 28.6 % — ABNORMAL LOW (ref 36.0–46.0)
Hemoglobin: 8.7 g/dL — ABNORMAL LOW (ref 12.0–15.0)
MCH: 28.2 pg (ref 26.0–34.0)
MCHC: 30.4 g/dL (ref 30.0–36.0)
MCV: 92.6 fL (ref 80.0–100.0)
Platelets: 308 10*3/uL (ref 150–400)
RBC: 3.09 MIL/uL — ABNORMAL LOW (ref 3.87–5.11)
RDW: 16.6 % — ABNORMAL HIGH (ref 11.5–15.5)
WBC: 11.8 10*3/uL — ABNORMAL HIGH (ref 4.0–10.5)
nRBC: 0.4 % — ABNORMAL HIGH (ref 0.0–0.2)

## 2019-05-23 LAB — TROPONIN I (HIGH SENSITIVITY)
Troponin I (High Sensitivity): 28 ng/L — ABNORMAL HIGH (ref <18)
Troponin I (High Sensitivity): 24 ng/L — ABNORMAL HIGH (ref <18)

## 2019-05-23 LAB — D-DIMER, QUANTITATIVE: D-Dimer, Quant: 0.55 ug/mL-FEU — ABNORMAL HIGH (ref 0.00–0.50)

## 2019-05-23 LAB — C-REACTIVE PROTEIN: CRP: 1 mg/dL — ABNORMAL HIGH (ref <1.0)

## 2019-05-23 LAB — FERRITIN: Ferritin: 12 ng/mL (ref 11–307)

## 2019-05-23 LAB — PROCALCITONIN: Procalcitonin: <0.1 ng/mL

## 2019-05-23 LAB — PREPARE RBC (CROSSMATCH)

## 2019-05-23 SURGERY — ENTEROSCOPY
Anesthesia: General

## 2019-05-23 MED ORDER — SODIUM CHLORIDE 0.9 % IV SOLN: INTRAVENOUS | Status: DC | PRN

## 2019-05-23 MED ORDER — SUCCINYLCHOLINE CHLORIDE 20 MG/ML IJ SOLN
INTRAMUSCULAR | Status: DC | PRN
  Administered 2019-05-23: 140 mg via INTRAVENOUS

## 2019-05-23 MED ORDER — SODIUM CHLORIDE 0.9 % IV SOLN
200.0000 mg | Freq: Once | INTRAVENOUS | Status: AC
  Administered 2019-05-23: 200 mg via INTRAVENOUS
  Filled 2019-05-23: qty 200

## 2019-05-23 MED ORDER — LIDOCAINE 2% (20 MG/ML) 5 ML SYRINGE
INTRAMUSCULAR | Status: DC | PRN
  Administered 2019-05-23: 80 mg via INTRAVENOUS
  Administered 2019-05-23: 20 mg via INTRAVENOUS

## 2019-05-23 MED ORDER — SODIUM CHLORIDE 0.9 % IV SOLN: 100.0000 mg | Freq: Every day | INTRAVENOUS | Status: DC

## 2019-05-23 MED ORDER — SODIUM CHLORIDE 0.9% IV SOLUTION: Freq: Once | INTRAVENOUS | Status: DC

## 2019-05-23 MED ORDER — FENTANYL CITRATE (PF) 100 MCG/2ML IJ SOLN
INTRAMUSCULAR | Status: DC | PRN
  Administered 2019-05-23 (×2): 50 ug via INTRAVENOUS

## 2019-05-23 MED ORDER — DEXAMETHASONE SODIUM PHOSPHATE 10 MG/ML IJ SOLN
6.0000 mg | INTRAMUSCULAR | Status: DC
  Administered 2019-05-23: 6 mg via INTRAVENOUS
  Filled 2019-05-23: qty 1

## 2019-05-23 MED ORDER — DEXAMETHASONE SODIUM PHOSPHATE 10 MG/ML IJ SOLN
INTRAMUSCULAR | Status: DC | PRN
  Administered 2019-05-23: 4 mg via INTRAVENOUS

## 2019-05-23 MED ORDER — PANTOPRAZOLE SODIUM 40 MG IV SOLR
40.0000 mg | Freq: Two times a day (BID) | INTRAVENOUS | Status: DC
  Administered 2019-05-23 – 2019-05-26 (×7): 40 mg via INTRAVENOUS
  Filled 2019-05-23 (×8): qty 40

## 2019-05-23 MED ORDER — POTASSIUM CHLORIDE CRYS ER 20 MEQ PO TBCR
40.0000 meq | EXTENDED_RELEASE_TABLET | Freq: Once | ORAL | Status: AC
  Administered 2019-05-23: 40 meq via ORAL
  Filled 2019-05-23: qty 2

## 2019-05-23 MED ORDER — FENTANYL CITRATE (PF) 100 MCG/2ML IJ SOLN
INTRAMUSCULAR | Status: AC
  Filled 2019-05-23: qty 2

## 2019-05-23 MED ORDER — LACTATED RINGERS IV SOLN: INTRAVENOUS | Status: DC | PRN

## 2019-05-23 MED ORDER — ONDANSETRON HCL 4 MG/2ML IJ SOLN
INTRAMUSCULAR | Status: DC | PRN
  Administered 2019-05-23: 4 mg via INTRAVENOUS

## 2019-05-23 MED ORDER — PROPOFOL 10 MG/ML IV BOLUS
INTRAVENOUS | Status: DC | PRN
  Administered 2019-05-23: 100 mg via INTRAVENOUS
  Administered 2019-05-23: 20 mg via INTRAVENOUS

## 2019-05-23 NOTE — Progress Notes (Signed)
Called patients daughter at [phone] to obtain consent for diagnostic procedure.  Ginger Gleson, RN and Rudi Rummage, RN obtained verbal consent from Charlott Calvario, patients daughter, at number  [phone].

## 2019-05-23 NOTE — Discharge Summary (Addendum)
[facility] Discharge Summary  [name] Medical record number: [id number] Date of birth: [birth year] Age: 74 y.o. Gender: female Date of Admission: 05/22/2019  Date of Discharge: 02/04 Admitting Physician: Rise Patience, MD  Primary Care Provider: [name], MD Consultants: GI  Indication for Hospitalization: Symptomatic Anemia  Discharge Diagnoses/Problem List:  Symptomatic Anemia COVID Pneumonia HFpPF s/p Mitral valve repair AKI on CKD IIIb HTN Hypokalemia HTN HLD Insomnia Dementia  Disposition: SNF  Discharge Condition: Stable  Discharge Exam: 05/26/2019 General: 74 year old female in no acute distress Cardiovascular: Regular rate and rhythm, no murmurs or gallops appreciated Respiratory: Clear to auscultation bilaterally, no wheezes, no crackles noted Abdomen: Soft, nontender, nondistended, bowel sounds present Extremities: No lower extremity edema  Brief Hospital Course:   Hannah [name] is a 74 y.o. female presenting with symptomatic anemia. PMHx is significant for dementia, mitral regurgitation status post mitral valve clipping, HFrEF, recent GI bleed in November 2020. Patient initially admitted by [facility] but transferred to [place] on 05/23/19.   Symptomatic Anemia Patient found to have hemoglobin of 5.9 at SNF.  Repeat at the hospital was 5.8.  Patient was symptomatic with dizziness.  Has a history of GI bleed, sees Dr. Collene Mares as an outpatient.  Recent EGD in November showing angiodysplasia in the jejunum.  GI was consulted during this admission and patient underwent a small bowel enteroscopy 02/02 which showed normal esophagus, diffuse nodular mucosa in the entire stomach, normal duodenum, 3 nonbleeding angiodysplastic lesions in the jejunum which were treated with a monopolar probe.  No active bleeding in the proximal small bowel.  Did receive a total of 2U PRBCs and hemoglobin improved to 8.7/ 8.4 and on  discharge hemoglobin 9.6 Of note patient was on aspirin as an outpatient for history of MitraClip.  Spoke with patient's cardiologist who agreed to discontinue aspirin indefinitely.  On discharge patient was recommended to continue p.o. Protonix 40mg  daily.  COVID 19 Patient recently diagnosed with COVID-19 as an outpatient.  Completed 5 days prednisone and doxycycline prior to coming into the hospital.  When at the hospital did receive dose of Decadron and remdesivir, however, did not require any supplemental oxygenation so this was discontinued on 2/2.  Was recommended to [facility] on discharge.  Prolonged QTC QTC 506 on admission. Avoid QTc prolonging medications.   Issues for Follow Up:  1. Follow up with GI, Dr Collene Mares in 4 weeks 2. Cardiology okay with discontinuing ASA therapy. 3. Repeat HbG 02/05 4. COVID positive, monitor respiratory status 5. Soft blood pressures.  Repeat blood pressure check 02/05.  Continue metoprolol 25 mg daily, Lasix decreased to 40 mg daily, monitor for fluid status. 6. Avoid QTC prolongation medications.  Significant Procedures: Small bowel enteroscopy 02/02  Significant Labs and Imaging:  Recent Labs  Lab 05/24/19 0813 05/24/19 0813 05/24/19 1501 05/25/19 0801 05/26/19 0821  WBC 11.6*  --   --  9.9 7.4  HGB 8.4*   < > 8.6* 8.3* 9.6*  HCT 28.4*   < > 28.9* 28.7* 32.9*  PLT 308  --   --  294 295   < > = values in this interval not displayed.   Recent Labs  Lab 05/22/19 1951 05/22/19 1951 05/23/19 0250 05/23/19 0250 05/24/19 0813 05/24/19 0813 05/25/19 0453 05/26/19 0821  NA 136  --  136  --  144  --  142 141  K 3.3*   < > 3.4*   < > 4.8   < >  4.3 4.2  CL 102  --  101  --  111  --  109 111  CO2 22  --  22  --  22  --  20* 22  GLUCOSE 229*  --  124*  --  110*  --  97 104*  BUN 34*  --  39*  --  39*  --  37* 21  CREATININE 1.67*  --  1.59*  --  1.37*  --  1.58* 1.07*  CALCIUM 8.6*  --  8.7*  --  8.8*  --  8.3* 8.2*  ALKPHOS 91  --   100  --  87  --  90 96  AST 17  --  17  --  24  --  16 16  ALT 11  --  11  --  13  --  12 13  ALBUMIN 3.2*  --  3.3*  --  2.9*  --  2.8* 3.1*   < > = values in this interval not displayed.      Results/Tests Pending at Time of Discharge:  Unresulted Labs (From admission, onward)    Start     Ordered   05/24/19 0500  CBC with Differential/Platelet  Daily,   R    Question:  Specimen collection method  Answer:  Lab=Lab collect   05/23/19 0934   05/24/19 0500  Comprehensive metabolic panel  Daily,   R    Question:  Specimen collection method  Answer:  Lab=Lab collect   05/23/19 0934   05/22/19 1952  Urinalysis, Routine w reflex microscopic  ONCE - STAT,   STAT     05/22/19 1952           Discharge Medications:  Allergies as of 05/26/2019      Reactions   Plavix [clopidogrel] Other (See Comments)   GI bleed from jejunal angiodysplastic lesions in context of Plavix and aspirin dual therapy      Medication List    STOP taking these medications   amoxicillin 500 MG capsule Commonly known as: AMOXIL   aspirin 81 MG chewable tablet   doxycycline 100 MG tablet Commonly known as: VIBRA-TABS   predniSONE 10 MG tablet Commonly known as: DELTASONE     TAKE these medications   acetaminophen 325 MG tablet Commonly known as: TYLENOL Take 650 mg by mouth every 6 (six) hours as needed for mild pain or headache.   atorvastatin 40 MG tablet Commonly known as: LIPITOR Take 1 tablet (40 mg total) by mouth daily. What changed: when to take this   bisacodyl 10 MG suppository Commonly known as: DULCOLAX Place 10 mg rectally daily as needed (constipation not relieved by MOM).   cyanocobalamin 1000 MCG tablet Take 1 tablet (1,000 mcg total) by mouth daily.   ferrous sulfate 324 MG Tbec Take 1 tablet (324 mg total) by mouth daily.   FLEET ENEMA RE Place 1 enema rectally daily as needed (constipation not relieved by bisacodyl suppository).   furosemide 40 MG tablet Commonly  known as: LASIX Take 1 tablet (40 mg total) by mouth daily. What changed:   how much to take  Another medication with the same name was removed. Continue taking this medication, and follow the directions you see here.   guaifenesin 100 MG/5ML syrup Commonly known as: ROBITUSSIN Take 400 mg by mouth every 6 (six) hours.   metoprolol succinate 25 MG 24 hr tablet Commonly known as: TOPROL-XL Take 1 tablet (25 mg total) by mouth daily. Take with  or immediately following a meal.   MILK OF MAGNESIA PO Take 30 mLs by mouth daily as needed (constipation (if no BM in 3 days)).   NON FORMULARY Diet Regular   NON FORMULARY Wander Guard Alert Device: Check placement every shift. (Nursing Measure) S/R: Alarms   pantoprazole 40 MG tablet Commonly known as: PROTONIX Take 1 tablet (40 mg total) by mouth daily. What changed: when to take this   ramelteon 8 MG tablet Commonly known as: ROZEREM Take 1 tablet (8 mg total) by mouth at bedtime.   Zinc-220 220 (50 Zn) MG capsule Generic drug: zinc sulfate Take 220 mg by mouth daily.       Discharge Instructions: Please refer to Patient Instructions section of EMR for full details.  Patient was counseled important signs and symptoms that should prompt return to medical care, changes in medications, dietary instructions, activity restrictions, and follow up appointments.   Follow-Up Appointments: Contact information for after-discharge care    [facility] .   Service: Skilled Nursing Contact information: [address] [place] [phone]              Carollee Leitz, MD 05/26/2019, 12:59 PM PGY-1, [facility]  Resident Attestation   I saw and evaluated the patient, performing the key elements of the service.I  personally performed or re-performed the history, physical exam, and medical decision making activities of this service and have verified that  the service and findings are accurately documented in the resident's note. I developed the management plan that is described in the resident's note, and I agree with the content, with my edits above in red.   Harolyn Rutherford, DO [facility], PGY-3

## 2019-05-23 NOTE — Op Note (Signed)
[facility] [name] Procedure Date : 05/23/2019 MRN: [id number] Attending MD: Carol Ada , MD Date of Birth: 08-Jul-1945 CSN: [id number] Age: 74 Admit Type: Inpatient Procedure:                Small bowel enteroscopy Indications:              Iron deficiency anemia Providers:                Carol Ada, MD, Angus Seller, William Dalton,                            Technician, Theodora Blow, Technician Referring MD:              Medicines:                Propofol per Anesthesia Complications:            No immediate complications. Estimated Blood Loss:     Estimated blood loss: none. Procedure:                Pre-Anesthesia Assessment:                           - Prior to the procedure, a History and Physical                            was performed, and patient medications and                            allergies were reviewed. The patient's tolerance of                            previous anesthesia was also reviewed. The risks                            and benefits of the procedure and the sedation                            options and risks were discussed with the patient.                            All questions were answered, and informed consent                            was obtained. Prior Anticoagulants: The patient has                            taken no previous anticoagulant or antiplatelet                            agents. ASA Grade Assessment: III - A patient with                            severe systemic disease. After reviewing the risks  and benefits, the patient was deemed in                            satisfactory condition to undergo the procedure.                           - Sedation was administered by an anesthesia                            professional. Deep sedation was attained.                           After obtaining informed consent, the endoscope was                            passed  under direct vision. Throughout the                            procedure, the patient's blood pressure, pulse, and                            oxygen saturations were monitored continuously. The                            PCF-H190DL ([id number] Olympus pediatric colonoscope                            was introduced through the mouth and advanced to                            the small bowel distal to the Ligament of Treitz.                            The small bowel enteroscopy was accomplished                            without difficulty. The patient tolerated the                            procedure well. Scope In: Scope Out: Findings:      The esophagus was normal.      Diffuse nodular mucosa was found in the entire examined stomach.      The examined duodenum was normal.      Three angiodysplastic lesions with no bleeding were found in the       proximal jejunum. Coagulation for tissue destruction using monopolar       probe was successful. Estimated blood loss: none.      The nodular mucosa was again identified in the gastric lumen, which is       consistent with hyperplasia. This was previously noted and biopsied by       Dr. Collene Mares 10/2018. In the jejunum, three nonbleeding AVMs were identified       and ablated with APC. There was no evidence of any active bleeding in       the proximal small bowel. Impression:               -  Normal esophagus.                           - Nodular mucosa in the entire stomach.                           - Normal examined duodenum.                           - Three non-bleeding angiodysplastic lesions in the                            jejunum. Treated with a monopolar probe.                           - No specimens collected. Recommendation:           - Return patient to hospital ward for ongoing care.                           - Resume regular diet.                           - Follow HGB and transfuse as necessary.                           - Follow  up with Dr. Collene Mares in 4 weeks. Procedure Code(s):        --- Professional ---                           [phone], Small intestinal endoscopy, enteroscopy                            beyond second portion of duodenum, not including                            ileum; with ablation of tumor(s), polyp(s), or                            other lesion(s) not amenable to removal by hot                            biopsy forceps, bipolar cautery or snare technique Diagnosis Code(s):        --- Professional ---                           K55.20, Angiodysplasia of colon without hemorrhage                           K31.89, Other diseases of stomach and duodenum                           D50.9, Iron deficiency anemia, unspecified CPT copyright 2019 American Medical Association. All rights reserved. The codes documented in this report are preliminary and upon coder review may  be revised to meet current compliance requirements. Carol Ada,  MD Carol Ada, MD 05/23/2019 3:58:34 PM This report has been signed electronically. Number of Addenda: 0

## 2019-05-23 NOTE — Progress Notes (Signed)
Metal bracelet on left arm was placed in patients belongings bag so patient can go to endoscopy.

## 2019-05-23 NOTE — Progress Notes (Addendum)
Family Medicine Teaching Service Daily Progress Note Intern Pager: [phone]  [name] Medical record number: [id number] Date of birth: 1945-11-10 Age: 74 y.o. Gender: female  Primary Care Provider: [name], MD Consultants: GI  Code Status: Full, confirmed with patient's daughter per admitting team  Pt Overview and Major Events to Date:  Admitted to [facility]ist service on 05/22/19 Transferred to [place] 05/23/19  Assessment and Plan: Hannah [name] is a 74 y.o. female presenting with low Hgb. PMHx is significant for dementia, mitral regurgitation status post mitral valve clipping, HFrEF, recent GI bleed in November 2020.  Symptomatic Anemia Currently receiving 2 unit PRBC transfusion.  In the chart it looks like 4 units total were ordered.  I discussed this with RN.  There was some confusion yesterday over consent and consent for blood was only given this a.m.  At that time RN started transfusion.  Currently getting her first unit and will get a second unit afterwards.  Initially admitted to the [facility] hospitalist service on 2/1 for symptomatic anemia.  Patient without a rehab facility who obtained lab draw and saw hemoglobin of 5.9. Repeat this AM 5.8.  Did report some dizziness associated with anemia.  Denied any melena or hematochezia as well as hemoptysis or hematemesis at time of admission.  FOBT was negative in the ED.  Patient was on ASA, this was held during admission.  Recent admission in November for GI bleed at which time EGD showed angiodysplasia in the jejunum. Patient is a.m. feels improved.  States dizziness is resolved.  Per daughter, reports that patient baseline hemoglobin has been around 10-11 recently.  Spoke to Hannah [name], GI, this am who will see patient. Advised to keep NPO -monitor post transfusion H/H -trend Hgb, transfusion threshold >7 -GI consulted, appreciate recommendations -IV protonix 40mg  bid -ferrous sulfate   -hold ASA and  plavix -NPO, GI will change diet per their evaluation  -cardiac monitoring -strict Is and Os  COVID-19 Pneumonia Patient found to be Covid positive on admission.  Was previously diagnosed as an outpatient after having cough. Started on prednisone and doxycyline 5 days ago.  Did not require any supplemental oxygen while in the hospital. CRP and d dimer elevated on admission.  Denies any shortness of breath or cough. -s/p prednisone (1/28-2/2) -given no O2 use, will dc decadron (2/2) & remdesivir (2/2) -monitor O2 -proning 16h/day -airborne and contact precuations  -daily CMP, CBC, d-dimer, CRP  HFrEF s/p mitral valve repair 01/2019 Echo from 03/02/19 showing EF 30-35% with normally functioning mitraclip. Followed by [facility]. Home meds include: Metoprolol 25mg  QD, Lasix 60mg  daily. H/o DAPT with ASA and plavix, plavix was dced 2/2 GI bleeds. Weight this AM 72.8 kg. No charted output.  Denies any edema or shortness of breath at this time.  Physical exam did not show any JVD or pitting edema.  Euvolemic on exam. -strict Is and Os -daily weights -can discuss with cardiologist on dc if patient should continue ASA  AKI on CKD IIIb Cr this AM 1.59. BL ~1.3-1.4.  -monitor on daily BMP  HTN Borderline hypotensive with BP 117/68. Home meds: Losartan, metoprolol, lasix -holding home meds while NPO -restart as BP tolerates   H/o prolonged QTc No recent EKG for comparison.  QTc on cardiac monitor ranged between 540-550. -will obtain EKG -avoid QTC prolonging medications  Hypokalemia K 3.4, will replete with 40 mEq KDUR -replete PRN  Hyperglycemia Likely elevated in the setting of  steroid use. AM CBC showing glucose 124 -monitor on daily CMP -consider starting glycemic control agent  HLD Home meds: atorvastatin 40 mg - continue home meds  Insomnia Home meds: trazodone - hold trazodone  - can add ramelteon vs melatonin if needed  Dementia Poor historian at  baseline -will contact daughter as needed  FEN/GI: NPO PPx: SCDs  Disposition: awaiting GI recs  Subjective:  Patient this morning feels well.  States she is no longer feeling any dizziness.  Denies any shortness of breath or chest pain.  States that she was very worried when she got diagnosed with coronavirus that she is seen on the news that it is deadly.  States she is very happy to be in the hospital in the receiving the care she is having.  I spoke to patient's daughter, Hannah [name].  Hannah [name] is healthcare power of attorney.  Has requested that we not talk to any of her other siblings.  Hannah [name] reports that patient has a history of GI bleeds and baseline hemoglobin is around 10-11.  States that she was very worried when she heard her hemoglobin was down to 5.  She was also very concerned about the diagnosis of coronavirus.  Hannah [name] and her family have recently been diagnosed with coronavirus as well.  Very happy to hear that Hannah [name] will see patient as she sees Hannah [name] as an outpatient.  Was also very happy to hear that Hannah [name] was on the service as this is her outpatient PCP.  Objective: Temp:  [97.7 F (36.5 C)-99.1 F (37.3 C)] 97.7 F (36.5 C) (02/02 0913) Pulse Rate:  [86-118] 90 (02/02 0913) Resp:  [18-32] 21 (02/02 0913) BP: (88-138)/(47-78) 130/60 (02/02 0913) SpO2:  [96 %-100 %] 97 % (02/02 0913) Weight:  [72.8 kg-75.4 kg] 72.8 kg (02/02 0500) Physical Exam: General: awake and alert, laying in bed, NAD, oriented x2 (thought it was 2020) Cardiovascular: RRR, no MRG, no JVD Respiratory: CTAB, no increased WOB, speaking full sentences, moving throughout the bed and able to sit up without difficulty  Abdomen: soft, non tender, non distended, bowel sounds present  Extremities: no edema  Laboratory: Recent Labs  Lab 05/22/19 1951 05/23/19 0250  WBC 9.9 10.1  HGB 5.9* 5.8*  HCT 20.9* 20.4*  PLT 345 358   Recent Labs  Lab 05/22/19 1951 05/23/19 0250  NA  136 136  K 3.3* 3.4*  CL 102 101  CO2 22 22  BUN 34* 39*  CREATININE 1.67* 1.59*  CALCIUM 8.6* 8.7*  PROT 6.5 6.6  BILITOT 0.5 0.5  ALKPHOS 91 100  ALT 11 11  AST 17 17  GLUCOSE 229* 124*     Imaging/Diagnostic Tests: No new images   Hannah More, DO 05/23/2019, 10:44 AM PGY-3, [place] Intern pager: [phone], text pages welcome

## 2019-05-23 NOTE — Consult Note (Signed)
Reason for Consult: IDA and known history of small bowel AVMs Referring Physician: Family Medicine  Roselie Awkward HPI: This is a 74 year old female with a PMH of bleeding proximal small bowel AVMs, GERD, CKD, diastolic heart failure s/p mitraclip admitted for complaints of SOB.  She was noted to be COVID-19 positive.  During the work up she was found to have an anemia.  Her HGB on admission was at 5.9 g/dL and it was previously at 10.7 g/dL on 03/09/2019.  A VCE performed on 02/23/2019 was positive for multiple AVMs in the proximal jejunum.  An enteroscopy was performed the next day and all visible AVMs were ablated with APC.  There was evidence of fresh blood, but the source was not able to be isolated.  Past Medical History:  Diagnosis Date  . Anemia   . Anemia of chronic disease   . Anxiety   . Arthritis    "hands" (05/09/2015)  . Chronic diastolic heart failure ([place])   . CKD (chronic kidney disease) stage 3, GFR 30-59 ml/min   . Dementia ([place])   . Elevated TSH 02/03/2018  . GERD (gastroesophageal reflux disease)   . HLD (hyperlipidemia)   . Hypertension   . LBBB (left bundle branch block)   . Mitral valve insufficiency   . Nonrheumatic mitral valve regurgitation 02/02/2019  . Osteoarthritis   . 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 S/P mitral valve repair    s/p MitraClip on 02/03/19  . Vitamin B12 deficiency 12/28/2018    Past Surgical History:  Procedure Laterality Date  . BIOPSY  10/28/2018   Procedure: BIOPSY;  Surgeon: Juanita Craver, MD;  Location: [facility] ENDOSCOPY;  Service: Endoscopy;;  . CARDIAC CATHETERIZATION  05/2004  . COLONOSCOPY  07/2002   Archie Endo 09/02/2010  . COLONOSCOPY WITH PROPOFOL N/A 10/28/2018   Procedure: COLONOSCOPY WITH PROPOFOL;  Surgeon: Juanita Craver, MD;  Location: [facility] ENDOSCOPY;  Service: Endoscopy;  Laterality: N/A;  . ENTEROSCOPY N/A 02/24/2019   Procedure: ENTEROSCOPY;  Surgeon: Carol Ada, MD;  Location: [place];  Service: Endoscopy;  Laterality: N/A;   . ESOPHAGOGASTRODUODENOSCOPY  07/2002   w/biopsies/notes 09/02/2010  . ESOPHAGOGASTRODUODENOSCOPY (EGD) WITH PROPOFOL N/A 10/28/2018   Procedure: ESOPHAGOGASTRODUODENOSCOPY (EGD) WITH PROPOFOL;  Surgeon: Juanita Craver, MD;  Location: [facility] ENDOSCOPY;  Service: Endoscopy;  Laterality: N/A;  . GIVENS CAPSULE STUDY N/A 02/23/2019   Procedure: GIVENS CAPSULE STUDY;  Surgeon: Carol Ada, MD;  Location: [place];  Service: Endoscopy;  Laterality: N/A;  . HOT HEMOSTASIS N/A 10/28/2018   Procedure: HOT HEMOSTASIS (ARGON PLASMA COAGULATION/BICAP);  Surgeon: Juanita Craver, MD;  Location: [facility] ENDOSCOPY;  Service: Endoscopy;  Laterality: N/A;  . HOT HEMOSTASIS N/A 02/24/2019   Procedure: HOT HEMOSTASIS (ARGON PLASMA COAGULATION/BICAP);  Surgeon: Carol Ada, MD;  Location: [place];  Service: Endoscopy;  Laterality: N/A;  . IR ANGIOGRAM SELECTIVE EACH ADDITIONAL VESSEL  10/30/2018  . IR ANGIOGRAM SELECTIVE EACH ADDITIONAL VESSEL  10/30/2018  . IR ANGIOGRAM SELECTIVE EACH ADDITIONAL VESSEL  10/30/2018  . IR ANGIOGRAM SELECTIVE EACH ADDITIONAL VESSEL  10/30/2018  . IR ANGIOGRAM VISCERAL SELECTIVE  10/30/2018  . IR EMBO ART  VEN HEMORR LYMPH EXTRAV  INC GUIDE ROADMAPPING  10/30/2018  . IR US GUIDANCE  10/30/2018  . MITRAL VALVE REPAIR N/A 02/02/2019   Procedure: MITRAL VALVE REPAIR;  Surgeon: Sherren Mocha, MD;  Location: [place] CV LAB;  Service: Cardiovascular;  Laterality: N/A;  . POLYPECTOMY  10/28/2018   Procedure: POLYPECTOMY;  Surgeon: Juanita Craver, MD;  Location: [place] ENDOSCOPY;  Service: Endoscopy;;  . RIGHT/LEFT HEART CATH AND CORONARY ANGIOGRAPHY N/A 12/29/2018   Procedure: RIGHT/LEFT HEART CATH AND CORONARY ANGIOGRAPHY;  Surgeon: Leonie Man, MD;  Location: [place] CV LAB;  Service: Cardiovascular;  Laterality: N/A;  . TEE WITHOUT CARDIOVERSION N/A 12/30/2018   Procedure: TRANSESOPHAGEAL ECHOCARDIOGRAM (TEE);  Surgeon: Lelon Perla, MD;  Location: [facility] ENDOSCOPY;  Service:  Cardiovascular;  Laterality: N/A;    Family History  Problem Relation Age of Onset  . Heart disease Mother        died at age 68  . Breast cancer Sister   . Cancer Sister        Breast  . Hypertension Father   . Hypertension Sister     Social History:  reports that she quit smoking about 14 years ago. Her smoking use included cigarettes. She has a 2.40 pack-year smoking history. She has never used smokeless tobacco. She reports that she does not drink alcohol or use drugs.  Allergies:  Allergies  Allergen Reactions  . Plavix [Clopidogrel] Other (See Comments)    GI bleed from jejunal angiodysplastic lesions in context of Plavix and aspirin dual therapy    Medications:  Scheduled: . sodium chloride   Intravenous Once  . sodium chloride   Intravenous Once  . atorvastatin  40 mg Oral q1800  . ferrous sulfate  324 mg Oral Daily  . guaiFENesin  400 mg Oral Q6H  . pantoprazole (PROTONIX) IV  40 mg Intravenous Q12H  . cyanocobalamin  1,000 mcg Oral Daily  . zinc sulfate  220 mg Oral Daily   Continuous:   Results for orders placed or performed during the hospital encounter of 05/22/19 (from the past 24 hour(s))  CBC with Differential     Status: Abnormal   Collection Time: 05/22/19  7:51 PM  Result Value Ref Range   WBC 9.9 4.0 - 10.5 K/uL   RBC 2.16 (L) 3.87 - 5.11 MIL/uL   Hemoglobin 5.9 (LL) 12.0 - 15.0 g/dL   HCT 20.9 (L) 36.0 - 46.0 %   MCV 96.8 80.0 - 100.0 fL   MCH 27.3 26.0 - 34.0 pg   MCHC 28.2 (L) 30.0 - 36.0 g/dL   RDW 15.0 11.5 - 15.5 %   Platelets 345 150 - 400 K/uL   nRBC 0.3 (H) 0.0 - 0.2 %   Neutrophils Relative % 88 %   Neutro Abs 8.8 (H) 1.7 - 7.7 K/uL   Lymphocytes Relative 7 %   Lymphs Abs 0.7 0.7 - 4.0 K/uL   Monocytes Relative 4 %   Monocytes Absolute 0.4 0.1 - 1.0 K/uL   Eosinophils Relative 0 %   Eosinophils Absolute 0.0 0.0 - 0.5 K/uL   Basophils Relative 0 %   Basophils Absolute 0.0 0.0 - 0.1 K/uL   Immature Granulocytes 1 %   Abs  Immature Granulocytes 0.07 0.00 - 0.07 K/uL  Comprehensive metabolic panel     Status: Abnormal   Collection Time: 05/22/19  7:51 PM  Result Value Ref Range   Sodium 136 135 - 145 mmol/L   Potassium 3.3 (L) 3.5 - 5.1 mmol/L   Chloride 102 98 - 111 mmol/L   CO2 22 22 - 32 mmol/L   Glucose, Bld 229 (H) 70 - 99 mg/dL   BUN 34 (H) 8 - 23 mg/dL   Creatinine, Ser 1.67 (H) 0.44 - 1.00 mg/dL   Calcium 8.6 (L) 8.9 - 10.3 mg/dL   Total Protein 6.5 6.5 - 8.1 g/dL  Albumin 3.2 (L) 3.5 - 5.0 g/dL   AST 17 15 - 41 U/L   ALT 11 0 - 44 U/L   Alkaline Phosphatase 91 38 - 126 U/L   Total Bilirubin 0.5 0.3 - 1.2 mg/dL   GFR calc non Af Amer 29 (L) >60 mL/min   GFR calc Af Amer 34 (L) >60 mL/min   Anion gap 12 5 - 15  Type and screen [place]     Status: None (Preliminary result)   Collection Time: 05/22/19  8:01 PM  Result Value Ref Range   ABO/RH(D) O NEG    Antibody Screen POS    Sample Expiration 05/25/2019,2359    Antibody Identification ANTI D ANTI C    DAT, IgG POS    Antibody ID,T [place] WARM AUTOANTIBODY    Unit Number [id number]    Blood Component Type RED CELLS,LR    Unit division 00    Status of Unit ISSUED    Donor AG Type NEGATIVE FOR C ANTIGEN    Transfusion Status OK TO TRANSFUSE    Crossmatch Result COMPATIBLE    Unit Number [id number]    Blood Component Type RED CELLS,LR    Unit division 00    Status of Unit ISSUED    Donor AG Type NEGATIVE FOR C ANTIGEN    Transfusion Status OK TO TRANSFUSE    Crossmatch Result COMPATIBLE   POC occult blood, ED     Status: None   Collection Time: 05/22/19  8:03 PM  Result Value Ref Range   Fecal Occult Bld NEGATIVE NEGATIVE  Prepare RBC     Status: None   Collection Time: 05/22/19 10:30 PM  Result Value Ref Range   Order Confirmation      ORDER PROCESSED BY BLOOD BANK Performed at [facility], [place] [address], [place], [place] [address]   Respiratory Panel by RT PCR (Flu A&B, Covid) -  Nasopharyngeal Swab     Status: Abnormal   Collection Time: 05/22/19 10:34 PM   Specimen: Nasopharyngeal Swab  Result Value Ref Range   SARS Coronavirus 2 by RT PCR POSITIVE (A) NEGATIVE   Influenza A by PCR NEGATIVE NEGATIVE   Influenza B by PCR NEGATIVE NEGATIVE  D-dimer, quantitative (not at [facility])     Status: Abnormal   Collection Time: 05/23/19  2:50 AM  Result Value Ref Range   D-Dimer, Quant 0.55 (H) 0.00 - 0.50 ug/mL-FEU  Ferritin     Status: None   Collection Time: 05/23/19  2:50 AM  Result Value Ref Range   Ferritin 12 11 - 307 ng/mL  CBC with Differential/Platelet     Status: Abnormal   Collection Time: 05/23/19  2:50 AM  Result Value Ref Range   WBC 10.1 4.0 - 10.5 K/uL   RBC 2.16 (L) 3.87 - 5.11 MIL/uL   Hemoglobin 5.8 (LL) 12.0 - 15.0 g/dL   HCT 20.4 (L) 36.0 - 46.0 %   MCV 94.4 80.0 - 100.0 fL   MCH 26.9 26.0 - 34.0 pg   MCHC 28.4 (L) 30.0 - 36.0 g/dL   RDW 15.1 11.5 - 15.5 %   Platelets 358 150 - 400 K/uL   nRBC 0.5 (H) 0.0 - 0.2 %   Neutrophils Relative % 77 %   Neutro Abs 7.8 (H) 1.7 - 7.7 K/uL   Lymphocytes Relative 17 %   Lymphs Abs 1.7 0.7 - 4.0 K/uL   Monocytes Relative 6 %   Monocytes Absolute 0.6  0.1 - 1.0 K/uL   Eosinophils Relative 0 %   Eosinophils Absolute 0.0 0.0 - 0.5 K/uL   Basophils Relative 0 %   Basophils Absolute 0.0 0.0 - 0.1 K/uL   Immature Granulocytes 0 %   Abs Immature Granulocytes 0.04 0.00 - 0.07 K/uL  Comprehensive metabolic panel     Status: Abnormal   Collection Time: 05/23/19  2:50 AM  Result Value Ref Range   Sodium 136 135 - 145 mmol/L   Potassium 3.4 (L) 3.5 - 5.1 mmol/L   Chloride 101 98 - 111 mmol/L   CO2 22 22 - 32 mmol/L   Glucose, Bld 124 (H) 70 - 99 mg/dL   BUN 39 (H) 8 - 23 mg/dL   Creatinine, Ser 1.59 (H) 0.44 - 1.00 mg/dL   Calcium 8.7 (L) 8.9 - 10.3 mg/dL   Total Protein 6.6 6.5 - 8.1 g/dL   Albumin 3.3 (L) 3.5 - 5.0 g/dL   AST 17 15 - 41 U/L   ALT 11 0 - 44 U/L   Alkaline Phosphatase 100 38 - 126 U/L    Total Bilirubin 0.5 0.3 - 1.2 mg/dL   GFR calc non Af Amer 31 (L) >60 mL/min   GFR calc Af Amer 36 (L) >60 mL/min   Anion gap 13 5 - 15  C-reactive protein     Status: Abnormal   Collection Time: 05/23/19  2:50 AM  Result Value Ref Range   CRP 1.0 (H) <1.0 mg/dL  Procalcitonin     Status: None   Collection Time: 05/23/19  2:50 AM  Result Value Ref Range   Procalcitonin <0.10 ng/mL  Troponin I (High Sensitivity)     Status: Abnormal   Collection Time: 05/23/19  2:50 AM  Result Value Ref Range   Troponin I (High Sensitivity) 28 (H) <18 ng/L  Troponin I (High Sensitivity)     Status: Abnormal   Collection Time: 05/23/19  4:13 AM  Result Value Ref Range   Troponin I (High Sensitivity) 24 (H) <18 ng/L  Prepare RBC     Status: None   Collection Time: 05/23/19  6:25 AM  Result Value Ref Range   Order Confirmation      ORDER PROCESSED BY BLOOD BANK BB SAMPLE OR UNITS ALREADY AVAILABLE Performed at [facility], [address], [place], [place] [address]      DG Chest Port 1 View  Result Date: 05/23/2019 CLINICAL DATA:  Cough and COVID-19 positivity EXAM: PORTABLE CHEST 1 VIEW COMPARISON:  02/21/2019 FINDINGS: Cardiac shadow is within limits. Changes of prior mitral valve repair are noted. Aortic calcifications are noted. Lungs are well aerated bilaterally. Patchy opacities are noted in both lungs consistent with the clinical history of COVID-19 positivity. No bony abnormality is noted. IMPRESSION: Patchy opacities consistent with the given clinical history of COVID-19 positivity. Electronically Signed   By: Inez Catalina M.D.   On: 05/23/2019 02:43    ROS:  As stated above in the HPI otherwise negative.  Blood pressure 129/66, pulse 93, temperature 97.9 F (36.6 C), temperature source Oral, resp. rate (!) 22, height 5\' 2"  (1.575 m), weight 72.8 kg, SpO2 97 %.    PE: Gen: NAD HEENT:  [place]/AT, EOMI Neck: Supple, no LAD Lungs: CTA Bilaterally CV: RRR without M/G/R ABM: Soft, NTND,  +BS Ext: No C/C/E  Assessment/Plan: 1) IDA. 2) History of proximal jejunal AVMs. 3) COVID-19.   A repeat enteroscopy will be performed.  Hopefully a bleeding site can be  localized.  Plan: 1) Enteroscopy with COVID-19 precautions.  Jobie Popp D 05/23/2019, 2:20 PM

## 2019-05-23 NOTE — Transfer of Care (Signed)
Immediate Anesthesia Transfer of Care Note  Patient: Hannah [name]  Procedure(s) Performed: ENTEROSCOPY (N/A ) HOT HEMOSTASIS (ARGON PLASMA COAGULATION/BICAP) (N/A )  Patient Location: PACU and Endoscopy Unit  Anesthesia Type:General  Level of Consciousness: awake and alert   Airway & Oxygen Therapy: Patient Spontanous Breathing and Patient connected to nasal cannula oxygen  Post-op Assessment: Report given to RN and Post -op Vital signs reviewed and stable  Post vital signs: Reviewed and stable  Last Vitals:  Vitals Value Taken Time  BP    Temp    Pulse    Resp    SpO2      Last Pain:  Vitals:   05/23/19 1452  TempSrc: Oral  PainSc: 0-No pain         Complications: No apparent anesthesia complications

## 2019-05-23 NOTE — Progress Notes (Signed)
Called patients daughter at [phone] to obtain consent to give patient blood transfusion.  Ginger Gleson, RN and Rudi Rummage, RN obtained verbal consent from Josette Shimabukuro, patients daughter, at number  [phone].

## 2019-05-23 NOTE — Progress Notes (Signed)
Patient received a total of 2 units of blood on my shift. First Unit transused at [phone] and second Unit transfused at 1252. Patient tolerated transfusions. Resting comfortably.

## 2019-05-23 NOTE — Anesthesia Preprocedure Evaluation (Signed)
Anesthesia Evaluation  Patient identified by MRN, date of birth, ID band Patient confused    Reviewed: Allergy & Precautions, NPO status , Patient's Chart, lab work & pertinent test results, reviewed documented beta blocker date and time   Airway Mallampati: III  TM Distance: >3 FB Neck ROM: Full    Dental no notable dental hx. (+) Dental Advisory Given   Pulmonary former smoker,    Pulmonary exam normal breath sounds clear to auscultation       Cardiovascular hypertension, Pt. on medications and Pt. on home beta blockers +CHF  + dysrhythmias + Valvular Problems/Murmurs MR  Rhythm:Regular Rate:Tachycardia  ECG: rate 82. Normal sinus rhythm Non-specific intra-ventricular conduction block  ECHO: 1. Left ventricular ejection fraction, by visual estimation, is 30 to 35%. The left ventricle has moderately decreased function. Left ventricular septal wall thickness was normal. There is mildly increased left ventricular hypertrophy.  2. Left ventricular diastolic function could not be evaluated pattern of LV diastolic filling.  3. Global right ventricle has normal systolic function.The right ventricular size is normal. No increase in right ventricular wall thickness.  4. Left atrial size was severely dilated.  5. Right atrial size was mildly dilated.  6. Trivial pericardial effusion is present.  7. The mitral valve has been repaired/replaced. Mild mitral valve regurgitation. Moderate mitral stenosis.  8. The tricuspid valve is normal in structure. Tricuspid valve regurgitation is mild.  9. The aortic valve is normal in structure. Aortic valve regurgitation was not visualized by color flow Doppler. 10. The pulmonic valve was not assessed. Pulmonic valve regurgitation was not assessed by color flow Doppler. 11. Aortic root could not be assessed. 12. Day 1 post MitraClip placement. Mitral regurgitation is mild. Transmitral gradients are  elevated at 7 mmHg. LVEF decreased from prior the procedure from 50-55% to 30-35% with paradoxical septal motion. No pericardial effusion. 13. Iatrogenic left to right shunting.   Neuro/Psych PSYCHIATRIC DISORDERS Anxiety Dementia negative neurological ROS     GI/Hepatic Neg liver ROS, GERD  Medicated and Controlled,  Endo/Other  negative endocrine ROS  Renal/GU CRFRenal disease     Musculoskeletal negative musculoskeletal ROS (+)   Abdominal (+) + obese,   Peds  Hematology  (+) anemia , HLD   Anesthesia Other Findings Bleeding AVMs.  Reproductive/Obstetrics                             Anesthesia Physical  Anesthesia Plan  ASA: IV and emergent  Anesthesia Plan: General   Post-op Pain Management:    Induction: Intravenous and Rapid sequence  PONV Risk Score and Plan: 2 and Propofol infusion and Treatment may vary due to age or medical condition  Airway Management Planned: Oral ETT  Additional Equipment: None  Intra-op Plan:   Post-operative Plan: Extubation in OR  Informed Consent: I have reviewed the patients History and Physical, chart, labs and discussed the procedure including the risks, benefits and alternatives for the proposed anesthesia with the patient or authorized representative who has indicated his/her understanding and acceptance.     Dental advisory given and Consent reviewed with POA  Plan Discussed with: CRNA  Anesthesia Plan Comments:         Anesthesia Quick Evaluation

## 2019-05-23 NOTE — Progress Notes (Signed)
Critical lab received of Hgb 5.8 from [place] in lab. Paged Dr. Ronnald Ramp with Internal Medicine and was informed pt is with [facility]. Paged [facility] on-call and was informed that ED resident put no note of findings or plan of care. [facility] on-call was unable to assist at this time. Pt has orders to transfuse 2 units RBC's, pt is confused consent has not been obtained. Multiple attempts have been made to family.Pt currently in no distress.  Will continue to monitor.

## 2019-05-23 NOTE — Progress Notes (Signed)
Called daughter for blood consent, no answer voicemail message left. Will attempt again at later time.

## 2019-05-23 NOTE — Progress Notes (Signed)
Patient ID: Hannah [name], female   DOB: 1946-01-10, 74 y.o.   MRN: [id number] Patient was admitted overnight for COVID-19 pneumonia along with symptomatic anemia with hemoglobin of 5.9.  Platelets transfusion has been ordered.  She has been started on remdesivir and Decadron.  She is a patient of Dr. Erin Hearing.  I spoke to Dr. Erin Hearing on phone on 05/23/2019 and patient will be transferred to his service/family medicine service.  His team will request GI consultation.  Hospitalist service will sign off.

## 2019-05-23 NOTE — Anesthesia Procedure Notes (Signed)
Procedure Name: Intubation Date/Time: 05/23/2019 3:11 PM Performed by: Inda Coke, CRNA Pre-anesthesia Checklist: Patient identified, Emergency Drugs available, Suction available and Patient being monitored Patient Re-evaluated:Patient Re-evaluated prior to induction Oxygen Delivery Method: Circle System Utilized Preoxygenation: Pre-oxygenation with 100% oxygen Induction Type: IV induction and Rapid sequence Laryngoscope Size: Mac and 4 Grade View: Grade I Tube type: Oral Tube size: 7.5 mm Number of attempts: 1 Airway Equipment and Method: Stylet and Oral airway Placement Confirmation: ETT inserted through vocal cords under direct vision,  positive ETCO2 and breath sounds checked- equal and bilateral Secured at: 22 cm Tube secured with: Tape Dental Injury: Teeth and Oropharynx as per pre-operative assessment  Comments: Elective rapid sequence due to covid + patient

## 2019-05-24 LAB — TYPE AND SCREEN
ABO/RH(D): O NEG
Antibody Screen: POSITIVE
DAT, IgG: POSITIVE
Donor AG Type: NEGATIVE
Donor AG Type: NEGATIVE
Unit division: 0
Unit division: 0

## 2019-05-24 LAB — CBC WITH DIFFERENTIAL/PLATELET
Abs Immature Granulocytes: 0.05 10*3/uL (ref 0.00–0.07)
Basophils Absolute: 0 10*3/uL (ref 0.0–0.1)
Basophils Relative: 0 %
Eosinophils Absolute: 0 10*3/uL (ref 0.0–0.5)
Eosinophils Relative: 0 %
HCT: 28.4 % — ABNORMAL LOW (ref 36.0–46.0)
Hemoglobin: 8.4 g/dL — ABNORMAL LOW (ref 12.0–15.0)
Immature Granulocytes: 0 %
Lymphocytes Relative: 14 %
Lymphs Abs: 1.6 10*3/uL (ref 0.7–4.0)
MCH: 27.5 pg (ref 26.0–34.0)
MCHC: 29.6 g/dL — ABNORMAL LOW (ref 30.0–36.0)
MCV: 93.1 fL (ref 80.0–100.0)
Monocytes Absolute: 0.8 10*3/uL (ref 0.1–1.0)
Monocytes Relative: 7 %
Neutro Abs: 9.1 10*3/uL — ABNORMAL HIGH (ref 1.7–7.7)
Neutrophils Relative %: 79 %
Platelets: 308 10*3/uL (ref 150–400)
RBC: 3.05 MIL/uL — ABNORMAL LOW (ref 3.87–5.11)
RDW: 17.5 % — ABNORMAL HIGH (ref 11.5–15.5)
WBC: 11.6 10*3/uL — ABNORMAL HIGH (ref 4.0–10.5)
nRBC: 0.5 % — ABNORMAL HIGH (ref 0.0–0.2)

## 2019-05-24 LAB — COMPREHENSIVE METABOLIC PANEL
ALT: 13 U/L (ref 0–44)
AST: 24 U/L (ref 15–41)
Albumin: 2.9 g/dL — ABNORMAL LOW (ref 3.5–5.0)
Alkaline Phosphatase: 87 U/L (ref 38–126)
Anion gap: 11 (ref 5–15)
BUN: 39 mg/dL — ABNORMAL HIGH (ref 8–23)
CO2: 22 mmol/L (ref 22–32)
Calcium: 8.8 mg/dL — ABNORMAL LOW (ref 8.9–10.3)
Chloride: 111 mmol/L (ref 98–111)
Creatinine, Ser: 1.37 mg/dL — ABNORMAL HIGH (ref 0.44–1.00)
GFR calc Af Amer: 43 mL/min — ABNORMAL LOW (ref >60)
GFR calc non Af Amer: 37 mL/min — ABNORMAL LOW (ref >60)
Glucose, Bld: 110 mg/dL — ABNORMAL HIGH (ref 70–99)
Potassium: 4.8 mmol/L (ref 3.5–5.1)
Sodium: 144 mmol/L (ref 135–145)
Total Bilirubin: 1.3 mg/dL — ABNORMAL HIGH (ref 0.3–1.2)
Total Protein: 6 g/dL — ABNORMAL LOW (ref 6.5–8.1)

## 2019-05-24 LAB — HEMOGLOBIN AND HEMATOCRIT, BLOOD
HCT: 28.9 % — ABNORMAL LOW (ref 36.0–46.0)
Hemoglobin: 8.6 g/dL — ABNORMAL LOW (ref 12.0–15.0)

## 2019-05-24 LAB — BPAM RBC
Blood Product Expiration Date: 202102092359
Blood Product Expiration Date: 202102092359
ISSUE DATE / TIME: 202102020841
ISSUE DATE / TIME: 202102021242
Unit Type and Rh: 9500
Unit Type and Rh: 9500

## 2019-05-24 NOTE — Evaluation (Signed)
Physical Therapy Evaluation Patient Details Name: Hannah [name] MRN: [id number] DOB: 1945-05-07 Today's Date: 05/24/2019   History of Present Illness  74 y.o. female with history of dementia, status post mitral valve clipping, chronic combined diastolic and systolic heart failure last EF measured was 30 to 35%, GI bleed in November 2020 requiring coagulation for angiodysplasia of the duodenum was brought to the ER 05/22/19 due to low hemoglobin with no obvious bleeding.  Patient also was recently diagnosed with Covid infection and pna. Received 2 units PRBC. 2/2 underwent small bowel enteroscopy with ablation of AVMs.   Clinical Impression   Pt admitted with above diagnosis. Patient states she lives alone, however family members rotate through staying with her "so I'm never alone." She felt slightly unsteady, although no observed imbalance with short distance ambulation and transfers. Primarily limited by elevated HR with activity (HR rest 99 HR max 134 bpm). Would like to further assess balance to determine if further PT or DME is indicated once HR controlled. RN made aware and stated she would let MD know.  Pt currently with functional limitations due to the deficits listed below (see PT Problem List). Pt will benefit from skilled PT to increase their independence and safety with mobility to allow discharge to the venue listed below.       Follow Up Recommendations No PT follow up;Supervision - Intermittent(as family provided PTA)    Equipment Recommendations  None recommended by PT    Recommendations for Other Services OT consult     Precautions / Restrictions Precautions Precautions: Fall      Mobility  Bed Mobility Overal bed mobility: Modified Independent             General bed mobility comments: incr time;   Transfers Overall transfer level: Needs assistance Equipment used: None Transfers: Sit to/from Stand Sit to Stand: Min guard;Supervision         General  transfer comment: first time standing (per pt) with close guarding for safety; no imbalance; stood from toilet no issues  Ambulation/Gait Ambulation/Gait assistance: Min guard Gait Distance (Feet): 15 Feet(25 ft;  (to/from toilet)) Assistive device: None Gait Pattern/deviations: Step-through pattern;Decreased stride length     General Gait Details: slightly hesitant; denied dizziness; lightly touching counter as walking by  [employer] Mobility    Modified Rankin (Stroke Patients Only)       Balance Overall balance assessment: No apparent balance deficits (not formally assessed)                                           Pertinent Vitals/Pain Pain Assessment: No/denies pain    Home Living Family/patient expects to be discharged to:: Private residence Living Arrangements: Other relatives Available Help at Discharge: Family;Available 24 hours/day(family members take turns staying with her) Type of Home: House Home Access: Stairs to enter;Ramped entrance Entrance Stairs-Rails: None Entrance Stairs-Number of Steps: 4 Home Layout: One level Home Equipment: Walker - 2 wheels;Wheelchair - manual Additional Comments: equipment was her husband's (bil amputee)    Prior Function Level of Independence: Needs assistance   Gait / Transfers Assistance Needed: no device inside; if goes out family pushes her in wheelchlair (so I don't slow them down)  ADL's / Homemaking Assistance Needed: does not drive; family shops for her  Hand Dominance   Dominant Hand: Right    Extremity/Trunk Assessment   Upper Extremity Assessment Upper Extremity Assessment: Defer to OT evaluation    Lower Extremity Assessment Lower Extremity Assessment: Generalized weakness    Cervical / Trunk Assessment Cervical / Trunk Assessment: Normal  Communication   Communication: No difficulties  Cognition Arousal/Alertness: Awake/alert Behavior During  Therapy: WFL for tasks assessed/performed Overall Cognitive Status: No family/caregiver present to determine baseline cognitive functioning                                 General Comments: not aware why in hospital or that she has virus      General Comments General comments (skin integrity, edema, etc.): on room air, sats 99-100%; HR 99-134 bpm therefore limited activity    Exercises     Assessment/Plan    PT Assessment Patient needs continued PT services  PT Problem List Decreased strength;Decreased activity tolerance;Decreased mobility;Decreased cognition;Decreased knowledge of precautions;Cardiopulmonary status limiting activity       PT Treatment Interventions DME instruction;Gait training;Functional mobility training;Therapeutic activities;Balance training;Cognitive remediation;Patient/family education    PT Goals (Current goals can be found in the Care Plan section)  Acute Rehab PT Goals Patient Stated Goal: return home when she's safe to do so PT Goal Formulation: With patient Time For Goal Achievement: 06/07/19 Potential to Achieve Goals: Good    Frequency Min 3X/week   Barriers to discharge        Co-evaluation               AM-PAC PT "6 Clicks" Mobility  Outcome Measure Help needed turning from your back to your side while in a flat bed without using bedrails?: None Help needed moving from lying on your back to sitting on the side of a flat bed without using bedrails?: None Help needed moving to and from a bed to a chair (including a wheelchair)?: A Little Help needed standing up from a chair using your arms (e.g., wheelchair or bedside chair)?: A Little Help needed to walk in hospital room?: A Little Help needed climbing 3-5 steps with a railing? : A Little 6 Click Score: 20    End of Session   Activity Tolerance: Treatment limited secondary to medical complications (Comment)(incr HR) Patient left: in chair;with call bell/phone within  reach;with chair alarm set Nurse Communication: Mobility status;Other (comment)(elevated HR) PT Visit Diagnosis: Difficulty in walking, not elsewhere classified (R26.2)    Time: 1610-9604 PT Time Calculation (min) (ACUTE ONLY): 24 min   Charges:   PT Evaluation $PT Eval Low Complexity: 1 Low           Arby Barrette, PT Pager [phone]   Rexanne Mano 05/24/2019, 10:05 AM

## 2019-05-24 NOTE — Progress Notes (Addendum)
   05/24/19 1344  Family/Significant Other Communication  Family/Significant Other Update Called;Updated (pt's daughter [name])    [name] asking for results of enteroscopy; resident paged to provide further update.

## 2019-05-24 NOTE — Progress Notes (Signed)
Pt complaining of back pain unrelieved by prn tylenol. Heat pads applied. Pt stated relief.

## 2019-05-24 NOTE — Progress Notes (Signed)
Family Medicine Teaching Service Daily Progress Note Intern Pager: [phone]  [name] Medical record number: [id number] Date of birth: 02-24-1946 Age: 74 y.o. Gender: female  Primary Care Provider: [name], MD Consultants: GI Code Status: Full  Pt Overview and Major Events to Date:  02/01-admitted  Assessment and Plan: Hannah [name] is a 74 y.o. female presenting with low Hgb. PMHx is significant for dementia, mitral regurgitation status post mitral valve clipping, HFrEF, recent GI bleed in November 2020.  Symptomatic Anemia Vital signs stable overnight.  Soft blood pressures.  Oxygen saturations 94-96% on room air.  Received 2 units PRBCs yesterday.  Repeat hemoglobin 8.7 status post transfusion. Hbg today 8.4. Small bowel enteroscopy yesterday revealed 3 angiodysplastic lesions with no bleeding found in the proximal jejunum,.  These AVMs were ablated with APC.  There was no evidence of active bleeding in the proximal small bowel.  On exam patient denies any dizziness, shortness of breath or signs of bleeding. -GI recommends regular diet, follow hemoglobin and transfuse as necessary and follow-up with Dr. Collene Mares in 4 weeks. -H&H at 1700 -Transfusion threshold > 7 -Cardiac monitoring -Continue Protonix 40 mg twice daily, switch to p.o. -Continue ferrous sulfate -Continue to hold ASA  -Monitor intake and output  COVID-19 Pneumonia Stable.  Vital signs stable overnight.  Oxygen saturations 94-96% on room air.  Continues to be afebrile.  Denies any SOB, chest pain, loss of taste or smell.  Lung exam clear to auscultation. -Monitor respiratory status -Continue proning 16 hours/day -Maintain oxygen saturations greater than 92% -Airborne/contact precautions -Follow-up CMP  HFrEF s/p mitral valve repair 01/2019 Stable.  No recorded urine output in the last 24 hours.  Net +1.71 liters.  Weight decreased 2.6 kg since admission.  Last echo shows EF  30-35% with normal functioning mitral clip.  On exam pt is euvolemic. -Monitor respiratory status -Continue daily weights -Strict I's and O's -Continue to hold home medications metoprolol 25 mg daily, Lasix 60 mg daily -check cardio re continuing ASA ([place] [facility])  AKI on CKD IIIb Improving. Cr today 1.39Baseline creatinine 1.3-1.4 -BMP in am  HTN Soft blood pressures, SBP 101/51 -109/76 -Continue to hold BP meds, restart when BP tolerates  H/o prolonged QTc ECG 02/02 shows QTC 504 -Avoid QTC prolonging medications.  Hypokalemia Labs pending -Replete as needed  Hyperglycemia Off steroids. Serum glucose 110 Likely elevated in the setting of steroid use. -monitor on daily CMP -Continue to monitor with am labs  HLD Stable -Continue atorvastatin 40 mg daily  Insomnia Pt reports slept well -Continue to hold Trazadone  Dementia Stable. Pleasant but poor historian at baseline -Call daughter prn and to provide updates  FEN/GI:  -Heart healthy/average carb PPx: SCDs  Disposition: SNF when medically stable.  Subjective:  No acute events overnight. Slept well. Feeling much better today. Denies any SOB, chest pain, dizziness,  Has voided but no BM yet.  Denies any bleeding.    Objective: Temp:  [96.7 F (35.9 C)-99.1 F (37.3 C)] 98.7 F (37.1 C) (02/03 0519) Pulse Rate:  [86-114] 98 (02/02 2136) Resp:  [18-28] 18 (02/02 1730) BP: (101-134)/(51-76) 101/51 (02/03 0519) SpO2:  [91 %-100 %] 96 % (02/02 2136) Physical Exam: General: Pleasant 74 y.o  lady in no apparent distress  Cardiovascular: RRR, no murmurs appreciated, good cap refill, no elevated JVD  Respiratory: CTAB,no wheezing, crackles noted Abdomen: soft, non tender,non distended Extremities: 5/5 strength in all extremities, trace lower extremity edema bilaterally  Laboratory: Recent Labs  Lab 05/22/19 1951 05/23/19 0250 05/23/19 1850  WBC 9.9 10.1 11.8*  HGB 5.9* 5.8* 8.7*  HCT  20.9* 20.4* 28.6*  PLT 345 358 308   Recent Labs  Lab 05/22/19 1951 05/23/19 0250  NA 136 136  K 3.3* 3.4*  CL 102 101  CO2 22 22  BUN 34* 39*  CREATININE 1.67* 1.59*  CALCIUM 8.6* 8.7*  PROT 6.5 6.6  BILITOT 0.5 0.5  ALKPHOS 91 100  ALT 11 11  AST 17 17  GLUCOSE 229* 124*     Imaging/Diagnostic Tests:   Carollee Leitz, MD 05/24/2019, 6:01 AM PGY-1, [facility] Intern pager: [phone], text pages welcome

## 2019-05-24 NOTE — Evaluation (Signed)
Occupational Therapy Evaluation Patient Details Name: Hannah [name] MRN: [id number] DOB: 1945/05/02 Today's Date: 05/24/2019    History of Present Illness 74 y.o. female with history of dementia, status post mitral valve clipping, chronic combined diastolic and systolic heart failure last EF measured was 30 to 35%, GI bleed in November 2020 requiring coagulation for angiodysplasia of the duodenum was brought to the ER 05/22/19 due to low hemoglobin with no obvious bleeding.  Patient also was recently diagnosed with Covid infection and pna. Received 2 units PRBC. 2/2 underwent small bowel enteroscopy with ablation of AVMs.    Clinical Impression   This 74 y/o female presents with the above. PTA pt reports independence with ADL and functional mobility. Pt completing room level functional mobility without AD and overall minguard assist this session; noted pt often seeking out single UE support on counter/items in room during mobility completion. Pt requiring minguard-minA for LB ADL, completing x2 standing grooming ADL with minguard assist and taking seated rest break between completion of tasks. DOE grossly 2-3/4 with prolonged standing activity, O2 sats stable on RA with max HR noted 127. Pt with notable STM deficits and disoriented to time, situation, though very pleasant and motivated to return to her PLOF. She will benefit from continued acute OT services to address current deficits, and recommend pt have 24hr supervision after initial discharge however do not anticipate pt will require follow up OT services. Will follow.     Follow Up Recommendations  No OT follow up;Supervision/Assistance - 24 hour    Equipment Recommendations  None recommended by OT           Precautions / Restrictions Precautions Precautions: Fall Restrictions Weight Bearing Restrictions: No      Mobility Bed Mobility Overal bed mobility: Modified Independent             General bed mobility comments:  incr time;   Transfers Overall transfer level: Needs assistance Equipment used: None Transfers: Sit to/from Stand Sit to Stand: Min guard;Supervision         General transfer comment: for safety and balance    Balance Overall balance assessment: Needs assistance Sitting-balance support: Feet supported Sitting balance-Leahy Scale: Good     Standing balance support: Single extremity supported;During functional activity Standing balance-Leahy Scale: Fair Standing balance comment: pt often seeking single UE support when mobilizing to/from bathroom                           ADL either performed or assessed with clinical judgement   ADL Overall ADL's : Needs assistance/impaired Eating/Feeding: Modified independent;Sitting   Grooming: Oral care;Wash/dry hands;Min guard;Standing Grooming Details (indicate cue type and reason): pt using UE support on sink counter Upper Body Bathing: Set up;Sitting   Lower Body Bathing: Minimal assistance;Sit to/from stand   Upper Body Dressing : Set up;Sitting   Lower Body Dressing: Minimal assistance;Sit to/from stand   Toilet Transfer: Minimal assistance;Ambulation Toilet Transfer Details (indicate cue type and reason): pt often seeking single UE support during mobility to bathroom Toileting- Clothing Manipulation and Hygiene: Supervision/safety;Sit to/from stand;Sitting/lateral lean Toileting - Clothing Manipulation Details (indicate cue type and reason): performing pericare and clothing management (gown and pants) without assist     Functional mobility during ADLs: Min guard;Minimal assistance General ADL Comments: pt requires cues to initiate rest breaks, noted pt reporting not needing to take a rest break but with increased DOE with activity progression      Vision  Perception     Praxis      Pertinent Vitals/Pain Pain Assessment: No/denies pain     Hand Dominance Right   Extremity/Trunk Assessment Upper  Extremity Assessment Upper Extremity Assessment: Generalized weakness   Lower Extremity Assessment Lower Extremity Assessment: Defer to PT evaluation   Cervical / Trunk Assessment Cervical / Trunk Assessment: Normal   Communication Communication Communication: No difficulties   Cognition Arousal/Alertness: Awake/alert Behavior During Therapy: WFL for tasks assessed/performed Overall Cognitive Status: No family/caregiver present to determine baseline cognitive functioning                                 General Comments: pt with notable STM deficits, often repeating same commens to therapist during session, disoriented to situation and time, not aware of Covid+ status   General Comments  max HR 127 with activity, O2 sats >90% on RA    Exercises     Shoulder Instructions      Home Living Family/patient expects to be discharged to:: Private residence Living Arrangements: Other relatives Available Help at Discharge: Family;Available 24 hours/day(family members take turns staying with her) Type of Home: House Home Access: Stairs to enter;Ramped entrance Entrance Stairs-Number of Steps: 4 Entrance Stairs-Rails: None Home Layout: One level     Bathroom Shower/Tub: Occupational psychologist: Handicapped height Bathroom Accessibility: Yes   Home Equipment: Environmental consultant - 2 wheels;Wheelchair - Brewing technologist - built in   Additional Comments: equipment was her husband's (bil amputee)      Prior Functioning/Environment Level of Independence: Needs assistance  Gait / Transfers Assistance Needed: no device inside; if goes out family pushes her in wheelchlair (so I don't slow them down) ADL's / Homemaking Assistance Needed: does not drive; family shops for her Communication / Swallowing Assistance Needed: no issues          OT Problem List: Decreased strength;Decreased range of motion;Decreased activity tolerance;Impaired balance (sitting and/or  standing);Decreased cognition;Decreased safety awareness;Decreased knowledge of use of DME or AE;Cardiopulmonary status limiting activity      OT Treatment/Interventions: Self-care/ADL training;Therapeutic exercise;Energy conservation;DME and/or AE instruction;Therapeutic activities;Cognitive remediation/compensation;Balance training;Patient/family education    OT Goals(Current goals can be found in the care plan section) Acute Rehab OT Goals Patient Stated Goal: return home when she's safe to do so OT Goal Formulation: With patient Time For Goal Achievement: 06/07/19 Potential to Achieve Goals: Good  OT Frequency: Min 2X/week   Barriers to D/C:            Co-evaluation              AM-PAC OT "6 Clicks" Daily Activity     Outcome Measure Help from another person eating meals?: None Help from another person taking care of personal grooming?: A Little Help from another person toileting, which includes using toliet, bedpan, or urinal?: A Little Help from another person bathing (including washing, rinsing, drying)?: A Little Help from another person to put on and taking off regular upper body clothing?: None Help from another person to put on and taking off regular lower body clothing?: A Little 6 Click Score: 20   End of Session Nurse Communication: Mobility status  Activity Tolerance: Patient tolerated treatment well Patient left: in bed;with call bell/phone within reach;with bed alarm set  OT Visit Diagnosis: Muscle weakness (generalized) (M62.81);Unsteadiness on feet (R26.81);Other symptoms and signs involving cognitive function  Time: 5329-9242 OT Time Calculation (min): 20 min Charges:  OT General Charges $OT Visit: 1 Visit OT Evaluation $OT Eval Moderate Complexity: 1 Mod  Lou Cal, OT [employer] Pager [phone] Office [place] 05/24/2019, 5:18 PM

## 2019-05-24 NOTE — Progress Notes (Signed)
Patient's daughter Hannah [name] updated about plan of care.All question answerer.

## 2019-05-24 NOTE — Consult Note (Signed)
   [facility] CM Inpatient Consult   05/24/2019  Hannah [name] 03-10-1946 [id number]    Patient's chart reviewed for 53% extreme high risk score of unplanned readmission, with 5 hospitalizations and 6 ED visits in the past 6 months, under her [employer] benefits. Patient had been previously outreached by [facility]) RN CM and [place].   Noted patient is in the [facility] [place].  Per MD note dated 05/23/19, patient was admitted for COVID-19 pneumonia along with symptomatic anemia with hemoglobin of 5.9- had blood transfusion and underwent enteroscopy 05/23/19.  Per PT evaluation note completed, no current recommendation for PT follow-up. Patient lives alone, however, family members rotate through staying with her.   Primary Care Provider is Dr. Talbert Cage with [facility] and this office also provides (TOC) transition ofcare follow up.  Plan:  Will notify[facility] Embedded[facility] care management(CM) of dischargeneeds and disposition. The Embedded CM team will follow for transition of care follow up.    For questions and additional information, please contact:   Jeovanni Heuring A. Chyla Schlender, BSN, RN-BC [facility] Liaison Cell: [phone]

## 2019-05-25 DIAGNOSIS — N183 Chronic kidney disease, stage 3 unspecified: Secondary | ICD-10-CM

## 2019-05-25 DIAGNOSIS — K922 Gastrointestinal hemorrhage, unspecified: Secondary | ICD-10-CM

## 2019-05-25 DIAGNOSIS — I5042 Chronic combined systolic (congestive) and diastolic (congestive) heart failure: Secondary | ICD-10-CM

## 2019-05-25 DIAGNOSIS — I10 Essential (primary) hypertension: Secondary | ICD-10-CM

## 2019-05-25 LAB — COMPREHENSIVE METABOLIC PANEL
ALT: 12 U/L (ref 0–44)
AST: 16 U/L (ref 15–41)
Albumin: 2.8 g/dL — ABNORMAL LOW (ref 3.5–5.0)
Alkaline Phosphatase: 90 U/L (ref 38–126)
Anion gap: 13 (ref 5–15)
BUN: 37 mg/dL — ABNORMAL HIGH (ref 8–23)
CO2: 20 mmol/L — ABNORMAL LOW (ref 22–32)
Calcium: 8.3 mg/dL — ABNORMAL LOW (ref 8.9–10.3)
Chloride: 109 mmol/L (ref 98–111)
Creatinine, Ser: 1.58 mg/dL — ABNORMAL HIGH (ref 0.44–1.00)
GFR calc Af Amer: 36 mL/min — ABNORMAL LOW (ref >60)
GFR calc non Af Amer: 31 mL/min — ABNORMAL LOW (ref >60)
Glucose, Bld: 97 mg/dL (ref 70–99)
Potassium: 4.3 mmol/L (ref 3.5–5.1)
Sodium: 142 mmol/L (ref 135–145)
Total Bilirubin: 1 mg/dL (ref 0.3–1.2)
Total Protein: 5.4 g/dL — ABNORMAL LOW (ref 6.5–8.1)

## 2019-05-25 LAB — CBC WITH DIFFERENTIAL/PLATELET
Abs Immature Granulocytes: 0.05 10*3/uL (ref 0.00–0.07)
Basophils Absolute: 0 10*3/uL (ref 0.0–0.1)
Basophils Relative: 0 %
Eosinophils Absolute: 0 10*3/uL (ref 0.0–0.5)
Eosinophils Relative: 0 %
HCT: 28.7 % — ABNORMAL LOW (ref 36.0–46.0)
Hemoglobin: 8.3 g/dL — ABNORMAL LOW (ref 12.0–15.0)
Immature Granulocytes: 1 %
Lymphocytes Relative: 17 %
Lymphs Abs: 1.7 10*3/uL (ref 0.7–4.0)
MCH: 27.6 pg (ref 26.0–34.0)
MCHC: 28.9 g/dL — ABNORMAL LOW (ref 30.0–36.0)
MCV: 95.3 fL (ref 80.0–100.0)
Monocytes Absolute: 0.7 10*3/uL (ref 0.1–1.0)
Monocytes Relative: 7 %
Neutro Abs: 7.4 10*3/uL (ref 1.7–7.7)
Neutrophils Relative %: 75 %
Platelets: 294 10*3/uL (ref 150–400)
RBC: 3.01 MIL/uL — ABNORMAL LOW (ref 3.87–5.11)
RDW: 17.2 % — ABNORMAL HIGH (ref 11.5–15.5)
WBC: 9.9 10*3/uL (ref 4.0–10.5)
nRBC: 0.3 % — ABNORMAL HIGH (ref 0.0–0.2)

## 2019-05-25 NOTE — NC FL2 (Signed)
[place] LEVEL OF CARE SCREENING TOOL     IDENTIFICATION  [name] Birthdate: April 26, 1945 Sex: female Admission Date (Current Location): 05/22/2019  [facility] and [place] Number:  Herbalist and Address:  [facility], [place] [address] [place], [place] [address]      Provider Number: [id number]  Attending Physician Name and Address:  Dickie La, MD  Relative Name and Phone Number:  Daughter, Hannah [name], [phone]    Current Level of Care: Hospital Recommended Level of Care: [place] Prior Approval Number:    Date Approved/Denied:   PASRR Number:    Discharge Plan: SNF    Current Diagnoses: Patient Active Problem List   Diagnosis Date Noted  . Pneumonia due to COVID-19 virus 05/23/2019  . Anemia due to acute blood loss 03/09/2019  . HLD (hyperlipidemia)   . Insomnia 03/01/2019  . Adverse effect of antiplatelet agent 02/22/2019  . GI bleed 02/21/2019  . Hypokalemia 02/03/2019  . S/P mitral valve repair   . CKD (chronic kidney disease) stage 3, GFR 30-59 ml/min   . Anemia of chronic disease   . Vitamin B12 deficiency 12/28/2018  . Chronic combined systolic and diastolic congestive heart failure ([place])   . AKI (acute kidney injury) ([place])   . Gastric bleed   . Dementia due to atherosclerosis with behavioral disturbance ([place]) 02/02/2018  . Gout 09/11/2015  . Symptomatic anemia 05/08/2015  . Essential hypertension 08/09/2008  . GERD 08/09/2008    Orientation RESPIRATION BLADDER Height & Weight     Self, Time, Place  Normal Continent Weight: 170 lb 14.4 oz (77.5 kg) Height:  5\' 2"  (157.5 cm)  BEHAVIORAL SYMPTOMS/MOOD NEUROLOGICAL BOWEL NUTRITION STATUS  Other (Comment)(none)   Continent Diet(see dc summary)  AMBULATORY STATUS COMMUNICATION OF NEEDS Skin   Limited Assist Verbally Normal, Other (Comment)(dry, see dc summary)                       Personal Care  Assistance Level of Assistance  Bathing, Feeding, Dressing, Total care Bathing Assistance: Limited assistance Feeding assistance: Independent Dressing Assistance: Limited assistance Total Care Assistance: Limited assistance   Functional Limitations Info  Sight, Hearing, Speech Sight Info: Adequate Hearing Info: Adequate Speech Info: Adequate    SPECIAL CARE FACTORS FREQUENCY  PT (By licensed PT), OT (By licensed OT)     PT Frequency: 4x OT Frequency: 4x            Contractures  Not Present    Additional Factors Info  Code Status, Allergies, Isolation Precautions Code Status Info: FULL Allergies Info: Plavix clopidogrel  COVID +           Current Medications (05/25/2019):  This is the current hospital active medication list Current Facility-Administered Medications  Medication Dose Route Frequency Provider Last Rate Last Admin  . acetaminophen (TYLENOL) tablet 650 mg  650 mg Oral Q6H PRN Carol Ada, MD   650 mg at 05/25/19 1027   Or  . acetaminophen (TYLENOL) suppository 650 mg  650 mg Rectal Q6H PRN Carol Ada, MD      . atorvastatin (LIPITOR) tablet 40 mg  40 mg Oral q1800 Carol Ada, MD   40 mg at 05/24/19 1659  . ferrous sulfate tablet 324 mg  324 mg Oral Daily Carol Ada, MD   324 mg at 05/25/19 0834  . guaiFENesin (ROBITUSSIN) 100 MG/5ML solution 400 mg  400 mg Oral Q6H Hung,  Saralyn Pilar, MD   400 mg at 05/24/19 2339  . pantoprazole (PROTONIX) injection 40 mg  40 mg Intravenous Q12H Carol Ada, MD   40 mg at 05/25/19 0834  . vitamin B-12 (CYANOCOBALAMIN) tablet 1,000 mcg  1,000 mcg Oral Daily Carol Ada, MD   1,000 mcg at 05/25/19 0834  . zinc sulfate capsule 220 mg  220 mg Oral Daily Carol Ada, MD   220 mg at 05/25/19 2715     Discharge Medications: Please see discharge summary for a list of discharge medications.  Relevant Imaging Results:  Relevant Lab Results:   Additional Information  SSN: [id number]  Ralph Dowdy,  Student-Social Work

## 2019-05-25 NOTE — TOC Initial Note (Signed)
Transition of Care [facility]) - Initial/Assessment Note    Patient Details  Name: Hannah [name] MRN: [id number] Date of Birth: 06/27/1945  [facility]) CM/SW Contact:    Benard Halsted, LCSW Phone Number: 05/25/2019, 11:53 AM  Clinical Narrative:                 Patient's daughter returned CSW's call regarding patient's discharge plan. CSW explained potential difficulty with [facility] not receiving payment and giving a 30 day notice to patient. Patient's daughter acknowledges that patient has a bill there but that they have been paying what they can. She states that she only wants patient to return to [facility] for a week to get through the [place] isolation period and then she is taking her home with her. CSW awaiting response from [facility].   Expected Discharge Plan: Skilled Nursing Facility Barriers to Discharge: SNF Pending bed offer   Patient Goals and CMS Choice Patient states their goals for this hospitalization and ongoing recovery are:: Return home CMS Medicare.gov Compare Post Acute Care list provided to:: Patient Represenative (must comment)(Daughter, [name]) Choice offered to / list presented to : Adult Children  Expected Discharge Plan and Services Expected Discharge Plan: [place] In-house Referral: Clinical Social Work   Post Acute Care Choice: [place] Living arrangements for the past 2 months: [place]                                      Prior Living Arrangements/Services Living arrangements for the past 2 months: [place] Lives with:: Facility Resident Patient language and need for interpreter reviewed:: Yes Do you feel safe going back to the place where you live?: Yes      Need for Family Participation in Patient Care: Yes (Comment) Care giver support system in place?: Yes (comment)   Criminal Activity/Legal Involvement Pertinent to Current Situation/Hospitalization: No -  Comment as needed  Activities of Daily Living Home Assistive Devices/Equipment: None ADL Screening (condition at time of admission) Patient's cognitive ability adequate to safely complete daily activities?: No Is the patient deaf or have difficulty hearing?: No Does the patient have difficulty seeing, even when wearing glasses/contacts?: No Does the patient have difficulty concentrating, remembering, or making decisions?: Yes Patient able to express need for assistance with ADLs?: Yes Does the patient have difficulty dressing or bathing?: No Independently performs ADLs?: Yes (appropriate for developmental age) Does the patient have difficulty walking or climbing stairs?: No Weakness of Legs: None Weakness of Arms/Hands: None  Permission Sought/Granted Permission sought to share information with : Facility Sport and exercise psychologist, Family Supports Permission granted to share information with : No  Share Information with NAME: [name]  Permission granted to share info w AGENCY: SNFs  Permission granted to share info w Relationship: Daughter  Permission granted to share info w Contact Information: [phone]  Emotional Assessment   Attitude/Demeanor/Rapport: Unable to Assess Affect (typically observed): Unable to Assess Orientation: : Oriented to Self, Oriented to Place Alcohol / Substance Use: Not Applicable Psych Involvement: No (comment)  Admission diagnosis:  Symptomatic anemia [D64.9] Anemia, unspecified type [D64.9] Pneumonia due to COVID-19 virus [U07.1, J12.82] Patient Active Problem List   Diagnosis Date Noted  . Pneumonia due to COVID-19 virus 05/23/2019  . Anemia due to acute blood loss 03/09/2019  . HLD (hyperlipidemia)   . Insomnia 03/01/2019  . Adverse effect of antiplatelet agent 02/22/2019  .  GI bleed 02/21/2019  . Hypokalemia 02/03/2019  . S/P mitral valve repair   . CKD (chronic kidney disease) stage 3, GFR 30-59 ml/min   . Anemia of chronic disease   .  Vitamin B12 deficiency 12/28/2018  . Chronic combined systolic and diastolic congestive heart failure ([place])   . AKI (acute kidney injury) ([place])   . Gastric bleed   . 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 Gout 09/11/2015  . Symptomatic anemia 05/08/2015  . Essential hypertension 08/09/2008  . GERD 08/09/2008   PCP:  [name], MD Pharmacy:  No Pharmacies Listed    Social Determinants of Health (SDOH) Interventions    Readmission Risk Interventions Readmission Risk Prevention Plan 02/03/2019  Transportation Screening Complete  Medication Review ([place]) Complete  PCP or Specialist appointment within 3-5 days of discharge Complete  HRI or [place] Complete  SW Recovery Care/Counseling Consult Complete  [place] Not Applicable  Some recent data might be hidden

## 2019-05-25 NOTE — Discharge Instructions (Signed)
Anemia  Anemia is a condition in which you do not have enough red blood cells or hemoglobin. Hemoglobin is a substance in red blood cells that carries oxygen. When you do not have enough red blood cells or hemoglobin (are anemic), your body cannot get enough oxygen and your organs may not work properly. As a result, you may feel very tired or have other problems. What are the causes? Common causes of anemia include:  Excessive bleeding. Anemia can be caused by excessive bleeding inside or outside the body, including bleeding from the intestine or from periods in women.  Poor nutrition.  Long-lasting (chronic) kidney, thyroid, and liver disease.  Bone marrow disorders.  Cancer and treatments for cancer.  HIV (human immunodeficiency virus) and AIDS (acquired immunodeficiency syndrome).  Treatments for HIV and AIDS.  Spleen problems.  Blood disorders.  Infections, medicines, and autoimmune disorders that destroy red blood cells. What are the signs or symptoms? Symptoms of this condition include:  Minor weakness.  Dizziness.  Headache.  Feeling heartbeats that are irregular or faster than normal (palpitations).  Shortness of breath, especially with exercise.  Paleness.  Cold sensitivity.  Indigestion.  Nausea.  Difficulty sleeping.  Difficulty concentrating. Symptoms may occur suddenly or develop slowly. If your anemia is mild, you may not have symptoms. How is this diagnosed? This condition is diagnosed based on:  Blood tests.  Your medical history.  A physical exam.  Bone marrow biopsy. Your health care provider may also check your stool (feces) for blood and may do additional testing to look for the cause of your bleeding. You may also have other tests, including:  Imaging tests, such as a CT scan or MRI.  Endoscopy.  Colonoscopy. How is this treated? Treatment for this condition depends on the cause. If you continue to lose a lot of blood, you may  need to be treated at a hospital. Treatment may include:  Taking supplements of iron, vitamin S31, or folic acid.  Taking a hormone medicine (erythropoietin) that can help to stimulate red blood cell growth.  Having a blood transfusion. This may be needed if you lose a lot of blood.  Making changes to your diet.  Having surgery to remove your spleen. Follow these instructions at home:  Take over-the-counter and prescription medicines only as told by your health care provider.  Take supplements only as told by your health care provider.  Follow any diet instructions that you were given.  Keep all follow-up visits as told by your health care provider. This is important. Contact a health care provider if:  You develop new bleeding anywhere in the body. Get help right away if:  You are very weak.  You are short of breath.  You have pain in your abdomen or chest.  You are dizzy or feel faint.  You have trouble concentrating.  You have bloody or black, tarry stools.  You vomit repeatedly or you vomit up blood. Summary  Anemia is a condition in which you do not have enough red blood cells or enough of a substance in your red blood cells that carries oxygen (hemoglobin).  Symptoms may occur suddenly or develop slowly.  If your anemia is mild, you may not have symptoms.  This condition is diagnosed with blood tests as well as a medical history and physical exam. Other tests may be needed.  Treatment for this condition depends on the cause of the anemia. This information is not intended to replace advice given to you by  your health care provider. Make sure you discuss any questions you have with your health care provider. Document Revised: 03/19/2017 Document Reviewed: 05/08/2016 Elsevier Patient Education  [place].

## 2019-05-25 NOTE — Progress Notes (Signed)
Physical Therapy Treatment Patient Details Name: Hannah [name] MRN: [id number] DOB: 03/15/1946 Today's Date: 05/25/2019    History of Present Illness 74 y.o. female with history of dementia, status post mitral valve clipping, chronic combined diastolic and systolic heart failure last EF measured was 30 to 35%, GI bleed in November 2020 requiring coagulation for angiodysplasia of the duodenum was brought to the ER 05/22/19 due to low hemoglobin with no obvious bleeding.  Patient also was recently diagnosed with Covid infection and pna. Received 2 units PRBC. 2/2 underwent small bowel enteroscopy with ablation of AVMs.     PT Comments    Pt admitted with above diagnosis. Pt was able to ambulate with min to min guard assist without device.  Cannot walk far without being very fatigued. Pt declined a second lap even after resting 10 min due to fatigue. Discussed energy conservation techniques and gave pt handout.  Pt currently with functional limitations due to endurance deficits. Pt will benefit from skilled PT to increase their independence and safety with mobility to allow discharge to the venue listed below.     Follow Up Recommendations  No PT follow up;Supervision - Intermittent(as family provided PTA)     Equipment Recommendations  None recommended by PT    Recommendations for Other Services OT consult     Precautions / Restrictions Precautions Precautions: Fall Restrictions Weight Bearing Restrictions: No    Mobility  Bed Mobility Overal bed mobility: Modified Independent             General bed mobility comments: incr time;   Transfers Overall transfer level: Needs assistance Equipment used: None Transfers: Sit to/from Stand Sit to Stand: Min guard;Supervision         General transfer comment: for safety and balance  Ambulation/Gait Ambulation/Gait assistance: Min guard Gait Distance (Feet): 45 Feet Assistive device: None Gait Pattern/deviations:  Step-through pattern;Decreased stride length   Gait velocity interpretation: <1.31 ft/sec, indicative of household ambulator General Gait Details: slightly hesitant; denied dizziness; lightly touching counter as walking by.  Pt walked to bathroom and then back to chair after making a lap in room.  Was going to have pt walk again but pt refused stating she was too tired to do another lap even after a 10 min rest.  Discussed energy conservation at length and gave the handout.    Stairs             Wheelchair Mobility    Modified Rankin (Stroke Patients Only)       Balance Overall balance assessment: Needs assistance Sitting-balance support: Feet supported;No upper extremity supported Sitting balance-Leahy Scale: Good     Standing balance support: Single extremity supported;During functional activity Standing balance-Leahy Scale: Fair Standing balance comment: pt often seeking single UE support when mobilizing to/from bathroom                            Cognition Arousal/Alertness: Awake/alert Behavior During Therapy: WFL for tasks assessed/performed Overall Cognitive Status: No family/caregiver present to determine baseline cognitive functioning                                 General Comments: pt with notable STM deficits, often repeating same commens to therapist during session, disoriented to situation and time, not aware of Covid+ status      Exercises General Exercises - Lower Extremity Ankle Circles/Pumps: AROM;Both;10 reps;Supine Long  Arc Quad: AROM;Both;10 reps;Seated    General Comments General comments (skin integrity, edema, etc.): Hr 95-112 bpm during session.        Pertinent Vitals/Pain Pain Assessment: Faces Faces Pain Scale: Hurts even more Pain Location: Joints Pain Descriptors / Indicators: Aching Pain Intervention(s): Limited activity within patient's tolerance;Monitored during session;Repositioned    Home Living                       Prior Function            PT Goals (current goals can now be found in the care plan section) Acute Rehab PT Goals Patient Stated Goal: return home when she's safe to do so Progress towards PT goals: Progressing toward goals    Frequency    Min 3X/week      PT Plan Current plan remains appropriate    Co-evaluation              AM-PAC PT "6 Clicks" Mobility   Outcome Measure  Help needed turning from your back to your side while in a flat bed without using bedrails?: None Help needed moving from lying on your back to sitting on the side of a flat bed without using bedrails?: None Help needed moving to and from a bed to a chair (including a wheelchair)?: A Little Help needed standing up from a chair using your arms (e.g., wheelchair or bedside chair)?: A Little Help needed to walk in hospital room?: A Little Help needed climbing 3-5 steps with a railing? : A Little 6 Click Score: 20    End of Session Equipment Utilized During Treatment: Gait belt Activity Tolerance: Patient limited by fatigue Patient left: in chair;with call bell/phone within reach;with chair alarm set Nurse Communication: Mobility status PT Visit Diagnosis: Difficulty in walking, not elsewhere classified (R26.2)     Time: 9201-0071 PT Time Calculation (min) (ACUTE ONLY): 23 min  Charges:  $Gait Training: 8-22 mins $Self Care/Home Management: 8-22                     Cardell Rachel W,PT Acute Rehabilitation Services Pager:  [phone]  Office:  [place] 05/25/2019, 2:00 PM

## 2019-05-25 NOTE — Progress Notes (Signed)
Spoke with social worker guarding the patient's status at [facility].  Social worker states she has not been able to reach [facility] this afternoon.  I asked if I could call them personally.  I reached out to the admissions person at [facility], and was unable to reach them on two attempt so far.  Patient was a resident of [facility] prior to her admission.  She was staying at [facility] at the time of her diagnosis with Covid before being admitted to the hospital for anemia unrelated to her Covid diagnosis.  Therefore I do not see a reason for her not to return to [facility] now that she is medically stable for discharge.

## 2019-05-25 NOTE — TOC Progression Note (Signed)
Transition of Care [facility]) - Progression Note    Patient Details  Name: Hannah [name] MRN: [id number] Date of Birth: 08-Dec-1945  Transition of Care [facility]) CM/SW [place], LCSW Phone Number: 05/25/2019, 4:07 PM  Clinical Narrative:    CSW received return call from [facility]. They report they have given patient a 30 day notice over 30 days ago and state they will need payment up front from patient's daughter before accepting patient. She will contact CSW back with a price, however, they do not have any isolation beds available.   CSW in contact with [facility] who reports they might have a bed tomorrow or Saturday and they would not require up front payment from family but will bill patient. CSW sent clinicals to insurance for approval.    Expected Discharge Plan: Skilled Nursing Facility Barriers to Discharge: SNF Pending bed offer  Expected Discharge Plan and Services Expected Discharge Plan: [place] In-house Referral: Clinical Social Work   Post Acute Care Choice: [place] Living arrangements for the past 2 months: [place]                                       Social Determinants of Health (SDOH) Interventions    Readmission Risk Interventions Readmission Risk Prevention Plan 02/03/2019  Transportation Screening Complete  Medication Review Press photographer) Complete  PCP or Specialist appointment within 3-5 days of discharge Complete  HRI or [place] Complete  SW Recovery Care/Counseling Consult Complete  [place] Not Applicable  Some recent data might be hidden

## 2019-05-25 NOTE — Progress Notes (Signed)
Family Medicine Teaching Service Daily Progress Note Intern Pager: [phone]  [name] Medical record number: [id number] Date of birth: [birth year] Age: 74 y.o. Gender: female  Primary Care Provider: [name], MD Consultants: GI Code Status: Full  Pt Overview and Major Events to Date:  02/01-admitted  Assessment and Plan: [name] is a 74 y.o. female presenting with low Hgb. PMHx is significant for dementia, mitral regurgitation status post mitral valve clipping, HFrEF, recent GI bleed in November 2020.  Symptomatic Anemia Vital signs stable.  Oxygen saturation 98%. Denies any dizziness, headaches, shortness of breath or chest pain.  Hemoglobin 8.3  -Follow-up GI with Dr. Collene Mares in 4 weeks -Monitor H&H transfusion threshold -Continue cardiac monitoring -Vital signs per floor unit -Continue Protonix 40 mg daily -Continue ferrous sulfate -ASA has been discontinued  COVID-19 Pneumonia Stable.  Oxygen saturation 98% on room air.  Continues to be afebrile.  Denies any shortness of breath, chest pain, loss of taste or smell.  Benign lung exam. -Monitor respiratory status -Maintain oxygen saturation greater than 94% -Airborne/contact precautions  HFrEF s/p mitral valve repair 01/2019 Stable.  Voiding well.  On exam patient appears euvolemic -Monitor respiratory -Continue to -Strict I's and O's -Continue to hold medications metoprolol and Lasix -Restart Lasix 40 mg daily on discharge -Continue metoprolol 25 on discharge  AKI on CKD IIIb Cr today 1.58 Baseline creatinine 1.3-1.4 -Encourage oral hydration -BMP in a.m.  HTN Continues to have soft blood pressures, SBP 110-120s  -Continue to hold BP meds, restart when BP tolerates  H/o prolonged QTc ECG 02/02 shows QTC 504 -Avoid QTC prolonging medications.  Hyperglycemia Resolved.  Serum glucose 97 -Continue to monitor  HLD Stable -Continue atorvastatin 40 mg  daily  Insomnia Slept well last night -Continue to hold Trazadone  Dementia Stable. Pleasant but poor historian at baseline -Call daughter prn and to provide updates  FEN/GI:  -Heart healthy/average carb PPx:  -SCDs  Disposition: SNF when medically stable.  Subjective:   Objective: Temp:  [97.8 F (36.6 C)-98.5 F (36.9 C)] 97.8 F (36.6 C) (02/04 0517) Pulse Rate:  [86-100] 100 (02/04 0517) Resp:  [17-18] 18 (02/04 0517) BP: (115-121)/(50-71) 115/65 (02/04 0517) SpO2:  [94 %-99 %] 98 % (02/04 0517) Weight:  [77.5 kg] 77.5 kg (02/04 0517) Physical Exam: General: 74 year old female in no apparent distress Cardiovascular: Regular rate and rhythm, no murmurs appreciated good cap refill, no elevated JVD Respiratory: Clear to auscultation bilaterally, no wheezing or crackles. Abdomen: Soft, nontender, nondistended, bowel sounds present Extremities trace lower extremity edema bilaterally  Laboratory: Recent Labs  Lab 05/23/19 0250 05/23/19 0250 05/23/19 1850 05/24/19 0813 05/24/19 1501  WBC 10.1  --  11.8* 11.6*  --   HGB 5.8*   < > 8.7* 8.4* 8.6*  HCT 20.4*   < > 28.6* 28.4* 28.9*  PLT 358  --  308 308  --    < > = values in this interval not displayed.   Recent Labs  Lab 05/23/19 0250 05/24/19 0813 05/25/19 0453  NA 136 144 142  K 3.4* 4.8 4.3  CL 101 111 109  CO2 22 22 20*  BUN 39* 39* 37*  CREATININE 1.59* 1.37* 1.58*  CALCIUM 8.7* 8.8* 8.3*  PROT 6.6 6.0* 5.4*  BILITOT 0.5 1.3* 1.0  ALKPHOS 100 87 90  ALT 11 13 12   AST 17 24 16   GLUCOSE 124* 110* 97     Imaging/Diagnostic Tests:   Carollee Leitz, MD 05/25/2019, 6:51  AM PGY-1, [place] Intern pager: [phone], text pages welcome

## 2019-05-25 NOTE — Anesthesia Postprocedure Evaluation (Signed)
Anesthesia Post Note  Patient: Hannah [name]  Procedure(s) Performed: ENTEROSCOPY (N/A ) HOT HEMOSTASIS (ARGON PLASMA COAGULATION/BICAP) (N/A )     Patient location during evaluation: PACU Anesthesia Type: General Level of consciousness: sedated and patient cooperative Pain management: pain level controlled Vital Signs Assessment: post-procedure vital signs reviewed and stable Respiratory status: spontaneous breathing Cardiovascular status: stable Anesthetic complications: no    Last Vitals:  Vitals:   05/25/19 0517 05/25/19 0841  BP: 115/65 (!) 127/54  Pulse: 100   Resp: 18   Temp: 36.6 C   SpO2: 98% 93%    Last Pain:  Vitals:   05/25/19 0933  TempSrc:   PainSc: [place]

## 2019-05-25 NOTE — Progress Notes (Signed)
Received call from lab that sample phlebotomy obtained for cbc w/ diff was not enough. Lab personnel to reorder cbc w/ diff.

## 2019-05-25 NOTE — Progress Notes (Signed)
Per discussion with SW Percell Locus pt and family in agreement with SNF placement and plans to DC backl to SNF tomorrow.   05/25/19 1600   PT - Assessment/Plan  Follow Up Recommendations SNF;Other (comment) (Per SW pt is returning to SNF)  Lavonia Dana, [place]  Pager [phone] Office [phone] 05/25/2019

## 2019-05-25 NOTE — Progress Notes (Signed)
Occupational Therapy Treatment Patient Details Name: Hannah [name] MRN: [id number] DOB: [birth year] Today's Date: 05/25/2019    History of present illness 74 y.o. female with history of dementia, status post mitral valve clipping, chronic combined diastolic and systolic heart failure last EF measured was 30 to 35%, GI bleed in November 2020 requiring coagulation for angiodysplasia of the duodenum was brought to the ER 05/22/19 due to low hemoglobin with no obvious bleeding.  Patient also was recently diagnosed with Covid infection and pna. Received 2 units PRBC. 2/2 underwent small bowel enteroscopy with ablation of AVMs.    OT comments  Pt progressing towards OT goals this session, able to ambulate min guard assist to bathroom (Pt continues to reach out for support in standing with SUE), toileting at supervision level including peri care. Pt was then able to perform sink level grooming and full sponge bath with cues for rest breaks and education on energy conservation. Evidence of STM deficits continue. Pt repeating about her son in [place] and wanting to be better to have her grandaughters stay this summer. Pt then returned supine in bed due to back pain and left with bed alarm on. Current OT plan remains appropriate.    Follow Up Recommendations  No OT follow up;Supervision/Assistance - 24 hour    Equipment Recommendations  None recommended by OT    Recommendations for Other Services      Precautions / Restrictions Precautions Precautions: Fall Restrictions Weight Bearing Restrictions: No       Mobility Bed Mobility Overal bed mobility: Modified Independent             General bed mobility comments: incr time;   Transfers Overall transfer level: Needs assistance Equipment used: None Transfers: Sit to/from Stand Sit to Stand: Min guard;Supervision         General transfer comment: for safety and balance    Balance Overall balance assessment: Needs  assistance Sitting-balance support: Feet supported Sitting balance-Leahy Scale: Good     Standing balance support: Single extremity supported;During functional activity Standing balance-Leahy Scale: Fair Standing balance comment: pt often seeking single UE support when mobilizing to/from bathroom                           ADL either performed or assessed with clinical judgement   ADL Overall ADL's : Needs assistance/impaired Eating/Feeding: Modified independent;Sitting   Grooming: Oral care;Wash/dry hands;Min guard;Standing Grooming Details (indicate cue type and reason): pt using UE support on sink counter Upper Body Bathing: Set up;Sitting   Lower Body Bathing: Min guard;Sitting/lateral leans;Sit to/from stand Lower Body Bathing Details (indicate cue type and reason): able to clean down legs and peri areas Upper Body Dressing : Set up;Sitting   Lower Body Dressing: Set up;Sit to/from stand Lower Body Dressing Details (indicate cue type and reason): managed socks and pants Toilet Transfer: Minimal assistance;Ambulation;Min guard Toilet Transfer Details (indicate cue type and reason): pt often seeking single UE support during mobility to bathroom Toileting- Clothing Manipulation and Hygiene: Supervision/safety;Sit to/from stand;Sitting/lateral lean Toileting - Clothing Manipulation Details (indicate cue type and reason): performing pericare and clothing management (gown and pants) without assist     Functional mobility during ADLs: Minimal assistance;Min guard General ADL Comments: pt requires cues to initiate rest breaks, noted pt reporting not needing to take a rest break but with increased DOE with activity progression      Vision       Perception  Praxis      Cognition Arousal/Alertness: Awake/alert Behavior During Therapy: WFL for tasks assessed/performed Overall Cognitive Status: No family/caregiver present to determine baseline cognitive  functioning                                 General Comments: pt with notable STM deficits, often repeating same commens to therapist during session, disoriented to situation and time, not aware of Covid+ status        Exercises     Shoulder Instructions       General Comments max HR was 132 during session    Pertinent Vitals/ Pain          Home Living                                          Prior Functioning/Environment              Frequency  Min 2X/week        Progress Toward Goals  OT Goals(current goals can now be found in the care plan section)  Progress towards OT goals: Progressing toward goals  Acute Rehab OT Goals Patient Stated Goal: return home when she's safe to do so OT Goal Formulation: With patient Time For Goal Achievement: 06/07/19 Potential to Achieve Goals: Good  Plan Discharge plan remains appropriate;Frequency remains appropriate    Co-evaluation                 AM-PAC OT "6 Clicks" Daily Activity     Outcome Measure   Help from another person eating meals?: None Help from another person taking care of personal grooming?: A Little Help from another person toileting, which includes using toliet, bedpan, or urinal?: A Little Help from another person bathing (including washing, rinsing, drying)?: A Little Help from another person to put on and taking off regular upper body clothing?: None Help from another person to put on and taking off regular lower body clothing?: A Little 6 Click Score: 20    End of Session    OT Visit Diagnosis: Muscle weakness (generalized) (M62.81);Unsteadiness on feet (R26.81);Other symptoms and signs involving cognitive function   Activity Tolerance Patient tolerated treatment well   Patient Left in bed;with call bell/phone within reach;with bed alarm set   Nurse Communication Mobility status        Time: 0935-1006 OT Time Calculation (min): 31  min  Charges: OT General Charges $OT Visit: 1 Visit OT Treatments $Self Care/Home Management : 23-37 mins  Jesse Sans OTR/L Acute Rehabilitation Services Pager: [phone] Office: [place] 05/25/2019, 11:07 AM

## 2019-05-26 DIAGNOSIS — Z743 Need for continuous supervision: Secondary | ICD-10-CM | POA: Diagnosis not present

## 2019-05-26 DIAGNOSIS — I10 Essential (primary) hypertension: Secondary | ICD-10-CM | POA: Diagnosis not present

## 2019-05-26 DIAGNOSIS — D638 Anemia in other chronic diseases classified elsewhere: Secondary | ICD-10-CM | POA: Diagnosis not present

## 2019-05-26 DIAGNOSIS — M2550 Pain in unspecified joint: Secondary | ICD-10-CM | POA: Diagnosis not present

## 2019-05-26 DIAGNOSIS — J1282 Pneumonia due to coronavirus disease 2019: Secondary | ICD-10-CM | POA: Diagnosis not present

## 2019-05-26 DIAGNOSIS — K922 Gastrointestinal hemorrhage, unspecified: Secondary | ICD-10-CM | POA: Diagnosis not present

## 2019-05-26 DIAGNOSIS — Z7401 Bed confinement status: Secondary | ICD-10-CM | POA: Diagnosis not present

## 2019-05-26 DIAGNOSIS — N179 Acute kidney failure, unspecified: Secondary | ICD-10-CM | POA: Diagnosis not present

## 2019-05-26 DIAGNOSIS — I5042 Chronic combined systolic (congestive) and diastolic (congestive) heart failure: Secondary | ICD-10-CM | POA: Diagnosis not present

## 2019-05-26 DIAGNOSIS — R531 Weakness: Secondary | ICD-10-CM | POA: Diagnosis not present

## 2019-05-26 DIAGNOSIS — Z9889 Other specified postprocedural states: Secondary | ICD-10-CM | POA: Diagnosis not present

## 2019-05-26 DIAGNOSIS — N183 Chronic kidney disease, stage 3 unspecified: Secondary | ICD-10-CM | POA: Diagnosis not present

## 2019-05-26 DIAGNOSIS — G4700 Insomnia, unspecified: Secondary | ICD-10-CM | POA: Diagnosis not present

## 2019-05-26 DIAGNOSIS — I672 Cerebral atherosclerosis: Secondary | ICD-10-CM | POA: Diagnosis not present

## 2019-05-26 DIAGNOSIS — K219 Gastro-esophageal reflux disease without esophagitis: Secondary | ICD-10-CM | POA: Diagnosis not present

## 2019-05-26 DIAGNOSIS — D649 Anemia, unspecified: Secondary | ICD-10-CM | POA: Diagnosis not present

## 2019-05-26 LAB — COMPREHENSIVE METABOLIC PANEL
ALT: 13 U/L (ref 0–44)
AST: 16 U/L (ref 15–41)
Albumin: 3.1 g/dL — ABNORMAL LOW (ref 3.5–5.0)
Alkaline Phosphatase: 96 U/L (ref 38–126)
Anion gap: 8 (ref 5–15)
BUN: 21 mg/dL (ref 8–23)
CO2: 22 mmol/L (ref 22–32)
Calcium: 8.2 mg/dL — ABNORMAL LOW (ref 8.9–10.3)
Chloride: 111 mmol/L (ref 98–111)
Creatinine, Ser: 1.07 mg/dL — ABNORMAL HIGH (ref 0.44–1.00)
GFR calc Af Amer: 58 mL/min — ABNORMAL LOW (ref >60)
GFR calc non Af Amer: 50 mL/min — ABNORMAL LOW (ref >60)
Glucose, Bld: 104 mg/dL — ABNORMAL HIGH (ref 70–99)
Potassium: 4.2 mmol/L (ref 3.5–5.1)
Sodium: 141 mmol/L (ref 135–145)
Total Bilirubin: 1.4 mg/dL — ABNORMAL HIGH (ref 0.3–1.2)
Total Protein: 6.1 g/dL — ABNORMAL LOW (ref 6.5–8.1)

## 2019-05-26 LAB — CBC WITH DIFFERENTIAL/PLATELET
Abs Immature Granulocytes: 0.04 10*3/uL (ref 0.00–0.07)
Basophils Absolute: 0 10*3/uL (ref 0.0–0.1)
Basophils Relative: 0 %
Eosinophils Absolute: 0 10*3/uL (ref 0.0–0.5)
Eosinophils Relative: 0 %
HCT: 32.9 % — ABNORMAL LOW (ref 36.0–46.0)
Hemoglobin: 9.6 g/dL — ABNORMAL LOW (ref 12.0–15.0)
Immature Granulocytes: 1 %
Lymphocytes Relative: 18 %
Lymphs Abs: 1.4 10*3/uL (ref 0.7–4.0)
MCH: 27.4 pg (ref 26.0–34.0)
MCHC: 29.2 g/dL — ABNORMAL LOW (ref 30.0–36.0)
MCV: 94 fL (ref 80.0–100.0)
Monocytes Absolute: 0.5 10*3/uL (ref 0.1–1.0)
Monocytes Relative: 7 %
Neutro Abs: 5.5 10*3/uL (ref 1.7–7.7)
Neutrophils Relative %: 74 %
Platelets: 295 10*3/uL (ref 150–400)
RBC: 3.5 MIL/uL — ABNORMAL LOW (ref 3.87–5.11)
RDW: 16.5 % — ABNORMAL HIGH (ref 11.5–15.5)
WBC: 7.4 10*3/uL (ref 4.0–10.5)
nRBC: 0.3 % — ABNORMAL HIGH (ref 0.0–0.2)

## 2019-05-26 MED ORDER — METOPROLOL SUCCINATE ER 25 MG PO TB24
25.0000 mg | ORAL_TABLET | Freq: Every day | ORAL | Status: DC
  Administered 2019-05-26: 25 mg via ORAL
  Filled 2019-05-26: qty 1

## 2019-05-26 MED ORDER — FUROSEMIDE 40 MG PO TABS: 40.0000 mg | ORAL_TABLET | Freq: Every day | ORAL | 0 refills | Status: DC

## 2019-05-26 MED ORDER — PANTOPRAZOLE SODIUM 40 MG PO TBEC: 40.0000 mg | DELAYED_RELEASE_TABLET | Freq: Every day | ORAL | Status: DC

## 2019-05-26 NOTE — Progress Notes (Signed)
Spoke with son Vicente Males and informed him of recent events current state and plans for discharge. He was appreciative

## 2019-05-26 NOTE — Consult Note (Signed)
   [facility] Inpatient Consult   05/26/2019  Hannah [name] 09/14/1945 [id number]    Follow-Up Note:  Following disposition for this [facility] patient with extreme high risk score for unplanned readmission. [facility] in [facility].  Transition of care SW note reveals that patient is ready for discharge to skilled nursing facility (SNF- [facility]) today; transport arranged.  Will update [employer] coach of discharge disposition to SNF.   For questions, please call:  Edwena Felty A. Marquon Alcala, BSN, RN-BC [facility] Liaison Cell: [phone]

## 2019-05-26 NOTE — Progress Notes (Signed)
Physical Therapy Treatment Patient Details Name: Hannah [name] MRN: [id number] DOB: 13-Mar-1946 Today's Date: 05/26/2019    History of Present Illness 74 y.o. female with history of dementia, status post mitral valve clipping, chronic combined diastolic and systolic heart failure last EF measured was 30 to 35%, GI bleed in November 2020 requiring coagulation for angiodysplasia of the duodenum was brought to the ER 05/22/19 due to low hemoglobin with no obvious bleeding.  Patient also was recently diagnosed with Covid infection and pna. Received 2 units PRBC. 2/2 underwent small bowel enteroscopy with ablation of AVMs.     PT Comments    Pt admitted with above diagnosis. Pt was able to ambulate in hallway today with min guard assist as she drifts at times and needs steadying assist.  Pt sats 100% on RA.  Pt incr distance. Continues to be limited by cognitive deficits.  Pt repeats things over and over and is confused intermittently. Agree with SNF.  Pt currently with functional limitations due to balance and endurance deficits. Pt will benefit from skilled PT to increase their independence and safety with mobility to allow discharge to the venue listed below.     Follow Up Recommendations  SNF;Other (comment)(Per SW pt is returning to SNF)     Equipment Recommendations  None recommended by PT    Recommendations for Other Services       Precautions / Restrictions Precautions Precautions: Fall Restrictions Weight Bearing Restrictions: No    Mobility  Bed Mobility               General bed mobility comments: in chair on arrival. Asked if she could get dressed therefore PT gave her her clothes and pt got dressed without assist.   Transfers Overall transfer level: Needs assistance Equipment used: None Transfers: Sit to/from Stand Sit to Stand: Supervision         General transfer comment: for safety and balance  Ambulation/Gait Ambulation/Gait assistance: Min guard Gait  Distance (Feet): 200 Feet Assistive device: None Gait Pattern/deviations: Step-through pattern;Decreased stride length;Drifts right/left   Gait velocity interpretation: <1.31 ft/sec, indicative of household ambulator General Gait Details: slightly hesitant;  lightly touching counter as walking by.  Pt walked to bathroom and urinated. Then walked down hall with min guard as pt occasionally drifts to right with ambulation.   Sats 100% while ambulating.  Discussed energy conservation at length again.    Stairs             Wheelchair Mobility    Modified Rankin (Stroke Patients Only)       Balance Overall balance assessment: Needs assistance Sitting-balance support: Feet supported;No upper extremity supported Sitting balance-Leahy Scale: Good     Standing balance support: Single extremity supported;During functional activity Standing balance-Leahy Scale: Fair Standing balance comment: pt often seeking single UE support when mobilizing to/from bathroom                            Cognition Arousal/Alertness: Awake/alert Behavior During Therapy: WFL for tasks assessed/performed Overall Cognitive Status: No family/caregiver present to determine baseline cognitive functioning                                 General Comments: pt with notable STM deficits, often repeating same commens to therapist during session, disoriented to situation and time, not aware of Covid+ status      Exercises  General Exercises - Lower Extremity Ankle Circles/Pumps: AROM;Both;10 reps;Supine Long Arc Quad: AROM;Both;10 reps;Seated    General Comments        Pertinent Vitals/Pain Pain Assessment: Faces Faces Pain Scale: Hurts even more Pain Location: Joints Pain Descriptors / Indicators: Aching Pain Intervention(s): Limited activity within patient's tolerance;Monitored during session;Repositioned    Home Living                      Prior Function             PT Goals (current goals can now be found in the care plan section) Acute Rehab PT Goals Patient Stated Goal: return home when she's safe to do so Progress towards PT goals: Progressing toward goals    Frequency    Min 3X/week      PT Plan Current plan remains appropriate    Co-evaluation              AM-PAC PT "6 Clicks" Mobility   Outcome Measure  Help needed turning from your back to your side while in a flat bed without using bedrails?: None Help needed moving from lying on your back to sitting on the side of a flat bed without using bedrails?: None Help needed moving to and from a bed to a chair (including a wheelchair)?: A Little Help needed standing up from a chair using your arms (e.g., wheelchair or bedside chair)?: A Little Help needed to walk in hospital room?: A Little Help needed climbing 3-5 steps with a railing? : A Little 6 Click Score: 20    End of Session Equipment Utilized During Treatment: Gait belt Activity Tolerance: Patient limited by fatigue Patient left: in chair;with call bell/phone within reach;with chair alarm set Nurse Communication: Mobility status PT Visit Diagnosis: Difficulty in walking, not elsewhere classified (R26.2)     Time: 1435-1455 PT Time Calculation (min) (ACUTE ONLY): 20 min  Charges:  $Gait Training: 8-22 mins                     Westen Dinino W,PT Acute Rehabilitation Services Pager:  [phone]  Office:  [place] 05/26/2019, 4:14 PM

## 2019-05-26 NOTE — Progress Notes (Signed)
RN received call from unknown caller who identified herself as the "other daughter", stated that that her and her brother would like to come pick up the patient and have home health set up. RN informed the caller that the patient is set to be transferred to a facility shortly, and we are only waiting on transport. The caller stated that [name] is not the POA, and that the caller will contact her attorney.

## 2019-05-26 NOTE — Progress Notes (Addendum)
Attempted report to RN at [facility]. Left message with callback number. Will try again later.   Gave report to [facility], [place]

## 2019-05-26 NOTE — Progress Notes (Signed)
Family Medicine Teaching Service Daily Progress Note Intern Pager: [phone]  [name] Medical record number: [id number] Date of birth: 1945-07-29 Age: 74 y.o. Gender: female  Primary Care Provider: [name], MD Consultants: GI Code Status: Full  Pt Overview and Major Events to Date:  02/01-admitted  Assessment and Plan: Hannah [name] is a 74 y.o. female presenting with low Hgb. PMHx is significant for dementia, mitral regurgitation status post mitral valve clipping, HFrEF, recent GI bleed in November 2020.  Symptomatic Anemia Resolved.  Vital signs stable overnight.  Denies any dizziness, shortness of breath or chest pain.  Hemoglobin stable -Follow-up GI with Dr. Collene Mares 4 weeks from 05/23/19 -Vital signs per unit -Continue ferrous sulfate -ASA has been can be discontinued indefinitely per cardiology  COVID-19 Pneumonia Stable.  O2 sats 100% on room air.  Continues to be afebrile.  Denies any shortness of breath or chest pain.  Lung exam benign -Monitor respiratory status -Maintain oxygen saturations greater than 94% -Continue airborne and contact precautions  HFrEF s/p mitral valve repair 01/2019 Stable.  Patient reports voiding well.  Appears euvolemic on exam -Monitor respiratory status -Continue intake and output -Continue daily weights -Restart Lasix 40 mg on discharge -Restart metoprolol 25 mg   AKI on CKD IIIb Creatinine baseline 1.3-1.4.  Labs pending -Encourage oral hydration -Follow-up labs  HTN  SBP 130s -Restart metoprolol 25 mg daily  H/o prolonged QTc ECG 02/02 shows QTC 504 -Avoid QTC prolonging medications.  HLD Stable -Continue atorvastatin 40 mg daily  Insomnia Slept well last night -Continue to hold Trazadone  Dementia Stable. Pleasant but poor historian at baseline -Call daughter prn and to provide updates  FEN/GI:  -Heart healthy/average carb PPx:  -SCDs  Disposition: Medically stable for  SNF when bed available  Subjective:  Uneventful night.  Patient reports he feels good.  Denies any chest pain, shortness of breath.  Appetite good.    Objective: Temp:  [97.8 F (36.6 C)-98.1 F (36.7 C)] 97.8 F (36.6 C) (02/05 0421) Pulse Rate:  [101-102] 102 (02/05 0421) Resp:  [18] 18 (02/05 0421) BP: (127-138)/(54-80) 136/80 (02/05 0421) SpO2:  [93 %-100 %] 100 % (02/05 0421) Weight:  [73.7 kg] 73.7 kg (02/05 0421) Physical Exam: General: 74 year old female in no acute distress Cardiovascular: Regular rate and rhythm, no murmurs or gallops appreciated Respiratory: Clear to auscultation bilaterally, no wheezes, no crackles noted Abdomen: Soft, nontender, nondistended, bowel sounds present Extremities: No lower extremity edema Laboratory: Recent Labs  Lab 05/23/19 1850 05/23/19 1850 05/24/19 0813 05/24/19 1501 05/25/19 0801  WBC 11.8*  --  11.6*  --  9.9  HGB 8.7*   < > 8.4* 8.6* 8.3*  HCT 28.6*   < > 28.4* 28.9* 28.7*  PLT 308  --  308  --  294   < > = values in this interval not displayed.   Recent Labs  Lab 05/23/19 0250 05/24/19 0813 05/25/19 0453  NA 136 144 142  K 3.4* 4.8 4.3  CL 101 111 109  CO2 22 22 20*  BUN 39* 39* 37*  CREATININE 1.59* 1.37* 1.58*  CALCIUM 8.7* 8.8* 8.3*  PROT 6.6 6.0* 5.4*  BILITOT 0.5 1.3* 1.0  ALKPHOS 100 87 90  ALT 11 13 12   AST 17 24 16   GLUCOSE 124* 110* 97     Imaging/Diagnostic Tests:   Carollee Leitz, MD 05/26/2019, 6:25 AM PGY-1, [place] Intern pager: [phone], text pages welcome

## 2019-05-26 NOTE — Progress Notes (Signed)
RN spoke with [name] who confirmed that she is POA.

## 2019-05-26 NOTE — TOC Transition Note (Signed)
Transition of Care [facility]) - CM/SW Discharge Note   Patient Details  Name: Hannah [name] MRN: [id number] Date of Birth: [birth year]  Transition of Care [facility]) CM/SW Contact:  Arvella Merles, LCSW Phone Number: 05/26/2019, 1:48 PM   Clinical Narrative:    Patient will DC to: [facility] Anticipated DC date: 05/26/2019 Family notified: [name], daughter Transport by: Corey Harold   Per MD patient ready for DC to [facility]. RN, patient, patient's family, and facility notified of DC. Discharge Summary and FL2 sent to facility. RN to call report prior to discharge [phone]). DC packet on chart. Ambulance transport requested for patient.   CSW will sign off for now as social work intervention is no longer needed. Please consult [place] again if new needs arise.      Barriers to Discharge: SNF Pending bed offer   Patient Goals and CMS Choice Patient states their goals for this hospitalization and ongoing recovery are:: Return home CMS Medicare.gov Compare Post Acute Care list provided to:: Patient Represenative (must comment)(Daughter, [name]) Choice offered to / list presented to : Adult Children  Discharge Placement                       Discharge Plan and Services In-house Referral: Clinical Social Work   Post Acute Care Choice: [place]                               Social Determinants of Health (SDOH) Interventions     Readmission Risk Interventions Readmission Risk Prevention Plan 02/03/2019  Transportation Screening Complete  Medication Review Press photographer) Complete  PCP or Specialist appointment within 3-5 days of discharge Complete  HRI or [place] Complete  SW Recovery Care/Counseling Consult Complete  [place] Not Applicable  Some recent data might be hidden

## 2019-05-26 NOTE — Progress Notes (Signed)
PTAR to transport to facility, IV removed. Discharge paperwork and instructions given. No new questions or concerns.

## 2019-05-26 NOTE — TOC Progression Note (Signed)
Transition of Care [facility]) - Progression Note    Patient Details  Name: KALANY DIEKMANN MRN: [id number] Date of Birth: April 24, 1945  Transition of Care [facility]) CM/SW [place], LCSW Phone Number: 05/26/2019, 3:31 PM  Clinical Narrative:    Insurance auth received and given to [facility]: #[id number].   Expected Discharge Plan: [place] Barriers to Discharge: No Barriers Identified  Expected Discharge Plan and Services Expected Discharge Plan: [place] In-house Referral: Clinical Social Work   Post Acute Care Choice: [place] Living arrangements for the past 2 months: [place] Expected Discharge Date: 05/26/19                                     Social Determinants of Health (SDOH) Interventions    Readmission Risk Interventions Readmission Risk Prevention Plan 02/03/2019  Transportation Screening Complete  Medication Review Press photographer) Complete  PCP or Specialist appointment within 3-5 days of discharge Complete  HRI or [place] Complete  SW Recovery Care/Counseling Consult Complete  [place] Not Applicable  Some recent data might be hidden

## 2019-05-26 NOTE — Care Management Important Message (Signed)
Important Message  Patient Details  Name: Hannah [name] MRN: [id number] Date of Birth: 06/10/1945   Medicare Important Message Given:  Yes - Important Message mailed due to current National Emergency  Verbal consent obtained due to current National Emergency  Relationship to patient: Child Contact Name: Hayleen Clinkscales Call Date: 05/26/19  Time: 2574 Phone: [phone] Outcome: Spoke with contact Important Message mailed to: Patient address on file    [name] 05/26/2019, 2:55 PM

## 2019-05-26 NOTE — TOC Progression Note (Addendum)
Transition of Care [facility]) - Progression Note    Patient Details  Name: Hannah [name] MRN: [id number] Date of Birth: 27-Dec-1945  Transition of Care [facility]) CM/SW [place], LCSW Phone Number: 05/26/2019, 5:45 PM  Clinical Narrative:    CSW notified by RN that patient's other daughter (not listed on Facesheet) has called to say that she is POA and she is taking the patient home. [name] reports she has POA paperwork that [facility] should have on file and that her siblings are not to make decisions as she wants patient to go to [facility] until out of COVID isolation. CSW unable to find paperwork in Epic. [name] going to send CSW paperwork. TOC supervisor, [name], attempting to contact [place] to see if they had any POA on file while pt was there. CSW left voicemail for [facility] as well to see if they have paperwork documented.   6:30pm-CSW received POA paperwork from [place] with notary stamp [name] [name] [place] on 11/17/18). Printed paperwork to the floor printer for medical records to keep on file. Patient able to discharge to [facility]. [facility] does not have anything on file for patient.   Expected Discharge Plan: [place] Barriers to Discharge: No Barriers Identified  Expected Discharge Plan and Services Expected Discharge Plan: [place] In-house Referral: Clinical Social Work   Post Acute Care Choice: [place] Living arrangements for the past 2 months: [place] Expected Discharge Date: 05/26/19                                     Social Determinants of Health (SDOH) Interventions    Readmission Risk Interventions Readmission Risk Prevention Plan 02/03/2019  Transportation Screening Complete  Medication Review Press photographer) Complete  PCP or Specialist appointment within 3-5 days of discharge Complete  HRI or [place] Complete  SW Recovery Care/Counseling Consult  Complete  [place] Not Applicable  Some recent data might be hidden

## 2019-05-28 LAB — CULTURE, BLOOD (ROUTINE X 2)
Culture: NO GROWTH
Culture: NO GROWTH
Special Requests: ADEQUATE

## 2019-05-29 DIAGNOSIS — N183 Chronic kidney disease, stage 3 unspecified: Secondary | ICD-10-CM | POA: Diagnosis not present

## 2019-05-29 DIAGNOSIS — K922 Gastrointestinal hemorrhage, unspecified: Secondary | ICD-10-CM | POA: Diagnosis not present

## 2019-05-29 DIAGNOSIS — D638 Anemia in other chronic diseases classified elsewhere: Secondary | ICD-10-CM | POA: Diagnosis not present

## 2019-06-06 DIAGNOSIS — N183 Chronic kidney disease, stage 3 unspecified: Secondary | ICD-10-CM | POA: Diagnosis not present

## 2019-06-06 DIAGNOSIS — K922 Gastrointestinal hemorrhage, unspecified: Secondary | ICD-10-CM | POA: Diagnosis not present

## 2019-06-06 DIAGNOSIS — D638 Anemia in other chronic diseases classified elsewhere: Secondary | ICD-10-CM | POA: Diagnosis not present

## 2019-06-07 ENCOUNTER — Telehealth: Payer: Self-pay | Admitting: Family Medicine

## 2019-06-07 DIAGNOSIS — G4700 Insomnia, unspecified: Secondary | ICD-10-CM

## 2019-06-07 MED ORDER — RAMELTEON 8 MG PO TABS: 8.0000 mg | ORAL_TABLET | Freq: Every day | ORAL | 0 refills | Status: DC

## 2019-06-07 NOTE — Telephone Encounter (Signed)
Sent refill for remeron to pharmacy They will make an appointment for her mom in the next 2 weeks

## 2019-06-07 NOTE — Telephone Encounter (Signed)
Call from son Hannah [name]  Hannah [name] is staying with their sister Hannah [name] sp? This is disgreement among her children as to best care for mom

## 2019-06-12 NOTE — Progress Notes (Signed)
[facility] [place]                                       Cardiology Office Note    Date:  06/14/2019   ID:  Hannah [name], Hannah [name] 01-03-1946, MRN [id number]  PCP:  [name], MD  Cardiologist: Dr. Johnsie Cancel  CC: MR/Mitral Clip   History of Present Illness:  Hannah [name] is a 74 y.o. female with a history of chroni diastolic CHF, HTN, dementia, GI bleeding,anxiety and severe mitral regurgitation s/p MitraClip (02/02/19) who presents to clinic for follow up. She has significant memory loss issues no longer driving GI bleeding with meseneric artery coil embolization Had multiple hospitalizations for CHF beginning July 2020  She was evaluated by the multidisciplinary valve team and underwent successful transcatheter edge-to-edge mitral valve repair with a single MitraClip NTR placed A2/P2, reducing MR from severe at baseline to 1-2+, LA V wave reduced by 50% from baseline.  Post op echo showed EF 30-35%, with mild residual MR and moderate MS (transmitral gradient 7 mm HG). Given reduction in EF, her lopressor was changed to Toprol Xl 25mg  daily and Losartan 25mg  daily was added. She was discharged on aspirin and plavix.   Readmitted from 11/3-11/10/20 for symptomatic anemia 2/2 GI bleed. Hg down to 5.7. She was transfused. FOBT positive. Enteroscopy was performed on 11/6 showing normal esophagus, normal stomach, normal duodenum; however, multiple angiodysplastic lesions with stigmata of recent bleeding were found in the proximal jejunum. Coagulation was successful.  She was continued on Protonix 40mg  daily. Patient's hemoglobin remained around 11 for 3 days prior to discharge. She had AKI and losartan discontinued and lasix decreased to 40mg  daily. Plavix was discontinued and she was discharged to [facility]. Daughter now living with her ? Another sibling had emptied her bank account  BP still too low for ARB today Pulse is elevated  in high 90's but ECG shows SR with PAC;s/ PVC;s    Past Medical History:  Diagnosis Date  . Anemia   . Anemia of chronic disease   . Anxiety   . Arthritis    "hands" (05/09/2015)  . Chronic diastolic heart failure ([place])   . CKD (chronic kidney disease) stage 3, GFR 30-59 ml/min   . Dementia ([place])   . Elevated TSH 02/03/2018  . GERD (gastroesophageal reflux disease)   . HLD (hyperlipidemia)   . Hypertension   . LBBB (left bundle branch block)   . Mitral valve insufficiency   . Nonrheumatic mitral valve regurgitation 02/02/2019  . Osteoarthritis   . 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 S/P mitral valve repair    s/p MitraClip on 02/03/19  . Vitamin B12 deficiency 12/28/2018    Past Surgical History:  Procedure Laterality Date  . BIOPSY  10/28/2018   Procedure: BIOPSY;  Surgeon: Juanita Craver, MD;  Location: [facility] ENDOSCOPY;  Service: Endoscopy;;  . CARDIAC CATHETERIZATION  05/2004  . COLONOSCOPY  07/2002   Archie Endo 09/02/2010  . COLONOSCOPY WITH PROPOFOL N/A 10/28/2018   Procedure: COLONOSCOPY WITH PROPOFOL;  Surgeon: Juanita Craver, MD;  Location: [facility] ENDOSCOPY;  Service: Endoscopy;  Laterality: N/A;  . ENTEROSCOPY N/A 02/24/2019   Procedure: ENTEROSCOPY;  Surgeon: Carol Ada, MD;  Location: [place];  Service: Endoscopy;  Laterality: N/A;  . ENTEROSCOPY N/A 05/23/2019   Procedure: ENTEROSCOPY;  Surgeon: Carol Ada, MD;  Location: [place];  Service: Endoscopy;  Laterality: N/A;  . ESOPHAGOGASTRODUODENOSCOPY  07/2002   w/biopsies/notes 09/02/2010  . ESOPHAGOGASTRODUODENOSCOPY (EGD) WITH PROPOFOL N/A 10/28/2018   Procedure: ESOPHAGOGASTRODUODENOSCOPY (EGD) WITH PROPOFOL;  Surgeon: Juanita Craver, MD;  Location: [facility] ENDOSCOPY;  Service: Endoscopy;  Laterality: N/A;  . GIVENS CAPSULE STUDY N/A 02/23/2019   Procedure: GIVENS CAPSULE STUDY;  Surgeon: Carol Ada, MD;  Location: [place];  Service: Endoscopy;  Laterality: N/A;  . HOT HEMOSTASIS N/A 10/28/2018   Procedure: HOT HEMOSTASIS  (ARGON PLASMA COAGULATION/BICAP);  Surgeon: Juanita Craver, MD;  Location: [facility] ENDOSCOPY;  Service: Endoscopy;  Laterality: N/A;  . HOT HEMOSTASIS N/A 02/24/2019   Procedure: HOT HEMOSTASIS (ARGON PLASMA COAGULATION/BICAP);  Surgeon: Carol Ada, MD;  Location: [place];  Service: Endoscopy;  Laterality: N/A;  . HOT HEMOSTASIS N/A 05/23/2019   Procedure: HOT HEMOSTASIS (ARGON PLASMA COAGULATION/BICAP);  Surgeon: Carol Ada, MD;  Location: [place];  Service: Endoscopy;  Laterality: N/A;  . IR ANGIOGRAM SELECTIVE EACH ADDITIONAL VESSEL  10/30/2018  . IR ANGIOGRAM SELECTIVE EACH ADDITIONAL VESSEL  10/30/2018  . IR ANGIOGRAM SELECTIVE EACH ADDITIONAL VESSEL  10/30/2018  . IR ANGIOGRAM SELECTIVE EACH ADDITIONAL VESSEL  10/30/2018  . IR ANGIOGRAM VISCERAL SELECTIVE  10/30/2018  . IR EMBO ART  VEN HEMORR LYMPH EXTRAV  INC GUIDE ROADMAPPING  10/30/2018  . IR US GUIDANCE  10/30/2018  . MITRAL VALVE REPAIR N/A 02/02/2019   Procedure: MITRAL VALVE REPAIR;  Surgeon: Sherren Mocha, MD;  Location: [place] CV LAB;  Service: Cardiovascular;  Laterality: N/A;  . POLYPECTOMY  10/28/2018   Procedure: POLYPECTOMY;  Surgeon: Juanita Craver, MD;  Location: [facility] ENDOSCOPY;  Service: Endoscopy;;  . RIGHT/LEFT HEART CATH AND CORONARY ANGIOGRAPHY N/A 12/29/2018   Procedure: RIGHT/LEFT HEART CATH AND CORONARY ANGIOGRAPHY;  Surgeon: Leonie Man, MD;  Location: [place] CV LAB;  Service: Cardiovascular;  Laterality: N/A;  . TEE WITHOUT CARDIOVERSION N/A 12/30/2018   Procedure: TRANSESOPHAGEAL ECHOCARDIOGRAM (TEE);  Surgeon: Lelon Perla, MD;  Location: [facility] ENDOSCOPY;  Service: Cardiovascular;  Laterality: N/A;    Current Medications: Outpatient Medications Prior to Visit  Medication Sig Dispense Refill  . acetaminophen (TYLENOL) 325 MG tablet Take 650 mg by mouth every 6 (six) hours as needed for mild pain or headache.    Marland Kitchen atorvastatin (LIPITOR) 40 MG tablet Take 1 tablet (40 mg total) by mouth daily.  (Patient taking differently: Take 40 mg by mouth daily at 6 PM. ) 30 tablet 0  . bisacodyl (DULCOLAX) 10 MG suppository Place 10 mg rectally daily as needed (constipation not relieved by MOM).    . cyanocobalamin 1000 MCG tablet Take 1 tablet (1,000 mcg total) by mouth daily. 30 tablet 0  . ferrous sulfate 324 MG TBEC Take 1 tablet (324 mg total) by mouth daily. 30 tablet 0  . furosemide (LASIX) 40 MG tablet Take 1 tablet (40 mg total) by mouth daily. 45 tablet 0  . guaifenesin (ROBITUSSIN) 100 MG/5ML syrup Take 400 mg by mouth every 6 (six) hours.    . Magnesium Hydroxide (MILK OF MAGNESIA PO) Take 30 mLs by mouth daily as needed (constipation (if no BM in 3 days)).    . NON FORMULARY Diet Regular    . NON FORMULARY Wander Guard Alert Device: Check placement every shift. (Nursing Measure) S/R: Alarms    . pantoprazole (PROTONIX) 40 MG tablet Take 1 tablet (40 mg total) by mouth daily. (Patient taking differently: Take 40 mg by mouth daily before breakfast. ) 30 tablet 0  .  ramelteon (ROZEREM) 8 MG tablet Take 1 tablet (8 mg total) by mouth at bedtime. 30 tablet 0  . Sodium Phosphates (FLEET ENEMA RE) Place 1 enema rectally daily as needed (constipation not relieved by bisacodyl suppository).    . zinc sulfate (ZINC-220) 220 (50 Zn) MG capsule Take 220 mg by mouth daily.    . metoprolol succinate (TOPROL-XL) 25 MG 24 hr tablet Take 1 tablet (25 mg total) by mouth daily. Take with or immediately following a meal. 30 tablet 0   No facility-administered medications prior to visit.     Allergies:   Plavix [clopidogrel]   Social History   Socioeconomic History  . Marital status: Widowed    Spouse name: Chrissie Noa  . Number of children: 4  . Years of education: 87  . Highest education level: High school graduate  Occupational History  . Occupation: Retired     Comment: Press photographer  Tobacco Use  . Smoking status: Former Smoker    Packs/day: 0.30    Years: 8.00    Pack years: 2.40    Types:  Cigarettes    Quit date: 05/30/2004    Years since quitting: 15.0  . Smokeless tobacco: Never Used  Substance and Sexual Activity  . Alcohol use: No    Alcohol/week: 0.0 standard drinks  . Drug use: Never  . Sexual activity: Not Currently  Other Topics Concern  . Not on file  Social History Narrative   Emergency Contact: [name] [phone]   End of Life Plan: gave pt ad pamplet   Any pets: 1 Dog    Diet: pt has a varied diet, low consumption of carbs and fatty foods    Exercise: pt does not have a regular exercise routine    Seatbelts: pt wears seat regularly in car   Hobbies: spending time with grandchildren      *Updated 09/26/2018*   Patients daughter [name] lives with her and assists her in driving to doctors apts and errands. Patient does have a renewed drivers licenses, but feels more comfortable riding with her daughter. Patient enjoys going out to eat and spending time with her grandchildren who live in [place]. Patient stated they come during the summer to see her. Patient is excited her son, Vicente Males, is about to finish school to become a Theme park manager.    The patient has great family support system.    Social Determinants of Health   Financial Resource Strain: Low Risk   . Difficulty of Paying Living Expenses: Not hard at all  Food Insecurity: No Food Insecurity  . Worried About Charity fundraiser in the Last Year: Never true  . Ran Out of Food in the Last Year: Never true  Transportation Needs: No Transportation Needs  . Lack of Transportation (Medical): No  . Lack of Transportation (Non-Medical): No  Physical Activity: Inactive  . Days of Exercise per Week: 0 days  . Minutes of Exercise per Session: 0 min  Stress: No Stress Concern Present  . Feeling of Stress : Not at all  Social Connections: Slightly Isolated  . Frequency of Communication with Friends and Family: More than three times a week  . Frequency of Social Gatherings with Friends and Family: More than three  times a week  . Attends Religious Services: More than 4 times per year  . Active Member of Clubs or Organizations: Yes  . Attends Archivist Meetings: Never  . Marital Status: Widowed     Family History:  The patient's family  history includes Breast cancer in her sister; Cancer in her sister; Heart disease in her mother; Hypertension in her father and sister.     ROS:   Please see the history of present illness.    ROS All other systems reviewed and are negative.   PHYSICAL EXAM:   VS:  BP (!) 100/52   Pulse 98   Ht 5\' 2"  (1.575 m)   Wt 159 lb (72.1 kg)   SpO2 96%   BMI 29.08 kg/m    Affect appropriate Healthy:  appears stated age 57: normal Neck supple with no adenopathy JVP normal no bruits no thyromegaly Lungs clear with no wheezing and good diaphragmatic motion Heart:  S1/S2 MR  murmur, no rub, gallop or click PMI normal Abdomen: benighn, BS positve, no tenderness, no AAA no bruit.  No HSM or HJR Distal pulses intact with no bruits No edema Neuro non-focal Skin warm and dry No muscular weakness    Wt Readings from Last 3 Encounters:  06/14/19 159 lb (72.1 kg)  05/26/19 162 lb 6.4 oz (73.7 kg)  05/19/19 164 lb 3.2 oz (74.5 kg)      Studies/Labs Reviewed:   EKG:   SR rate 89 PAC/PVC ICLBBB 06/14/19   Recent Labs: 12/09/2018: TSH 4.175 02/21/2019: B Natriuretic Peptide 258.0 02/28/2019: Magnesium 2.0 05/26/2019: ALT 13; BUN 21; Creatinine, Ser 1.07; Hemoglobin 9.6; Platelets 295; Potassium 4.2; Sodium 141   Lipid Panel    Component Value Date/Time   CHOL 127 12/09/2018 0716   CHOL 231 (H) 10/14/2016 0930   TRIG 44 12/09/2018 0716   HDL 55 12/09/2018 0716   HDL 52 10/14/2016 0930   CHOLHDL 2.3 12/09/2018 0716   VLDL 9 12/09/2018 0716   LDLCALC 63 12/09/2018 0716   LDLCALC 151 (H) 10/14/2016 0930    Additional studies/ records that were reviewed today include:  02/02/19 MITRAL VALVE REPAIR  Conclusion Successful transcatheter  edge-to-edge mitral valve repair with a single MitraClip NTR placed A2/P2, reducing MR from severe at baseline to 1-2+, LA V wave reduced by 50% from baseline  Recommendations  Antiplatelet/Anticoag Recommend uninterrupted dual antiplatelet therapy with Aspirin 81mg  daily and Clopidogrel 75mg  daily for 6 months.    _____________    Echo 02/03/19:  IMPRESSIONS 1. Left ventricular ejection fraction, by visual estimation, is 30 to 35%. The left ventricle has moderately decreased function. Left ventricular septal wall thickness was normal. There is mildly increased left ventricular hypertrophy. 2. Left ventricular diastolic function could not be evaluated pattern of LV diastolic filling. 3. Global right ventricle has normal systolic function.The right ventricular size is normal. No increase in right ventricular wall thickness. 4. Left atrial size was severely dilated. 5. Right atrial size was mildly dilated. 6. Trivial pericardial effusion is present. 7. The mitral valve has been repaired/replaced. Mild mitral valve regurgitation. Moderate mitral stenosis. 8. The tricuspid valve is normal in structure. Tricuspid valve regurgitation is mild. 9. The aortic valve is normal in structure. Aortic valve regurgitation was not visualized by color flow Doppler. 10. The pulmonic valve was not assessed. Pulmonic valve regurgitation was not assessed by color flow Doppler. 11. Aortic root could not be assessed. 12. Day 1 post MitraClip placement. Mitral regurgitation is mild. Transmitral gradients are elevated at 7 mmHg. LVEF decreased from prior the procedure from 50-55% to 30-35% with paradoxical septal motion. No pericardial effusion. 13. Iatrogenic left to right shunting.  _____________   03/02/19 IMPRESSIONS    1. Left ventricular ejection fraction, by visual estimation,  is 30 to 35%. The left ventricle has moderately decreased function. There is no left ventricular hypertrophy.   2. There is akinesis of the inferoseptum and anteroseptum and global diffuse hypokinesis.  3. Left ventricular diastolic parameters are consistent with Grade II diastolic dysfunction (pseudonormalization).  4. Elevated left atrial and left ventricular end-diastolic pressures.  5. Global right ventricle has normal systolic function.The right ventricular size is normal. No increase in right ventricular wall thickness.  6. Left atrial size was severely dilated.  7. Right atrial size was normal.  8. S/P MitralClip placement. The mean transmitral valve gradient is 82mmHg. Mild to moderate mitral valve regurgitation.  9. The tricuspid valve is normal in structure. Tricuspid valve regurgitation is trivial. 10. The aortic valve is normal in structure. Aortic valve regurgitation is not visualized. No evidence of aortic valve sclerosis or stenosis. 11. The pulmonic valve was normal in structure. Pulmonic valve regurgitation is not visualized. 12. Normal pulmonary artery systolic pressure. 13. The inferior vena cava is normal in size with greater than 50% respiratory variability, suggesting right atrial pressure of 3 mmHg.   ASSESSMENT & PLAN:   Severe MR s/p MitraClip: she is now on only monotherapy with aspirin given recent GI bleed with aspirin and plavix. SBE prophylaxis discussed; I have RX'd amoxicillin. She has NYHA class I but has not been very active.  Echo 03/02/19  showed EF 30-35%, normally functioning MitraClip mild/moderate MR and mild MS with a mean gradient 5 mm HG.    Chronic combined S/D CHF: appears euvolemic. Not on ARB due to renal failure and low BP will try to increase lopressor today due to elevated HR   CKD stage III: improved Cr 1.07 05/26/19   Chronic anemia with hx of recent GI bleed: recent enteroscopy showed multiple angiodysplastic lesions with stigmata of recent bleeding s/p coagulation. Hg has remained stable and she is feeling much better. FU Hb GI/primary Hct 32.9 on  05/26/19 Last transfusion appears to have been 05/29/19   Medication Adjustments/Labs and Tests Ordered: Increase lopressor to 50 mg daily   F/U with PA/structural 6 months and me in a year   Signed, Jenkins Rouge, MD  06/14/2019 10:57 AM    [facility] [address], [place], [place]  [address] Phone: [phone]; Fax: [phone]

## 2019-06-14 ENCOUNTER — Other Ambulatory Visit: Payer: Self-pay

## 2019-06-14 ENCOUNTER — Encounter: Payer: Self-pay | Admitting: Cardiovascular Disease

## 2019-06-14 ENCOUNTER — Ambulatory Visit: Payer: Medicare Other | Admitting: Cardiovascular Disease

## 2019-06-14 DIAGNOSIS — I5042 Chronic combined systolic (congestive) and diastolic (congestive) heart failure: Secondary | ICD-10-CM | POA: Diagnosis not present

## 2019-06-14 MED ORDER — METOPROLOL SUCCINATE ER 50 MG PO TB24: 50.0000 mg | ORAL_TABLET | Freq: Every day | ORAL | 3 refills | Status: DC

## 2019-06-14 NOTE — Patient Instructions (Addendum)
Medication Instructions:  Your physician has recommended you make the following change in your medication:  1-INCREASE Metoprolol 50 mg by mouth daily.  *If you need a refill on your cardiac medications before your next appointment, please call your pharmacy*  Lab Work: If you have labs (blood work) drawn today and your tests are completely normal, you will receive your results only by: Marland Kitchen MyChart Message (if you have MyChart) OR . A paper copy in the mail If you have any lab test that is abnormal or we need to change your treatment, we will call you to review the results.  Follow-Up: At [facility], you and your health needs are our priority.  As part of our continuing mission to provide you with exceptional heart care, we have created designated Provider Care Teams.  These Care Teams include your primary Cardiologist (physician) and Advanced Practice Providers (APPs -  Physician Assistants and Nurse Practitioners) who all work together to provide you with the care you need, when you need it.  Your next appointment:   4 to 6 weeks  The format for your next appointment:   In Person  Provider:   With [facility]  Your physician recommends that you schedule a follow-up appointment in: October with Nell Range PA.

## 2019-06-15 ENCOUNTER — Other Ambulatory Visit: Payer: Self-pay | Admitting: Family Medicine

## 2019-06-15 DIAGNOSIS — E785 Hyperlipidemia, unspecified: Secondary | ICD-10-CM

## 2019-06-15 DIAGNOSIS — E538 Deficiency of other specified B group vitamins: Secondary | ICD-10-CM

## 2019-06-15 DIAGNOSIS — D62 Acute posthemorrhagic anemia: Secondary | ICD-10-CM

## 2019-06-15 DIAGNOSIS — I5042 Chronic combined systolic (congestive) and diastolic (congestive) heart failure: Secondary | ICD-10-CM

## 2019-06-15 DIAGNOSIS — K922 Gastrointestinal hemorrhage, unspecified: Secondary | ICD-10-CM

## 2019-06-15 MED ORDER — FUROSEMIDE 40 MG PO TABS: 40.0000 mg | ORAL_TABLET | Freq: Every day | ORAL | 1 refills | Status: DC

## 2019-06-15 MED ORDER — CYANOCOBALAMIN 1000 MCG PO TABS: 1000.0000 ug | ORAL_TABLET | Freq: Every day | ORAL | 0 refills | Status: DC

## 2019-06-15 MED ORDER — PANTOPRAZOLE SODIUM 40 MG PO TBEC: 40.0000 mg | DELAYED_RELEASE_TABLET | Freq: Every day | ORAL | 1 refills | Status: DC

## 2019-06-15 MED ORDER — ATORVASTATIN CALCIUM 40 MG PO TABS: 40.0000 mg | ORAL_TABLET | Freq: Every day | ORAL | 1 refills | Status: DC

## 2019-06-15 MED ORDER — FERROUS SULFATE 324 MG PO TBEC: 324.0000 mg | DELAYED_RELEASE_TABLET | Freq: Every day | ORAL | 1 refills | Status: DC

## 2019-06-18 ENCOUNTER — Encounter: Payer: Self-pay | Admitting: Family Medicine

## 2019-06-19 ENCOUNTER — Telehealth: Payer: Self-pay | Admitting: Family Medicine

## 2019-06-19 ENCOUNTER — Encounter (HOSPITAL_COMMUNITY): Payer: Self-pay | Admitting: Nurse Practitioner

## 2019-06-19 ENCOUNTER — Ambulatory Visit (HOSPITAL_COMMUNITY)
Admission: RE | Admit: 2019-06-19 | Discharge: 2019-06-19 | Disposition: A | Discharge status: Home / Self Care | Payer: Medicare Other | Source: Ambulatory Visit | Attending: Nurse Practitioner | Admitting: Nurse Practitioner

## 2019-06-19 ENCOUNTER — Other Ambulatory Visit: Payer: Self-pay

## 2019-06-19 VITALS — BP 120/46 | HR 81 | Ht 62.0 in | Wt 163.2 lb

## 2019-06-19 DIAGNOSIS — M1990 Unspecified osteoarthritis, unspecified site: Secondary | ICD-10-CM | POA: Insufficient documentation

## 2019-06-19 DIAGNOSIS — E538 Deficiency of other specified B group vitamins: Secondary | ICD-10-CM | POA: Diagnosis not present

## 2019-06-19 DIAGNOSIS — Z8249 Family history of ischemic heart disease and other diseases of the circulatory system: Secondary | ICD-10-CM | POA: Insufficient documentation

## 2019-06-19 DIAGNOSIS — K219 Gastro-esophageal reflux disease without esophagitis: Secondary | ICD-10-CM | POA: Diagnosis not present

## 2019-06-19 DIAGNOSIS — Z79899 Other long term (current) drug therapy: Secondary | ICD-10-CM | POA: Diagnosis not present

## 2019-06-19 DIAGNOSIS — Z803 Family history of malignant neoplasm of breast: Secondary | ICD-10-CM | POA: Insufficient documentation

## 2019-06-19 DIAGNOSIS — Z87891 Personal history of nicotine dependence: Secondary | ICD-10-CM | POA: Diagnosis not present

## 2019-06-19 DIAGNOSIS — I454 Nonspecific intraventricular block: Secondary | ICD-10-CM | POA: Insufficient documentation

## 2019-06-19 DIAGNOSIS — D649 Anemia, unspecified: Secondary | ICD-10-CM | POA: Diagnosis not present

## 2019-06-19 DIAGNOSIS — N183 Chronic kidney disease, stage 3 unspecified: Secondary | ICD-10-CM | POA: Diagnosis not present

## 2019-06-19 DIAGNOSIS — Z8719 Personal history of other diseases of the digestive system: Secondary | ICD-10-CM | POA: Insufficient documentation

## 2019-06-19 DIAGNOSIS — I130 Hypertensive heart and chronic kidney disease with heart failure and stage 1 through stage 4 chronic kidney disease, or unspecified chronic kidney disease: Secondary | ICD-10-CM | POA: Diagnosis not present

## 2019-06-19 DIAGNOSIS — I499 Cardiac arrhythmia, unspecified: Secondary | ICD-10-CM

## 2019-06-19 DIAGNOSIS — Z888 Allergy status to other drugs, medicaments and biological substances status: Secondary | ICD-10-CM | POA: Insufficient documentation

## 2019-06-19 DIAGNOSIS — R9431 Abnormal electrocardiogram [ECG] [EKG]: Secondary | ICD-10-CM | POA: Diagnosis not present

## 2019-06-19 DIAGNOSIS — F0390 Unspecified dementia without behavioral disturbance: Secondary | ICD-10-CM | POA: Insufficient documentation

## 2019-06-19 DIAGNOSIS — I491 Atrial premature depolarization: Secondary | ICD-10-CM | POA: Insufficient documentation

## 2019-06-19 DIAGNOSIS — I5032 Chronic diastolic (congestive) heart failure: Secondary | ICD-10-CM | POA: Diagnosis not present

## 2019-06-19 DIAGNOSIS — R5383 Other fatigue: Secondary | ICD-10-CM | POA: Insufficient documentation

## 2019-06-19 DIAGNOSIS — E785 Hyperlipidemia, unspecified: Secondary | ICD-10-CM | POA: Diagnosis not present

## 2019-06-19 LAB — CBC
HCT: 37.8 % (ref 36.0–46.0)
Hemoglobin: 11.4 g/dL — ABNORMAL LOW (ref 12.0–15.0)
MCH: 28.9 pg (ref 26.0–34.0)
MCHC: 30.2 g/dL (ref 30.0–36.0)
MCV: 95.9 fL (ref 80.0–100.0)
Platelets: 224 10*3/uL (ref 150–400)
RBC: 3.94 MIL/uL (ref 3.87–5.11)
RDW: 14.7 % (ref 11.5–15.5)
WBC: 6 10*3/uL (ref 4.0–10.5)
nRBC: 0 % (ref 0.0–0.2)

## 2019-06-19 MED ORDER — BUSPIRONE HCL 5 MG PO TABS: 5.0000 mg | ORAL_TABLET | Freq: Three times a day (TID) | ORAL | 0 refills | Status: DC | PRN

## 2019-06-19 NOTE — Addendum Note (Signed)
Encounter addended by: Sherran Needs, NP on: 06/19/2019 3:53 PM  Actions taken: Clinical Note Signed

## 2019-06-19 NOTE — Telephone Encounter (Signed)
Spoke w son  She is having intermittent anxiety.  Recommend try buspar 5 mg three times a day as needed. Can discuss further at upcoming visit.

## 2019-06-19 NOTE — Progress Notes (Addendum)
Primary Care Physician: [name], MD Referring Physician: Dr. Cleon Gustin is a 74 y.o. female with a h/o of chronic diastolicCHF,HTN, dementia, GI bleed,anxietyand severe mitral regurgitation s/p MitraClip (02/02/19) who presents to afib clinic. She was readmitted 113/-02/28/20 for symptomatic anemia with HGB down to 5.7. She was transfused. Enteroscopy was performed showing normal esophagus, normal stomach, normal duodenum; however, multiple angiodysplastic lesions with stigmata of recent bleeding were found in the proximal jejunum. Coagulation was successful.She was continued on Protonix 40mg  daily.Patient's hemoglobin remained around 11 for 3 days prior to discharge. She had AKI and losartan discontinued and lasix decreased to 40mg  daily. Plavix was discontinued.  Pt was referred to the afib clinic at time of last visi, 06/14/19, by Dr. Johnsie Cancel. Her EKG at that time showed SR with BBB. Per his assessment and plan, there was no mention of afib but in the AVS said to f/u with afib clinic. I reviewed other EKG's and notes and no afib was seen or mentioned.  Her HR was elevated in the 90's that day and he increased BB.   Today, her ekg shows SR with PAC's at 81 bpm. . Her daughter is asking for  her CBC to be drawn as she is c/o of being cold and tired like previous with anemia. No blood seen in stool. She has been told she can not take any type of blood thinners 2/2 to  prior GI bleed.   Today, she denies symptoms of palpitations, chest pain, shortness of breath, orthopnea, PND, lower extremity edema, dizziness, presyncope, syncope, or neurologic sequela. The patient is tolerating medications without difficulties and is otherwise without complaint today.   Past Medical History:  Diagnosis Date  . Anemia   . Anemia of chronic disease   . Anxiety   . Arthritis    "hands" (05/09/2015)  . Chronic diastolic heart failure ([place])   . CKD (chronic kidney disease)  stage 3, GFR 30-59 ml/min   . Dementia ([place])   . Elevated TSH 02/03/2018  . GERD (gastroesophageal reflux disease)   . HLD (hyperlipidemia)   . Hypertension   . LBBB (left bundle branch block)   . Mitral valve insufficiency   . Nonrheumatic mitral valve regurgitation 02/02/2019  . Osteoarthritis   . 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 S/P mitral valve repair    s/p MitraClip on 02/03/19  . Vitamin B12 deficiency 12/28/2018   Past Surgical History:  Procedure Laterality Date  . BIOPSY  10/28/2018   Procedure: BIOPSY;  Surgeon: Juanita Craver, MD;  Location: [facility] ENDOSCOPY;  Service: Endoscopy;;  . CARDIAC CATHETERIZATION  05/2004  . COLONOSCOPY  07/2002   Archie Endo 09/02/2010  . COLONOSCOPY WITH PROPOFOL N/A 10/28/2018   Procedure: COLONOSCOPY WITH PROPOFOL;  Surgeon: Juanita Craver, MD;  Location: [facility] ENDOSCOPY;  Service: Endoscopy;  Laterality: N/A;  . ENTEROSCOPY N/A 02/24/2019   Procedure: ENTEROSCOPY;  Surgeon: Carol Ada, MD;  Location: [place];  Service: Endoscopy;  Laterality: N/A;  . ENTEROSCOPY N/A 05/23/2019   Procedure: ENTEROSCOPY;  Surgeon: Carol Ada, MD;  Location: [facility] ENDOSCOPY;  Service: Endoscopy;  Laterality: N/A;  . ESOPHAGOGASTRODUODENOSCOPY  07/2002   w/biopsies/notes 09/02/2010  . ESOPHAGOGASTRODUODENOSCOPY (EGD) WITH PROPOFOL N/A 10/28/2018   Procedure: ESOPHAGOGASTRODUODENOSCOPY (EGD) WITH PROPOFOL;  Surgeon: Juanita Craver, MD;  Location: [facility] ENDOSCOPY;  Service: Endoscopy;  Laterality: N/A;  . GIVENS CAPSULE STUDY N/A 02/23/2019   Procedure: GIVENS CAPSULE STUDY;  Surgeon: Carol Ada, MD;  Location: [place];  Service: Endoscopy;  Laterality: N/A;  . HOT HEMOSTASIS N/A 10/28/2018   Procedure: HOT HEMOSTASIS (ARGON PLASMA COAGULATION/BICAP);  Surgeon: Juanita Craver, MD;  Location: [facility] ENDOSCOPY;  Service: Endoscopy;  Laterality: N/A;  . HOT HEMOSTASIS N/A 02/24/2019   Procedure: HOT HEMOSTASIS (ARGON PLASMA COAGULATION/BICAP);  Surgeon: Carol Ada, MD;  Location: [place];  Service: Endoscopy;  Laterality: N/A;  . HOT HEMOSTASIS N/A 05/23/2019   Procedure: HOT HEMOSTASIS (ARGON PLASMA COAGULATION/BICAP);  Surgeon: Carol Ada, MD;  Location: [place];  Service: Endoscopy;  Laterality: N/A;  . IR ANGIOGRAM SELECTIVE EACH ADDITIONAL VESSEL  10/30/2018  . IR ANGIOGRAM SELECTIVE EACH ADDITIONAL VESSEL  10/30/2018  . IR ANGIOGRAM SELECTIVE EACH ADDITIONAL VESSEL  10/30/2018  . IR ANGIOGRAM SELECTIVE EACH ADDITIONAL VESSEL  10/30/2018  . IR ANGIOGRAM VISCERAL SELECTIVE  10/30/2018  . IR EMBO ART  VEN HEMORR LYMPH EXTRAV  INC GUIDE ROADMAPPING  10/30/2018  . IR US GUIDANCE  10/30/2018  . MITRAL VALVE REPAIR N/A 02/02/2019   Procedure: MITRAL VALVE REPAIR;  Surgeon: Sherren Mocha, MD;  Location: [place] CV LAB;  Service: Cardiovascular;  Laterality: N/A;  . POLYPECTOMY  10/28/2018   Procedure: POLYPECTOMY;  Surgeon: Juanita Craver, MD;  Location: [facility] ENDOSCOPY;  Service: Endoscopy;;  . RIGHT/LEFT HEART CATH AND CORONARY ANGIOGRAPHY N/A 12/29/2018   Procedure: RIGHT/LEFT HEART CATH AND CORONARY ANGIOGRAPHY;  Surgeon: Leonie Man, MD;  Location: [place] CV LAB;  Service: Cardiovascular;  Laterality: N/A;  . TEE WITHOUT CARDIOVERSION N/A 12/30/2018   Procedure: TRANSESOPHAGEAL ECHOCARDIOGRAM (TEE);  Surgeon: Lelon Perla, MD;  Location: [facility] ENDOSCOPY;  Service: Cardiovascular;  Laterality: N/A;    Current Outpatient Medications  Medication Sig Dispense Refill  . acetaminophen (TYLENOL) 325 MG tablet Take 650 mg by mouth every 6 (six) hours as needed for mild pain or headache.    Marland Kitchen atorvastatin (LIPITOR) 40 MG tablet Take 1 tablet (40 mg total) by mouth daily at 6 PM. 90 tablet 1  . cyanocobalamin 1000 MCG tablet Take 1 tablet (1,000 mcg total) by mouth daily. 90 tablet 0  . ferrous sulfate 324 MG TBEC Take 1 tablet (324 mg total) by mouth daily. 90 tablet 1  . furosemide (LASIX) 40 MG tablet Take 1 tablet (40 mg total) by mouth daily. 90 tablet 1    . Magnesium Hydroxide (MILK OF MAGNESIA PO) Take 30 mLs by mouth daily as needed (constipation (if no BM in 3 days)).    Marland Kitchen metoprolol succinate (TOPROL-XL) 50 MG 24 hr tablet Take 1 tablet (50 mg total) by mouth daily. Take with or immediately following a meal. 90 tablet 3  . pantoprazole (PROTONIX) 40 MG tablet Take 1 tablet (40 mg total) by mouth daily. 90 tablet 1  . Sodium Phosphates (FLEET ENEMA RE) Place 1 enema rectally daily as needed (constipation not relieved by bisacodyl suppository).    . zinc sulfate (ZINC-220) 220 (50 Zn) MG capsule Take 220 mg by mouth daily.    . ramelteon (ROZEREM) 8 MG tablet Take 1 tablet (8 mg total) by mouth at bedtime. (Patient not taking: Reported on 06/19/2019) 30 tablet 0   No current facility-administered medications for this encounter.    Allergies  Allergen Reactions  . Plavix [Clopidogrel] Other (See Comments)    GI bleed from jejunal angiodysplastic lesions in context of Plavix and aspirin dual therapy    Social History   Socioeconomic History  . Marital status: Widowed    Spouse name: Chrissie Noa  . Number of children: 4  .  Years of education: 36  . Highest education level: High school graduate  Occupational History  . Occupation: Retired     Comment: Press photographer  Tobacco Use  . Smoking status: Former Smoker    Packs/day: 0.30    Years: 8.00    Pack years: 2.40    Types: Cigarettes    Quit date: 05/30/2004    Years since quitting: 15.0  . Smokeless tobacco: Never Used  Substance and Sexual Activity  . Alcohol use: No    Alcohol/week: 0.0 standard drinks  . Drug use: Never  . Sexual activity: Not Currently  Other Topics Concern  . Not on file  Social History Narrative   Emergency Contact: [name] [phone]   End of Life Plan: gave pt ad pamplet   Any pets: 1 Dog    Diet: pt has a varied diet, low consumption of carbs and fatty foods    Exercise: pt does not have a regular exercise routine    Seatbelts: pt wears seat regularly in  car   Hobbies: spending time with grandchildren      *Updated 09/26/2018*   Patients daughter [name] lives with her and assists her in driving to doctors apts and errands. Patient does have a renewed drivers licenses, but feels more comfortable riding with her daughter. Patient enjoys going out to eat and spending time with her grandchildren who live in [place]. Patient stated they come during the summer to see her. Patient is excited her son, Vicente Males, is about to finish school to become a Theme park manager.    The patient has great family support system.    Social Determinants of Health   Financial Resource Strain: Low Risk   . Difficulty of Paying Living Expenses: Not hard at all  Food Insecurity: No Food Insecurity  . Worried About Charity fundraiser in the Last Year: Never true  . Ran Out of Food in the Last Year: Never true  Transportation Needs: No Transportation Needs  . Lack of Transportation (Medical): No  . Lack of Transportation (Non-Medical): No  Physical Activity: Inactive  . Days of Exercise per Week: 0 days  . Minutes of Exercise per Session: 0 min  Stress: No Stress Concern Present  . Feeling of Stress : Not at all  Social Connections: Slightly Isolated  . Frequency of Communication with Friends and Family: More than three times a week  . Frequency of Social Gatherings with Friends and Family: More than three times a week  . Attends Religious Services: More than 4 times per year  . Active Member of Clubs or Organizations: Yes  . Attends Archivist Meetings: Never  . Marital Status: Widowed  Intimate Partner Violence: Not At Risk  . Fear of Current or Ex-Partner: No  . Emotionally Abused: No  . Physically Abused: No  . Sexually Abused: No    Family History  Problem Relation Age of Onset  . Heart disease Mother        died at age 24  . Breast cancer Sister   . Cancer Sister        Breast  . Hypertension Father   . Hypertension Sister     ROS- All systems are  reviewed and negative except as per the HPI above  Physical Exam: Vitals:   06/19/19 1424  BP: (!) 120/46  Pulse: 81  Weight: 74 kg  Height: 5\' 2"  (1.575 m)   Wt Readings from Last 3 Encounters:  06/19/19 74 kg  06/14/19 72.1  kg  05/26/19 73.7 kg    Labs: Lab Results  Component Value Date   NA 141 05/26/2019   K 4.2 05/26/2019   CL 111 05/26/2019   CO2 22 05/26/2019   GLUCOSE 104 (H) 05/26/2019   BUN 21 05/26/2019   CREATININE 1.07 (H) 05/26/2019   CALCIUM 8.2 (L) 05/26/2019   PHOS 4.4 02/28/2019   MG 2.0 02/28/2019   Lab Results  Component Value Date   INR 1.1 01/30/2019   Lab Results  Component Value Date   CHOL 127 12/09/2018   HDL 55 12/09/2018   LDLCALC 63 12/09/2018   TRIG 44 12/09/2018     GEN- The patient is well appearing, alert and oriented x 3 today.   Head- normocephalic, atraumatic Eyes-  Sclera clear, conjunctiva pink Ears- hearing intact Oropharynx- clear Neck- supple, no JVP Lymph- no cervical lymphadenopathy Lungs- Clear to ausculation bilaterally, normal work of breathing Heart- Regular rate and rhythm, no murmurs, rubs or gallops, PMI not laterally displaced GI- soft, NT, ND, + BS Extremities- no clubbing, cyanosis, or edema MS- no significant deformity or atrophy Skin- no rash or lesion Psych- euthymic mood, full affect Neuro- strength and sensation are intact  EKG- SR with a short atrial run/PAC's. PRior ekg's reviewed, no afib seen.   Echo-1. Left ventricular ejection fraction, by visual estimation, is 30 to 35%. The left ventricle has moderately decreased function. There is no left ventricular hypertrophy. 2. There is akinesis of the inferoseptum and anteroseptum and global diffuse hypokinesis. 3. Left ventricular diastolic parameters are consistent with Grade II diastolic dysfunction (pseudonormalization). 4. Elevated left atrial and left ventricular end-diastolic pressures. 5. Global right ventricle has normal systolic  function.The right ventricular size is normal. No increase in right ventricular wall thickness. 6. Left atrial size was severely dilated. 7. Right atrial size was normal. 8. S/P MitralClip placement. The mean transmitral valve gradient is 70mmHg. Mild to moderate mitral valve regurgitation. 9. The tricuspid valve is normal in structure. Tricuspid valve regurgitation is trivial. 10. The aortic valve is normal in structure. Aortic valve regurgitation is not visualized. No evidence of aortic valve sclerosis or stenosis. 11. The pulmonic valve was normal in structure. Pulmonic valve regurgitation is not visualized. 12. Normal pulmonary artery systolic pressure. 13. The inferior vena cava is normal in size with greater than 50% respiratory variability, suggesting right atrial pressure of 3 mmHg.   Assessment and Plan: 1. Referred for afib No documented afib seen   She has pac's today Will place a zio patch for one week   2. Prior GI bleed Will obtain cbc today at daughter's request as she has been c/o of feeling cold/tired. Will forward to PCP Has been told no blood thinners 2/2 prior GI bleed    3. CHF Fluid status stable  Continue  lasix  Continue BB Avoid salt   4. S/p mitral clip  Has f/u in October with Nell Range, PA   F/u per results of monitor   Butch Penny C. Andren Bethea, [facility] [address] [place], [place] [address] [phone]

## 2019-06-19 NOTE — Addendum Note (Signed)
Addended by: Jacinta Shoe on: 06/19/2019 08:16 AM   Modules accepted: Orders

## 2019-06-19 NOTE — Progress Notes (Signed)
Not sure either my note indicates f/u with structural heart PA katie for clip and me

## 2019-06-22 ENCOUNTER — Encounter (HOSPITAL_COMMUNITY): Payer: Self-pay | Admitting: Nurse Practitioner

## 2019-06-22 NOTE — Addendum Note (Signed)
Encounter addended by: Sherran Needs, NP on: 06/22/2019 8:40 AM  Actions taken: Problem List reviewed, Allergies reviewed, Medication List reviewed, Level of Service modified

## 2019-07-04 ENCOUNTER — Ambulatory Visit (INDEPENDENT_AMBULATORY_CARE_PROVIDER_SITE_OTHER): Payer: Medicare Other | Admitting: Family Medicine

## 2019-07-04 ENCOUNTER — Telehealth: Payer: Self-pay | Admitting: Family Medicine

## 2019-07-04 ENCOUNTER — Encounter: Payer: Self-pay | Admitting: Family Medicine

## 2019-07-04 ENCOUNTER — Other Ambulatory Visit: Payer: Self-pay

## 2019-07-04 DIAGNOSIS — F0151 Vascular dementia with behavioral disturbance: Secondary | ICD-10-CM | POA: Diagnosis not present

## 2019-07-04 DIAGNOSIS — I10 Essential (primary) hypertension: Secondary | ICD-10-CM

## 2019-07-04 DIAGNOSIS — D62 Acute posthemorrhagic anemia: Secondary | ICD-10-CM

## 2019-07-04 DIAGNOSIS — F01518 Vascular dementia, unspecified severity, with other behavioral disturbance: Secondary | ICD-10-CM

## 2019-07-04 DIAGNOSIS — I672 Cerebral atherosclerosis: Secondary | ICD-10-CM

## 2019-07-04 NOTE — Telephone Encounter (Signed)
Wonda Cerise patients daughter dropped off Handicap Tag papers to be filled out by doctor, Pt daughter would like to be contacted when completed. She will be picking up the papers. [phone]  Thanks

## 2019-07-04 NOTE — Assessment & Plan Note (Signed)
Seems stable with current living situation and anxiety treatment

## 2019-07-04 NOTE — Patient Instructions (Addendum)
Good to see you today!  Thanks for coming in.  Stop taking the zinc and take a regular multi vitamin daily  Take the buspar twice a day for anxiety  If any bleeding or shortness of breath or leg swelling then call me  Come back in 2 months  BRING ALL HER MEDICATION BOTTLES  Try to reset her My Chart  I will fill out the Handicap Parking

## 2019-07-04 NOTE — Progress Notes (Signed)
    SUBJECTIVE:   CHIEF COMPLAINT / HPI:   Overall is very happy.  Living with Daughter. [name] who is present and son is on phone.    ANEMIA No bleeding or lightheadness or melena.  Taking iron daily.  No antiplatelet agents.  Last hgb in March was much improved.  Taking PPI daily  ANXIETY Seems improved with buspar 5mg  twice a day.  Sleeps well  DEMENTIA Will wander if left alone.  Is living with daughter [name] No longer with daughter [name]   HYPERTENSION Recently had BB increased.  No chest pain or complaints of lightheadness   PERTINENT  PMH / PSH: valve repair  OBJECTIVE:   BP 138/72   Pulse 89   Wt 165 lb 12.8 oz (75.2 kg)   SpO2 97%   BMI 30.33 kg/m   Alert interactive.  Socially appropriate.  Does not know medications Heart - Regular rate and rhythm.  No murmurs, gallops or rubs.    Lungs:  Normal respiratory effort, chest expands symmetrically. Lungs are clear to auscultation, no crackles or wheezes. Extremities:  No cyanosis, edema, or deformity noted with good range of motion of all major joints.   Mobility:able to get up and down from exam table without assistance or distress   ASSESSMENT/PLAN:   Anemia due to acute blood loss From GI bleed.   Will continue no antiplatelets and iron.  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

## 2019-07-04 NOTE — Assessment & Plan Note (Signed)
Stable continue current medications

## 2019-07-04 NOTE — Assessment & Plan Note (Signed)
From GI bleed.   Will continue no antiplatelets and iron.  Monitor for symptoms

## 2019-07-04 NOTE — Telephone Encounter (Signed)
Placed in MDs box. Issabelle Mcraney, CMA  

## 2019-07-05 NOTE — Telephone Encounter (Signed)
Filled out returned to RN room

## 2019-07-06 ENCOUNTER — Encounter: Payer: Self-pay | Admitting: Family Medicine

## 2019-07-06 ENCOUNTER — Encounter (HOSPITAL_COMMUNITY): Payer: Self-pay | Admitting: *Deleted

## 2019-07-06 ENCOUNTER — Telehealth (HOSPITAL_COMMUNITY): Payer: Self-pay | Admitting: *Deleted

## 2019-07-06 DIAGNOSIS — R002 Palpitations: Secondary | ICD-10-CM | POA: Diagnosis not present

## 2019-07-06 NOTE — Telephone Encounter (Signed)
Pt notified via mychart

## 2019-07-06 NOTE — Telephone Encounter (Signed)
-----   Message from Sherran Needs, NP sent at 07/06/2019 10:37 AM EDT ----- Please let pt know no afib on monitor. Possible short  v runs vrs aberrancy, on BB. Has f/u with Dr. Johnsie Cancel 4/26.

## 2019-07-06 NOTE — Telephone Encounter (Signed)
Called [name] (patient's daughter), to pick up disability parking form. [name] states that she spoke with Dr. Erin Hearing regarding a doctor's note stating that patient cannot be left alone. [name] says that this is for patient to receive caregiver assistance.   This note was not found with other form.   To PCP  Please advise  Talbot Grumbling, RN

## 2019-07-06 NOTE — Telephone Encounter (Signed)
Printed letter delivered to [employer]

## 2019-07-13 ENCOUNTER — Other Ambulatory Visit: Payer: Self-pay | Admitting: *Deleted

## 2019-07-14 ENCOUNTER — Telehealth: Payer: Self-pay

## 2019-07-14 MED ORDER — BUSPIRONE HCL 5 MG PO TABS: 5.0000 mg | ORAL_TABLET | Freq: Two times a day (BID) | ORAL | 1 refills | Status: DC

## 2019-07-14 NOTE — Telephone Encounter (Signed)
[name] calls nurse line stating she wanted to try giving patient OTC melatonin. [name] stated she takes (2) 10mg  gummies a night. Would this be a good and safe option for patient to help her with rest. Please advise.

## 2019-07-17 NOTE — Telephone Encounter (Signed)
Called and informed Wonda Cerise per Dr. Erin Hearing, that it is ok to try the melatonin.  [name] was appreciative of call.  Ozella Almond, [place]

## 2019-07-17 NOTE — Telephone Encounter (Signed)
Please let her know that it would be ok to try the melatonin.  It should not interfere with her other medications  Thanks  LC

## 2019-07-26 ENCOUNTER — Telehealth: Payer: Self-pay | Admitting: Family Medicine

## 2019-07-26 MED ORDER — COLCHICINE 0.6 MG PO TABS: ORAL_TABLET | ORAL | 0 refills | Status: DC

## 2019-07-26 NOTE — Telephone Encounter (Signed)
Son texted she is having a gout attack Sent in colchicine

## 2019-08-07 NOTE — Progress Notes (Deleted)
[facility] [place]                                       Cardiology Office Note    Date:  08/07/2019   ID:  Hannah [name], DOB Sep 08, 1945, MRN [id number]  PCP:  [name], MD  Cardiologist: Dr. Johnsie Cancel  CC: MR/Mitral Clip   History of Present Illness:  Hannah [name] is a 74 y.o. female with a history of chroni diastolic CHF, HTN, dementia, GI bleeding,anxiety and severe mitral regurgitation s/p MitraClip (02/02/19) who presents to clinic for follow up. She has significant memory loss issues no longer driving GI bleeding with meseneric artery coil embolization Had multiple hospitalizations for CHF beginning July 2020  She was evaluated by the multidisciplinary valve team and underwent successful transcatheter edge-to-edge mitral valve repair with a single MitraClip NTR placed A2/P2, reducing MR from severe at baseline to 1-2+, LA V wave reduced by 50% from baseline.  Post op echo showed EF 30-35%, with mild residual MR and moderate MS (transmitral gradient 7 mm HG). Given reduction in EF, her lopressor was changed to Toprol Xl 25mg  daily and Losartan 25mg  daily was added. She was discharged on aspirin and plavix.   Readmitted from 11/3-11/10/20 for symptomatic anemia 2/2 GI bleed. Hg down to 5.7. She was transfused. FOBT positive. Enteroscopy was performed on 11/6 showing normal esophagus, normal stomach, normal duodenum; however, multiple angiodysplastic lesions with stigmata of recent bleeding were found in the proximal jejunum. Coagulation was successful.  She was continued on Protonix 40mg  daily. Patient's hemoglobin remained around 11 for 3 days prior to discharge. She had AKI and losartan discontinued and lasix decreased to 40mg  daily. Plavix was discontinued and she was discharged to [facility]. Daughter now living with her ? Another sibling had emptied her bank account  Most recent Hb 11.4 on 06/19/19   ***    Past  Medical History:  Diagnosis Date  . Anemia   . Anemia of chronic disease   . Anxiety   . Arthritis    "hands" (05/09/2015)  . Chronic diastolic heart failure ([place])   . CKD (chronic kidney disease) stage 3, GFR 30-59 ml/min   . Dementia ([place])   . Elevated TSH 02/03/2018  . GERD (gastroesophageal reflux disease)   . HLD (hyperlipidemia)   . Hypertension   . LBBB (left bundle branch block)   . Mitral valve insufficiency   . Nonrheumatic mitral valve regurgitation 02/02/2019  . Osteoarthritis   . 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 S/P mitral valve repair    s/p MitraClip on 02/03/19  . Vitamin B12 deficiency 12/28/2018    Past Surgical History:  Procedure Laterality Date  . BIOPSY  10/28/2018   Procedure: BIOPSY;  Surgeon: Juanita Craver, MD;  Location: [facility] ENDOSCOPY;  Service: Endoscopy;;  . CARDIAC CATHETERIZATION  05/2004  . COLONOSCOPY  07/2002   Archie Endo 09/02/2010  . COLONOSCOPY WITH PROPOFOL N/A 10/28/2018   Procedure: COLONOSCOPY WITH PROPOFOL;  Surgeon: Juanita Craver, MD;  Location: [facility] ENDOSCOPY;  Service: Endoscopy;  Laterality: N/A;  . ENTEROSCOPY N/A 02/24/2019   Procedure: ENTEROSCOPY;  Surgeon: Carol Ada, MD;  Location: [place];  Service: Endoscopy;  Laterality: N/A;  . ENTEROSCOPY N/A 05/23/2019   Procedure: ENTEROSCOPY;  Surgeon: Carol Ada, MD;  Location: [facility] ENDOSCOPY;  Service: Endoscopy;  Laterality: N/A;  . ESOPHAGOGASTRODUODENOSCOPY  07/2002  w/biopsies/notes 09/02/2010  . ESOPHAGOGASTRODUODENOSCOPY (EGD) WITH PROPOFOL N/A 10/28/2018   Procedure: ESOPHAGOGASTRODUODENOSCOPY (EGD) WITH PROPOFOL;  Surgeon: Juanita Craver, MD;  Location: [facility] ENDOSCOPY;  Service: Endoscopy;  Laterality: N/A;  . GIVENS CAPSULE STUDY N/A 02/23/2019   Procedure: GIVENS CAPSULE STUDY;  Surgeon: Carol Ada, MD;  Location: [place];  Service: Endoscopy;  Laterality: N/A;  . HOT HEMOSTASIS N/A 10/28/2018   Procedure: HOT HEMOSTASIS (ARGON PLASMA COAGULATION/BICAP);  Surgeon: Juanita Craver, MD;   Location: [facility] ENDOSCOPY;  Service: Endoscopy;  Laterality: N/A;  . HOT HEMOSTASIS N/A 02/24/2019   Procedure: HOT HEMOSTASIS (ARGON PLASMA COAGULATION/BICAP);  Surgeon: Carol Ada, MD;  Location: [place];  Service: Endoscopy;  Laterality: N/A;  . HOT HEMOSTASIS N/A 05/23/2019   Procedure: HOT HEMOSTASIS (ARGON PLASMA COAGULATION/BICAP);  Surgeon: Carol Ada, MD;  Location: [place];  Service: Endoscopy;  Laterality: N/A;  . IR ANGIOGRAM SELECTIVE EACH ADDITIONAL VESSEL  10/30/2018  . IR ANGIOGRAM SELECTIVE EACH ADDITIONAL VESSEL  10/30/2018  . IR ANGIOGRAM SELECTIVE EACH ADDITIONAL VESSEL  10/30/2018  . IR ANGIOGRAM SELECTIVE EACH ADDITIONAL VESSEL  10/30/2018  . IR ANGIOGRAM VISCERAL SELECTIVE  10/30/2018  . IR EMBO ART  VEN HEMORR LYMPH EXTRAV  INC GUIDE ROADMAPPING  10/30/2018  . IR US GUIDANCE  10/30/2018  . MITRAL VALVE REPAIR N/A 02/02/2019   Procedure: MITRAL VALVE REPAIR;  Surgeon: Sherren Mocha, MD;  Location: [place] CV LAB;  Service: Cardiovascular;  Laterality: N/A;  . POLYPECTOMY  10/28/2018   Procedure: POLYPECTOMY;  Surgeon: Juanita Craver, MD;  Location: [facility] ENDOSCOPY;  Service: Endoscopy;;  . RIGHT/LEFT HEART CATH AND CORONARY ANGIOGRAPHY N/A 12/29/2018   Procedure: RIGHT/LEFT HEART CATH AND CORONARY ANGIOGRAPHY;  Surgeon: Leonie Man, MD;  Location: [place] CV LAB;  Service: Cardiovascular;  Laterality: N/A;  . TEE WITHOUT CARDIOVERSION N/A 12/30/2018   Procedure: TRANSESOPHAGEAL ECHOCARDIOGRAM (TEE);  Surgeon: Lelon Perla, MD;  Location: [facility] ENDOSCOPY;  Service: Cardiovascular;  Laterality: N/A;    Current Medications: Outpatient Medications Prior to Visit  Medication Sig Dispense Refill  . acetaminophen (TYLENOL) 325 MG tablet Take 650 mg by mouth every 6 (six) hours as needed for mild pain or headache.    Marland Kitchen atorvastatin (LIPITOR) 40 MG tablet Take 1 tablet (40 mg total) by mouth daily at 6 PM. 90 tablet 1  . busPIRone (BUSPAR) 5 MG tablet Take 1  tablet (5 mg total) by mouth 2 (two) times daily. 180 tablet 1  . colchicine 0.6 MG tablet Take one tablet every 8 hours for 3 doses then one tablet every 12 hours for 4 doses then one daily 12 tablet 0  . cyanocobalamin 1000 MCG tablet Take 1 tablet (1,000 mcg total) by mouth daily. 90 tablet 0  . ferrous sulfate 324 MG TBEC Take 1 tablet (324 mg total) by mouth daily. 90 tablet 1  . furosemide (LASIX) 40 MG tablet Take 1 tablet (40 mg total) by mouth daily. 90 tablet 1  . metoprolol succinate (TOPROL-XL) 50 MG 24 hr tablet Take 1 tablet (50 mg total) by mouth daily. Take with or immediately following a meal. 90 tablet 3  . Multiple Vitamin (MULTIVITAMIN) tablet Take 1 tablet by mouth daily.    . pantoprazole (PROTONIX) 40 MG tablet Take 1 tablet (40 mg total) by mouth daily. 90 tablet 1  . ramelteon (ROZEREM) 8 MG tablet Take 1 tablet (8 mg total) by mouth at bedtime. 30 tablet 0   No facility-administered medications prior to visit.  Allergies:   Plavix [clopidogrel]   Social History   Socioeconomic History  . Marital status: Widowed    Spouse name: Chrissie Noa  . Number of children: 4  . Years of education: 66  . Highest education level: High school graduate  Occupational History  . Occupation: Retired     Comment: Press photographer  Tobacco Use  . Smoking status: Former Smoker    Packs/day: 0.30    Years: 8.00    Pack years: 2.40    Types: Cigarettes    Quit date: 05/30/2004    Years since quitting: 15.1  . Smokeless tobacco: Never Used  Substance and Sexual Activity  . Alcohol use: No    Alcohol/week: 0.0 standard drinks  . Drug use: Never  . Sexual activity: Not Currently  Other Topics Concern  . Not on file  Social History Narrative   Emergency Contact: [name] [phone]   End of Life Plan: gave pt ad pamplet   Any pets: 1 Dog    Diet: pt has a varied diet, low consumption of carbs and fatty foods    Exercise: pt does not have a regular exercise routine    Seatbelts: pt  wears seat regularly in car   Hobbies: spending time with grandchildren      *Updated 09/26/2018*   Patients daughter [name] lives with her and assists her in driving to doctors apts and errands. Patient does have a renewed drivers licenses, but feels more comfortable riding with her daughter. Patient enjoys going out to eat and spending time with her grandchildren who live in [place]. Patient stated they come during the summer to see her. Patient is excited her son, Vicente Males, is about to finish school to become a Theme park manager.    The patient has great family support system.    Social Determinants of Health   Financial Resource Strain: Low Risk   . Difficulty of Paying Living Expenses: Not hard at all  Food Insecurity: No Food Insecurity  . Worried About Charity fundraiser in the Last Year: Never true  . Ran Out of Food in the Last Year: Never true  Transportation Needs: No Transportation Needs  . Lack of Transportation (Medical): No  . Lack of Transportation (Non-Medical): No  Physical Activity: Inactive  . Days of Exercise per Week: 0 days  . Minutes of Exercise per Session: 0 min  Stress: No Stress Concern Present  . Feeling of Stress : Not at all  Social Connections: Slightly Isolated  . Frequency of Communication with Friends and Family: More than three times a week  . Frequency of Social Gatherings with Friends and Family: More than three times a week  . Attends Religious Services: More than 4 times per year  . Active Member of Clubs or Organizations: Yes  . Attends Archivist Meetings: Never  . Marital Status: Widowed     Family History:  The patient's family history includes Breast cancer in her sister; Cancer in her sister; Heart disease in her mother; Hypertension in her father and sister.     ROS:   Please see the history of present illness.    ROS All other systems reviewed and are negative.   PHYSICAL EXAM:   VS:  There were no vitals taken for this visit.     Affect appropriate Healthy:  appears stated age 6: normal Neck supple with no adenopathy JVP normal no bruits no thyromegaly Lungs clear with no wheezing and good diaphragmatic motion Heart:  S1/S2 MR  murmur, no rub, gallop or click PMI normal Abdomen: benighn, BS positve, no tenderness, no AAA no bruit.  No HSM or HJR Distal pulses intact with no bruits No edema Neuro non-focal Skin warm and dry No muscular weakness    Wt Readings from Last 3 Encounters:  07/04/19 165 lb 12.8 oz (75.2 kg)  06/19/19 163 lb 3.2 oz (74 kg)  06/14/19 159 lb (72.1 kg)      Studies/Labs Reviewed:   EKG:   SR rate 89 PAC/PVC ICLBBB 06/14/19   Recent Labs: 12/09/2018: TSH 4.175 02/21/2019: B Natriuretic Peptide 258.0 02/28/2019: Magnesium 2.0 05/26/2019: ALT 13; BUN 21; Creatinine, Ser 1.07; Potassium 4.2; Sodium 141 06/19/2019: Hemoglobin 11.4; Platelets 224   Lipid Panel    Component Value Date/Time   CHOL 127 12/09/2018 0716   CHOL 231 (H) 10/14/2016 0930   TRIG 44 12/09/2018 0716   HDL 55 12/09/2018 0716   HDL 52 10/14/2016 0930   CHOLHDL 2.3 12/09/2018 0716   VLDL 9 12/09/2018 0716   LDLCALC 63 12/09/2018 0716   LDLCALC 151 (H) 10/14/2016 0930    Additional studies/ records that were reviewed today include:  02/02/19 MITRAL VALVE REPAIR  Conclusion Successful transcatheter edge-to-edge mitral valve repair with a single MitraClip NTR placed A2/P2, reducing MR from severe at baseline to 1-2+, LA V wave reduced by 50% from baseline  Recommendations  Antiplatelet/Anticoag Recommend uninterrupted dual antiplatelet therapy with Aspirin 81mg  daily and Clopidogrel 75mg  daily for 6 months.    _____________    Echo 02/03/19:  IMPRESSIONS 1. Left ventricular ejection fraction, by visual estimation, is 30 to 35%. The left ventricle has moderately decreased function. Left ventricular septal wall thickness was normal. There is mildly increased left ventricular hypertrophy. 2.  Left ventricular diastolic function could not be evaluated pattern of LV diastolic filling. 3. Global right ventricle has normal systolic function.The right ventricular size is normal. No increase in right ventricular wall thickness. 4. Left atrial size was severely dilated. 5. Right atrial size was mildly dilated. 6. Trivial pericardial effusion is present. 7. The mitral valve has been repaired/replaced. Mild mitral valve regurgitation. Moderate mitral stenosis. 8. The tricuspid valve is normal in structure. Tricuspid valve regurgitation is mild. 9. The aortic valve is normal in structure. Aortic valve regurgitation was not visualized by color flow Doppler. 10. The pulmonic valve was not assessed. Pulmonic valve regurgitation was not assessed by color flow Doppler. 11. Aortic root could not be assessed. 12. Day 1 post MitraClip placement. Mitral regurgitation is mild. Transmitral gradients are elevated at 7 mmHg. LVEF decreased from prior the procedure from 50-55% to 30-35% with paradoxical septal motion. No pericardial effusion. 13. Iatrogenic left to right shunting.  _____________   03/02/19 IMPRESSIONS    1. Left ventricular ejection fraction, by visual estimation, is 30 to 35%. The left ventricle has moderately decreased function. There is no left ventricular hypertrophy.  2. There is akinesis of the inferoseptum and anteroseptum and global diffuse hypokinesis.  3. Left ventricular diastolic parameters are consistent with Grade II diastolic dysfunction (pseudonormalization).  4. Elevated left atrial and left ventricular end-diastolic pressures.  5. Global right ventricle has normal systolic function.The right ventricular size is normal. No increase in right ventricular wall thickness.  6. Left atrial size was severely dilated.  7. Right atrial size was normal.  8. S/P MitralClip placement. The mean transmitral valve gradient is 83mmHg. Mild to moderate mitral valve  regurgitation.  9. The tricuspid valve is normal in structure. Tricuspid valve  regurgitation is trivial. 10. The aortic valve is normal in structure. Aortic valve regurgitation is not visualized. No evidence of aortic valve sclerosis or stenosis. 11. The pulmonic valve was normal in structure. Pulmonic valve regurgitation is not visualized. 12. Normal pulmonary artery systolic pressure. 13. The inferior vena cava is normal in size with greater than 50% respiratory variability, suggesting right atrial pressure of 3 mmHg.   ASSESSMENT & PLAN:   Severe MR s/p MitraClip: she is now on only monotherapy with aspirin given recent GI bleed with aspirin and plavix. SBE prophylaxis discussed; I have RX'd amoxicillin. She has NYHA class I but has not been very active.  Echo 03/02/19  showed EF 30-35%, normally functioning MitraClip mild/moderate MR and mild MS with a mean gradient 5 mm HG.    Chronic combined S/D CHF: appears euvolemic. Not on ARB due to renal failure and low BP  Continue lasix and beta blocker   CKD stage III: improved Cr 1.07 05/26/19   Chronic anemia with hx of recent GI bleed: recent enteroscopy showed multiple angiodysplastic lesions with stigmata of recent bleeding s/p coagulation. Hg has remained stable and she is feeling much better. FU Hb GI/primary Hct 37.8  on 3/1//21 Last transfusion appears to have been 05/29/19   Medication Adjustments/Labs and Tests Ordered: Increase lopressor to 50 mg daily   F/U with structural PA 6 months and me in a year    Signed, Jenkins Rouge, MD  08/07/2019 4:05 PM    [facility] [address], [place], [place]  [address] Phone: [phone]; Fax: [phone]

## 2019-08-14 ENCOUNTER — Ambulatory Visit: Payer: Medicare Other | Admitting: Cardiovascular Disease

## 2019-08-17 DIAGNOSIS — H18413 Arcus senilis, bilateral: Secondary | ICD-10-CM | POA: Diagnosis not present

## 2019-08-17 DIAGNOSIS — H25043 Posterior subcapsular polar age-related cataract, bilateral: Secondary | ICD-10-CM | POA: Diagnosis not present

## 2019-08-17 DIAGNOSIS — H25013 Cortical age-related cataract, bilateral: Secondary | ICD-10-CM | POA: Diagnosis not present

## 2019-08-17 DIAGNOSIS — H2513 Age-related nuclear cataract, bilateral: Secondary | ICD-10-CM | POA: Diagnosis not present

## 2019-08-17 DIAGNOSIS — H2511 Age-related nuclear cataract, right eye: Secondary | ICD-10-CM | POA: Diagnosis not present

## 2019-08-23 DIAGNOSIS — H2513 Age-related nuclear cataract, bilateral: Secondary | ICD-10-CM | POA: Diagnosis not present

## 2019-08-23 DIAGNOSIS — K219 Gastro-esophageal reflux disease without esophagitis: Secondary | ICD-10-CM | POA: Diagnosis not present

## 2019-08-23 DIAGNOSIS — E785 Hyperlipidemia, unspecified: Secondary | ICD-10-CM | POA: Diagnosis not present

## 2019-08-23 DIAGNOSIS — H2511 Age-related nuclear cataract, right eye: Secondary | ICD-10-CM | POA: Diagnosis not present

## 2019-08-23 DIAGNOSIS — E7800 Pure hypercholesterolemia, unspecified: Secondary | ICD-10-CM | POA: Diagnosis not present

## 2019-08-23 DIAGNOSIS — D649 Anemia, unspecified: Secondary | ICD-10-CM | POA: Diagnosis not present

## 2019-08-23 DIAGNOSIS — Z87891 Personal history of nicotine dependence: Secondary | ICD-10-CM | POA: Diagnosis not present

## 2019-08-23 DIAGNOSIS — I10 Essential (primary) hypertension: Secondary | ICD-10-CM | POA: Diagnosis not present

## 2019-08-23 DIAGNOSIS — I4891 Unspecified atrial fibrillation: Secondary | ICD-10-CM | POA: Diagnosis not present

## 2019-08-24 ENCOUNTER — Other Ambulatory Visit: Payer: Self-pay | Admitting: Adult Health

## 2019-08-24 DIAGNOSIS — I5042 Chronic combined systolic (congestive) and diastolic (congestive) heart failure: Secondary | ICD-10-CM

## 2019-08-24 DIAGNOSIS — H2589 Other age-related cataract: Secondary | ICD-10-CM | POA: Diagnosis not present

## 2019-09-13 DIAGNOSIS — H2512 Age-related nuclear cataract, left eye: Secondary | ICD-10-CM | POA: Diagnosis not present

## 2019-10-02 ENCOUNTER — Observation Stay (HOSPITAL_COMMUNITY)
Admission: EM | Admit: 2019-10-02 | Discharge: 2019-10-04 | Disposition: A | Discharge status: Home / Self Care | Payer: Medicare Other | Attending: Family Medicine | Admitting: Family Medicine

## 2019-10-02 ENCOUNTER — Other Ambulatory Visit: Payer: Self-pay

## 2019-10-02 ENCOUNTER — Encounter (HOSPITAL_COMMUNITY): Payer: Self-pay | Admitting: *Deleted

## 2019-10-02 ENCOUNTER — Emergency Department (HOSPITAL_COMMUNITY): Payer: Medicare Other

## 2019-10-02 DIAGNOSIS — D638 Anemia in other chronic diseases classified elsewhere: Secondary | ICD-10-CM | POA: Insufficient documentation

## 2019-10-02 DIAGNOSIS — Z87891 Personal history of nicotine dependence: Secondary | ICD-10-CM | POA: Insufficient documentation

## 2019-10-02 DIAGNOSIS — I447 Left bundle-branch block, unspecified: Secondary | ICD-10-CM | POA: Diagnosis not present

## 2019-10-02 DIAGNOSIS — Z886 Allergy status to analgesic agent status: Secondary | ICD-10-CM | POA: Diagnosis not present

## 2019-10-02 DIAGNOSIS — M19042 Primary osteoarthritis, left hand: Secondary | ICD-10-CM | POA: Diagnosis not present

## 2019-10-02 DIAGNOSIS — M19041 Primary osteoarthritis, right hand: Secondary | ICD-10-CM | POA: Insufficient documentation

## 2019-10-02 DIAGNOSIS — R911 Solitary pulmonary nodule: Secondary | ICD-10-CM | POA: Insufficient documentation

## 2019-10-02 DIAGNOSIS — E079 Disorder of thyroid, unspecified: Secondary | ICD-10-CM | POA: Diagnosis not present

## 2019-10-02 DIAGNOSIS — I509 Heart failure, unspecified: Secondary | ICD-10-CM | POA: Diagnosis not present

## 2019-10-02 DIAGNOSIS — I5042 Chronic combined systolic (congestive) and diastolic (congestive) heart failure: Secondary | ICD-10-CM | POA: Diagnosis not present

## 2019-10-02 DIAGNOSIS — M109 Gout, unspecified: Secondary | ICD-10-CM | POA: Insufficient documentation

## 2019-10-02 DIAGNOSIS — J90 Pleural effusion, not elsewhere classified: Secondary | ICD-10-CM | POA: Diagnosis not present

## 2019-10-02 DIAGNOSIS — R778 Other specified abnormalities of plasma proteins: Secondary | ICD-10-CM

## 2019-10-02 DIAGNOSIS — E785 Hyperlipidemia, unspecified: Secondary | ICD-10-CM | POA: Diagnosis not present

## 2019-10-02 DIAGNOSIS — Z8249 Family history of ischemic heart disease and other diseases of the circulatory system: Secondary | ICD-10-CM | POA: Insufficient documentation

## 2019-10-02 DIAGNOSIS — R079 Chest pain, unspecified: Secondary | ICD-10-CM | POA: Diagnosis not present

## 2019-10-02 DIAGNOSIS — N1832 Chronic kidney disease, stage 3b: Secondary | ICD-10-CM | POA: Diagnosis not present

## 2019-10-02 DIAGNOSIS — Z20822 Contact with and (suspected) exposure to covid-19: Secondary | ICD-10-CM | POA: Insufficient documentation

## 2019-10-02 DIAGNOSIS — R0789 Other chest pain: Secondary | ICD-10-CM | POA: Diagnosis not present

## 2019-10-02 DIAGNOSIS — G4700 Insomnia, unspecified: Secondary | ICD-10-CM | POA: Insufficient documentation

## 2019-10-02 DIAGNOSIS — Z79899 Other long term (current) drug therapy: Secondary | ICD-10-CM | POA: Diagnosis not present

## 2019-10-02 DIAGNOSIS — R0902 Hypoxemia: Secondary | ICD-10-CM | POA: Diagnosis not present

## 2019-10-02 DIAGNOSIS — R0602 Shortness of breath: Secondary | ICD-10-CM | POA: Diagnosis not present

## 2019-10-02 DIAGNOSIS — Z888 Allergy status to other drugs, medicaments and biological substances status: Secondary | ICD-10-CM | POA: Diagnosis not present

## 2019-10-02 DIAGNOSIS — F419 Anxiety disorder, unspecified: Secondary | ICD-10-CM | POA: Insufficient documentation

## 2019-10-02 DIAGNOSIS — K219 Gastro-esophageal reflux disease without esophagitis: Secondary | ICD-10-CM | POA: Insufficient documentation

## 2019-10-02 DIAGNOSIS — I130 Hypertensive heart and chronic kidney disease with heart failure and stage 1 through stage 4 chronic kidney disease, or unspecified chronic kidney disease: Secondary | ICD-10-CM | POA: Diagnosis not present

## 2019-10-02 DIAGNOSIS — I110 Hypertensive heart disease with heart failure: Secondary | ICD-10-CM | POA: Diagnosis not present

## 2019-10-02 DIAGNOSIS — R7989 Other specified abnormal findings of blood chemistry: Secondary | ICD-10-CM | POA: Diagnosis not present

## 2019-10-02 DIAGNOSIS — F0151 Vascular dementia with behavioral disturbance: Secondary | ICD-10-CM | POA: Diagnosis not present

## 2019-10-02 DIAGNOSIS — R000 Tachycardia, unspecified: Secondary | ICD-10-CM | POA: Diagnosis not present

## 2019-10-02 LAB — BASIC METABOLIC PANEL
Anion gap: 13 (ref 5–15)
BUN: 18 mg/dL (ref 8–23)
CO2: 17 mmol/L — ABNORMAL LOW (ref 22–32)
Calcium: 8.9 mg/dL (ref 8.9–10.3)
Chloride: 112 mmol/L — ABNORMAL HIGH (ref 98–111)
Creatinine, Ser: 0.99 mg/dL (ref 0.44–1.00)
GFR calc Af Amer: >60 mL/min (ref >60)
GFR calc non Af Amer: 55 mL/min — ABNORMAL LOW (ref >60)
Glucose, Bld: 152 mg/dL — ABNORMAL HIGH (ref 70–99)
Potassium: 3.7 mmol/L (ref 3.5–5.1)
Sodium: 142 mmol/L (ref 135–145)

## 2019-10-02 LAB — SARS CORONAVIRUS 2 BY RT PCR (HOSPITAL ORDER, PERFORMED IN ~~LOC~~ HOSPITAL LAB): SARS Coronavirus 2: NEGATIVE

## 2019-10-02 LAB — CBC
HCT: 37.5 % (ref 36.0–46.0)
HCT: 39.7 % (ref 36.0–46.0)
Hemoglobin: 12 g/dL (ref 12.0–15.0)
Hemoglobin: 12.8 g/dL (ref 12.0–15.0)
MCH: 32.1 pg (ref 26.0–34.0)
MCH: 32.6 pg (ref 26.0–34.0)
MCHC: 32 g/dL (ref 30.0–36.0)
MCHC: 32.2 g/dL (ref 30.0–36.0)
MCV: 100.3 fL — ABNORMAL HIGH (ref 80.0–100.0)
MCV: 101 fL — ABNORMAL HIGH (ref 80.0–100.0)
Platelets: 263 10*3/uL (ref 150–400)
Platelets: 284 10*3/uL (ref 150–400)
RBC: 3.74 MIL/uL — ABNORMAL LOW (ref 3.87–5.11)
RBC: 3.93 MIL/uL (ref 3.87–5.11)
RDW: 13.2 % (ref 11.5–15.5)
RDW: 13.3 % (ref 11.5–15.5)
WBC: 13.5 10*3/uL — ABNORMAL HIGH (ref 4.0–10.5)
WBC: 12.3 10*3/uL — ABNORMAL HIGH (ref 4.0–10.5)
nRBC: 0 % (ref 0.0–0.2)
nRBC: 0 % (ref 0.0–0.2)

## 2019-10-02 LAB — CREATININE, SERUM
Creatinine, Ser: 1.16 mg/dL — ABNORMAL HIGH (ref 0.44–1.00)
GFR calc Af Amer: 53 mL/min — ABNORMAL LOW (ref >60)
GFR calc non Af Amer: 46 mL/min — ABNORMAL LOW (ref >60)

## 2019-10-02 LAB — TROPONIN I (HIGH SENSITIVITY)
Troponin I (High Sensitivity): 34 ng/L — ABNORMAL HIGH (ref <18)
Troponin I (High Sensitivity): 42 ng/L — ABNORMAL HIGH (ref <18)
Troponin I (High Sensitivity): 44 ng/L — ABNORMAL HIGH (ref <18)

## 2019-10-02 LAB — BRAIN NATRIURETIC PEPTIDE: B Natriuretic Peptide: 1087.1 pg/mL — ABNORMAL HIGH (ref 0.0–100.0)

## 2019-10-02 MED ORDER — SODIUM CHLORIDE 0.9% FLUSH: 3.0000 mL | INTRAVENOUS | Status: DC | PRN

## 2019-10-02 MED ORDER — BUSPIRONE HCL 5 MG PO TABS
5.0000 mg | ORAL_TABLET | Freq: Two times a day (BID) | ORAL | Status: DC
  Administered 2019-10-02 – 2019-10-04 (×5): 5 mg via ORAL
  Filled 2019-10-02 (×5): qty 1

## 2019-10-02 MED ORDER — ACETAMINOPHEN 325 MG PO TABS: 650.0000 mg | ORAL_TABLET | Freq: Four times a day (QID) | ORAL | Status: DC | PRN

## 2019-10-02 MED ORDER — SODIUM CHLORIDE 0.9% FLUSH: 3.0000 mL | Freq: Two times a day (BID) | INTRAVENOUS | Status: DC

## 2019-10-02 MED ORDER — ADULT MULTIVITAMIN W/MINERALS CH
1.0000 | ORAL_TABLET | Freq: Every day | ORAL | Status: DC
  Administered 2019-10-02 – 2019-10-04 (×3): 1 via ORAL
  Filled 2019-10-02 (×3): qty 1

## 2019-10-02 MED ORDER — ENOXAPARIN SODIUM 40 MG/0.4ML ~~LOC~~ SOLN
40.0000 mg | SUBCUTANEOUS | Status: DC
  Administered 2019-10-03 – 2019-10-04 (×2): 40 mg via SUBCUTANEOUS
  Filled 2019-10-02 (×2): qty 0.4

## 2019-10-02 MED ORDER — FUROSEMIDE 10 MG/ML IJ SOLN
40.0000 mg | INTRAMUSCULAR | Status: AC
  Administered 2019-10-02: 40 mg via INTRAVENOUS
  Filled 2019-10-02: qty 4

## 2019-10-02 MED ORDER — COLCHICINE 0.6 MG PO TABS
0.6000 mg | ORAL_TABLET | Freq: Every day | ORAL | Status: DC
  Administered 2019-10-02 – 2019-10-04 (×3): 0.6 mg via ORAL
  Filled 2019-10-02 (×3): qty 1

## 2019-10-02 MED ORDER — IOHEXOL 350 MG/ML SOLN
100.0000 mL | Freq: Once | INTRAVENOUS | Status: AC | PRN
  Administered 2019-10-02: 100 mL via INTRAVENOUS

## 2019-10-02 MED ORDER — ONDANSETRON HCL 4 MG/2ML IJ SOLN: 4.0000 mg | Freq: Four times a day (QID) | INTRAMUSCULAR | Status: DC | PRN

## 2019-10-02 MED ORDER — FUROSEMIDE 10 MG/ML IJ SOLN
40.0000 mg | Freq: Two times a day (BID) | INTRAMUSCULAR | Status: DC
  Administered 2019-10-02 – 2019-10-03 (×3): 40 mg via INTRAVENOUS
  Filled 2019-10-02 (×3): qty 4

## 2019-10-02 MED ORDER — SODIUM CHLORIDE 0.9 % IV SOLN: 250.0000 mL | INTRAVENOUS | Status: DC | PRN

## 2019-10-02 MED ORDER — SODIUM CHLORIDE 0.9% FLUSH
3.0000 mL | Freq: Once | INTRAVENOUS | Status: AC
  Administered 2019-10-02: 3 mL via INTRAVENOUS

## 2019-10-02 MED ORDER — ACETAMINOPHEN 325 MG PO TABS: 650.0000 mg | ORAL_TABLET | ORAL | Status: DC | PRN

## 2019-10-02 MED ORDER — VITAMIN B-12 1000 MCG PO TABS
1000.0000 ug | ORAL_TABLET | Freq: Every day | ORAL | Status: DC
  Administered 2019-10-02 – 2019-10-04 (×3): 1000 ug via ORAL
  Filled 2019-10-02 (×3): qty 1

## 2019-10-02 MED ORDER — PANTOPRAZOLE SODIUM 40 MG PO TBEC
40.0000 mg | DELAYED_RELEASE_TABLET | Freq: Every day | ORAL | Status: DC
  Administered 2019-10-02 – 2019-10-04 (×3): 40 mg via ORAL
  Filled 2019-10-02 (×3): qty 1

## 2019-10-02 MED ORDER — ZOLPIDEM TARTRATE 5 MG PO TABS
5.0000 mg | ORAL_TABLET | Freq: Once | ORAL | Status: AC
  Administered 2019-10-02: 5 mg via ORAL
  Filled 2019-10-02: qty 1

## 2019-10-02 MED ORDER — ATORVASTATIN CALCIUM 40 MG PO TABS
40.0000 mg | ORAL_TABLET | Freq: Every day | ORAL | Status: DC
  Administered 2019-10-02 – 2019-10-03 (×2): 40 mg via ORAL
  Filled 2019-10-02 (×2): qty 1

## 2019-10-02 MED ORDER — RAMELTEON 8 MG PO TABS
8.0000 mg | ORAL_TABLET | Freq: Every day | ORAL | Status: DC
  Administered 2019-10-02 – 2019-10-03 (×2): 8 mg via ORAL
  Filled 2019-10-02 (×3): qty 1

## 2019-10-02 MED ORDER — METOPROLOL SUCCINATE ER 50 MG PO TB24
50.0000 mg | ORAL_TABLET | Freq: Every day | ORAL | Status: DC
  Administered 2019-10-02 – 2019-10-04 (×3): 50 mg via ORAL
  Filled 2019-10-02: qty 2
  Filled 2019-10-02 (×2): qty 1

## 2019-10-02 MED ORDER — FERROUS SULFATE 325 (65 FE) MG PO TABS
325.0000 mg | ORAL_TABLET | Freq: Every day | ORAL | Status: DC
  Administered 2019-10-02 – 2019-10-04 (×3): 325 mg via ORAL
  Filled 2019-10-02 (×3): qty 1

## 2019-10-02 NOTE — ED Notes (Signed)
Purewick placed after admin of Lasix, pt insisted she cannot use Purewick and she needs to get up out of the bed. Assisted pt to bedside commode, pt with increased WOB and heart rate. Returned pt to bed. Discussed with patient she will have to use purewick or bedpan d/t increased exertion with activity.

## 2019-10-02 NOTE — H&P (Addendum)
[facility] Admission History and Physical Service Pager: [phone]  [name] Medical record number: [id number] Date of birth: 1946/02/03 Age: 74 y.o. Gender: female  Primary Care Provider: [name], MD Consultants: None Code Status: Full code Preferred Emergency Contact: [name] (Daughter)  Chief Complaint: Chest discomfort  Assessment and Plan: Hannah [name] is a 74 y.o. female presenting with chest pain 2/2 CHF exacerbation. PMH is significant for hx of symptomatic anemia, HFrEF s/p mitral valve repair 01/2019, CKD 3B, HTN, HLD, insomnia, dementia.  Chest pain 2/2 CHF exacerbation Patient is very poor historian and tells me she is unsure why she is here.  Per chart review patient presented with left-sided chest discomfort for several days.  She also noticed fatigue.  EMS was called due to worsening symptoms.  She was given nitro which took her pain from 7/10-4/10.  In the ED she had an EKG showing sinus or ectopic tachycardia with a left bundle branch block.  Chest x-ray showed no acute findings.  Out of concern for a PE a CT chest was obtained and showed no acute pulmonary embolism.  CT did show cardiomegaly suggestive of congestive heart failure including small to moderate sized bilateral pleural effusions right greater than left, dilated pulmonary vasculature which can be seen in patients with elevated pulmonary artery pressures. Given patient's clinical presentation I feel like symptoms are most likely related to acute congestive heart failure exacerbation given BNP of 1087.1. Unlikely STEMPI given EKG without ST changes and troponins trended 34>42. Hemoglobin hemoglobin 12.0. CTA negative for PE.  Patient received 40 mg IV Lasix in the emergency department.   -Admit to medical telemetry observation with Dr. Wilfrid Lund as attending -Consider cardiology consult -Continuous pulse ox -Continuous cardiac monitoring -Vitals per  floor routine -Supplemental O2 as needed -Strict I's and O's -Daily weights -Daily BMPs -Continue to trend troponins until downtrending -PT/OT eval and treat -Transitions of care consult placed - redose lasix this AM  HFrEF s/p mitral valve repair 01/2019 Patient's most recent echo was 03/02/2019 and showed an EF of 30-35% with normal functioning mitral clip.  Patient is followed by [facility].  Home meds include metoprolol 25 mg daily, Lasix 60 mg daily.  Per patient and her daughter she does not take Lasix daily.  Per chart review she is not on ACE or an ARB due to hypotensive episodes.  Patient was on DAPT with aspirin and Plavix but the Plavix was discontinued due to GI bleed.  Weight on admission has not yet been obtained.  Last weight recorded was 75.2 kg approximately 3 months ago.  BNP on admission was 1087.1.  Troponins trending 34> 42. -Continue to trend troponins until downtrending -Continue home metoprolol 25 mg daily  CKD 3B Creatinine on arrival was 0.99.  Baseline is 1.3-1.4. -Monitor with daily BMP -Avoid nephrotoxic agents  Hypertension Blood pressure on arrival was 143/67.  Blood pressures since arrival have ranged from 140/78-1 52/70.  Home medications include losartan, metoprolol, Lasix. -Continue home losartan -Continue home metoprolol  -Lasix 40 IV twice daily  Hyperlipidemia Home medications include atorvastatin 40 mg.  Most recent lipid panel was 12/09/2018 and showed cholesterol 127, HDL 55, LDL 63, triglycerides 44. -Continue home medications  Insomnia Home medications include trazodone -Holding trazodone while inpatient -May add ramelteon or melatonin if needed  Dementia Patient with significant history of dementia and is a poor historian at baseline. -We will contact daughter as needed  -  consider low dose benzo if becomes agitated  Hx of Symptomatic anemia Patient with multiple hospitalizations over the last year for symptomatic  anemia secondary to GI bleed.  She was on aspirin and Plavix for mitral valve repair but Plavix is has been DC'd due to GI bleed.  Hemoglobin on admission was 12.0.  No signs or symptoms of blood loss at this time. -Monitor for signs or symptoms of anemia  Incidental Pulmonary Nodule: CTA notable for a small right lower lobe pulmonary nodule measuring up to approximately 4 mm.  No follow-up needed if patient is low risk.  Noncontrast chest CT can be considered in 12 months if patient is high risk.   - recommend outpatient follow up if indicated  FEN/GI: Heart healthy diet Prophylaxis: Lovenox  Disposition: Admit to observation  History of Present Illness:  Hannah [name] is a 74 y.o. female presenting with chest discomfort and shortness of breath.  Patient is a very poor historian and is pleasantly demented.  She is unsure of why she is at the hospital.  I spoke with the patient's daughter who reported that she had been intermittently complaining of chest pain and discomfort over the last 2 days.  When EMS arrived she was unsure why they were there and that she told him that she was fine.  Patient still reports good compliance with the metoprolol but says that she is not on any home Lasix.  Denies any recent illnesses although her daughter reports that she has been coughing a little which she attributes to allergies and she has been giving her as needed Claritin.  Review Of Systems: Per HPI with the following additions:   Review of Systems  Constitutional: Negative for fever and unexpected weight change.  HENT: Positive for rhinorrhea. Negative for congestion.   Respiratory: Positive for cough and shortness of breath.   Cardiovascular: Positive for chest pain. Negative for palpitations and leg swelling.  Gastrointestinal: Negative for abdominal pain, constipation, diarrhea, nausea and vomiting.  Endocrine: Positive for polyuria.  Genitourinary: Positive for frequency. Negative for  difficulty urinating, dyspareunia and dysuria.  Musculoskeletal: Positive for arthralgias.  Neurological: Negative for dizziness, weakness and headaches.  Psychiatric/Behavioral:       Patient is pleasantly demented     Patient Active Problem List   Diagnosis Date Noted  . CHF (congestive heart failure) ([place]) 10/02/2019  . Anemia due to acute blood loss 03/09/2019  . HLD (hyperlipidemia)   . Adverse effect of antiplatelet agent 02/22/2019  . S/P mitral valve repair   . CKD (chronic kidney disease) stage 3, GFR 30-59 ml/min   . Vitamin B12 deficiency 12/28/2018  . Chronic combined systolic and diastolic congestive heart failure ([place])   . AKI (acute kidney injury) ([place])   . 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 Gout 09/11/2015  . Essential hypertension 08/09/2008  . GERD 08/09/2008    Past Medical History: Past Medical History:  Diagnosis Date  . Anemia   . Anemia of chronic disease   . Anxiety   . Arthritis    "hands" (05/09/2015)  . Chronic diastolic heart failure ([place])   . CKD (chronic kidney disease) stage 3, GFR 30-59 ml/min   . Dementia ([place])   . Elevated TSH 02/03/2018  . GERD (gastroesophageal reflux disease)   . HLD (hyperlipidemia)   . Hypertension   . LBBB (left bundle branch block)   . Mitral valve insufficiency   . Nonrheumatic mitral valve regurgitation 02/02/2019  .  Osteoarthritis   . 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 S/P mitral valve repair    s/p MitraClip on 02/03/19  . Vitamin B12 deficiency 12/28/2018    Past Surgical History: Past Surgical History:  Procedure Laterality Date  . BIOPSY  10/28/2018   Procedure: BIOPSY;  Surgeon: Juanita Craver, MD;  Location: [facility] ENDOSCOPY;  Service: Endoscopy;;  . CARDIAC CATHETERIZATION  05/2004  . COLONOSCOPY  07/2002   Archie Endo 09/02/2010  . COLONOSCOPY WITH PROPOFOL N/A 10/28/2018   Procedure: COLONOSCOPY WITH PROPOFOL;  Surgeon: Juanita Craver, MD;  Location: [facility] ENDOSCOPY;   Service: Endoscopy;  Laterality: N/A;  . ENTEROSCOPY N/A 02/24/2019   Procedure: ENTEROSCOPY;  Surgeon: Carol Ada, MD;  Location: [place];  Service: Endoscopy;  Laterality: N/A;  . ENTEROSCOPY N/A 05/23/2019   Procedure: ENTEROSCOPY;  Surgeon: Carol Ada, MD;  Location: [facility] ENDOSCOPY;  Service: Endoscopy;  Laterality: N/A;  . ESOPHAGOGASTRODUODENOSCOPY  07/2002   w/biopsies/notes 09/02/2010  . ESOPHAGOGASTRODUODENOSCOPY (EGD) WITH PROPOFOL N/A 10/28/2018   Procedure: ESOPHAGOGASTRODUODENOSCOPY (EGD) WITH PROPOFOL;  Surgeon: Juanita Craver, MD;  [facility] END[facility]COPY;  Service: Endoscopy;  Laterality: N/A;  . GIVENS CAPSULE STUDY N/A 02/23/2019   Procedure: GIVENS CAPSULE STUDY;  Surgeon: Carol Ada, MD;  Location: [place];  Service: Endoscopy;  Laterality: N/A;  . HOT HEMOSTASIS N/A 10/28/2018   Procedure: HOT HEMOSTASIS (ARGON PLASMA COAGULATION/BICAP);  Surgeon: Juanita Craver, MD;  Location: [facility] ENDOSCOPY;  Service: Endoscopy;  Laterality: N/A;  . HOT HEMOSTASIS N/A 02/24/2019   Procedure: HOT HEMOSTASIS (ARGON PLASMA COAGULATION/BICAP);  Surgeon: Carol Ada, MD;  Location: [place];  Service: Endoscopy;  Laterality: N/A;  . HOT HEMOSTASIS N/A 05/23/2019   Procedure: HOT HEMOSTASIS (ARGON PLASMA COAGULATION/BICAP);  Surgeon: Carol Ada, MD;  Location: [place];  Service: Endoscopy;  Laterality: N/A;  . IR ANGIOGRAM SELECTIVE EACH ADDITIONAL VESSEL  10/30/2018  . IR ANGIOGRAM SELECTIVE EACH ADDITIONAL VESSEL  10/30/2018  . IR ANGIOGRAM SELECTIVE EACH ADDITIONAL VESSEL  10/30/2018  . IR ANGIOGRAM SELECTIVE EACH ADDITIONAL VESSEL  10/30/2018  . IR ANGIOGRAM VISCERAL SELECTIVE  10/30/2018  . IR EMBO ART  VEN HEMORR LYMPH EXTRAV  INC GUIDE ROADMAPPING  10/30/2018  . IR US GUIDANCE  10/30/2018  . MITRAL VALVE REPAIR N/A 02/02/2019   Procedure: MITRAL VALVE REPAIR;  Surgeon: Sherren Mocha, MD;  Location: [place] CV LAB;  Service: Cardiovascular;  Laterality: N/A;  . POLYPECTOMY   10/28/2018   Procedure: POLYPECTOMY;  Surgeon: Juanita Craver, MD;  Location: [facility] ENDOSCOPY;  Service: Endoscopy;;  . RIGHT/LEFT HEART CATH AND CORONARY ANGIOGRAPHY N/A 12/29/2018   Procedure: RIGHT/LEFT HEART CATH AND CORONARY ANGIOGRAPHY;  Surgeon: Leonie Man, MD;  Location: [place] CV LAB;  Service: Cardiovascular;  Laterality: N/A;  . TEE WITHOUT CARDIOVERSION N/A 12/30/2018   Procedure: TRANSESOPHAGEAL ECHOCARDIOGRAM (TEE);  Surgeon: Lelon Perla, MD;  Location: [facility] ENDOSCOPY;  Service: Cardiovascular;  Laterality: N/A;    Social History: Social History   Tobacco Use  . Smoking status: Former Smoker    Packs/day: 0.30    Years: 8.00    Pack years: 2.40    Types: Cigarettes    Quit date: 05/30/2004    Years since quitting: 15.3  . Smokeless tobacco: Never Used  Vaping Use  . Vaping Use: Never used  Substance Use Topics  . Alcohol use: No    Alcohol/week: 0.0 standard drinks  . Drug use: Never   Additional social history:  Please also refer to relevant sections of EMR.  Family History: Family History  Problem Relation Age of Onset  . Heart disease Mother        died at age 80  . Breast cancer Sister   . Cancer Sister        Breast  . Hypertension Father   . Hypertension Sister     Allergies and Medications: Allergies  Allergen Reactions  . 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   Current Outpatient Medications on File Prior to Encounter  Medication Sig Dispense Refill  . acetaminophen (TYLENOL) 325 MG tablet Take 650 mg by mouth every 6 (six) hours as needed for mild pain or headache.    Marland Kitchen atorvastatin (LIPITOR) 40 MG tablet Take 1 tablet (40 mg total) by mouth daily at 6 PM. 90 tablet 1  . busPIRone (BUSPAR) 5 MG tablet Take 1 tablet (5 mg total) by mouth 2 (two) times daily. 180  tablet 1  . colchicine 0.6 MG tablet Take one tablet every 8 hours for 3 doses then one tablet every 12 hours for 4 doses then one daily (Patient taking differently: Take 0.6 mg by mouth daily. ) 12 tablet 0  . cyanocobalamin 1000 MCG tablet Take 1 tablet (1,000 mcg total) by mouth daily. 90 tablet 0  . ferrous sulfate 324 MG TBEC Take 1 tablet (324 mg total) by mouth daily. 90 tablet 1  . furosemide (LASIX) 40 MG tablet Take 1 tablet (40 mg total) by mouth daily. 90 tablet 1  . metoprolol succinate (TOPROL-XL) 50 MG 24 hr tablet Take 1 tablet (50 mg total) by mouth daily. Take with or immediately following a meal. 90 tablet 3  . Multiple Vitamin (MULTIVITAMIN) tablet Take 1 tablet by mouth daily.    . pantoprazole (PROTONIX) 40 MG tablet Take 1 tablet (40 mg total) by mouth daily. 90 tablet 1  . ramelteon (ROZEREM) 8 MG tablet Take 1 tablet (8 mg total) by mouth at bedtime. 30 tablet 0    Objective: BP (!) 143/67   Pulse (!) 116   Temp 99.2 F (37.3 C) (Oral)   Resp (!) 29   SpO2 98%  Physical Exam Vitals reviewed.  Constitutional:      General: She is not in acute distress.    Appearance: She is not ill-appearing or toxic-appearing.  HENT:     Head: Normocephalic and atraumatic.  Eyes:     Extraocular Movements: Extraocular movements intact.     Pupils: Pupils are equal, round, and reactive to light.  Cardiovascular:     Rate and Rhythm: Regular rhythm. Tachycardia present.     Heart sounds: Normal heart sounds.  Pulmonary:     Effort: Tachypnea present. No accessory muscle usage or respiratory distress.     Breath sounds: Normal breath sounds. No stridor.  Chest:     Chest wall: No mass or tenderness.  Abdominal:     General: Bowel sounds are normal.     Palpations: Abdomen is soft.  Musculoskeletal:     Cervical back: Normal range of motion and neck supple.     Comments: Trace edema in lower extremities bilaterally  Skin:    General: Skin is warm and dry.     Capillary  Refill: Capillary refill takes less than 2 seconds.  Neurological:     General: No focal deficit present.     Mental Status: She is alert.  Comments: Pleasantly demented, is unsure of why she is here.  Is very good at hiding her dementia.     Labs and Imaging: CBC BMET  Recent Labs  Lab 10/02/19 0048  WBC 13.5*  HGB 12.0  HCT 37.5  PLT 263   Recent Labs  Lab 10/02/19 0048  NA 142  K 3.7  CL 112*  CO2 17*  BUN 18  CREATININE 0.99  GLUCOSE 152*  CALCIUM 8.9     EKG: Sinus tachycardia  BNP-1087.1 Troponin- 34>42  CT Angio Chest PE W and/or Wo Contrast  Result Date: 10/02/2019 CLINICAL DATA:  Shortness of breath. EXAM: CT ANGIOGRAPHY CHEST WITH CONTRAST TECHNIQUE: Multidetector CT imaging of the chest was performed using the standard protocol during bolus administration of intravenous contrast. Multiplanar CT image reconstructions and MIPs were obtained to evaluate the vascular anatomy. CONTRAST:  154mL OMNIPAQUE IOHEXOL 350 MG/ML SOLN COMPARISON:  November 06, 2018. FINDINGS: Cardiovascular: Contrast injection is sufficient to demonstrate satisfactory opacification of the pulmonary arteries to the segmental level. There is no acute pulmonary embolism. The main pulmonary artery and right left pulmonary arteries are slightly dilated. There are coronary artery calcifications. There are mild calcifications of the thoracic aorta. The heart is enlarged. There is some reflux of contrast into the IVC. A mitral valve clip is noted. Mediastinum/Nodes: -- No mediastinal lymphadenopathy. -- No hilar lymphadenopathy. -- No axillary lymphadenopathy. -- No supraclavicular lymphadenopathy. -- Normal thyroid gland where visualized. -  Unremarkable esophagus. Lungs/Pleura: There are small bilateral pleural effusions. There is a slightly mosaic appearance of the lung parenchyma bilaterally. There is a small 3 mm pulmonary nodule in the right lower lobe (axial series 7, image 81). There is a stable 4 mm  pulmonary nodule in the right lower lobe (axial series 7, image 104). There is interlobular septal thickening bilaterally. There is no pneumothorax. Upper Abdomen: Contrast bolus timing is not optimized for evaluation of the abdominal organs. The visualized portions of the organs of the upper abdomen are normal. Musculoskeletal: No chest wall abnormality. No bony spinal canal stenosis. Review of the MIP images confirms the above findings. IMPRESSION: 1. No acute pulmonary embolism. 2. Cardiomegaly with findings suggestive of congestive heart failure including small to moderate-sized bilateral pleural effusions, right greater than left. 3. Dilated pulmonary vasculature which can be seen in patients with elevated pulmonary artery pressures. 4. Small right lower lobe pulmonary nodules measuring up to approximately 4 mm. No follow-up needed if patient is low-risk (and has no known or suspected primary neoplasm). Non-contrast chest CT can be considered in 12 months if patient is high-risk. This recommendation follows the consensus statement: Guidelines for Management of Incidental Pulmonary Nodules Detected on CT Images: From the Fleischner Society 2017; Radiology 2017; 284:228-243. Electronically Signed   By: Constance Holster M.D.   On: 10/02/2019 03:05   DG Chest Portable 1 View  Result Date: 10/02/2019 CLINICAL DATA:  Chest pain EXAM: PORTABLE CHEST 1 VIEW COMPARISON:  05/23/2019 FINDINGS: The heart size and mediastinal contours are within normal limits. Both lungs are clear. The visualized skeletal structures are unremarkable. IMPRESSION: No active disease. Electronically Signed   By: Ulyses Jarred M.D.   On: 10/02/2019 01:42    Gifford Shave, MD 10/02/2019, 4:18 AM PGY-1, [place] Intern pager: [phone], text pages welcome  Upper Level Addendum: I have seen and evaluated this patient along with Dr. Caron Presume and reviewed the above note, making necessary revisions in green.    Mina Marble, D.O.  10/02/2019, 7:00 AM PGY-2, [place]

## 2019-10-02 NOTE — ED Notes (Signed)
Patient used bedpan d/t not being able to "start peeing" with purewick.

## 2019-10-02 NOTE — ED Notes (Signed)
Lunch Tray Ordered @ 1043. 

## 2019-10-02 NOTE — Plan of Care (Signed)
?  Problem: Clinical Measurements: ?Goal: Respiratory complications will improve ?Outcome: Progressing ?  ?Problem: Elimination: ?Goal: Will not experience complications related to urinary retention ?Outcome: Progressing ?  ?Problem: Safety: ?Goal: Ability to remain free from injury will improve ?Outcome: Progressing ?  ?

## 2019-10-02 NOTE — Progress Notes (Signed)
PT Cancellation Note  Patient Details Name: Hannah [name] MRN: [id number] DOB: [birth year]   Cancelled Treatment:    Reason Eval/Treat Not Completed: Other (comment) Pt currently sleeping and RN requesting to hold. Will follow up as schedule allows.   Reuel Derby, PT, DPT  Acute Rehabilitation Services  Pager: [phone] Office: [phone]    Rudean Hitt 10/02/2019, 10:50 AM

## 2019-10-02 NOTE — ED Notes (Signed)
Pt sitting at edge of bed, had removed all wiring, attempting to get out of bed, says she needs to urinate. Discussed with patient again that she cannot get out of bed d/t her heart rate and respirations increasing with her activity. Pt increasingly anxious, placed on bed pan, urinating small amounts several times. Pt still having increased heart rate, respirations with activity. Repositioned in bed, comfort measures. Resting HR 110-117, BP stable. Denies pain.

## 2019-10-02 NOTE — Evaluation (Signed)
Occupational Therapy Evaluation Patient Details Name: Hannah [name] MRN: [id number] DOB: Apr 19, 1946 Today's Date: 10/02/2019    History of Present Illness Hannah [name] is a 74 y.o. female presenting with chest pain 2/2 CHF exacerbation. PMH is significant for hx of symptomatic anemia, HFrEF s/p mitral valve repair 01/2019, CKD 3B, HTN, HLD, insomnia, dementia   Clinical Impression   This 74 yo female admitted with above presents to acute OT with per her report being independent with basic ADLs, IADLs, and driving. Currently patient is min A-min guard A with ambulation (with needing to sit abruptly after going ~250 feet) and is setup/S- minguard A for basic ADLs. She will benefit from acute OT with follow up [place] but feel she needs 24 hour S to make sure she is functioning cognitively well in her own environment. We will continue to follow.    Follow Up Recommendations  Home health OT;Supervision/Assistance - 24 hour    Equipment Recommendations  None recommended by OT       Precautions / Restrictions Precautions Precautions: Fall Restrictions Weight Bearing Restrictions: No      Mobility Bed Mobility Overal bed mobility: Needs Assistance Bed Mobility: Sit to Supine       Sit to supine: Supervision      Transfers Overall transfer level: Needs assistance Equipment used: None Transfers: Sit to/from Stand Sit to Stand: Supervision         General transfer comment: min guard A for ambulation; had to stop and sit down after ambulating ~250 feet without AD (said she just got tired)    Balance Overall balance assessment: Needs assistance Sitting-balance support: No upper extremity supported;Feet supported Sitting balance-Leahy Scale: Good     Standing balance support: No upper extremity supported Standing balance-Leahy Scale: Fair                             ADL either performed or assessed with clinical judgement   ADL Overall ADL's : Needs  assistance/impaired Eating/Feeding: Independent;Sitting   Grooming: Set up;Sitting   Upper Body Bathing: Set up;Sitting   Lower Body Bathing: Supervison/ safety;Set up;Sit to/from stand   Upper Body Dressing : Set up;Sitting   Lower Body Dressing: Set up;Supervision/safety;Sit to/from stand   Toilet Transfer: Min guard;Ambulation   Toileting- Clothing Manipulation and Hygiene: Supervision/safety;Sit to/from stand               Vision Patient Visual Report: No change from baseline              Pertinent Vitals/Pain Pain Assessment: No/denies pain     Hand Dominance Right   Extremity/Trunk Assessment Upper Extremity Assessment Upper Extremity Assessment: Overall WFL for tasks assessed           Communication Communication Communication: No difficulties   Cognition Arousal/Alertness: Awake/alert Behavior During Therapy: WFL for tasks assessed/performed Overall Cognitive Status: Impaired/Different from baseline Area of Impairment: Orientation                 Orientation Level: Disoriented to;Place;Situation ("Is this a school" and did not know why she was here)       Safety/Judgement: Decreased awareness of safety     General Comments: Unplugging herself to get up and go to bathroom at bedside--not always calling for A (even though has been asked to). Does not know how to plug herself back in . Sitting at end of stretcher upon my arrival to her room. Very talkative.  Asked at the end of the session where she was earlier stating "my primary doctor and a friend of his and many other people were wherever I was"              Home Living Family/patient expects to be discharged to:: Private residence Living Arrangements: Other relatives (daughter) Available Help at Discharge: Family;Available PRN/intermittently Type of Home: House Home Access: Stairs to enter [employer] of Steps: 3-4 Entrance Stairs-Rails: Right;Left;Can reach both Home  Layout: One level     Bathroom Shower/Tub: Walk-in shower;Tub only   Bathroom Toilet: Standard     Home Equipment: Walker - 2 wheels;Wheelchair - manual;Shower seat;Grab bars - tub/shower          Prior Functioning/Environment Level of Independence: Independent        Comments: Still drives        OT Problem List: Decreased activity tolerance;Impaired balance (sitting and/or standing)      OT Treatment/Interventions: Self-care/ADL training;DME and/or AE instruction;Patient/family education;Balance training    OT Goals(Current goals can be found in the care plan section) Acute Rehab OT Goals Patient Stated Goal: to go home OT Goal Formulation: With patient Time For Goal Achievement: 10/16/19 Potential to Achieve Goals: Good  OT Frequency: Min 2X/week           Co-evaluation PT/OT/SLP Co-Evaluation/Treatment: Yes (partial) Reason for Co-Treatment: For patient/therapist safety PT goals addressed during session: Strengthening/ROM;Mobility/safety with mobility OT goals addressed during session: ADL's and self-care;Strengthening/ROM      AM-PAC OT "6 Clicks" Daily Activity     Outcome Measure Help from another person eating meals?: None Help from another person taking care of personal grooming?: A Little Help from another person toileting, which includes using toliet, bedpan, or urinal?: A Little Help from another person bathing (including washing, rinsing, drying)?: A Little Help from another person to put on and taking off regular upper body clothing?: A Little Help from another person to put on and taking off regular lower body clothing?: A Little 6 Click Score: 19   End of Session Equipment Utilized During Treatment: Gait belt Nurse Communication:  (Pt ambulated well up until 250 feet then needed to sit down quickly)  Activity Tolerance: Patient limited by fatigue (after 250 feet of ambulation without AD) Patient left: with call bell/phone within reach (on  stretcher in ED)  OT Visit Diagnosis: Unsteadiness on feet (R26.81);Muscle weakness (generalized) (M62.81)                Time: 1405-1430 OT Time Calculation (min): 25 min Charges:  OT General Charges $OT Visit: 1 Visit OT Evaluation $OT Eval Moderate Complexity: 1 Mod  Golden Circle, OTR/L Acute [employer] Pager [phone] Office [phone]     Almon Register 10/02/2019, 5:28 PM

## 2019-10-02 NOTE — Progress Notes (Signed)
OT Cancellation Note  Patient Details Name: DEVYN GRIFFING MRN: [id number] DOB: 1945-05-25   Cancelled Treatment:    Reason Eval/Treat Not Completed: Other (comment). Checked with RN and pt has just recently drifted off to sleep and would be better to let her sleep right now. RN to let us know when patient is awake.  Golden Circle, OTR/L Acute Rehab Services Pager [phone] Office [phone]     Almon Register 10/02/2019, 10:53 AM

## 2019-10-02 NOTE — ED Triage Notes (Signed)
PT arrives via [facility] from home, with c/o left sided chest pressure for 3 days. On ems arrival, c/o pain 8/10. Pressure 150/70. Given 1 nitro, reduced pain to 2/10, pressure 120/60. HR has been consistently in the 130's. 96% RA. IV established in the left hand.

## 2019-10-02 NOTE — ED Provider Notes (Signed)
[place] EMERGENCY DEPARTMENT Provider Note   CSN: [id number] Arrival date & time: 10/02/19  0044     History Chief Complaint  Patient presents with  . Chest Pain    Hannah [name] is a 74 y.o. female.  The history is provided by the patient and medical records.  Chest Pain   74 year old female with history of anemia, anxiety, arthritis, congestive heart failure, chronic kidney disease, thyroid disease, hypertension, B12 deficiency, presenting to the ED with chest pain.  Patient reports she has had some left-sided chest discomfort for a few days now.  States today she started feeling generally weak with fatigue, almost like she may pass out.  States she has had her daughter stay with her recently as she was afraid to be on her own.  EMS was called today due to worsening symptoms.  States initially pain was rated about a 7/10, she was given nitro and now down to about a 4/10.  She denies any significant weight gain or feeling of fluid retention.  She is not had any nighttime orthopnea.  She does report she has felt some palpitations recently and has been breathing faster than normal, she does report she tends to do this when she starts feeling unwell.  She does have anxiety and feels like that is contributing.  Past Medical History:  Diagnosis Date  . Anemia   . Anemia of chronic disease   . Anxiety   . Arthritis    "hands" (05/09/2015)  . Chronic diastolic heart failure ([place])   . CKD (chronic kidney disease) stage 3, GFR 30-59 ml/min   . Dementia ([place])   . Elevated TSH 02/03/2018  . GERD (gastroesophageal reflux disease)   . HLD (hyperlipidemia)   . Hypertension   . LBBB (left bundle branch block)   . Mitral valve insufficiency   . Nonrheumatic mitral valve regurgitation 02/02/2019  . Osteoarthritis   . 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 S/P mitral valve repair    s/p MitraClip on 02/03/19  . Vitamin B12 deficiency 12/28/2018    Patient Active Problem List    Diagnosis Date Noted  . Anemia due to acute blood loss 03/09/2019  . HLD (hyperlipidemia)   . Adverse effect of antiplatelet agent 02/22/2019  . S/P mitral valve repair   . CKD (chronic kidney disease) stage 3, GFR 30-59 ml/min   . Vitamin B12 deficiency 12/28/2018  . Chronic combined systolic and diastolic congestive heart failure ([place])   . AKI (acute kidney injury) ([place])   . Dementia due to atherosclerosis with behavioral disturbance ([place]) 02/02/2018  . Gout 09/11/2015  . Essential hypertension 08/09/2008  . GERD 08/09/2008    Past Surgical History:  Procedure Laterality Date  . BIOPSY  10/28/2018   Procedure: BIOPSY;  Surgeon: Juanita Craver, MD;  Location: [facility] ENDOSCOPY;  Service: Endoscopy;;  . CARDIAC CATHETERIZATION  05/2004  . COLONOSCOPY  07/2002   Archie Endo 09/02/2010  . COLONOSCOPY WITH PROPOFOL N/A 10/28/2018   Procedure: COLONOSCOPY WITH PROPOFOL;  Surgeon: Juanita Craver, MD;  Location: [facility] ENDOSCOPY;  Service: Endoscopy;  Laterality: N/A;  . ENTEROSCOPY N/A 02/24/2019   Procedure: ENTEROSCOPY;  Surgeon: Carol Ada, MD;  Location: [place];  Service: Endoscopy;  Laterality: N/A;  . ENTEROSCOPY N/A 05/23/2019   Procedure: ENTEROSCOPY;  Surgeon: Carol Ada, MD;  Location: [facility] ENDOSCOPY;  Service: Endoscopy;  Laterality: N/A;  . ESOPHAGOGASTRODUODENOSCOPY  07/2002   w/biopsies/notes 09/02/2010  . ESOPHAGOGASTRODUODENOSCOPY (EGD) WITH PROPOFOL N/A 10/28/2018   Procedure: ESOPHAGOGASTRODUODENOSCOPY (EGD)  WITH PROPOFOL;  Surgeon: Juanita Craver, MD;  Location: [facility] ENDOSCOPY;  Service: Endoscopy;  Laterality: N/A;  . GIVENS CAPSULE STUDY N/A 02/23/2019   Procedure: GIVENS CAPSULE STUDY;  Surgeon: Carol Ada, MD;  Location: [place];  Service: Endoscopy;  Laterality: N/A;  . HOT HEMOSTASIS N/A 10/28/2018   Procedure: HOT HEMOSTASIS (ARGON PLASMA COAGULATION/BICAP);  Surgeon: Juanita Craver, MD;  Location: [facility] ENDOSCOPY;  Service: Endoscopy;  Laterality: N/A;  . HOT HEMOSTASIS N/A  02/24/2019   Procedure: HOT HEMOSTASIS (ARGON PLASMA COAGULATION/BICAP);  Surgeon: Carol Ada, MD;  Location: [place];  Service: Endoscopy;  Laterality: N/A;  . HOT HEMOSTASIS N/A 05/23/2019   Procedure: HOT HEMOSTASIS (ARGON PLASMA COAGULATION/BICAP);  Surgeon: Carol Ada, MD;  Location: [place];  Service: Endoscopy;  Laterality: N/A;  . IR ANGIOGRAM SELECTIVE EACH ADDITIONAL VESSEL  10/30/2018  . IR ANGIOGRAM SELECTIVE EACH ADDITIONAL VESSEL  10/30/2018  . IR ANGIOGRAM SELECTIVE EACH ADDITIONAL VESSEL  10/30/2018  . IR ANGIOGRAM SELECTIVE EACH ADDITIONAL VESSEL  10/30/2018  . IR ANGIOGRAM VISCERAL SELECTIVE  10/30/2018  . IR EMBO ART  VEN HEMORR LYMPH EXTRAV  INC GUIDE ROADMAPPING  10/30/2018  . IR US GUIDANCE  10/30/2018  . MITRAL VALVE REPAIR N/A 02/02/2019   Procedure: MITRAL VALVE REPAIR;  Surgeon: Sherren Mocha, MD;  Location: [place] CV LAB;  Service: Cardiovascular;  Laterality: N/A;  . POLYPECTOMY  10/28/2018   Procedure: POLYPECTOMY;  Surgeon: Juanita Craver, MD;  Location: [facility] ENDOSCOPY;  Service: Endoscopy;;  . RIGHT/LEFT HEART CATH AND CORONARY ANGIOGRAPHY N/A 12/29/2018   Procedure: RIGHT/LEFT HEART CATH AND CORONARY ANGIOGRAPHY;  Surgeon: Leonie Man, MD;  Location: [place] CV LAB;  Service: Cardiovascular;  Laterality: N/A;  . TEE WITHOUT CARDIOVERSION N/A 12/30/2018   Procedure: TRANSESOPHAGEAL ECHOCARDIOGRAM (TEE);  Surgeon: Lelon Perla, MD;  Location: [facility] ENDOSCOPY;  Service: Cardiovascular;  Laterality: N/A;     OB History   No obstetric history on file.     Family History  Problem Relation Age of Onset  . Heart disease Mother        died at age 47  . Breast cancer Sister   . Cancer Sister        Breast  . Hypertension Father   . Hypertension Sister     Social History   Tobacco Use  . Smoking status: Former Smoker    Packs/day: 0.30    Years: 8.00    Pack years: 2.40    Types: Cigarettes    Quit date: 05/30/2004    Years since  quitting: 15.3  . Smokeless tobacco: Never Used  Vaping Use  . Vaping Use: Never used  Substance Use Topics  . Alcohol use: No    Alcohol/week: 0.0 standard drinks  . Drug use: Never    Home Medications Prior to Admission medications   Medication Sig Start Date End Date Taking? Authorizing Provider  acetaminophen (TYLENOL) 325 MG tablet Take 650 mg by mouth every 6 (six) hours as needed for mild pain or headache.    [provider]  atorvastatin (LIPITOR) 40 MG tablet Take 1 tablet (40 mg total) by mouth daily at 6 PM. 06/15/19   Chambliss, Jeb Levering, MD  busPIRone (BUSPAR) 5 MG tablet Take 1 tablet (5 mg total) by mouth 2 (two) times daily. 07/14/19   [name], MD  colchicine 0.6 MG tablet Take one tablet every 8 hours for 3 doses then one tablet every 12 hours for 4 doses then one daily  07/26/19   [name], MD  cyanocobalamin 1000 MCG tablet Take 1 tablet (1,000 mcg total) by mouth daily. 06/15/19   [name], MD  ferrous sulfate 324 MG TBEC Take 1 tablet (324 mg total) by mouth daily. 06/15/19   [name], MD  furosemide (LASIX) 40 MG tablet Take 1 tablet (40 mg total) by mouth daily. 06/15/19   [name], MD  metoprolol succinate (TOPROL-XL) 50 MG 24 hr tablet Take 1 tablet (50 mg total) by mouth daily. Take with or immediately following a meal. 06/14/19   [name], MD  Multiple Vitamin (MULTIVITAMIN) tablet Take 1 tablet by mouth daily.    [provider]  pantoprazole (PROTONIX) 40 MG tablet Take 1 tablet (40 mg total) by mouth daily. 06/15/19   [name], MD  ramelteon (ROZEREM) 8 MG tablet Take 1 tablet (8 mg total) by mouth at bedtime. 06/07/19   [name], MD    Allergies    Plavix [clopidogrel]  Review of Systems   Review of Systems  Cardiovascular: Positive for chest pain.  All other systems reviewed and are negative.   Physical Exam Updated Vital Signs BP (!) 143/79    Pulse (!) 127   Resp (!) 34   SpO2 96%   Physical Exam Vitals and nursing note reviewed.  Constitutional:      Appearance: She is well-developed.  HENT:     Head: Normocephalic and atraumatic.  Eyes:     Conjunctiva/sclera: Conjunctivae normal.     Pupils: Pupils are equal, round, and reactive to light.  Cardiovascular:     Rate and Rhythm: Regular rhythm. Tachycardia present.     Heart sounds: Normal heart sounds.     Comments: Tachy around 125-130 during exam Pulmonary:     Effort: Pulmonary effort is normal.     Breath sounds: Normal breath sounds.     Comments: Tachypneic, able to answer questions in sentences during exam, respirations seem shallow, O2 sats stable on RA Abdominal:     General: Bowel sounds are normal.     Palpations: Abdomen is soft.  Musculoskeletal:        General: Normal range of motion.     Cervical back: Normal range of motion.     Comments: Trace edema at the ankles  Skin:    General: Skin is warm and dry.  Neurological:     Mental Status: She is alert and oriented to person, place, and time.     ED Results / Procedures / Treatments   Labs (all labs ordered are listed, but only abnormal results are displayed) Labs Reviewed  BASIC METABOLIC PANEL - Abnormal; Notable for the following components:      Result Value   Chloride 112 (*)    CO2 17 (*)    Glucose, Bld 152 (*)    GFR calc non Af Amer 55 (*)    All other components within normal limits  CBC - Abnormal; Notable for the following components:   WBC 13.5 (*)    RBC 3.74 (*)    MCV 100.3 (*)    All other components within normal limits  BRAIN NATRIURETIC PEPTIDE - Abnormal; Notable for the following components:   B Natriuretic Peptide 1,087.1 (*)    All other components within normal limits  TROPONIN I (HIGH SENSITIVITY) - Abnormal; Notable for the following components:   Troponin I (High Sensitivity) 34 (*)    All other components within normal  limits  TROPONIN I (HIGH  SENSITIVITY) - Abnormal; Notable for the following components:   Troponin I (High Sensitivity) 42 (*)    All other components within normal limits  SARS CORONAVIRUS 2 BY RT PCR [facility] ORDER, [place] LAB)  CBC  CREATININE, SERUM    EKG EKG Interpretation  Date/Time:  Monday October 02 2019 01:01:58 EDT Ventricular Rate:  116 PR Interval:    QRS Duration: 130 QT Interval:  359 QTC Calculation: 499 R Axis:   59 Text Interpretation: Sinus or ectopic atrial tachycardia Left bundle branch block No significant change since last tracing Confirmed by Orpah Greek [phone]) on 10/02/2019 1:10:07 AM   Radiology CT Angio Chest PE W and/or Wo Contrast  Result Date: 10/02/2019 CLINICAL DATA:  Shortness of breath. EXAM: CT ANGIOGRAPHY CHEST WITH CONTRAST TECHNIQUE: Multidetector CT imaging of the chest was performed using the standard protocol during bolus administration of intravenous contrast. Multiplanar CT image reconstructions and MIPs were obtained to evaluate the vascular anatomy. CONTRAST:  158mL OMNIPAQUE IOHEXOL 350 MG/ML SOLN COMPARISON:  November 06, 2018. FINDINGS: Cardiovascular: Contrast injection is sufficient to demonstrate satisfactory opacification of the pulmonary arteries to the segmental level. There is no acute pulmonary embolism. The main pulmonary artery and right left pulmonary arteries are slightly dilated. There are coronary artery calcifications. There are mild calcifications of the thoracic aorta. The heart is enlarged. There is some reflux of contrast into the IVC. A mitral valve clip is noted. Mediastinum/Nodes: -- No mediastinal lymphadenopathy. -- No hilar lymphadenopathy. -- No axillary lymphadenopathy. -- No supraclavicular lymphadenopathy. -- Normal thyroid gland where visualized. -  Unremarkable esophagus. Lungs/Pleura: There are small bilateral pleural effusions. There is a slightly mosaic appearance of the lung parenchyma bilaterally.  There is a small 3 mm pulmonary nodule in the right lower lobe (axial series 7, image 81). There is a stable 4 mm pulmonary nodule in the right lower lobe (axial series 7, image 104). There is interlobular septal thickening bilaterally. There is no pneumothorax. Upper Abdomen: Contrast bolus timing is not optimized for evaluation of the abdominal organs. The visualized portions of the organs of the upper abdomen are normal. Musculoskeletal: No chest wall abnormality. No bony spinal canal stenosis. Review of the MIP images confirms the above findings. IMPRESSION: 1. No acute pulmonary embolism. 2. Cardiomegaly with findings suggestive of congestive heart failure including small to moderate-sized bilateral pleural effusions, right greater than left. 3. Dilated pulmonary vasculature which can be seen in patients with elevated pulmonary artery pressures. 4. Small right lower lobe pulmonary nodules measuring up to approximately 4 mm. No follow-up needed if patient is low-risk (and has no known or suspected primary neoplasm). Non-contrast chest CT can be considered in 12 months if patient is high-risk. This recommendation follows the consensus statement: Guidelines for Management of Incidental Pulmonary Nodules Detected on CT Images: From the Fleischner Society 2017; Radiology 2017; 284:228-243. Electronically Signed   By: Constance Holster M.D.   On: 10/02/2019 03:05   DG Chest Portable 1 View  Result Date: 10/02/2019 CLINICAL DATA:  Chest pain EXAM: PORTABLE CHEST 1 VIEW COMPARISON:  05/23/2019 FINDINGS: The heart size and mediastinal contours are within normal limits. Both lungs are clear. The visualized skeletal structures are unremarkable. IMPRESSION: No active disease. Electronically Signed   By: Ulyses Jarred M.D.   On: 10/02/2019 01:42    Procedures Procedures (including critical care time)  Medications Ordered in ED Medications  colchicine tablet 0.6 mg (has no  administration in time range)    atorvastatin (LIPITOR) tablet 40 mg (has no administration in time range)  metoprolol succinate (TOPROL-XL) 24 hr tablet 50 mg (has no administration in time range)  busPIRone (BUSPAR) tablet 5 mg (has no administration in time range)  ramelteon (ROZEREM) tablet 8 mg (has no administration in time range)  pantoprazole (PROTONIX) EC tablet 40 mg (has no administration in time range)  vitamin B-12 (CYANOCOBALAMIN) tablet 1,000 mcg (has no administration in time range)  ferrous sulfate tablet 325 mg (has no administration in time range)  multivitamin with minerals tablet 1 tablet (has no administration in time range)  sodium chloride flush (NS) 0.9 % injection 3 mL (has no administration in time range)  sodium chloride flush (NS) 0.9 % injection 3 mL (has no administration in time range)  0.9 %  sodium chloride infusion (has no administration in time range)  acetaminophen (TYLENOL) tablet 650 mg (has no administration in time range)  ondansetron (ZOFRAN) injection 4 mg (has no administration in time range)  enoxaparin (LOVENOX) injection 40 mg (has no administration in time range)  sodium chloride flush (NS) 0.9 % injection 3 mL (3 mLs Intravenous Given 10/02/19 0355)  iohexol (OMNIPAQUE) 350 MG/ML injection 100 mL (100 mLs Intravenous Contrast Given 10/02/19 0239)  furosemide (LASIX) injection 40 mg (40 mg Intravenous Given 10/02/19 0358)    ED Course  I have reviewed the triage vital signs and the nursing notes.  Pertinent labs & imaging results that were available during my care of the patient were reviewed by me and considered in my medical decision making (see chart for details).    MDM Rules/Calculators/A&P    74 year old female presenting to the ED with chest pain and shortness of breath.  States she has felt unwell for the past few days now.  She denies any significant weight gain or nighttime orthopnea.  She is tachypneic and tachycardic on arrival.  She does report having some  anxiety about being in the hospital.  She does not appear significantly fluid overloaded, trace edema at the ankles.  Lungs are grossly clear and she is in no acute respiratory distress on room air.  Labs pending along with chest x-ray.  EKG without any acute ischemic changes.  Labs as above-- BNP elevated at 1087, initial trop 34, delta is 42 (<20 change).  Suspect this may be demand ischemia. CXR without any findings of vascular congestion/pulmonary edema.  She remains tachycardic into the 120's.  Will obtain CTA to assess for PE or other abnormalities.  3:47 AM PE study is negative for findings of PE, she does have some vascular congestion noted.  Patient's heart rate has improved, now in the low 110's.  Patient does report she has not had any of her home medications in the past 24 hours.  She is supposed to be on Toprol-XL, this may be contributing to her tachycardia along with fluid buildup.  Will give dose of IV Lasix here.  Will consult family practice for admission.  Spoke with family practice intern, Dr. Caron Presume-- will admit for ongoing care.  Final Clinical Impression(s) / ED Diagnoses Final diagnoses:  Acute on chronic congestive heart failure, unspecified heart failure type [facility])  Elevated troponin    Rx / DC Orders ED Discharge Orders    None       Larene Pickett, PA-C 10/02/19 0533    Orpah Greek, MD 10/02/19 [phone]

## 2019-10-02 NOTE — Discharge Summary (Signed)
[facility] Discharge Summary  [name] Medical record number: [id number] Date of birth: [birth year] Age: 74 y.o. Gender: female Date of Admission: 10/02/2019  Date of Discharge: 10/04/2019 Admitting Physician: Concepcion Living, Medical Student  Primary Care Provider: [name], MD Consultants: None  Indication for Hospitalization: Chest discomfort  Discharge Diagnoses/Problem List:  Active Problems:   CHF (congestive heart failure) ([place])  Disposition: Home  Discharge Condition: Stable  Discharge Exam:  General: Alert and oriented in no apparent distress Heart: Regular rate and rhythm with no murmurs appreciated Lungs: CTA bilaterally, no wheezing Abdomen: Bowel sounds present, no abdominal pain Extremities: Trace lower limb edema   Brief Hospital Course:  Patient presented with complaints of chest discomfort.  In the emergency department patient's troponins were trended which trended flat in the mid 40s.  Patient was noted to have some lower limb edema..  Out of concern for pulmonary embolism a CTA was ordered which also showed signs of fluid overload but no PE.  Patient was started on 40 mg IV Lasix twice daily.  Over the course of the hospitalization patient reduce weight by 1.6 pounds with help of IV Lasix and leg edema improved as well as chest pain resolved.  Patient continued to do well with no other complaints and upon day of discharge was reduced to patient's home dose of 40 mg oral Lasix once daily.  Patient was discharged on his home dose of Lasix.  Issues for Follow Up:  1. PCP-patient discharged on home dose of Lasix, consider adjusting if needed  Significant Procedures: None  Significant Labs and Imaging:  Recent Labs  Lab 10/02/19 0048 10/02/19 0621  WBC 13.5* 12.3*  HGB 12.0 12.8  HCT 37.5 39.7  PLT 263 284   Recent Labs  Lab 10/02/19 0048 10/02/19 0048 10/02/19 0621 10/03/19 0751  10/03/19 0751 10/03/19 1545 10/04/19 0639  NA 142  --   --  141  --  141 141  K 3.7   < >  --  3.7   < > 3.7 3.5  CL 112*  --   --  105  --  104 101  CO2 17*  --   --  25  --  25 26  GLUCOSE 152*  --   --  144*  --  134* 120*  BUN 18  --   --  23  --  31* 34*  CREATININE 0.99  --  1.16* 1.46*  --  1.56* 1.49*  CALCIUM 8.9  --   --  8.8*  --  8.8* 8.6*   < > = values in this interval not displayed.     Results/Tests Pending at Time of Discharge: None  Discharge Medications:  Allergies as of 10/04/2019      Reactions   Plavix [clopidogrel] Other (See Comments)   GI bleed from jejunal angiodysplastic lesions in context of Plavix and aspirin dual therapy      Medication List    TAKE these medications   acetaminophen 325 MG tablet Commonly known as: TYLENOL Take 650 mg by mouth every 6 (six) hours as needed for mild pain or headache.   atorvastatin 40 MG tablet Commonly known as: LIPITOR Take 1 tablet (40 mg total) by mouth daily at 6 PM.   busPIRone 5 MG tablet Commonly known as: BUSPAR Take 1 tablet (5 mg total) by mouth 2 (two) times daily.   colchicine 0.6 MG tablet Take one tablet every 8  hours for 3 doses then one tablet every 12 hours for 4 doses then one daily for flares What changed: additional instructions   cyanocobalamin 1000 MCG tablet Take 1 tablet (1,000 mcg total) by mouth daily.   ferrous sulfate 324 MG Tbec Take 1 tablet (324 mg total) by mouth daily.   furosemide 40 MG tablet Commonly known as: LASIX Take 1 tablet (40 mg total) by mouth daily.   metoprolol succinate 50 MG 24 hr tablet Commonly known as: TOPROL-XL Take 1 tablet (50 mg total) by mouth daily. Take with or immediately following a meal.   multivitamin tablet Take 1 tablet by mouth daily.   pantoprazole 40 MG tablet Commonly known as: PROTONIX Take 1 tablet (40 mg total) by mouth daily.   ramelteon 8 MG tablet Commonly known as: ROZEREM Take 1 tablet (8 mg total) by mouth at  bedtime.       Discharge Instructions: Please refer to Patient Instructions section of EMR for full details.  Patient was counseled important signs and symptoms that should prompt return to medical care, changes in medications, dietary instructions, activity restrictions, and follow up appointments.   Follow-Up Appointments:   Lurline Del, DO 10/04/2019, 1:41 PM PGY-1, [place]

## 2019-10-02 NOTE — Progress Notes (Signed)
10/02/19 1758  PT Visit Information  Last PT Received On 10/02/19  Assistance Needed +1 (+2 for chair follow may be helpful )  PT/OT/SLP Co-Evaluation/Treatment Yes  Reason for Co-Treatment Necessary to address cognition/behavior during functional activity;For patient/therapist safety  PT goals addressed during session Mobility/safety with mobility;Balance  History of Present Illness Hannah [name] is a 74 y.o. female presenting with chest pain 2/2 CHF exacerbation. PMH is significant for hx of symptomatic anemia, HFrEF s/p mitral valve repair 01/2019, CKD 3B, HTN, HLD, insomnia, dementia  Precautions  Precautions Fall  Restrictions  Weight Bearing Restrictions No  Home Living  Family/patient expects to be discharged to: Private residence  Living Arrangements Other relatives (daughter)  Available Help at Discharge Family;Available PRN/intermittently  Type of Home House  Home Access Stairs to enter  Entrance Stairs-Number of Steps 3-4  Entrance Stairs-Rails Right;Left;Can reach both  Home Layout One level  Ryder System;Tub only  Quarry manager - 2 wheels;Wheelchair - manual;Shower seat;Grab bars - tub/shower  Prior Function  Level of Independence Independent  Comments Still drives  Communication  Communication No difficulties  Pain Assessment  Pain Assessment No/denies pain  Cognition  Arousal/Alertness Awake/alert  Behavior During Therapy WFL for tasks assessed/performed  Overall Cognitive Status Impaired/Different from baseline  Area of Impairment Orientation;Memory;Safety/judgement  Orientation Level Disoriented to;Place (thought she was at school)  Memory Decreased short-term memory  Safety/Judgement Decreased awareness of safety;Decreased awareness of deficits  General Comments Unplugging herself to get up and go to bathroom at bedside--not always calling for A (even though has been  asked to). Does not know how to plug herself back in . Sitting at end of stretcher upon my arrival to her room. Very talkative. Asked at the end of the session where she was earlier stating "my primary doctor and a friend of his and many other people were wherever I was". Also randomly stating she could not walk anymore after prolonged ambulation   Upper Extremity Assessment  Upper Extremity Assessment Defer to OT evaluation  Lower Extremity Assessment  Lower Extremity Assessment Generalized weakness  Cervical / Trunk Assessment  Cervical / Trunk Assessment Normal  Bed Mobility  Overal bed mobility Needs Assistance  Bed Mobility Sit to Supine  Sit to supine Supervision  General bed mobility comments Supervision for safety.   Transfers  Overall transfer level Needs assistance  Equipment used None  Transfers Sit to/from Stand  Sit to Stand Supervision;+2 safety/equipment  General transfer comment Supervision for safety and line management.   Ambulation/Gait  Ambulation/Gait assistance Min guard;+2 safety/equipment  Gait Distance (Feet) 250 Feet  Assistive device None  Gait Pattern/deviations Step-through pattern;Decreased stride length  General Gait Details Pt very talkative and not following commands to perform higher level balance tasks. After about 250', pt reports she could not ambulate any further (no indication of this prior) and 2nd person had to get [facility] for return to room. Pt reports she "got tired". VSS throughout.   Gait velocity Decreased  Balance  Overall balance assessment Needs assistance  Sitting-balance support No upper extremity supported;Feet supported  Sitting balance-Leahy Scale Good  Standing balance support No upper extremity supported;During functional activity  Standing balance-Leahy Scale Fair  PT - End of Session  Equipment Utilized During Treatment Gait belt  Activity Tolerance Patient limited by fatigue  Patient left in bed;with call bell/phone within reach   Nurse Communication Mobility status  PT Assessment  PT Recommendation/Assessment  Patient needs continued PT services  PT Visit Diagnosis Other abnormalities of gait and mobility (R26.89);Muscle weakness (generalized) (M62.81);Difficulty in walking, not elsewhere classified (R26.2)  PT Problem List Decreased cognition;Decreased mobility;Decreased activity tolerance;Decreased balance;Decreased safety awareness;Decreased knowledge of precautions  Barriers to Discharge Decreased caregiver support  PT Plan  PT Frequency (ACUTE ONLY) Min 3X/week  PT Treatment/Interventions (ACUTE ONLY) Gait training;Stair training;Functional mobility training;Therapeutic exercise;Therapeutic activities;Balance training;Patient/family education;Cognitive remediation  AM-PAC PT "6 Clicks" Mobility Outcome Measure (Version 2)  Help needed turning from your back to your side while in a flat bed without using bedrails? 4  Help needed moving from lying on your back to sitting on the side of a flat bed without using bedrails? 4  Help needed moving to and from a bed to a chair (including a wheelchair)? 3  Help needed standing up from a chair using your arms (e.g., wheelchair or bedside chair)? 3  Help needed to walk in hospital room? 3  Help needed climbing 3-5 steps with a railing?  2  6 Click Score 19  Consider Recommendation of Discharge To: Home with [facility]  PT Recommendation  Follow Up Recommendations Home health PT;Supervision/Assistance - 24 hour  PT equipment None recommended by PT  Individuals Consulted  Consulted and Agree with Results and Recommendations Patient  Acute Rehab PT Goals  Patient Stated Goal to go home  PT Goal Formulation With patient  Time For Goal Achievement 10/16/19  Potential to Achieve Goals Good  PT Time Calculation  PT Start Time (ACUTE ONLY) 1413  PT Stop Time (ACUTE ONLY) 1434  PT Time Calculation (min) (ACUTE ONLY) 21 min  PT General Charges  $$ ACUTE PT VISIT 1 Visit  PT  Evaluation  $PT Eval Moderate Complexity 1 Mod  Written Expression  Dominant Hand Right   Pt admitted secondary to problem above with deficits below. Pt seen with OT in ED for safety as pt had been very restless, with decreased safety awareness and with poor activity tolerance per notes. Pt with cognitive deficits and not following cues to perform dynamic gait tasks. After ~250', pt stopping and stating she could not ambulate any further; OT grabbed WC for pt and had to wheel pt back to room. Feel pt will require 24/7 support at d/c for safety. Will continue to follow acutely to maximize functional mobility independence and safety.   Reuel Derby, PT, DPT  Acute Rehabilitation Services  Pager: [phone] Office: [phone]

## 2019-10-03 DIAGNOSIS — I509 Heart failure, unspecified: Secondary | ICD-10-CM | POA: Diagnosis not present

## 2019-10-03 LAB — BASIC METABOLIC PANEL
Anion gap: 11 (ref 5–15)
Anion gap: 12 (ref 5–15)
BUN: 23 mg/dL (ref 8–23)
BUN: 31 mg/dL — ABNORMAL HIGH (ref 8–23)
CO2: 25 mmol/L (ref 22–32)
CO2: 25 mmol/L (ref 22–32)
Calcium: 8.8 mg/dL — ABNORMAL LOW (ref 8.9–10.3)
Calcium: 8.8 mg/dL — ABNORMAL LOW (ref 8.9–10.3)
Chloride: 105 mmol/L (ref 98–111)
Chloride: 104 mmol/L (ref 98–111)
Creatinine, Ser: 1.46 mg/dL — ABNORMAL HIGH (ref 0.44–1.00)
Creatinine, Ser: 1.56 mg/dL — ABNORMAL HIGH (ref 0.44–1.00)
GFR calc Af Amer: 40 mL/min — ABNORMAL LOW (ref >60)
GFR calc Af Amer: 37 mL/min — ABNORMAL LOW (ref >60)
GFR calc non Af Amer: 35 mL/min — ABNORMAL LOW (ref >60)
GFR calc non Af Amer: 32 mL/min — ABNORMAL LOW (ref >60)
Glucose, Bld: 144 mg/dL — ABNORMAL HIGH (ref 70–99)
Glucose, Bld: 134 mg/dL — ABNORMAL HIGH (ref 70–99)
Potassium: 3.7 mmol/L (ref 3.5–5.1)
Potassium: 3.7 mmol/L (ref 3.5–5.1)
Sodium: 141 mmol/L (ref 135–145)
Sodium: 141 mmol/L (ref 135–145)

## 2019-10-03 MED ORDER — ZOLPIDEM TARTRATE 5 MG PO TABS
5.0000 mg | ORAL_TABLET | Freq: Once | ORAL | Status: AC
  Administered 2019-10-03: 5 mg via ORAL
  Filled 2019-10-03: qty 1

## 2019-10-03 NOTE — TOC Initial Note (Addendum)
Transition of Care [facility]) - Initial/Assessment Note    Patient Details  Name: Hannah [name] MRN: [id number] Date of Birth: 08/27/1945  Transition of Care [facility]) CM/SW Contact:    Trula Ore, [place] Phone Number: 10/03/2019, 4:56 PM  Clinical Narrative:                  CSW spoke with patients daughter Hannah [name] who declined SNF placement for patient. Patients daughter wants patient to go home at time of discharge. CSW will follow up with case manager for any other patient needs at time of discharge.  TOC team will continue to follow.   Expected Discharge Plan: Home/Self Care Barriers to Discharge: Continued Medical Work up   Patient Goals and CMS Choice   CMS Medicare.gov Compare Post Acute Care list provided to:: Patient Represenative (must comment) (daughter Hannah [name]) Choice offered to / list presented to : Adult Children Hannah [name])  Expected Discharge Plan and Services Expected Discharge Plan: Home/Self Care       Living arrangements for the past 2 months: [place]                                      Prior Living Arrangements/Services Living arrangements for the past 2 months: Single Family Home   Patient language and need for interpreter reviewed:: Yes Do you feel safe going back to the place where you live?: Yes (per daughter Hannah [name])      Need for Family Participation in Patient Care: Yes (Comment) Care giver support system in place?: Yes (comment)   Criminal Activity/Legal Involvement Pertinent to Current Situation/Hospitalization: No - Comment as needed  Activities of Daily Living      Permission Sought/Granted Permission sought to share information with : Case Manager, Family Supports, Customer service manager                Emotional Assessment       Orientation: : Oriented to Self, Oriented to Place, Oriented to  Time, Oriented to Situation (Memory impairment) Alcohol / Substance Use: Not Applicable Psych  Involvement: No (comment)  Admission diagnosis:  CHF (congestive heart failure) ([place]) [I50.9] Elevated troponin [R77.8] Acute on chronic congestive heart failure, unspecified heart failure type [facility]) [I50.9] Patient Active Problem List   Diagnosis Date Noted  . CHF (congestive heart failure) ([place]) 10/02/2019  . Anemia due to acute blood loss 03/09/2019  . HLD (hyperlipidemia)   . Adverse effect of antiplatelet agent 02/22/2019  . S/P mitral valve repair   . CKD (chronic kidney disease) stage 3, GFR 30-59 ml/min   . Vitamin B12 deficiency 12/28/2018  . Chronic combined systolic and diastolic congestive heart failure ([place])   . AKI (acute kidney injury) ([place])   . Dementia due to atherosclerosis with behavioral disturbance ([place]) 02/02/2018  . Gout 09/11/2015  . Essential hypertension 08/09/2008  . GERD 08/09/2008   PCP:  [name], MD Pharmacy:   [facility] [place], [place] [place] [address] Phone: [phone] Fax: [phone]     Social Determinants of Health (SDOH) Interventions    Readmission Risk Interventions Readmission Risk Prevention Plan 02/03/2019  Transportation Screening Complete  Medication Review (RN Care Manager) Complete  PCP or Specialist appointment within 3-5 days of discharge Complete  HRI or [place] Complete  SW  Recovery Care/Counseling Consult Complete  Palliative Care Screening Not Applicable  Skilled Nursing Facility Not Applicable  Some recent data might be hidden

## 2019-10-03 NOTE — Progress Notes (Addendum)
Family Medicine Teaching Service Daily Progress Note Intern Pager: [phone]  [name] Medical record number: [id number] Date of birth: April 03, 1946 Age: 74 y.o. Gender: female  Primary Care Provider: [name], MD Consultants: None Code Status: Full  Pt Overview and Major Events to Date:  6/14-admitted  Assessment and Plan: Hannah [name] is a 74 y.o. female presenting with chest pain 2/2 CHF exacerbation. PMH is significant for hx of symptomatic anemia, HFrEF s/p mitral valve repair 01/2019, CKD 3B, HTN, HLD, insomnia, dementia.  Chest pain secondary to treat CHF exacerbation. Upon admission troponins were trended which remained flat in the mid 40s.  CTA negative for PE but did show cardiomegaly suggestive of CHF, dilated pulmonary vasculature.  Continue on IV Lasix 80 mg twice daily.  Patient down 0.6 pounds as admission. -Continuous pulse ox -Continuous cardiac monitoring -Continue Lasix 80 mg IV twice daily -Strict I's and O's  HFrEF s/p mitral valve repair 01/2019 Most recent echo was 03/02/2019 and showed an EF of 30-35% with normal functioning mitral clip.  Patient is followed by [facility].  Home meds include metoprolol 25 mg daily, Lasix 60 mg daily.  Per patient and her daughter she does not take Lasix daily.  Per chart review she is not on ACE or an ARB due to hypotensive episodes.  Patient was on DAPT with aspirin and Plavix but the Plavix was discontinued due to GI bleed.  Weight on admission has not yet been obtained.  Last weight recorded was 75.2 kg approximately 3 months ago.  BNP on admission was 1087.1.  Troponins trending 34>42>44. Chest pain resolved. -Continue home metoprolol 25 mg daily  CKD 3B Creatinine on arrival was 0.99.  Baseline is 1.3-1.4.  Currently 1.16. -Monitor with daily BMP -Avoid nephrotoxic agents  Hypertension Blood pressure most recently 123/71.  Home medications include losartan,  metoprolol, Lasix. -Continue home losartan -Continue home metoprolol  -Lasix 40 IV twice daily  Hyperlipidemia Home medications include atorvastatin 40 mg.  Most recent lipid panel was 12/09/2018 and showed cholesterol 127, HDL 55, LDL 63, triglycerides 44. -Continue home medications  Insomnia Home medications include trazodone -Holding trazodone while inpatient -May add ramelteon or melatonin if needed  Dementia Patient with significant history of dementia and is a poor historian at baseline. -We will contact daughter as needed  - consider low dose benzo if becomes agitated  Hx of Symptomatic anemia Patient with multiple hospitalizations over the last year for symptomatic anemia secondary to GI bleed.  She was on aspirin and Plavix for mitral valve repair but Plavix is has been DC'd due to GI bleed.  Hemoglobin on admission was 12.0.  No signs or symptoms of blood loss at this time. -Monitor for signs or symptoms of anemia  Incidental Pulmonary Nodule: CTA notable for a small right lower lobe pulmonary nodule measuring up to approximately 4 mm.  No follow-up needed if patient is low risk.  Noncontrast chest CT can be considered in 12 months if patient is high risk.   - recommend outpatient follow up if indicated  FEN/GI: Heart healthy diet PPx: Lovenox  Disposition: Pending diuresis, likely 24-48 additional hours.  Subjective:  Patient without complaints this morning, eating breakfast upon my interview with her.  States she has been urinating well but that her legs still seem more swollen than usual.  Objective: Temp:  [97.5 F (36.4 C)-99.3 F (37.4 C)] 98.1 F (36.7 C) (06/15 0434) Pulse Rate:  [41-125] 85 (  06/15 0434) Resp:  [18-34] 19 (06/15 0434) BP: (104-142)/(59-86) 104/63 (06/15 0434) SpO2:  [93 %-99 %] 95 % (06/15 0434) Weight:  [76.6 kg-76.8 kg] 76.6 kg (06/15 0434) Physical Exam: General: Alert and oriented in no apparent distress Heart: S1, S2 with no  murmurs appreciated Lungs: CTA bilaterally Skin: Warm and dry Extremities: 1+ pitting edema bilaterally in lower extremities   Laboratory: Recent Labs  Lab 10/02/19 0048 10/02/19 0621  WBC 13.5* 12.3*  HGB 12.0 12.8  HCT 37.5 39.7  PLT 263 284   Recent Labs  Lab 10/02/19 0048 10/02/19 0621  NA 142  --   K 3.7  --   CL 112*  --   CO2 17*  --   BUN 18  --   CREATININE 0.99 1.16*  CALCIUM 8.9  --   GLUCOSE 152*  --      Imaging/Diagnostic Tests: CT Angio Chest PE W and/or Wo Contrast  Result Date: 10/02/2019 CLINICAL DATA:  Shortness of breath. EXAM: CT ANGIOGRAPHY CHEST WITH CONTRAST TECHNIQUE: Multidetector CT imaging of the chest was performed using the standard protocol during bolus administration of intravenous contrast. Multiplanar CT image reconstructions and MIPs were obtained to evaluate the vascular anatomy. CONTRAST:  135mL OMNIPAQUE IOHEXOL 350 MG/ML SOLN COMPARISON:  November 06, 2018. FINDINGS: Cardiovascular: Contrast injection is sufficient to demonstrate satisfactory opacification of the pulmonary arteries to the segmental level. There is no acute pulmonary embolism. The main pulmonary artery and right left pulmonary arteries are slightly dilated. There are coronary artery calcifications. There are mild calcifications of the thoracic aorta. The heart is enlarged. There is some reflux of contrast into the IVC. A mitral valve clip is noted. Mediastinum/Nodes: -- No mediastinal lymphadenopathy. -- No hilar lymphadenopathy. -- No axillary lymphadenopathy. -- No supraclavicular lymphadenopathy. -- Normal thyroid gland where visualized. -  Unremarkable esophagus. Lungs/Pleura: There are small bilateral pleural effusions. There is a slightly mosaic appearance of the lung parenchyma bilaterally. There is a small 3 mm pulmonary nodule in the right lower lobe (axial series 7, image 81). There is a stable 4 mm pulmonary nodule in the right lower lobe (axial series 7, image 104).  There is interlobular septal thickening bilaterally. There is no pneumothorax. Upper Abdomen: Contrast bolus timing is not optimized for evaluation of the abdominal organs. The visualized portions of the organs of the upper abdomen are normal. Musculoskeletal: No chest wall abnormality. No bony spinal canal stenosis. Review of the MIP images confirms the above findings. IMPRESSION: 1. No acute pulmonary embolism. 2. Cardiomegaly with findings suggestive of congestive heart failure including small to moderate-sized bilateral pleural effusions, right greater than left. 3. Dilated pulmonary vasculature which can be seen in patients with elevated pulmonary artery pressures. 4. Small right lower lobe pulmonary nodules measuring up to approximately 4 mm. No follow-up needed if patient is low-risk (and has no known or suspected primary neoplasm). Non-contrast chest CT can be considered in 12 months if patient is high-risk. This recommendation follows the consensus statement: Guidelines for Management of Incidental Pulmonary Nodules Detected on CT Images: From the Fleischner Society 2017; Radiology 2017; 284:228-243. Electronically Signed   By: Constance Holster M.D.   On: 10/02/2019 03:05   DG Chest Portable 1 View  Result Date: 10/02/2019 CLINICAL DATA:  Chest pain EXAM: PORTABLE CHEST 1 VIEW COMPARISON:  05/23/2019 FINDINGS: The heart size and mediastinal contours are within normal limits. Both lungs are clear. The visualized skeletal structures are unremarkable. IMPRESSION: No active disease. Electronically Signed  By: Ulyses Jarred M.D.   On: 10/02/2019 01:42    Lurline Del, DO 10/03/2019, 6:14 AM PGY-1, [place] Intern pager: [phone], text pages welcome

## 2019-10-03 NOTE — Progress Notes (Signed)
Pt. Confused and attempting to leave the hospital. "Trying to get to my car so I can get home". Alert and oriented to self only. Unaware of place, situation, time or date. On call for FMTS paged to make aware.

## 2019-10-03 NOTE — Plan of Care (Signed)
  Problem: Activity: Goal: Risk for activity intolerance will decrease Outcome: Progressing   Problem: Elimination: Goal: Will not experience complications related to bowel motility Outcome: Progressing   

## 2019-10-03 NOTE — Progress Notes (Signed)
Physical Therapy Treatment Patient Details Name: Hannah [name] MRN: [id number] DOB: [birth year] Today's Date: 10/03/2019    History of Present Illness Hannah [name] is a 74 y.o. female presenting with chest pain 2/2 CHF exacerbation. PMH is significant for hx of symptomatic anemia, HFrEF s/p mitral valve repair 01/2019, CKD 3B, HTN, HLD, insomnia, dementia    PT Comments    Pt progressing towards goals. Continues to exhibit cognitive deficits, as pt was up in room in regular socks by herself and had taken off her telemetry monitor. Pt also unable to remember room number and thought she was at work. Pt with increased instability when performing dynamic gait tasks, requiring up to min A. Per MD, pt's family was initially wanting pt to go SNF over concerns of adequate support, however, now family currently refusing. Will require 24/7 support at d/c to ensure safety. Will continue to follow acutely to maximize functional mobility independence and safety.     Follow Up Recommendations  Home health PT;Supervision/Assistance - 24 hour     Equipment Recommendations  None recommended by PT    Recommendations for Other Services       Precautions / Restrictions Precautions Precautions: Fall Restrictions Weight Bearing Restrictions: No    Mobility  Bed Mobility               General bed mobility comments: standing in room upon entry.   Transfers Overall transfer level: Needs assistance Equipment used: None Transfers: Sit to/from Stand Sit to Stand: Supervision         General transfer comment: Supervision for safety.   Ambulation/Gait Ambulation/Gait assistance: Min assist;Min guard Gait Distance (Feet): 200 Feet Assistive device: None Gait Pattern/deviations: Step-through pattern;Decreased stride length;Drifts right/left Gait velocity: Decreased   General Gait Details: Pt with unsteadiness with dynamic gait tasks (horizontal/vertical head turns and stepping over  objects); required min A for steadying. Pt easily distracted during gait. Was unable to remember her room number.    Stairs             Wheelchair Mobility    Modified Rankin (Stroke Patients Only)       Balance Overall balance assessment: Needs assistance Sitting-balance support: No upper extremity supported;Feet supported Sitting balance-Leahy Scale: Good     Standing balance support: No upper extremity supported;During functional activity Standing balance-Leahy Scale: Fair                              Cognition Arousal/Alertness: Awake/alert Behavior During Therapy: WFL for tasks assessed/performed Overall Cognitive Status: Impaired/Different from baseline Area of Impairment: Orientation;Memory;Safety/judgement                 Orientation Level: Disoriented to;Place;Situation   Memory: Decreased short-term memory   Safety/Judgement: Decreased awareness of safety;Decreased awareness of deficits     General Comments: Thought she was at work. Was up in room and had removed tele monitor leads walking around in normal socks. Did not have gripper socks on. Pt could not remember room number and required assist navigating back to room. Could not remember that the doctor had been by to see her. Was able to recall 2/3 words for recall task.       Exercises      General Comments General comments (skin integrity, edema, etc.): Per MD, pt does not have adequate family support       Pertinent Vitals/Pain Pain Assessment: No/denies pain    Home Living  Prior Function            PT Goals (current goals can now be found in the care plan section) Acute Rehab PT Goals Patient Stated Goal: to go home PT Goal Formulation: With patient Time For Goal Achievement: 10/16/19 Potential to Achieve Goals: Good Progress towards PT goals: Progressing toward goals    Frequency    Min 3X/week      PT Plan Current plan  remains appropriate    Co-evaluation              AM-PAC PT "6 Clicks" Mobility   Outcome Measure  Help needed turning from your back to your side while in a flat bed without using bedrails?: None Help needed moving from lying on your back to sitting on the side of a flat bed without using bedrails?: None Help needed moving to and from a bed to a chair (including a wheelchair)?: A Little Help needed standing up from a chair using your arms (e.g., wheelchair or bedside chair)?: None Help needed to walk in hospital room?: A Little Help needed climbing 3-5 steps with a railing? : A Lot 6 Click Score: 20    End of Session Equipment Utilized During Treatment: Gait belt Activity Tolerance: Patient limited by fatigue Patient left: in bed;with call bell/phone within reach Nurse Communication: Mobility status PT Visit Diagnosis: Other abnormalities of gait and mobility (R26.89);Muscle weakness (generalized) (M62.81);Difficulty in walking, not elsewhere classified (R26.2)     Time: 1660-6301 PT Time Calculation (min) (ACUTE ONLY): 18 min  Charges:  $Gait Training: 8-22 mins                     Reuel Derby, PT, DPT  Acute Rehabilitation Services  Pager: [phone] Office: [phone]    Rudean Hitt 10/03/2019, 5:02 PM

## 2019-10-04 DIAGNOSIS — I509 Heart failure, unspecified: Secondary | ICD-10-CM | POA: Diagnosis not present

## 2019-10-04 LAB — BASIC METABOLIC PANEL
Anion gap: 14 (ref 5–15)
BUN: 34 mg/dL — ABNORMAL HIGH (ref 8–23)
CO2: 26 mmol/L (ref 22–32)
Calcium: 8.6 mg/dL — ABNORMAL LOW (ref 8.9–10.3)
Chloride: 101 mmol/L (ref 98–111)
Creatinine, Ser: 1.49 mg/dL — ABNORMAL HIGH (ref 0.44–1.00)
GFR calc Af Amer: 39 mL/min — ABNORMAL LOW (ref >60)
GFR calc non Af Amer: 34 mL/min — ABNORMAL LOW (ref >60)
Glucose, Bld: 120 mg/dL — ABNORMAL HIGH (ref 70–99)
Potassium: 3.5 mmol/L (ref 3.5–5.1)
Sodium: 141 mmol/L (ref 135–145)

## 2019-10-04 MED ORDER — COLCHICINE 0.6 MG PO TABS
ORAL_TABLET | ORAL | 0 refills | Status: DC
Start: 2019-10-04 — End: 2019-10-10

## 2019-10-04 MED ORDER — FUROSEMIDE 40 MG PO TABS
40.0000 mg | ORAL_TABLET | Freq: Every day | ORAL | Status: DC
  Administered 2019-10-04: 40 mg via ORAL
  Filled 2019-10-04: qty 1

## 2019-10-04 NOTE — Progress Notes (Signed)
Occupational Therapy Treatment Patient Details Name: Hannah [name] MRN: [id number] DOB: 1945-07-25 Today's Date: 10/04/2019    History of present illness Hannah [name] is a 74 y.o. female presenting with chest pain 2/2 CHF exacerbation. PMH is significant for hx of symptomatic anemia, HFrEF s/p mitral valve repair 01/2019, CKD 3B, HTN, HLD, insomnia, dementia   OT comments  Pt presents seated in recliner pleasant and willing to participate in therapy session. Pt completing room level mobility tasks without AD, toileting and standing grooming ADL at minguard assist level throughout. Pt continues to present with impaired cognition included poor attention, decreased STM and decreased safety awareness which places her at an increased risk for falls - often requiring cues to ensure safety during ADL/mobility tasks today. Continue to recommend [place] services at time of discharge as well as recommend she have hands on 24hr supervision/assist to ensure safety with ADL/mobility tasks and to reduce risk for falls. Will continue to follow while acutely admitted.   Follow Up Recommendations  Home health OT;Supervision/Assistance - 24 hour    Equipment Recommendations  None recommended by OT          Precautions / Restrictions Precautions Precautions: Fall Restrictions Weight Bearing Restrictions: No       Mobility Bed Mobility               General bed mobility comments: OOB in recliner upon arrival  Transfers Overall transfer level: Needs assistance Equipment used: None Transfers: Sit to/from Stand Sit to Stand: Supervision         General transfer comment: Supervision for safety.     Balance Overall balance assessment: Needs assistance Sitting-balance support: No upper extremity supported;Feet supported Sitting balance-Leahy Scale: Good     Standing balance support: No upper extremity supported;During functional activity Standing balance-Leahy Scale: Fair                              ADL either performed or assessed with clinical judgement   ADL Overall ADL's : Needs assistance/impaired     Grooming: Wash/dry face;Wash/dry hands;Brushing hair;Min guard;Standing Grooming Details (indicate cue type and reason): standing at sink in room                 Toilet Transfer: Min guard;Minimal assistance;Ambulation;Regular Toilet   Toileting- Water quality scientist and Hygiene: Supervision/safety;Sitting/lateral lean;Sit to/from stand Toileting - Water quality scientist Details (indicate cue type and reason): pt performing clothing management (gown and pants/underwear) and pericare after voiding bladder      Functional mobility during ADLs: Min guard;Minimal assistance                         Cognition Arousal/Alertness: Awake/alert Behavior During Therapy: WFL for tasks assessed/performed Overall Cognitive Status: Impaired/Different from baseline Area of Impairment: Orientation;Memory;Safety/judgement;Awareness                 Orientation Level: Disoriented to;Place;Situation;Time   Memory: Decreased short-term memory   Safety/Judgement: Decreased awareness of safety;Decreased awareness of deficits Awareness: Emergent   General Comments: pt easily distracted/tangential, decreased safety awareness which puts her at a higher risk for falls, requires frequent cues for safety and for [facility] recall         Exercises     Shoulder Instructions       General Comments      Pertinent Vitals/ Pain       Pain Assessment: No/denies pain  Home Living  Prior Functioning/Environment              Frequency  Min 2X/week        Progress Toward Goals  OT Goals(current goals can now be found in the care plan section)  Progress towards OT goals: Progressing toward goals  Acute Rehab OT Goals Patient Stated Goal: to go home OT Goal Formulation: With  patient Time For Goal Achievement: 10/16/19 Potential to Achieve Goals: Good ADL Goals Pt Will Perform Grooming: Independently;standing Pt Will Perform Upper Body Bathing: Independently;sitting;standing Pt Will Perform Lower Body Bathing: Independently;sit to/from stand Pt Will Perform Upper Body Dressing: Independently;sitting;standing Pt Will Perform Lower Body Dressing: Independently;sit to/from stand Pt Will Transfer to Toilet: Independently;ambulating;regular height toilet Pt Will Perform Toileting - Clothing Manipulation and hygiene: Independently;sit to/from stand Additional ADL Goal #1: Pt will be oriented x4 without cues needed  Plan Discharge plan remains appropriate    Co-evaluation                 AM-PAC OT "6 Clicks" Daily Activity     Outcome Measure   Help from another person eating meals?: None Help from another person taking care of personal grooming?: A Little Help from another person toileting, which includes using toliet, bedpan, or urinal?: A Little Help from another person bathing (including washing, rinsing, drying)?: A Little Help from another person to put on and taking off regular upper body clothing?: A Little Help from another person to put on and taking off regular lower body clothing?: A Little 6 Click Score: 19    End of Session Equipment Utilized During Treatment: Gait belt  OT Visit Diagnosis: Unsteadiness on feet (R26.81);Muscle weakness (generalized) (M62.81)   Activity Tolerance Patient tolerated treatment well   Patient Left in chair;with call bell/phone within reach;with chair alarm set   Nurse Communication Mobility status        Time: 1740-8144 OT Time Calculation (min): 14 min  Charges: OT General Charges $OT Visit: 1 Visit OT Treatments $Self Care/Home Management : 8-22 mins  Lou Cal, [place] Pager [phone] Office [phone]    Raymondo Band 10/04/2019, 4:37  PM

## 2019-10-04 NOTE — Progress Notes (Signed)
MD informed of patient's daughter report she has covid and looking for place for her Mom to go until she recooperates from [place] she is to call her brother who lives in [place] see if she can take care of their MOM. She is to call me back.

## 2019-10-04 NOTE — TOC Transition Note (Addendum)
Transition of Care [facility]) - CM/SW Discharge Note   Patient Details  Name: TOMOKO SANDRA MRN: [id number] Date of Birth: 1946/03/19  Transition of Care [facility]) CM/SW Contact:  Carles Collet, RN Phone Number: 10/04/2019, 3:02 PM   Clinical Narrative:    Damaris Schooner w patient at bedside. She would like to DC to home w [facility] PT OT services. She does not have a preference for provider. [facility], [facility], [facility], [facility]  declined.  Patient states that she has all needed DME at home.  Daughter who lives with patient tested positive for COVID today, is symptomatic, and not feeling well.   Daughter is speaking with siblings to see which one the patient could stay with.   Daughter [name] called back and her sister will come get patient and keep her at her house in [place].     Final next level of care: Welcome Barriers to Discharge: Family Issues, No [place] will accept this patient   Patient Goals and CMS Choice Patient states their goals for this hospitalization and ongoing recovery are:: to go home CMS Medicare.gov Compare Post Acute Care list provided to:: Patient Choice offered to / list presented to : Patient  Discharge Placement                       Discharge Plan and Services                                     Social Determinants of Health (SDOH) Interventions     Readmission Risk Interventions Readmission Risk Prevention Plan 02/03/2019  Transportation Screening Complete  Medication Review Press photographer) Complete  PCP or Specialist appointment within 3-5 days of discharge Complete  HRI or [place] Complete  SW Recovery Care/Counseling Consult Complete  [place] Not Applicable  Some recent data might be hidden

## 2019-10-04 NOTE — Plan of Care (Signed)

## 2019-10-04 NOTE — Progress Notes (Signed)
Attempted call patient's daughter with discharge instructions she reported just got diagnosed with Covid would have to call her brother see if he could care for their mom while she is sick to call me back. Will alert case management and MD.

## 2019-10-04 NOTE — Progress Notes (Signed)
Daughter called back said Hannah [name] her sister agreed to come pick patient up and take her home with her until she is better. Reported Lattie Haw lived in [place] would take her a little bit to get here reported that was fine would work with her. No further changes noted.

## 2019-10-09 ENCOUNTER — Other Ambulatory Visit: Payer: Self-pay | Admitting: Family Medicine

## 2019-10-10 ENCOUNTER — Other Ambulatory Visit: Payer: Self-pay

## 2019-10-10 DIAGNOSIS — I5042 Chronic combined systolic (congestive) and diastolic (congestive) heart failure: Secondary | ICD-10-CM

## 2019-10-10 DIAGNOSIS — E538 Deficiency of other specified B group vitamins: Secondary | ICD-10-CM

## 2019-10-10 MED ORDER — CYANOCOBALAMIN 1000 MCG PO TABS: 1000.0000 ug | ORAL_TABLET | Freq: Every day | ORAL | 0 refills | Status: DC

## 2019-10-10 MED ORDER — FUROSEMIDE 40 MG PO TABS: 40.0000 mg | ORAL_TABLET | Freq: Every day | ORAL | 1 refills | Status: DC

## 2019-10-11 ENCOUNTER — Ambulatory Visit: Payer: Medicare Other | Admitting: Family Medicine

## 2019-10-18 ENCOUNTER — Other Ambulatory Visit: Payer: Self-pay

## 2019-10-18 ENCOUNTER — Ambulatory Visit (INDEPENDENT_AMBULATORY_CARE_PROVIDER_SITE_OTHER): Payer: Medicare Other | Admitting: Family Medicine

## 2019-10-18 ENCOUNTER — Encounter: Payer: Self-pay | Admitting: Family Medicine

## 2019-10-18 DIAGNOSIS — F0151 Vascular dementia with behavioral disturbance: Secondary | ICD-10-CM

## 2019-10-18 DIAGNOSIS — I672 Cerebral atherosclerosis: Secondary | ICD-10-CM

## 2019-10-18 DIAGNOSIS — I509 Heart failure, unspecified: Secondary | ICD-10-CM | POA: Diagnosis not present

## 2019-10-18 DIAGNOSIS — D62 Acute posthemorrhagic anemia: Secondary | ICD-10-CM

## 2019-10-18 DIAGNOSIS — F01518 Vascular dementia, unspecified severity, with other behavioral disturbance: Secondary | ICD-10-CM

## 2019-10-18 NOTE — Patient Instructions (Addendum)
Good to see you today!  Thanks for coming in.  I will call you if your tests are not good.  Otherwise I will send you a letter.  If you do not hear from me with in 2 weeks please call our office.     Keep taking all the medications as you are  If needed can take 2 buspar tablets at night for sleep  Come back in one month for a check up  COVID-19 Vaccine Information can be found at: ShippingScam.co.uk For questions related to vaccine distribution or appointments, please email vaccine@[email or web address] or call [phone].

## 2019-10-18 NOTE — Assessment & Plan Note (Signed)
No signs of fluid overload.  Continue lasix.  Check BMET

## 2019-10-18 NOTE — Assessment & Plan Note (Signed)
Stable with as needed buspar for mood control

## 2019-10-18 NOTE — Progress Notes (Signed)
    SUBJECTIVE:   CHIEF COMPLAINT / HPI:   EDEMA Taking lasix daily.  No lightheadness or shortness of breath or chest pain.  Edema is mild at end of day  DEMENT[place] Living with daughter. Calm most of the time.  Occassions of anxiety or insomnia that respond to buspar as needed   BLEEDING No lightheadness or chest pain or noticing any melena.  Taking iron.  Not taking aspirin or plavix  PERTINENT  PMH / PSH: Visiting [place] next week to see son   OBJECTIVE:   BP 110/62   Pulse 82   Wt 166 lb 3.2 oz (75.4 kg)   SpO2 96%   BMI 30.40 kg/m   Good spirits No pedal edema Moving around room without problems L - clear H -  RRR  ASSESSMENT/PLAN:   CHF (congestive heart failure) (HCC) No signs of fluid overload.  Continue lasix.  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  If does not have evidence of bleeding over the next few months will consider restarting aspirin for vascular prophylaxis      [name], MD [place]

## 2019-10-18 NOTE — Assessment & Plan Note (Signed)
Stable.  If does not have evidence of bleeding over the next few months will consider restarting aspirin for vascular prophylaxis

## 2019-10-19 LAB — BASIC METABOLIC PANEL
BUN/Creatinine Ratio: 23 (ref 12–28)
BUN: 34 mg/dL — ABNORMAL HIGH (ref 8–27)
CO2: 26 mmol/L (ref 20–29)
Calcium: 9.1 mg/dL (ref 8.7–10.3)
Chloride: 99 mmol/L (ref 96–106)
Creatinine, Ser: 1.47 mg/dL — ABNORMAL HIGH (ref 0.57–1.00)
GFR calc Af Amer: 40 mL/min/{1.73_m2} — ABNORMAL LOW (ref >59)
GFR calc non Af Amer: 34 mL/min/{1.73_m2} — ABNORMAL LOW (ref >59)
Glucose: 124 mg/dL — ABNORMAL HIGH (ref 65–99)
Potassium: 4.2 mmol/L (ref 3.5–5.2)
Sodium: 143 mmol/L (ref 134–144)

## 2019-12-18 ENCOUNTER — Other Ambulatory Visit: Payer: Self-pay | Admitting: Family Medicine

## 2019-12-18 DIAGNOSIS — E785 Hyperlipidemia, unspecified: Secondary | ICD-10-CM

## 2019-12-18 DIAGNOSIS — K922 Gastrointestinal hemorrhage, unspecified: Secondary | ICD-10-CM

## 2020-01-07 ENCOUNTER — Other Ambulatory Visit: Payer: Self-pay | Admitting: Family Medicine

## 2020-01-23 NOTE — Progress Notes (Signed)
[facility] [place]                                       Cardiology Office Note    Date:  01/24/2020   ID:  [name], DOB 10-15-1945, MRN [id number]  PCP:  [name], MD  Cardiologist: Dr. Johnsie Cancel  CC: 1 year s/p MitraClip  History of Present Illness:  Hannah [name] is a 74 y.o. female with a history of chronic diastolic CHF, HTN, dementia, GI bleeding s/p mesenteric artery coil embolization,anxiety and severe mitral regurgitation s/p MitraClip (02/02/19) who presents to clinic for follow up.   She presented with progressive heart failure with recurrent admissions last year and diagnosed with severe MR.Diagnostic cardiac catheterization was notable for the absence of significant coronary artery disease and moderate pulmonary hypertension.   She was evaluated by the multidisciplinary valve team and underwent successful transcatheter edge-to-edge mitral valve repair with a single MitraClip NTR placed A2/P2, reducing MR from severe at baseline to 1-2+, LA V wave reduced by 50% from baseline. Post op echo showed EF 30-35%, with mild residual MR and moderate MS (transmitral gradient 7 mm HG). Given reduction in EF, her lopressor was changed to Toprol Xl 25mg  daily and Losartan 25mg  daily was added. She was discharged on aspirin and plavix.   Unfortunately, she was readmitted from 11/3-11/10/20 for symptomatic anemia 2/2 GI bleed. Hg down to 5.7. She was transfused. FOBT positive. Enteroscopy was performed on 11/6 showing normal esophagus, normal stomach, normal duodenum; however, multiple angiodysplastic lesions with stigmata of recent bleeding were found in the proximal jejunum. Coagulation was successful.  She was continued on Protonix 40mg  daily. She had AKI and losartan discontinued and lasix decreased to 40mg  daily. Plavix was discontinued and she was discharged to [facility]. 1 month echo showed EF 30-35%, normally  functioning MitraClip with mild mod resudual MR and mild MS with a mean gradient 5 mm HG.   She was readmitted in 05/2019 with recurrent GI bleed. Aspirin was discontinued indefinitely. Then readmitted in 09/2019 for acute CHF and diuresed well with IV lasix and discharged on home dose of lasix.    Today she presents to clinic for follow up. Here with her daughter. Doing great. No CP or SOB. No LE edema, orthopnea or PND. No dizziness or syncope. No blood in stool or urine. No palpitations. She stays very active playing with her grandchildren. Also, her daughter who has drug addiction issues is not staying with her anymore and that has helped tremendously   Past Medical History:  Diagnosis Date  . Anemia   . Anemia of chronic disease   . Anxiety   . Arthritis    "hands" (05/09/2015)  . Chronic diastolic heart failure ([place])   . CKD (chronic kidney disease) stage 3, GFR 30-59 ml/min (HCC)   . Dementia ([place])   . Elevated TSH 02/03/2018  . GERD (gastroesophageal reflux disease)   . HLD (hyperlipidemia)   . Hypertension   . LBBB (left bundle branch block)   . Mitral valve insufficiency   . Nonrheumatic mitral valve regurgitation 02/02/2019  . Osteoarthritis   . 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 S/P mitral valve repair    s/p MitraClip on 02/03/19  . Vitamin B12 deficiency 12/28/2018    Past Surgical History:  Procedure Laterality Date  . BIOPSY  10/28/2018  Procedure: BIOPSY;  Surgeon: Juanita Craver, MD;  Location: [facility] ENDOSCOPY;  Service: Endoscopy;;  . CARDIAC CATHETERIZATION  05/2004  . COLONOSCOPY  07/2002   Archie Endo 09/02/2010  . COLONOSCOPY WITH PROPOFOL N/A 10/28/2018   Procedure: COLONOSCOPY WITH PROPOFOL;  Surgeon: Juanita Craver, MD;  Location: [facility] ENDOSCOPY;  Service: Endoscopy;  Laterality: N/A;  . ENTEROSCOPY N/A 02/24/2019   Procedure: ENTEROSCOPY;  Surgeon: Carol Ada, MD;  Location: [place];  Service: Endoscopy;  Laterality: N/A;  . ENTEROSCOPY N/A 05/23/2019   Procedure:  ENTEROSCOPY;  Surgeon: Carol Ada, MD;  Location: [facility] ENDOSCOPY;  Service: Endoscopy;  Laterality: N/A;  . ESOPHAGOGASTRODUODENOSCOPY  07/2002   w/biopsies/notes 09/02/2010  . ESOPHAGOGASTRODUODENOSCOPY (EGD) WITH PROPOFOL N/A 10/28/2018   Procedure: ESOPHAGOGASTRODUODENOSCOPY (EGD) WITH PROPOFOL;  Surgeon: Juanita Craver, MD;  Location: [place] ENDOSCOPY;  Service: Endoscopy;  Laterality: N/A;  . GIVENS CAPSULE STUDY N/A 02/23/2019   Procedure: GIVENS CAPSULE STUDY;  Surgeon: Carol Ada, MD;  Location: [place];  Service: Endoscopy;  Laterality: N/A;  . HOT HEMOSTASIS N/A 10/28/2018   Procedure: HOT HEMOSTASIS (ARGON PLASMA COAGULATION/BICAP);  Surgeon: Juanita Craver, MD;  Location: [facility] ENDOSCOPY;  Service: Endoscopy;  Laterality: N/A;  . HOT HEMOSTASIS N/A 02/24/2019   Procedure: HOT HEMOSTASIS (ARGON PLASMA COAGULATION/BICAP);  Surgeon: Carol Ada, MD;  Location: [place];  Service: Endoscopy;  Laterality: N/A;  . HOT HEMOSTASIS N/A 05/23/2019   Procedure: HOT HEMOSTASIS (ARGON PLASMA COAGULATION/BICAP);  Surgeon: Carol Ada, MD;  Location: [place];  Service: Endoscopy;  Laterality: N/A;  . IR ANGIOGRAM SELECTIVE EACH ADDITIONAL VESSEL  10/30/2018  . IR ANGIOGRAM SELECTIVE EACH ADDITIONAL VESSEL  10/30/2018  . IR ANGIOGRAM SELECTIVE EACH ADDITIONAL VESSEL  10/30/2018  . IR ANGIOGRAM SELECTIVE EACH ADDITIONAL VESSEL  10/30/2018  . IR ANGIOGRAM VISCERAL SELECTIVE  10/30/2018  . IR EMBO ART  VEN HEMORR LYMPH EXTRAV  INC GUIDE ROADMAPPING  10/30/2018  . IR US GUIDANCE  10/30/2018  . MITRAL VALVE REPAIR N/A 02/02/2019   Procedure: MITRAL VALVE REPAIR;  Surgeon: Sherren Mocha, MD;  Location: [place] CV LAB;  Service: Cardiovascular;  Laterality: N/A;  . POLYPECTOMY  10/28/2018   Procedure: POLYPECTOMY;  Surgeon: Juanita Craver, MD;  Location: [facility] ENDOSCOPY;  Service: Endoscopy;;  . RIGHT/LEFT HEART CATH AND CORONARY ANGIOGRAPHY N/A 12/29/2018   Procedure: RIGHT/LEFT HEART CATH AND CORONARY  ANGIOGRAPHY;  Surgeon: Leonie Man, MD;  Location: [place] CV LAB;  Service: Cardiovascular;  Laterality: N/A;  . TEE WITHOUT CARDIOVERSION N/A 12/30/2018   Procedure: TRANSESOPHAGEAL ECHOCARDIOGRAM (TEE);  Surgeon: Lelon Perla, MD;  Location: [facility] ENDOSCOPY;  Service: Cardiovascular;  Laterality: N/A;    Current Medications: Outpatient Medications Prior to Visit  Medication Sig Dispense Refill  . acetaminophen (TYLENOL) 325 MG tablet Take 650 mg by mouth every 6 (six) hours as needed for mild pain or headache.    Marland Kitchen atorvastatin (LIPITOR) 40 MG tablet TAKE 1 TABLET(40 MG) BY MOUTH DAILY AT 6 PM 90 tablet 1  . busPIRone (BUSPAR) 5 MG tablet TAKE 1 TABLET(5 MG) BY MOUTH TWICE DAILY 180 tablet 1  . colchicine 0.6 MG tablet Take 1 tablet (0.6 mg total) by mouth daily. 90 tablet 1  . cyanocobalamin 1000 MCG tablet Take 1 tablet (1,000 mcg total) by mouth daily. 90 tablet 0  . ferrous sulfate 324 MG TBEC Take 1 tablet (324 mg total) by mouth daily. 90 tablet 1  . furosemide (LASIX) 40 MG tablet Take 1 tablet (40 mg total) by mouth daily. 90 tablet  1  . metoprolol succinate (TOPROL-XL) 50 MG 24 hr tablet Take 1 tablet (50 mg total) by mouth daily. Take with or immediately following a meal. 90 tablet 3  . Multiple Vitamin (MULTIVITAMIN) tablet Take 1 tablet by mouth daily.    . pantoprazole (PROTONIX) 40 MG tablet TAKE 1 TABLET(40 MG) BY MOUTH DAILY 90 tablet 1  . ramelteon (ROZEREM) 8 MG tablet Take 1 tablet (8 mg total) by mouth at bedtime. 30 tablet 0   No facility-administered medications prior to visit.     Allergies:   Plavix [clopidogrel]   Social History   Socioeconomic History  . Marital status: Widowed    Spouse name: Chrissie Noa  . Number of children: 4  . Years of education: 36  . Highest education level: High school graduate  Occupational History  . Occupation: Retired     Comment: Press photographer  Tobacco Use  . Smoking status: Former Smoker    Packs/day: 0.30    Years:  8.00    Pack years: 2.40    Types: Cigarettes    Quit date: 05/30/2004    Years since quitting: 15.6  . Smokeless tobacco: Never Used  Vaping Use  . Vaping Use: Never used  Substance and Sexual Activity  . Alcohol use: No    Alcohol/week: 0.0 standard drinks  . Drug use: Never  . Sexual activity: Not Currently  Other Topics Concern  . Not on file  Social History Narrative   Emergency Contact: [name] [phone]   End of Life Plan: gave pt ad pamplet   Any pets: 1 Dog    Diet: pt has a varied diet, low consumption of carbs and fatty foods    Exercise: pt does not have a regular exercise routine    Seatbelts: pt wears seat regularly in car   Hobbies: spending time with grandchildren      *Updated 09/26/2018*   Patients daughter [name] lives with her and assists her in driving to doctors apts and errands. Patient does have a renewed drivers licenses, but feels more comfortable riding with her daughter. Patient enjoys going out to eat and spending time with her grandchildren who live in [place]. Patient stated they come during the summer to see her. Patient is excited her son, Vicente [name], is about to finish school to become a Theme park manager.    The patient has great family support system.    Social Determinants of Health   Financial Resource Strain:   . Difficulty of Paying Living Expenses: Not on file  Food Insecurity:   . Worried About Charity fundraiser in the Last Year: Not on file  . Ran Out of Food in the Last Year: Not on file  Transportation Needs:   . Lack of Transportation (Medical): Not on file  . Lack of Transportation (Non-Medical): Not on file  Physical Activity:   . Days of Exercise per Week: Not on file  . Minutes of Exercise per Session: Not on file  Stress:   . Feeling of Stress : Not on file  Social Connections:   . Frequency of Communication with Friends and Family: Not on file  . Frequency of Social Gatherings with Friends and Family: Not on file  . Attends  Religious Services: Not on file  . Active Member of Clubs or Organizations: Not on file  . Attends Archivist Meetings: Not on file  . Marital Status: Not on file     Family History:  The patient's family history includes Breast  cancer in her sister; Cancer in her sister; Heart disease in her mother; Hypertension in her father and sister.     ROS:   Please see the history of present illness.    ROS All other systems reviewed and are negative.   PHYSICAL EXAM:   VS:  BP (!) 146/66   Pulse (!) 119   Ht 5\' 2"  (1.575 m)   Wt 166 lb 6.4 oz (75.5 kg)   SpO2 97%   BMI 30.43 kg/m    GEN: Well nourished, well developed, in no acute distress HEENT: normal Neck: no JVD or masses Cardiac: RRR; soft murmur. No rubs, or gallops. No LE edema.  Respiratory:  clear to auscultation bilaterally, normal work of breathing GI: soft, nontender, nondistended, + BS MS: no deformity or atrophy Skin: warm and dry, no rash Neuro:  Alert and Oriented x 3, Strength and sensation are intact Psych: euthymic mood, full affect   Wt Readings from Last 3 Encounters:  01/24/20 166 lb 6.4 oz (75.5 kg)  10/18/19 166 lb 3.2 oz (75.4 kg)  10/04/19 168 lb (76.2 kg)      Studies/Labs Reviewed:   EKG:  EKG is ordered today.  ECG shows sinus tach, HR 119 ( she had just completed her 6 min walk test)  Recent Labs: 02/28/2019: Magnesium 2.0 05/26/2019: ALT 13 10/02/2019: B Natriuretic Peptide 1,087.1; Hemoglobin 12.8; Platelets 284 10/18/2019: BUN 34; Creatinine, Ser 1.47; Potassium 4.2; Sodium 143   Lipid Panel    Component Value Date/Time   CHOL 127 12/09/2018 0716   CHOL 231 (H) 10/14/2016 0930   TRIG 44 12/09/2018 0716   HDL 55 12/09/2018 0716   HDL 52 10/14/2016 0930   CHOLHDL 2.3 12/09/2018 0716   VLDL 9 12/09/2018 0716   LDLCALC 63 12/09/2018 0716   LDLCALC 151 (H) 10/14/2016 0930    Additional studies/ records that were reviewed today include:  02/02/19 MITRAL VALVE REPAIR   Conclusion Successful transcatheter edge-to-edge mitral valve repair with a single MitraClip NTR placed A2/P2, reducing MR from severe at baseline to 1-2+, LA V wave reduced by 50% from baseline  Recommendations  Antiplatelet/Anticoag Recommend uninterrupted dual antiplatelet therapy with Aspirin 81mg  daily and Clopidogrel 75mg  daily for 6 months.    _____________    Echo 02/03/19:  IMPRESSIONS 1. Left ventricular ejection fraction, by visual estimation, is 30 to 35%. The left ventricle has moderately decreased function. Left ventricular septal wall thickness was normal. There is mildly increased left ventricular hypertrophy. 2. Left ventricular diastolic function could not be evaluated pattern of LV diastolic filling. 3. Global right ventricle has normal systolic function.The right ventricular size is normal. No increase in right ventricular wall thickness. 4. Left atrial size was severely dilated. 5. Right atrial size was mildly dilated. 6. Trivial pericardial effusion is present. 7. The mitral valve has been repaired/replaced. Mild mitral valve regurgitation. Moderate mitral stenosis. 8. The tricuspid valve is normal in structure. Tricuspid valve regurgitation is mild. 9. The aortic valve is normal in structure. Aortic valve regurgitation was not visualized by color flow Doppler. 10. The pulmonic valve was not assessed. Pulmonic valve regurgitation was not assessed by color flow Doppler. 11. Aortic root could not be assessed. 12. Day 1 post MitraClip placement. Mitral regurgitation is mild. Transmitral gradients are elevated at 7 mmHg. LVEF decreased from prior the procedure from 50-55% to 30-35% with paradoxical septal motion. No pericardial effusion. 13. Iatrogenic left to right shunting.  _____________   03/02/19 IMPRESSIONS  1. Left  ventricular ejection fraction, by visual estimation, is 30 to 35%. The left ventricle has moderately decreased function. There is no  left ventricular hypertrophy.  2. There is akinesis of the inferoseptum and anteroseptum and global diffuse hypokinesis.  3. Left ventricular diastolic parameters are consistent with Grade II diastolic dysfunction (pseudonormalization).  4. Elevated left atrial and left ventricular end-diastolic pressures.  5. Global right ventricle has normal systolic function.The right ventricular size is normal. No increase in right ventricular wall thickness.  6. Left atrial size was severely dilated.  7. Right atrial size was normal.  8. S/P MitralClip placement. The mean transmitral valve gradient is 33mmHg. Mild to moderate mitral valve regurgitation.  9. The tricuspid valve is normal in structure. Tricuspid valve regurgitation is trivial. 10. The aortic valve is normal in structure. Aortic valve regurgitation is not visualized. No evidence of aortic valve sclerosis or stenosis. 11. The pulmonic valve was normal in structure. Pulmonic valve regurgitation is not visualized. 12. Normal pulmonary artery systolic pressure. 13. The inferior vena cava is normal in size with greater than 50% respiratory variability, suggesting right atrial pressure of 3 mmHg.   ____________________   Echo 01/24/2020 IMPRESSIONS  1. Left ventricular ejection fraction, by estimation, is 20 to 25%. The left ventricle has severely decreased function. The left ventricle demonstrates global hypokinesis. The left ventricular internal cavity size was severely dilated. There is mild left ventricular hypertrophy. Left ventricular diastolic parameters are indeterminate.  2. Right ventricular systolic function is normal. The right ventricular size is normal.  3. Left atrial size was severely dilated.  4. Post single NTR clip to A2/P2 done on 01/2019. Post proceedure there was plus 1-2 residual MR. MR appears worse than echo done 03/02/19 but still in moderate range. Mean gradient in diastole slightly higher 5- 7.8 mmHg suggesting that clip  still in  place. Anterior/Posterior grasp appears intact on 2 and 3 chamber views. . The mitral valve has been repaired/replaced. Moderate mitral valve regurgitation. No evidence of mitral stenosis.  5. Given degree of LV dysfunciton and valve morphology likely overall mild-moderate AS despte lower gradients . The aortic valve is tricuspid. Aortic valve regurgitation is not visualized. Mild to moderate aortic valve stenosis.   ASSESSMENT & PLAN:   Severe MR s/p MitraClip: echo today shows EF 20-25%, s/p single NTR clip to A2/P2 done on 01/2019. Post proceedure there was plus 1-2 residual MR. MR today appears worse than echo done 03/02/19 but still in moderate range. Mean gradient in diastole slightly higher 5-7.8 mmHg suggesting that clip still in place. Anterior/Posterior grasp appears intact on 2 and 3 chamber views. She is doing quite well clinially with NYHA class I symptoms. SBE prophylaxis discussed; she has amoxicillin. Continue off all antiplatelets given recurrent GI bleeding. Follow up with Dr. Johnsie Cancel in 6 months.   Chronic combined S/D CHF: appears euvolemic. Continue lasix 40mg  daily and Toprol XL 25mg  daily. Losartan discontinued during recent admission for AKI.  CKD stage III: creat has remained stable around 1.5  Chronic anemia with hx of recent GI bleed: no longer on any antiplatelets.     Aortic stenosis: mild-moderate on echo today.   Medication Adjustments/Labs and Tests Ordered: Current medicines are reviewed at length with the patient today.  Concerns regarding medicines are outlined above.  Medication changes, Labs and Tests ordered today are listed in the Patient Instructions below. Patient Instructions  We will call you after your echo results are official to plan next steps/follow up appointments.  Signed, Angelena Form, PA-C  01/24/2020 2:50 PM    [facility] [address], [place], [place]  [address] Phone: [phone]; Fax: [phone]

## 2020-01-24 ENCOUNTER — Encounter: Payer: Self-pay | Admitting: Physician Assistant

## 2020-01-24 ENCOUNTER — Other Ambulatory Visit: Payer: Self-pay

## 2020-01-24 ENCOUNTER — Ambulatory Visit (HOSPITAL_COMMUNITY): Payer: Medicare Other | Attending: Cardiovascular Disease

## 2020-01-24 ENCOUNTER — Ambulatory Visit: Payer: Medicare Other | Admitting: Physician Assistant

## 2020-01-24 VITALS — BP 146/66 | HR 119 | Ht 62.0 in | Wt 166.4 lb

## 2020-01-24 DIAGNOSIS — I350 Nonrheumatic aortic (valve) stenosis: Secondary | ICD-10-CM

## 2020-01-24 DIAGNOSIS — D649 Anemia, unspecified: Secondary | ICD-10-CM | POA: Diagnosis not present

## 2020-01-24 DIAGNOSIS — Z9889 Other specified postprocedural states: Secondary | ICD-10-CM

## 2020-01-24 DIAGNOSIS — N183 Chronic kidney disease, stage 3 unspecified: Secondary | ICD-10-CM

## 2020-01-24 DIAGNOSIS — I509 Heart failure, unspecified: Secondary | ICD-10-CM | POA: Diagnosis not present

## 2020-01-24 DIAGNOSIS — I130 Hypertensive heart and chronic kidney disease with heart failure and stage 1 through stage 4 chronic kidney disease, or unspecified chronic kidney disease: Secondary | ICD-10-CM | POA: Insufficient documentation

## 2020-01-24 DIAGNOSIS — N189 Chronic kidney disease, unspecified: Secondary | ICD-10-CM | POA: Insufficient documentation

## 2020-01-24 DIAGNOSIS — I5042 Chronic combined systolic (congestive) and diastolic (congestive) heart failure: Secondary | ICD-10-CM | POA: Diagnosis not present

## 2020-01-24 DIAGNOSIS — Z954 Presence of other heart-valve replacement: Secondary | ICD-10-CM | POA: Diagnosis not present

## 2020-01-24 DIAGNOSIS — E785 Hyperlipidemia, unspecified: Secondary | ICD-10-CM | POA: Insufficient documentation

## 2020-01-24 DIAGNOSIS — I080 Rheumatic disorders of both mitral and aortic valves: Secondary | ICD-10-CM | POA: Insufficient documentation

## 2020-01-24 DIAGNOSIS — F0390 Unspecified dementia without behavioral disturbance: Secondary | ICD-10-CM | POA: Diagnosis not present

## 2020-01-24 LAB — ECHOCARDIOGRAM COMPLETE
MV M vel: 4.89 m/s
MV Peak grad: 95.5 mmHg
Radius: 0.5 cm
S' Lateral: 3.9 cm

## 2020-01-24 NOTE — Patient Instructions (Signed)
We will call you after your echo results are official to plan next steps/follow up appointments.

## 2020-01-24 NOTE — Progress Notes (Signed)
6 Minute Walk Test Results  Patient: Hannah [name] Date:  01/24/2020   Supplemental O2 during test? No      Baseline   End  Time   1404    1410 Heartrate  104    139 Dyspnea  None    None Fatigue  None    None O2 sat   95%    95% Blood pressure 137/79    146/66   Patient ambulated at a normal pace for a total distance of 606 feet with 2 stops (one to go to the bathroom and the other for water)  Ambulation was limited primarily due to: no limitations  Overall the test was tolerated well.

## 2020-01-30 NOTE — Addendum Note (Signed)
Addended by: Rose Phi on: 01/30/2020 01:15 PM   Modules accepted: Orders

## 2020-02-21 ENCOUNTER — Other Ambulatory Visit: Payer: Self-pay | Admitting: *Deleted

## 2020-02-21 DIAGNOSIS — E538 Deficiency of other specified B group vitamins: Secondary | ICD-10-CM

## 2020-02-21 MED ORDER — CYANOCOBALAMIN 1000 MCG PO TABS: 1000.0000 ug | ORAL_TABLET | Freq: Every day | ORAL | 1 refills | Status: DC

## 2020-04-17 ENCOUNTER — Other Ambulatory Visit: Payer: Self-pay | Admitting: *Deleted

## 2020-04-17 MED ORDER — COLCHICINE 0.6 MG PO TABS
0.6000 mg | ORAL_TABLET | Freq: Every day | ORAL | 1 refills | Status: DC
Start: 2020-04-17 — End: 2020-05-01

## 2020-04-30 ENCOUNTER — Other Ambulatory Visit: Payer: Self-pay

## 2020-04-30 ENCOUNTER — Ambulatory Visit (INDEPENDENT_AMBULATORY_CARE_PROVIDER_SITE_OTHER): Payer: Medicare HMO | Admitting: Family Medicine

## 2020-04-30 VITALS — BP 114/72 | HR 93 | Wt 170.4 lb

## 2020-04-30 DIAGNOSIS — Z23 Encounter for immunization: Secondary | ICD-10-CM

## 2020-04-30 DIAGNOSIS — N183 Chronic kidney disease, stage 3 unspecified: Secondary | ICD-10-CM | POA: Diagnosis not present

## 2020-04-30 DIAGNOSIS — D62 Acute posthemorrhagic anemia: Secondary | ICD-10-CM

## 2020-04-30 DIAGNOSIS — I672 Cerebral atherosclerosis: Secondary | ICD-10-CM | POA: Diagnosis not present

## 2020-04-30 DIAGNOSIS — F0151 Vascular dementia with behavioral disturbance: Secondary | ICD-10-CM | POA: Diagnosis not present

## 2020-04-30 DIAGNOSIS — E785 Hyperlipidemia, unspecified: Secondary | ICD-10-CM

## 2020-04-30 DIAGNOSIS — I10 Essential (primary) hypertension: Secondary | ICD-10-CM

## 2020-04-30 DIAGNOSIS — F01518 Vascular dementia, unspecified severity, with other behavioral disturbance: Secondary | ICD-10-CM

## 2020-04-30 NOTE — Progress Notes (Signed)
   Covid-19 Vaccination Clinic  Name:  ALVERA TOURIGNY    MRN: [id number] DOB: 1945/11/28  04/30/2020  [name] was observed post Covid-19 immunization for 15 minutes without incident. She was provided with Vaccine Information Sheet and instruction to access the V-Safe system.   [name] was instructed to call 911 with any severe reactions post vaccine: Marland Kitchen Difficulty breathing  . Swelling of face and throat  . A fast heartbeat  . A bad rash all over body  . Dizziness and weakness

## 2020-04-30 NOTE — Assessment & Plan Note (Signed)
BP Readings from Last 3 Encounters:  04/30/20 114/72  01/24/20 (!) 146/66  10/18/19 110/62   At goal.  Continue currrent medications.  Check labs

## 2020-04-30 NOTE — Assessment & Plan Note (Signed)
Remarkably stable.  A bit concerning that she is off all antiplatelets (due to recurrent bleeding).  Will check cbc.  Since she is doing so well hate to change things but is at high risk for CVA or AMS.  Discuss with family next visit

## 2020-04-30 NOTE — Assessment & Plan Note (Addendum)
Continue Lipitor.  Check LDL   Not at goal increase Lipitor to 80 mg daily.  Decrease colchicine to qod.

## 2020-04-30 NOTE — Progress Notes (Signed)
    SUBJECTIVE:   CHIEF COMPLAINT / HPI:   Feeling well living with her daughter [name].  No complaints  EDEMA CHF Taking lasix daily.  No lightheadness. .  No leg swelling Mild shortness of breath when walks a lot no chest pain  DEMENTIA Living with daughter. Calm most of the time.  Taking buspar twice a day. Occassions of anxiety that respond to an extra buspar as needed   BLEEDING No lightheadness or chest pain or noticing any melena.  Taking iron.  Not taking aspirin or plavix  PERTINENT  PMH / PSH: brings all her medications   OBJECTIVE:   BP 114/72   Pulse 93   Wt 170 lb 6.4 oz (77.3 kg)   SpO2 95%   BMI 31.17 kg/m   Conversant oriented to self and daughter and myself  Heart - Regular rate and rhythm.  No murmurs, gallops or rubs.    Lungs:  Normal respiratory effort, chest expands symmetrically. Lungs are clear to auscultation, no crackles or wheezes. Extremities:  No cyanosis, edema, or deformity noted with good range of motion of all major joints.     ASSESSMENT/PLAN:   Essential hypertension BP Readings from Last 3 Encounters:  04/30/20 114/72  01/24/20 (!) 146/66  10/18/19 110/62   At goal.  Continue currrent medications.  Check labs   Anemia due to acute blood loss Asymptomatic.  Check cbc and ferritin.  Maybe able to stop iron and B12.    HLD (hyperlipidemia) Continue Lipitor.  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  A bit concerning that she is off all antiplatelets (due to recurrent bleeding).  Will check cbc.  Since she is doing so well hate to change things but is at high risk for CVA or AMS.  Discuss with family next visit      [name], MD [place]

## 2020-04-30 NOTE — Assessment & Plan Note (Signed)
Asymptomatic.  Check cbc and ferritin.  Maybe able to stop iron and B12.

## 2020-04-30 NOTE — Patient Instructions (Addendum)
Good to see you today!  Thanks for coming in.  For the colchicine - take every other day to see if makes any difference  Continue all the other medication as you are  I will call you if your tests are not good.  Otherwise, I will send you a message on MyChart (if it is active) or a letter in the mail..  If you do not hear from me with in 2 weeks please call our office.     Keep doing what you are doing  Come back in 6 months

## 2020-05-01 ENCOUNTER — Encounter: Payer: Self-pay | Admitting: Family Medicine

## 2020-05-01 LAB — CBC
Hematocrit: 40 % (ref 34.0–46.6)
Hemoglobin: 13.6 g/dL (ref 11.1–15.9)
MCH: 32.5 pg (ref 26.6–33.0)
MCHC: 34 g/dL (ref 31.5–35.7)
MCV: 96 fL (ref 79–97)
Platelets: 237 10*3/uL (ref 150–450)
RBC: 4.19 x10E6/uL (ref 3.77–5.28)
RDW: 11.9 % (ref 11.7–15.4)
WBC: 8.4 10*3/uL (ref 3.4–10.8)

## 2020-05-01 LAB — CMP14+EGFR
ALT: 10 IU/L (ref 0–32)
AST: 18 IU/L (ref 0–40)
Albumin/Globulin Ratio: 1.6 (ref 1.2–2.2)
Albumin: 4.4 g/dL (ref 3.7–4.7)
Alkaline Phosphatase: 91 IU/L (ref 44–121)
BUN/Creatinine Ratio: 16 (ref 12–28)
BUN: 20 mg/dL (ref 8–27)
Bilirubin Total: 0.8 mg/dL (ref 0.0–1.2)
CO2: 27 mmol/L (ref 20–29)
Calcium: 9.9 mg/dL (ref 8.7–10.3)
Chloride: 96 mmol/L (ref 96–106)
Creatinine, Ser: 1.28 mg/dL — ABNORMAL HIGH (ref 0.57–1.00)
GFR calc Af Amer: 47 mL/min/{1.73_m2} — ABNORMAL LOW (ref >59)
GFR calc non Af Amer: 41 mL/min/{1.73_m2} — ABNORMAL LOW (ref >59)
Globulin, Total: 2.7 g/dL (ref 1.5–4.5)
Glucose: 132 mg/dL — ABNORMAL HIGH (ref 65–99)
Potassium: 3.7 mmol/L (ref 3.5–5.2)
Sodium: 139 mmol/L (ref 134–144)
Total Protein: 7.1 g/dL (ref 6.0–8.5)

## 2020-05-01 LAB — FERRITIN: Ferritin: 202 ng/mL — ABNORMAL HIGH (ref 15–150)

## 2020-05-01 LAB — LDL CHOLESTEROL, DIRECT: LDL Direct: 90 mg/dL (ref 0–99)

## 2020-05-01 MED ORDER — ATORVASTATIN CALCIUM 80 MG PO TABS: 80.0000 mg | ORAL_TABLET | Freq: Every day | ORAL | 2 refills | Status: DC

## 2020-05-01 NOTE — Addendum Note (Signed)
Addended by: Talbert Cage L on: 05/01/2020 10:16 AM   Modules accepted: Orders

## 2020-06-12 ENCOUNTER — Other Ambulatory Visit: Payer: Self-pay | Admitting: Family Medicine

## 2020-06-12 DIAGNOSIS — I5042 Chronic combined systolic (congestive) and diastolic (congestive) heart failure: Secondary | ICD-10-CM

## 2020-06-17 ENCOUNTER — Other Ambulatory Visit: Payer: Self-pay | Admitting: Cardiovascular Disease

## 2020-06-17 ENCOUNTER — Other Ambulatory Visit: Payer: Self-pay | Admitting: Family Medicine

## 2020-06-17 DIAGNOSIS — K922 Gastrointestinal hemorrhage, unspecified: Secondary | ICD-10-CM

## 2020-06-17 DIAGNOSIS — I5042 Chronic combined systolic (congestive) and diastolic (congestive) heart failure: Secondary | ICD-10-CM

## 2020-06-18 ENCOUNTER — Other Ambulatory Visit: Payer: Self-pay | Admitting: Family Medicine

## 2020-06-18 DIAGNOSIS — E785 Hyperlipidemia, unspecified: Secondary | ICD-10-CM

## 2020-06-23 ENCOUNTER — Telehealth: Payer: Self-pay | Admitting: Family Medicine

## 2020-06-23 MED ORDER — COLCHICINE 0.6 MG PO TABS: 0.6000 mg | ORAL_TABLET | ORAL | 1 refills | Status: DC

## 2020-06-23 NOTE — Telephone Encounter (Signed)
Received call from [facility] line regarding gout flare. Patient's daughter reports acute gout flare of great toe which started yesterday and is causing increased pain. Her symptoms are typical of her usual gout flares. Her last flare was one year ago. Diet modifications have helped keep flares at [place]. She would like medication to help stop the flare. Last clinic visit was with Dr Erin Hearing in Jan 22. Pt is now out of colchicine. I explained that I will sent in a prescription of colchicine, initially 1.2mg  daily, followed by 0.6mg  one hour later, followed by 0.6mg  daily. PCP unavailable this week. Booked follow up with Dr Andria Frames for follow up later on Wednesday 9th March.  Will forward to patient's PCP Dr Erin Hearing.   Lattie Haw MD PGY-2, Family Medicine

## 2020-06-24 ENCOUNTER — Ambulatory Visit: Payer: Medicare Other | Admitting: Cardiovascular Disease

## 2020-06-24 ENCOUNTER — Other Ambulatory Visit: Payer: Self-pay | Admitting: Family Medicine

## 2020-06-24 MED ORDER — COLCHICINE 0.6 MG PO CAPS: 0.6000 mg | ORAL_CAPSULE | Freq: Every day | ORAL | 0 refills | Status: DC

## 2020-06-24 NOTE — Telephone Encounter (Signed)
Patients daughter calls nurse line stating colchicine is not covered by her insurance and the cash price is unaffordable. Patients daughter reports her insurance will cover brand name Mitigare. Please send in to [facility] on [address]. Will forward to PCP and ordering provider.

## 2020-06-24 NOTE — Telephone Encounter (Signed)
I Have sent in appropriate prescription of Mitigare. Please could you kindly inform the pt. Thank you!

## 2020-06-24 NOTE — Telephone Encounter (Signed)
Patient's daughter returns call to nurse line regarding medication.  There has been some confusion on what medication is covered by the insurance.   Per pharmacist, Colcrys is the medication that is mostly covered by insurance for $95 for 28 days. However, per formulary allopurinol seems to be more cost effective.   Please advise.   Talbot Grumbling, RN

## 2020-06-25 ENCOUNTER — Other Ambulatory Visit: Payer: Self-pay | Admitting: Family Medicine

## 2020-06-25 MED ORDER — PREDNISONE 20 MG PO TABS: 40.0000 mg | ORAL_TABLET | Freq: Every day | ORAL | 0 refills | Status: AC

## 2020-06-25 NOTE — Telephone Encounter (Signed)
HI team, I have prescribed prednisone 40mg  daily for 5 days. Please could you inform the pt. I would also recommend she comes into the clinic for follow up. I did book a clinic app for her for tomorrow but looks like she cancelled it. Thank you.

## 2020-06-25 NOTE — Telephone Encounter (Signed)
Unfortunately allopurinol is not a treatment for acute gout flare so is not indicated for this pt. Can we give coupons for colchicine? Alternatively she can have a short course of steroids but she should really be seen in clinic for this. Please could you inform the pt? Thank you.

## 2020-06-25 NOTE — Telephone Encounter (Signed)
Patients daughter calls again. Colchicine and brand name Mitigare have high copays. If appropriate can we send in Allopurinol which should be a lower copay. We can also use a good rx coupon for Allopurinol. Please advise.

## 2020-06-25 NOTE — Telephone Encounter (Signed)
Can we switch it to tablets instead of capsules? Tabs are cheaper and if it is still expensive on insurance patient can go to goodrx and see prices of pharmacies and which price and pharmacy she prefers. If she wants it elsewhere based on goodrx pricing she will have to call pharmacy and have it transferred from [facility].

## 2020-06-25 NOTE — Telephone Encounter (Signed)
Can you check to make sure [place] follow through. Thanks  Truman Hayward. (In the snow)

## 2020-06-25 NOTE — Telephone Encounter (Signed)
Will route to pharmacy team as well to see if there are any medication assistance programs/ coupons for this medication.   Please advise.   Talbot Grumbling, RN

## 2020-06-26 ENCOUNTER — Ambulatory Visit: Payer: Medicare HMO | Admitting: Family Medicine

## 2020-06-26 NOTE — Telephone Encounter (Signed)
Thank you very much Dr Valentina Lucks.

## 2020-06-26 NOTE — Telephone Encounter (Signed)
Noted and agree. 

## 2020-06-26 NOTE — Telephone Encounter (Signed)
Contacted patient's daughter to discuss use of prednisone as the alternative to costly colchicine.    Discussed purpose, proper use and potential adverse effects of steroid psychosis, increased appetite and potential for blood sugar elevation (patient does not have glucose intolerance/diabetes).  Following instruction patient's care provider verbalized understanding of treatment plan.   Patient's care provider (daughter)  plans to pick-up and and start prednisone today.  She will monitor for significant mental status change and will discontinue prednisone if this occurs.   We briefly discussed allopurinol's role in gout.  As she has had multiple flares in the past allopurinol could be considered by PCP at next visit.   One consideration for this scenario could be a trial of topical NSAID as alternative abortive treatment.

## 2020-06-26 NOTE — Telephone Encounter (Signed)
Called mobile number. LVM asking for a return call to our office regarding medication that was sent to pharmacy and to schedule a follow up appt for taking the medication. No personal information was left on VM. Ottis Stain, CMA

## 2020-06-27 NOTE — Telephone Encounter (Signed)
Spoke with patient's daughter today who was returning call to [place]. Daughter states that she will return call to office on Monday to schedule follow up appointment.   Talbot Grumbling, RN

## 2020-07-04 NOTE — Telephone Encounter (Signed)
Error

## 2020-07-08 ENCOUNTER — Telehealth: Payer: Self-pay | Admitting: Family Medicine

## 2020-07-08 NOTE — Telephone Encounter (Signed)
Call from daughter Toe that was hurting and got better with prednisone is now a little more painful.  Wondering if should buy colchicine  Able to walk with mild pain.  No fever.  Suggest try tylenol and aspercreme regularly for next few days.  Call if worsening

## 2020-07-11 ENCOUNTER — Other Ambulatory Visit: Payer: Self-pay | Admitting: Family Medicine

## 2020-07-12 ENCOUNTER — Ambulatory Visit: Payer: Medicare HMO | Admitting: Cardiovascular Disease

## 2020-07-12 NOTE — Progress Notes (Deleted)
Virtual Visit via Video Note   This visit type was conducted due to national recommendations for restrictions regarding the COVID-19 Pandemic (e.g. social distancing) in an effort to limit this patient's exposure and mitigate transmission in our community.  Due to her co-morbid illnesses, this patient is at least at moderate risk for complications without adequate follow up.  This format is felt to be most appropriate for this patient at this time.  All issues noted in this document were discussed and addressed.  A limited physical exam was performed with this format.  Please refer to the patient's chart for her consent to telehealth for [facility].   Patient location: Home Physician location: Office  Date:  07/12/2020   ID:  Hannah [name], DOB [birth year], MRN [id number]  PCP:  [name], MD  Cardiologist: Dr. Johnsie Cancel   History of Present Illness:  Hannah [name] is a 75 y.o. female with a history of chronic diastolic CHF, HTN, dementia, GI bleeding s/p mesenteric artery coil embolization,anxiety and severe mitral regurgitation s/p MitraClip (02/02/19) who presents to clinic for follow up.    Had successful transcatheter edge-to-edge mitral valve repair with a single MitraClip NTR placed A2/P2, reducing MR from severe at baseline to 1-2+, LA V wave reduced by 50% from baseline. Post op echo showed EF 30-35%, with mild residual MR and moderate MS (transmitral gradient 7 mm HG). Given reduction in EF, her lopressor was changed to Toprol Xl 25mg  daily and Losartan 25mg  daily was added. She was discharged on aspirin and plavix.   Unfortunately, she was readmitted from 11/3-11/10/20 for symptomatic anemia 2/2 GI bleed. Hg down to 5.7. She was transfused. FOBT positive. Enteroscopy was performed on 11/6 showing normal esophagus, normal stomach, normal duodenum; however, multiple angiodysplastic lesions with stigmata of recent bleeding were found in the  proximal jejunum. Coagulation was successful.  She was continued on Protonix 40mg  daily. She had AKI and losartan discontinued and lasix decreased to 40mg  daily. Plavix was discontinued and she was discharged to [facility]. 1 month echo showed EF 30-35%, normally functioning MitraClip with mild mod resudual MR and mild MS with a mean gradient 5 mm HG.   She was readmitted in 05/2019 with recurrent GI bleed. Aspirin was discontinued indefinitely. Then readmitted in 09/2019 for acute CHF and diuresed well with IV lasix and discharged on home dose of lasix.    She is active playing with grandchildren Daughter with drug addiction not living with her anymore Living with her daughter Hannah [name]   ***   Past Medical History:  Diagnosis Date  . Anemia   . Anemia of chronic disease   . Anxiety   . Arthritis    "hands" (05/09/2015)  . Chronic diastolic heart failure ([place])   . CKD (chronic kidney disease) stage 3, GFR 30-59 ml/min (HCC)   . Dementia ([place])   . Elevated TSH 02/03/2018  . GERD (gastroesophageal reflux disease)   . HLD (hyperlipidemia)   . Hypertension   . LBBB (left bundle branch block)   . Mitral valve insufficiency   . Nonrheumatic mitral valve regurgitation 02/02/2019  . Osteoarthritis   . 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 S/P mitral valve repair    s/p MitraClip on 02/03/19  . Vitamin B12 deficiency 12/28/2018    Past Surgical History:  Procedure Laterality Date  . BIOPSY  10/28/2018   Procedure: BIOPSY;  Surgeon: Juanita Craver, MD;  Location: [facility] ENDOSCOPY;  Service: Endoscopy;;  . CARDIAC CATHETERIZATION  05/2004  . COLONOSCOPY  07/2002   Archie Endo 09/02/2010  . COLONOSCOPY WITH PROPOFOL N/A 10/28/2018   Procedure: COLONOSCOPY WITH PROPOFOL;  Surgeon: Juanita Craver, MD;  Location: [facility] ENDOSCOPY;  Service: Endoscopy;  Laterality: N/A;  . ENTEROSCOPY N/A 02/24/2019   Procedure: ENTEROSCOPY;  Surgeon: Carol Ada, MD;  Location: [place];  Service: Endoscopy;  Laterality: N/A;  .  ENTEROSCOPY N/A 05/23/2019   Procedure: ENTEROSCOPY;  Surgeon: Carol Ada, MD;  Location: [facility] ENDOSCOPY;  Service: Endoscopy;  Laterality: N/A;  . ESOPHAGOGASTRODUODENOSCOPY  07/2002   w/biopsies/notes 09/02/2010  . ESOPHAGOGASTRODUODENOSCOPY (EGD) WITH PROPOFOL N/A 10/28/2018   Procedure: ESOPHAGOGASTRODUODENOSCOPY (EGD) WITH PROPOFOL;  Surgeon: Juanita Craver, MD;  Location: [facility] ENDOSCOPY;  Service: Endoscopy;  Laterality: N/A;  . GIVENS CAPSULE STUDY N/A 02/23/2019   Procedure: GIVENS CAPSULE STUDY;  Surgeon: Carol Ada, MD;  Location: [place];  Service: Endoscopy;  Laterality: N/A;  . HOT HEMOSTASIS N/A 10/28/2018   Procedure: HOT HEMOSTASIS (ARGON PLASMA COAGULATION/BICAP);  Surgeon: Juanita Craver, MD;  Location: [facility] ENDOSCOPY;  Service: Endoscopy;  Laterality: N/A;  . HOT HEMOSTASIS N/A 02/24/2019   Procedure: HOT HEMOSTASIS (ARGON PLASMA COAGULATION/BICAP);  Surgeon: Carol Ada, MD;  Location: [place];  Service: Endoscopy;  Laterality: N/A;  . HOT HEMOSTASIS N/A 05/23/2019   Procedure: HOT HEMOSTASIS (ARGON PLASMA COAGULATION/BICAP);  Surgeon: Carol Ada, MD;  Location: [place];  Service: Endoscopy;  Laterality: N/A;  . IR ANGIOGRAM SELECTIVE EACH ADDITIONAL VESSEL  10/30/2018  . IR ANGIOGRAM SELECTIVE EACH ADDITIONAL VESSEL  10/30/2018  . IR ANGIOGRAM SELECTIVE EACH ADDITIONAL VESSEL  10/30/2018  . IR ANGIOGRAM SELECTIVE EACH ADDITIONAL VESSEL  10/30/2018  . IR ANGIOGRAM VISCERAL SELECTIVE  10/30/2018  . IR EMBO ART  VEN HEMORR LYMPH EXTRAV  INC GUIDE ROADMAPPING  10/30/2018  . IR US GUIDANCE  10/30/2018  . MITRAL VALVE REPAIR N/A 02/02/2019   Procedure: MITRAL VALVE REPAIR;  Surgeon: Sherren Mocha, MD;  Location: [place] CV LAB;  Service: Cardiovascular;  Laterality: N/A;  . POLYPECTOMY  10/28/2018   Procedure: POLYPECTOMY;  Surgeon: Juanita Craver, MD;  Location: [facility] ENDOSCOPY;  Service: Endoscopy;;  . RIGHT/LEFT HEART CATH AND CORONARY ANGIOGRAPHY N/A 12/29/2018   Procedure:  RIGHT/LEFT HEART CATH AND CORONARY ANGIOGRAPHY;  Surgeon: Leonie Man, MD;  Location: [place] CV LAB;  Service: Cardiovascular;  Laterality: N/A;  . TEE WITHOUT CARDIOVERSION N/A 12/30/2018   Procedure: TRANSESOPHAGEAL ECHOCARDIOGRAM (TEE);  Surgeon: Lelon Perla, MD;  Location: [facility] ENDOSCOPY;  Service: Cardiovascular;  Laterality: N/A;    Current Medications: Outpatient Medications Prior to Visit  Medication Sig Dispense Refill  . acetaminophen (TYLENOL) 325 MG tablet Take 650 mg by mouth every 6 (six) hours as needed for mild pain or headache.    Marland Kitchen atorvastatin (LIPITOR) 80 MG tablet Take 1 tablet (80 mg total) by mouth daily. 90 tablet 2  . busPIRone (BUSPAR) 5 MG tablet TAKE 1 TABLET(5 MG) BY MOUTH TWICE DAILY 180 tablet 3  . Colchicine (MITIGARE) 0.6 MG CAPS Take 0.6 mg by mouth daily. 1.2mg  first dose, followed by 0.6mg  1 hour later. Followed by 0.6mg  daily. 30 capsule 0  . cyanocobalamin 1000 MCG tablet Take 1 tablet (1,000 mcg total) by mouth daily. 30 tablet 1  . ferrous sulfate 324 MG TBEC Take 1 tablet (324 mg total) by mouth daily. 90 tablet 1  . furosemide (LASIX) 40  MG tablet TAKE 1 TABLET(40 MG) BY MOUTH DAILY 90 tablet 0  . Melatonin 5 MG CAPS Take 10 mg by mouth at bedtime.    . metoprolol succinate (TOPROL-XL) 50 MG 24 hr tablet TAKE 1 TABLET(50 MG) BY MOUTH DAILY WITH OR IMMEDIATELY FOLLOWING A MEAL 90 tablet 2  . Multiple Vitamin (MULTIVITAMIN) tablet Take 1 tablet by mouth daily.    . pantoprazole (PROTONIX) 40 MG tablet TAKE 1 TABLET(40 MG) BY MOUTH DAILY 90 tablet 1   No facility-administered medications prior to visit.     Allergies:   Plavix [clopidogrel]   Social History   Socioeconomic History  . Marital status: Widowed    Spouse name: Chrissie Noa  . Number of children: 4  . Years of education: 74  . Highest education level: High school graduate  Occupational History  . Occupation: Retired     Comment: Press photographer  Tobacco Use  . Smoking status:  Former Smoker    Packs/day: 0.30    Years: 8.00    Pack years: 2.40    Types: Cigarettes    Quit date: 05/30/2004    Years since quitting: 16.1  . Smokeless tobacco: Never Used  Vaping Use  . Vaping Use: Never used  Substance and Sexual Activity  . Alcohol use: No    Alcohol/week: 0.0 standard drinks  . Drug use: Never  . Sexual activity: Not Currently  Other Topics Concern  . Not on file  Social History Narrative   Emergency Contact: [name] [phone]   End of Life Plan: gave pt ad pamplet   Any pets: 1 Dog    Diet: pt has a varied diet, low consumption of carbs and fatty foods    Exercise: pt does not have a regular exercise routine    Seatbelts: pt wears seat regularly in car   Hobbies: spending time with grandchildren      *Updated 09/26/2018*   Patients daughter [name] lives with her and assists her in driving to doctors apts and errands. Patient does have a renewed drivers licenses, but feels more comfortable riding with her daughter. Patient enjoys going out to eat and spending time with her grandchildren who live in [place]. Patient stated they come during the summer to see her. Patient is excited her son, Vicente Males, is about to finish school to become a Theme park manager.    The patient has great family support system.    Social Determinants of Health   Financial Resource Strain: Not on file  Food Insecurity: Not on file  Transportation Needs: Not on file  Physical Activity: Not on file  Stress: Not on file  Social Connections: Not on file     Family History:  The patient's family history includes Breast cancer in her sister; Cancer in her sister; Heart disease in her mother; Hypertension in her father and sister.     ROS:   Please see the history of present illness.    ROS All other systems reviewed and are negative.   PHYSICAL EXAM:   VS:  There were no vitals taken for this visit.   No distress No JVP elevation  No tachypnea     Wt Readings from Last 3  Encounters:  04/30/20 77.3 kg  01/24/20 75.5 kg  10/18/19 75.4 kg      Studies/Labs Reviewed:   EKG:   ST LBBB 01/24/20   Recent Labs: 10/02/2019: B Natriuretic Peptide 1,087.1 04/30/2020: ALT 10; BUN 20; Creatinine, Ser 1.28; Hemoglobin 13.6; Platelets 237; Potassium 3.7; Sodium  139   Lipid Panel    Component Value Date/Time   CHOL 127 12/09/2018 0716   CHOL 231 (H) 10/14/2016 0930   TRIG 44 12/09/2018 0716   HDL 55 12/09/2018 0716   HDL 52 10/14/2016 0930   CHOLHDL 2.3 12/09/2018 0716   VLDL 9 12/09/2018 0716   LDLCALC 63 12/09/2018 0716   LDLCALC 151 (H) 10/14/2016 0930   LDLDIRECT 90 04/30/2020 1119    Additional studies/ records that were reviewed today include:  02/02/19 MITRAL VALVE REPAIR  Conclusion Successful transcatheter edge-to-edge mitral valve repair with a single MitraClip NTR placed A2/P2, reducing MR from severe at baseline to 1-2+, LA V wave reduced by 50% from baseline  Recommendations  Antiplatelet/Anticoag Recommend uninterrupted dual antiplatelet therapy with Aspirin 81mg  daily and Clopidogrel 75mg  daily for 6 months.   _____________   03/02/19 IMPRESSIONS  1. Left ventricular ejection fraction, by visual estimation, is 30 to 35%. The left ventricle has moderately decreased function. There is no left ventricular hypertrophy.  2. There is akinesis of the inferoseptum and anteroseptum and global diffuse hypokinesis.  3. Left ventricular diastolic parameters are consistent with Grade II diastolic dysfunction (pseudonormalization).  4. Elevated left atrial and left ventricular end-diastolic pressures.  5. Global right ventricle has normal systolic function.The right ventricular size is normal. No increase in right ventricular wall thickness.  6. Left atrial size was severely dilated.  7. Right atrial size was normal.  8. S/P MitralClip placement. The mean transmitral valve gradient is 81mmHg. Mild to moderate mitral valve regurgitation.  9. The  tricuspid valve is normal in structure. Tricuspid valve regurgitation is trivial. 10. The aortic valve is normal in structure. Aortic valve regurgitation is not visualized. No evidence of aortic valve sclerosis or stenosis. 11. The pulmonic valve was normal in structure. Pulmonic valve regurgitation is not visualized. 12. Normal pulmonary artery systolic pressure. 13. The inferior vena cava is normal in size with greater than 50% respiratory variability, suggesting right atrial pressure of 3 mmHg.   ____________________   Echo 01/24/2020 IMPRESSIONS  1. Left ventricular ejection fraction, by estimation, is 20 to 25%. The left ventricle has severely decreased function. The left ventricle demonstrates global hypokinesis. The left ventricular internal cavity size was severely dilated. There is mild left ventricular hypertrophy. Left ventricular diastolic parameters are indeterminate.  2. Right ventricular systolic function is normal. The right ventricular size is normal.  3. Left atrial size was severely dilated.  4. Post single NTR clip to A2/P2 done on 01/2019. Post proceedure there was plus 1-2 residual MR. MR appears worse than echo done 03/02/19 but still in moderate range. Mean gradient in diastole slightly higher 5- 7.8 mmHg suggesting that clip still in  place. Anterior/Posterior grasp appears intact on 2 and 3 chamber views. . The mitral valve has been repaired/replaced. Moderate mitral valve regurgitation. No evidence of mitral stenosis.  5. Given degree of LV dysfunciton and valve morphology likely overall mild-moderate AS despte lower gradients . The aortic valve is tricuspid. Aortic valve regurgitation is not visualized. Mild to moderate aortic valve stenosis.   ASSESSMENT & PLAN:   Severe MR s/p MitraClip: echo 01/24/20 shows EF 20-25%, s/p single NTR clip to A2/P2 done on 01/2019. Post proceedure there was plus 1-2 residual MR. MR today appears worse than echo done 03/02/19 but still  in moderate range. Mean gradient in diastole slightly higher 5-7.8 mmHg suggesting that clip still in place. Anterior/Posterior grasp appears intact on 2 and 3 chamber views. Discussed  SBE prophylaxis Off all antiplatelet agents due to GI bleeding    Chronic combined S/D CHF: appears euvolemic. Continue lasix 40mg  daily and Toprol XL 25mg  daily. Losartan discontinued due to renal issues but peak Cr appears to have been only 1.56 ***  CKD stage III: creat has remained stable 1.28  Chronic anemia with hx of recent GI bleed: no longer on any antiplatelets.  Hct 40 04/30/20   Aortic stenosis: mild-moderate TTE  01/24/20  Medication Adjustments/Labs and Tests Ordered: Current medicines are reviewed at length with the patient today.  Concerns regarding medicines are outlined above.  Medication changes, Labs and Tests ordered today are listed in the Patient Instructions below. There are no Patient Instructions on file for this visit.   Signed, Jenkins Rouge, MD  07/12/2020 3:58 PM    [facility] [address], [place], [place]  [address] Phone: [phone]; Fax: [phone]

## 2020-07-15 ENCOUNTER — Other Ambulatory Visit: Payer: Self-pay

## 2020-07-15 ENCOUNTER — Telehealth: Payer: Medicare HMO | Admitting: Cardiovascular Disease

## 2020-07-23 ENCOUNTER — Telehealth: Payer: Self-pay

## 2020-07-23 NOTE — Telephone Encounter (Signed)
Patient's daughter calls nurse line regarding issues with sleep. Patient is currently taking melatonin 5 mg capsules every night, however, is not sleeping much during the evening.   Patient will "doze off" through out the day. Daughter reports patient drinking 3-12 oz bottles of Diet [facility] throughout the day. Recommended to daughter eliminating caffeine, especially in the later afternoon and evenings. Daughter verbalizes understanding.   Daughter is requesting further recommendations from Dr. Erin Hearing. Patient was instructed to follow up in June-July per PCP. Advised daughter that follow up appointment may be needed sooner to address new concern. Daughter prefers to wait for recommendation from provider before scheduling appointment.   Please advise.   Talbot Grumbling, RN

## 2020-07-24 NOTE — Telephone Encounter (Signed)
Try to use only caffeone free drinks after noon Try to prevent naps during the day  If not helping coem in for a visit

## 2020-07-29 NOTE — Telephone Encounter (Signed)
Patients daughter calls nurse line again in regards to sleep. Daughter reports they have tried everything and she is not sleeping through the night. "Everyone is exhausted." I have scheduled her an apt for 4/26 to discuss sleep, however in the meantime they would like to try Tylenol PM, but would like to ask PCP first. Please advise.

## 2020-07-30 NOTE — Telephone Encounter (Signed)
Doing better Gave one extra buspar which helped  Told ok to give up to 2 extra buspar as needed for increased anxiety  Will see them on 4/26

## 2020-08-13 ENCOUNTER — Other Ambulatory Visit: Payer: Self-pay

## 2020-08-13 ENCOUNTER — Ambulatory Visit (INDEPENDENT_AMBULATORY_CARE_PROVIDER_SITE_OTHER): Payer: Medicare HMO | Admitting: Family Medicine

## 2020-08-13 ENCOUNTER — Encounter: Payer: Self-pay | Admitting: Family Medicine

## 2020-08-13 VITALS — BP 102/62 | HR 118 | Wt 164.2 lb

## 2020-08-13 DIAGNOSIS — F5105 Insomnia due to other mental disorder: Secondary | ICD-10-CM

## 2020-08-13 DIAGNOSIS — D62 Acute posthemorrhagic anemia: Secondary | ICD-10-CM

## 2020-08-13 DIAGNOSIS — M549 Dorsalgia, unspecified: Secondary | ICD-10-CM | POA: Diagnosis not present

## 2020-08-13 DIAGNOSIS — F99 Mental disorder, not otherwise specified: Secondary | ICD-10-CM | POA: Diagnosis not present

## 2020-08-13 LAB — POCT URINALYSIS DIP (MANUAL ENTRY)
Glucose, UA: NEGATIVE mg/dL
Nitrite, UA: NEGATIVE
Protein Ur, POC: 100 mg/dL — AB
Spec Grav, UA: 1.025 (ref 1.010–1.025)
Urobilinogen, UA: 0.2 E.U./dL
pH, UA: 5.5 (ref 5.0–8.0)

## 2020-08-13 MED ORDER — TRAZODONE HCL 50 MG PO TABS: 25.0000 mg | ORAL_TABLET | Freq: Every evening | ORAL | 1 refills | Status: DC | PRN

## 2020-08-13 NOTE — Assessment & Plan Note (Signed)
Given history of recent fatigue will recheck cbc

## 2020-08-13 NOTE — Assessment & Plan Note (Signed)
Worsening recurrent problem.  Very concerning to her son and daughter she lives with.  They feels it is worsening her quality of life.  Discussed sleep hygiene approach (see after visit summary) she had been on trazodone in past and they will to try this again. We discussed and they would appreciate a geriatrics consult.

## 2020-08-13 NOTE — Progress Notes (Signed)
    SUBJECTIVE:   CHIEF COMPLAINT / HPI:   Brought in by her daughter  INSOMNIA Has been visiting different relatives recently and this seems to have worsened her chronic insomnia, disordered sleep.  Goes to bed around 9 and gets up for good around 9 but sleeps only for short periods with frequent awakening and wandering.  Naps on and off during the day.  The have tried melatonin and increased buspar but not helping.    Patient feels she sleeps well and cant recall any troubles  URINARY FREQUENCY This is chronic but perhaps more frequent lately.  Sometimes complains of back pain.  No fever or feeling ill   FATIGUE Daughter feels she is less active that a few months ago and seems to be more tired when walking an less interested in doing things. Had history of recurrent gi bleeds.  Currently off iron   PERTINENT  PMH / PSH: estranged from one daughter in [place] lives with other daughter and has son in [place]  OBJECTIVE:   BP 102/62   Pulse (!) 118   Wt 164 lb 3.2 oz (74.5 kg)   SpO2 94%   BMI 30.03 kg/m   Alert denies any discomfort Does not recall any recent urinary symptoms or back pain. Knows me and daughter but not place or time Heart - Regular rate and rhythm.  No murmurs, gallops or rubs.    Lungs:  Normal respiratory effort, chest expands symmetrically. Lungs are clear to auscultation, no crackles or wheezes.   Back - Normal skin, Spine with normal alignment and no deformity.  No tenderness to vertebral process palpation.  Paraspinous muscles are not tender and without spasm.   Range of motion is full at neck and lumbar sacral regions PHQ9 = 14 but endorse she feels well and happy when asked   ASSESSMENT/PLAN:   Anemia due to acute blood loss Given history of recent fatigue will recheck cbc   Insomnia Worsening recurrent problem.  Very concerning to her son and daughter she lives with.  They feels it is worsening her quality of life.  Discussed sleep hygiene approach (see  after visit summary) she had been on trazodone in past and they will to try this again. We discussed and they would appreciate a geriatrics consult.   URINARY FREQUENCY No clear uti symptoms and no evidence of pyleo. Family is concerned could be a UTI  Only obtained very small amount of urine today.  Will send for culture   [name], MD [place]

## 2020-08-13 NOTE — Patient Instructions (Signed)
Good to see you today - Thank you for coming in  Things we discussed today:  Consistent time to go to bed and to get up every day.  Usually about 8 hours Minimize or eliminate naps  Try Trazadone 1/2 tab about 30 minutes before bed  Please always bring your medication bottles  Try tylenol for back pain  We will try to get a urine culture and let you know  Make an appointment with Dr McDiarmid in our geriatric clinic   See me in 1-2 months

## 2020-08-14 LAB — CBC
Hematocrit: 36 % (ref 34.0–46.6)
Hemoglobin: 11.9 g/dL (ref 11.1–15.9)
MCH: 31.8 pg (ref 26.6–33.0)
MCHC: 33.1 g/dL (ref 31.5–35.7)
MCV: 96 fL (ref 79–97)
Platelets: 183 10*3/uL (ref 150–450)
RBC: 3.74 x10E6/uL — ABNORMAL LOW (ref 3.77–5.28)
RDW: 13 % (ref 11.7–15.4)
WBC: 6.2 10*3/uL (ref 3.4–10.8)

## 2020-08-15 ENCOUNTER — Telehealth: Payer: Self-pay

## 2020-08-15 ENCOUNTER — Encounter: Payer: Self-pay | Admitting: Family Medicine

## 2020-08-15 LAB — URINE CULTURE

## 2020-08-15 NOTE — Telephone Encounter (Signed)
Hannah [name] calls nurse line reporting Trazodone is not helping with sleep. Hannah [name] reports she gave her 1/2 tab the first night and whole tab last night without success. Hannah [name] reports the patient is waking up at all hours screaming in pain. Hannah [name] states, "noone is sleeping in the home and I am at my wits end." Hannah [name] is requesting something stronger than Tylenol for pain and requesting to up the Trazodone dosage. Will forward to PCP.

## 2020-08-16 NOTE — Telephone Encounter (Signed)
Daughter relates is intermittently complaining of severe back pain along with continuing agitation and confusion. No new symoptoms of fever or weakness or dysuria (Her urine culture was negative)  Trazadone 50 mg helped very little although did sleep at least 3 hours last night and daughter is trying to keep her awake during the day  Decided Insomnia Anxiety - Can increase trazadone to 75 mg tonight and if not effective can go up to max of 100 mg (2 tabs)  She understand this will increase her risks of falls  Back pain If the back pain continues to bother her they will take her to an UC or to our clinic for an urgent vist  Let her know our geriatrician only sees patients on thursdays.  Daughter is busy then.  She might seek other geriatricians   Asked her to let me know how things were going in a few days

## 2020-08-18 ENCOUNTER — Telehealth: Payer: Self-pay | Admitting: Family Medicine

## 2020-08-18 DIAGNOSIS — M5440 Lumbago with sciatica, unspecified side: Secondary | ICD-10-CM

## 2020-08-18 NOTE — Telephone Encounter (Signed)
Daughter reports patient complains of back pain.  Severe intermittent Spoke with patient who sounds her normal self but anxious about her back  Back pain has been intermittent severe then totally asymptomatic.  Normal exam last week with no signs of uti or stone.  Likely significant anxiety component  Tylenol not helping.  Suggested warm pack and can use 400 mg ibuprofen otc not more than every other day   If not helping asked daughter to take her to the ER or UC for further evaluation  Of note trazadone 75 mg helped "a lot" with sleep

## 2020-08-20 NOTE — Telephone Encounter (Signed)
Patients daughter calls nurse line reporting continued back pain. Hannah [name] is asking for a xray of patients back. Hannah [name] is also requesting a low dose of Gabapentin to see if this helps. Hannah [name] reports this has been discussed. Please let me know when xray order has been placed, if appropriate, so I can let Hannah [name] know.

## 2020-08-21 DIAGNOSIS — M549 Dorsalgia, unspecified: Secondary | ICD-10-CM | POA: Insufficient documentation

## 2020-08-21 NOTE — Telephone Encounter (Signed)
[name] returns call to nurse line to check status of previous message for back X-ray. Please advise.   Talbot Grumbling, RN

## 2020-08-21 NOTE — Telephone Encounter (Signed)
Daughter frustrated that Hannah [name] sometimes complains of severe back pain and then sometimes does not have any At time of call Hannah [name] denies any pain or recent pain Has a cough but not current shortness of breath or fever and sounds normal  Decided to get a back xray if pain persists If develops persistent shortness of breath or chest pain or fever should go to the ER or UC  Recommend seem me for an appointment in the next few weeks

## 2020-08-21 NOTE — Addendum Note (Signed)
Addended by: Talbert Cage L on: 08/21/2020 02:57 PM   Modules accepted: Orders

## 2020-08-23 ENCOUNTER — Other Ambulatory Visit: Payer: Self-pay

## 2020-08-23 ENCOUNTER — Ambulatory Visit (HOSPITAL_COMMUNITY)
Admission: RE | Admit: 2020-08-23 | Discharge: 2020-08-23 | Disposition: A | Discharge status: Home / Self Care | Payer: Medicare HMO | Source: Ambulatory Visit | Attending: Family Medicine | Admitting: Family Medicine

## 2020-08-23 DIAGNOSIS — M5440 Lumbago with sciatica, unspecified side: Secondary | ICD-10-CM | POA: Diagnosis not present

## 2020-08-23 DIAGNOSIS — M545 Low back pain, unspecified: Secondary | ICD-10-CM | POA: Diagnosis not present

## 2020-08-26 ENCOUNTER — Encounter: Payer: Self-pay | Admitting: Family Medicine

## 2020-08-28 ENCOUNTER — Telehealth: Payer: Self-pay | Admitting: Family Medicine

## 2020-08-28 ENCOUNTER — Ambulatory Visit: Payer: Medicare HMO | Admitting: Family Medicine

## 2020-08-28 NOTE — Telephone Encounter (Signed)
**  After Hours/ Emergency Line Call**  Received a call to report that Hannah [name] is having BLE swelling.  Daughter states that patient has dementia and was seen by Dr. Erin Hearing for insomnia 4/26 and started on Trazodone.  Has been taking daily, daughter thinks she is more delirious at night after starting this.  Daughter states that she has swelling of her BLE which she just noticed tonight.  No fevers.  She reports occasional difficulty breathing that is chronic. She was supposed to see Dr. Erin Hearing this AM, but refused to go.  No chest pain.  Daughter states that she thinks she urinates well, but doesn't know for sure.  She currently takes Lasix once a day.  Has not missed any doses.  She did have an episode of emesis this morning, so daughter isn't sure if Lasix came up with this.  Daughter states that patient is waking her up in the middle of the night yelling about her back pain, which is unchanged from previous talks with Dr. Erin Hearing.  Overall, daughter's biggest concern is the swelling in her legs.  Discussed possibility of giving an extra dose of lasix tonight, but with delirium, would be more concerned that patient would get up to urinate and fall.  Daughter agreed that she would like to hold off on this. Advised that patient should come to ED if difficulty breathing or chest pain occurs, daughter voiced understanding.  Appointment made in ATC tomorrow afternoon with Dr. Ouida Sills.  Will forward to PCP and Dr. Ouida Sills who will be seeing her tomorrow.  [place] Constable, DO PGY-3, [facility] 08/28/2020 11:28 PM

## 2020-08-29 ENCOUNTER — Other Ambulatory Visit: Payer: Self-pay

## 2020-08-29 ENCOUNTER — Ambulatory Visit: Payer: Medicare HMO

## 2020-08-29 ENCOUNTER — Encounter: Payer: Self-pay | Admitting: Family Medicine

## 2020-08-29 ENCOUNTER — Ambulatory Visit (INDEPENDENT_AMBULATORY_CARE_PROVIDER_SITE_OTHER): Payer: Medicare HMO | Admitting: Family Medicine

## 2020-08-29 ENCOUNTER — Telehealth: Payer: Self-pay

## 2020-08-29 VITALS — BP 134/64 | HR 97 | Wt 174.0 lb

## 2020-08-29 DIAGNOSIS — R600 Localized edema: Secondary | ICD-10-CM | POA: Diagnosis not present

## 2020-08-29 DIAGNOSIS — F0151 Vascular dementia with behavioral disturbance: Secondary | ICD-10-CM | POA: Diagnosis not present

## 2020-08-29 DIAGNOSIS — F01518 Vascular dementia, unspecified severity, with other behavioral disturbance: Secondary | ICD-10-CM

## 2020-08-29 DIAGNOSIS — M79674 Pain in right toe(s): Secondary | ICD-10-CM

## 2020-08-29 DIAGNOSIS — R443 Hallucinations, unspecified: Secondary | ICD-10-CM

## 2020-08-29 DIAGNOSIS — I5023 Acute on chronic systolic (congestive) heart failure: Secondary | ICD-10-CM

## 2020-08-29 DIAGNOSIS — I672 Cerebral atherosclerosis: Secondary | ICD-10-CM | POA: Diagnosis not present

## 2020-08-29 DIAGNOSIS — L03031 Cellulitis of right toe: Secondary | ICD-10-CM

## 2020-08-29 DIAGNOSIS — L0390 Cellulitis, unspecified: Secondary | ICD-10-CM | POA: Insufficient documentation

## 2020-08-29 MED ORDER — CEPHALEXIN 500 MG PO CAPS: 500.0000 mg | ORAL_CAPSULE | Freq: Four times a day (QID) | ORAL | 0 refills | Status: DC

## 2020-08-29 MED ORDER — ARIPIPRAZOLE 5 MG PO TABS: 5.0000 mg | ORAL_TABLET | Freq: Every day | ORAL | 0 refills | Status: DC

## 2020-08-29 NOTE — Progress Notes (Signed)
SUBJECTIVE:   CHIEF COMPLAINT / HPI:   Lower Leg Edema Worsening lower leg edema. She is on Lasix and reports not missing any doses. She usually does not have edema. She reports 10 lb weight increase since her most recent appointment on 4/26. Patient denies chest pain, difficulty breathing.  Hallucinations Patient has had hallucinations for the last two nights. She is seeing people and babies that are not there and talking with them. This concerned the daughter. The daughter notes that she stopped giving her her nightly Trazodone because she was concerned that may have been the cause for the hallucinations.   Toe Concern Daughter states that patient has a red toe "with pockets of pus". She is concerned about what this could be. She does not normally look at patients feet so she is uncertain how long it has been present but she first noticed it today. Patient does have a history of gout. She was previously prescribed colchicine but reports being unable to afford it.   Taking 1 BusPar in morning and two at night.   PERTINENT  PMH / PSH:  Past Medical History:  Diagnosis Date  . Anemia   . Anemia of chronic disease   . Anxiety   . Arthritis    "hands" (05/09/2015)  . Chronic diastolic heart failure ([place])   . CKD (chronic kidney disease) stage 3, GFR 30-59 ml/min (HCC)   . Dementia ([place])   . Elevated TSH 02/03/2018  . GERD (gastroesophageal reflux disease)   . HLD (hyperlipidemia)   . Hypertension   . LBBB (left bundle branch block)   . Mitral valve insufficiency   . Nonrheumatic mitral valve regurgitation 02/02/2019  . Osteoarthritis   . 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 S/P mitral valve repair    s/p MitraClip on 02/03/19  . Vitamin B12 deficiency 12/28/2018     OBJECTIVE:   BP 134/64   Pulse 97   Wt 174 lb (78.9 kg)   SpO2 98%   BMI 31.83 kg/m    General: Elderly patient sitting in wheelchair, in no acute distress, oriented to person, place, not time (stated 1916), and  uncertain about situation, pleasant Cardiac: RRR, no obvious JVD  Respiratory: CTAB, normal effort, No wheezes, rales or rhonchi Extremities: 2+ pitting edema b/l from feet to knee. 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 Discussed taking Trazodone PRN for sleep. On Buspar but daughter does not believe this is helping. Will add Abilify daily and change Buspar to PRN for anxiety.   CHF (congestive heart failure) ([place]) With EF 20-25% on most recent echo 01/2020. Has been taking Lasix 40 mg daily without any missed doses. Exam today notable for b/l lower extremity edema and 10 lb weight gain since visit on 4/26. Uncertain of patients dry weight but would estimate around 164 lb on weight review, she is 174 lb today. Lungs clear without evidence of crackles. Sp02 98% on room air and vitals stable. Likely exacerbation of CHF. She does have history of anemia, will evaluate for this as etiology as well but Hgb was stable last visit and no evidence of active bleed so less likely.  -Increase Lasix to 80 mg  -BMP to assess electrolyte and kidney function -CBC to check  for anemia -F/u with PCP  Cellulitis Patient with warm, erythematous and mildly tender 4th right toe. Unknown duration. Question whether this is gout with tophi but would expect more tenderness with gout. Patient has been ambulating without difficulty and only has mild and intermittent pain on palpation. Previously prescribed colchicine but due to financial barriers did not have it filled. Daughter states that she may be able to get it now. Advised to take daily. Will also cover for potential infection and treat as cellulitis.  -Take colchicine  -Obtain uric acid -Keflex  x5 days  -Return for follow up with PCP     Sharion Settler, [place]

## 2020-08-29 NOTE — Telephone Encounter (Signed)
Disregard opened in error was able to reschedule pt in for a later apt.

## 2020-08-29 NOTE — Patient Instructions (Addendum)
It was wonderful to see you today.  Please bring ALL of your medications with you to every visit.   Today we talked about:  -Increase the Lasix to 80 mg every morning. Elevate your feet.   -We started you on a new medication to help control anxiety. It is called Abilify. You can go down on your Buspar, take it as needed. Take Trazodone as needed.   -We are starting antibiotics to treat a possible toe infection. She will need to complete 5-day course.  -Schedule a follow up with Dr. Erin Hearing in 2-weeks.   Thank you for choosing [facility].   Please call [phone] with any questions about today's appointment.  Please be sure to schedule follow up at the front  desk before you leave today.   Sharion Settler, DO PGY-1 Family Medicine

## 2020-08-29 NOTE — Assessment & Plan Note (Signed)
Hallucinations likely a result of worsening dementia and less likely side effect of medication. Discussed taking Trazodone PRN for sleep. On Buspar but daughter does not believe this is helping. Will add Abilify daily and change Buspar to PRN for anxiety.

## 2020-08-29 NOTE — Assessment & Plan Note (Addendum)
With EF 20-25% on most recent echo 01/2020. Has been taking Lasix 40 mg daily without any missed doses. Exam today notable for b/l lower extremity edema and 10 lb weight gain since visit on 4/26. Uncertain of patients dry weight but would estimate around 164 lb on weight review, she is 174 lb today. Lungs clear without evidence of crackles. Sp02 98% on room air and vitals stable. Likely exacerbation of CHF. She does have history of anemia, will evaluate for this as etiology as well but Hgb was stable last visit and no evidence of active bleed so less likely.  -Increase Lasix to 80 mg  -BMP to assess electrolyte and kidney function -CBC to check for anemia -F/u with PCP

## 2020-08-29 NOTE — Assessment & Plan Note (Addendum)
Patient with warm, erythematous and mildly tender 4th right toe. Unknown duration. Question whether this is gout with tophi but would expect more tenderness with gout. Patient has been ambulating without difficulty and only has mild and intermittent pain on palpation. Previously prescribed colchicine but due to financial barriers did not have it filled. Daughter states that she may be able to get it now. Advised to take daily. Will also cover for potential infection and treat as cellulitis.  -Take colchicine  -Obtain uric acid -Keflex x5 days  -Return for follow up with PCP

## 2020-08-30 ENCOUNTER — Telehealth: Payer: Self-pay

## 2020-08-30 ENCOUNTER — Other Ambulatory Visit: Payer: Self-pay | Admitting: Family Medicine

## 2020-08-30 ENCOUNTER — Inpatient Hospital Stay (HOSPITAL_COMMUNITY)
Admission: AD | Admit: 2020-08-30 | Discharge: 2020-09-11 | DRG: 291 | Disposition: A | Discharge status: Hospice | Payer: Medicare HMO | Source: Ambulatory Visit | Attending: Family Medicine | Admitting: Family Medicine

## 2020-08-30 DIAGNOSIS — R600 Localized edema: Secondary | ICD-10-CM

## 2020-08-30 DIAGNOSIS — K219 Gastro-esophageal reflux disease without esophagitis: Secondary | ICD-10-CM | POA: Diagnosis present

## 2020-08-30 DIAGNOSIS — Z20822 Contact with and (suspected) exposure to covid-19: Secondary | ICD-10-CM | POA: Diagnosis not present

## 2020-08-30 DIAGNOSIS — Z872 Personal history of diseases of the skin and subcutaneous tissue: Secondary | ICD-10-CM | POA: Diagnosis not present

## 2020-08-30 DIAGNOSIS — L03031 Cellulitis of right toe: Secondary | ICD-10-CM | POA: Diagnosis present

## 2020-08-30 DIAGNOSIS — F0281 Dementia in other diseases classified elsewhere with behavioral disturbance: Secondary | ICD-10-CM | POA: Diagnosis not present

## 2020-08-30 DIAGNOSIS — N179 Acute kidney failure, unspecified: Secondary | ICD-10-CM | POA: Diagnosis present

## 2020-08-30 DIAGNOSIS — N1832 Chronic kidney disease, stage 3b: Secondary | ICD-10-CM | POA: Diagnosis present

## 2020-08-30 DIAGNOSIS — J90 Pleural effusion, not elsewhere classified: Secondary | ICD-10-CM | POA: Diagnosis not present

## 2020-08-30 DIAGNOSIS — K5900 Constipation, unspecified: Secondary | ICD-10-CM | POA: Diagnosis present

## 2020-08-30 DIAGNOSIS — F419 Anxiety disorder, unspecified: Secondary | ICD-10-CM | POA: Diagnosis present

## 2020-08-30 DIAGNOSIS — F39 Unspecified mood [affective] disorder: Secondary | ICD-10-CM | POA: Diagnosis not present

## 2020-08-30 DIAGNOSIS — F05 Delirium due to known physiological condition: Secondary | ICD-10-CM | POA: Diagnosis not present

## 2020-08-30 DIAGNOSIS — B372 Candidiasis of skin and nail: Secondary | ICD-10-CM | POA: Diagnosis present

## 2020-08-30 DIAGNOSIS — I509 Heart failure, unspecified: Secondary | ICD-10-CM

## 2020-08-30 DIAGNOSIS — R188 Other ascites: Secondary | ICD-10-CM | POA: Diagnosis not present

## 2020-08-30 DIAGNOSIS — I350 Nonrheumatic aortic (valve) stenosis: Secondary | ICD-10-CM | POA: Diagnosis present

## 2020-08-30 DIAGNOSIS — S40862A Insect bite (nonvenomous) of left upper arm, initial encounter: Secondary | ICD-10-CM | POA: Diagnosis present

## 2020-08-30 DIAGNOSIS — Z515 Encounter for palliative care: Secondary | ICD-10-CM | POA: Diagnosis not present

## 2020-08-30 DIAGNOSIS — I340 Nonrheumatic mitral (valve) insufficiency: Secondary | ICD-10-CM | POA: Diagnosis not present

## 2020-08-30 DIAGNOSIS — F0151 Vascular dementia with behavioral disturbance: Secondary | ICD-10-CM | POA: Diagnosis present

## 2020-08-30 DIAGNOSIS — R410 Disorientation, unspecified: Secondary | ICD-10-CM | POA: Diagnosis not present

## 2020-08-30 DIAGNOSIS — E785 Hyperlipidemia, unspecified: Secondary | ICD-10-CM | POA: Diagnosis present

## 2020-08-30 DIAGNOSIS — G4700 Insomnia, unspecified: Secondary | ICD-10-CM | POA: Diagnosis present

## 2020-08-30 DIAGNOSIS — I5020 Unspecified systolic (congestive) heart failure: Secondary | ICD-10-CM | POA: Diagnosis not present

## 2020-08-30 DIAGNOSIS — I248 Other forms of acute ischemic heart disease: Secondary | ICD-10-CM | POA: Diagnosis not present

## 2020-08-30 DIAGNOSIS — Z7189 Other specified counseling: Secondary | ICD-10-CM | POA: Diagnosis not present

## 2020-08-30 DIAGNOSIS — R7989 Other specified abnormal findings of blood chemistry: Secondary | ICD-10-CM

## 2020-08-30 DIAGNOSIS — I130 Hypertensive heart and chronic kidney disease with heart failure and stage 1 through stage 4 chronic kidney disease, or unspecified chronic kidney disease: Secondary | ICD-10-CM | POA: Diagnosis not present

## 2020-08-30 DIAGNOSIS — Z87891 Personal history of nicotine dependence: Secondary | ICD-10-CM | POA: Diagnosis not present

## 2020-08-30 DIAGNOSIS — R531 Weakness: Secondary | ICD-10-CM

## 2020-08-30 DIAGNOSIS — I517 Cardiomegaly: Secondary | ICD-10-CM | POA: Diagnosis not present

## 2020-08-30 DIAGNOSIS — M7989 Other specified soft tissue disorders: Secondary | ICD-10-CM | POA: Diagnosis not present

## 2020-08-30 DIAGNOSIS — I5043 Acute on chronic combined systolic (congestive) and diastolic (congestive) heart failure: Secondary | ICD-10-CM | POA: Diagnosis not present

## 2020-08-30 DIAGNOSIS — R064 Hyperventilation: Secondary | ICD-10-CM | POA: Diagnosis present

## 2020-08-30 DIAGNOSIS — F5101 Primary insomnia: Secondary | ICD-10-CM | POA: Diagnosis not present

## 2020-08-30 DIAGNOSIS — E038 Other specified hypothyroidism: Secondary | ICD-10-CM | POA: Diagnosis present

## 2020-08-30 DIAGNOSIS — N289 Disorder of kidney and ureter, unspecified: Secondary | ICD-10-CM | POA: Diagnosis not present

## 2020-08-30 DIAGNOSIS — F0390 Unspecified dementia without behavioral disturbance: Secondary | ICD-10-CM | POA: Diagnosis not present

## 2020-08-30 DIAGNOSIS — D631 Anemia in chronic kidney disease: Secondary | ICD-10-CM | POA: Diagnosis present

## 2020-08-30 DIAGNOSIS — N2889 Other specified disorders of kidney and ureter: Secondary | ICD-10-CM | POA: Diagnosis not present

## 2020-08-30 DIAGNOSIS — Z79899 Other long term (current) drug therapy: Secondary | ICD-10-CM

## 2020-08-30 DIAGNOSIS — R443 Hallucinations, unspecified: Secondary | ICD-10-CM | POA: Diagnosis not present

## 2020-08-30 DIAGNOSIS — I5021 Acute systolic (congestive) heart failure: Secondary | ICD-10-CM | POA: Diagnosis not present

## 2020-08-30 DIAGNOSIS — N183 Chronic kidney disease, stage 3 unspecified: Secondary | ICD-10-CM

## 2020-08-30 DIAGNOSIS — N189 Chronic kidney disease, unspecified: Secondary | ICD-10-CM | POA: Diagnosis not present

## 2020-08-30 DIAGNOSIS — I5023 Acute on chronic systolic (congestive) heart failure: Secondary | ICD-10-CM | POA: Diagnosis not present

## 2020-08-30 DIAGNOSIS — Z66 Do not resuscitate: Secondary | ICD-10-CM | POA: Diagnosis not present

## 2020-08-30 DIAGNOSIS — M869 Osteomyelitis, unspecified: Secondary | ICD-10-CM | POA: Diagnosis not present

## 2020-08-30 DIAGNOSIS — R52 Pain, unspecified: Secondary | ICD-10-CM | POA: Diagnosis not present

## 2020-08-30 DIAGNOSIS — E8779 Other fluid overload: Secondary | ICD-10-CM | POA: Diagnosis not present

## 2020-08-30 DIAGNOSIS — R911 Solitary pulmonary nodule: Secondary | ICD-10-CM | POA: Diagnosis present

## 2020-08-30 DIAGNOSIS — E119 Type 2 diabetes mellitus without complications: Secondary | ICD-10-CM | POA: Diagnosis not present

## 2020-08-30 DIAGNOSIS — E538 Deficiency of other specified B group vitamins: Secondary | ICD-10-CM | POA: Diagnosis present

## 2020-08-30 DIAGNOSIS — E669 Obesity, unspecified: Secondary | ICD-10-CM | POA: Diagnosis present

## 2020-08-30 DIAGNOSIS — W57XXXA Bitten or stung by nonvenomous insect and other nonvenomous arthropods, initial encounter: Secondary | ICD-10-CM | POA: Diagnosis not present

## 2020-08-30 DIAGNOSIS — M86171 Other acute osteomyelitis, right ankle and foot: Secondary | ICD-10-CM | POA: Diagnosis not present

## 2020-08-30 DIAGNOSIS — G8929 Other chronic pain: Secondary | ICD-10-CM | POA: Diagnosis present

## 2020-08-30 DIAGNOSIS — Z6831 Body mass index (BMI) 31.0-31.9, adult: Secondary | ICD-10-CM

## 2020-08-30 DIAGNOSIS — E876 Hypokalemia: Secondary | ICD-10-CM | POA: Diagnosis not present

## 2020-08-30 DIAGNOSIS — I5042 Chronic combined systolic (congestive) and diastolic (congestive) heart failure: Secondary | ICD-10-CM

## 2020-08-30 LAB — BASIC METABOLIC PANEL
BUN/Creatinine Ratio: 28 (ref 12–28)
BUN: 66 mg/dL — ABNORMAL HIGH (ref 8–27)
CO2: 23 mmol/L (ref 20–29)
Calcium: 9.5 mg/dL (ref 8.7–10.3)
Chloride: 95 mmol/L — ABNORMAL LOW (ref 96–106)
Creatinine, Ser: 2.33 mg/dL — ABNORMAL HIGH (ref 0.57–1.00)
Glucose: 158 mg/dL — ABNORMAL HIGH (ref 65–99)
Potassium: 4.3 mmol/L (ref 3.5–5.2)
Sodium: 138 mmol/L (ref 134–144)
eGFR: 21 mL/min/{1.73_m2} — ABNORMAL LOW (ref >59)

## 2020-08-30 LAB — CBC
Hematocrit: 40.1 % (ref 34.0–46.6)
Hemoglobin: 12.7 g/dL (ref 11.1–15.9)
MCH: 31.4 pg (ref 26.6–33.0)
MCHC: 31.7 g/dL (ref 31.5–35.7)
MCV: 99 fL — ABNORMAL HIGH (ref 79–97)
Platelets: 191 10*3/uL (ref 150–450)
RBC: 4.05 x10E6/uL (ref 3.77–5.28)
RDW: 13.3 % (ref 11.7–15.4)
WBC: 6.9 10*3/uL (ref 3.4–10.8)

## 2020-08-30 LAB — URIC ACID: Uric Acid: 14.5 mg/dL — ABNORMAL HIGH (ref 3.1–7.9)

## 2020-08-30 MED ORDER — ACETAMINOPHEN 325 MG PO TABS
650.0000 mg | ORAL_TABLET | ORAL | Status: DC | PRN
  Administered 2020-08-31 – 2020-09-01 (×4): 650 mg via ORAL
  Filled 2020-08-30 (×4): qty 2

## 2020-08-30 MED ORDER — SODIUM CHLORIDE 0.9 % IV SOLN: 250.0000 mL | INTRAVENOUS | Status: DC | PRN

## 2020-08-30 MED ORDER — ONDANSETRON HCL 4 MG/2ML IJ SOLN: 4.0000 mg | Freq: Four times a day (QID) | INTRAMUSCULAR | Status: DC | PRN

## 2020-08-30 MED ORDER — SODIUM CHLORIDE 0.9% FLUSH: 3.0000 mL | INTRAVENOUS | Status: DC | PRN

## 2020-08-30 MED ORDER — SODIUM CHLORIDE 0.9% FLUSH
3.0000 mL | Freq: Two times a day (BID) | INTRAVENOUS | Status: DC
  Administered 2020-08-31 – 2020-09-06 (×8): 3 mL via INTRAVENOUS

## 2020-08-30 NOTE — Telephone Encounter (Signed)
Received phone call from patient's daughter regarding issues picking up Abilify. Medication needs PA. PA submitted through covermymeds. Response can take up to 72 hours.   Cover My Meds info: Key: [id number]   Talbot Grumbling, RN

## 2020-08-30 NOTE — H&P (Addendum)
[facility] Admission History and Physical Service Pager: [phone]  [name] Medical record number: [id number] Date of birth: 10/21/1945 Age: 75 y.o. Gender: female  Primary Care Provider: [name], MD Consultants: None Code Status: Full which was confirmed with patient and daughter Preferred Emergency Contact: Daughter, Hannah [name] [phone]  Chief Complaint: leg swelling and right 4th toe redness  Assessment and Plan: Hannah [name] is a 75 y.o. female presenting with BLE edema, AKI, right 4th toe redness. PMH is significant for chronic HFrEF (last Echo 01/24/20- EF 20-25%), HTN, Dementia, GI bleed s/p mesenteric artery coil embolization, anxiety, severe mitral regurgitation s/p MitraClip (02/02/19), symptomatic anemia, insomnia  BLE Edema Likely 2/2 CHF exacerbation Presents with bilateral lower extremity pitting edema first noticed by daughter on 5/11.  No chest pain or shortness of breath.  Seen in PCPs office on 5/12, noted to have pitting edema of her bilateral lower extremities and a 10 pound weight increase from appointment on 4/26.  Lasix was increased from 40 mg to 80 mg.  BMP obtained in office that showed elevated creatinine to 2.33, suggestive of AKI.  Given this, and in the setting of patient's dementia with reported nighttime delirium, and fluid overload, decision was made to direct admit patient to the hospital.  On presentation to the hospital, vitals are stable, breathing comfortably on room air.  She does have 2+ pitting edema to her knees bilaterally.  Lungs are clear on exam.  Last echo 01/2020 with EF of 20 to 25%, s/p single NTR clip with moderate residual mitral regurg, mild to moderate aortic stenosis.  Most likely cause of her symptoms is CHF exacerbation, will obtain labs, go ahead and give IV Lasix 40x1 and continue to monitor. --Admit to Hannah [name], attending Dr. Thompson [name] --Vitals per floor --Cardiac  monitoring --Pulse ox --Strict I's and O's --Daily weights --BMP, CBC in am --PT/OT eval and treat --Lasix 40 IV x1, re eval in AM --BMP, CBC, BNP, Mag now --CXR now -- EKG reviewed: no acute changes -- repeat Echo ordered -- consider cards consult if not having improvement  Cellulitis I ? Gout Patient presented to office visit yesterday with warm, erythematous, mildly tender R 4th toe of unknown duration. Was prescribed Keflex and Colchicine, but has not started. Uric acid elevated to 14.5 on outpatient labs.  CrCl 25, will need to renally adjust.  On exam, her toe does appear improved from images 2 days ago, daughter has only been performed Epsom soaks.  No fevers or systemic symptoms.  Will cover for cellulitis with keflex, will hold off on Gout treatment for now as this is less likely cause of her symptoms.  She has not been treated previously with colchicine due to cost and has never been on allopurinol.   Home meds: Cephalexin 500 mg 4 times daily, Colchicine 0.6 mg  - keflex 500mg  q8h (5/14- ) - consider starting colchicine if no improvement with cellulitis treatment  HFrEF s/p mitral valve repair (Mitra Clip) 02/03/19 Residual Moderate MR Mild-Moderate Aortic Stenosis Patient's most recent echo on 01/24/20 shows EF of 20-25%, with moderate mitral valve regurgitation and suggested mitral clip still in place.  ASA and Plavix d/c'ed previously for GI bleed.  Losartan held in prior admission for AKI and not restarted.  On Toprol XL 50mg  QD, Lasix 40mg , but increased to 80 on 5/12.   - continue home metoprolol - rest of plan per above  AKI on CKD Stage IIIB Baseline Cr ~  1.3-1.5.  Cr 2.33 on 5/12 at outpatient appointment.  Likely in setting of fluid overload. - diuresis per above - monitor BMP  HTN Bp 97/63 on admission.  Home meds; Tprol XL 50mg . - cont home meds - moniotr BP closely with diuresis  HLD Home meds: Lipitor 80 mg - cont home lipitor  Dementia I  Hallucinations Daughter reports that since started trazodone for insomnia on 4/26, patient has been having worsening hallucinations at night.  Daughter reports that she has been talking to people who are not there and been up walking around most of the night.  No noted [name].  Trazodone was stopped on 5/12.  Daughter has reported a decrease in hallucinations with stopping this, but still continues to have some.  Has also been yelling out at night with back pain, but XR without significant cause, PCP notes may be related to her dementia and not actually back pain.  Daughter also reports that a few times when she was yelling and hallucinating at night, she thought her speech sounded garbled, but states that she was otherwise normal appear, had normal gait, and no facial droop.  Daughter was curious if she could have been having TIAs, but is unsure yet if they would want to pursue this workup further.  Discussed with daughter and she is open to [place] conversation with palliative care.  On admission, patient is A&Ox2, to person and place, but not time or circumstance.  Home meds: Abilify 5 mg Q day, sordered on 5.12, but not yet started - start abilify 5mg  QD  - delirium precautions - consult palliative for [place] discussion - consider MRI pending palliative discussion  Anxiety Home meds: Buspar 5 mg BID PRN - can add on if needed  Insomnia Likely related to dementia.  Home meds: Melatonin 10 mg QHS, Trazodone 50 mg (discontinued due to concerns of hallucinations) - continue melatonin   GERD Home meds: Protonix 40 mg - continue home meds  Osteoarthritis Lumbar spine x-ray on 08/26/2020 shows multilevel disc osteophytic disease with no acute fndings and no significant changes from CT 2020. Home meds: Tylenol 325 mg Q6h PRN - tylenol prn  Hx of symptomatic anemia I Hx GI Bleed No longer on any antiplatelets.   S/p mesenteric artery coil embolization.  Hgb was stable o 5/12.  Okay to start VTE PPx, but  would monitor closely Home meds: Ferrous sulphate 324 mg Q day, but reports not taking - monitor CBC closely while on VTE PPx  Incidental pulmonary nodule Seen on CTA 09/2019.   - repeat outpatient June 2022  Candidal Intertrigo Noted on admission exam under breasts and in groin - nystatin ointment BID   FEN/GI: Heart Healthy Prophylaxis: Lovenox adjusted for CrCl <30  Disposition: place in observation, medical telemetry  History of Present Illness:  Hannah [name] is a 75 y.o. female presenting with lower extremity edema since 5/11.    Patient's daughter reports she was getting patient in the shower when she noticed that her legs had increased in size.  She also noticed that her right fourth toe had redness and a few blisters on it.  At that time, patient was not complaining of any shortness of breath or chest pain.  She was seen the following day at PCPs office and labs were drawn.  These showed an AKI, and in the setting of a suspected CHF exacerbation, decision was made to direct admit patient to the hospital.  She was diagnosed with cellulitis of her right  fourth toe on 511 as well, but daughter was not able to pick up antibiotics until today.  She was also found to have have an elevated uric acid level.  Similarly, she was not able to pick up colchicine until today, and did not start this.  Her daughter reports that she has not had any fevers, no other signs of illness.  She states that she occasionally complains of stomach pain, after stooling, daughter will go into the bathroom and will only note a small few hard balls of stool.  No melena or hematochezia.  She has been tolerating a diet normally.  On 4/26, patient was seen by her PCP as well for back pain and insomnia.  She had an x-ray of her back, and was noted to have only osteoarthritis.  The daughter had been concerned because the patient was yelling out in pain at night.  She does have an underlying diagnosis of dementia and  PCP noted that this may be contributing to her yelling.  She was started on trazodone for insomnia on 4/26.  Daughter reports that since that time she has been very delirious and having hallucinations at night, talking to people who are not there.  She stopped this medication on 5/12 and has noticed a slight decrease in the hallucinations, but reports that she is still having some.  She reports that this makes it very difficult for her to care for her.  Ultimately, the daughter is her caregiver and would like to continue to keep her at home, but is hoping for some slight improvement during this hospitalization before this can happen.  Interviewed patient as well, but with dementia, mostly short-term memory affected, she is not able to give much history.  She does note that she has some swelling in her legs.  She denies shortness of breath or chest pain.  She denies using mobility aids at home.  She denies any pain in her toes.  She is unable to state why she is in the hospital.  Review Of Systems: Per HPI with the following additions:   Review of Systems  Constitutional: Negative for appetite change, fatigue and fever.  HENT: Negative for congestion.   Eyes: Negative for visual disturbance.  Respiratory: Negative for cough and shortness of breath.   Cardiovascular: Positive for leg swelling. Negative for chest pain.  Gastrointestinal: Positive for constipation. Negative for abdominal pain, diarrhea, nausea and vomiting.  Genitourinary: Negative for decreased urine volume, difficulty urinating and dysuria.       Patient declines, daughter is unsure about urinations  Musculoskeletal: Positive for back pain.  Skin: Positive for color change (red right 4th toe) and rash (under breasts and in groin).  Neurological: Negative for dizziness, facial asymmetry, weakness and headaches.  Psychiatric/Behavioral: Positive for hallucinations and sleep disturbance.     Patient Active Problem List   Diagnosis  Date Noted  . CHF exacerbation ([place]) 08/30/2020  . Cellulitis 08/29/2020  . Back pain 08/21/2020  . Acute exacerbation of CHF (congestive heart failure) ([place]) 10/02/2019  . Anemia due to acute blood loss 03/09/2019  . HLD (hyperlipidemia)   . Insomnia 03/01/2019  . Adverse effect of antiplatelet agent 02/22/2019  . S/P mitral valve repair   . CKD (chronic kidney disease) stage 3, GFR 30-59 ml/min (HCC)   . Vitamin B12 deficiency 12/28/2018  . Chronic combined systolic and diastolic congestive heart failure ([place])   . Dementia due to atherosclerosis with behavioral disturbance ([place]) 02/02/2018  . Gout 09/11/2015  .  Essential hypertension 08/09/2008  . GERD 08/09/2008    Past Medical History: Past Medical History:  Diagnosis Date  . Anemia   . Anemia of chronic disease   . Anxiety   . Arthritis    "hands" (05/09/2015)  . Chronic diastolic heart failure ([place])   . CKD (chronic kidney disease) stage 3, GFR 30-59 ml/min (HCC)   . Dementia ([place])   . Elevated TSH 02/03/2018  . GERD (gastroesophageal reflux disease)   . HLD (hyperlipidemia)   . Hypertension   . LBBB (left bundle branch block)   . Mitral valve insufficiency   . Nonrheumatic mitral valve regurgitation 02/02/2019  . Osteoarthritis   . 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 S/P mitral valve repair    s/p MitraClip on 02/03/19  . Vitamin B12 deficiency 12/28/2018    Past Surgical History: Past Surgical History:  Procedure Laterality Date  . BIOPSY  10/28/2018   Procedure: BIOPSY;  Surgeon: Juanita Craver, MD;  Location: [facility] ENDOSCOPY;  Service: Endoscopy;;  . CARDIAC CATHETERIZATION  05/2004  . COLONOSCOPY  07/2002   Archie Endo 09/02/2010  . COLONOSCOPY WITH PROPOFOL N/A 10/28/2018   Procedure: COLONOSCOPY WITH PROPOFOL;  Surgeon: Juanita Craver, MD;  Location: [facility] ENDOSCOPY;  Service: Endoscopy;  Laterality: N/A;  . ENTEROSCOPY N/A 02/24/2019   Procedure: ENTEROSCOPY;  Surgeon: Carol Ada, MD;  Location: [place];  Service:  Endoscopy;  Laterality: N/A;  . ENTEROSCOPY N/A 05/23/2019   Procedure: ENTEROSCOPY;  Surgeon: Carol Ada, MD;  Location: [facility] ENDOSCOPY;  Service: Endoscopy;  Laterality: N/A;  . ESOPHAGOGASTRODUODENOSCOPY  07/2002   w/biopsies/notes 09/02/2010  . ESOPHAGOGASTRODUODENOSCOPY (EGD) WITH PROPOFOL N/A 10/28/2018   Procedure: ESOPHAGOGASTRODUODENOSCOPY (EGD) WITH PROPOFOL;  Surgeon: Juanita Craver, MD;  Location: [facility] ENDOSCOPY;  Service: Endoscopy;  Laterality: N/A;  . GIVENS CAPSULE STUDY N/A 02/23/2019   Procedure: GIVENS CAPSULE STUDY;  Surgeon: Carol Ada, MD;  Location: [place];  Service: Endoscopy;  Laterality: N/A;  . HOT HEMOSTASIS N/A 10/28/2018   Procedure: HOT HEMOSTASIS (ARGON PLASMA COAGULATION/BICAP);  Surgeon: Juanita Craver, MD;  Location[facility] ENDOSCOPY;  Service: Endoscopy;  Laterality: N/A;  . HOT HEMOSTASIS N/A 02/24/2019   Procedure: HOT HEMOSTASIS (ARGON PLASMA COAGULATION/BICAP);  Surgeon: Carol Ada, MD;  Location: [place];  Service: Endoscopy;  Laterality: N/A;  . HOT HEMOSTASIS N/A 05/23/2019   Procedure: HOT HEMOSTASIS (ARGON PLASMA COAGULATION/BICAP);  Surgeon: Carol Ada, MD;  Location: [place];  Service: Endoscopy;  Laterality: N/A;  . IR ANGIOGRAM SELECTIVE EACH ADDITIONAL VESSEL  10/30/2018  . IR ANGIOGRAM SELECTIVE EACH ADDITIONAL VESSEL  10/30/2018  . IR ANGIOGRAM SELECTIVE EACH ADDITIONAL VESSEL  10/30/2018  . IR ANGIOGRAM SELECTIVE EACH ADDITIONAL VESSEL  10/30/2018  . IR ANGIOGRAM VISCERAL SELECTIVE  10/30/2018  . IR EMBO ART  VEN HEMORR LYMPH EXTRAV  INC GUIDE ROADMAPPING  10/30/2018  . IR US GUIDANCE  10/30/2018  . MITRAL VALVE REPAIR N/A 02/02/2019   Procedure: MITRAL VALVE REPAIR;  Surgeon: Sherren Mocha, MD;  Location: [place] CV LAB;  Service: Cardiovascular;  Laterality: N/A;  . POLYPECTOMY  10/28/2018   Procedure: POLYPECTOMY;  Surgeon: Juanita Craver, MD;  Location: [facility] ENDOSCOPY;  Service: Endoscopy;;  . RIGHT/LEFT HEART CATH AND CORONARY  ANGIOGRAPHY N/A 12/29/2018   Procedure: RIGHT/LEFT HEART CATH AND CORONARY ANGIOGRAPHY;  Surgeon: Leonie Man, MD;  Location: [place] CV LAB;  Service: Cardiovascular;  Laterality: N/A;  . TEE WITHOUT CARDIOVERSION N/A 12/30/2018   Procedure: TRANSESOPHAGEAL ECHOCARDIOGRAM (TEE);  Surgeon: Lelon Perla, MD;  Location: [facility] ENDOSCOPY;  Service: Cardiovascular;  Laterality: N/A;    Social History: Social History   Tobacco Use  . Smoking status: Former Smoker    Packs/day: 0.30    Years: 8.00    Pack years: 2.40    Types: Cigarettes    Quit date: 05/30/2004    Years since quitting: 16.2  . Smokeless tobacco: Never Used  Vaping Use  . Vaping Use: Never used  Substance Use Topics  . Alcohol use: No    Alcohol/week: 0.0 standard drinks  . Drug use: Never   Please also refer to relevant sections of EMR.  Family History: Family History  Problem Relation Age of Onset  . Heart disease Mother        died at age 91  . Breast cancer Sister   . Cancer Sister        Breast  . Hypertension Father   . Hypertension Sister     Allergies and Medications: Allergies  Allergen Reactions  . 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   Current Outpatient Medications on File Prior to Encounter  Medication Sig Dispense Refill  . acetaminophen (TYLENOL) 325 MG tablet Take 650 mg by mouth every 6 (six) hours as needed for mild pain or headache.    . ARIPiprazole (ABILIFY) 5 MG tablet Take 1 tablet (5 mg total) by mouth daily. 30 tablet 0  . atorvastatin (LIPITOR) 80 MG tablet Take 1 tablet (80 mg total) by mouth daily. 90 tablet 2  . busPIRone (BUSPAR) 5 MG tablet TAKE 1 TABLET(5 MG) BY MOUTH TWICE DAILY (Patient taking differently: Take 5 mg by mouth daily as needed.) 180 tablet 3  . cephALEXin (KEFLEX) 500 MG  capsule Take 1 capsule (500 mg total) by mouth 4 (four) times daily for 5 days. 20 capsule 0  . Colchicine (MITIGARE) 0.6 MG CAPS Take 0.6 mg by mouth daily. 1.2mg  first dose, followed by 0.6mg  1 hour later. Followed by 0.6mg  daily. (Patient not taking: Reported on 08/29/2020) 30 capsule 0  . cyanocobalamin 1000 MCG tablet Take 1 tablet (1,000 mcg total) by mouth daily. (Patient not taking: No sig reported) 30 tablet 1  . ferrous sulfate 324 MG TBEC Take 1 tablet (324 mg total) by mouth daily. (Patient not taking: No sig reported) 90 tablet 1  . furosemide (LASIX) 40 MG tablet Take 2 tablets (80 mg total) by mouth daily. 180 tablet 0  . Melatonin 5 MG CAPS Take 10 mg by mouth at bedtime.    . metoprolol succinate (TOPROL-XL) 50 MG 24 hr tablet TAKE 1 TABLET(50 MG) BY MOUTH DAILY WITH OR IMMEDIATELY FOLLOWING A MEAL 90 tablet 2  . Multiple Vitamin (MULTIVITAMIN) tablet Take 1 tablet by mouth daily.    . pantoprazole (PROTONIX) 40 MG tablet TAKE 1 TABLET(40 MG) BY MOUTH DAILY 90 tablet 1  . traZODone (DESYREL) 50 MG tablet Take 0.5-1 tablets (25-50 mg total) by mouth at bedtime as needed for sleep. (Patient not taking: Reported on 08/29/2020) 30 tablet 1    Objective: BP 97/63 (BP Location: Left Arm)   Pulse 97   Temp (!) 97.4 F (36.3 C) (Oral)   Resp 18   Wt 78.9 kg   SpO2 96%   BMI 31.83 kg/m   Physical Exam:  General: 75 y.o. female in NAD HEENT: NCAT, PERRL, EOMI Neck: supple, no significant  JVD Cardio: RRR RUSB systolic murmur Lungs: CTAB, no wheezing, no rhonchi, no crackles, no IWOB on RA Abdomen: Soft, non-tender to palpation, non-distended, positive bowel sounds Skin: warm and dry, right fourth toe with erythema from toenail to proximal phalanx with two purulent pustules on dorsal aspect, mild TTP Extremities: 2+ pitting edema BLE to knees, 2+ DP pulses b/l, 5/5 strength BUE/BLE, able to move all extremities and all toes Neuro: CN II-XII grossly intact, sensation intact  throughout all fields Psych: mood and affect appropriate for circumstance, ANO x2 to person and place only, unable to recall details throughout conversation that are mentioned       Labs and Imaging: CBC BMET  Recent Labs  Lab 08/31/20 0212  WBC 6.1  HGB 12.7  HCT 40.1  PLT 196   Recent Labs  Lab 08/31/20 0212  NA 136  K 4.4  CL 95*  CO2 27  BUN 69*  CREATININE 2.52*  GLUCOSE 159*  CALCIUM 9.5     EKG: no acute changes  No results found.  [place], DO 08/31/2020, 3:33 AM PGY-3, [place] Intern pager: [phone], text pages welcome

## 2020-08-30 NOTE — Progress Notes (Signed)
Patient is direct admit from home and she's is accompanied by her daughter. A & O x 3. Denied any acute pain. Paged the admission MD to notify of patient's arrival. Will continue to monitor.

## 2020-08-30 NOTE — Telephone Encounter (Signed)
Please see below response for PA. Pharmacy called and provided with approval.      Talbot Grumbling, RN

## 2020-08-30 NOTE — Telephone Encounter (Signed)
Patient's daughter calls nurse line requesting refill on Furosemide. Medication was increased to 80 mg yesterday. Daughter is requesting new rx for 80 mg tablets.   To PCP  Talbot Grumbling, RN

## 2020-08-30 NOTE — Telephone Encounter (Signed)
Spoke with pharmacist regarding patient's prescriptions. Insurance does not cover colchicine. However, they cover brand name Mitigare.   Provided verbal authorization per Dr. Erin Hearing to switch to Medicare covered Mitigare.   Called and LVM with daughter to provide update.    Talbot Grumbling, RN

## 2020-08-31 ENCOUNTER — Observation Stay (HOSPITAL_COMMUNITY): Payer: Medicare HMO

## 2020-08-31 ENCOUNTER — Encounter (HOSPITAL_COMMUNITY): Payer: Self-pay | Admitting: Family Medicine

## 2020-08-31 ENCOUNTER — Other Ambulatory Visit: Payer: Self-pay

## 2020-08-31 DIAGNOSIS — Z20822 Contact with and (suspected) exposure to covid-19: Secondary | ICD-10-CM | POA: Diagnosis not present

## 2020-08-31 DIAGNOSIS — N179 Acute kidney failure, unspecified: Secondary | ICD-10-CM | POA: Diagnosis not present

## 2020-08-31 DIAGNOSIS — I5043 Acute on chronic combined systolic (congestive) and diastolic (congestive) heart failure: Secondary | ICD-10-CM | POA: Diagnosis not present

## 2020-08-31 DIAGNOSIS — I130 Hypertensive heart and chronic kidney disease with heart failure and stage 1 through stage 4 chronic kidney disease, or unspecified chronic kidney disease: Secondary | ICD-10-CM | POA: Diagnosis not present

## 2020-08-31 DIAGNOSIS — R600 Localized edema: Secondary | ICD-10-CM | POA: Diagnosis not present

## 2020-08-31 DIAGNOSIS — I509 Heart failure, unspecified: Secondary | ICD-10-CM | POA: Diagnosis not present

## 2020-08-31 DIAGNOSIS — Z515 Encounter for palliative care: Secondary | ICD-10-CM

## 2020-08-31 DIAGNOSIS — M869 Osteomyelitis, unspecified: Secondary | ICD-10-CM | POA: Diagnosis not present

## 2020-08-31 DIAGNOSIS — F0281 Dementia in other diseases classified elsewhere with behavioral disturbance: Secondary | ICD-10-CM | POA: Diagnosis not present

## 2020-08-31 DIAGNOSIS — I517 Cardiomegaly: Secondary | ICD-10-CM | POA: Diagnosis not present

## 2020-08-31 DIAGNOSIS — F5101 Primary insomnia: Secondary | ICD-10-CM | POA: Diagnosis not present

## 2020-08-31 DIAGNOSIS — R52 Pain, unspecified: Secondary | ICD-10-CM | POA: Diagnosis not present

## 2020-08-31 DIAGNOSIS — I5021 Acute systolic (congestive) heart failure: Secondary | ICD-10-CM | POA: Diagnosis not present

## 2020-08-31 DIAGNOSIS — Z66 Do not resuscitate: Secondary | ICD-10-CM

## 2020-08-31 DIAGNOSIS — W57XXXA Bitten or stung by nonvenomous insect and other nonvenomous arthropods, initial encounter: Secondary | ICD-10-CM | POA: Diagnosis not present

## 2020-08-31 DIAGNOSIS — I5023 Acute on chronic systolic (congestive) heart failure: Secondary | ICD-10-CM

## 2020-08-31 DIAGNOSIS — S40862A Insect bite (nonvenomous) of left upper arm, initial encounter: Secondary | ICD-10-CM | POA: Diagnosis not present

## 2020-08-31 DIAGNOSIS — F05 Delirium due to known physiological condition: Secondary | ICD-10-CM | POA: Diagnosis not present

## 2020-08-31 DIAGNOSIS — Z7189 Other specified counseling: Secondary | ICD-10-CM | POA: Diagnosis not present

## 2020-08-31 LAB — BASIC METABOLIC PANEL
Anion gap: 14 (ref 5–15)
Anion gap: 13 (ref 5–15)
BUN: 69 mg/dL — ABNORMAL HIGH (ref 8–23)
BUN: 72 mg/dL — ABNORMAL HIGH (ref 8–23)
CO2: 27 mmol/L (ref 22–32)
CO2: 26 mmol/L (ref 22–32)
Calcium: 9.5 mg/dL (ref 8.9–10.3)
Calcium: 9.3 mg/dL (ref 8.9–10.3)
Chloride: 95 mmol/L — ABNORMAL LOW (ref 98–111)
Chloride: 99 mmol/L (ref 98–111)
Creatinine, Ser: 2.52 mg/dL — ABNORMAL HIGH (ref 0.44–1.00)
Creatinine, Ser: 2.72 mg/dL — ABNORMAL HIGH (ref 0.44–1.00)
GFR, Estimated: 19 mL/min — ABNORMAL LOW (ref >60)
GFR, Estimated: 17 mL/min — ABNORMAL LOW (ref >60)
Glucose, Bld: 159 mg/dL — ABNORMAL HIGH (ref 70–99)
Glucose, Bld: 147 mg/dL — ABNORMAL HIGH (ref 70–99)
Potassium: 4.4 mmol/L (ref 3.5–5.1)
Potassium: 4.1 mmol/L (ref 3.5–5.1)
Sodium: 136 mmol/L (ref 135–145)
Sodium: 138 mmol/L (ref 135–145)

## 2020-08-31 LAB — URINALYSIS, ROUTINE W REFLEX MICROSCOPIC
Bilirubin Urine: NEGATIVE
Glucose, UA: NEGATIVE mg/dL
Hgb urine dipstick: NEGATIVE
Ketones, ur: NEGATIVE mg/dL
Nitrite: NEGATIVE
Protein, ur: NEGATIVE mg/dL
Specific Gravity, Urine: 1.013 (ref 1.005–1.030)
pH: 5 (ref 5.0–8.0)

## 2020-08-31 LAB — ECHOCARDIOGRAM COMPLETE
S' Lateral: 5.7 cm
Weight: 2761.92 oz

## 2020-08-31 LAB — CBC WITH DIFFERENTIAL/PLATELET
Abs Immature Granulocytes: 0.02 10*3/uL (ref 0.00–0.07)
Basophils Absolute: 0 10*3/uL (ref 0.0–0.1)
Basophils Relative: 1 %
Eosinophils Absolute: 0 10*3/uL (ref 0.0–0.5)
Eosinophils Relative: 0 %
HCT: 40.1 % (ref 36.0–46.0)
Hemoglobin: 12.7 g/dL (ref 12.0–15.0)
Immature Granulocytes: 0 %
Lymphocytes Relative: 17 %
Lymphs Abs: 1 10*3/uL (ref 0.7–4.0)
MCH: 32 pg (ref 26.0–34.0)
MCHC: 31.7 g/dL (ref 30.0–36.0)
MCV: 101 fL — ABNORMAL HIGH (ref 80.0–100.0)
Monocytes Absolute: 0.7 10*3/uL (ref 0.1–1.0)
Monocytes Relative: 11 %
Neutro Abs: 4.4 10*3/uL (ref 1.7–7.7)
Neutrophils Relative %: 71 %
Platelets: 196 10*3/uL (ref 150–400)
RBC: 3.97 MIL/uL (ref 3.87–5.11)
RDW: 14.6 % (ref 11.5–15.5)
WBC: 6.1 10*3/uL (ref 4.0–10.5)
nRBC: 0 % (ref 0.0–0.2)

## 2020-08-31 LAB — MAGNESIUM: Magnesium: 1.8 mg/dL (ref 1.7–2.4)

## 2020-08-31 LAB — BRAIN NATRIURETIC PEPTIDE: B Natriuretic Peptide: >4500 pg/mL — ABNORMAL HIGH (ref 0.0–100.0)

## 2020-08-31 LAB — TROPONIN I (HIGH SENSITIVITY)
Troponin I (High Sensitivity): 72 ng/L — ABNORMAL HIGH (ref <18)
Troponin I (High Sensitivity): 77 ng/L — ABNORMAL HIGH (ref <18)

## 2020-08-31 LAB — SARS CORONAVIRUS 2 (TAT 6-24 HRS): SARS Coronavirus 2: NEGATIVE

## 2020-08-31 MED ORDER — ARIPIPRAZOLE 5 MG PO TABS
5.0000 mg | ORAL_TABLET | Freq: Every day | ORAL | Status: DC
  Administered 2020-08-31 – 2020-09-05 (×6): 5 mg via ORAL
  Filled 2020-08-31 (×7): qty 1

## 2020-08-31 MED ORDER — ENOXAPARIN SODIUM 30 MG/0.3ML IJ SOSY
30.0000 mg | PREFILLED_SYRINGE | INTRAMUSCULAR | Status: DC
  Administered 2020-08-31 – 2020-09-08 (×7): 30 mg via SUBCUTANEOUS
  Filled 2020-08-31 (×7): qty 0.3

## 2020-08-31 MED ORDER — MELATONIN 5 MG PO TABS
10.0000 mg | ORAL_TABLET | Freq: Every day | ORAL | Status: DC
  Administered 2020-08-31: 10 mg via ORAL
  Filled 2020-08-31: qty 2

## 2020-08-31 MED ORDER — FUROSEMIDE 10 MG/ML IJ SOLN: 40.0000 mg | Freq: Once | INTRAMUSCULAR | Status: DC

## 2020-08-31 MED ORDER — ATORVASTATIN CALCIUM 80 MG PO TABS
80.0000 mg | ORAL_TABLET | Freq: Every day | ORAL | Status: DC
  Administered 2020-09-01 – 2020-09-07 (×7): 80 mg via ORAL
  Filled 2020-08-31 (×8): qty 1

## 2020-08-31 MED ORDER — PANTOPRAZOLE SODIUM 40 MG PO TBEC
40.0000 mg | DELAYED_RELEASE_TABLET | Freq: Every day | ORAL | Status: DC
  Administered 2020-08-31 – 2020-09-08 (×9): 40 mg via ORAL
  Filled 2020-08-31 (×9): qty 1

## 2020-08-31 MED ORDER — POLYETHYLENE GLYCOL 3350 17 G PO PACK
17.0000 g | PACK | Freq: Every day | ORAL | Status: DC
  Administered 2020-09-03 – 2020-09-11 (×4): 17 g via ORAL
  Filled 2020-08-31 (×8): qty 1

## 2020-08-31 MED ORDER — METOPROLOL SUCCINATE ER 50 MG PO TB24
50.0000 mg | ORAL_TABLET | Freq: Every day | ORAL | Status: DC
  Administered 2020-09-01: 50 mg via ORAL
  Filled 2020-08-31 (×2): qty 1

## 2020-08-31 MED ORDER — FUROSEMIDE 80 MG PO TABS
80.0000 mg | ORAL_TABLET | Freq: Once | ORAL | Status: AC
  Administered 2020-08-31: 80 mg via ORAL
  Filled 2020-08-31: qty 1

## 2020-08-31 MED ORDER — FUROSEMIDE 10 MG/ML IJ SOLN
40.0000 mg | Freq: Once | INTRAMUSCULAR | Status: AC
  Administered 2020-08-31: 40 mg via INTRAVENOUS
  Filled 2020-08-31: qty 4

## 2020-08-31 MED ORDER — CEPHALEXIN 250 MG PO CAPS
500.0000 mg | ORAL_CAPSULE | Freq: Three times a day (TID) | ORAL | Status: AC
  Administered 2020-08-31 – 2020-09-06 (×21): 500 mg via ORAL
  Filled 2020-08-31 (×21): qty 2

## 2020-08-31 MED ORDER — NYSTATIN 100000 UNIT/GM EX OINT
TOPICAL_OINTMENT | Freq: Two times a day (BID) | CUTANEOUS | Status: DC
  Filled 2020-08-31: qty 15

## 2020-08-31 NOTE — Progress Notes (Signed)
FPTS Interim Progress Note  S:Recieved call from RN that patient pulling of lines and restless, trying to get out of bed. Reports that she refused ECHO. Went to assess patient who was laying in bed.  Pleasantly confused, needed some reassurance and redirection. She did take her morning Abilify for me.  Denies any chest pain, shortness of breath or difficulty breathing.  Reports voided twice today.    O: BP 103/73 (BP Location: Right Arm)   Pulse 86   Temp (!) 97.3 F (36.3 C) (Oral)   Resp 20   Wt 78.3 kg   SpO2 97%   BMI 31.57 kg/m    Physical Exam:   General: 75 y.o. female in NAD Cardio: RRR no m/r/g Lungs: CTAB, no wheezing, no rhonchi, no crackles, no IWOB on room air Extremities: significant bilaterally lower extremity edema Neuro: Pleasantly confused, oriented to self.    A/P: Dementia Continue to redirect Abilify given Tele monitor ordered RN had called daughter [name] who will be arriving later  CHF Continues to have lower extremity edema.  Unable to obtain ECHO.  Last EF 20-25% I have asked the RN to call me when ECHO is able to return and I will go to room to help direct Received Lasix 80 mg today Unable to get clear urine output Will bladder scan to see if retaining    Carollee Leitz, MD 08/31/2020, 2:21 PM PGY-2, [place] Medicine Service pager [phone]

## 2020-08-31 NOTE — Care Management Obs Status (Signed)
[place] NOTIFICATION   Patient Details  Name: Hannah [name] MRN: [id number] Date of Birth: 1946/04/12   Medicare Observation Status Notification Given:  Yes    Bethena Roys, RN 08/31/2020, 3:19 PM

## 2020-08-31 NOTE — Progress Notes (Addendum)
Family Medicine Teaching Service Daily Progress Note Intern Pager: [phone]  [name] Medical record number: [id number] Date of birth: 03/29/1946 Age: 75 y.o. Gender: female  Primary Care Provider: [name], MD Consultants: None Code Status: Full  Pt Overview and Major Events to Date:  5/13 Admitted to FPTS  Assessment and Plan: Hannah [name] is a 75 y.o. female presenting with BLE edema, AKI, right 4th toe redness. PMH is significant for chronic HFrEF (last Echo 01/24/20- EF 20-25%), HTN, Dementia, GI bleed s/p mesenteric artery coil embolization, anxiety, severe mitral regurgitation s/p MitraClip (02/02/19), symptomatic anemia, insomnia  Acute on Chronic CHF Exacerbation HFrEF with EF 20-25%, S/P Mitra Clip Did not receive IV Lasix overnight as she lost IV.  IV team paged to replace.  CXR still pending.  BNP elevated to >4500.  No output has been recorded yet, patient is also not yet received her diuresis.  Creatinine from admission labs resulted at 2.52, up from 2.33 on 5/12.  Continues to have BLE edema on exam, lungs remain clear on exam.  Patient denies shortness of breath but states that her body is aching all over and she feels like she can't breath because of that.  Denies chest pain or dyspneic chest pain.  Suspect that this will improve with diuresis once this is able to begin. - Strict I's and O's - Daily weights - monitor Cr - 1600 BMP, re-eval with these labs and likely redose Lasix, she may need 80mg  if UOP is not sufficient - Lasix changed to PO 80mg  to not delay further with lack of IV - attempting to get IV access - f/u CXR, changed to STAT - repeat Echo ordered - consider cards consult if not having improvement, follows with Dr. Johnsie [name], has not seen Cards since 10/21 - Will trend Trop  R 4th Toe Cellulitis Started keflex this AM.  Appearance unchanged from admission. WBC 6.1.  Remains afebrile.   - keflex 500mg  q8h (5/14- ) -  consider starting colchicine if no improvement with cellulitis treatment  Residual Moderate MR S/P Mitra Clip 02/03/19 Mild-Moderate Aortic Stenosis Patient's most recent echo on 01/24/20 shows EF of 20-25%, with moderate mitral valve regurgitation and suggested mitral clip still in place.  ASA and Plavix d/c'ed previously for GI bleed.  Losartan held in prior admission for AKI and not restarted.  On Toprol XL 50mg  QD, Lasix 40mg , but increased to 80 on 5/12.   - continue home metoprolol - Echo per above  AKI on CKD Stage IIIB Baseline Cr ~ 1.3-1.5.  Likely secondary to fluid overload.  Admission creatinine resulted in elevated to 2.52 up from 2.33. - diuresis per above - monitor BMP  HTN: Chronic, stable BP this a.m. 100/64. - cont home Toprol 50 mg daily - moniotr BP closely with diuresis  HLD: Chronic, stable Home meds: Lipitor 80 mg - cont home lipitor  Dementia I Hallucinations Palliative consulted, will follow goals of care discussion.  Patient redirectable overnight, but pullin goff telemetry. This AM, does not recall why she is in the hospital or who brought her, but is pleasant and cooperative on exam. - Continue abilify 5mg  QD  - delirium precautions - consult palliative for GOC discussion - consider MRI pending palliative discussion given daughter's report of intermittent slurring of speech during some of the hallucination episodes  Anxiety Home meds: Buspar 5 mg BID PRN - can add on if needed  Insomnia Likely related to dementia.  Home meds: Melatonin  10 mg QHS, Trazodone 50 mg (discontinued due to concerns of hallucinations) - continue melatonin   GERD Home meds: Protonix 40 mg - continue home meds  Osteoarthritis She is complaining of nondescript body achiness this AM, asked nurse to administer tylenol.  Lumbar spine x-ray on 08/26/2020 shows multilevel disc osteophytic disease with no acute fndings and no significant changes from CT 2020. Home meds:  Tylenol 325 mg Q6h PRN - tylenol prn  Hx of symptomatic anemia I Hx GI Bleed No longer on any antiplatelets.   S/p mesenteric artery coil embolization.    Hemoglobin stable at 12.7. Home meds: Ferrous sulphate 324 mg Q day, but reports not taking - monitor CBC closely while on VTE PPx  Incidental pulmonary nodule Seen on CTA 09/2019.   - repeat outpatient June 2022  Candidal Intertrigo Noted on admission exam under breasts and in groin - nystatin ointment BID   FEN/GI: Heart Healthy Prophylaxis: Lovenox adjusted for CrCl <30   Status is: Observation  The patient remains OBS appropriate and will d/c before 2 midnights.  Dispo: The patient is from: Home              Anticipated d/c is to: Home              Patient currently is not medically stable to d/c.   Difficult to place patient No        Subjective:  Patient complains of whole body achiness.  She denies chest pain adamantly.  Also denies dyspneic chest pain.  She denies shortness of breath.  She does state however that the achiness in her body makes her feel like she cannot breathe.  She is unaware of why she is currently in the hospital and who brought her.  Objective: Temp:  [97.4 F (36.3 C)] 97.4 F (36.3 C) (05/13 2305) Pulse Rate:  [97] 97 (05/13 2305) Resp:  [18] 18 (05/13 2305) BP: (97)/(63) 97/63 (05/13 2305) SpO2:  [96 %] 96 % (05/13 2305) Weight:  [78.9 kg] 78.9 kg (05/13 2345)  Physical Exam:  General: 75 y.o. female in NAD Cardio: RRR Lungs: CTAB, no wheezing, no rhonchi, no crackles, no IWOB on RA Abdomen: Soft, non-tender to palpation, non-distended, positive bowel sounds Skin: warm and dry Extremities: 2+ pitting edema BLE to knee Skin: Right fourth toe with continued erythema, unchanged from admission exam last night   Laboratory: Recent Labs  Lab 08/29/20 1653 08/31/20 0212  WBC 6.9 6.1  HGB 12.7 12.7  HCT 40.1 40.1  PLT 191 196   Recent Labs  Lab 08/29/20 1653  08/31/20 0212  NA 138 136  K 4.3 4.4  CL 95* 95*  CO2 23 27  BUN 66* 69*  CREATININE 2.33* 2.52*  CALCIUM 9.5 9.5  GLUCOSE 158* 159*     Imaging/Diagnostic Tests: No results found.  [place], DO 08/31/2020, 5:07 AM PGY-3, [place] Intern pager: [phone], text pages welcome

## 2020-08-31 NOTE — Evaluation (Signed)
Physical Therapy Evaluation Patient Details Name: Hannah [name] MRN: [id number] DOB: 13-Aug-1945 Today's Date: 08/31/2020   History of Present Illness  75 y.o. female referred to [facility] by PCP after labs showed an AKI, and in the setting of a suspected CHF exacerbation. Pt  presents with BLE edema, AKI, and right 4th toe redness. Pt was diagnosed with acute on chronic CHF exacerbation, cellulitis of R 4th toe 5/11. PMH includes chronic HFrEF, HTN, dementia, GI bleed s/p mesenteric artery coil embolization, anxiety, severe mitral regurgitation s/p MitraClip 02/02/19, symtpomatic anemia, insomnia.  Clinical Impression  Pt demonstrates ability to perform bed mobility, sit>stand transfers, and ambulation without requiring physical assistance. Pt does demonstrate some confusion during session, stating that she may need a bed [facility]. Pt is limited in her mobility today secondary to reports of back pain. Pt ambulates short distances with RW before onset of fatigue. Pt demonstrates deficits in strength, endurance, activity tolerance, and cognition and will benefit from acute PT to improve safety and independent mobility. SPT recommends HHPT to assist in improving activity tolerance and aid in independent mobility.    Follow Up Recommendations Home health PT;Supervision/Assistance - 24 hour    Equipment Recommendations  None recommended by PT    Recommendations for Other Services       Precautions / Restrictions Precautions Precautions: Fall Restrictions Weight Bearing Restrictions: No      Mobility  Bed Mobility Overal bed mobility: Needs Assistance Bed Mobility: Supine to Sit     Supine to sit: Min guard     General bed mobility comments: min G for safety. Pt fearful movement will exacerbate back pain.    Transfers Overall transfer level: Needs assistance Equipment used: Rolling walker (2 wheeled) Transfers: Sit to/from Stand Sit to Stand: Min guard          General transfer comment: Pt requires VCs for hand placement.  Ambulation/Gait Ambulation/Gait assistance: Min guard Gait Distance (Feet): 25 Feet Assistive device: Rolling walker (2 wheeled) Gait Pattern/deviations: Step-through pattern;Decreased stride length Gait velocity: decreased Gait velocity interpretation: 1.31 - 2.62 ft/sec, indicative of limited community ambulator General Gait Details: Pt fatigues during gait and requests to sit down. Pt reports needing to rest due to her back pain. Pt encouraged to attempt to walk again with nurse later in the day.  Stairs            Wheelchair Mobility    Modified Rankin (Stroke Patients Only)       Balance Overall balance assessment: Needs assistance Sitting-balance support: No upper extremity supported;Feet supported Sitting balance-Leahy Scale: Good     Standing balance support: Bilateral upper extremity supported;During functional activity;Single extremity supported Standing balance-Leahy Scale: Poor Standing balance comment: Pt relies upon at least single UE support from RW during static standing and with gait.                             Pertinent Vitals/Pain Pain Assessment: 0-10 Pain Location: back Pain Descriptors / Indicators: Discomfort;Grimacing Pain Intervention(s): Limited activity within patient's tolerance;Monitored during session    Home Living Family/patient expects to be discharged to:: Private residence Living Arrangements: Children Available Help at Discharge: Family;Available PRN/intermittently (in previous admission, family had been providing 24 hour assistance.) Type of Home: House Home Access: Stairs to enter;Ramped entrance (Ramped entrance per notes from prior admission.) Entrance Stairs-Rails: Can reach both Entrance Stairs-Number of Steps: 8-10 (Pt reports 8-10 to PT, but  reports 5-6 with OT eval.) Home Layout: One level Home Equipment: Walker - 2 wheels;Cane -  quad;Wheelchair - manual (Pt reports having walker and quad cane, from prior admission pt is noted to have a RW and manual w/c.)      Prior Function Level of Independence: Independent         Comments: Pt is a household ambulator with a walker, pt is pushed in w/c by family when out in the community per notes from prior admission.     Hand Dominance        Extremity/Trunk Assessment   Upper Extremity Assessment Upper Extremity Assessment: Defer to OT evaluation    Lower Extremity Assessment Lower Extremity Assessment: Generalized weakness       Communication   Communication: No difficulties  Cognition Arousal/Alertness: Awake/alert Behavior During Therapy: WFL for tasks assessed/performed Overall Cognitive Status: Impaired/Different from baseline Area of Impairment: Orientation;Problem solving;Awareness;Following commands                 Orientation Level: Situation;Disoriented to;Time;Place     Following Commands: Follows one step commands consistently;Follows multi-step commands with increased time   Awareness: Emergent Problem Solving: Requires verbal cues;Requires tactile cues;Decreased initiation General Comments: Hx of dementia. Pt responds to therapist with I love you. When asked where she is, pt is able to respond accurately but says she needs a bed in the hospital while in her hospital room.      General Comments General comments (skin integrity, edema, etc.): Pt demonstrates confusion during session today. Pt reports possibly needing a bed in the facility, while in her bed.    Exercises     Assessment/Plan    PT Assessment Patient needs continued PT services  PT Problem List Decreased strength;Decreased activity tolerance;Decreased mobility;Decreased cognition;Decreased knowledge of use of DME;Decreased safety awareness;Decreased knowledge of precautions;Pain       PT Treatment Interventions DME instruction;Gait training;Functional mobility  training;Therapeutic activities;Therapeutic exercise;Balance training;Patient/family education    PT Goals (Current goals can be found in the Care Plan section)  Acute Rehab PT Goals Patient Stated Goal: Go home PT Goal Formulation: With patient Time For Goal Achievement: 09/14/20 Potential to Achieve Goals: Good    Frequency Min 3X/week   Barriers to discharge        Co-evaluation               AM-PAC PT "6 Clicks" Mobility  Outcome Measure Help needed turning from your back to your side while in a flat bed without using bedrails?: A Little Help needed moving from lying on your back to sitting on the side of a flat bed without using bedrails?: A Little Help needed moving to and from a bed to a chair (including a wheelchair)?: A Little Help needed standing up from a chair using your arms (e.g., wheelchair or bedside chair)?: A Little Help needed to walk in hospital room?: A Little Help needed climbing 3-5 steps with a railing? : A Lot 6 Click Score: 17    End of Session Equipment Utilized During Treatment: Gait belt Activity Tolerance: Patient limited by fatigue;Patient limited by pain Patient left: in chair;with chair alarm set;with call bell/phone within reach Nurse Communication: Mobility status PT Visit Diagnosis: Muscle weakness (generalized) (M62.81);Other abnormalities of gait and mobility (R26.89);Pain Pain - Right/Left:  (back) Pain - part of body:  (back)    Time: 1105-1130 PT Time Calculation (min) (ACUTE ONLY): 25 min   Charges:   PT Evaluation $PT Eval Low Complexity: 1 Low  Acute Rehab  Pager: [phone]   Garwin Brothers, SPT  08/31/2020, 1:26 PM

## 2020-08-31 NOTE — Progress Notes (Signed)
Pt refused evening labs  MD notified  Pt is increasingly agitated yelling down hallway and attempting to get out of bed , pt states "I cant breathe I think im having a nervous breakdown" and "I got to get out of here".  Family currently at bedside but cannot stay the night  telesitter also present  No new orders at this time

## 2020-08-31 NOTE — Progress Notes (Signed)
Patient refused the COVID swab ordered. Stated she doesn't have Covid. Notified Dr. Meredith Staggers  D[place] without any new order. Will continue to monitor.

## 2020-08-31 NOTE — Progress Notes (Signed)
Pt refused morning medications  MD aware

## 2020-08-31 NOTE — Progress Notes (Addendum)
  Echocardiogram 2D Echocardiogram has been attempted. Unable to perform complete echocardiogram due to patient being uncooperative and patient movement.  Hannah [name] 08/31/2020, 1:56 PM

## 2020-08-31 NOTE — Consult Note (Signed)
Palliative Medicine Inpatient Consult Note  Reason for consult:  Goals of Care "Goals of care discussion, worsening dementia"  HPI:  Per intake H&P --> Hannah B Robertsis a 75 y.o.femalepresenting with BLE edema, AKI, right 4th toe redness. PMH is significant forchronic HFrEF (last Echo 01/24/20- EF 20-25%), HTN, Dementia, GI bleed s/p mesenteric artery coil embolization, anxiety, severe mitral regurgitation s/p MitraClip (02/02/19), symptomatic anemia, insomnia.  Palliative care has been asked to get involved to aid in goals of care conversations in the setting of progressing dementia.   Clinical Assessment/Goals of Care:  *Please note that this is a verbal dictation therefore any spelling or grammatical errors are due to the "[place]" system interpretation.  I have reviewed medical records including EPIC notes, labs and imaging, received report from bedside RN, assessed the patient who is lying in bed in no acute distress.    I called patient's daughter, Hannah [name] to further discuss diagnosis prognosis, [place], EOL wishes, disposition and options.   I introduced Palliative Medicine as specialized medical care for people living with serious illness. It focuses on providing relief from the symptoms and stress of a serious illness. The goal is to improve quality of life for both the patient and the family.  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  Hannah [name] is from [place], [place] originally.  She has lived in [place] for much of her life.  She has been married twice.  Hannah [name] has 3 children from her8First marriage Hannah [name], [place], and [place].  She has 2 children from her second marriage Hannah [name], and Hannah [name].  [name] is estranged from her daughter Hannah [name] to some degree as Hannah [name] committed fraudulent things against Hannah [name] and essentially advantage of all of her financial aid leaving [place] with no  financial means.  Hannah [name] son, Hannah [name] lives in [place] and is able to offer assistance if needed.  Hannah [name] used to work for her brother-in-law at the first [employer].  She is a woman of faith and practices within the [name].  Prior to hospitalization Hannah [name] was able to perform her basic activities of daily living.  Showering required some assistance from her daughter Hannah [name].  Hannah [name] shares that they do have a shower chair, grab bars, and a hand-held showerhead.  A review of Hannah [name]'s comorbid conditions was had.  Discussed that she has suffered from dementia since the year 2016.  At that time she was identified to have a fast score of four.  It has been difficult over the years to identify Hannah [name]'s progression of her dementia as her daughter Hannah [name] at 1 point was living with her and [name] of 2 years ago was circulating in and out of the hospital to rehabilitations until Hannah [name] assumed care of [name].  It does seem though that Hannah [name]'s dementia has progressed beyond to now a fast score of 5.  We further reviewed [name]'s history of heart failure which is identified as relatively severe with an ejection fraction of 20 to 25%.  Discussed the seriousness of this diagnosis in the grand scheme of things.  Reviewed that over time heart failure is anticipated to worsen just as dementia is and eventually gets to a point where symptom burden can be very difficult to control.   Patient's daughter, Hannah [name] is very well versed in her mother's comorbidities and aware of her mother's overall poor condition.  [name]'s goals are to optimize her mother's health as well  as able.  Hannah [name] would like to continue caring for her mother as long as possible though she realizes that it may be a day in time where she is no longer able to offer her mother 24/7 care.  Hannah [name] vocalizes that if that day does calm her brother, Hannah [name] would be able to help financially to find her mother a facility to better  support her needs.  A detailed discussion was had today regarding advanced directives - this had previously been completed by Hannah [name].  I have requested of Hannah [name] to provide a copy so that we may scan it into [facility].    Concepts specific to code status, artifical feeding and hydration, continued IV antibiotics and rehospitalization was had.  Hannah [name] shares with me that she realizes her resuscitation effort on her mom would cause her mother rate trauma with little to no benefit.  Even though she believes this to be true she would like the opportunity to discuss it with her other siblings prior to making an official decision.  I shared with her that I will provide a MOST form at bedside as I will provide the, "Hard Choices for Loving People" booklet for her review  Hannah [name] is open to having outpatient palliative support.  I believe Hannah [name] would be a great candidate for the care connections program through hospice of the [place].  Discussed the importance of continued conversation with family and their  medical providers regarding overall plan of care and treatment options, ensuring decisions are within the context of the patients values and GOCs.  Decision Maker: Hannah [name] (Daughter) [phone]  SUMMARY OF RECOMMENDATIONS   Full code-for the time being, patient's daughter Hannah [name] plans to speak with her other siblings regarding this.  I strongly advocated for DO NOT RESUSCITATE CODE STATUS.  Transitions of care team consulted to help with outpatient palliative care consultation.  Patient's daughter has also requested information on outpatient daycare programs which I am hopeful our social worker can help [place] with.  Ongoing incremental palliative support in the oncoming days  Code Status/Advance Care Planning: FULL CODE   Palliative Prophylaxis:   Oral Care, Mobility, Delirium precautions  Additional Recommendations (Limitations, Scope, Preferences):  Continue current scope of care    Psycho-social/Spiritual:   Desire for further Chaplaincy support: No  Additional Recommendations: Education on chronic conditions inclusive of Alzheimer's dementia and heart failure   Prognosis:  Multiple comorbid conditions progressive dementia, heart failure, and kidney disease.  Has a high risk of 30-month mortality.  Discharge Planning: Discharge will be home with OP Palliative support  Vitals:   08/31/20 0517 08/31/20 0536  BP: 95/62 106/71  Pulse: 88 65  Resp: 18   Temp: (!) 97.4 F (36.3 C) 98.3 F (36.8 C)  SpO2: 97% 97%   No intake or output data in the 24 hours ending 08/31/20 0707 Last Weight  Most recent update: 08/31/2020  5:26 AM   Weight  78.3 kg (172 lb 9.9 oz)           Gen: Robust elderly Caucasian female in no acute distress HEENT: moist mucous membranes CV: Regular rate and rhythm PULM: On room air ABD: soft/nontender EXT: Bilateral lower extremity +2 pitting edema Neuro: Alert and oriented x2  PPS: 50-60%   This conversation/these recommendations were discussed with patients FMT   Time In: 1030 Time Out: 1140 Total Time: 70 Greater than 50%  of this time was spent counseling and coordinating care related to the above assessment and plan.  Sharyn Lull  [place] Team Team Cell Phone: [phone] Please utilize secure chat with additional questions, if there is no response within 30 minutes please call the above phone number  Palliative Medicine Team providers are available by phone from 7am to 7pm daily and can be reached through the team cell phone.  Should this patient require assistance outside of these hours, please call the patient's attending physician.

## 2020-08-31 NOTE — Progress Notes (Signed)
Pt is noncompliant with tele box, removing leads multiple times a shift , pt is getting up form the bed numerous times without calling for assistance.  Bed alarm is on  Pt educated by RN on safe ambulation  MD notified

## 2020-08-31 NOTE — Progress Notes (Signed)
Patient just ost her IV access and as such, she is not getting  her IV Lasix. Consulted to IV team again since she's hard stick. Will administer as soon as she gets  PIV. Patient is not complaint with her cardiac monitor and keep on removing the leads. She .Keeps on  going to the BR and sits on the commode for about an hour trying to have BM.  Patient endorsed  having bad dreams and frequently yelling in her sleep.  Will continue to monitor.

## 2020-08-31 NOTE — Evaluation (Signed)
Occupational Therapy Evaluation Patient Details Name: Hannah [name] MRN: [id number] DOB: 01/17/1946 Today's Date: 08/31/2020    History of Present Illness 75 y.o. female referred to [facility] by PCP after labs showed an AKI, and in the setting of a suspected CHF exacerbation. Pt  presents with BLE edema, AKI, and right 4th toe redness. Pt was diagnosed with acute on chronic CHF exacerbation, cellulitis of R 4th toe 5/11. PMH includes chronic HFrEF, HTN, dementia, GI bleed s/p mesenteric artery coil embolization, anxiety, severe mitral regurgitation s/p MitraClip 02/02/19, symtpomatic anemia, insomnia.   Clinical Impression   PTA patient independent with ADLs, mobility, driving and managing medications.  She was admitted for above and presenting with problem list below, including generalized weakness, impaired balance, decreased activity tolerance, and impaired cognition. Pt disoriented to situation and year, follows commands with increased time but decreased awareness to deficits.  Patient requires min guard for transfers to [facility], up to min guard assist for ADLs.  Limited session due to IV team arriving and assisted pt back to bed with supervision.  Pt with limited activity tolerance and voices fatigue/exhaustion after transfer.  VSS during session, HR 90-94.  Based on performance today, believe pt will benefit from further OT services acutely and after dc at [facility] level to optimize independence and safety with ADL participation.  Recommend further functional cognition testing next session and initial 24/7 support at dc.     Follow Up Recommendations  Home health OT;Supervision/Assistance - 24 hour (inital 24/7)    Equipment Recommendations  None recommended by OT    Recommendations for Other Services       Precautions / Restrictions Precautions Precautions: Fall Restrictions Weight Bearing Restrictions: No      Mobility Bed Mobility Overal bed mobility: Needs Assistance Bed Mobility:  Supine to Sit;Sit to Supine     Supine to sit: Supervision Sit to supine: Supervision   General bed mobility comments: no assist required, supervision for safety    Transfers Overall transfer level: Needs assistance Equipment used: 1 person hand held assist Transfers: Sit to/from Stand;Stand Pivot Transfers Sit to Stand: Min guard Stand pivot transfers: Min guard       General transfer comment: min guard for safety/balance, pivoting to [facility] with hand held assist    Balance Overall balance assessment: Needs assistance Sitting-balance support: No upper extremity supported;Feet supported Sitting balance-Leahy Scale: Good     Standing balance support: Single extremity supported;During functional activity Standing balance-Leahy Scale: Fair Standing balance comment: relies on at least 1 UE support dynamically                           ADL either performed or assessed with clinical judgement   ADL Overall ADL's : Needs assistance/impaired     Grooming: Set up;Sitting   Upper Body Bathing: Set up;Sitting   Lower Body Bathing: Min guard;Sit to/from stand       Lower Body Dressing: Min guard;Sit to/from stand Lower Body Dressing Details (indicate cue type and reason): able to manage socks, min guard sit to stand Toilet Transfer: Min guard;Stand-pivot;BSC           Functional mobility during ADLs: Min guard;Cueing for safety General ADL Comments: pt limited by impaired activity tolerance, generalized weakness, and impaired cognition     Vision   Vision Assessment?: No apparent visual deficits     Perception     Praxis      Pertinent Vitals/Pain Pain Assessment: No/denies  pain     Hand Dominance Right   Extremity/Trunk Assessment Upper Extremity Assessment Upper Extremity Assessment: Generalized weakness   Lower Extremity Assessment Lower Extremity Assessment: Defer to PT evaluation       Communication Communication Communication: No  difficulties   Cognition Arousal/Alertness: Awake/alert Behavior During Therapy: WFL for tasks assessed/performed Overall Cognitive Status: Impaired/Different from baseline Area of Impairment: Orientation;Problem solving;Awareness;Following commands                 Orientation Level: Disoriented to;Time;Situation (year)     Following Commands: Follows one step commands consistently;Follows one step commands with increased time   Awareness: Emergent Problem Solving: Requires verbal cues;Difficulty sequencing General Comments: pt with hx of dementia, follows commands and engages appropriately.  disoriented to year and situation.   General Comments  VSS with HR 90-94 during session; noted decreased activity tolerance and fatigues easily    Exercises     Shoulder Instructions      Home Living Family/patient expects to be discharged to:: Private residence Living Arrangements: Children (daughter- [name]) Available Help at Discharge: Family;Available PRN/intermittently Type of Home: House Home Access: Stairs to enter;Ramped entrance (typically uses ramp) Entrance Stairs-Number of Steps: 5-6   Home Layout: One level     Bathroom Shower/Tub: Occupational psychologist: Standard     Home Equipment: Environmental consultant - 2 wheels;Wheelchair - manual;Shower seat;Grab bars - tub/shower;Bedside commode          Prior Functioning/Environment Level of Independence: Independent        Comments: drives, manages medication, cook and clean        OT Problem List: Decreased strength;Decreased activity tolerance;Impaired balance (sitting and/or standing);Decreased cognition;Decreased safety awareness;Decreased knowledge of use of DME or AE;Decreased knowledge of precautions;Cardiopulmonary status limiting activity;Obesity      OT Treatment/Interventions: Self-care/ADL training;DME and/or AE instruction;Therapeutic activities;Cognitive remediation/compensation;Patient/family  education;Balance training;Therapeutic exercise;Energy conservation    OT Goals(Current goals can be found in the care plan section) Acute Rehab OT Goals Patient Stated Goal: to get home OT Goal Formulation: With patient Time For Goal Achievement: 09/14/20 Potential to Achieve Goals: Good  OT Frequency: Min 2X/week   Barriers to D/C:            Co-evaluation              AM-PAC OT "6 Clicks" Daily Activity     Outcome Measure Help from another person eating meals?: None Help from another person taking care of personal grooming?: A Little Help from another person toileting, which includes using toliet, bedpan, or urinal?: A Little Help from another person bathing (including washing, rinsing, drying)?: A Little Help from another person to put on and taking off regular upper body clothing?: A Little Help from another person to put on and taking off regular lower body clothing?: A Little 6 Click Score: 19   End of Session Nurse Communication: Mobility status  Activity Tolerance: Patient tolerated treatment well Patient left: in bed;with call bell/phone within reach;with bed alarm set;Other (comment) (IV team in room)  OT Visit Diagnosis: Other abnormalities of gait and mobility (R26.89);Muscle weakness (generalized) (M62.81);Other symptoms and signs involving cognitive function                Time: 7846-9629 OT Time Calculation (min): 13 min Charges:  OT General Charges $OT Visit: 1 Visit OT Evaluation $OT Eval Moderate Complexity: 1 Mod  Jolaine Artist, OT Acute Rehabilitation Services Pager [phone] Office [phone]   Delight Stare 08/31/2020, 8:44 AM

## 2020-08-31 NOTE — TOC Initial Note (Signed)
Transition of Care [facility]) - Initial/Assessment Note    Patient Details  Name: Hannah [name] MRN: [id number] Date of Birth: [birth year]  [facility] CM/SW Contact:    Bethena Roys, RN Phone Number: 08/31/2020, 3:21 PM  Clinical Narrative: Case Manager received a referral to offer choice for outpatient palliative services. Case Manager was able to contact daughter Seth Bake live with her. Per daughter she will be able to provide 24 hour supervision in the home. Daughter is open to home health services; however wants to look at the Medicare.gov list that was provided in the room. Case Manager did discuss outpatient palliative services and [name] wanted to discuss with otjher siblings regarding plan of care. No home health or palliative services set up at this time.                  Expected Discharge Plan: [place] Barriers to Discharge: Continued Medical Work up   Patient Goals and CMS Choice Patient states their goals for this hospitalization and ongoing recovery are:: to return home with daughter that provides 24 hour supervision. CMS Medicare.gov Compare Post Acute Care list provided to:: Patient Choice offered to / list presented to : [place]  Expected Discharge Plan and Services Expected Discharge Plan: [place] In-house Referral: Hospice / Palliative Care Discharge Planning Services: CM Consult Post Acute Care Choice: Home Health,Durable Medical Equipment Living arrangements for the past 2 months: Single Family Home  Prior Living Arrangements/Services Living arrangements for the past 2 months: Single Family Home Lives with:: Self,Adult Children Patient language and need for interpreter reviewed:: Yes Do you feel safe going back to the place where you live?: Yes      Need for Family Participation in Patient Care: Yes (Comment) Care giver support system in place?: Yes (comment)   Criminal  Activity/Legal Involvement Pertinent to Current Situation/Hospitalization: No - Comment as needed  Activities of Daily Living Home Assistive Devices/Equipment: None ADL Screening (condition at time of admission) Patient's cognitive ability adequate to safely complete daily activities?: Yes Is the patient deaf or have difficulty hearing?: No Does the patient have difficulty seeing, even when wearing glasses/contacts?: No Does the patient have difficulty concentrating, remembering, or making decisions?: No Patient able to express need for assistance with ADLs?: No Does the patient have difficulty dressing or bathing?: No Independently performs ADLs?: Yes (appropriate for developmental age) Does the patient have difficulty walking or climbing stairs?: No Weakness of Legs: Both Weakness of Arms/Hands: Both  Permission Sought/Granted Permission sought to share information with : Family Estate agent                Emotional Assessment Appearance:: Appears stated age Attitude/Demeanor/Rapport: Engaged Affect (typically observed): Appropriate Orientation: : Oriented to Self Alcohol / Substance Use: Not Applicable Psych Involvement: No (comment)  Admission diagnosis:  CHF (congestive heart failure) ([place]) [I50.9] CHF exacerbation ([place]) [I50.9] Patient Active Problem List   Diagnosis Date Noted  . Leg edema   . CHF exacerbation ([place]) 08/30/2020  . Cellulitis 08/29/2020  . Back pain 08/21/2020  . Acute exacerbation of CHF (congestive heart failure) ([place]) 10/02/2019  . Anemia due to acute blood loss 03/09/2019  . HLD (hyperlipidemia)   . Insomnia 03/01/2019  . Adverse effect of antiplatelet agent 02/22/2019  . S/P mitral valve repair   . CKD (chronic kidney disease) stage 3, GFR 30-59 ml/min (HCC)   . Vitamin B12 deficiency 12/28/2018  . Chronic  combined systolic and diastolic congestive heart failure ([place])   . 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 Gout 09/11/2015  . Essential hypertension 08/09/2008  . GERD 08/09/2008   PCP:  [name], MD Pharmacy:   [facility] [place], [address] [address] Phone: [phone] Fax: [phone]  Readmission Risk Interventions Readmission Risk Prevention Plan 02/03/2019  Transportation Screening Complete  Medication Review ([place]) Complete  PCP or Specialist appointment within 3-5 days of discharge Complete  HRI or [place] Complete  SW Recovery Care/Counseling Consult Complete  [place] Not Applicable  Some recent data might be hidden

## 2020-08-31 NOTE — Progress Notes (Signed)
FPTS Interim Progress Note  S:went to bedside for PM rounding.  Patient sitting in bathroom. Patient noted to be agitiated with staff earlier in the evening.   O: BP 128/61 (BP Location: Left Arm)   Pulse 100   Temp 97.9 F (36.6 C) (Oral)   Resp 16   Wt 78.3 kg   SpO2 91%   BMI 31.57 kg/m   Nontoxic. Sitting in the bathroom with gown and pants on. Speaking in full sentences. No respiratory distress.  Speech is normal with normal rate, latency, volume and intonation.  Behavior is notable for anxiety with no agitation, though she does note that the staff was bothersome to her earlier.  Patient is friendly, cooperative.   A/P: Patient with some confusion and agitation prior to arriving to the room. Consistent with patient's history of dementia. She is pleasant during our conversation. If repeat agitation, continue to redirect, continue tele sitter. Page MD if no improvement.  Additionally, after conversation, patient is agreeable to labs. Called phlebotomy who will come to bedside.   Wilber Oliphant, MD 08/31/2020, 10:56 PM PGY-3, Secretary Medicine Service pager [phone]

## 2020-09-01 DIAGNOSIS — N2889 Other specified disorders of kidney and ureter: Secondary | ICD-10-CM | POA: Diagnosis not present

## 2020-09-01 DIAGNOSIS — Z20822 Contact with and (suspected) exposure to covid-19: Secondary | ICD-10-CM | POA: Diagnosis present

## 2020-09-01 DIAGNOSIS — R410 Disorientation, unspecified: Secondary | ICD-10-CM | POA: Diagnosis not present

## 2020-09-01 DIAGNOSIS — M7989 Other specified soft tissue disorders: Secondary | ICD-10-CM | POA: Diagnosis not present

## 2020-09-01 DIAGNOSIS — R188 Other ascites: Secondary | ICD-10-CM | POA: Diagnosis not present

## 2020-09-01 DIAGNOSIS — R52 Pain, unspecified: Secondary | ICD-10-CM | POA: Diagnosis not present

## 2020-09-01 DIAGNOSIS — F39 Unspecified mood [affective] disorder: Secondary | ICD-10-CM | POA: Diagnosis not present

## 2020-09-01 DIAGNOSIS — I5043 Acute on chronic combined systolic (congestive) and diastolic (congestive) heart failure: Secondary | ICD-10-CM | POA: Diagnosis present

## 2020-09-01 DIAGNOSIS — N179 Acute kidney failure, unspecified: Secondary | ICD-10-CM | POA: Diagnosis present

## 2020-09-01 DIAGNOSIS — I509 Heart failure, unspecified: Secondary | ICD-10-CM | POA: Diagnosis present

## 2020-09-01 DIAGNOSIS — L03031 Cellulitis of right toe: Secondary | ICD-10-CM | POA: Diagnosis present

## 2020-09-01 DIAGNOSIS — F5101 Primary insomnia: Secondary | ICD-10-CM

## 2020-09-01 DIAGNOSIS — I5023 Acute on chronic systolic (congestive) heart failure: Secondary | ICD-10-CM | POA: Diagnosis not present

## 2020-09-01 DIAGNOSIS — Z66 Do not resuscitate: Secondary | ICD-10-CM | POA: Diagnosis not present

## 2020-09-01 DIAGNOSIS — R531 Weakness: Secondary | ICD-10-CM | POA: Diagnosis not present

## 2020-09-01 DIAGNOSIS — E8779 Other fluid overload: Secondary | ICD-10-CM | POA: Diagnosis not present

## 2020-09-01 DIAGNOSIS — M869 Osteomyelitis, unspecified: Secondary | ICD-10-CM | POA: Diagnosis present

## 2020-09-01 DIAGNOSIS — K219 Gastro-esophageal reflux disease without esophagitis: Secondary | ICD-10-CM | POA: Diagnosis present

## 2020-09-01 DIAGNOSIS — F0281 Dementia in other diseases classified elsewhere with behavioral disturbance: Secondary | ICD-10-CM | POA: Diagnosis present

## 2020-09-01 DIAGNOSIS — I130 Hypertensive heart and chronic kidney disease with heart failure and stage 1 through stage 4 chronic kidney disease, or unspecified chronic kidney disease: Secondary | ICD-10-CM | POA: Diagnosis present

## 2020-09-01 DIAGNOSIS — J90 Pleural effusion, not elsewhere classified: Secondary | ICD-10-CM | POA: Diagnosis not present

## 2020-09-01 DIAGNOSIS — E538 Deficiency of other specified B group vitamins: Secondary | ICD-10-CM | POA: Diagnosis present

## 2020-09-01 DIAGNOSIS — Z79899 Other long term (current) drug therapy: Secondary | ICD-10-CM | POA: Diagnosis not present

## 2020-09-01 DIAGNOSIS — N189 Chronic kidney disease, unspecified: Secondary | ICD-10-CM | POA: Diagnosis not present

## 2020-09-01 DIAGNOSIS — Z872 Personal history of diseases of the skin and subcutaneous tissue: Secondary | ICD-10-CM | POA: Diagnosis not present

## 2020-09-01 DIAGNOSIS — E785 Hyperlipidemia, unspecified: Secondary | ICD-10-CM | POA: Diagnosis present

## 2020-09-01 DIAGNOSIS — I5020 Unspecified systolic (congestive) heart failure: Secondary | ICD-10-CM | POA: Diagnosis not present

## 2020-09-01 DIAGNOSIS — K5900 Constipation, unspecified: Secondary | ICD-10-CM | POA: Diagnosis present

## 2020-09-01 DIAGNOSIS — N183 Chronic kidney disease, stage 3 unspecified: Secondary | ICD-10-CM | POA: Diagnosis not present

## 2020-09-01 DIAGNOSIS — I248 Other forms of acute ischemic heart disease: Secondary | ICD-10-CM | POA: Diagnosis present

## 2020-09-01 DIAGNOSIS — F0390 Unspecified dementia without behavioral disturbance: Secondary | ICD-10-CM | POA: Diagnosis not present

## 2020-09-01 DIAGNOSIS — M86171 Other acute osteomyelitis, right ankle and foot: Secondary | ICD-10-CM | POA: Diagnosis not present

## 2020-09-01 DIAGNOSIS — W57XXXA Bitten or stung by nonvenomous insect and other nonvenomous arthropods, initial encounter: Secondary | ICD-10-CM | POA: Diagnosis not present

## 2020-09-01 DIAGNOSIS — B372 Candidiasis of skin and nail: Secondary | ICD-10-CM | POA: Diagnosis present

## 2020-09-01 DIAGNOSIS — S40862A Insect bite (nonvenomous) of left upper arm, initial encounter: Secondary | ICD-10-CM | POA: Diagnosis not present

## 2020-09-01 DIAGNOSIS — Z7189 Other specified counseling: Secondary | ICD-10-CM | POA: Diagnosis not present

## 2020-09-01 DIAGNOSIS — N1832 Chronic kidney disease, stage 3b: Secondary | ICD-10-CM | POA: Diagnosis not present

## 2020-09-01 DIAGNOSIS — E119 Type 2 diabetes mellitus without complications: Secondary | ICD-10-CM | POA: Diagnosis not present

## 2020-09-01 DIAGNOSIS — Z87891 Personal history of nicotine dependence: Secondary | ICD-10-CM | POA: Diagnosis not present

## 2020-09-01 DIAGNOSIS — I350 Nonrheumatic aortic (valve) stenosis: Secondary | ICD-10-CM | POA: Diagnosis present

## 2020-09-01 DIAGNOSIS — R7989 Other specified abnormal findings of blood chemistry: Secondary | ICD-10-CM | POA: Diagnosis not present

## 2020-09-01 DIAGNOSIS — G4700 Insomnia, unspecified: Secondary | ICD-10-CM | POA: Diagnosis present

## 2020-09-01 DIAGNOSIS — F05 Delirium due to known physiological condition: Secondary | ICD-10-CM | POA: Diagnosis present

## 2020-09-01 DIAGNOSIS — F419 Anxiety disorder, unspecified: Secondary | ICD-10-CM | POA: Diagnosis present

## 2020-09-01 DIAGNOSIS — Z515 Encounter for palliative care: Secondary | ICD-10-CM | POA: Diagnosis not present

## 2020-09-01 DIAGNOSIS — I340 Nonrheumatic mitral (valve) insufficiency: Secondary | ICD-10-CM | POA: Diagnosis not present

## 2020-09-01 DIAGNOSIS — R064 Hyperventilation: Secondary | ICD-10-CM | POA: Diagnosis present

## 2020-09-01 DIAGNOSIS — N289 Disorder of kidney and ureter, unspecified: Secondary | ICD-10-CM | POA: Diagnosis not present

## 2020-09-01 DIAGNOSIS — F0151 Vascular dementia with behavioral disturbance: Secondary | ICD-10-CM | POA: Diagnosis present

## 2020-09-01 DIAGNOSIS — R600 Localized edema: Secondary | ICD-10-CM | POA: Diagnosis not present

## 2020-09-01 DIAGNOSIS — R911 Solitary pulmonary nodule: Secondary | ICD-10-CM | POA: Diagnosis present

## 2020-09-01 DIAGNOSIS — R443 Hallucinations, unspecified: Secondary | ICD-10-CM | POA: Diagnosis not present

## 2020-09-01 LAB — CBC WITH DIFFERENTIAL/PLATELET
Abs Immature Granulocytes: 0.02 10*3/uL (ref 0.00–0.07)
Basophils Absolute: 0 10*3/uL (ref 0.0–0.1)
Basophils Relative: 1 %
Eosinophils Absolute: 0.2 10*3/uL (ref 0.0–0.5)
Eosinophils Relative: 3 %
HCT: 38.2 % (ref 36.0–46.0)
Hemoglobin: 11.8 g/dL — ABNORMAL LOW (ref 12.0–15.0)
Immature Granulocytes: 0 %
Lymphocytes Relative: 17 %
Lymphs Abs: 1 10*3/uL (ref 0.7–4.0)
MCH: 31.5 pg (ref 26.0–34.0)
MCHC: 30.9 g/dL (ref 30.0–36.0)
MCV: 101.9 fL — ABNORMAL HIGH (ref 80.0–100.0)
Monocytes Absolute: 0.7 10*3/uL (ref 0.1–1.0)
Monocytes Relative: 12 %
Neutro Abs: 4 10*3/uL (ref 1.7–7.7)
Neutrophils Relative %: 67 %
Platelets: 169 10*3/uL (ref 150–400)
RBC: 3.75 MIL/uL — ABNORMAL LOW (ref 3.87–5.11)
RDW: 14.5 % (ref 11.5–15.5)
WBC: 5.9 10*3/uL (ref 4.0–10.5)
nRBC: 0 % (ref 0.0–0.2)

## 2020-09-01 LAB — BASIC METABOLIC PANEL
Anion gap: 15 (ref 5–15)
BUN: 71 mg/dL — ABNORMAL HIGH (ref 8–23)
CO2: 22 mmol/L (ref 22–32)
Calcium: 9.3 mg/dL (ref 8.9–10.3)
Chloride: 99 mmol/L (ref 98–111)
Creatinine, Ser: 2.49 mg/dL — ABNORMAL HIGH (ref 0.44–1.00)
GFR, Estimated: 19 mL/min — ABNORMAL LOW (ref >60)
Glucose, Bld: 133 mg/dL — ABNORMAL HIGH (ref 70–99)
Potassium: 3.5 mmol/L (ref 3.5–5.1)
Sodium: 136 mmol/L (ref 135–145)

## 2020-09-01 LAB — LACTIC ACID, PLASMA: Lactic Acid, Venous: 1.2 mmol/L (ref 0.5–1.9)

## 2020-09-01 MED ORDER — BUSPIRONE HCL 5 MG PO TABS
5.0000 mg | ORAL_TABLET | Freq: Every day | ORAL | Status: DC | PRN
Start: 2020-09-01 — End: 2020-09-01
  Administered 2020-09-01: 5 mg via ORAL
  Filled 2020-09-01: qty 1

## 2020-09-01 MED ORDER — RISPERIDONE 0.5 MG PO TBDP
0.5000 mg | ORAL_TABLET | Freq: Once | ORAL | Status: AC
  Administered 2020-09-01: 0.5 mg via ORAL
  Filled 2020-09-01: qty 1

## 2020-09-01 MED ORDER — ACETAMINOPHEN 500 MG PO TABS
500.0000 mg | ORAL_TABLET | Freq: Three times a day (TID) | ORAL | Status: DC
  Administered 2020-09-01 – 2020-09-02 (×3): 500 mg via ORAL
  Filled 2020-09-01 (×3): qty 1

## 2020-09-01 MED ORDER — RAMELTEON 8 MG PO TABS
8.0000 mg | ORAL_TABLET | Freq: Every day | ORAL | Status: DC
  Administered 2020-09-01 – 2020-09-10 (×10): 8 mg via ORAL
  Filled 2020-09-01 (×11): qty 1

## 2020-09-01 MED ORDER — FUROSEMIDE 10 MG/ML IJ SOLN
120.0000 mg | Freq: Two times a day (BID) | INTRAVENOUS | Status: DC
  Administered 2020-09-01 – 2020-09-03 (×5): 120 mg via INTRAVENOUS
  Filled 2020-09-01 (×3): qty 10
  Filled 2020-09-01: qty 12
  Filled 2020-09-01: qty 10
  Filled 2020-09-01: qty 2
  Filled 2020-09-01: qty 12

## 2020-09-01 MED ORDER — BUSPIRONE HCL 5 MG PO TABS
5.0000 mg | ORAL_TABLET | Freq: Three times a day (TID) | ORAL | Status: DC
  Administered 2020-09-01 (×2): 5 mg via ORAL
  Filled 2020-09-01 (×2): qty 1

## 2020-09-01 MED ORDER — FUROSEMIDE 10 MG/ML IJ SOLN
80.0000 mg | Freq: Once | INTRAMUSCULAR | Status: AC
  Administered 2020-09-01: 80 mg via INTRAVENOUS
  Filled 2020-09-01: qty 8

## 2020-09-01 NOTE — Progress Notes (Addendum)
Spoke with patient's daughter Wonda Cerise over the phone to provide updates.  Daughter expressed frustration over the medical care she had been receiving for the past month in clinic and since being hospitalized.  Updated daughter that we are increasing the Lasix dose, trying ramelteon for sleep, continuing aripiprazole.  Daughter expressed concern that she has had very little to drink in the hospital.  Will discussed with nursing staff to try to encourage p.o. intake.  Explained to daughter that patient is clinically stable but overall prognosis remains poor.

## 2020-09-01 NOTE — Consult Note (Signed)
Cardiology Consultation:   Patient ID: Hannah [name] MRN: [id number]; DOB: Jan 01, 1946  Admit date: 08/30/2020 Date of Consult: 09/01/2020  PCP:  [name], MD   [facility] Providers Cardiologist:  Jenkins Rouge, MD        Patient Profile:   Hannah [name] is a 75 y.o. female with a hx of chronic combined systolic and diastolic heart failure (EF 20 to 25%), severe mitral regurgitation status post MitraClip 02/02/2019, dementia, GI bleeding, CKD  who is being seen 09/01/2020 for the evaluation of heart failure at the request of Dr. Thompson Grayer.  History of Present Illness:   Hannah [name] is a 75 year old female with a history of chronic combined systolic and diastolic heart failure (EF 20 to 25%), severe mitral regurgitation status post MitraClip 02/02/2019, dementia, GI bleeding, CKD who we are consulted to see for decompensated heart failure.  Patient lives with her daughter.  Had noted worsening lower extremity edema and saw her PCP.  Labs showed worsening renal function (creatinine 2.3 from 1.3 on 04/30/2020), so she was directly admitted on 5/13.  Vital signs showed stable BP 100/64, pulse 93, SPO2 96% on room air.  Labs notable for creatinine 2.5, sodium 136, potassium 4.4, BNP greater than 4500, WBC 6.1, troponin 72 > 77.  Chest x-ray shows mild CHF.  EKG shows sinus rhythm, rate 96, nonspecific intraventricular conduction delay.  Echocardiogram was attempted yesterday and there were limited images as patient was unable to cooperate with exam, but showed EF less than 20%, moderately reduced RV function, moderate to severe MR, mild to moderate MS. She was started on IV Lasix 40 mg then 80 mg with minimal urine output.   Past Medical History:  Diagnosis Date  . Anemia   . Anemia of chronic disease   . Anxiety   . Arthritis    "hands" (05/09/2015)  . Chronic diastolic heart failure ([place])   . CKD (chronic kidney disease) stage 3, GFR 30-59 ml/min (HCC)   . Dementia ([place])    . Elevated TSH 02/03/2018  . GERD (gastroesophageal reflux disease)   . HLD (hyperlipidemia)   . Hypertension   . LBBB (left bundle branch block)   . Mitral valve insufficiency   . Nonrheumatic mitral valve regurgitation 02/02/2019  . Osteoarthritis   . 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 S/P mitral valve repair    s/p MitraClip on 02/03/19  . Vitamin B12 deficiency 12/28/2018    Past Surgical History:  Procedure Laterality Date  . BIOPSY  10/28/2018   Procedure: BIOPSY;  Surgeon: Juanita Craver, MD;  Location: [facility] ENDOSCOPY;  Service: Endoscopy;;  . CARDIAC CATHETERIZATION  05/2004  . COLONOSCOPY  07/2002   Archie Endo 09/02/2010  . COLONOSCOPY WITH PROPOFOL N/A 10/28/2018   Procedure: COLONOSCOPY WITH PROPOFOL;  Surgeon: Juanita Craver, MD;  Location: [facility] ENDOSCOPY;  Service: Endoscopy;  Laterality: N/A;  . ENTEROSCOPY N/A 02/24/2019   Procedure: ENTEROSCOPY;  Surgeon: Carol Ada, MD;  Location: [place];  Service: Endoscopy;  Laterality: N/A;  . ENTEROSCOPY N/A 05/23/2019   Procedure: ENTEROSCOPY;  Surgeon: Carol Ada, MD;  Location: [facility] ENDOSCOPY;  Service: Endoscopy;  Laterality: N/A;  . ESOPHAGOGASTRODUODENOSCOPY  07/2002   w/biopsies/notes 09/02/2010  . ESOPHAGOGASTRODUODENOSCOPY (EGD) WITH PROPOFOL N/A 10/28/2018   Procedure: ESOPHAGOGASTRODUODENOSCOPY (EGD) WITH PROPOFOL;  Surgeon: Juanita Craver, MD;  Location: [facility] ENDOSCOPY;  Service: Endoscopy;  Laterality: N/A;  . GIVENS CAPSULE STUDY N/A 02/23/2019   Procedure: GIVENS CAPSULE STUDY;  Surgeon: Carol Ada, MD;  Location: [place];  Service:  Endoscopy;  Laterality: N/A;  . HOT HEMOSTASIS N/A 10/28/2018   Procedure: HOT HEMOSTASIS (ARGON PLASMA COAGULATION/BICAP);  Surgeon: Juanita Craver, MD;  Location: [facility] ENDOSCOPY;  Service: Endoscopy;  Laterality: N/A;  . HOT HEMOSTASIS N/A 02/24/2019   Procedure: HOT HEMOSTASIS (ARGON PLASMA COAGULATION/BICAP);  Surgeon: Carol Ada, MD;  Location: [place];  Service: Endoscopy;  Laterality: N/A;   . HOT HEMOSTASIS N/A 05/23/2019   Procedure: HOT HEMOSTASIS (ARGON PLASMA COAGULATION/BICAP);  Surgeon: Carol Ada, MD;  Location: [place];  Service: Endoscopy;  Laterality: N/A;  . IR ANGIOGRAM SELECTIVE EACH ADDITIONAL VESSEL  10/30/2018  . IR ANGIOGRAM SELECTIVE EACH ADDITIONAL VESSEL  10/30/2018  . IR ANGIOGRAM SELECTIVE EACH ADDITIONAL VESSEL  10/30/2018  . IR ANGIOGRAM SELECTIVE EACH ADDITIONAL VESSEL  10/30/2018  . IR ANGIOGRAM VISCERAL SELECTIVE  10/30/2018  . IR EMBO ART  VEN HEMORR LYMPH EXTRAV  INC GUIDE ROADMAPPING  10/30/2018  . IR US GUIDANCE  10/30/2018  . MITRAL VALVE REPAIR N/A 02/02/2019   Procedure: MITRAL VALVE REPAIR;  Surgeon: Sherren Mocha, MD;  Location: [place] CV LAB;  Service: Cardiovascular;  Laterality: N/A;  . POLYPECTOMY  10/28/2018   Procedure: POLYPECTOMY;  Surgeon: Juanita Craver, MD;  Location: [facility] ENDOSCOPY;  Service: Endoscopy;;  . RIGHT/LEFT HEART CATH AND CORONARY ANGIOGRAPHY N/A 12/29/2018   Procedure: RIGHT/LEFT HEART CATH AND CORONARY ANGIOGRAPHY;  Surgeon: Leonie Man, MD;  Location: [place] CV LAB;  Service: Cardiovascular;  Laterality: N/A;  . TEE WITHOUT CARDIOVERSION N/A 12/30/2018   Procedure: TRANSESOPHAGEAL ECHOCARDIOGRAM (TEE);  Surgeon: Lelon Perla, MD;  Location: [facility] ENDOSCOPY;  Service: Cardiovascular;  Laterality: N/A;      Inpatient Medications: Scheduled Meds: . acetaminophen  500 mg Oral TID  . ARIPiprazole  5 mg Oral Daily  . atorvastatin  80 mg Oral Daily  . busPIRone  5 mg Oral TID  . cephALEXin  500 mg Oral Q8H  . enoxaparin (LOVENOX) injection  30 mg Subcutaneous Q24H  . metoprolol succinate  50 mg Oral Daily  . nystatin ointment   Topical BID  . pantoprazole  40 mg Oral Daily  . polyethylene glycol  17 g Oral Daily  . ramelteon  8 mg Oral QHS  . sodium chloride flush  3 mL Intravenous Q12H   Continuous Infusions: . sodium chloride     PRN Meds: sodium chloride, sodium chloride flush  Allergies:     Allergies  Allergen Reactions  . Plavix [Clopidogrel] Other (See Comments)    GI bleed from jejunal angiodysplastic lesions in context of Plavix and aspirin dual therapy    Social History:   Social History   Socioeconomic History  . Marital status: Widowed    Spouse name: Chrissie Noa  . Number of children: 4  . Years of education: 53  . Highest education level: High school graduate  Occupational History  . Occupation: Retired     Comment: Press photographer  Tobacco Use  . Smoking status: Former Smoker    Packs/day: 0.30    Years: 8.00    Pack years: 2.40    Types: Cigarettes    Quit date: 05/30/2004    Years since quitting: 16.2  . Smokeless tobacco: Never Used  Vaping Use  . Vaping Use: Never used  Substance and Sexual Activity  . Alcohol use: No    Alcohol/week: 0.0 standard drinks  . Drug use: Never  . Sexual activity: Not Currently  Other Topics Concern  . Not on file  Social History Narrative  Emergency Contact: [name] [phone]   End of Life Plan: gave pt ad pamplet   Any pets: 1 Dog    Diet: pt has a varied diet, low consumption of carbs and fatty foods    Exercise: pt does not have a regular exercise routine    Seatbelts: pt wears seat regularly in car   Hobbies: spending time with grandchildren      *Updated 09/26/2018*   Patients daughter [name] lives with her and assists her in driving to doctors apts and errands. Patient does have a renewed drivers licenses, but feels more comfortable riding with her daughter. Patient enjoys going out to eat and spending time with her grandchildren who live in [place]. Patient stated they come during the summer to see her. Patient is excited her son, Vicente Males, is about to finish school to become a Theme park manager.    The patient has great family support system.    Social Determinants of Health   Financial Resource Strain: Not on file  Food Insecurity: Not on file  Transportation Needs: Not on file  Physical Activity: Not on file  Stress:  Not on file  Social Connections: Not on file  Intimate Partner Violence: Not on file    Family History:    Family History  Problem Relation Age of Onset  . Heart disease Mother        died at age 16  . Breast cancer Sister   . Cancer Sister        Breast  . Hypertension Father   . Hypertension Sister      ROS:  Please see the history of present illness.   All other ROS reviewed and negative.     Physical Exam/Data:   Vitals:   08/31/20 1547 08/31/20 2004 09/01/20 0631 09/01/20 0700  BP: 97/80 128/61  106/74  Pulse: 94 100  (!) 105  Resp: 20 16  16   Temp: (!) 97.5 F (36.4 C) 97.9 F (36.6 C)  97.7 F (36.5 C)  TempSrc: Oral Oral  Oral  SpO2: 99% 91%  99%  Weight:   79.4 kg     Intake/Output Summary (Last 24 hours) at 09/01/2020 1209 Last data filed at 08/31/2020 2300 Gross per 24 hour  Intake 120 ml  Output 100 ml  Net 20 ml   Last 3 Weights 09/01/2020 08/31/2020 08/30/2020  Weight (lbs) 175 lb 0.7 oz 172 lb 9.9 oz 174 lb 0.2 oz  Weight (kg) 79.4 kg 78.3 kg 78.93 kg     Body mass index is 32.02 kg/m.  General: in no acute distress HEENT: normal Neck: +JVD Cardiac:  normal S1, S2; RRR; 2/6 systolic murmur Lungs:  Diminished breath sounds Abd: soft, nontender Ext: 2+ BLE edema Musculoskeletal:  No deformities, BUE and BLE strength normal and equal Skin: warm and dry  Neuro:  Oriented to person, knew year but not month. No focal abnormalities noted Psych:  Normal affect   EKG:  The EKG was personally reviewed and demonstrates:  EKG shows sinus rhythm, rate 96, nonspecific intraventricular conduction delay.  Telemetry:  Telemetry was personally reviewed and demonstrates: Normal sinus rhythm, rate 90s to 110s  Relevant CV Studies: Echo 5/14: 1. Limited exam as patient unable to cooperate.  2. Left ventricular ejection fraction, by estimation, is <20%. The left  ventricle has severely decreased function. The left ventricle demonstrates  global hypokinesis.  The left ventricular internal cavity size was severely  dilated. There is mild left  ventricular hypertrophy. Left ventricular diastolic  function could not be  evaluated.  3. Right ventricular systolic function is moderately reduced. The right  ventricular size is moderately enlarged.  4. There is a Mitra-Clip present in the mitral position. Mild to moderate  mitral valve stenosis, MG 6 mmHg at HR 98 bpm which is stable from prior  echo and likely partly elevated due to significant MR. Moderate to severe  mitral valve regurgitation.  Limited images, but MR appears moderate to severe.  5. The aortic valve was not well visualized. Aortic valve regurgitation  is not visualized. AV gradients were not assessed to evaluate for AS.  Laboratory Data:  High Sensitivity Troponin:   Recent Labs  Lab 08/31/20 0652 08/31/20 0840  TROPONINIHS 72* 77*     Chemistry Recent Labs  Lab 08/31/20 0212 08/31/20 2232 09/01/20 0159  NA 136 138 136  K 4.4 4.1 3.5  CL 95* 99 99  CO2 27 26 22   GLUCOSE 159* 147* 133*  BUN 69* 72* 71*  CREATININE 2.52* 2.72* 2.49*  CALCIUM 9.5 9.3 9.3  GFRNONAA 19* 17* 19*  ANIONGAP 14 13 15     No results for input(s): PROT, ALBUMIN, AST, ALT, ALKPHOS, BILITOT in the last 168 hours. Hematology Recent Labs  Lab 08/29/20 1653 08/31/20 0212 09/01/20 0159  WBC 6.9 6.1 5.9  RBC 4.05 3.97 3.75*  HGB 12.7 12.7 11.8*  HCT 40.1 40.1 38.2  MCV 99* 101.0* 101.9*  MCH 31.4 32.0 31.5  MCHC 31.7 31.7 30.9  RDW 13.3 14.6 14.5  PLT 191 196 169   BNP Recent Labs  Lab 08/31/20 0212  BNP >4,500.0*    DDimer No results for input(s): DDIMER in the last 168 hours.   Radiology/Studies:  DG Chest 2 View  Result Date: 08/31/2020 CLINICAL DATA:  Leg edema and heart failure EXAM: CHEST - 2 VIEW COMPARISON:  10/02/2019 FINDINGS: Cardiomegaly. Left ventricular implant. Interstitial coarsening and trace pleural fluid. No air bronchogram. No pneumothorax. IMPRESSION:  Mild CHF. Electronically Signed   By: Monte Fantasia M.D.   On: 08/31/2020 08:16   ECHOCARDIOGRAM COMPLETE  Result Date: 08/31/2020    ECHOCARDIOGRAM REPORT   Patient Name:   Hannah [name] Date of Exam: 08/31/2020 Medical Rec #:  [id number]         Height:       62.0 in Accession #:    [id number]        Weight:       172.6 lb Date of Birth:  1945-09-18         BSA:          1.796 m Patient Age:    62 years          BP:           91/56 mmHg Patient Gender: F                 HR:           85 bpm. Exam Location:  Inpatient Procedure: 2D Echo, Cardiac Doppler and Color Doppler Indications:    CHF-Acute Systolic  History:        Patient has prior history of Echocardiogram examinations, most                 recent 01/24/2020. Mitral Valve Disease and Aortic Valve Disease;                 Risk Factors:Hypertension. S/P mitral valve repair. Single NTR  clip 01/2019. CKD.  Sonographer:    Clayton Lefort RDCS (AE) Referring Phys: [id number] MARGARET E PRAY  Sonographer Comments: Image acquisition challenging due to uncooperative patient. Unable to complete echocardiogram due to lack of cooperation from patient. IMPRESSIONS  1. Limited exam as patient unable to cooperate.  2. Left ventricular ejection fraction, by estimation, is <20%. The left ventricle has severely decreased function. The left ventricle demonstrates global hypokinesis. The left ventricular internal cavity size was severely dilated. There is mild left ventricular hypertrophy. Left ventricular diastolic function could not be evaluated.  3. Right ventricular systolic function is moderately reduced. The right ventricular size is moderately enlarged.  4. There is a Mitra-Clip present in the mitral position. Mild to moderate mitral valve stenosis, MG 6 mmHg at HR 98 bpm which is stable from prior echo and likely partly elevated due to significant MR. Moderate to severe mitral valve regurgitation. Limited images, but MR appears moderate to severe.  5.  The aortic valve was not well visualized. Aortic valve regurgitation is not visualized. AV gradients were not assessed to evaluate for AS. FINDINGS  Left Ventricle: Left ventricular ejection fraction, by estimation, is <20%. The left ventricle has severely decreased function. The left ventricle demonstrates global hypokinesis. The left ventricular internal cavity size was severely dilated. There is mild left ventricular hypertrophy. Left ventricular diastolic function could not be evaluated. Right Ventricle: The right ventricular size is moderately enlarged. Right vetricular wall thickness was not well visualized. Right ventricular systolic function is moderately reduced. Left Atrium: Left atrial size was not well visualized. Right Atrium: Right atrial size was not well visualized. Pericardium: There is no evidence of pericardial effusion. Mitral Valve: The mitral valve has been repaired/replaced. Moderate to severe mitral valve regurgitation. There is a Mitra-Clip present in the mitral position. Mild to moderate mitral valve stenosis. Tricuspid Valve: The tricuspid valve is normal in structure. Tricuspid valve regurgitation is trivial. Aortic Valve: The aortic valve was not well visualized. Aortic valve regurgitation is not visualized. Pulmonic Valve: The pulmonic valve was not well visualized. Pulmonic valve regurgitation is not visualized. Aorta: The aortic root and ascending aorta are structurally normal, with no evidence of dilitation. IAS/Shunts: The interatrial septum was not assessed.  LEFT VENTRICLE PLAX 2D LVIDd:         6.00 cm LVIDs:         5.70 cm LV PW:         1.20 cm LV IVS:        0.80 cm LVOT diam:     2.00 cm LVOT Area:     3.14 cm  LEFT ATRIUM         Index LA diam:    4.80 cm 2.67 cm/m   AORTA Ao Root diam: 2.70 cm Ao Asc diam:  3.20 cm TRICUSPID VALVE TR Peak grad:   13.5 mmHg TR Vmax:        184.00 cm/s  SHUNTS Systemic Diam: 2.00 cm Oswaldo Milian MD Electronically signed by  Oswaldo Milian MD Signature Date/Time: 08/31/2020/2:23:55 PM    Final      Assessment and Plan:   Acute on chronic combined systolic and diastolic heart failure: Presented with volume overload, BNP greater than 4500.  Echocardiogram was attempted 5/14 and there were limited images as patient was unable to cooperate with exam, but showed EF less than 20%, moderately reduced RV function, moderate to severe MR, mild to moderate MS. -She was given a dose of IV Lasix 40 mg yesterday  and then increased to 80 mg this morning.  Recommend increasing to 120 mg twice daily. -Hold metoprolol for now in setting of possible low output state, though she does not appear cold on exam.  Will check lactate -Unable to start GDMT given soft BP and AKI -Given her comorbidities including dementia, she is not a good candidate for advanced therapies.  Agree with palliative care consult   AKI: Creatinine up to 2.7 (significantly increased from 1.3 in January.  Concerned for cardiorenal syndrome, would continue diuresis and closely monitor kidney function  Troponin elevation: Peak troponin 77, likely demand ischemia in setting of decompensated heart failure as above    For questions or updates, please contact [facility] Please consult [email or web address] for contact info under    Signed, Donato Heinz, MD  09/01/2020 12:09 PM

## 2020-09-01 NOTE — Progress Notes (Signed)
Pt  Is becoming increasingly agitated and becoming verbally aggressive to RN and nursing staff

## 2020-09-01 NOTE — Progress Notes (Signed)
FPTS Interim Progress Note  S: Informed by RN that patient becoming more agitated and verbally aggressive to staff.  Went to bedside to evaluate patient.  Patient kept screaming "help me" and "I can't breathe".  Attempted to redirect patient but was unsuccessful.  During encounter, patient got up and sat on the edge of the bed stating that she needed to go home and attempted to get out of bed.  RN stated in her way and patient yelled at him to "move."  Sitter was present until 3:00 PM.  Now with no sitter, nursing staff has had to spend a significant amount of time watching patient.  O: BP 106/74   Pulse (!) 105   Temp 97.7 F (36.5 C) (Oral)   Resp 16   Wt 79.4 kg   SpO2 99%   BMI 32.02 kg/m   General: Visibly anxious and agitated elderly female Respiratory: Intermittently hyperventilating but with normal work of breathing Psych: Psychomotor agitation, unable to be redirected  A/P: Agitation increased from earlier in the day, now unable to be redirected and becoming more verbally aggressive to staff.  Attempted to repeat EKG but patient refused.  EKG from this morning with prolonged QTc 508.  Discussed with attending Dr. Thompson Grayer. - risperidone disintegrating tablet 0.5 mg once (lower risk of QTc prolongation), can attempt giving with ice cream with needed and can consider re-dosing tonight if needed - can consider IV olanzapine if refusing tablet (though has been compliant with her other medications)  Zola Button, MD 09/01/2020, 5:40 PM PGY-1, [place] Service pager [phone]

## 2020-09-01 NOTE — Progress Notes (Signed)
Pt refusing tele.

## 2020-09-01 NOTE — Progress Notes (Addendum)
   Palliative Medicine Inpatient Follow Up Note  Reason for consult:  Goals of Care "Goals of care discussion, worsening dementia"  HPI:  Per intake H&P --> Hannah [name] a 75 y.o.femalepresenting with BLE edema, AKI, right 4th toe redness. PMH is significant forchronic HFrEF (last Echo 01/24/20- EF 20-25%), HTN, Dementia, GI bleed s/p mesenteric artery coil embolization, anxiety, severe mitral regurgitation s/p MitraClip (02/02/19), symptomatic anemia, insomnia.  Palliative care has been asked to get involved to aid in goals of care conversations in the setting of progressing dementia.   Today's Discussion (09/01/2020):  *Please note that this is a verbal dictation therefore any spelling or grammatical errors are due to the "[employer]" system interpretation.  Chart reviewed.  I spoke to patients night RN this morning. He shares that [name] slept throughout the night. She was less agitated.  I met at bedside with Hannah [name] this morning. She was in good spirits and endorsed that she slept well. She did not remember why she was in the hospital therefore I reoriented her. She shares with me that she "has to pee". I helped Hannah [name] get to the bedside commode. She was able to mobilize well without assistance. Encouraged her to sit up thereafter.   Spoke to patients daughter, Hannah [name]. She is frustrated with the patients lack of sleep in the setting of her dementia. Reviewed the severity of patient hospital associate delirium. Hannah [name] shares she also worried her mother has chronic pain and cannot "tell us" due to her dementia. Discussed starting her on some standing tylenol for this. Further discussed changing medications to aid in delirium management and insomnia through the help of the pharmacy team.   Questions and concerns addressed   Objective Assessment: Vital Signs Vitals:   08/31/20 2004 09/01/20 0700  BP: 128/61 106/74  Pulse: 100 (!) 105  Resp: 16 16  Temp: 97.9 F  (36.6 C) 97.7 F (36.5 C)  SpO2: 91% 99%    Intake/Output Summary (Last 24 hours) at 09/01/2020 4709 Last data filed at 08/31/2020 2300 Gross per 24 hour  Intake 120 ml  Output 100 ml  Net 20 ml   Last Weight  Most recent update: 09/01/2020  6:32 AM   Weight  79.4 kg (175 lb 0.7 oz)           Gen: Robust elderly Caucasian female in no acute distress HEENT: moist mucous membranes CV: Regular rate and rhythm PULM: On room air ABD: soft/nontender EXT: Bilateral lower extremity +2 pitting edema Neuro: Alert and oriented x2  SUMMARY OF RECOMMENDATIONS Full Code - Patients daughter would like the rest of the family to discuss this prior to making a decision  Insomnia: - Will add Rozerem QHS  Delirium: - Strict delirium precautions, Buspar 5mg  PO TID, continue Abilify  Generalized Pain: - Tylenol TID  Appreciate pharmacy consult  TOC - OP Palliative care  Ongoing incremental palliative support in the oncoming days  Time Spent: 35 Greater than 50% of the time was spent in counseling and coordination of care ______________________________________________________________________________________ [place] Team Team Cell Phone: [phone] Please utilize secure chat with additional questions, if there is no response within 30 minutes please call the above phone number  Palliative Medicine Team providers are available by phone from 7am to 7pm daily and can be reached through the team cell phone.  Should this patient require assistance outside of these hours, please call the patient's attending physician.

## 2020-09-01 NOTE — Progress Notes (Signed)
Pt actively screaming down the hall "god help me im so nervous I dont know what to do" "I cant breathe I think im going to pass out" "please give me a shot to sedate me I dont know what to do"  MD notified multiple times  Pt tries to get up from chair and leave stating"she needs to get out of here"  Pt becoming increasingly more agitated even with staff in room.  MD aware   No new orders at this time

## 2020-09-01 NOTE — Progress Notes (Addendum)
Family Medicine Teaching Service Daily Progress Note Intern Pager: [phone]  [name] Medical record number: [id number] Date of birth: Mar 25, 1946 Age: 75 y.o. Gender: female  Primary Care Provider: [name], MD Consultants: Cardiology, palliative care   Code Status: Full Code  Pt Overview and Major Events to Date:  5/13 admitted  Assessment and Plan: Hannah [name] is a 75 y.o. female who presents with bilateral lower extremity edema secondary to HFrEF exacerbation. PMHx significant for HFrEF, severe MVR (s/p MitraClip 01/2019), dementia, GI bleed s/p mesenteric artery coil embolization, symptomatic anemia, insomnia.  HFrEF exacerbation Yesterday received oral furosemide 80 mg and IV furosemide 40 mg with very minimal urine output.  TTE yesterday revealing worsening EF less than 20% (though was a poor study).  Still breathing comfortably on room air, but still volume overloaded on exam with 2+ pitting edema.  Will get cardiology involved today. - cardiology consulted, appreciate recommendations - s/p IV furosemide 80 mg this AM - continue home metoprolol succinate 50 mg - strict I/O, daily weights  Dementia with delirium With noted agitation and anxiety with reported hallucinations.  Had some noted agitation yesterday afternoon where patient was pulling at lines and restless, trying to get out of bed but was able to be redirected.  Patient had a telemetry sitter overnight without any issues.  Patient was notably agitated and anxious this morning. Apparently did not sleep well last night.  I evaluated patient two separate times this morning during which patient was feeling very anxious (patient reported feeling nervous and felt she could not breathe) and was able to redirect and calm patient down with conversation. 1:1 sitter currently in place. Buspirone re-ordered and given this AM with some improvement. - palliative care following, appreciate  involvement with Cornelius discussion - continue 1:1 sitter, redirect if possible - continue aripriprazole 5 mg daily, consider switching to qhs dosing tomorrow - buspirone 5 mg TID (previously taking once daily prn at home) - consider MRI pending palliative discussion given daughter's report of intermittent slurring of speech during some of the hallucination episodes - trial ramelteon 8 mg qhs - avoid antipsychotics and/or benzodiazepines if possible given prolonged QTc (QTc 508 this morning) and age - delirium precautions  Cellulitis of right 4th toe Still erythematous and swollen.  Remains afebrile and without leukocytosis.  Antibiotics initiated yesterday. - cephalexin 500 mg q8h (5/14-) - consider colchicine if no improvement with antibiotics (though would be careful given AKI)  AKI on CKD stage IIIb Baseline creatinine around 1.3-1.5.  Suspect cardiorenal from volume overload.  No significant change, creatinine 2.49 this morning. - diuresis as above  Mitral valve regurgitation Patient is s/p MitraClip 02/03/2019.  TTE this admission revealing moderate to severe mitral valve regurgitation, MitraClip is in place.  Underlying valve disorder possibly contributing to CHF.  Anxiety - buspirone as above  Insomnia Likely related to dementia. Trazodone discontinued due to concerns of hallucinations. - ramelteon as above  GERD - continue PPI  OA Lumbar spine x-ray on 08/26/2020 shows multilevel disc osteophytic disease with no acute findings and no significant changes from CT 2020. - scheduled APAP 500 mg TID   HTN Normotensive to soft. - continue home metoprolol succinate as above  HLD - continue home statin  History of GI bleed Mild drop in Hgb from 12.7 to 11.8 this AM. On ferrous sulfate 324 mg daily at home but reportedly not taking. - monitor on CBC  Incidental pulmonary nodule Seen on CTA 09/2019.  -  repeat outpatient June 2022  [facility] Intertrigo Noted on admission  exam under breasts and in groin - nystatin ointment BID  FEN/GI: heart healthy PPx: LMWH  Disposition: med-tele  Subjective:  NAOE.  Patient had a telemetry sitter overnight without any acute events.  Apparently patient did not sleep well last night despite getting melatonin.  This morning, patient was notably agitated and anxious.  Patient repeatedly stating that she "could not breathe" and that she was really worried and demanded a shot.  She was able to calm down with some reassurance.  She was agreeable to trying the buspirone and see if that helps.  Later in the morning, patient continued to have anxiety and expressed significant worry.  Reassurance provided to patient and she was able to calm down.  Objective: Temp:  [97.3 F (36.3 C)-97.9 F (36.6 C)] 97.7 F (36.5 C) (05/15 0700) Pulse Rate:  [86-105] 105 (05/15 0700) Resp:  [16-20] 16 (05/15 0700) BP: (97-128)/(61-80) 106/74 (05/15 0700) SpO2:  [91 %-99 %] 99 % (05/15 0700) Weight:  [79.4 kg] 79.4 kg (05/15 0631) Physical Exam: General: Obese elderly female, visibly anxious and hyperventilating initially but able to be calmed down with reassurance and distraction Cardiovascular: RRR, no murmurs Respiratory: Clear to auscultation bilaterally, initially tachypneic but was able to calm down Abdomen: Soft, nontender, positive bowel sounds Extremities: Warm and well perfused, 2+ pitting edema up to knees bilaterally, right fourth toe erythematous and edematous, see image below: Neuro: A&Ox3, though intermittently confused making untrue statements (had mentioned needing to go back to work tomorrow)     Laboratory: Recent Labs  Lab 08/29/20 1653 08/31/20 0212 09/01/20 0159  WBC 6.9 6.1 5.9  HGB 12.7 12.7 11.8*  HCT 40.1 40.1 38.2  PLT 191 196 169   Recent Labs  Lab 08/31/20 0212 08/31/20 2232 09/01/20 0159  NA 136 138 136  K 4.4 4.1 3.5  CL 95* 99 99  CO2 27 26 22   BUN 69* 72* 71*  CREATININE 2.52* 2.72* 2.49*   CALCIUM 9.5 9.3 9.3  GLUCOSE 159* 147* 133*   EKG: QTc 508  Imaging/Diagnostic Tests: ECHOCARDIOGRAM COMPLETE  Result Date: 08/31/2020    ECHOCARDIOGRAM REPORT   Patient Name:   Hannah [name] Date of Exam: 08/31/2020 Medical Rec #:  [id number]         Height:       62.0 in Accession #:    [id number]        Weight:       172.6 lb Date of Birth:  March 19, 1946         BSA:          1.796 m Patient Age:    72 years          BP:           91/56 mmHg Patient Gender: F                 HR:           85 bpm. Exam Location:  Inpatient Procedure: 2D Echo, Cardiac Doppler and Color Doppler Indications:    CHF-Acute Systolic  History:        Patient has prior history of Echocardiogram examinations, most                 recent 01/24/2020. Mitral Valve Disease and Aortic Valve Disease;                 Risk Factors:Hypertension. S/P mitral valve repair.  Single NTR                 clip 01/2019. CKD.  Sonographer:    Clayton Lefort RDCS (AE) Referring Phys: [id number] MARGARET E PRAY  Sonographer Comments: Image acquisition challenging due to uncooperative patient. Unable to complete echocardiogram due to lack of cooperation from patient. IMPRESSIONS  1. Limited exam as patient unable to cooperate.  2. Left ventricular ejection fraction, by estimation, is <20%. The left ventricle has severely decreased function. The left ventricle demonstrates global hypokinesis. The left ventricular internal cavity size was severely dilated. There is mild left ventricular hypertrophy. Left ventricular diastolic function could not be evaluated.  3. Right ventricular systolic function is moderately reduced. The right ventricular size is moderately enlarged.  4. There is a Mitra-Clip present in the mitral position. Mild to moderate mitral valve stenosis, MG 6 mmHg at HR 98 bpm which is stable from prior echo and likely partly elevated due to significant MR. Moderate to severe mitral valve regurgitation. Limited images, but MR appears moderate to  severe.  5. The aortic valve was not well visualized. Aortic valve regurgitation is not visualized. AV gradients were not assessed to evaluate for AS. FINDINGS  Left Ventricle: Left ventricular ejection fraction, by estimation, is <20%. The left ventricle has severely decreased function. The left ventricle demonstrates global hypokinesis. The left ventricular internal cavity size was severely dilated. There is mild left ventricular hypertrophy. Left ventricular diastolic function could not be evaluated. Right Ventricle: The right ventricular size is moderately enlarged. Right vetricular wall thickness was not well visualized. Right ventricular systolic function is moderately reduced. Left Atrium: Left atrial size was not well visualized. Right Atrium: Right atrial size was not well visualized. Pericardium: There is no evidence of pericardial effusion. Mitral Valve: The mitral valve has been repaired/replaced. Moderate to severe mitral valve regurgitation. There is a Mitra-Clip present in the mitral position. Mild to moderate mitral valve stenosis. Tricuspid Valve: The tricuspid valve is normal in structure. Tricuspid valve regurgitation is trivial. Aortic Valve: The aortic valve was not well visualized. Aortic valve regurgitation is not visualized. Pulmonic Valve: The pulmonic valve was not well visualized. Pulmonic valve regurgitation is not visualized. Aorta: The aortic root and ascending aorta are structurally normal, with no evidence of dilitation. IAS/Shunts: The interatrial septum was not assessed.  LEFT VENTRICLE PLAX 2D LVIDd:         6.00 cm LVIDs:         5.70 cm LV PW:         1.20 cm LV IVS:        0.80 cm LVOT diam:     2.00 cm LVOT Area:     3.14 cm  LEFT ATRIUM         Index LA diam:    4.80 cm 2.67 cm/m   AORTA Ao Root diam: 2.70 cm Ao Asc diam:  3.20 cm TRICUSPID VALVE TR Peak grad:   13.5 mmHg TR Vmax:        184.00 cm/s  SHUNTS Systemic Diam: 2.00 cm Oswaldo Milian MD Electronically signed  by Oswaldo Milian MD Signature Date/Time: 08/31/2020/2:23:55 PM    Final      Zola Button, MD 09/01/2020, 8:41 AM PGY-1, [place] Intern pager: [phone], text pages welcome

## 2020-09-02 ENCOUNTER — Other Ambulatory Visit (HOSPITAL_COMMUNITY): Payer: Self-pay

## 2020-09-02 DIAGNOSIS — R600 Localized edema: Secondary | ICD-10-CM | POA: Diagnosis not present

## 2020-09-02 DIAGNOSIS — I340 Nonrheumatic mitral (valve) insufficiency: Secondary | ICD-10-CM | POA: Diagnosis not present

## 2020-09-02 DIAGNOSIS — Z515 Encounter for palliative care: Secondary | ICD-10-CM | POA: Diagnosis not present

## 2020-09-02 DIAGNOSIS — I5043 Acute on chronic combined systolic (congestive) and diastolic (congestive) heart failure: Secondary | ICD-10-CM | POA: Diagnosis not present

## 2020-09-02 DIAGNOSIS — I5023 Acute on chronic systolic (congestive) heart failure: Secondary | ICD-10-CM | POA: Diagnosis not present

## 2020-09-02 DIAGNOSIS — W57XXXA Bitten or stung by nonvenomous insect and other nonvenomous arthropods, initial encounter: Secondary | ICD-10-CM

## 2020-09-02 DIAGNOSIS — S40862A Insect bite (nonvenomous) of left upper arm, initial encounter: Secondary | ICD-10-CM | POA: Diagnosis present

## 2020-09-02 DIAGNOSIS — Z7189 Other specified counseling: Secondary | ICD-10-CM | POA: Diagnosis not present

## 2020-09-02 LAB — CBC
HCT: 38.6 % (ref 36.0–46.0)
Hemoglobin: 12.1 g/dL (ref 12.0–15.0)
MCH: 31.3 pg (ref 26.0–34.0)
MCHC: 31.3 g/dL (ref 30.0–36.0)
MCV: 100 fL (ref 80.0–100.0)
Platelets: 179 10*3/uL (ref 150–400)
RBC: 3.86 MIL/uL — ABNORMAL LOW (ref 3.87–5.11)
RDW: 14.5 % (ref 11.5–15.5)
WBC: 4.8 10*3/uL (ref 4.0–10.5)
nRBC: 0 % (ref 0.0–0.2)

## 2020-09-02 LAB — BASIC METABOLIC PANEL
Anion gap: 12 (ref 5–15)
BUN: 68 mg/dL — ABNORMAL HIGH (ref 8–23)
CO2: 28 mmol/L (ref 22–32)
Calcium: 9 mg/dL (ref 8.9–10.3)
Chloride: 98 mmol/L (ref 98–111)
Creatinine, Ser: 2.45 mg/dL — ABNORMAL HIGH (ref 0.44–1.00)
GFR, Estimated: 20 mL/min — ABNORMAL LOW (ref >60)
Glucose, Bld: 147 mg/dL — ABNORMAL HIGH (ref 70–99)
Potassium: 3.6 mmol/L (ref 3.5–5.1)
Sodium: 138 mmol/L (ref 135–145)

## 2020-09-02 MED ORDER — LIDOCAINE 5 % EX PTCH
2.0000 | MEDICATED_PATCH | CUTANEOUS | Status: DC
  Administered 2020-09-02 – 2020-09-09 (×7): 2 via TRANSDERMAL
  Filled 2020-09-02 (×8): qty 2

## 2020-09-02 MED ORDER — MAGNESIUM SULFATE 2 GM/50ML IV SOLN
2.0000 g | Freq: Once | INTRAVENOUS | Status: AC
  Administered 2020-09-02: 2 g via INTRAVENOUS
  Filled 2020-09-02: qty 50

## 2020-09-02 MED ORDER — ACETAMINOPHEN 500 MG PO TABS
1000.0000 mg | ORAL_TABLET | Freq: Four times a day (QID) | ORAL | Status: DC | PRN
  Administered 2020-09-02 – 2020-09-04 (×6): 1000 mg via ORAL
  Filled 2020-09-02 (×7): qty 2

## 2020-09-02 MED ORDER — TORSEMIDE 100 MG PO TABS
100.0000 mg | ORAL_TABLET | Freq: Once | ORAL | Status: AC
  Administered 2020-09-02: 100 mg via ORAL
  Filled 2020-09-02: qty 1

## 2020-09-02 MED ORDER — DIVALPROEX SODIUM 125 MG PO CSDR
125.0000 mg | DELAYED_RELEASE_CAPSULE | Freq: Three times a day (TID) | ORAL | Status: DC
  Administered 2020-09-02 – 2020-09-05 (×12): 125 mg via ORAL
  Filled 2020-09-02 (×13): qty 1

## 2020-09-02 MED ORDER — MELATONIN 5 MG PO TABS: 10.0000 mg | ORAL_TABLET | Freq: Every day | ORAL | Status: DC

## 2020-09-02 MED ORDER — DOXYCYCLINE HYCLATE 100 MG PO TABS
200.0000 mg | ORAL_TABLET | Freq: Once | ORAL | Status: AC
  Administered 2020-09-02: 200 mg via ORAL
  Filled 2020-09-02: qty 2

## 2020-09-02 MED ORDER — DIVALPROEX SODIUM 125 MG PO CSDR: 125.0000 mg | DELAYED_RELEASE_CAPSULE | Freq: Three times a day (TID) | ORAL | Status: DC

## 2020-09-02 NOTE — Progress Notes (Signed)
Progress Note  [name] Date of Encounter: 09/02/2020  [facility] Cardiologist: Jenkins Rouge, MD   Subjective   Very pleasant, optimistic positive outlook with me. Eating lunch, comfortable  Cooperative with exam.  Inpatient Medications    Scheduled Meds: . ARIPiprazole  5 mg Oral Daily  . atorvastatin  80 mg Oral Daily  . cephALEXin  500 mg Oral Q8H  . divalproex  125 mg Oral TID  . enoxaparin (LOVENOX) injection  30 mg Subcutaneous Q24H  . lidocaine  2 patch Transdermal Q24H  . nystatin ointment   Topical BID  . pantoprazole  40 mg Oral Daily  . polyethylene glycol  17 g Oral Daily  . ramelteon  8 mg Oral QHS  . sodium chloride flush  3 mL Intravenous Q12H   Continuous Infusions: . sodium chloride    . furosemide 120 mg (09/02/20 0828)   PRN Meds: sodium chloride, acetaminophen, sodium chloride flush   Vital Signs    Vitals:   09/01/20 1145 09/01/20 1910 09/02/20 0300 09/02/20 0900  BP: 108/76 (!) 91/47 105/66   Pulse: 99 99 96   Resp: 18 16 18    Temp: 97.8 F (36.6 C) 97.7 F (36.5 C) 97.7 F (36.5 C)   TempSrc: Oral Oral Oral   SpO2: 99% 96% 98%   Weight:   78.1 kg   Height:    5\' 2"  (1.575 m)    Intake/Output Summary (Last 24 hours) at 09/02/2020 1525 Last data filed at 09/02/2020 1355 Gross per 24 hour  Intake 601.29 ml  Output 1011 ml  Net -409.71 ml   Last 3 Weights 09/02/2020 09/01/2020 08/31/2020  Weight (lbs) 172 lb 2.9 oz 175 lb 0.7 oz 172 lb 9.9 oz  Weight (kg) 78.1 kg 79.4 kg 78.3 kg      Telemetry    NSR - Personally Reviewed  ECG    NSR, IVCD (atypical LBBB) - Personally Reviewed  Physical Exam  Obese GEN: No acute distress.   Neck: hard to see JVD Cardiac: RRR, no murmurs, rubs, or gallops.  Respiratory: Clear to auscultation bilaterally. GI: Soft, nontender, non-distended  MS: 3+ ankle and pretibial edema; No deformity. Neuro:  Nonfocal  Psych: Normal affect   Labs    High Sensitivity  Troponin:   Recent Labs  Lab 08/31/20 0652 08/31/20 0840  TROPONINIHS 72* 77*      Chemistry Recent Labs  Lab 08/31/20 2232 09/01/20 0159 09/02/20 0457  NA 138 136 138  K 4.1 3.5 3.6  CL 99 99 98  CO2 26 22 28   GLUCOSE 147* 133* 147*  BUN 72* 71* 68*  CREATININE 2.72* 2.49* 2.45*  CALCIUM 9.3 9.3 9.0  GFRNONAA 17* 19* 20*  ANIONGAP 13 15 12      Hematology Recent Labs  Lab 08/31/20 0212 09/01/20 0159 09/02/20 0457  WBC 6.1 5.9 4.8  RBC 3.97 3.75* 3.86*  HGB 12.7 11.8* 12.1  HCT 40.1 38.2 38.6  MCV 101.0* 101.9* 100.0  MCH 32.0 31.5 31.3  MCHC 31.7 30.9 31.3  RDW 14.6 14.5 14.5  PLT 196 169 179    BNP Recent Labs  Lab 08/31/20 0212  BNP >4,500.0*     DDimer No results for input(s): DDIMER in the last 168 hours.   Radiology    No results found.  Cardiac Studies   ECHO 08/31/2020  1. Limited exam as patient unable to cooperate.  2. Left ventricular ejection fraction, by estimation, is <20%. The left  ventricle has severely decreased function. The left ventricle demonstrates  global hypokinesis. The left ventricular internal cavity size was severely  dilated. There is mild left  ventricular hypertrophy. Left ventricular diastolic function could not be  evaluated.  3. Right ventricular systolic function is moderately reduced. The right  ventricular size is moderately enlarged.  4. There is a Mitra-Clip present in the mitral position. Mild to moderate  mitral valve stenosis, MG 6 mmHg at HR 98 bpm which is stable from prior  echo and likely partly elevated due to significant MR. Moderate to severe  mitral valve regurgitation.  Limited images, but MR appears moderate to severe.  5. The aortic valve was not well visualized. Aortic valve regurgitation  is not visualized. AV gradients were not assessed to evaluate for AS.   Patient Profile     75 y.o. female with combined systolic and diastolic HF, severe MR post Mitraclip (Oct 2020), dementia,  CKD 4 admitted for acute HF exacerbation  Assessment & Plan    She is subjectively improved, but hard to get objective assessment of the success of our diuretic strategy since output collection is incomplete. Weight has decreased 3 lb since yesterday, but is same as 2 days ago. Appears to be about 10 lb above "dry weight"  Treatment complicated by her cognitive deficits and intermittent lack of cooperation, also due to advanced CKD. Continue IV diuretics.     For questions or updates, please contact [place] Please consult [email or web address] for contact info under        Signed, Sanda Klein, MD  09/02/2020, 3:25 PM

## 2020-09-02 NOTE — Progress Notes (Signed)
Physical Therapy Treatment Patient Details Name: IRAM ASTORINO MRN: [id number] DOB: May 27, 1945 Today's Date: 09/02/2020    History of Present Illness 75 y.o. female referred to [facility] by PCP after labs showed an AKI, and in the setting of a suspected CHF exacerbation. Pt  presents with BLE edema, AKI, and right 4th toe redness. Pt was diagnosed with acute on chronic CHF exacerbation, cellulitis of R 4th toe 5/11. PMH includes chronic HFrEF, HTN, dementia, GI bleed s/p mesenteric artery coil embolization, anxiety, severe mitral regurgitation s/p MitraClip 02/02/19, symtpomatic anemia, insomnia.    PT Comments    Patient moaning in back pain upon PT arrival and wanting to use bathroom. Requires Min guard assist for standing from recliner but Min A to stand from a lower surface. Requires min A (HHA) for ambulation with pt reaching for counter/furniture for other UE support. Confused and in distress due to back pain. Able to be redirected and distracted for short periods but not able to sustain going back to the pain. RN made aware of need for pain meds. Will continue to follow and progress as able.   Follow Up Recommendations  Home health PT;Supervision/Assistance - 24 hour     Equipment Recommendations  None recommended by PT    Recommendations for Other Services       Precautions / Restrictions Precautions Precautions: Fall Restrictions Weight Bearing Restrictions: No    Mobility  Bed Mobility               General bed mobility comments: Sitting in chair on movable part upon PT arrival.    Transfers Overall transfer level: Needs assistance Equipment used: None Transfers: Sit to/from Stand Sit to Stand: Min guard;Min assist         General transfer comment: Min guard to steady in standing from recliner x2, Min A to stand from low toilet using grab bar.  Ambulation/Gait Ambulation/Gait assistance: Min assist Gait Distance (Feet): 25 Feet (x2 bouts) Assistive  device: 1 person hand held assist Gait Pattern/deviations: Step-through pattern;Decreased stride length;Trunk flexed Gait velocity: decreased   General Gait Details: Slow, unsteady gait with flexed posture at hips/back reaching for furniture/counter for support with other UE and HHA as well; Moaning about back pain.   Stairs             Wheelchair Mobility    Modified Rankin (Stroke Patients Only)       Balance Overall balance assessment: Needs assistance Sitting-balance support: Feet supported;No upper extremity supported Sitting balance-Leahy Scale: Good     Standing balance support: During functional activity Standing balance-Leahy Scale: Poor Standing balance comment: Pt relies upon at least single UE support during static standing and with gait.                            Cognition Arousal/Alertness: Awake/alert Behavior During Therapy: WFL for tasks assessed/performed Overall Cognitive Status: Impaired/Different from baseline Area of Impairment: Following commands;Safety/judgement;Memory;Attention                   Current Attention Level: Focused;Sustained Memory: Decreased short-term memory Following Commands: Follows one step commands consistently Safety/Judgement: Decreased awareness of deficits;Decreased awareness of safety   Problem Solving: Requires verbal cues General Comments: Hx of dementia. Easily distressed and distracted moaning of pain throughout. Can re-direct at times but not able to sustain. "Lord help me!"      Exercises      General Comments General comments (skin  integrity, edema, etc.): Sitter present. Pt confused and in distress due to back pain today.      Pertinent Vitals/Pain Pain Assessment: Faces Faces Pain Scale: Hurts whole lot Pain Location: back Pain Descriptors / Indicators: Discomfort;Grimacing;Moaning Pain Intervention(s): Limited activity within patient's tolerance;Repositioned;Patient requesting  pain meds-RN notified    Home Living                      Prior Function            PT Goals (current goals can now be found in the care plan section) Progress towards PT goals: Not progressing toward goals - comment (due to pain/distraction/cognition)    Frequency    Min 3X/week      PT Plan Current plan remains appropriate    Co-evaluation              AM-PAC PT "6 Clicks" Mobility   Outcome Measure  Help needed turning from your back to your side while in a flat bed without using bedrails?: A Little Help needed moving from lying on your back to sitting on the side of a flat bed without using bedrails?: A Little Help needed moving to and from a bed to a chair (including a wheelchair)?: A Little Help needed standing up from a chair using your arms (e.g., wheelchair or bedside chair)?: A Little Help needed to walk in hospital room?: A Little Help needed climbing 3-5 steps with a railing? : A Lot 6 Click Score: 17    End of Session   Activity Tolerance: Patient limited by pain Patient left: in chair;with call bell/phone within reach;with nursing/sitter in room Nurse Communication: Mobility status;Patient requests pain meds PT Visit Diagnosis: Muscle weakness (generalized) (M62.81);Other abnormalities of gait and mobility (R26.89);Pain Pain - part of body:  (back)     Time: 9983-3825 PT Time Calculation (min) (ACUTE ONLY): 11 min  Charges:  $Therapeutic Activity: 8-22 mins                     [name], PT, DPT Acute Rehabilitation Services Pager [phone] Office [place] 09/02/2020, 10:29 AM

## 2020-09-02 NOTE — Progress Notes (Signed)
Responded to consult for IV nurse tech present to assist.x2 unsuccessful attempts due to pt unable to remain still. Pt yelling "help me" notified RN.

## 2020-09-02 NOTE — Progress Notes (Addendum)
Palliative Medicine Inpatient Follow Up Note  Reason for consult:  Goals of Care "Goals of care discussion, worsening dementia"  HPI:  Per intake H&P --> Hannah B Robertsis a 75 y.o.femalepresenting with BLE edema, AKI, right 4th toe redness. PMH is significant forchronic HFrEF (last Echo 01/24/20- EF 20-25%), HTN, Dementia, GI bleed s/p mesenteric artery coil embolization, anxiety, severe mitral regurgitation s/p MitraClip (02/02/19), symptomatic anemia, insomnia.  Palliative care has been asked to get involved to aid in goals of care conversations in the setting of progressing dementia.   Today's Discussion (09/02/2020):  *Please note that this is a verbal dictation therefore any spelling or grammatical errors are due to the "[place]" system interpretation.  Chart reviewed.  I met with Hannah [name] at bedside this morning.  Spoke to her bedside nurse who shared that she was sleeping on and off for about 2-hour increments.  Upon assessment this morning she is exceptionally confused and continues to report to me that she "cannot breathe" and "does not know where she is".  I offered reorientation.  Spoke with nursing and a sitter was able to come to bedside.  Reviewed medications and changed BuSpar to Depakote to see if it had better utility in this instance.  I called patient's daughter, Hannah [name].  She expressed a variety of concerns to me about [facility] medical management.  She shares with me that she feels like [name]'s delirium is not being well treated nor are her behavioral symptoms associated with her dementia.  We reviewed in detail the idea of psychiatry coming by to provide additional recommendations if we are further unable to control symptoms.  Viewed in detail patient's history of heart failure and the effect it is having on her kidneys.  Discussed that this is something that of course we hope improves with treatment though if it does not we would be further discussing  the role of hospice and her mother's care.    We further reviewed that at this point in time Hannah [name] is awaiting a geriatrics visit in July and feels that this is something which will help her mom's state tremendously.  She shares with me that she is not ready to make her mother a DO NOT RESUSCITATE nor is she ready to consider hospice.    We reviewed that that is absolutely appropriate and we will continue providing all measures to help control [name]'s symptoms. I shared that it is my role to discuss worst case scenarios and these are topics that require discussion.   Requested that family medicine team reach out to [facility] given vocalize dissatisfaction to provide a comprehensive medical update.  Questions and concerns addressed   Objective Assessment: Vital Signs Vitals:   09/01/20 1910 09/02/20 0300  BP: (!) 91/47 105/66  Pulse: 99 96  Resp: 16 18  Temp: 97.7 F (36.5 C) 97.7 F (36.5 C)  SpO2: 96% 98%    Intake/Output Summary (Last 24 hours) at 09/02/2020 1047 Last data filed at 09/02/2020 0919 Gross per 24 hour  Intake 421.29 ml  Output 831 ml  Net -409.71 ml   Last Weight  Most recent update: 09/02/2020  3:05 AM   Weight  78.1 kg (172 lb 2.9 oz)           Gen: Robust elderly Caucasian female in no acute distress HEENT: moist mucous membranes CV: Regular rate and rhythm PULM: On 2LPM ABD: soft/nontender EXT: Bilateral lower extremity +2 pitting edema Neuro: Alert and oriented x2  SUMMARY OF RECOMMENDATIONS  Full Code - Patients daughter does not wish to change this until she meets with a Geriatrician in July  Insomnia: -  Ro[place] QHS  Delirium: - Strict delirium precautions, Depokote PO TID, continue Abilify If symptoms continue it may be of utility to consult psychiatry  Generalized Pain: - Tylenol TID  Appreciate pharmacy consult  TOC - OP Palliative care  Ongoing incremental palliative support in the oncoming days  Time Spent: 45 Greater than 50%  of the time was spent in counseling and coordination of care ______________________________________________________________________________________ [place] Team Team Cell Phone: [phone] Please utilize secure chat with additional questions, if there is no response within 30 minutes please call the above phone number  Palliative Medicine Team providers are available by phone from 7am to 7pm daily and can be reached through the team cell phone.  Should this patient require assistance outside of these hours, please call the patient's attending physician.

## 2020-09-02 NOTE — Progress Notes (Signed)
Pt. Unable to receive complete dose of IV Lasix infusion d/t pulling out and discontinuing her IV. IV RN attempt to reinsert x2. Unsuccessful. Pt. Agitated at the time of attempt. Pt. Yelling and screaming, confusion noted. Unable to redirect. On call for FMTS paged to make aware.

## 2020-09-02 NOTE — Progress Notes (Addendum)
Family Medicine Teaching Service Daily Progress Note Intern Pager: [phone]  [name] Medical record number: [id number] Date of birth: Jan 12, 1946 Age: 75 y.o. Gender: female  Primary Care Provider: [name], MD Consultants: Cardiology, palliative care   Code Status: Full Code  Pt Overview and Major Events to Date:  5/13 admitted  Assessment and Plan: Hannah [name] is a 75 y.o. female who presents with bilateral lower extremity edema secondary to HFrEF exacerbation. PMHx significant for HFrEF, severe MVR (s/p MitraClip 01/2019), dementia, GI bleed s/p mesenteric artery coil embolization, symptomatic anemia, insomnia.  HFrEF exacerbation Patient's IV furosemide increased to 120 mg BID daily by cards. Received 80 IV in Am and 120 in afternoon and then lost her IV, its back on this AM. Had an output of ~931. TTE on 5/14 revealing worsening EF less than 20% (though was a poor study).  Breathing comfortably on room air, but still volume overloaded on exam with 2+ pitting edema.  Still breathing comfortably on room air, but still volume overloaded on exam with 2+ pitting edema.  Cardiology is following and they recommended to hold metoprolol in setting of possible low output state, lactic acid normal 1.2.  Mag 1.8 on 5/14, repleted today. - cardiology consulted, appreciate recommendations - hold home metoprolol succinate 50 mg - strict I/O - daily weights -Mg sulphate 2g IV once - f/u mag in AM  Dementia with delirium I Anxiety Patient was agitated and anxious this morning during evaluation.  But, she is easily redirectable.  As per daughter noted to have agitation and anxiety with hallucinations.  Patient had sitter overnight and this morning.  Able to sleep  last night.  Evaluated patient 3 separate times this morning, 1st time patient was easily redirectable and was just complaining of back pain and next time patient was still redirectable but wanted to  urinate in commode after foley.  1:1 sitter currently in place.  - palliative care following, appreciate involvement with GOC discussion - continue 1:1 sitter, redirect if possible -C/w with Depakote 125 mg TID - continue aripriprazole 5 mg daily - hold buspirone 5 mg TID (previously taking once daily prn at home) - consider MRI pending palliative discussion given daughter's report of intermittent slurring of speech during some of the hallucination episodes - c/w ramelteon 8 mg qhs - avoid antipsychotics and/or benzodiazepines if possible given prolonged QTc (QTc 508 this morning) and age - delirium precautions  Cellulitis of right 4th toe Still erythematous and swollen.  Remains afebrile and without leukocytosis.  Antibiotics initiated on 5/14 - cephalexin 500 mg q8h (5/14-) - consider colchicine if no improvement with antibiotics (though would be careful given AKI)  ?  Tick bite Informed by RN this a.m. about a tick attached to patient's left arm, pictures under media tab.  Unsure about duration of tick attachment to arm.  Although as per CDC, [place] is under low incidence for Lyme's disease but discussed with daughter and she agrees with 1 dose prophylaxis of antibiotics. -1 dose of doxycycline 200 mg for prophylaxis  AKI on CKD stage IIIb Baseline creatinine around 1.3-1.5.  Suspect cardiorenal from volume overload.  No significant change, creatinine 2.49>2.45 this morning. - diuresis as above  Mitral valve regurgitation Patient is s/p MitraClip 02/03/2019.  TTE this admission revealing moderate to severe mitral valve regurgitation, MitraClip is in place.  Underlying valve disorder possibly contributing to CHF.  Insomnia Likely related to dementia. Trazodone discontinued due to concerns of hallucinations. -  ramelteon as above  GERD - continue PPI  OA Lumbar spine x-ray on 08/26/2020 shows multilevel disc osteophytic disease with no acute findings and no significant  changes from CT 2020. - scheduled APAP 500 mg TID   HTN Normotensive to soft. - continue home metoprolol succinate as above  HLD - continue home statin  History of GI bleed Mild drop in Hgb from 12.7 to 11.8 this AM. On ferrous sulfate 324 mg daily at home but reportedly not taking. - monitor on CBC  Incidental pulmonary nodule Seen on CTA 09/2019.  - repeat outpatient June 2022  Candidal Intertrigo Noted on admission exam under breasts and in groin - nystatin ointment BID  FEN/GI: heart healthy PPx: LMWH  Disposition: med-tele  Subjective:  Received 1 dose of Risperdal 0.5 mg yesterday ~6pm for agitation. Patient evaluated at bedside this morning multiple times.  She was easily redirectable but was complaining of back pain, urinary problem or sometime just anxiety.  Of note 1:1 sitter present in the room.  Received multiple pages from RN and patient was evaluated each time.  Patient responds well to 1 person at one time.  As per RN bladder scan was attempted and they were unable to obtain it, then they were not able to do I&O cath caths and are not used Foley cath and was able to put out 250 mL.  RN was instructed to remove the Foley.  Phone conversation with daughter Wonda Cerise @[phone] : Daughter is updated in detail about patient's ongoing management problem wise.  Daughter is extremely appreciative of all the help that patient is receiving at hospital and thankful for the update.  All the questions were answered appropriately.  Objective: Temp:  [97.7 F (36.5 C)] 97.7 F (36.5 C) (05/16 0300) Pulse Rate:  [96-99] 96 (05/16 0300) Resp:  [16-18] 18 (05/16 0300) BP: (91-105)/(47-66) 105/66 (05/16 0300) SpO2:  [96 %-98 %] 98 % (05/16 0300) Weight:  [172 lb 2.9 oz (78.1 kg)] 172 lb 2.9 oz (78.1 kg) (05/16 0300) Physical Exam: General: Elderly female sitting in bed and then moved to recliner, in mild distress but responds to redirection. Cardiovascular: Regular  rate and rhythm, no murmurs Abdomen: Soft, nontender, nondistended, bowel sounds present Extremities: Warm, 2+ pitting edema up to knee bilaterally, right fourth toe erythematous and edematous, no significant change. Neuro: Alert, awake, oriented to self and place.       Laboratory: Recent Labs  Lab 08/31/20 0212 09/01/20 0159 09/02/20 0457  WBC 6.1 5.9 4.8  HGB 12.7 11.8* 12.1  HCT 40.1 38.2 38.6  PLT 196 169 179   Recent Labs  Lab 08/31/20 2232 09/01/20 0159 09/02/20 0457  NA 138 136 138  K 4.1 3.5 3.6  CL 99 99 98  CO2 26 22 28   BUN 72* 71* 68*  CREATININE 2.72* 2.49* 2.45*  CALCIUM 9.3 9.3 9.0  GLUCOSE 147* 133* 147*   EKG: QTc 508  Imaging/Diagnostic Tests: No results found.   Honor Junes, MD 09/02/2020, 1:57 PM PGY-1, [facility] Intern pager: [phone], text pages welcome

## 2020-09-02 NOTE — Progress Notes (Addendum)
Heart Failure Navigator Progress Note  Assessed for Heart & Vascular TOC clinic readiness.  Unfortunately at this time the patient does not meet criteria due to severe MR;  palliative care involved, recommend Palliative care OP.  Pt has a history of dementia with hallucinations note per FMTS note via daughter update. Pt currently has sitter at bedside at time of screening. Pt dry-heaving with emesis bag present. During the 3 minutes while in the room the patient was very restless, unable to tell me date, location, time. Pt heard hollering out in hallway after completing interview.   Navigator available for reassessment of patient as plan of care evolves.  Pricilla Holm, RN, BSN Heart Failure Nurse Navigator [phone]

## 2020-09-02 NOTE — Progress Notes (Signed)
Heart Failure Stewardship Pharmacist Progress Note   PCP: [name], MD PCP-Cardiologist: Jenkins Rouge, MD    HPI:  75 yo F with PMH of CHF, severe MR s/p MitraClip in 2020, dementia, GI bleeding, and CKD. She was admitted on 08/30/20 for LE edema and CHF exacerbation. A CXR was done on 08/31/20 and found to have mild CHF. An ECHO was also done on 08/31/20 and LVEF was <20% (20-25% in 01/2020, 30-35% in 02/2019, and 60-65% in 10/2018).   Current HF Medications: Furosemide 120 mg IV BID  Prior to admission HF Medications: Furosemide 80 mg daily Metoprolol XL 50 mg daily  Pertinent Lab Values: . Serum creatinine 2.45, BUN 68, Potassium 3.6, Sodium 138, BNP >4500  Vital Signs: . Weight: 172 lbs (admission weight: 172 lbs) . Blood pressure: 100/60s  . Heart rate: 90s   Medication Assistance / Insurance Benefits Check: Does the patient have prescription insurance?  Yes Type of insurance plan: [employer] Medicare  Does the patient qualify for medication assistance through manufacturers or grants?   Pending . Eligible grants and/or patient assistance programs: pending . Medication assistance applications in progress: none  . Medication assistance applications approved: none Approved medication assistance renewals will be completed by: pending  Outpatient Pharmacy:  Prior to admission outpatient pharmacy: [facility] Is the patient willing to use [facility] at discharge? Yes Is the patient willing to transition their outpatient pharmacy to utilize a [facility] outpatient pharmacy?   Pending    Assessment: 1. Acute on chronic systolic CHF (EF <02%), due to presumed NICM. NYHA class III symptoms. Also with AKI - SCr 2.5 range (was ~1.5 prior to this admission). - Continue furosemide 120 mg IV BID - Consider resuming PTA metoprolol XL since this was a chronic HFrEF medication before this exacerbation. - AKI limits ACE/ARB/ARNI, spironolactone, or SGLT2i at this time    Plan: 1) Medication changes recommended at this time: - Continue IV diuresis   2) Patient assistance: Delene Loll copay $45 per month - Jardiance copay $45 per month - Farxiga copay $95 per month  3)  Education  - To be completed prior to discharge  Kerby Nora, PharmD, BCPS Heart Failure Cytogeneticist Phone [phone]

## 2020-09-02 NOTE — Progress Notes (Signed)
Pt has been screaming since shift change, complaining of pain of no origin, claims it's everywhere. Keeps going back and forth from bed to commode saying "I gotta pee, I can't pee." RN and sitter attempted to bladders scan multiple times, but unable to get an accurate reading due to pt not being able to lay still and fighting staff. RN checked orders and saw a PRN straight cath order. No I&O cath kits available so this RN used a foley kit. Pt put out 250 immediately but then started tugging at it. In the meantime, the care team was called and per MD, foley was removed. Pt received all PO meds to no effect. Later on, the sitter called the RN in saying she found a tick on pt inner RUE. RN pulled it off and paged MD.

## 2020-09-02 NOTE — Progress Notes (Signed)
Progress Note  S: Received page from RN that patient pulled IV out and was unable to receive Lasix dose. Additional page followed stating that patient was yelling and screaming. In to see patient who was laying in bed. She was stating "help me" and "I can't breathe". Patient requested to get up and walk to the door and back and was able to do this with assistance and with use of walker. She was redirectable and when advised that she was in the hospital and that medications were important to help get the excess fluid off of her, she was amenable to taking all her medications.   O:  Blood pressure 105/61, pulse (!) 107, temperature (!) 97.5 F (36.4 C), temperature source Oral, resp. rate 20, height 5\' 2"  (1.575 m), weight 78.1 kg, SpO2 95 %. Gen: Awake, alert and oriented to person only.  Resp: CTAB without wheezing or crackles, breathing comfortably on room air  Psych: demented, redirectable  A/P: 75 y/o F with HFrEF admitted for CHF exacerbation. Appreciate cardiology and pallitative's assistance and care. Patient has not diuresed well thus far with only a total net -500cc since admission. Given that she pulled out IV access, will give one dose of 100 mg PO Torsemide. Can retry PIV insertion when patient is more calm, likely in the AM.  - 100 mg PO Torsemide x1 - Strict I/O's, daily weights  - Additional plan per prior note

## 2020-09-03 DIAGNOSIS — I5043 Acute on chronic combined systolic (congestive) and diastolic (congestive) heart failure: Secondary | ICD-10-CM | POA: Diagnosis not present

## 2020-09-03 DIAGNOSIS — R600 Localized edema: Secondary | ICD-10-CM | POA: Diagnosis not present

## 2020-09-03 DIAGNOSIS — I5023 Acute on chronic systolic (congestive) heart failure: Secondary | ICD-10-CM | POA: Diagnosis not present

## 2020-09-03 DIAGNOSIS — I340 Nonrheumatic mitral (valve) insufficiency: Secondary | ICD-10-CM | POA: Diagnosis not present

## 2020-09-03 LAB — BASIC METABOLIC PANEL
Anion gap: 15 (ref 5–15)
BUN: 66 mg/dL — ABNORMAL HIGH (ref 8–23)
CO2: 23 mmol/L (ref 22–32)
Calcium: 9.2 mg/dL (ref 8.9–10.3)
Chloride: 100 mmol/L (ref 98–111)
Creatinine, Ser: 2.43 mg/dL — ABNORMAL HIGH (ref 0.44–1.00)
GFR, Estimated: 20 mL/min — ABNORMAL LOW (ref >60)
Glucose, Bld: 144 mg/dL — ABNORMAL HIGH (ref 70–99)
Potassium: 3.5 mmol/L (ref 3.5–5.1)
Sodium: 138 mmol/L (ref 135–145)

## 2020-09-03 LAB — TROPONIN I (HIGH SENSITIVITY)
Troponin I (High Sensitivity): 122 ng/L (ref <18)
Troponin I (High Sensitivity): 127 ng/L (ref <18)

## 2020-09-03 LAB — MAGNESIUM: Magnesium: 1.9 mg/dL (ref 1.7–2.4)

## 2020-09-03 MED ORDER — METOLAZONE 5 MG PO TABS
5.0000 mg | ORAL_TABLET | Freq: Once | ORAL | Status: AC
  Administered 2020-09-03: 5 mg via ORAL
  Filled 2020-09-03: qty 1

## 2020-09-03 NOTE — Progress Notes (Signed)
Pt. Troponin 122. On call for FMTS paged to make aware.

## 2020-09-03 NOTE — Plan of Care (Signed)

## 2020-09-03 NOTE — Progress Notes (Addendum)
Progress Note  [name] Date of Encounter: 09/03/2020  [facility] Cardiologist: Jenkins Rouge, MD   Subjective   Reports improving breathing. No chest pain.   Inpatient Medications    Scheduled Meds: . ARIPiprazole  5 mg Oral Daily  . atorvastatin  80 mg Oral Daily  . cephALEXin  500 mg Oral Q8H  . divalproex  125 mg Oral TID  . enoxaparin (LOVENOX) injection  30 mg Subcutaneous Q24H  . lidocaine  2 patch Transdermal Q24H  . nystatin ointment   Topical BID  . pantoprazole  40 mg Oral Daily  . polyethylene glycol  17 g Oral Daily  . ramelteon  8 mg Oral QHS  . sodium chloride flush  3 mL Intravenous Q12H   Continuous Infusions: . sodium chloride    . furosemide 120 mg (09/02/20 1847)   PRN Meds: sodium chloride, acetaminophen, sodium chloride flush   Vital Signs    Vitals:   09/02/20 0900 09/02/20 1917 09/03/20 0324 09/03/20 1135  BP:  105/61 103/66 115/72  Pulse:  (!) 107 90 (!) 116  Resp:  20 18 17   Temp:  (!) 97.5 F (36.4 C) (!) 97.1 F (36.2 C)   TempSrc:  Oral Oral   SpO2:  95%  97%  Weight:   77.1 kg   Height: 5\' 2"  (1.575 m)       Intake/Output Summary (Last 24 hours) at 09/03/2020 1207 Last data filed at 09/03/2020 0841 Gross per 24 hour  Intake 960 ml  Output 800 ml  Net 160 ml   Last 3 Weights 09/03/2020 09/02/2020 09/01/2020  Weight (lbs) 170 lb 172 lb 2.9 oz 175 lb 0.7 oz  Weight (kg) 77.111 kg 78.1 kg 79.4 kg      Telemetry    N/A  ECG    N/A  Physical Exam   GEN: No acute distress.   Neck: No JVD Cardiac: RRR, no murmurs, rubs, or gallops.  Respiratory: Clear to auscultation bilaterally. GI: Soft, nontender, non-distended  MS:2+ edema; No deformity. Neuro:  Nonfocal  Psych: Normal affect   Labs    High Sensitivity Troponin:   Recent Labs  Lab 08/31/20 0652 08/31/20 0840 09/03/20 0200 09/03/20 0332  TROPONINIHS 72* 77* 122* 127*      Chemistry Recent Labs  Lab 09/01/20 0159  09/02/20 0457 09/03/20 0200  NA 136 138 138  K 3.5 3.6 3.5  CL 99 98 100  CO2 22 28 23   GLUCOSE 133* 147* 144*  BUN 71* 68* 66*  CREATININE 2.49* 2.45* 2.43*  CALCIUM 9.3 9.0 9.2  GFRNONAA 19* 20* 20*  ANIONGAP 15 12 15      Hematology Recent Labs  Lab 08/31/20 0212 09/01/20 0159 09/02/20 0457  WBC 6.1 5.9 4.8  RBC 3.97 3.75* 3.86*  HGB 12.7 11.8* 12.1  HCT 40.1 38.2 38.6  MCV 101.0* 101.9* 100.0  MCH 32.0 31.5 31.3  MCHC 31.7 30.9 31.3  RDW 14.6 14.5 14.5  PLT 196 169 179    BNP Recent Labs  Lab 08/31/20 0212  BNP >4,500.0*     DDimer No results for input(s): DDIMER in the last 168 hours.   Radiology    No results found.  Cardiac Studies   Echo 08/31/2020 1. Limited exam as patient unable to cooperate.  2. Left ventricular ejection fraction, by estimation, is <20%. The left  ventricle has severely decreased function. The left ventricle demonstrates  global hypokinesis. The left ventricular internal cavity size was  severely  dilated. There is mild left  ventricular hypertrophy. Left ventricular diastolic function could not be  evaluated.  3. Right ventricular systolic function is moderately reduced. The right  ventricular size is moderately enlarged.  4. There is a Mitra-Clip present in the mitral position. Mild to moderate  mitral valve stenosis, MG 6 mmHg at HR 98 bpm which is stable from prior  echo and likely partly elevated due to significant MR. Moderate to severe  mitral valve regurgitation.  Limited images, but MR appears moderate to severe.  5. The aortic valve was not well visualized. Aortic valve regurgitation  is not visualized. AV gradients were not assessed to evaluate for AS  Patient Profile     75 y.o. female with a hx of chronic combined systolic and diastolic heart failure (EF 20 to 25%), severe mitral regurgitation status post MitraClip 02/02/2019, dementia, GI bleeding, CKD seen for CHF.   Assessment & Plan    1. Acute on  chronic combined systolic and diastolic heart failure: - Presented with volume overload, BNP greater than 4500.   - Echocardiogram 5/14 with limited images as patient was unable to cooperate with exam, but showed EF less than 20%, moderately reduced RV function, moderate to severe MR, mild to moderate MS. -Breathing is improving with diuresis - I & O not recorded due to dementia  - Weight 174>>170lb since admit - Breathing improving.  - Scr stable - Continue IV diuresis - Restart home Toprol XL once better volume status (likely tomorrow) - No ACE/ARB/ARNI, spironolactone, or SGLT2i due to CKD  2. AKI:  - Scr stable at 2.4  (was 1.2- 1.3 in January)  3. Troponin elevation:  - Likely demand ischemia    For questions or updates, please contact [place] Please consult [email or web address] for contact info under        Signed, Leanor Kail, PA  09/03/2020, 12:07 PM    I have seen and examined the patient along with Leanor Kail, [place] .  I have reviewed the chart, notes and new data.  I agree with PA's note.  Key new complaints: reports improvement Key examination changes: still markedly edematous, but breathing seems to be comfortable, obesity limits evaluation of JVP, lungs are clear. Not cooperative w I/O, but weight is decreasing. Key new findings / data: BUN and creat with slight improving trend, although still markedly worse than in January. BNP>4500. Baseline in 2020 was 250-300.  PLAN: Still clearly hypervolemic. Continue diuresis.   Sanda Klein, MD, [place] [phone] 09/03/2020, 12:55 PM

## 2020-09-03 NOTE — Progress Notes (Addendum)
Family Medicine Teaching Service Daily Progress Note Intern Pager: [phone]  [name] Medical record number: [id number] Date of birth: 08/05/1945 Age: 75 y.o. Gender: female  Primary Care Provider: [name], MD Consultants: Cards Code Status: Full  Pt Overview and Major Events to Date:  5/13 Admitted  Assessment and Plan: Hannah [name] is a 75 y.o. female who presents with bilateral lower extremity edema secondary to HFrEF exacerbation. PMHx significant for HFrEF, severe MVR (s/p MitraClip 01/2019), dementia, GI bleed s/p mesenteric artery coil embolization, symptomatic anemia, insomnia.   HFrEF exacerbation Patient received IV Furosemide 120 mg BID yesterday and dose of Oral Torsemide 100 mg.  Had 900 mL urine output charted.  Spoke with Nephrology who recommended giving dose of Mitolazone 5 mg.  Hypomagnesemia resolved.   - Cards following, appreciate recs - Continue IV Lasix 120 mg BID - Gave 1 dose of Metolazone 5 mg - Hold metoprolol succinate 50 mg - strict I/O - daily weights - f/u mag in AM - Daily updates for daughter   Dementia with delirium I Anxiety Patient was calm and cooperative this morning during evaluation.  Has been redirectable most of the time.  Yesterday pulled out IV twice.  Palliative spoke with Daughter yesterday and daughter indicated she does not wish to change GoC at this time.  As per daughter noted to have agitation and anxiety with hallucinations.  Patient had sitter overnight and this morning.  Full Diet.  1:1 sitter currently in place.  - palliative care following, appreciate involvement with [place] discussion - continue 1:1 sitter, redirect if possible -C/w with Depakote 125 mg TID - continue aripriprazole 5 mg daily - hold home Buspar - c/w ramelteon 8 mg qhs - avoid antipsychotics and/or benzodiazepines if possible given prolonged QTc (QTc 508 this morning) and age - delirium precautions  Cellulitis of  right 4th toe Some improvement from initial appearance.  Remains afebrile and without leukocytosis.  Antibiotics initiated on 5/14. - cephalexin 500 mg q8h (5/14-) - consider colchicine if no improvement with antibiotics (though would be careful given CKD)  Tick bite Received 1 dose oral doxycycline yesterday.  CKD Stage IV Likely progression of CKD given stable Creatinine of 2.5. - Daily BMP - See diuresis per above  Mitral valve regurgitation Patient is s/p MitraClip 02/03/2019.  TTE this admission revealing moderate to severe mitral valve regurgitation, MitraClip is in place.  Underlying valve disorder possibly contributing to CHF.  Insomnia Likely related to dementia. Trazodone discontinued due to concerns of hallucinations. - ramelteon as above  GERD - continue PPI  HTN Normotensive to soft. - Holding home Metoprolol  HLD - continue home statin  Incidental pulmonary nodule Seen on CTA 09/2019.  - repeat outpatient June 2022  Candidal Intertrigo Noted on admission exam under breasts and in groin - nystatin ointment BID   FEN/GI: Heart Healthy diet PPx: Lovenox   Status is: Inpatient  Remains inpatient appropriate because:Inpatient level of care appropriate due to severity of illness   Dispo: The patient is from: Home              Anticipated d/c is to: Home              Patient currently is not medically stable to d/c.   Difficult to place patient No     Subjective:  Patient indicates she is feeling well.  Indicates she still has swelling in her legs.  Objective: Temp:  [97.1 F (36.2 C)-97.5 F (  36.4 C)] 97.1 F (36.2 C) (05/17 0324) Pulse Rate:  [90-107] 90 (05/17 0324) Resp:  [18-20] 18 (05/17 0324) BP: (103-105)/(61-66) 103/66 (05/17 0324) SpO2:  [95 %] 95 % (05/16 1917) Weight:  [77.1 kg] 77.1 kg (05/17 0324) Physical Exam:  Physical Exam Constitutional:      General: She is not in acute distress.    Appearance: Normal  appearance.  HENT:     Head: Normocephalic and atraumatic.  Cardiovascular:     Rate and Rhythm: Normal rate and regular rhythm.  Pulmonary:     Effort: Pulmonary effort is normal.     Breath sounds: Rales present.  Abdominal:     General: Abdomen is flat.     Palpations: Abdomen is soft.  Musculoskeletal:     Right lower leg: Edema present.     Left lower leg: Edema present.     Comments: 2+ Pitting edema bilaterally  Neurological:     General: No focal deficit present.     Mental Status: She is alert.     Laboratory: Recent Labs  Lab 08/31/20 0212 09/01/20 0159 09/02/20 0457  WBC 6.1 5.9 4.8  HGB 12.7 11.8* 12.1  HCT 40.1 38.2 38.6  PLT 196 169 179   Recent Labs  Lab 09/01/20 0159 09/02/20 0457 09/03/20 0200  NA 136 138 138  K 3.5 3.6 3.5  CL 99 98 100  CO2 22 28 23   BUN 71* 68* 66*  CREATININE 2.49* 2.45* 2.43*  CALCIUM 9.3 9.0 9.2  GLUCOSE 133* 147* 144*     Imaging/Diagnostic Tests:  No new imaging  Delora Fuel, MD 09/03/2020, 8:08 AM PGY-1, [place] Intern pager: [phone], text pages welcome

## 2020-09-03 NOTE — Plan of Care (Signed)
  Problem: Coping: Goal: Level of anxiety will decrease Outcome: Not Progressing Pt. Yelling and screming out. MD aware.   Problem: Pain Managment: Goal: General experience of comfort will improve Outcome: Not Progressing PRN  Pain medication administered

## 2020-09-03 NOTE — Progress Notes (Signed)
Occupational Therapy Treatment Patient Details Name: TALINE NASS MRN: [id number] DOB: 1945-07-20 Today's Date: 09/03/2020    History of present illness 75 y.o. female referred to [facility] by PCP after labs showed an AKI, and in the setting of a suspected CHF exacerbation. Pt  presents with BLE edema, AKI, and right 4th toe redness. Pt was diagnosed with acute on chronic CHF exacerbation, cellulitis of R 4th toe 5/11. PMH includes chronic HFrEF, HTN, dementia, GI bleed s/p mesenteric artery coil embolization, anxiety, severe mitral regurgitation s/p MitraClip 02/02/19, symtpomatic anemia, insomnia.   OT comments  Pt making progress with functional goals. Pt in process of sitting EOB upon arrival with RN, NT and sitter present. Session focused on AD mobility 1 person HHA to bathroom for toilet transfers, toileting tasks, standing at sink for hygiene. Pt left up in recliner to eat her lunch at end of session with RN and sitter present. OT will continue to follow acutely to maximize level of function and safety  Follow Up Recommendations  Home health OT;Supervision/Assistance - 24 hour    Equipment Recommendations  None recommended by OT    Recommendations for Other Services      Precautions / Restrictions Precautions Precautions: Fall Restrictions Weight Bearing Restrictions: No       Mobility Bed Mobility Overal bed mobility: Needs Assistance Bed Mobility: Supine to Sit     Supine to sit: Supervision     General bed mobility comments: in transition to sitting EOB upo arrival. RN, NT and sitter present    Transfers Overall transfer level: Needs assistance Equipment used: None;1 person hand held assist Transfers: Sit to/from Stand Sit to Stand: Min guard;Min assist Stand pivot transfers: Min guard            Balance Overall balance assessment: Needs assistance Sitting-balance support: Feet supported;No upper extremity supported Sitting balance-Leahy Scale: Good      Standing balance support: During functional activity;No upper extremity supported Standing balance-Leahy Scale: Poor                             ADL either performed or assessed with clinical judgement   ADL Overall ADL's : Needs assistance/impaired     Grooming: Wash/dry hands;Wash/dry face;Min Dispensing optician: Min guard;Ambulation;Comfort height toilet;Grab bars   Toileting- Clothing Manipulation and Hygiene: Minimal assistance;Sit to/from stand       Functional mobility during ADLs: Min guard;Cueing for safety       Vision Patient Visual Report: No change from baseline     Perception     Praxis      Cognition Arousal/Alertness: Awake/alert Behavior During Therapy: WFL for tasks assessed/performed Overall Cognitive Status: Impaired/Different from baseline Area of Impairment: Following commands;Safety/judgement;Memory;Attention                 Orientation Level: Situation;Disoriented to;Time;Place   Memory: Decreased short-term memory Following Commands: Follows one step commands consistently Safety/Judgement: Decreased awareness of deficits;Decreased awareness of safety   Problem Solving: Requires verbal cues General Comments: Hx of dementia.        Exercises     Shoulder Instructions       General Comments      Pertinent Vitals/ Pain       Pain Assessment: Faces Faces Pain Scale: Hurts little more Pain Location: back Pain Descriptors / Indicators: Discomfort;Grimacing Pain Intervention(s):  Monitored during session;Repositioned;RN gave pain meds during session  Home Living                                          Prior Functioning/Environment              Frequency  Min 2X/week        Progress Toward Goals  OT Goals(current goals can now be found in the care plan section)  Progress towards OT goals: Progressing toward goals     Plan Discharge plan  remains appropriate    Co-evaluation                 AM-PAC OT "6 Clicks" Daily Activity     Outcome Measure   Help from another person eating meals?: None Help from another person taking care of personal grooming?: A Little Help from another person toileting, which includes using toliet, bedpan, or urinal?: A Little Help from another person bathing (including washing, rinsing, drying)?: A Little Help from another person to put on and taking off regular upper body clothing?: None Help from another person to put on and taking off regular lower body clothing?: A Little 6 Click Score: 20    End of Session    OT Visit Diagnosis: Other abnormalities of gait and mobility (R26.89);Muscle weakness (generalized) (M62.81);Other symptoms and signs involving cognitive function   Activity Tolerance Patient tolerated treatment well   Patient Left with call bell/phone within reach;in chair;with chair alarm set;with nursing/sitter in room   Nurse Communication          Time: [phone] OT Time Calculation (min): 18 min  Charges: OT General Charges $OT Visit: 1 Visit OT Treatments $Self Care/Home Management : 8-22 mins     Britt Bottom 09/03/2020, 2:48 PM

## 2020-09-03 NOTE — Progress Notes (Signed)
Heart Failure Stewardship Pharmacist Progress Note   PCP: [name], MD PCP-Cardiologist: Jenkins Rouge, MD    HPI:  75 yo F with PMH of CHF, severe MR s/p MitraClip in 2020, dementia, GI bleeding, and CKD. She was admitted on 08/30/20 for LE edema and CHF exacerbation. A CXR was done on 08/31/20 and found to have mild CHF. An ECHO was also done on 08/31/20 and LVEF was <20% (20-25% in 01/2020, 30-35% in 02/2019, and 60-65% in 10/2018).   Current HF Medications: Furosemide 120 mg IV BID  Prior to admission HF Medications: Furosemide 80 mg daily Metoprolol XL 50 mg daily  Pertinent Lab Values: . Serum creatinine 2.43, BUN 66, Potassium 3.5, Sodium 138, BNP >4500  Vital Signs: . Weight: 170 lbs (admission weight: 172 lbs) . Blood pressure: 100/60s  . Heart rate: 90s  . I/O: neg 1L yesterday, net neg 300 mL since admission   Medication Assistance / Insurance Benefits Check: Does the patient have prescription insurance?  Yes Type of insurance plan: [employer] Medicare  Does the patient qualify for medication assistance through manufacturers or grants?   Pending . Eligible grants and/or patient assistance programs: pending . Medication assistance applications in progress: none  . Medication assistance applications approved: none Approved medication assistance renewals will be completed by: pending  Outpatient Pharmacy:  Prior to admission outpatient pharmacy: [facility] Is the patient willing to use [facility] at discharge? Yes Is the patient willing to transition their outpatient pharmacy to utilize a [facility] outpatient pharmacy?   Pending    Assessment: 1. Acute on chronic systolic CHF (EF <11%), due to presumed NICM. NYHA class III symptoms. Also with AKI - SCr 2.5 range (was ~1.5 prior to this admission). - Continue furosemide 120 mg IV BID. Need potassium supplementation. - Consider resuming PTA metoprolol XL since this was a chronic HFrEF medication before  this exacerbation. - AKI limits ACE/ARB/ARNI, spironolactone, or SGLT2i at this time   Plan: 1) Medication changes recommended at this time: - Continue IV diuresis  - Restart metoprolol XL 50 mg daily - Add potassium supplementation  2) Patient assistance: - Entresto copay $45 per month - Jardiance copay $45 per month - Farxiga copay $95 per month  3)  Education  - To be completed prior to discharge  Kerby Nora, PharmD, BCPS Heart Failure Cytogeneticist Phone [phone]

## 2020-09-04 DIAGNOSIS — R600 Localized edema: Secondary | ICD-10-CM | POA: Diagnosis not present

## 2020-09-04 DIAGNOSIS — I5043 Acute on chronic combined systolic (congestive) and diastolic (congestive) heart failure: Secondary | ICD-10-CM | POA: Diagnosis not present

## 2020-09-04 DIAGNOSIS — I5023 Acute on chronic systolic (congestive) heart failure: Secondary | ICD-10-CM | POA: Diagnosis not present

## 2020-09-04 LAB — BASIC METABOLIC PANEL
Anion gap: 14 (ref 5–15)
Anion gap: 14 (ref 5–15)
BUN: 66 mg/dL — ABNORMAL HIGH (ref 8–23)
BUN: 66 mg/dL — ABNORMAL HIGH (ref 8–23)
CO2: 27 mmol/L (ref 22–32)
CO2: 24 mmol/L (ref 22–32)
Calcium: 9.7 mg/dL (ref 8.9–10.3)
Calcium: 9.9 mg/dL (ref 8.9–10.3)
Chloride: 99 mmol/L (ref 98–111)
Chloride: 102 mmol/L (ref 98–111)
Creatinine, Ser: 2.43 mg/dL — ABNORMAL HIGH (ref 0.44–1.00)
Creatinine, Ser: 2.57 mg/dL — ABNORMAL HIGH (ref 0.44–1.00)
GFR, Estimated: 20 mL/min — ABNORMAL LOW (ref >60)
GFR, Estimated: 19 mL/min — ABNORMAL LOW (ref >60)
Glucose, Bld: 132 mg/dL — ABNORMAL HIGH (ref 70–99)
Glucose, Bld: 155 mg/dL — ABNORMAL HIGH (ref 70–99)
Potassium: 2.8 mmol/L — ABNORMAL LOW (ref 3.5–5.1)
Potassium: 4.1 mmol/L (ref 3.5–5.1)
Sodium: 140 mmol/L (ref 135–145)
Sodium: 140 mmol/L (ref 135–145)

## 2020-09-04 LAB — URINALYSIS, ROUTINE W REFLEX MICROSCOPIC
Bilirubin Urine: NEGATIVE
Glucose, UA: NEGATIVE mg/dL
Hgb urine dipstick: NEGATIVE
Ketones, ur: NEGATIVE mg/dL
Leukocytes,Ua: NEGATIVE
Nitrite: NEGATIVE
Protein, ur: NEGATIVE mg/dL
Specific Gravity, Urine: 1.008 (ref 1.005–1.030)
pH: 5 (ref 5.0–8.0)

## 2020-09-04 LAB — MAGNESIUM: Magnesium: 1.9 mg/dL (ref 1.7–2.4)

## 2020-09-04 MED ORDER — DICLOFENAC SODIUM 1 % EX GEL
2.0000 g | Freq: Four times a day (QID) | CUTANEOUS | Status: DC
  Administered 2020-09-04 – 2020-09-11 (×26): 2 g via TOPICAL
  Filled 2020-09-04: qty 100

## 2020-09-04 MED ORDER — ACETAMINOPHEN 325 MG PO TABS
650.0000 mg | ORAL_TABLET | Freq: Four times a day (QID) | ORAL | Status: DC
  Administered 2020-09-04 – 2020-09-11 (×20): 650 mg via ORAL
  Filled 2020-09-04 (×21): qty 2

## 2020-09-04 MED ORDER — OXYCODONE HCL 5 MG PO TABS
2.5000 mg | ORAL_TABLET | Freq: Once | ORAL | Status: AC
  Administered 2020-09-04: 2.5 mg via ORAL
  Filled 2020-09-04: qty 1

## 2020-09-04 MED ORDER — ACETAMINOPHEN 325 MG PO TABS: 325.0000 mg | ORAL_TABLET | Freq: Four times a day (QID) | ORAL | Status: DC

## 2020-09-04 MED ORDER — POTASSIUM CHLORIDE CRYS ER 20 MEQ PO TBCR
40.0000 meq | EXTENDED_RELEASE_TABLET | ORAL | Status: AC
  Administered 2020-09-04 (×3): 40 meq via ORAL
  Filled 2020-09-04 (×3): qty 2

## 2020-09-04 MED ORDER — POTASSIUM CHLORIDE CRYS ER 20 MEQ PO TBCR: 40.0000 meq | EXTENDED_RELEASE_TABLET | ORAL | Status: DC

## 2020-09-04 MED ORDER — POTASSIUM CHLORIDE CRYS ER 20 MEQ PO TBCR: 40.0000 meq | EXTENDED_RELEASE_TABLET | Freq: Two times a day (BID) | ORAL | Status: DC

## 2020-09-04 NOTE — Progress Notes (Addendum)
FPTS Interim Progress Note  S:Recieved phone call from RN that patients daughter wanted to speak with MD.  Reports upset that mother having pain and Tylenol was decreased.  She feel that mom still has significant swelling and is concerned that Lasix is stopped and potassium is low. She is unhappy that she was not told about this.    Went to assess patient and speak with daughter.  Discussed in detail that only limited medications can be given for pain due to elevated Cr. Also explained that potassium low due to Lasix and this was stopped until potassium was able to be replaced.  When can it will be restarted.    She asked about bruising on patients right side.  Explained that patient was receiving Lovenox and this is injection reaction.  Spoke with RN and no reports of trauma or recent falls.  See media for pics.   Will prescribe OxyContin IR 2.5 mg po x 1.  If needed can have another dose in 6 hours.   Continue Tylenol as scheduled   Daughter happy and appreciative with update. She and her brother would like for her mother to be pain free.    An update will be provided tomorrow.   Carollee Leitz, MD 09/04/2020, 7:28 PM PGY-2, [place] Medicine Service pager [phone]

## 2020-09-04 NOTE — Progress Notes (Signed)
Family Medicine Teaching Service Daily Progress Note Intern Pager: [phone]  [name] Medical record number: [id number] Date of birth: 1945/08/09 Age: 75 y.o. Gender: female  Primary Care Provider: [name], MD Consultants: Cards Code Status: Full  Pt Overview and Major Events to Date:  5/14 Admitted  Assessment and Plan: Hannah Riggins Robertsis a 75 y.o.femalewho presents with bilateral lower extremity edema secondary to HFrEF exacerbation. PMHx significant for HFrEF, severe MVR (s/p MitraClip 01/2019), dementia, GI bleed s/p mesenteric artery coil embolization, symptomatic anemia, insomnia.  HFrEF exacerbation Patient received IV Furosemide 120 mg BID and one dose Metolazone yesterday.   Had 500 mL urine output charted yesterday.  Sitter reports new urination this morning.  Discussed making sure even smallest urinations by patients all get charted  Down 0.7 kg from yesterday.  Had Clean Cath UA that was negative. Holding IV Lasix in setting of Hypokalemia. - Cards following, appreciate recs - Hold IV Lasix - Repeat BMP this evening - Gave 1 dose of Metolazone 5 mg - Hold metoprolol succinate 50 mg - strict I/O - daily weights - f/u mag in AM - Daily updates for daughter  Hypokalemia K-2.8 this AM.  Holding Lasix this AM.   - Oral K 40 mg q4hr - Repeat BMP this afternoon - Mg level ordered  Dementia with deliriumI Anxiety Patient was calm and cooperative this morning during evaluation.  Has been redirectable most of the time but has been hostile to the [employer] and nurses.  Palliative spoke with Daughter on 5/18 and daughter indicated she does not wish to change GoC at this time. As per daughter noted to have agitation and anxiety with hallucinations. Patient had sitter overnight and this morning.  Full Diet.  1:1 sitter currently in place.  - palliative care following, appreciate involvement with [place] discussion - continue 1:1 sitter,  redirect if possible -C/w withDepakote125 mg TID - continue aripriprazole 5 mg daily -hold home Buspar -c/wramelteon 8 mg qhs - avoid antipsychotics and/or benzodiazepines if possible given prolonged QTc (QTc 508 this morning) and age - Daily updates for daughter - delirium precautions  Cellulitis of right 4th toe Toe had wrapping today and did not remove.  Still some swelling and tender to palpation. - Remains afebrile and without leukocytosis. Antibiotics initiated on 5/14. - cephalexin 500 mg q8h (5/14-5/20) - consider colchicine if no improvement with antibiotics (though would be careful given CKD)  Chronic Back Pain Patient intermittently complains about.  Able to be distracted from and no issues with ambulation. - Scheduled Tylenol 650 mg q6hr  Tick bite Received 1 dose oral doxycycline 5/16. - No further treatment necessary  CKD Stage IV Likely progression of CKD given stable Creatinine of 2.5. - Daily RFP - See diuresis per above  Mitral valve regurgitation Patient is s/p MitraClip 02/03/2019. TTE this admission revealing moderate to severe mitral valve regurgitation, MitraClip is in place. Underlying valve disorder possibly contributing to CHF.  Insomnia Likely related to dementia. Trazodone discontinued due to concerns of hallucinations. - ramelteon as above  GERD - continue PPI  HTN Normotensive to soft. - Holding home Metoprolol  HLD - continue home statin  Incidental pulmonary nodule Seen on CTA 09/2019.  - repeat outpatient June 2022  Candidal Intertrigo Noted on admission exam under breasts and in groin - nystatin ointment BID  FEN/GI: Heart Healthy Diet PPx: Lovenox   Status is: Inpatient  Remains inpatient appropriate because:IV treatments appropriate due to intensity of illness or inability  to take PO   Dispo: The patient is from: Home              Anticipated d/c is to: Home              Patient currently is not  medically stable to d/c.   Difficult to place patient No     Subjective:  Patient complaining of severe back pain.    Objective: Temp:  [97.8 F (36.6 C)-98.7 F (37.1 C)] 97.8 F (36.6 C) (05/18 0514) Pulse Rate:  [110-112] 112 (05/18 0514) Resp:  [16-18] 16 (05/18 0514) BP: (107-115)/(66-79) 107/66 (05/18 0514) SpO2:  [93 %-97 %] 93 % (05/18 0514) Weight:  [76.4 kg] 76.4 kg (05/18 0500) Physical Exam:  Physical Exam Constitutional:      General: She is not in acute distress.    Appearance: She is not ill-appearing.  HENT:     Head: Normocephalic and atraumatic.     Mouth/Throat:     Mouth: Mucous membranes are moist.  Cardiovascular:     Rate and Rhythm: Normal rate and regular rhythm.  Pulmonary:     Effort: Pulmonary effort is normal.     Breath sounds: Rales present.     Comments: Rales in bilateral lower lobes Musculoskeletal:     Right lower leg: Edema present.     Left lower leg: Edema present.     Comments: 2+ pitting edema, slightly reduced today from yesterday  Feet:     Comments: Toe had wrapping today and did not remove.  Still some swelling and tender to palpation. Skin:    General: Skin is warm.  Neurological:     Mental Status: She is alert.     Laboratory: Recent Labs  Lab 08/31/20 0212 09/01/20 0159 09/02/20 0457  WBC 6.1 5.9 4.8  HGB 12.7 11.8* 12.1  HCT 40.1 38.2 38.6  PLT 196 169 179   Recent Labs  Lab 09/02/20 0457 09/03/20 0200 09/04/20 0451  NA 138 138 140  K 3.6 3.5 2.8*  CL 98 100 99  CO2 28 23 27   BUN 68* 66* 66*  CREATININE 2.45* 2.43* 2.43*  CALCIUM 9.0 9.2 9.7  GLUCOSE 147* 144* 132*    Imaging/Diagnostic Tests: No new imaging  Delora Fuel, MD 09/04/2020, 6:59 AM PGY-1, [place] Intern pager: [phone], text pages welcome

## 2020-09-04 NOTE — Progress Notes (Addendum)
Physical Therapy  Patient Details Name: Hannah [name] MRN: [id number] DOB: [birth year] Today's Date: 09/04/2020   History of Present Illness  75 y.o. female referred to [facility] by PCP after labs showed an AKI, and in the setting of a suspected CHF exacerbation. Pt  presents with BLE edema, AKI, and right 4th toe redness. Pt was diagnosed with acute on chronic CHF exacerbation, cellulitis of R 4th toe 5/11. PMH includes chronic HFrEF, HTN, dementia, GI bleed s/p mesenteric artery coil embolization, anxiety, severe mitral regurgitation s/p MitraClip 02/02/19, symtpomatic anemia, insomnia.  Clinical Impression  Pt demonstrates ability to perform transfers and ambulation without requiring physical assistance. Pt tolerates ambulation of household distances with RW and without an AD. Pt reaches for furniture for UE support to provide additional stability when not using an AD, but demonstrates improved stability with use of RW during gait activities. Pt continues to demonstrate deficits in overall strength, endurance, power, balance, and activity tolerance and is limited by reports of back pain. Pt will benefit from continued acute PT to improve safety and independence in mobility.    Follow Up Recommendations Home health PT;Supervision/Assistance - 24 hour    Equipment Recommendations  None recommended by PT    Recommendations for Other Services       Precautions / Restrictions Precautions Precautions: Fall Restrictions Weight Bearing Restrictions: No      Mobility  Bed Mobility               General bed mobility comments: Pt received in recliner.    Transfers Overall transfer level: Needs assistance Equipment used: Rolling walker (2 wheeled) Transfers: Sit to/from Stand Sit to Stand: Min guard         General transfer comment: min G for safety.  Ambulation/Gait Ambulation/Gait assistance: Min guard Gait Distance (Feet): 25 Feet (x 2 bouts) Assistive device: None;Rolling  walker (2 wheeled) Gait Pattern/deviations: Step-through pattern;Decreased stride length;Trunk flexed Gait velocity: decreased Gait velocity interpretation: 1.31 - 2.62 ft/sec, indicative of limited community ambulator General Gait Details: First bout of gait performed with RW, second bout without an AD and reaches for furniture with R UE for stability. Pt demonstrates improved stability during gait with RW.        Balance Overall balance assessment: Needs assistance Sitting-balance support: Feet supported;No upper extremity supported;Single extremity supported Sitting balance-Leahy Scale: Good     Standing balance support: During functional activity;No upper extremity supported;Bilateral upper extremity supported;Single extremity supported Standing balance-Leahy Scale: Poor Standing balance comment: Pt tolerates brief periods of standing/gait without UE support, but quickly reaches for furniture for stability.                             Pertinent Vitals/Pain Pain Assessment: Faces Faces Pain Scale: Hurts little more Pain Location: back Pain Descriptors / Indicators: Discomfort;Grimacing Pain Intervention(s): Monitored during session;Limited activity within patient's tolerance                                                                     Cognition Arousal/Alertness: Awake/alert Behavior During Therapy: WFL for tasks assessed/performed Overall Cognitive Status: Impaired/Different from baseline Area of Impairment: Following commands;Safety/judgement  Following Commands: Follows one step commands consistently Safety/Judgement: Decreased awareness of deficits;Decreased awareness of safety     General Comments: Hx of dementia. Pt following one step commands consistently. '      General Comments General comments (skin integrity, edema, etc.): Pt reports to be in back pain, reporting limitations in  ability to participate in therapy. Pt agrees to ambulate to room door and back to recliner.    Exercises General Exercises - Lower Extremity Long Arc Quad: Both;5 reps                          PT Goals (Current goals can be found in the Care Plan section)  Acute Rehab PT Goals Patient Stated Goal: Go home    Frequency Min 3X/week   Barriers to discharge                       AM-PAC PT "6 Clicks" Mobility  Outcome Measure Help needed turning from your back to your side while in a flat bed without using bedrails?: A Little Help needed moving from lying on your back to sitting on the side of a flat bed without using bedrails?: A Little Help needed moving to and from a bed to a chair (including a wheelchair)?: A Little Help needed standing up from a chair using your arms (e.g., wheelchair or bedside chair)?: A Little Help needed to walk in hospital room?: A Little Help needed climbing 3-5 steps with a railing? : A Lot 6 Click Score: 17    End of Session Equipment Utilized During Treatment: Gait belt Activity Tolerance: Patient limited by pain Patient left: in chair;with call bell/phone within reach;with nursing/sitter in room Nurse Communication: Mobility status PT Visit Diagnosis: Muscle weakness (generalized) (M62.81);Other abnormalities of gait and mobility (R26.89);Pain Pain - Right/Left:  (back) Pain - part of body:  (back)    Time: 6144-3154 PT Time Calculation (min) (ACUTE ONLY): 13 min   Charges: $Gait Training: 8-22 mins             Acute Rehab  Pager: [phone]   Garwin Brothers, SPT  09/04/2020, 1:50 PM

## 2020-09-04 NOTE — Progress Notes (Signed)
Interim Progress Note  S: paged by RN that patient was complaining of urinary discomfort when trying to void. 200cc on bladder scan. In to see patient, she had just had in and out cath done with about 300cc. On arrival she was complaining of urinary pain but after in and out cath she denied any pain. Patient is demented, able to tell me she is at [facility] but unaware of why or the time. She was stating that she has to sleep because she has to work in the morning.   O:  Blood pressure 112/79, pulse (!) 112, temperature 97.8 F (36.6 C), temperature source Oral, resp. rate 18, height 5\' 2"  (1.575 m), weight 76.4 kg, SpO2 96 %. Gen: Awake, alert, oriented to self and place only, in no distress, redirectable   A/P:  75 y/o F with hx of vascular dementia admitted for CHF exacerbation. Metolazone added yesterday to help with diuresis. Dementia could be contributing to her reported pain as she did not have evidence of urinary retention on bladder scan. Avoiding antispasmodics due to anticholinergic properties. Patient was redirectable for me during my encounter. Will send urine for UA and UCx given increased discomfort. Appreciate RN and NT care for this patient.   -UA, UCx -Continue to attempt to redirect when patient is agitated  -Continue to diurese, plan per day team note

## 2020-09-04 NOTE — Progress Notes (Signed)
Heart Failure Stewardship Pharmacist Progress Note   PCP: [name], MD PCP-Cardiologist: Jenkins Rouge, MD    HPI:  75 yo F with PMH of CHF, severe MR s/p MitraClip in 2020, dementia, GI bleeding, and CKD. She was admitted on 08/30/20 for LE edema and CHF exacerbation. A CXR was done on 08/31/20 and found to have mild CHF. An ECHO was also done on 08/31/20 and LVEF was <20% (20-25% in 01/2020, 30-35% in 02/2019, and 60-65% in 10/2018).   Current HF Medications: Furosemide 120 mg IV BID - Holding due to hypokalemia   Prior to admission HF Medications: Furosemide 80 mg daily Metoprolol XL 50 mg daily  Pertinent Lab Values: . Serum creatinine 2.43, BUN 66, Potassium 2.8, Sodium 140, Magnesium 1.9, BNP >4500  Vital Signs: . Weight: 168.4 lbs (admission weight: 172 lbs) . Blood pressure: 100-110/60-70s . Heart rate: 90-100s . I/O: neg 1L yesterday, net neg 300 mL since admission   Medication Assistance / Insurance Benefits Check: Does the patient have prescription insurance?  Yes Type of insurance plan: [employer] Medicare  Does the patient qualify for medication assistance through manufacturers or grants?   Pending . Eligible grants and/or patient assistance programs: pending . Medication assistance applications in progress: none  . Medication assistance applications approved: none Approved medication assistance renewals will be completed by: pending  Outpatient Pharmacy:  Prior to admission outpatient pharmacy: [facility] Is the patient willing to use [facility] at discharge? Yes Is the patient willing to transition their outpatient pharmacy to utilize a [facility] outpatient pharmacy?   Pending    Assessment: 1. Acute on chronic systolic CHF (EF <59%), due to presumed NICM. NYHA class III symptoms. Also with AKI - SCr 2.5 range (was ~1.5 prior to this admission). - Holding furosemide today due to significantly hypokalemia. 3+ pitting edema on exam. Patient receiving  potassium supplementation.  - Consider resuming PTA metoprolol XL as volume improves, prior to discharge  - AKI limits ACE/ARB/ARNI, spironolactone, or SGLT2i at this time   Plan: 1) Medication changes recommended at this time: - Continue current regimen; no recommendations at this time  2) Patient assistance: - Entresto copay $45 per month - Jardiance copay $45 per month - Farxiga copay $95 per month  3)  Education  - To be completed prior to discharge   Harriet Pho, PharmD PGY-1 Community Pharmacy Resident  09/04/2020 1:54 PM  Kerby Nora, PharmD, BCPS Heart Failure Stewardship Pharmacist Phone [phone]

## 2020-09-04 NOTE — Progress Notes (Addendum)
Progress Note  [name] Date of Encounter: 09/04/2020  [facility] Cardiologist: Jenkins Rouge, MD   Subjective   Feels "poor". Breathing stable. Had urinary discomfort last night s/p I & O cath 300cc.   Inpatient Medications    Scheduled Meds: . ARIPiprazole  5 mg Oral Daily  . atorvastatin  80 mg Oral Daily  . cephALEXin  500 mg Oral Q8H  . divalproex  125 mg Oral TID  . enoxaparin (LOVENOX) injection  30 mg Subcutaneous Q24H  . lidocaine  2 patch Transdermal Q24H  . nystatin ointment   Topical BID  . pantoprazole  40 mg Oral Daily  . polyethylene glycol  17 g Oral Daily  . potassium chloride  40 mEq Oral Q3H  . ramelteon  8 mg Oral QHS  . sodium chloride flush  3 mL Intravenous Q12H   Continuous Infusions: . sodium chloride     PRN Meds: sodium chloride, acetaminophen, sodium chloride flush   Vital Signs    Vitals:   09/03/20 2039 09/04/20 0000 09/04/20 0500 09/04/20 0514  BP: 112/79   107/66  Pulse: (!) 112   (!) 112  Resp: 18   16  Temp: 97.8 F (36.6 C)   97.8 F (36.6 C)  TempSrc: Oral   Oral  SpO2: 96%   93%  Weight:  76.4 kg 76.4 kg   Height:        Intake/Output Summary (Last 24 hours) at 09/04/2020 1020 Last data filed at 09/04/2020 0836 Gross per 24 hour  Intake 240 ml  Output 650 ml  Net -410 ml   Last 3 Weights 09/04/2020 09/04/2020 09/03/2020  Weight (lbs) 168 lb 6.4 oz 168 lb 6.4 oz 170 lb  Weight (kg) 76.386 kg 76.386 kg 77.111 kg      Telemetry    N/A  ECG  N/A  Physical Exam   GEN: No acute distress.   Neck: No JVD Cardiac: RRR, no murmurs, rubs, or gallops.  Respiratory: Clear to auscultation bilaterally. GI: Soft, nontender, non-distended  MS: 2+ edema; No deformity. Neuro:  Nonfocal  Psych: Normal affect   Labs    High Sensitivity Troponin:   Recent Labs  Lab 08/31/20 0652 08/31/20 0840 09/03/20 0200 09/03/20 0332  TROPONINIHS 72* 77* 122* 127*      Chemistry Recent Labs  Lab  09/02/20 0457 09/03/20 0200 09/04/20 0451  NA 138 138 140  K 3.6 3.5 2.8*  CL 98 100 99  CO2 28 23 27   GLUCOSE 147* 144* 132*  BUN 68* 66* 66*  CREATININE 2.45* 2.43* 2.43*  CALCIUM 9.0 9.2 9.7  GFRNONAA 20* 20* 20*  ANIONGAP 12 15 14      Hematology Recent Labs  Lab 08/31/20 0212 09/01/20 0159 09/02/20 0457  WBC 6.1 5.9 4.8  RBC 3.97 3.75* 3.86*  HGB 12.7 11.8* 12.1  HCT 40.1 38.2 38.6  MCV 101.0* 101.9* 100.0  MCH 32.0 31.5 31.3  MCHC 31.7 30.9 31.3  RDW 14.6 14.5 14.5  PLT 196 169 179    BNP Recent Labs  Lab 08/31/20 0212  BNP >4,500.0*      Radiology    No results found.  Cardiac Studies   Echo 08/31/2020 1. Limited exam as patient unable to cooperate.  2. Left ventricular ejection fraction, by estimation, is <20%. The left  ventricle has severely decreased function. The left ventricle demonstrates  global hypokinesis. The left ventricular internal cavity size was severely  dilated. There is mild  left  ventricular hypertrophy. Left ventricular diastolic function could not be  evaluated.  3. Right ventricular systolic function is moderately reduced. The right  ventricular size is moderately enlarged.  4. There is a Mitra-Clip present in the mitral position. Mild to moderate  mitral valve stenosis, MG 6 mmHg at HR 98 bpm which is stable from prior  echo and likely partly elevated due to significant MR. Moderate to severe  mitral valve regurgitation.  Limited images, but MR appears moderate to severe.  5. The aortic valve was not well visualized. Aortic valve regurgitation  is not visualized. AV gradients were not assessed to evaluate for AS  Patient Profile     75 y.o. female with a hx of chronic combined systolic and diastolic heart failure (EF 20 to 25%), severe mitral regurgitation status post MitraClip 02/02/2019, dementia, GI bleeding,CKDseen for CHF.   Assessment & Plan    1. Acute on chronic combined systolic and diastolic heart  failure: - Presented with volume overload, BNP greater than 4500.  - Echocardiogram 5/14with limited images as patient was unable to cooperate with exam, but showed EF less than 20%, moderately reduced RV function, moderate to severe MR, mild to moderate MS. - I & O not accurate  - Weight 174>>168lb since admit. 2lb loss in past 24 hours.  - Breathing improving.  - Scr stable - Continue IV diuresis. Was given metolozone yesterday.  - Restart home Toprol XL once better volume status  - No ACE/ARB/ARNI, spironolactone, or SGLT2i due to CKD - Add compression stocking   2. AKI:  - Scr stable at 2.4  (was 1.2- 1.3 in January)  3. Troponin elevation:  - Likely demand ischemia   4. Tachycardia - HR 110s by vital - EKG on admit Sinus tachy  - Not on tele due to dementia.  For questions or updates, please contact [place] Please consult [email or web address] for contact info under        Signed, Leanor Kail, PA  09/04/2020, 10:20 AM    I have seen and examined the patient along with Leanor Kail, PA .  I have reviewed the chart, notes and new data.  I agree with PA/NP's note.  Key new complaints: not feeling great, but no dyspnea at rest. Key examination changes: 3+ symmetrical soft pitting edema to the knees Key new findings / data: severely hypokalemic  PLAN: Hypokalemia probably explains why she feels poorly. Hold diuretics temporarily while we correct this.  Sanda Klein, MD, [place] [phone] 09/04/2020, 11:45 AM

## 2020-09-04 NOTE — TOC Progression Note (Signed)
Transition of Care [facility]) - Progression Note    Patient Details  Name: ARGENTINA KOSCH MRN: [id number] Date of Birth: Aug 22, 1945  Transition of Care [facility]) CM/SW Contact  Zenon Mayo, RN Phone Number: 09/04/2020, 4:49 PM  Clinical Narrative:    NCM spoke with daughter, she states she will come to hospital today and look at the [facility] list and then she will need to go over with her brother because she can not make this decision alone he will have to be involved.  NCM informed her that is ok for her to do and a NCM will follow up with her.    Expected Discharge Plan: [place] Barriers to Discharge: Continued Medical Work up  Expected Discharge Plan and Services Expected Discharge Plan: [place] In-house Referral: Hospice / Palliative Care Discharge Planning Services: CM Consult Post Acute Care Choice: [place] arrangements for the past 2 months: Single Family Home                                       Social Determinants of Health (SDOH) Interventions    Readmission Risk Interventions Readmission Risk Prevention Plan 02/03/2019  Transportation Screening Complete  Medication Review [employer]) Complete  PCP or Specialist appointment within 3-5 days of discharge Complete  HRI or [place] Complete  SW Recovery Care/Counseling Consult Complete  [place] Not Applicable  Some recent data might be hidden

## 2020-09-04 NOTE — Progress Notes (Signed)
Pt refused bladder scan

## 2020-09-05 ENCOUNTER — Inpatient Hospital Stay (HOSPITAL_COMMUNITY): Payer: Medicare HMO

## 2020-09-05 DIAGNOSIS — R7989 Other specified abnormal findings of blood chemistry: Secondary | ICD-10-CM | POA: Diagnosis not present

## 2020-09-05 DIAGNOSIS — R531 Weakness: Secondary | ICD-10-CM | POA: Diagnosis not present

## 2020-09-05 DIAGNOSIS — F39 Unspecified mood [affective] disorder: Secondary | ICD-10-CM

## 2020-09-05 DIAGNOSIS — I5043 Acute on chronic combined systolic (congestive) and diastolic (congestive) heart failure: Secondary | ICD-10-CM | POA: Diagnosis not present

## 2020-09-05 DIAGNOSIS — R410 Disorientation, unspecified: Secondary | ICD-10-CM

## 2020-09-05 DIAGNOSIS — R443 Hallucinations, unspecified: Secondary | ICD-10-CM

## 2020-09-05 DIAGNOSIS — R600 Localized edema: Secondary | ICD-10-CM | POA: Diagnosis not present

## 2020-09-05 DIAGNOSIS — F0390 Unspecified dementia without behavioral disturbance: Secondary | ICD-10-CM

## 2020-09-05 DIAGNOSIS — I340 Nonrheumatic mitral (valve) insufficiency: Secondary | ICD-10-CM | POA: Diagnosis not present

## 2020-09-05 LAB — BASIC METABOLIC PANEL
Anion gap: 15 (ref 5–15)
BUN: 69 mg/dL — ABNORMAL HIGH (ref 8–23)
CO2: 26 mmol/L (ref 22–32)
Calcium: 9.6 mg/dL (ref 8.9–10.3)
Chloride: 100 mmol/L (ref 98–111)
Creatinine, Ser: 2.65 mg/dL — ABNORMAL HIGH (ref 0.44–1.00)
GFR, Estimated: 18 mL/min — ABNORMAL LOW (ref >60)
Glucose, Bld: 130 mg/dL — ABNORMAL HIGH (ref 70–99)
Potassium: 4 mmol/L (ref 3.5–5.1)
Sodium: 141 mmol/L (ref 135–145)

## 2020-09-05 LAB — URINE CULTURE: Culture: NO GROWTH

## 2020-09-05 LAB — GLUCOSE, CAPILLARY: Glucose-Capillary: 136 mg/dL — ABNORMAL HIGH (ref 70–99)

## 2020-09-05 MED ORDER — OXYCODONE HCL 5 MG PO TABS
2.5000 mg | ORAL_TABLET | Freq: Once | ORAL | Status: AC
  Administered 2020-09-05: 2.5 mg via ORAL
  Filled 2020-09-05: qty 1

## 2020-09-05 MED ORDER — POTASSIUM CHLORIDE CRYS ER 20 MEQ PO TBCR: 40.0000 meq | EXTENDED_RELEASE_TABLET | Freq: Two times a day (BID) | ORAL | Status: DC

## 2020-09-05 MED ORDER — ARIPIPRAZOLE 5 MG PO TABS
7.5000 mg | ORAL_TABLET | Freq: Every day | ORAL | Status: DC
  Administered 2020-09-06 – 2020-09-07 (×2): 7.5 mg via ORAL
  Filled 2020-09-05 (×2): qty 2

## 2020-09-05 MED ORDER — TORSEMIDE 100 MG PO TABS
50.0000 mg | ORAL_TABLET | Freq: Two times a day (BID) | ORAL | Status: DC
  Administered 2020-09-05 – 2020-09-06 (×2): 50 mg via ORAL
  Filled 2020-09-05 (×3): qty 0.5

## 2020-09-05 MED ORDER — FUROSEMIDE 10 MG/ML IJ SOLN
120.0000 mg | Freq: Two times a day (BID) | INTRAVENOUS | Status: DC
  Administered 2020-09-05: 120 mg via INTRAVENOUS
  Filled 2020-09-05: qty 2
  Filled 2020-09-05 (×2): qty 12

## 2020-09-05 MED ORDER — POTASSIUM CHLORIDE CRYS ER 20 MEQ PO TBCR
40.0000 meq | EXTENDED_RELEASE_TABLET | Freq: Once | ORAL | Status: AC
  Administered 2020-09-05: 40 meq via ORAL
  Filled 2020-09-05: qty 2

## 2020-09-05 NOTE — Progress Notes (Signed)
Pt refusing IV. Pt was educated on importance on taking med. Pt continues to refuse IV.

## 2020-09-05 NOTE — Progress Notes (Signed)
Heart Failure Stewardship Pharmacist Progress Note   PCP: [name], MD PCP-Cardiologist: Jenkins Rouge, MD    HPI:  75 yo F with PMH of CHF, severe MR s/p MitraClip in 2020, dementia, GI bleeding, and CKD. She was admitted on 08/30/20 for LE edema and CHF exacerbation. A CXR was done on 08/31/20 and found to have mild CHF. An ECHO was also done on 08/31/20 and LVEF was <20% (20-25% in 01/2020, 30-35% in 02/2019, and 60-65% in 10/2018).   Current HF Medications: Furosemide 120 mg IV BID  Prior to admission HF Medications: Furosemide 80 mg daily Metoprolol XL 50 mg daily  Pertinent Lab Values: . Serum creatinine 2.43>2.65, BUN 69, Potassium 4.0, Sodium 141, Magnesium 1.9, BNP >4500  Vital Signs: . Weight: 166 lbs (admission weight: 172 lbs) . Blood pressure: 110/70s . Heart rate: 100s . I/O: neg 1.4L yesterday, net neg 1.7 mL since admission   Medication Assistance / Insurance Benefits Check: Does the patient have prescription insurance?  Yes Type of insurance plan: [employer] Medicare  Does the patient qualify for medication assistance through manufacturers or grants?   Pending . Eligible grants and/or patient assistance programs: pending . Medication assistance applications in progress: none  . Medication assistance applications approved: none Approved medication assistance renewals will be completed by: pending  Outpatient Pharmacy:  [facility] Is the patient willing to use [facility] at discharge? Yes Is the patient willing to transition their outpatient pharmacy to utilize a [facility] outpatient pharmacy?   Pending    Assessment: 1. Acute on chronic systolic CHF (EF <87%), due to presumed NICM. NYHA class III symptoms. Also with AKI - SCr 2.5 range (was ~1.5 prior to this admission). - Continue furosemide 120 mg IV BID - Consider resuming PTA metoprolol XL as volume improves, prior to discharge  - AKI limits  ACE/ARB/ARNI, spironolactone, or SGLT2i at this time   Plan: 1) Medication changes recommended at this time: - Continue current regimen  2) Patient assistance: - Entresto copay $45 per month - Jardiance copay $45 per month - Farxiga copay $95 per month  3)  Education  - To be completed prior to discharge  Kerby Nora, PharmD, BCPS Heart Failure Stewardship Pharmacist Phone [phone]

## 2020-09-05 NOTE — Progress Notes (Signed)
FPTS Interim Progress Note  Spoke with patient's daughter.  Gave update about obtaining Renal Ultrasound and Psych consult.  Delora Fuel, MD 09/05/2020, 4:57 PM PGY-1, [place] Medicine Service pager [phone]

## 2020-09-05 NOTE — Progress Notes (Signed)
Family Medicine Teaching Service Daily Progress Note Intern Pager: [phone]  [name] Medical record number: [id number] Date of birth: May 26, 1945 Age: 75 y.o. Gender: female  Primary Care Provider: [name], MD Consultants: Cards Code Status: Full  Pt Overview and Major Events to Date:  5/14 Admitted  Assessment and Plan: Hannah Forbush Robertsis a 75 y.o.femalewho presents with bilateral lower extremity edema secondary to HFrEF exacerbation. PMHx significant for HFrEF, severe MVR (s/p MitraClip 01/2019), dementia, GI bleed s/p mesenteric artery coil embolization, symptomatic anemia, insomnia.  HFrEF exacerbation Patient IV Furosemide held yesterday in setting of Hypokalemia.  Had urinary output of 1177.   Gave IV Furosemide 120 mg this morning.  Will check K this afternoon.  Down another 0.7 kg from yesterday.  Continue to diurese.   - Cards following, appreciate recs - Repeat BMP this evening, then give Oral Lasix dose if normal - Holdmetoprolol succinate 50 mg - strict I/O - daily weights - Daily updates for daughter  Hypokalemia K-4.0 this AM.  Give new dose and repeat BMP this evening - Oral K 40 mg q4hr - Repeat BMP this afternoon - Mg level ordered  Dementia with deliriumI Anxiety Patient wascalm and cooperativethis morning during evaluation. Has been redirectable most of the time but has been hostile to the [employer] and nurses. Palliative spoke with Daughter on 5/18 and daughter indicated she does not wish to change GoC at this time. As per daughter noted to have agitation and anxiety with hallucinations. Patient had sitter overnight and this morning.Full Diet.1:1 sitter currently in place.  - palliative care following, appreciate involvement with [facility] [place] discussion - continue 1:1 sitter, redirect if possible - C/w withDepakote125 mg TID - continue aripriprazole 5 mg daily -holdhome Buspar -c/wramelteon 8 mg qhs - avoid  antipsychotics and/or benzodiazepines if possible given prolonged QTc (QTc 508 this morning) and age - Daily updates for daughter - delirium precautions - Plan to discuss with Psych  - Obtain Depakote level  Cellulitis of right 4th toe - Remains afebrile and without leukocytosis. Antibiotics initiated on 5/14. - cephalexin 500 mg q8h (5/14-5/20) - consider colchicine if no improvement with antibiotics (though would be careful givenCKD)  Chronic Back Pain Patient intermittently complains about.  Able to be distracted from and no issues with ambulation. - Scheduled Tylenol 650 mg q6hr per Pharmacy recs  Tick bite Received 1 dose oral doxycycline 5/16. - No further treatment necessary  CKD Stage IV Likely progression of CKD given stable Creatinine of 2.5. - Daily BMP - See diuresis per above  Mitral valve regurgitation Patient is s/p MitraClip 02/03/2019. TTE this admission revealing moderate to severe mitral valve regurgitation, MitraClip is in place. Underlying valve disorder possibly contributing to CHF.  Insomnia Likely related to dementia. Trazodone discontinued due to concerns of hallucinations. - ramelteon as above  GERD - continue PPI  HTN Normotensive to soft. -Holding home Metoprolol  HLD - continue home statin  Incidental pulmonary nodule Seen on CTA 09/2019.  - repeat outpatient June 2022  Candidal Intertrigo Noted on admission exam under breasts and in groin. - Plan to re-examine tomorrow - nystatin ointment BID  FEN/GI: Heart Healthy Diet PPx: Lovenox   Status is: Inpatient  Remains inpatient appropriate because:IV treatments appropriate due to intensity of illness or inability to take PO   Dispo: The patient is from: Home              Anticipated d/c is to: Home  Patient currently is not medically stable to d/c.   Difficult to place patient No    Subjective:  Patient able to be redirected and calmed down  today.  Objective: Temp:  [97.8 F (36.6 C)-98 F (36.7 C)] 97.8 F (36.6 C) (05/18 1930) Pulse Rate:  [115-121] 115 (05/19 0546) Resp:  [18-20] 18 (05/19 0546) BP: (99-113)/(52-76) 108/76 (05/19 0546) SpO2:  [95 %-96 %] 95 % (05/19 0546) Weight:  [75.7 kg] 75.7 kg (05/19 0134) Physical Exam:  Physical Exam Constitutional:      Appearance: Normal appearance.  HENT:     Head: Normocephalic and atraumatic.     Mouth/Throat:     Mouth: Mucous membranes are moist.  Cardiovascular:     Rate and Rhythm: Normal rate and regular rhythm.  Pulmonary:     Effort: Pulmonary effort is normal.     Breath sounds: Normal breath sounds.  Musculoskeletal:     Right lower leg: Edema present.     Left lower leg: Edema present.     Comments: 3+ pitting edema bilaterally  Feet:     Comments: Toe less swolen and non-tender to palpation today, still some redness and swelling Neurological:     Mental Status: She is alert.     Laboratory: Recent Labs  Lab 08/31/20 0212 09/01/20 0159 09/02/20 0457  WBC 6.1 5.9 4.8  HGB 12.7 11.8* 12.1  HCT 40.1 38.2 38.6  PLT 196 169 179   Recent Labs  Lab 09/04/20 0451 09/04/20 1948 09/05/20 0318  NA 140 140 141  K 2.8* 4.1 4.0  CL 99 102 100  CO2 27 24 26   BUN 66* 66* 69*  CREATININE 2.43* 2.57* 2.65*  CALCIUM 9.7 9.9 9.6  GLUCOSE 132* 155* 130*     Imaging/Diagnostic Tests: No new imaging  Delora Fuel, MD 09/05/2020, 6:58 AM PGY-1, [place] Intern pager: [phone], text pages welcome

## 2020-09-05 NOTE — Progress Notes (Signed)
FPTS Interim Progress Note  S:Went to assess patient.  Was restless and requesting something for pain.  She had received 2 doses of Oxy IR 2.5mg .  Spoke with daughter [name] today.  Updated about pain status.  She was ok with giving another dose of Oxy 2.5 mg.  She would like her mom to remain pain free as much as possible.  Updated about lab results.  Potassium now back to within range.  Weight slowly decreasing.  Discussed restarting lasix to continue diuresing and monitoring potassium.  [name] is also requesting to look into having a repeat ECHO completed given the first one was limited with patient moving. I did tell her that we will discuss this to see if she would benefit from reimaging at this time given her dementia.  Will check with team and let her know.  O: BP 108/76 (BP Location: Right Arm)   Pulse (!) 115   Temp 97.8 F (36.6 C) (Oral)   Resp 18   Ht 5\' 2"  (1.575 m)   Wt 75.7 kg Comment: scale a  SpO2 95%   BMI 30.52 kg/m     A/P: Plan to give Lasix 120 mg BID x 2 KDur 58mEg at noon Recheck BMP at 2200 Oxycodone 2.5 mg x1.  Consider scheduling as TID prn for pain management.   [name] appreciative of phone call.  Will have team update if any changes.  Carollee Leitz, MD 09/05/2020, 9:31 AM PGY-2, [place] Medicine Service pager [phone]

## 2020-09-05 NOTE — Consult Note (Signed)
   [facility] Inpatient Consult   09/05/2020  MEGHA AGNES [birth year] [id number]   [facility] [ACO] Patient: [employer]  Patient was screened for [place] Management services. Patient will have the transition of care call conducted by the primary care provider at [facility] an Embedded practice which has a chronic disease management Embedded Care Management team.  Chart review of pain control needs and symptom management, patient may benefit from a palliative consult for pain control and symptom management support and to be discussed with inpatient [facility] team.  Plan: Continue to follow as patient can be referred to the Embedded Chronic Care Management team for post hospital care and disease management.   Please contact for further questions,  Natividad Brood, RN BSN [facility] Liaison  [phone] business mobile phone Toll free office [phone]  Fax number: [phone] [email or web address]

## 2020-09-05 NOTE — Progress Notes (Signed)
Patient anxious and nervous all night. States she's scared and worried "people are going to get her". Patient constantly yelling and screaming and asking for people to help her and asking for anyone to sit beside her bed and hold her hand. Constantly asking to get up to the commode and as soon as she gets on the commode she states she doesn't know what to do and then  she states she's tired and wants to go to bed, and has repeated this cycle all night. Efforts to comfort, console, and reorient/reassure patient have been futile.

## 2020-09-05 NOTE — Care Management Important Message (Signed)
Important Message  Patient Details  Name: Hannah [name] MRN: [id number] Date of Birth: December 19, 1945   Medicare Important Message Given:  Yes     [name] 09/05/2020, 8:59 AM

## 2020-09-05 NOTE — Progress Notes (Signed)
Progress Note  [name] Date of Encounter: 09/05/2020  [facility] Cardiologist: Jenkins Rouge, MD   Subjective   Moderately agitated this morning, crying out that she "wants to go home right now". Started screaming shortly after I left the room. Potassium normalized.  Renal function essentially unchanged.  Weight is down 9 pounds from peak weight on this admission. Still has 2+ pretibial edema.  Inpatient Medications    Scheduled Meds: . acetaminophen  650 mg Oral Q6H  . ARIPiprazole  5 mg Oral Daily  . atorvastatin  80 mg Oral Daily  . cephALEXin  500 mg Oral Q8H  . diclofenac Sodium  2 g Topical QID  . divalproex  125 mg Oral TID  . enoxaparin (LOVENOX) injection  30 mg Subcutaneous Q24H  . lidocaine  2 patch Transdermal Q24H  . nystatin ointment   Topical BID  . pantoprazole  40 mg Oral Daily  . polyethylene glycol  17 g Oral Daily  . ramelteon  8 mg Oral QHS  . sodium chloride flush  3 mL Intravenous Q12H   Continuous Infusions: . sodium chloride    . furosemide 120 mg (09/05/20 0918)   PRN Meds: sodium chloride, sodium chloride flush   Vital Signs    Vitals:   09/04/20 1039 09/04/20 1930 09/05/20 0134 09/05/20 0546  BP: 113/75 (!) 99/52  108/76  Pulse: (!) 119 (!) 121  (!) 115  Resp: 20 18  18   Temp: 98 F (36.7 C) 97.8 F (36.6 C)    TempSrc: Oral Oral    SpO2: 95% 96%  95%  Weight:   75.7 kg   Height:        Intake/Output Summary (Last 24 hours) at 09/05/2020 0957 Last data filed at 09/05/2020 0752 Gross per 24 hour  Intake 240 ml  Output 1227 ml  Net -987 ml   Last 3 Weights 09/05/2020 09/04/2020 09/04/2020  Weight (lbs) 166 lb 14.2 oz 168 lb 6.4 oz 168 lb 6.4 oz  Weight (kg) 75.7 kg 76.386 kg 76.386 kg      Telemetry    Not wearing leads- Personally Reviewed  ECG    No new tracing- Personally Reviewed  Physical Exam  Agitated, confused, oriented to self only GEN: No acute distress.   Neck:  Unable to see  JVD Cardiac: RRR, tachycardia, holosystolic murmurs at the apex and left lower sternal border, rubs, or gallops.  Respiratory: Clear to auscultation bilaterally. GI: Soft, nontender, non-distended  MS: No edema; No deformity. Neuro:  Nonfocal  Psych: Normal affect   Labs    High Sensitivity Troponin:   Recent Labs  Lab 08/31/20 0652 08/31/20 0840 09/03/20 0200 09/03/20 0332  TROPONINIHS 72* 77* 122* 127*      Chemistry Recent Labs  Lab 09/04/20 0451 09/04/20 1948 09/05/20 0318  NA 140 140 141  K 2.8* 4.1 4.0  CL 99 102 100  CO2 27 24 26   GLUCOSE 132* 155* 130*  BUN 66* 66* 69*  CREATININE 2.43* 2.57* 2.65*  CALCIUM 9.7 9.9 9.6  GFRNONAA 20* 19* 18*  ANIONGAP 14 14 15      Hematology Recent Labs  Lab 08/31/20 0212 09/01/20 0159 09/02/20 0457  WBC 6.1 5.9 4.8  RBC 3.97 3.75* 3.86*  HGB 12.7 11.8* 12.1  HCT 40.1 38.2 38.6  MCV 101.0* 101.9* 100.0  MCH 32.0 31.5 31.3  MCHC 31.7 30.9 31.3  RDW 14.6 14.5 14.5  PLT 196 169 179    BNP  Recent Labs  Lab 08/31/20 0212  BNP >4,500.0*     DDimer No results for input(s): DDIMER in the last 168 hours.   Radiology    No results found.  Cardiac Studies   Echo 08/31/2020 1. Limited exam as patient unable to cooperate.  2. Left ventricular ejection fraction, by estimation, is <20%. The left  ventricle has severely decreased function. The left ventricle demonstrates  global hypokinesis. The left ventricular internal cavity size was severely  dilated. There is mild left  ventricular hypertrophy. Left ventricular diastolic function could not be  evaluated.  3. Right ventricular systolic function is moderately reduced. The right  ventricular size is moderately enlarged.  4. There is a Mitra-Clip present in the mitral position. Mild to moderate  mitral valve stenosis, MG 6 mmHg at HR 98 bpm which is stable from prior  echo and likely partly elevated due to significant MR. Moderate to severe  mitral valve  regurgitation.  Limited images, but MR appears moderate to severe.  5. The aortic valve was not well visualized. Aortic valve regurgitation  is not visualized. AV gradients were not assessed to evaluate for AS   Patient Profile     75 y.o. female with a hx of chronic combined systolic and diastolic heart failure (EF 20 to 25%), severe mitral regurgitation status post MitraClip 02/02/2019, dementia, GI bleeding,CKDseen for CHF.  Assessment & Plan    She has made progress with fluid loss, but still has signs of hypervolemia. Dementia associated with periods of agitation and severe distress, making her medical care a challenge. I do not think that a repeat echo will offer additional information or change our treatment plan. She has secondary MR, the severity of which will wax and wane depending on volume status. Suspect her underlying rhythm is sinus tachycardia. It is regular. I think we should try to see if we can switch to an effective oral medication, since clearly hospitalization is not helping her neuropsychological status. Will try torsemide 50 mg BID.     For questions or updates, please contact [place] Please consult [email or web address] for contact info under        Signed, Sanda Klein, MD  09/05/2020, 9:57 AM

## 2020-09-05 NOTE — Consult Note (Signed)
Hannah B Robertsis a 75 y.o.femalepresenting with BLE edema, AKI, right 4th toe redness. PMH is significant forchronic HFrEF (last Echo 01/24/20- EF 20-25%), HTN, Dementia, GI bleed s/p mesenteric artery coil embolization, anxiety, severe mitral regurgitation s/p MitraClip (02/02/19), symptomatic anemia, insomnia  Psych consult placed for dementia with hallucinations. Complete psych eval for insomnia, hallucinations, on-off delirium and mood.   Patient is 75 year old married female who presents from home with insomnia, anemia, anxiety and worsening agitation. Patient reports hallucinations started about 1.5 weeks ago and have continued to get worse since being in the hospital. Please note she is a poor historian "whatever they said I said it. And whatever they did I did it. I dont know what them folks be posting on facebook, and I dont give a slip what they say. "  Discussed treatment options to include iwatch and wait as hallucinations appear consistent with delirium. Also discussed with patient high vulnerability to develop acute delirium since hospitalization and infection as a precipitating factor. Patient does not appear a harm to self or others although she is experiencing some paranoia and delusions, and intermittent hallucinations. Will proceed with plan below.   Chart review indicates patient has had increasing difficulty sleeping at night despite multiple agents to include Trazodone, melatonin and buspar. There are reports of her wandering and becoming more forgetful, agitation and confusion.  Patient has a diagnosis of Dementia since 2016, fast score of 5. No recent nueropsych evaluation or neurology evaluation available in chart.   Patient has hx of Dementia? probable sundowning as well versus delirium from hospitalization. patient admitted with worsening cognitive impairment, wandering behaviors, and decrease in mental status.  [facility] and his 2 daughters.  Patient does admit to some decline in memory impairment however does not feel as though he needs to be in the hospital.  Patient does state that his family is concerned about him so he agreed to come to the hospital and be evaluated.  Patient denies any previous psychiatric diagnosis, suicide attempts, and or inpatient admission.  On evaluation he also denies current suicidal ideation, suicidal thoughts, and or suicidal gestures.  He is able to contract for safety.  He does provide consent to this writer to obtain collateral information from his wife and 2 daughters.  On evaluation patient is alert and oriented, calm and cooperative and actively participates in the assessment.  Patient does not show signs of memory impairment as evident by proper orientation, memory recall, and insight.  Discussed with patient current findings that are consistent with early stages of dementia, and he is agreeable for follow-up outside of the hospital.  Also reviewed patient medical evaluation, and appears he has been medically cleared by the emergency room physician to include a CT of the head and CT of the abdomen, routine labs, chest x-ray, and urine studies.  Only abnormal finding was elevated ammonia level, decreased hemoglobin, abnormal CT of abdomen.   Patient was observed sitting in a bed holding a cup of drink. SHe is observed talking to staff, and conversation appears to be appropriate. SHe is fairly groomed and appears to have been bathed. Her mood is euthymic and her affect is congruent and appropriate. Writer was able to gain her attention and she is easily redirected. Per chart review dhe continues to have restless at time, however is improved with hands on care, ambulation and talking to the patient. Patient may benefit from skilled nursing  rehab or assisted living facility.    Patient behaviors are consistent with a person with dementia and other memory loss. SNF, ALF,  Home Health unit would be appropriate as they are well equipped to help elderly persons with memory loss, maintain cognitive skills and help with quality of life. Patient is able to take care of self, however needs more social interaction and person centered care to help with worsening memory impairment and difficult dementia behaviors.  -Recommend outpatient neuropsych evaluation for further evaluation of dementia.  -Will order TSH (history of hypothyroidism), B12 (hx of low b12).  -Patient currently on antipsychotic Abilify (home medications) which can be used to manage her agitation, confusion, and mood stabilization.  -Prolonged Qtc -will repeat EKG. QTc obtained on 05/15 shows QTc 508. AT this time do not recommend any additional antipsychotics to mange her behaviors.  -Dementia with Behavioral disturbances: She is taking Depakote 125mg  po TID. Valproic acid level scheduled for tomorrow morning. Will continue at this time, if level is not at goal (75-100) may increase to further target symptoms of agitation, confusion, and intermittent hallucinations.  -Insomnia- continue rozerem as ordered. PAtient has previously tried and failed multiple agents. Patient with intermittent nighttime wakings are normal in a person with dementia. Mirtazapine and doxepin are not recommended due to QTc.  Hallucinations- Increase Abilify 7.5. Unable to recommend any additional agents at this time due to QTc.  -Chief Technology Officer. Safety sitters offer supportive interaction with patient, and can easily identify patient needs, reorient, and address environmental factors that contribute to the delirium.   - Delirium precautions - Minimize/avoid deliriogenic meds including: anticholinergic, opiates, benzodiazepines           - Maintain hydration, oxygenation, nutrition           - Limit use of restraints and catheters           - Normalize sleep patterns by minimizing nighttime noise, light and interruptions by  clustering care, opening blinds during the day           - Encourage ambulation, regular activities and visitors to maintain cognitive stimulation

## 2020-09-05 NOTE — Progress Notes (Signed)
Occupational Therapy Treatment Patient Details Name: Hannah [name] MRN: [id number] DOB: [birth year] Today's Date: 09/05/2020    History of present illness 75 y.o. female referred to [facility] by PCP after labs showed an AKI, and in the setting of a suspected CHF exacerbation. Pt  presents with BLE edema, AKI, and right 4th toe redness. Pt was diagnosed with acute on chronic CHF exacerbation, cellulitis of R 4th toe 5/11. PMH includes chronic HFrEF, HTN, dementia, GI bleed s/p mesenteric artery coil embolization, anxiety, severe mitral regurgitation s/p MitraClip 02/02/19, symtpomatic anemia, insomnia.   OT comments  Pt continues to make progress with functional goals. Pt seated on BSC upon arrival, sitter present. Pt continues to be confused, asking to use restroom although she is already on [facility]. Session focused on toileting tasks, hygiene standing at sink, functional mobility. OT will continue to follow acutely to maximize level of function and safety  Follow Up Recommendations  Home health OT;Supervision/Assistance - 24 hour    Equipment Recommendations  None recommended by OT    Recommendations for Other Services      Precautions / Restrictions Precautions Precautions: Fall       Mobility Bed Mobility               General bed mobility comments: pt on BSC upon arrival    Transfers Overall transfer level: Needs assistance Equipment used: Rolling walker (2 wheeled) Transfers: Sit to/from Stand Sit to Stand: Min guard Stand pivot transfers: Min guard            Balance Overall balance assessment: Needs assistance Sitting-balance support: Feet supported;No upper extremity supported;Single extremity supported Sitting balance-Leahy Scale: Good     Standing balance support: During functional activity;No upper extremity supported;Bilateral upper extremity supported;Single extremity supported Standing balance-Leahy Scale: Poor                             ADL  either performed or assessed with clinical judgement   ADL Overall ADL's : Needs assistance/impaired     Grooming: Wash/dry hands;Wash/dry face;Min guard;Standing               Lower Body Dressing: Min guard;Sit to/from stand   Toilet Transfer: Min guard;Ambulation;Comfort height toilet;Grab bars   Toileting- Clothing Manipulation and Hygiene: Sit to/from stand;Min guard       Functional mobility during ADLs: Min guard;Cueing for safety General ADL Comments: pt limited by impaired activity tolerance, generalized weakness, and impaired cognition     Vision Patient Visual Report: No change from baseline Vision Assessment?: No apparent visual deficits   Perception     Praxis      Cognition Arousal/Alertness: Awake/alert Behavior During Therapy: WFL for tasks assessed/performed Overall Cognitive Status: Impaired/Different from baseline Area of Impairment: Following commands;Safety/judgement                 Orientation Level: Situation;Disoriented to;Time;Place   Memory: Decreased short-term memory Following Commands: Follows one step commands consistently Safety/Judgement: Decreased awareness of deficits;Decreased awareness of safety   Problem Solving: Requires verbal cues General Comments: Hx of dementia. Pt following one step commands consistently. '        Exercises     Shoulder Instructions       General Comments      Pertinent Vitals/ Pain       Pain Assessment: Faces Faces Pain Scale: Hurts little more Pain Location: back Pain Descriptors / Indicators: Discomfort;Grimacing Pain Intervention(s): Monitored during session  Home Living                                          Prior Functioning/Environment              Frequency  Min 2X/week        Progress Toward Goals  OT Goals(current goals can now be found in the care plan section)  Progress towards OT goals: Progressing toward goals     Plan Discharge  plan remains appropriate    Co-evaluation                 AM-PAC OT "6 Clicks" Daily Activity     Outcome Measure   Help from another person eating meals?: None Help from another person taking care of personal grooming?: A Little Help from another person toileting, which includes using toliet, bedpan, or urinal?: A Little Help from another person bathing (including washing, rinsing, drying)?: A Little Help from another person to put on and taking off regular upper body clothing?: None Help from another person to put on and taking off regular lower body clothing?: A Little 6 Click Score: 20    End of Session Equipment Utilized During Treatment: Gait belt  OT Visit Diagnosis: Other abnormalities of gait and mobility (R26.89);Muscle weakness (generalized) (M62.81);Other symptoms and signs involving cognitive function   Activity Tolerance Patient tolerated treatment well   Patient Left with nursing/sitter in room;Other (comment) (on [facility])   Nurse Communication          Time: 3754-3606 OT Time Calculation (min): 19 min  Charges: OT General Charges $OT Visit: 1 Visit OT Treatments $Self Care/Home Management : 8-22 mins     Britt Bottom 09/05/2020, 2:37 PM

## 2020-09-06 ENCOUNTER — Inpatient Hospital Stay (HOSPITAL_COMMUNITY): Payer: Medicare HMO

## 2020-09-06 DIAGNOSIS — N179 Acute kidney failure, unspecified: Secondary | ICD-10-CM | POA: Diagnosis not present

## 2020-09-06 DIAGNOSIS — N189 Chronic kidney disease, unspecified: Secondary | ICD-10-CM | POA: Diagnosis not present

## 2020-09-06 DIAGNOSIS — Z515 Encounter for palliative care: Secondary | ICD-10-CM | POA: Diagnosis not present

## 2020-09-06 DIAGNOSIS — I340 Nonrheumatic mitral (valve) insufficiency: Secondary | ICD-10-CM

## 2020-09-06 DIAGNOSIS — I5043 Acute on chronic combined systolic (congestive) and diastolic (congestive) heart failure: Secondary | ICD-10-CM | POA: Diagnosis not present

## 2020-09-06 LAB — VALPROIC ACID LEVEL: Valproic Acid Lvl: 45 ug/mL — ABNORMAL LOW (ref 50.0–100.0)

## 2020-09-06 LAB — BASIC METABOLIC PANEL
Anion gap: 13 (ref 5–15)
BUN: 71 mg/dL — ABNORMAL HIGH (ref 8–23)
CO2: 28 mmol/L (ref 22–32)
Calcium: 9.2 mg/dL (ref 8.9–10.3)
Chloride: 99 mmol/L (ref 98–111)
Creatinine, Ser: 2.86 mg/dL — ABNORMAL HIGH (ref 0.44–1.00)
GFR, Estimated: 16 mL/min — ABNORMAL LOW (ref >60)
Glucose, Bld: 133 mg/dL — ABNORMAL HIGH (ref 70–99)
Potassium: 3.9 mmol/L (ref 3.5–5.1)
Sodium: 140 mmol/L (ref 135–145)

## 2020-09-06 LAB — HEPATIC FUNCTION PANEL
ALT: 29 U/L (ref 0–44)
AST: 36 U/L (ref 15–41)
Albumin: 3.3 g/dL — ABNORMAL LOW (ref 3.5–5.0)
Alkaline Phosphatase: 59 U/L (ref 38–126)
Bilirubin, Direct: 0.4 mg/dL — ABNORMAL HIGH (ref 0.0–0.2)
Indirect Bilirubin: 1 mg/dL — ABNORMAL HIGH (ref 0.3–0.9)
Total Bilirubin: 1.4 mg/dL — ABNORMAL HIGH (ref 0.3–1.2)
Total Protein: 6 g/dL — ABNORMAL LOW (ref 6.5–8.1)

## 2020-09-06 LAB — TSH: TSH: 6.265 u[IU]/mL — ABNORMAL HIGH (ref 0.350–4.500)

## 2020-09-06 LAB — T4, FREE: Free T4: 0.85 ng/dL (ref 0.61–1.12)

## 2020-09-06 LAB — VITAMIN B12: Vitamin B-12: 1349 pg/mL — ABNORMAL HIGH (ref 180–914)

## 2020-09-06 MED ORDER — SENNA 8.6 MG PO TABS
1.0000 | ORAL_TABLET | Freq: Every day | ORAL | Status: DC
  Administered 2020-09-06: 8.6 mg via ORAL
  Filled 2020-09-06: qty 1

## 2020-09-06 MED ORDER — TORSEMIDE 100 MG PO TABS: 50.0000 mg | ORAL_TABLET | Freq: Two times a day (BID) | ORAL | Status: DC

## 2020-09-06 MED ORDER — DIVALPROEX SODIUM 125 MG PO CSDR
250.0000 mg | DELAYED_RELEASE_CAPSULE | Freq: Two times a day (BID) | ORAL | Status: DC
  Administered 2020-09-06 – 2020-09-08 (×4): 250 mg via ORAL
  Filled 2020-09-06 (×5): qty 2

## 2020-09-06 MED ORDER — HYDROCODONE-ACETAMINOPHEN 5-325 MG PO TABS
1.0000 | ORAL_TABLET | ORAL | Status: DC | PRN
  Administered 2020-09-06 – 2020-09-10 (×7): 1 via ORAL
  Filled 2020-09-06 (×8): qty 1

## 2020-09-06 NOTE — Progress Notes (Signed)
Daily Progress Note   [name]       Date: 09/06/2020 DOB: 1945/09/15  Age: 75 y.o. MRN#: [id number] Attending Physician: Lucious Groves, DO Primary Care Physician: [name], MD Admit Date: 08/30/2020  Reason for Consultation/Follow-up: To discuss complex medical decision making related to patient's goals of care  Subjective: Spoke with patient at bedside.  She does not know why she was admitted to the hospital.  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  Then she tells me she has back pain and wants to lie down and sleep.  I spoke to patient's daughter Hannah [name] for approximately 45 min.  She filled me in on being her primary care taker for the past two years.  Her concern over her heart and kidneys.   Very clearly she wants the best care for her mother and does not want her to suffer.    Hannah [name] and her brother Hannah [name] share medical POA.  Hannah [name] has Durable POA.   Hannah [name] is very concerned about her mother's insomnia.  She can't possibly get better if she can't sleep.  She feels as though her mother has not received treatment for dementia - not dementia specific meds nor mood stabilizers.  We talked a bit about prolonging the QTC and needed to have a discussion about risks and benefits of medications that may prolong her QTC.  I talked with Hannah [name] about her mother's back pain and she was agreeable to increasing her pain medication to try to help her sleep.  We also talked about her mitral regurg contributing to worsening of there heart failure and the negative effect on the kidneys.  We also discussed the osteomyelitis in her toe and why this was so significant.  Towards the end of our conversation we made a  plan to teleconference with Hannah [name] and her brother Hannah [name] tomorrow to talk more in depth about her mother's conditions and options for care.   Assessment: Patient with advanced heart failure, progressive kidney failure and dementia.   Hospitalized with a heart failure exacerbation.   Patient Profile/HPI:  Per intake H&P --> Hannah [name] a 75 y.o.femalepresenting with BLE edema, AKI, right 4th toe redness. PMH is significant forchronic HFrEF (last Echo 01/24/20-  EF 20-25%), HTN, Dementia, GI bleed s/p mesenteric artery coil embolization, anxiety, severe mitral regurgitation s/p MitraClip (02/02/19), symptomatic anemia, insomnia.    Length of Stay: 5   Vital Signs: BP 101/63 (BP Location: Right Arm)   Pulse (!) 108   Temp 98 F (36.7 C) (Oral)   Resp 20   Ht 5\' 2"  (1.575 m)   Wt 75.6 kg   SpO2 100%   BMI 30.48 kg/m  SpO2: SpO2: 100 % O2 Device: O2 Device: Room Air O2 Flow Rate:         Palliative Assessment/Data: 40%     Palliative Care Plan    Recommendations/Plan: Family meeting scheduled for 9:00 on 5/21  Code Status:  Full code  Prognosis: Likely months.  Patient is eligible for hospice services in the home due to advanced mixed HF (EF<20%), progressive kidney disease in the setting of dementia.  Discharge Planning: To Be Determined.  Patient will most likely go home with services.  Care plan was discussed with daughter Hannah [name] and Dr. Owens Shark.  Thank you for allowing the Palliative Medicine Team to assist in the care of this patient.  Total time spent:  60 min     Greater than 50%  of this time was spent counseling and coordinating care related to the above assessment and plan.  Florentina Jenny, PA-C Palliative Medicine  Please contact Palliative MedicineTeam phone at [phone] for questions and concerns between 7 am - 7 pm.   Please see AMION for individual provider pager numbers.

## 2020-09-06 NOTE — Progress Notes (Addendum)
Progress Note  [name] Date of Encounter: 09/06/2020  Primary Cardiologist: Jenkins Rouge, MD   Subjective   Remains confused. States she is nauseated.   Inpatient Medications    Scheduled Meds: . acetaminophen  650 mg Oral Q6H  . ARIPiprazole  7.5 mg Oral Daily  . atorvastatin  80 mg Oral Daily  . cephALEXin  500 mg Oral Q8H  . diclofenac Sodium  2 g Topical QID  . divalproex  250 mg Oral Q12H  . enoxaparin (LOVENOX) injection  30 mg Subcutaneous Q24H  . lidocaine  2 patch Transdermal Q24H  . nystatin ointment   Topical BID  . pantoprazole  40 mg Oral Daily  . polyethylene glycol  17 g Oral Daily  . ramelteon  8 mg Oral QHS  . sodium chloride flush  3 mL Intravenous Q12H  . torsemide  50 mg Oral BID   Continuous Infusions: . sodium chloride     PRN Meds: sodium chloride, sodium chloride flush   Vital Signs    Vitals:   09/05/20 1959 09/06/20 0354 09/06/20 0400 09/06/20 0538  BP: 105/61 105/74  112/76  Pulse: 80 (!) 113  (!) 105  Resp: 20 20  20   Temp: 98.1 F (36.7 C) (!) 97.5 F (36.4 C)  98.2 F (36.8 C)  TempSrc: Oral Oral  Oral  SpO2: 94% 99%  99%  Weight:   75.6 kg   Height:        Intake/Output Summary (Last 24 hours) at 09/06/2020 0944 Last data filed at 09/06/2020 0800 Gross per 24 hour  Intake 390 ml  Output 800 ml  Net -410 ml   Filed Weights   09/04/20 0500 09/05/20 0134 09/06/20 0400  Weight: 76.4 kg 75.7 kg 75.6 kg    Physical Exam   General: Elderly, NAD Neck: Negative for carotid bruits. No JVD Lungs:Clear to ausculation bilaterally. Breathing is unlabored. Cardiovascular: RRR with S1 S2. No murmurs Abdomen: Soft, non-tender, non-distended. No obvious abdominal masses. Extremities: 2+ BLE edema. Radial pulses 2+ bilaterally Neuro: Disoriented. No focal deficits. No facial asymmetry. MAE spontaneously. Psych: Does not respond to questions appropriately with normal affect.    Labs     Chemistry Recent Labs  Lab 09/04/20 1948 09/05/20 0318 09/06/20 0601  NA 140 141 140  K 4.1 4.0 3.9  CL 102 100 99  CO2 24 26 28   GLUCOSE 155* 130* 133*  BUN 66* 69* 71*  CREATININE 2.57* 2.65* 2.86*  CALCIUM 9.9 9.6 9.2  GFRNONAA 19* 18* 16*  ANIONGAP 14 15 13      Hematology Recent Labs  Lab 08/31/20 0212 09/01/20 0159 09/02/20 0457  WBC 6.1 5.9 4.8  RBC 3.97 3.75* 3.86*  HGB 12.7 11.8* 12.1  HCT 40.1 38.2 38.6  MCV 101.0* 101.9* 100.0  MCH 32.0 31.5 31.3  MCHC 31.7 30.9 31.3  RDW 14.6 14.5 14.5  PLT 196 169 179    Cardiac EnzymesNo results for input(s): TROPONINI in the last 168 hours. No results for input(s): TROPIPOC in the last 168 hours.   BNP Recent Labs  Lab 08/31/20 0212  BNP >4,500.0*     DDimer No results for input(s): DDIMER in the last 168 hours.   Radiology    US RENAL  Result Date: 09/06/2020 CLINICAL DATA:  Acute renal insufficiency EXAM: RENAL / URINARY TRACT ULTRASOUND COMPLETE COMPARISON:  None. FINDINGS: Right Kidney: Renal measurements: 9.3 x 4.6 x 4.4 cm = volume: 98 mL. Echogenicity within  normal limits. No mass or hydronephrosis visualized. Left Kidney: Renal measurements: 9.3 x 4.6 x 3.6 cm = volume: 80 mL. Echogenicity within normal limits. No mass or hydronephrosis visualized. Bladder: Appears normal for degree of bladder distention. Other: Mild ascites noted within the pelvis. Small to moderate right pleural effusion noted. IMPRESSION: Normal renal sonogram. Moderate right pleural effusion and mild ascites noted. Electronically Signed   By: Fidela Salisbury MD   On: 09/06/2020 05:20   ECG    No new tracing as of 09/06/20- Personally Reviewed  Cardiac Studies   Echo 08/31/2020 1. Limited exam as patient unable to cooperate.  2. Left ventricular ejection fraction, by estimation, is <20%. The left  ventricle has severely decreased function. The left ventricle demonstrates  global hypokinesis. The left ventricular internal cavity  size was severely  dilated. There is mild left  ventricular hypertrophy. Left ventricular diastolic function could not be  evaluated.  3. Right ventricular systolic function is moderately reduced. The right  ventricular size is moderately enlarged.  4. There is a Mitra-Clip present in the mitral position. Mild to moderate  mitral valve stenosis, MG 6 mmHg at HR 98 bpm which is stable from prior  echo and likely partly elevated due to significant MR. Moderate to severe  mitral valve regurgitation.  Limited images, but MR appears moderate to severe.  5. The aortic valve was not well visualized. Aortic valve regurgitation  is not visualized. AV gradients were not assessed to evaluate for AS  Patient Profile     75 y.o. female with a hx of chronic combined systolic and diastolic heart failure (EF 20 to 25%), severe mitral regurgitation status post MitraClip 02/02/2019, dementia, GI bleeding,CKDseen for CHF.  Assessment & Plan    1.Acute on chronic combined systolic and diastolic heart failure: -Pt presented with acute volume overload with BNP>4500 -Echocardiogram 5/14withlimited images as patient was unable to cooperate with exam, but showed EF less than 20%, moderately reduced RV function, moderate to severe MR, mild to moderate MS. -Treated with IV Lasix that was transitioned to Torsemide yesterday  -Weight, 166lb today -I&O, net negative 2L -Continue Toprol XL once better volume status  -NoACE/ARB/ARNI, spironolactone, or SGLT2idue to CKD  2.AKI: -Creatinine, 2.86 today>>>up from 2.65 yesterday  -Baseline appears to be in the 1.2-1.3 range  3.Troponin elevation: -HsT, 122>>127 felt to be secondary to demand ischemia due to fluisd volume overload   4. Tachycardia: -HR remains in the low 100's per vitals  -EKG on admit Sinus tachy   Signed, Kathyrn Drown NP-C [place] Pager: [phone] 09/06/2020, 9:44 AM     For questions or updates, please contact    Please consult [email or web address] for contact info under Cardiology/STEMI.  I have seen and examined the patient along with Kathyrn Drown NP-C.  I have reviewed the chart, notes and new data.  I agree with PA/NP's note.  Key new complaints: nausea, anxiety Key examination changes: remains markedly edematous Key new findings / data: creatinine continues to worsen  PLAN: I am afraid that we may be reaching the limits of medical therapy. She is not a candidate for advanced heart failure therapies and use of inotropes is not therefore justified. I agree that she is not a candidate for dialysis. I agree with Dr. Joylene Grapes that palliative care should be strongly recommended.  With very limited options, would consider hospice care.  Sanda Klein, MD, [place] [phone] 09/06/2020, 11:32 AM

## 2020-09-06 NOTE — Progress Notes (Signed)
   09/06/20 0354  Assess: MEWS Score  Temp (!) 97.5 F (36.4 C)  BP 105/74  Pulse Rate (!) 113  Resp 20  Level of Consciousness Alert  SpO2 99 %  O2 Device Nasal Cannula  Assess: MEWS Score  MEWS Temp 0  MEWS Systolic 0  MEWS Pulse 2  MEWS RR 0  MEWS LOC 0  MEWS Score 2  MEWS Score Color Yellow  Assess: if the MEWS score is Yellow or Red  Were vital signs taken at a resting state? Yes  Focused Assessment No change from prior assessment  Early Detection of Sepsis Score *See Row Information* Medium  MEWS guidelines implemented *See Row Information* Yes  Take Vital Signs  Increase Vital Sign Frequency  Yellow: Q 2hr X 2 then Q 4hr X 2, if remains yellow, continue Q 4hrs  Escalate  MEWS: Escalate Yellow: discuss with charge nurse/RN and consider discussing with provider and RRT  Notify: Charge Nurse/RN  Name of Charge Nurse/RN Notified [place]  Date Charge Nurse/RN Notified 09/06/20  Time Charge Nurse/RN Notified 0400

## 2020-09-06 NOTE — Progress Notes (Signed)
Called and spoke with patient about updates.  We again discussed nephrology and cardiology recommendations.  Daughter had questions about prognosis and prior care last few months.  We also discussed the findings on foot imaging which demonstrate osteomyelitis.  We discussed options for osteomyelitis which include antibiotics, orthopedic consultation.  We both agreed that amputation would not be mom's best interest.  She would consider antibiotics.  She recognizes that these have long-term complications  In light of multiple progressive diseases, I recommended we discussed further treatment and goals of care with palliative medicine and the family given this patient's complex medical history.  The patient's daughter is agreeable to this. She would like to speak with Sharyn Lull from palliative. Discussed case with palliative and family, Florentina Jenny to work with family over weekend to address goals of care.   Further treatment of osteomyelitis pending palliative discussion, would consider starting oral therapy if family chooses antibiotics.   Greatly appreciate palliative medicine consultation care.   [name], MD  Family Medicine Teaching Service

## 2020-09-06 NOTE — Progress Notes (Signed)
Family Medicine Teaching Service Daily Progress Note Intern Pager: [phone]  [name] Medical record number: [id number] Date of birth: [birth year] Age: 75 y.o. Gender: female  Primary Care Provider: [name], MD Consultants:  Cards  Code Status: Full  Pt Overview and Major Events to Date:  5/14- Admitted  Assessment and Plan: Ivis Nicolson Robertsis a 75 y.o.femalewho presents with bilateral lower extremity edema secondary to HFrEF exacerbation. PMHx significant for HFrEF, severe MVR (s/p MitraClip 01/2019), dementia, GI bleed s/p mesenteric artery coil embolization, symptomatic anemia, insomnia.  HFrEF exacerbation Patient IV Furosemide held yesterday in setting of Hypokalemia.  Had urinary output of 900 yesterday.   Switched to Oral Torsemide.  Weight unchanged since yesterday.  Continue to diurese.   - Cards following, appreciate recs - Continue Oral Torsemide - Holdmetoprolol succinate 50 mg - strict I/O - daily weights - Daily updates for daughter  Hypokalemia K-3.9 this AM. - Daily BMP  Dementia with deliriumI Anxiety Patient wascalm and cooperativethis morning during evaluation. Has been redirectable most of the timebut has been hostile to the [employer] and nurses.Palliative spoke with Daughteron 5/18and daughter indicated she does not wish to change GoC at this time.Patient had sitter overnight and this morning.Full Diet.1:1 sitter currently in place.  Depakote level subtherapeutic. - palliative care following, appreciate involvement with [place] discussion - continue 1:1 sitter, redirect if possible - Increase Depakote250 mg BID - continue aripriprazole 5 mg daily -holdhome Buspar -c/wramelteon 8 mg qhs - avoid antipsychotics and/or benzodiazepines if possible given prolonged QTc (QTc 508 this morning) and age - Daily updates for daughter - delirium precautions - Psych to see today  Cellulitis of right 4th  toe Finishes Antibiotic course today.  Still some redness and pustulence on toe. -Remains afebrile and without leukocytosis.  - Obtain X-Ray - cephalexin 500 mg q8h (5/14-5/20) - consider colchicine if no improvement with antibiotics (though would be careful givenCKD)  Chronic Back Pain Patient intermittently complains about. Able to be distracted from and no issues with ambulation. - Scheduled Tylenol 650 mg q6hr per Pharmacy recs - Consider Oxycodone 2.5mg  for severe breakthrough pain  Tick bite Received 1 dose oral doxycycline5/16. - No further treatment necessary  AKI vs. CKD Likely progression of CKD given stable Creatinine.  Cr increased today to 2.85.  Renal Ultrasound normal. - DailyBMP - Continue Torsemide - See diuresis per above   Mitral valve regurgitation Patient is s/p MitraClip 02/03/2019. TTE this admission revealing moderate to severe mitral valve regurgitation, MitraClip is in place. Underlying valve disorder possibly contributing to CHF.  Insomnia Likely related to dementia. Trazodone discontinued due to concerns of hallucinations. - ramelteon as above  GERD - continue PPI  HTN Normotensive to soft. -Holding home Metoprolol  HLD - continue home statin  Incidental pulmonary nodule Seen on CTA 09/2019.  - repeat outpatient June 2022  Candidal Intertrigo (resolved) Noted on admission exam under breasts and in groin.  Resolved as of yesterday. - D/C nystatin ointment  Elevated TSH TSH-6.265 - Obtain T3 & T4   FEN/GI: Heart Healthy Diet PPx: Lovenox   Status is: Inpatient  Remains inpatient appropriate because:Hemodynamically unstable and Persistent severe electrolyte disturbances   Dispo: The patient is from: Home              Anticipated d/c is to: Home              Patient currently is not medically stable to d/c.   Difficult to place patient No  Subjective:  Patient up in bed eating.  Complaining of back  pain.  Objective: Temp:  [97.5 F (36.4 C)-98.2 F (36.8 C)] 98.2 F (36.8 C) (05/20 0538) Pulse Rate:  [80-113] 105 (05/20 0538) Resp:  [20] 20 (05/20 0538) BP: (105-112)/(61-76) 112/76 (05/20 0538) SpO2:  [94 %-99 %] 99 % (05/20 0538) Weight:  [75.6 kg] 75.6 kg (05/20 0400) Physical Exam:  Physical Exam Constitutional:      Appearance: Normal appearance.  HENT:     Head: Normocephalic and atraumatic.     Mouth/Throat:     Mouth: Mucous membranes are moist.  Cardiovascular:     Rate and Rhythm: Normal rate and regular rhythm.  Pulmonary:     Effort: Pulmonary effort is normal.     Breath sounds: Normal breath sounds.  Skin:    General: Skin is warm.     Findings: No lesion.  Neurological:     General: No focal deficit present.     Mental Status: She is alert and oriented to person, place, and time.     Laboratory: Recent Labs  Lab 08/31/20 0212 09/01/20 0159 09/02/20 0457  WBC 6.1 5.9 4.8  HGB 12.7 11.8* 12.1  HCT 40.1 38.2 38.6  PLT 196 169 179   Recent Labs  Lab 09/04/20 1948 09/05/20 0318 09/06/20 0601  NA 140 141 140  K 4.1 4.0 3.9  CL 102 100 99  CO2 24 26 28   BUN 66* 69* 71*  CREATININE 2.57* 2.65* 2.86*  CALCIUM 9.9 9.6 9.2  GLUCOSE 155* 130* 133*   VA level- 45 TSH- 6.265  Imaging/Diagnostic Tests:  Renal Ultrasound IMPRESSION: Normal renal sonogram.   Delora Fuel, MD 09/06/2020, 7:56 AM PGY-1, [place] Intern pager: [phone], text pages welcome

## 2020-09-06 NOTE — Consult Note (Signed)
Patient seen and assessed yesterday. Ordered appropriate labs and repeat EKG. Follow up on labs shows them to be abnormal to include Valproic acid level (45) and TSH 6.6. EKG obtained yesterday shows minimal improvement in QTc (482).  Vitamin B12 is greatly elevated (1,349),  which can also contribute to her worsening neuropsychiatric presentation (depression and delirium).   At this time will increase her Depakote 250mg  po BID to further target symptoms of dementia with behavioral disturbances.  -Continue Abilify 7.5mg  po qhs for agitation, hallucinations. As previously noted do not recommend initiation of any additional antipsychotics at this time as patient has extensive cardiac history and risk for worsening prolonged QTc. Patient behaviors are consistent with a person with dementia, and likely acute delirium in setting of infection and prolonged hospitalization stay (day 6).  -Continue to recommend safety sitter, and limit use of restraints in this patient.  -Recommend appropriate management of hypothyroidism as it can also contribute to her mental slowness, irritability, emotional lability, fatigue.  -Psychiatry to continue to follow, will reassess patient next week if she remains in the hospital.

## 2020-09-06 NOTE — Consult Note (Signed)
Nephrology Consult   Requesting provider: [name], MD Service requesting consult: Family Medicine Reason for consult: AKI on CKD   Assessment/Recommendations: Hannah [name] is a/an 75 y.o. female with a past medical history HFrEF, GI bleed, HTN, dementia, anxiety who present w/ heart failure exacerbation complicated by AKI  Nonoliguric AKI on CKD 3B: Baseline creatinine fluctuates but typically around 1.3-1.5.  Now with acute kidney injury.  Creatinine 2.5 on admission and 2.9 today.  Severe heart failure and the reason for her AKI is almost certainly cardiorenal syndrome with likely low cardiac output.  She does have lower extremity edema but her lungs are clear today. -Would hold diuretics for today and monitor status closely; hard to determine course without RHC -Goals of care and disposition as below -Continue to monitor daily Cr, Dose meds for GFR -Monitor Daily I/Os, Daily weight  -Maintain MAP>65 for optimal renal perfusion.  -Avoid nephrotoxic medications including NSAIDs and Vanc/Zosyn combo -Renal ultrasound and urinalysis without significant concern -Not a candidate for dialysis at this time as below  Volume Status/heart failure exacerbation: Severe heart failure follows with cardiology outpatient.  Her EF is less than 20%.  She likely is getting to a point of end-stage heart failure at this time.  Previous cardiology notes have indicated she is not a candidate for advanced therapies.  Strongly recommend involving palliative care.  Confusion/dementia: Worsening confusion for 1.5 months now with significant delirium.  Psychiatry following.  This is 1 huge barrier to outpatient dialysis.  In her current mental state she is not a dialysis candidate  Goals of care: I discussed the patient's severe clinical status with her daughter today.  The patient has severe likely end-stage heart failure with multisystem organ failure including neurological failure and kidney failure.  She  is not a dialysis candidate at this time because of her severe heart failure and because she is not a candidate for advanced therapies this is unlikely to improve.  Her mental status also precludes her from being a dialysis candidate.  We will conservative care but I strongly recommend more extensive palliative care involvement and discussions with the daughter.   Recommendations conveyed to primary service.    [facility] 09/06/2020 11:05 AM   _____________________________________________________________________________________ CC: AKI on CKD  History of Present Illness: Hannah [name] is a/an 75 y.o. female with a past medical history of HFrEF, GI bleed, HTN, dementia, anxiety who presents with CHF exacerbation.  The patient was severely confused with history was obtained per chart review and discussions with the daughter.  The daughter states that the patient has had significant insomnia over the past month and a half.  Before then she was much more interactive.  She has had problems with heart failure in the outpatient setting for a long time.  She has undergone several procedures including mitral clip in 2020.  It was noted by her heart failure team on admission that she is not a candidate for advanced therapies at this time.  Worsening confusion she came to the hospital and was admitted on 5/13.  She was noted to be significantly volume overloaded with undetectably high BNP.  Echocardiogram was obtained but limited by the patient's agitation and EF was found to be less than 20%.  She had global hypokinesis.  She has been undergoing diuresis but is only been documented to be -1.8 L since admission.  Escalating doses of diuretics have been used with difficulties achieving net negative status.  She most  recently is on torsemide.  Psychiatry and palliative care have been involved during this admission  When I interviewed the patient today she was unable to  answer questions consistently mostly saying she does not know the answer.  She did say that she felt very sick and like she was nauseated.  Medications:  Current Facility-Administered Medications  Medication Dose Route Frequency Provider Last Rate Last Admin  . 0.9 %  sodium chloride infusion  250 mL Intravenous PRN Meccariello, Bernita Raisin, DO      . acetaminophen (TYLENOL) tablet 650 mg  650 mg Oral Q6H Paige, Weldon Picking, DO   650 mg at 09/06/20 0951  . ARIPiprazole (ABILIFY) tablet 7.5 mg  7.5 mg Oral Daily Suella Broad, FNP   7.5 mg at 09/06/20 0951  . atorvastatin (LIPITOR) tablet 80 mg  80 mg Oral Daily Meccariello, Bernita Raisin, DO   80 mg at 09/06/20 0951  . cephALEXin (KEFLEX) capsule 500 mg  500 mg Oral Q8H Maness, Philip, MD   500 mg at 09/06/20 2800  . diclofenac Sodium (VOLTAREN) 1 % topical gel 2 g  2 g Topical QID Paige, Victoria J, DO   2 g at 09/06/20 3491  . divalproex (DEPAKOTE SPRINKLE) capsule 250 mg  250 mg Oral Q12H Suella Broad, FNP   250 mg at 09/06/20 0951  . enoxaparin (LOVENOX) injection 30 mg  30 mg Subcutaneous Q24H Meccariello, Bailey J, DO   30 mg at 09/05/20 1351  . lidocaine (LIDODERM) 5 % 2 patch  2 patch Transdermal Q24H Rosezella Rumpf, NP   2 patch at 09/05/20 1352  . pantoprazole (PROTONIX) EC tablet 40 mg  40 mg Oral Daily Meccariello, Bernita Raisin, DO   40 mg at 09/06/20 0951  . polyethylene glycol (MIRALAX / GLYCOLAX) packet 17 g  17 g Oral Daily Meccariello, Bailey J, DO   17 g at 09/04/20 0817  . ramelteon (ROZEREM) tablet 8 mg  8 mg Oral QHS Rosezella Rumpf, NP   8 mg at 09/05/20 2350  . sodium chloride flush (NS) 0.9 % injection 3 mL  3 mL Intravenous Q12H Meccariello, Bailey J, DO   3 mL at 09/06/20 0936  . sodium chloride flush (NS) 0.9 % injection 3 mL  3 mL Intravenous PRN Meccariello, Bernita Raisin, DO      . [START ON 09/08/2020] torsemide (DEMADEX) tablet 50 mg  50 mg Oral BID Reesa Chew, MD         ALLERGIES Trazodone  hcl and Plavix [clopidogrel]  MEDICAL HISTORY Past Medical History:  Diagnosis Date  . Anemia   . Anemia of chronic disease   . Anxiety   . Arthritis    "hands" (05/09/2015)  . Chronic diastolic heart failure ([place])   . CKD (chronic kidney disease) stage 3, GFR 30-59 ml/min (HCC)   . Dementia ([place])   . Elevated TSH 02/03/2018  . GERD (gastroesophageal reflux disease)   . HLD (hyperlipidemia)   . Hypertension   . LBBB (left bundle branch block)   . Mitral valve insufficiency   . Nonrheumatic mitral valve regurgitation 02/02/2019  . Osteoarthritis   . 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 S/P mitral valve repair    s/p MitraClip on 02/03/19  . Vitamin B12 deficiency 12/28/2018     SOCIAL HISTORY Social History   Socioeconomic History  . Marital status: Widowed    Spouse name: Chrissie Noa  . Number of children: 4  . Years of education: 30  .  Highest education level: High school graduate  Occupational History  . Occupation: Retired     Comment: Press photographer  Tobacco Use  . Smoking status: Former Smoker    Packs/day: 0.30    Years: 8.00    Pack years: 2.40    Types: Cigarettes    Quit date: 05/30/2004    Years since quitting: 16.2  . Smokeless tobacco: Never Used  Vaping Use  . Vaping Use: Never used  Substance and Sexual Activity  . Alcohol use: No    Alcohol/week: 0.0 standard drinks  . Drug use: Never  . Sexual activity: Not Currently  Other Topics Concern  . Not on file  Social History Narrative   Emergency Contact: [name] [phone]   End of Life Plan: gave pt ad pamplet   Any pets: 1 Dog    Diet: pt has a varied diet, low consumption of carbs and fatty foods    Exercise: pt does not have a regular exercise routine    Seatbelts: pt wears seat regularly in car   Hobbies: spending time with grandchildren      *Updated 09/26/2018*   Patients daughter [name] lives with her and assists her in driving to doctors apts and errands. Patient does have a renewed drivers licenses,  but feels more comfortable riding with her daughter. Patient enjoys going out to eat and spending time with her grandchildren who live in [place]. Patient stated they come during the summer to see her. Patient is excited her son, Vicente Males, is about to finish school to become a Theme park manager.    The patient has great family support system.    Social Determinants of Health   Financial Resource Strain: Not on file  Food Insecurity: Not on file  Transportation Needs: Not on file  Physical Activity: Not on file  Stress: Not on file  Social Connections: Not on file  Intimate Partner Violence: Not on file     FAMILY HISTORY Family History  Problem Relation Age of Onset  . Heart disease Mother        died at age 24  . Breast cancer Sister   . Cancer Sister        Breast  . Hypertension Father   . Hypertension Sister       Review of Systems: 12 systems reviewed; limited by mental status as above Otherwise as per HPI, all other systems reviewed and negative  Physical Exam: Vitals:   09/06/20 0354 09/06/20 0538  BP: 105/74 112/76  Pulse: (!) 113 (!) 105  Resp: 20 20  Temp: (!) 97.5 F (36.4 C) 98.2 F (36.8 C)  SpO2: 99% 99%   Total I/O In: -  Out: 200 [Urine:200]  Intake/Output Summary (Last 24 hours) at 09/06/2020 1105 Last data filed at 09/06/2020 0800 Gross per 24 hour  Intake 390 ml  Output 800 ml  Net -410 ml   General: Ill-appearing, lying in bed, agitated HEENT: anicteric sclera, oropharynx clear without lesions CV: Tachycardic, distant heart sounds, 2+ pitting edema in the bilateral lower extremities Lungs: clear to auscultation bilaterally, normal work of breathing Abd: soft, non-tender, non-distended Skin: no visible lesions or rashes Psych: Awake, alert, agitated Musculoskeletal: no obvious deformities Neuro: normal speech, delirious and not oriented to place or time, otherwise no gross focal deficits   Test Results Reviewed Lab Results  Component Value Date    NA 140 09/06/2020   K 3.9 09/06/2020   CL 99 09/06/2020   CO2 28 09/06/2020   BUN 71 (  H) 09/06/2020   CREATININE 2.86 (H) 09/06/2020   GLU 110 03/09/2019   CALCIUM 9.2 09/06/2020   ALBUMIN 4.4 04/30/2020   PHOS 4.4 02/28/2019     I have reviewed all relevant outside healthcare records related to the patient's current hospitalization

## 2020-09-06 NOTE — Progress Notes (Addendum)
Heart Failure Stewardship Pharmacist Progress Note   PCP: [name], MD PCP-Cardiologist: Jenkins Rouge, MD    HPI:  75 yo F with PMH of CHF, severe MR s/p MitraClip in 2020, dementia, GI bleeding, and CKD. She was admitted on 08/30/20 for LE edema and CHF exacerbation. A CXR was done on 08/31/20 and found to have mild CHF. An ECHO was also done on 08/31/20 and LVEF was <20% (20-25% in 01/2020, 30-35% in 02/2019, and 60-65% in 10/2018).   Current HF Medications: Torsemide 50 mg BID  Prior to admission HF Medications: Furosemide 80 mg daily Metoprolol XL 50 mg daily  Pertinent Lab Values: . Serum creatinine 2.43>2.65>2.86, BUN 71, Potassium 3.9, Sodium 140, Magnesium 1.9, BNP >4500  Vital Signs: . Weight: 166 lbs (admission weight: 172 lbs) . Blood pressure: 110/70s . Heart rate: 100s . I/O: neg 650 mL yesterday, net neg 2L since admission   Medication Assistance / Insurance Benefits Check: Does the patient have prescription insurance?  Yes Type of insurance plan: [employer] Medicare  Does the patient qualify for medication assistance through manufacturers or grants?   Pending . Eligible grants and/or patient assistance programs: pending . Medication assistance applications in progress: none  . Medication assistance applications approved: none Approved medication assistance renewals will be completed by: pending  Outpatient Pharmacy:  Prior to admission outpatient pharmacy: [facility] Is the patient willing to use [facility] at discharge? Yes Is the patient willing to transition their outpatient pharmacy to utilize a [facility] outpatient pharmacy?   Pending    Assessment: 1. Acute on chronic systolic CHF (EF <10%), due to presumed NICM. NYHA class III symptoms. Also with AKI - SCr up to 2.9 (was ~1.5 prior to this admission). - Agree with transitioning to torsemide 50 mg BID - Consider resuming PTA metoprolol XL  - AKI limits ACE/ARB/ARNI, spironolactone, or  SGLT2i at this time   Plan: 1) Medication changes recommended at this time: - None pending goals of care discussion with palliative  2) Patient assistance: - Entresto copay $45 per month - Jardiance copay $45 per month - Farxiga copay $95 per month  3)  Education  - To be completed prior to discharge  Kerby Nora, PharmD, BCPS Heart Failure Stewardship Pharmacist Phone [phone]

## 2020-09-06 NOTE — Progress Notes (Signed)
PT Cancellation Note  Patient Details Name: Hannah [name] MRN: [id number] DOB: October 14, 1945   Cancelled Treatment:    Reason Eval/Treat Not Completed: Patient declined, no reason specified.  Attempted x2.  Pt is confused with labile behaviors and will not do anything that caregivers ask of her today. 09/06/2020  Ginger Carne., PT Acute Rehabilitation Services [phone]  (pager) [phone]  (office)   Tessie Fass Shawny Borkowski 09/06/2020, 4:30 PM

## 2020-09-07 DIAGNOSIS — I5043 Acute on chronic combined systolic (congestive) and diastolic (congestive) heart failure: Secondary | ICD-10-CM | POA: Diagnosis not present

## 2020-09-07 DIAGNOSIS — I340 Nonrheumatic mitral (valve) insufficiency: Secondary | ICD-10-CM | POA: Diagnosis not present

## 2020-09-07 DIAGNOSIS — Z515 Encounter for palliative care: Secondary | ICD-10-CM | POA: Diagnosis not present

## 2020-09-07 DIAGNOSIS — Z66 Do not resuscitate: Secondary | ICD-10-CM | POA: Diagnosis not present

## 2020-09-07 LAB — BASIC METABOLIC PANEL
Anion gap: 16 — ABNORMAL HIGH (ref 5–15)
BUN: 73 mg/dL — ABNORMAL HIGH (ref 8–23)
CO2: 26 mmol/L (ref 22–32)
Calcium: 9.1 mg/dL (ref 8.9–10.3)
Chloride: 98 mmol/L (ref 98–111)
Creatinine, Ser: 3 mg/dL — ABNORMAL HIGH (ref 0.44–1.00)
GFR, Estimated: 16 mL/min — ABNORMAL LOW (ref >60)
Glucose, Bld: 121 mg/dL — ABNORMAL HIGH (ref 70–99)
Potassium: 3.5 mmol/L (ref 3.5–5.1)
Sodium: 140 mmol/L (ref 135–145)

## 2020-09-07 LAB — CBC
HCT: 39.7 % (ref 36.0–46.0)
Hemoglobin: 12.6 g/dL (ref 12.0–15.0)
MCH: 31.7 pg (ref 26.0–34.0)
MCHC: 31.7 g/dL (ref 30.0–36.0)
MCV: 99.7 fL (ref 80.0–100.0)
Platelets: 139 K/uL — ABNORMAL LOW (ref 150–400)
RBC: 3.98 MIL/uL (ref 3.87–5.11)
RDW: 14.8 % (ref 11.5–15.5)
WBC: 4.7 K/uL (ref 4.0–10.5)
nRBC: 0 % (ref 0.0–0.2)

## 2020-09-07 MED ORDER — TORSEMIDE 100 MG PO TABS
50.0000 mg | ORAL_TABLET | Freq: Two times a day (BID) | ORAL | Status: DC
  Administered 2020-09-07 – 2020-09-10 (×7): 50 mg via ORAL
  Filled 2020-09-07 (×8): qty 0.5

## 2020-09-07 MED ORDER — OLANZAPINE 5 MG PO TABS: 2.5000 mg | ORAL_TABLET | Freq: Every day | ORAL | Status: DC

## 2020-09-07 MED ORDER — OLANZAPINE 5 MG PO TABS
5.0000 mg | ORAL_TABLET | Freq: Every day | ORAL | Status: DC
  Administered 2020-09-07 – 2020-09-10 (×4): 5 mg via ORAL
  Filled 2020-09-07 (×4): qty 1

## 2020-09-07 MED ORDER — CEPHALEXIN 250 MG PO CAPS
500.0000 mg | ORAL_CAPSULE | Freq: Three times a day (TID) | ORAL | Status: DC
  Administered 2020-09-07 – 2020-09-08 (×3): 500 mg via ORAL
  Filled 2020-09-07 (×3): qty 2

## 2020-09-07 MED ORDER — SACCHAROMYCES BOULARDII 250 MG PO CAPS
250.0000 mg | ORAL_CAPSULE | Freq: Two times a day (BID) | ORAL | Status: DC
  Administered 2020-09-07 – 2020-09-11 (×8): 250 mg via ORAL
  Filled 2020-09-07 (×9): qty 1

## 2020-09-07 MED ORDER — OLANZAPINE 5 MG PO TABS
2.5000 mg | ORAL_TABLET | Freq: Every day | ORAL | Status: DC | PRN
  Administered 2020-09-07: 2.5 mg via ORAL
  Filled 2020-09-07: qty 1

## 2020-09-07 MED ORDER — TORSEMIDE 100 MG PO TABS
50.0000 mg | ORAL_TABLET | Freq: Two times a day (BID) | ORAL | Status: DC
  Administered 2020-09-07: 50 mg via ORAL
  Filled 2020-09-07: qty 0.5

## 2020-09-07 MED ORDER — SENNA 8.6 MG PO TABS
2.0000 | ORAL_TABLET | Freq: Every day | ORAL | Status: DC
  Administered 2020-09-09 – 2020-09-10 (×3): 17.2 mg via ORAL
  Filled 2020-09-07 (×4): qty 2

## 2020-09-07 MED ORDER — DOXYCYCLINE HYCLATE 100 MG PO TABS
100.0000 mg | ORAL_TABLET | Freq: Two times a day (BID) | ORAL | Status: DC
  Administered 2020-09-07 – 2020-09-11 (×9): 100 mg via ORAL
  Filled 2020-09-07 (×9): qty 1

## 2020-09-07 NOTE — Progress Notes (Signed)
Daily Progress Note   [name]       Date: 09/07/2020 DOB: Mar 17, 1946  Age: 75 y.o. MRN#: [id number] Attending Physician: Lucious Groves, DO Primary Care Physician: [name], MD Admit Date: 08/30/2020  Reason for Consultation/Follow-up: To discuss complex medical decision making related to patient's goals of care  Subjective: I visited the patient this morning.  She is awake and alert eating french toast.  She tells me she has a pounding HA and that she did not sleep well at all last night.  I spoke with [name] and Montine Circle (co-HCPOA) from 9:00 til 10:25 am.   We discussed Mrs. Hannah [name] heart failure, mitral regurgitation, kidney failure, osteomyelitis, dementia and goals of care.  With sadness Montine Circle and [name] both understand that their mother's prognosis is short (month to possibly months).  They understand that she is not a surgical candidate or a candidate for invasive procedures.  They are in agreement now that the best coarse of action is to focus on her quality of life an happiness.  We discussed code status.  Montine Circle and [name] are in agreement with DNR.  We discussed Hospice services and the Hospice philosophy.  Montine Circle and [name] have both had experiences with [facility] which were very positive.  They are in agreement with Hospice philosophy and request Hospice services to be started in the home on discharge.  I thoroughly enjoyed talking with [name] and Montine Circle and commended them on their care of and devotion to their mother.  Assessment: Very pleasantly demented female with rising creatinine (3.0 today), severe mixed heart failure with valvular dysfunction, and dementia.   Patient Profile/HPI:  Per intake H&P -->Hannah [name]  Robertsis a 75 y.o.femalepresenting with BLE edema, AKI, right 4th toe redness. PMH is significant forchronic HFrEF (last Echo 01/24/20- EF 20-25%), HTN, Dementia, GI bleed s/p mesenteric artery coil embolization, anxiety, severe mitral regurgitation s/p MitraClip (02/02/19), symptomatic anemia, insomnia.  Length of Stay: 6   Vital Signs: BP 105/68 (BP Location: Left Arm)   Pulse 100   Temp 97.9 F (36.6 C) (Oral)   Resp 19   Ht 5\' 2"  (1.575 m)   Wt 75.3 kg   SpO2 95%   BMI 30.36 kg/m  SpO2: SpO2: 95 % O2 Device: O2 Device: Room Air O2  Flow Rate:         Palliative Assessment/Data: 40%     Palliative Care Plan    Recommendations/Plan: Recommend long term oral antibiotics for osteomyelitis in 4 right toe. Recommend probiotic be started with antibiotics. Will continue with Hydrocodone 5 mg PRN pain, SOB along with 2 senna tablets each night. Will DC abilify and start very low dose Olanzapine.  Family understands the risk of QTC prolongation - but is concerned more with improving her quality of life.  If the Olanzapine does not increase QTC terribly would increase the dose to 5 mg tomorrow night and recheck EKG on Sunday morning. TOC order placed for Hospice services in the home on discharge.   Would like for [facility] to touch base with [name] today to start preparations. Hospice will need to provide equipment in the home and wound care to the right foot. Code status changed to DNR.  Code Status:  DNR  Prognosis:  < 6 months given advanced mixed heart failure with EF < 20% and valvular dysfunction as well as worsening stage 4 renal failure - not a hemodialysis candidate.   Hospice diagnosis is Heart failure.  Discharge Planning: Home with Hospice perhaps Monday if arrangements can be prepared by then  Care plan was discussed with family and medical team.  Thank you for allowing the Palliative Medicine Team to assist in the care of this patient.  Total time spent:  105  min Time in 8:45 Time out: 10:30     Greater than 50%  of this time was spent counseling and coordinating care related to the above assessment and plan.  Florentina Jenny, PA-C Palliative Medicine  Please contact Palliative MedicineTeam phone at [phone] for questions and concerns between 7 am - 7 pm.   Please see AMION for individual provider pager numbers.

## 2020-09-07 NOTE — Progress Notes (Addendum)
Family Medicine Teaching Service Daily Progress Note Intern Pager: [phone]  [name] Medical record number: [id number] Date of birth: 09/03/1945 Age: 75 y.o. Gender: female  Primary Care Provider: [name], MD Consultants:  Cards, Nephrology  Code Status: Full  Pt Overview and Major Events to Date:  5/14- Admitted  Assessment and Plan: Hannah Maiolo Robertsis a 75 y.o.femalewho presents with bilateral lower extremity edema secondary to HFrEF exacerbation. PMHx significant for HFrEF, severe MVR (s/p MitraClip 01/2019), dementia, GI bleed s/p mesenteric artery coil embolization, symptomatic anemia, insomnia.  HFrEF exacerbation Volume removal continues to be a challenge due to her ongoing cardiorenal syndrome.  I evaluated the patient with nephrology at bedside and they agreed it is reasonable to resume torsemide today. - Cards following, appreciate recs - Continue Oral Torsemide - Holdmetoprolol succinate 50 mg in the setting of severely reduced heart failure - strict I/O - daily weights - Daily updates for daughter  Hypokalemia-resolved - Daily BMP  Dementia  Hyperactive delirium Anxiety Continues to require one-to-one sitter.  Appears pleasantly demented however there are are times where there have been reports of patient being hostile to sitters - palliative care following, appreciate involvement with [facility] discussion - continue 1:1 sitter, redirect if possible -Continue Depakote250 mg BID - continue aripriprazole 5 mg daily -holdhome Buspar -c/wramelteon 8 mg qhs - avoid antipsychotics and/or benzodiazepines if possible given prolonged QTc  - Daily updates for daughter - delirium precautions - Appreciate psychiatry recommendations  Cellulitis of right 4th toe Osteomyelitis of right 4th toe This was discovered on x-ray yesterday which showed extensive soft tissue swelling and findings consistent with osteomyelitis of the fourth  middle and distal phalanx.  She has completed a course of Keflex for cellulitis.  There is ongoing discussion with palliative care regarding goals of care and long-term management of her acute medical issues.  In regards to her osteomyelitis, there has been discussions including antibiotics and orthopedic consultation for amputation which the daughter said would not be in the mother's best interest.  Could consider long-term oral antibiotics but will wait for palliative's discussion with patient's daughter.  It is reassuring that she continues to remain afebrile and without leukocytosis - Continue goals of care discussion with daughter regarding long-term plan - cephalexin 500 mg q8h (5/14-5/20) - consider colchicine if no improvement with antibiotics (though would be careful givenCKD)  Chronic Back Pain - Scheduled Tylenol 650 mg q6hr per Pharmacy recs - Consider Oxycodone 2.5mg  for severe breakthrough pain  Tick bite Received 1 dose oral doxycycline5/16. - No further treatment necessary  AoCKD IIIb Serum creatinine today is 3 from 2.86 yesterday.  Her baseline creatinine is about 1.2.  This is likely in the setting of poor renal perfusion from her severely reduced EF. - DailyBMP - Continue Torsemide - See diuresis per above -Appreciate nephrology recommendations  Mitral valve regurgitation Patient is s/p MitraClip 02/03/2019. TTE this admission revealing moderate to severe mitral valve regurgitation, MitraClip is in place. Underlying valve disorder possibly contributing to CHF.  Insomnia Likely related to dementia. Trazodone discontinued due to concerns of hallucinations. - ramelteon as above  GERD - continue PPI  HTN -Holding home Metoprolol  HLD - continue home statin  Incidental pulmonary nodule Seen on CTA 09/2019.  - repeat outpatient June 2022  Candidal Intertrigo (resolved) Noted on admission exam under breasts and in groin.  Resolved as of  yesterday. - D/C nystatin ointment  Subclinical hypothyroidism TSH-6.265.  Free T4 unremarkable at 0.8.  Can consider repeat TSH in about 6 weeks   FEN/GI: Heart Healthy Diet PPx: Lovenox2 Status is: Inpatient  Remains inpatient appropriate because:Hemodynamically unstable and Persistent severe electrolyte disturbances   Dispo: The patient is from: Home              Anticipated d/c is to: Home              Patient currently is not medically stable to d/c.   Difficult to place patient No   Subjective:  Hannah [name] was evaluated with sitter at bedside.  She appears quite pleasant and denies shortness of breath, chest pain, abdominal pain, nausea, fevers, chills.  She would randomly make statements such as "help me, you are scaring me, I am scared something is wrong with me.  "The sitter admits that she has been persistently confused  Objective: Temp:  [97.4 F (36.3 C)-98 F (36.7 C)] 97.9 F (36.6 C) (05/21 0540) Pulse Rate:  [100-108] 100 (05/21 0540) Resp:  [19-20] 19 (05/21 0540) BP: (101-125)/(63-74) 105/68 (05/21 0540) SpO2:  [92 %-100 %] 95 % (05/21 0540) Weight:  [75.3 kg] 75.3 kg (05/21 0540)  Physical Exam: Const: Pleasantly confused, in no apparent distress HEENT: Atraumatic, normocephalic  Resp: CTA BL, no wheezes, crackles, rhonchi CV: Mildly tachycardic, no murmurs, gallop, rub Abd: Bowel sounds present, nondistended, nontender to palpation Ext: 3-4+ lower extremity pitting edema up to the knees Neuro: Pleasantly confused.  Alert and oriented to name and place    Laboratory: Recent Labs  Lab 09/01/20 0159 09/02/20 0457 09/07/20 0234  WBC 5.9 4.8 4.7  HGB 11.8* 12.1 12.6  HCT 38.2 38.6 39.7  PLT 169 179 139*   Recent Labs  Lab 09/05/20 0318 09/06/20 0601 09/06/20 0603 09/07/20 0234  NA 141 140  --  140  K 4.0 3.9  --  3.5  CL 100 99  --  98  CO2 26 28  --  26  BUN 69* 71*  --  73*  CREATININE 2.65* 2.86*  --  3.00*  CALCIUM 9.6 9.2  --   9.1  PROT  --   --  6.0*  --   BILITOT  --   --  1.4*  --   ALKPHOS  --   --  59  --   ALT  --   --  29  --   AST  --   --  36  --   GLUCOSE 130* 133*  --  121*   Imaging/Diagnostic Tests: No new imaging    Jean Rosenthal, MD 09/07/2020, 6:36 AM PGY-1, [place] Intern pager: [phone], text pages welcome

## 2020-09-07 NOTE — Progress Notes (Signed)
    Chart reviewed, per palliative note plans for hospice likely Monday. Chart reviewed peripherally today on rounds.    Patient Profile     75 y.o. female with a hx of chronic combined systolic and diastolic heart failure (EF 20 to 25%), severe mitral regurgitation status post MitraClip 02/02/2019, dementia, GI bleeding,CKDseen for CHF.   Assessment & Plan    1. Acute on chronic combined systolic/diastolic HF - 45/0388 echo: LVEF 20-25% - 08/2020 echo LVEF < 82%, indet diastolic fxn, mod RV dysfunction, mitra clip with mod to severe MR - -Pt presented with acute volume overload with BNP>4500, CXR mild HF - +80 according to notes, neg 1.7 L since admission. Changed to oral torsemide 50mg  bid yesterday. Cr up to 3 today.  NoACE/ARB/ARNI, spironolactone, or SGLT2idue to CKD. No beta blocker due to severe LV dysfunction, low output.  - difficultly diuresing due to severe HD, advanced kidney disease. Not a candidate for advanced heart failure therapies, not an HD candidate - palliative following, family meeting planned for today to address goals of care.    2.Troponin elevation: -HsT, 122>>127 felt to be secondary to demand ischemia due to fluisd volume overload    Palliative note reivewed, plans for hospice. Continue current cardiac medications, no additional recs at this time.     For questions or updates, please contact [place] Please consult [email or web address] for contact info under        Signed, Carlyle Dolly, MD  09/07/2020, 9:15 AM

## 2020-09-07 NOTE — Progress Notes (Signed)
Nephrology Follow-Up Consult note   Assessment/Recommendations: Hannah [name] is a/an 75 y.o. female with a past medical history significant for HFrEF, GI bleed, HTN, dementia, anxiety who present w/ heart failure exacerbation complicated by AKI  Nonoliguric AKI on CKD 3B: Baseline creatinine fluctuates but typically around 1.3-1.5.  Now with acute kidney injury.  Creatinine 2.5 on admission and rising; although slowly. Likely 2/2 end stage heart failure and cardiorenal syndrome -Defer diuretics to primary and cardiology; complex situation where she has edema but her cardiac dynamics are too complex to make a simple decision. Normally would get a RHC but this is not currently within out recommendations given she is not a candidate for advanced therapies -Continue goals of care conversations -Continue to monitor daily Cr, Dose meds for GFR -Monitor Daily I/Os, Daily weight  -Maintain MAP>65 for optimal renal perfusion.  -Avoid nephrotoxic medications including NSAIDs and Vanc/Zosyn combo -Renal ultrasound and urinalysis without significant concern -Not a candidate for dialysis at this time  Volume Status/heart failure exacerbation: Severe heart failure follows with cardiology outpatient.  Her EF is less than 20%.  She likely is getting to a point of end-stage heart failure at this time.  Dr. Sallyanne Kuster and seem to be in agreement that there is little we can offer her.  Confusion/dementia: Worsening confusion for 1.5 months now with significant delirium.  Psychiatry following.  This is 1 huge barrier to outpatient dialysis.  In her current mental state she is not a dialysis candidate  Goals of care: Greatly appreciate palliative care involvement.  Will continue to follow for now but likely sign off soon given there is minimal things we can offer at this time.   Recommendations conveyed to primary service.    [facility] 09/07/2020 8:48  AM  ___________________________________________________________  CC: AKI on CKD 3B  Interval History/Subjective: Patient states she feels well today.  She knows that she is in the hospital but does not know why.  When discussing that if she has heart problems she states that she does not remember hearing this before.  Kidney function relatively stable.  Urine output minimal.   Medications:  Current Facility-Administered Medications  Medication Dose Route Frequency Provider Last Rate Last Admin  . 0.9 %  sodium chloride infusion  250 mL Intravenous PRN Meccariello, Bernita Raisin, DO      . acetaminophen (TYLENOL) tablet 650 mg  650 mg Oral Q6H Paige, Victoria J, DO   650 mg at 09/07/20 0255  . ARIPiprazole (ABILIFY) tablet 7.5 mg  7.5 mg Oral Daily Suella Broad, FNP   7.5 mg at 09/06/20 0951  . atorvastatin (LIPITOR) tablet 80 mg  80 mg Oral Daily Meccariello, Bernita Raisin, DO   80 mg at 09/06/20 0951  . diclofenac Sodium (VOLTAREN) 1 % topical gel 2 g  2 g Topical QID Paige, Victoria J, DO   2 g at 09/06/20 2141  . divalproex (DEPAKOTE SPRINKLE) capsule 250 mg  250 mg Oral Q12H Suella Broad, FNP   250 mg at 09/06/20 2134  . enoxaparin (LOVENOX) injection 30 mg  30 mg Subcutaneous Q24H Meccariello, Bailey J, DO   30 mg at 09/05/20 1351  . HYDROcodone-acetaminophen (NORCO/VICODIN) 5-325 MG per tablet 1 tablet  1 tablet Oral Q4H PRN Dellinger, Bobby Rumpf, PA-C   1 tablet at 09/07/20 0255  . lidocaine (LIDODERM) 5 % 2 patch  2 patch Transdermal Q24H Rosezella Rumpf, NP   2 patch at 09/06/20 1420  .  pantoprazole (PROTONIX) EC tablet 40 mg  40 mg Oral Daily Meccariello, Bernita Raisin, DO   40 mg at 09/06/20 0951  . polyethylene glycol (MIRALAX / GLYCOLAX) packet 17 g  17 g Oral Daily Meccariello, Bailey J, DO   17 g at 09/04/20 0817  . ramelteon (ROZEREM) tablet 8 mg  8 mg Oral QHS Rosezella Rumpf, NP   8 mg at 09/06/20 2134  . senna (SENOKOT) tablet 8.6 mg  1 tablet Oral QHS  Dellinger, Marianne L, PA-C   8.6 mg at 09/06/20 2134  . sodium chloride flush (NS) 0.9 % injection 3 mL  3 mL Intravenous Q12H Meccariello, Bailey J, DO   3 mL at 09/06/20 0936  . sodium chloride flush (NS) 0.9 % injection 3 mL  3 mL Intravenous PRN Meccariello, Bernita Raisin, DO      . torsemide (DEMADEX) tablet 50 mg  50 mg Oral BID Jean Rosenthal, MD          Review of Systems: 10 systems reviewed and negative except per interval history/subjective  Physical Exam: Vitals:   09/06/20 2001 09/07/20 0540  BP: 125/74 105/68  Pulse: (!) 107 100  Resp: 20 19  Temp: (!) 97.4 F (36.3 C) 97.9 F (36.6 C)  SpO2: 92% 95%   Total I/O In: 120 [P.O.:120] Out: 100 [Urine:100]  Intake/Output Summary (Last 24 hours) at 09/07/2020 0848 Last data filed at 09/07/2020 0845 Gross per 24 hour  Intake 600 ml  Output 300 ml  Net 300 ml   Constitutional: well-appearing, no acute distress ENMT: ears and nose without scars or lesions, MMM CV: normal rate, 2+ pitting edema in the bilateral lower extremities up to the thigh Respiratory: Bilateral chest rise, normal work of breathing Gastrointestinal: soft, non-tender, no palpable masses or hernias Skin: no visible lesions or rashes Psych: Awake, alert, poor judgment/insight, mood labile   Test Results I personally reviewed new and old clinical labs and radiology tests Lab Results  Component Value Date   NA 140 09/07/2020   K 3.5 09/07/2020   CL 98 09/07/2020   CO2 26 09/07/2020   BUN 73 (H) 09/07/2020   CREATININE 3.00 (H) 09/07/2020   GLU 110 03/09/2019   CALCIUM 9.1 09/07/2020   ALBUMIN 3.3 (L) 09/06/2020   PHOS 4.4 02/28/2019

## 2020-09-07 NOTE — Progress Notes (Addendum)
Occupational Therapy Treatment Patient Details Name: Hannah [name] MRN: [id number] DOB: [birth year] Today's Date: 09/07/2020    History of present illness 75 y.o. female referred to [facility] by PCP after labs showed an AKI, and in the setting of a suspected CHF exacerbation. Pt  presents with BLE edema, AKI, and right 4th toe redness. Pt was diagnosed with acute on chronic CHF exacerbation, cellulitis of R 4th toe 5/11. PMH includes chronic HFrEF, HTN, dementia, GI bleed s/p mesenteric artery coil embolization, anxiety, severe mitral regurgitation s/p MitraClip 02/02/19, symtpomatic anemia, insomnia.   OT comments  Pt. Seen for skilled OT treatment session. Focus of session was completion of grooming tasks with limited instruction to address anticipatory awareness and problem solving with use of a familiar task .  Pt. Seated and was able to complete 3 consecutive tasks with intermittent cues and redirection.  Pt. Self distracts with conversation but was receptive to gentle redirection and able to resume and complete the desired task.  Continue with current POC and acute OT goals.    Follow Up Recommendations  Home health OT;Supervision/Assistance - 24 hour    Equipment Recommendations  None recommended by OT    Recommendations for Other Services      Precautions / Restrictions Precautions Precautions: Fall       Mobility Bed Mobility                    Transfers                      Balance                                           ADL either performed or assessed with clinical judgement   ADL Overall ADL's : Needs assistance/impaired     Grooming: Oral care;Brushing hair;Sitting Grooming Details (indicate cue type and reason): applied lotion.  Some assistance required for putting t.paste on brush.  Pt. Able to un screw the cap but could not figure out how to get the paste on the brush. Had squeezed it but it was stuck on the tube and she was  not attempting to transfer it to the bristles.  Once guided pt. Able to continue with brushing her teeth.  Cues to stop talking while brushing.  Would engage in conversation with t.paste and saliva in her mouth.  Cues to stop talking and to spit in basin.  No cues required for lotion, cues for hair combing as she only brushed L side then began talking and stopped brushing.  Cued to brush R side and was able but would continue to stop task and talk.                                 General ADL Comments: able to complete 3 conseutive grooming tasks. intermittent cues for redirection from conversations but pt. responded well to redirection and resumed task     Vision       Perception     Praxis      Cognition Arousal/Alertness: Awake/alert Behavior During Therapy: WFL for tasks assessed/performed Overall Cognitive Status: Impaired/Different from baseline Area of Impairment: Following commands;Safety/judgement                       Following Commands:  Follows one step commands consistently Safety/Judgement: Decreased awareness of deficits;Decreased awareness of safety Awareness: Emergent Problem Solving: Requires verbal cues General Comments: Hx of dementia. Pt following one step commands consistently. '        Exercises     Shoulder Instructions       General Comments      Pertinent Vitals/ Pain       Pain Assessment: No/denies pain  Home Living                                          Prior Functioning/Environment              Frequency  Min 2X/week        Progress Toward Goals  OT Goals(current goals can now be found in the care plan section)  Progress towards OT goals: Progressing toward goals     Plan Discharge plan remains appropriate    Co-evaluation                 AM-PAC OT "6 Clicks" Daily Activity     Outcome Measure   Help from another person eating meals?: None Help from another person taking  care of personal grooming?: A Little Help from another person toileting, which includes using toliet, bedpan, or urinal?: A Little Help from another person bathing (including washing, rinsing, drying)?: A Little Help from another person to put on and taking off regular upper body clothing?: None Help from another person to put on and taking off regular lower body clothing?: A Little 6 Click Score: 20    End of Session    OT Visit Diagnosis: Other abnormalities of gait and mobility (R26.89);Muscle weakness (generalized) (M62.81);Other symptoms and signs involving cognitive function   Activity Tolerance Patient tolerated treatment well   Patient Left in chair;with nursing/sitter in room   Nurse Communication          Time: 0929-5747 OT Time Calculation (min): 12 min  Charges: OT General Charges $OT Visit: 1 Visit OT Treatments $Self Care/Home Management : 8-22 mins  Sonia Baller, COTA/L Acute Rehabilitation [phone]    09/07/2020, 11:47 AM

## 2020-09-07 NOTE — Progress Notes (Addendum)
Unable to complete assessment; patient remains uncooperative due to confusion. PO meds refused, MD made aware. Will reattempt med pass at 2200.  Addendum: unable to pass meds. MD notified. MD assessed patient and was successful in providing some of the scheduled medication (see MAR).

## 2020-09-08 DIAGNOSIS — I340 Nonrheumatic mitral (valve) insufficiency: Secondary | ICD-10-CM | POA: Diagnosis not present

## 2020-09-08 DIAGNOSIS — N183 Chronic kidney disease, stage 3 unspecified: Secondary | ICD-10-CM | POA: Diagnosis not present

## 2020-09-08 DIAGNOSIS — R600 Localized edema: Secondary | ICD-10-CM | POA: Diagnosis not present

## 2020-09-08 DIAGNOSIS — R7989 Other specified abnormal findings of blood chemistry: Secondary | ICD-10-CM | POA: Diagnosis not present

## 2020-09-08 DIAGNOSIS — I5043 Acute on chronic combined systolic (congestive) and diastolic (congestive) heart failure: Secondary | ICD-10-CM | POA: Diagnosis not present

## 2020-09-08 LAB — RENAL FUNCTION PANEL
Albumin: 3.7 g/dL (ref 3.5–5.0)
Anion gap: 17 — ABNORMAL HIGH (ref 5–15)
BUN: 76 mg/dL — ABNORMAL HIGH (ref 8–23)
CO2: 29 mmol/L (ref 22–32)
Calcium: 9.4 mg/dL (ref 8.9–10.3)
Chloride: 94 mmol/L — ABNORMAL LOW (ref 98–111)
Creatinine, Ser: 3.02 mg/dL — ABNORMAL HIGH (ref 0.44–1.00)
GFR, Estimated: 15 mL/min — ABNORMAL LOW (ref >60)
Glucose, Bld: 123 mg/dL — ABNORMAL HIGH (ref 70–99)
Phosphorus: 5.5 mg/dL — ABNORMAL HIGH (ref 2.5–4.6)
Potassium: 3.3 mmol/L — ABNORMAL LOW (ref 3.5–5.1)
Sodium: 140 mmol/L (ref 135–145)

## 2020-09-08 MED ORDER — DIVALPROEX SODIUM 125 MG PO CSDR
250.0000 mg | DELAYED_RELEASE_CAPSULE | Freq: Every morning | ORAL | Status: DC
  Administered 2020-09-09 – 2020-09-11 (×3): 250 mg via ORAL
  Filled 2020-09-08 (×3): qty 2

## 2020-09-08 MED ORDER — DIVALPROEX SODIUM 125 MG PO CSDR
500.0000 mg | DELAYED_RELEASE_CAPSULE | Freq: Every day | ORAL | Status: DC
  Administered 2020-09-09 – 2020-09-10 (×3): 500 mg via ORAL
  Filled 2020-09-08 (×3): qty 4

## 2020-09-08 MED ORDER — POTASSIUM CHLORIDE CRYS ER 20 MEQ PO TBCR
40.0000 meq | EXTENDED_RELEASE_TABLET | Freq: Once | ORAL | Status: AC
  Administered 2020-09-08: 40 meq via ORAL
  Filled 2020-09-08: qty 2

## 2020-09-08 NOTE — TOC Progression Note (Signed)
Transition of Care [facility]) - Progression Note    Patient Details  Name: Hannah [name] MRN: [id number] Date of Birth: 04/12/1946  Transition of Care [facility]) CM/SW Contact  Pollie Friar, RN Phone Number: 09/08/2020, 12:34 PM  Clinical Narrative:    TOC receive consult from palliative care for home with hospice through [place]. CM has sent referral to [facility] with [facility].  Prior consult for palliative with [facility]. CM called [place] [facility] and they did not have her on their list.  TOC following for timing to d/c home once hospice has needed DME and arrangements for home.   Expected Discharge Plan: Home w Hospice Care Barriers to Discharge: Continued Medical Work up  Expected Discharge Plan and Services Expected Discharge Plan: [place] In-house Referral: Hospice / Palliative Care Discharge Planning Services: CM Consult Post Acute Care Choice: [place] arrangements for the past 2 months: Single Family Home                                       Social Determinants of Health (SDOH) Interventions    Readmission Risk Interventions Readmission Risk Prevention Plan 02/03/2019  Transportation Screening Complete  Medication Review Press photographer) Complete  PCP or Specialist appointment within 3-5 days of discharge Complete  HRI or [place] Complete  SW Recovery Care/Counseling Consult Complete  [place] Not Applicable  Some recent data might be hidden

## 2020-09-08 NOTE — Progress Notes (Signed)
   09/08/20 0451  Assess: MEWS Score  Temp 98.2 F (36.8 C)  BP 100/88  Pulse Rate (!) 111  Resp 18  SpO2 99 %  Assess: MEWS Score  MEWS Temp 0  MEWS Systolic 1  MEWS Pulse 2  MEWS RR 0  MEWS LOC 0  MEWS Score 3  MEWS Score Color Yellow  Assess: if the MEWS score is Yellow or Red  Were vital signs taken at a resting state? Yes  Focused Assessment No change from prior assessment  Early Detection of Sepsis Score *See Row Information* Medium  MEWS guidelines implemented *See Row Information* Yes  Treat  MEWS Interventions Escalated (See documentation below)  Pain Scale 0-10  Pain Score 3  Take Vital Signs  Increase Vital Sign Frequency  Yellow: Q 2hr X 2 then Q 4hr X 2, if remains yellow, continue Q 4hrs  Escalate  MEWS: Escalate Yellow: discuss with charge nurse/RN and consider discussing with provider and RRT  Notify: Charge Nurse/RN  Name of Charge Nurse/RN Notified [place] RN  Date Charge Nurse/RN Notified 09/08/20  Time Charge Nurse/RN Notified [phone]  Document  Patient Outcome Not stable and remains on department  Progress note created (see row info) Yes

## 2020-09-08 NOTE — Progress Notes (Signed)
Manufacturing engineer ([facility])      Received referral for hospices services at home after discharge. Pt is a current palliative patient with [facility].  Chart under review at this time by [facility] MD.  Hospice eligibility pending.   Spoke with pt's daughter, Barbera Setters, to confirm interest in hospice and to begin education on hospice services in home.  Sheri verbalized understanding of information provided. Discharge date unknown at this time.  DME discussed.  Sheri requested BSC to be delivered.  This will be ordered for delivery on Monday.    Please ensure that the signed DNR transports home with the patient. Please order medications to ensure pt's comfort until she can be admitted onto hospice services.   Contact information given to [facility].  TOC Kelli made aware.   Thank you for the opportunity to participate in this patient's care.     Domenic Moras, BSN, RN [facility] Liaison [phone] (24h on call)

## 2020-09-08 NOTE — Progress Notes (Signed)
Daily Progress Note   [name]       Date: 09/08/2020 DOB: 1945/07/09  Age: 75 y.o. MRN#: [id number] Attending Physician: Hannah La, MD Primary Care Physician: Hannah Covert, MD Admit Date: 08/30/2020  Reason for Consultation/Follow-up:  To discuss complex medical decision making related to patient's goals of care  Examined patient at bedside.  She seems happy and comfortable today with no complaints.  She tells me she slept well.  Her sitter tells me the patient did not sleep well.   Subjective: Spoke with daughter Hannah [name] and son Hannah [name] on the phone.  Discussed patient's current symptom management.  Olanzapine did seem to increase QTC even further. Explained the risk of sudden cardiac death.  Also explained Olanzapine typically starts to work better after 48 hours.  After some discussion of risk versus benefit Hannah [name] and Hannah [name] that it would be appropriate to try the olanzapine one more night to see if it did help their mother's symptoms of agitation and sundowning.   Also discussed hydrocodone and  Depakote.   Adjusted the dosage of Depakote increasing the PM dose to hopefully encourage sleep without worsening the risk of QTC prolongation.  We agreed to reassess in the morning.  Called [facility] to ensure they were in the loop and planning to enroll Hannah [name] on discharge.     Assessment: Patient comfortable this morning.  Sitter at bedside.  Planning to go home with hospice possibly Monday.   Patient Profile/HPI:  Per intake H&P -->Hannah B Robertsis a 75 y.o.femalepresenting with BLE edema, AKI, right 4th toe redness. PMH is significant forchronic HFrEF (last Echo 01/24/20- EF 20-25%), HTN, Dementia, GI bleed s/p mesenteric artery coil  embolization, anxiety, severe mitral regurgitation s/p MitraClip (02/02/19), symptomatic anemia, insomnia.  Length of Stay: 7   Vital Signs: BP 108/81 (BP Location: Left Arm)   Pulse (!) 116   Temp 98.2 F (36.8 C)   Resp 16   Ht 5\' 2"  (1.575 m)   Wt 75.6 kg   SpO2 100%   BMI 30.47 kg/m  SpO2: SpO2: 100 % O2 Device: O2 Device: Room Air O2 Flow Rate:         Palliative Assessment/Data: 40%     Palliative Care Plan    Recommendations/Plan: Home with Hospice Services  when ready. On discharge please continue torsemide, hydrocodone, depakote, florastor, senna, miralax.  Will make a determination about Olanzapine on 5/23 am. After discussion with family attempted to reduce pill burden.  Will trial discontinuation of protonix.  Will DC statin.   Code Status:  DNR  Prognosis: Weeks to perhaps months.  Discharge Planning: Home with Hospice  Care plan was discussed with family.  Thank you for allowing the Palliative Medicine Team to assist in the care of this patient.  Total time spent:  45 min.     Greater than 50%  of this time was spent counseling and coordinating care related to the above assessment and plan.  Hannah Jenny, PA-C Palliative Medicine  Please contact Palliative MedicineTeam phone at [phone] for questions and concerns between 7 am - 7 pm.   Please see AMION for individual provider pager numbers.

## 2020-09-08 NOTE — Progress Notes (Signed)
Family Medicine Teaching Service Daily Progress Note Intern Pager: [phone]  [name] Medical record number: [id number] Date of birth: June 01, 1945 Age: 75 y.o. Gender: female  Primary Care Provider: [name], MD Consultants:  Cards, Nephrology  Code Status: Full  Pt Overview and Major Events to Date:  5/14- Admitted  Assessment and Plan: Hannah Windom Robertsis a 75 y.o.femalewho presents with bilateral lower extremity edema secondary to HFrEF exacerbation found to have osteomyelitis of R4th toe. PMHx significant for HFrEF, severe MVR (s/p MitraClip 01/2019), dementia, GI bleed s/p mesenteric artery coil embolization, symptomatic anemia, insomnia.  HFrEF exacerbation Had urine output ~500 ml yesterday, volume removal continues to be a challenge due to her ongoing cardiorenal syndrome.  Due to palliative plans to transfer patient to hospice care, likely tomorrow cardiology suggested to continue current medications and signed off at this time. --Appreciate cardiology recs --Continue oral torsemide --Holdmetoprolol succinate 50 mg in the setting of severely reduced heart failure --strict I/O --daily weights - Daily updates for daughter  Hypokalemia K 3.3 this a.m., received 40 mg KCl once. --Daily BMP  Prolonged QTc EKG shows QTC of 548 this morning.  Patient recently started on Zyprexa and that is a QTC prolonging medication.  Palliative care informed daughter about the medication and side effect but daughter still wants to continue the medication at least tonight and then repeat EKG in morning and would make further decision with palliative care. --c/w Zyprexa --EKG in a.m. --Avoid QTC prolonging agents  Dementia  Hyperactive delirium Anxiety Patient continues to require 1:1.  Pleasantly demented, did not sleep at night but sleeping comfortably this morning. There are times where there have been reports of patient being hostile to  sitters --Palliative care following, appreciate involvement with GOC discussion --Continue 1:1 sitter, redirect if possible --Continue Depakote250 mg BID -- c/w Zyprexa 5mg  --holdhome Buspar --c/wramelteon 8 mg qhs -- avoid antipsychotics and/or benzodiazepines if possible given prolonged QTc  -- Daily updates for daughter -- delirium precautions -- Appreciate psychiatry recommendations  Cellulitis of right 4th toe Osteomyelitis of right 4th toe X-ray showed findings consistent with osteomyelitis of fourth middle and distal phalanx.  Palliative care is following regarding goals of care.  Plan is to continue long-term oral antibiotics. --Palliative care following, appreciate their assistance -- cephalexin 500 mg q8h (5/14-5/20)-completed -- Doxycycline 100 mg BID(5/20-) --consider colchicine if no improvement with antibiotics (though would be careful givenCKD)  Chronic Back Pain -- Scheduled Tylenol 650 mg q6hr per Pharmacy recs -- Oxycodone 2.5mg  Q4 PRNfor severe breakthrough pain  Tick bite Received 1 dose oral doxycycline5/16. -- No further treatment necessary  AoCKD IIIb Serum creatinine today is 3.02 from 3.00 yesterday.  Her baseline creatinine is about 1.2.  This is likely in the setting of poor renal perfusion from her severely reduced EF. -- DailyBMP -- Continue Torsemide -- See diuresis per above --Appreciate nephrology recommendations  Mitral valve regurgitation Patient is s/p MitraClip 02/03/2019. TTE this admission revealing moderate to severe mitral valve regurgitation, MitraClip is in place. Underlying valve disorder possibly contributing to CHF.  Insomnia Likely related to dementia. Trazodone discontinued due to concerns of hallucinations. -- ramelteon as above  GERD -- continue PPI  HTN --Holding home Metoprolol  HLD -- continue home statin  Incidental pulmonary nodule Seen on CTA 09/2019.  -- repeat outpatient June  2022  Candidal Intertrigo (resolved) Noted on admission exam under breasts and in groin.  Resolved as of yesterday. -- D/C nystatin ointment  Subclinical  hypothyroidism TSH-6.265.  Free T4 unremarkable at 0.8.  Can consider repeat TSH in about 6 weeks   FEN/GI:  Regular diet PPx: Lovenox2 Status is: Inpatient  Remains inpatient appropriate because:Hemodynamically unstable and Persistent severe electrolyte disturbances   Dispo: The patient is from: Home              Anticipated d/c is to: Home              Patient currently is not medically stable to d/c.   Difficult to place patient No   Subjective:  Patient received Zyprexa 5 mg last night, unable to sleep whole night. Evaluated at bedside this morning and was soundly sleeping.  Sitter present in the room and informed patient was up until 7 this morning but was able to sleep after that and had denied her breakfast and medications.  Phone conversation with daughter: Ms. Hannah [name] is updated about patient's current situation.  She received a phone call from palliative care and they already informed her about patient being on Zyprexa and that is leading to prolonging of QTC on EKG but they still plan to continue medication tonight and recheck EKG in morning and then decide with palliative care.  Daughter is informed about risk and benefit of QTC prolongation.  Another conversation was about liberating patient's diet as patient has been craving bacon and daughter would like the diet to be liberated to regular diet.  Objective: Temp:  [97.6 F (36.4 C)-98.4 F (36.9 C)] 98.2 F (36.8 C) (05/22 0451) Pulse Rate:  [87-117] 111 (05/22 0451) Resp:  [14-18] 18 (05/22 0451) BP: (88-110)/(59-88) 100/88 (05/22 0451) SpO2:  [92 %-99 %] 99 % (05/22 0451) Weight:  [166 lb 9.6 oz (75.6 kg)] 166 lb 9.6 oz (75.6 kg) (05/22 0309)  Physical Exam: General: Patient lying comfortably in bed, NAD Respiratory: Clear to auscultation bilaterally, no  wheezes, crackles, rhonchi Cardiovascular:, No murmurs, gallops or rubs Abdomen: Soft, nondistended, nontender, bowel sounds present Extremity: 3+ lower extremity pitting edema up to knees   Laboratory: Recent Labs  Lab 09/02/20 0457 09/07/20 0234  WBC 4.8 4.7  HGB 12.1 12.6  HCT 38.6 39.7  PLT 179 139*   Recent Labs  Lab 09/06/20 0601 09/06/20 0603 09/07/20 0234 09/08/20 0328  NA 140  --  140 140  K 3.9  --  3.5 3.3*  CL 99  --  98 94*  CO2 28  --  26 29  BUN 71*  --  73* 76*  CREATININE 2.86*  --  3.00* 3.02*  CALCIUM 9.2  --  9.1 9.4  PROT  --  6.0*  --   --   BILITOT  --  1.4*  --   --   ALKPHOS  --  59  --   --   ALT  --  29  --   --   AST  --  36  --   --   GLUCOSE 133*  --  121* 123*   Imaging/Diagnostic Tests: No new imaging    Honor Junes, MD 09/08/2020, 9:16 AM PGY-1, [place] Intern pager: [phone], text pages welcome

## 2020-09-08 NOTE — Progress Notes (Signed)
Brief Nephrology Update Note  Based on palliative care conversation focus will be directed at comfort. Creatinine is stable today. Diuretics per primary team. We will sign off at this time. Please let us know if further help is needed.

## 2020-09-08 NOTE — Hospital Course (Addendum)
Hannah [name] is a 75 y.o. female who presents with bilateral lower extremity edema secondary to HFrEF exacerbation. PMHx significant for HFrEF, severe MVR (s/p MitraClip 01/2019), dementia, GI bleed s/p mesenteric artery coil embolization, symptomatic anemia, insomnia.  Acute on Chronic Systolic Heart Failure, HFrEF exacerbation Presented with bilateral lower extremity pitting edema first noticed by daughter on 5/11.  Patient's blood pressure medications stopped given soft blood pressures. Remained stable on Room Air.  Cardiology consulted.  ECHO obtained on 08/31/20 showed EF EF less than 20%, moderately reduced RV function, moderate to severe MR, mild to moderate MS.  Initially started on IV Diuresis.  Also received dose of Metolazone. Initial weight was 80kg with dry weight of 70kg.  Able to be diureses to 75 kg.  Fluid in legs improved only minimally.  Nephrology spoken with and [name] not believe candidate for Dialysis.  Cardiology eventually decided that PO Diuresis better option given no further appropriate hospital intervention for patient to be able to go home.   AoCKD IIIb Patient presented with Cr of 2.5, previous bseline of 1.4.  Remained fluid overloaded while hospitalized despite significant diuresis.  Thought likely due to Cardiorenal syndrome.  Nephrology spoken with and indicated was not candidate for dialysis.  Kidney ultrasound obtained and was normal. Creatinine on discharge was 3.43.        Dementia Had 1:1 sitter throughout hospital stay.  Patient had difficulty throughout stay with insomnia.  Buspar stopped on admission. Was seen by Psych who stopped patient's Abilify and started Depakote and Zyprexa. EKG's were obtained and decided was fine to continue Zyprexa.  Palliative reached out to daughter regarding Mother's prognosis and decided to make patient DNR and that home hospice best path going forward for patient.  DNR form signed prior to discharge.   Chronic Back Pain Patient  received Tylenol throughout hospitalization to manage.  After discussion with Palliative elected to add on Vicodin as needed to better control pain and offer relief.  Cellulitis/Osteomyeltis of right 4th toe Patient presented with swelling and redness of toe.  Had no fever or systemic symptoms and no concern for sepsis.  Started on Keflex 500 mg TID.  Pain improved in toe.  After lack of overall improvement after 7 days of Keflex, obtained X-Ray that showed findings concerig Osteomyelitis.  Given patient plan for at home hospice was started on Doxycycline with a course duration until 7/2.  Candidal Intertrigo (resolved) Presented with under breasts and in groin.  Resolved with Nystatin cream.

## 2020-09-08 NOTE — Progress Notes (Signed)
Patient continues to say she is dizzy and cant breath. Patient will not sit still even though saying she's dizzy/SOB .Patient also refused to use oxygen cord that was provided and hoked up prior to my shift. Called front  Desk an hour ago to notify RN.

## 2020-09-08 NOTE — Progress Notes (Signed)
From palliative notes plans to transfer to hospice care. Continue current cardiac meds, we will sign off at this time   Carlyle Dolly MD

## 2020-09-09 DIAGNOSIS — I340 Nonrheumatic mitral (valve) insufficiency: Secondary | ICD-10-CM | POA: Diagnosis not present

## 2020-09-09 DIAGNOSIS — I5043 Acute on chronic combined systolic (congestive) and diastolic (congestive) heart failure: Secondary | ICD-10-CM | POA: Diagnosis not present

## 2020-09-09 DIAGNOSIS — R600 Localized edema: Secondary | ICD-10-CM | POA: Diagnosis not present

## 2020-09-09 DIAGNOSIS — Z515 Encounter for palliative care: Secondary | ICD-10-CM | POA: Diagnosis not present

## 2020-09-09 LAB — RENAL FUNCTION PANEL
Albumin: 3.4 g/dL — ABNORMAL LOW (ref 3.5–5.0)
Anion gap: 17 — ABNORMAL HIGH (ref 5–15)
BUN: 82 mg/dL — ABNORMAL HIGH (ref 8–23)
CO2: 28 mmol/L (ref 22–32)
Calcium: 9.2 mg/dL (ref 8.9–10.3)
Chloride: 96 mmol/L — ABNORMAL LOW (ref 98–111)
Creatinine, Ser: 2.89 mg/dL — ABNORMAL HIGH (ref 0.44–1.00)
GFR, Estimated: 16 mL/min — ABNORMAL LOW (ref >60)
Glucose, Bld: 90 mg/dL (ref 70–99)
Phosphorus: 6.1 mg/dL — ABNORMAL HIGH (ref 2.5–4.6)
Potassium: 3.6 mmol/L (ref 3.5–5.1)
Sodium: 141 mmol/L (ref 135–145)

## 2020-09-09 LAB — CBC
HCT: 43 % (ref 36.0–46.0)
Hemoglobin: 13.3 g/dL (ref 12.0–15.0)
MCH: 31.1 pg (ref 26.0–34.0)
MCHC: 30.9 g/dL (ref 30.0–36.0)
MCV: 100.7 fL — ABNORMAL HIGH (ref 80.0–100.0)
Platelets: 123 10*3/uL — ABNORMAL LOW (ref 150–400)
RBC: 4.27 MIL/uL (ref 3.87–5.11)
RDW: 14.9 % (ref 11.5–15.5)
WBC: 4.1 10*3/uL (ref 4.0–10.5)
nRBC: 0 % (ref 0.0–0.2)

## 2020-09-09 MED ORDER — POTASSIUM CHLORIDE CRYS ER 10 MEQ PO TBCR
10.0000 meq | EXTENDED_RELEASE_TABLET | Freq: Every day | ORAL | Status: DC
  Administered 2020-09-09: 10 meq via ORAL
  Filled 2020-09-09: qty 1

## 2020-09-09 NOTE — Progress Notes (Signed)
Heart Failure Stewardship Pharmacist Progress Note   PCP: [name], MD PCP-Cardiologist: Jenkins Rouge, MD    HPI:  75 yo F with PMH of CHF, severe MR s/p MitraClip in 2020, dementia, GI bleeding, and CKD. She was admitted on 08/30/20 for LE edema and CHF exacerbation. A CXR was done on 08/31/20 and found to have mild CHF. An ECHO was also done on 08/31/20 and LVEF was <20% (20-25% in 01/2020, 30-35% in 02/2019, and 60-65% in 10/2018).   Current HF Medications: Torsemide 50 mg BID  Prior to admission HF Medications: Furosemide 80 mg daily Metoprolol XL 50 mg daily  Pertinent Lab Values: . Serum creatinine 2.89, BUN 82, Potassium 3.6, Sodium 141, Magnesium 1.9, BNP >4500  Vital Signs: . Weight: 166 lbs (admission weight: 172 lbs) . Blood pressure: 100/80s . Heart rate: 100s . I/O: documented neg 50 mL yesterday, net neg 1.1L since admission   Medication Assistance / Insurance Benefits Check: Does the patient have prescription insurance?  Yes Type of insurance plan: [employer] Medicare  Does the patient qualify for medication assistance through manufacturers or grants?   Pending . Eligible grants and/or patient assistance programs: pending . Medication assistance applications in progress: none  . Medication assistance applications approved: none Approved medication assistance renewals will be completed by: pending  Outpatient Pharmacy:  Prior to admission outpatient pharmacy: [facility] Is the patient willing to use [facility] at discharge? Yes Is the patient willing to transition their outpatient pharmacy to utilize a [facility] outpatient pharmacy?   Pending    Assessment: 1. Acute on chronic systolic CHF (EF <32%), due to presumed NICM. NYHA class III symptoms. Also with AKI - SCr up to 2.9 (was ~1.5 prior to this admission). Plan to transition to palliative/hospice care. - Continue torsemide 50 mg BID   Plan: 1) Medication changes recommended at this time: -  None - transitioning to home with hospice. Will sign off. Thank you for allowing HF Pharmacy and Navigation team to participate in this patient's care.   Kerby Nora, PharmD, BCPS Heart Failure Stewardship Pharmacist Phone [phone]

## 2020-09-09 NOTE — Progress Notes (Signed)
Family Medicine Teaching Service Daily Progress Note Intern Pager: [phone]  [name] Medical record number: [id number] Date of birth: 1945/05/28 Age: 75 y.o. Gender: female  Primary Care Provider: [name], MD Consultants: Cards, Palliative Code Status: DNR  Pt Overview and Major Events to Date:  5/14- Admitted  Assessment and Plan: Luetta Piazza Robertsis a 75 y.o.femalewho presents with bilateral lower extremity edema secondary to HFrEF exacerbation found to have osteomyelitis of R4th toe. PMHx significant for HFrEF, severe MVR (s/p MitraClip 01/2019), dementia, GI bleed s/p mesenteric artery coil embolization, symptomatic anemia, insomnia.  HFrEF exacerbation No urine output yesterday.  Continues to have 3+ Peripheral Edema.  Cardiology recommending oral diuresis in setting of hospice care.   --Appreciate cardiology recs --Continue oral torsemide 50 mg BID --Will not restart Metoprolol --Strict I/O's stopped --daily weights - Daily updates for daughter  Hypokalemia K- 3.6 this morning.   --Daily BMP  Prolonged QTc Got dose of Zyprexa last night. --follow up EKG this AM --Now on Depakote 250 mg in AM, 500 mg at night --Avoid QTC prolonging agents  Dementia  Patient continues to require 1:1.  Pleasantly demented this AM.  There are times where there have been reports of patient being hostile to sitters.  Discussed with Palliative and ppan for hospice care at home. --Palliative care following, appreciate involvement with [place] discussion --Continue 1:1 sitter, redirect if possible --Now on Depakote 250 mg in AM, 500 mg at night --holdhome Buspar --c/wramelteon 8 mg qhs -- avoid antipsychotics and/or benzodiazepines if possible given prolonged QTc  -- Daily updates for daughter -- delirium precautions  Cellulitis of right 4th toe Osteomyelitis of right 4th toe X-ray on 5/20 showed findings consistent with osteomyelitis of fourth  middle and distal phalanx.  Palliative care is following regarding goals of care.  Plan is to continue long-term oral antibiotics. --Palliative care following, appreciate recs -- cephalexin 500 mg q8h (5/14-5/20)-completed -- Doxycycline 100 mg BID(5/20-)  Chronic Back Pain -- Scheduled Tylenol 650 mg q6hrper Pharmacy recs -- Vicodin started per Palliative, Hospice to manage going forward  AoCKD IIIb Serum creatinine down to 2.89 this morning from 3.02 yesterday.  Her baseline creatinine is about 1.2 prior to this hospitalization.  This is likely in the setting of poor renal perfusion from her severely reduced EF. -- DailyBMP -- Continue Torsemide 50 mg BID -- See diuresis per above -- Appreciate nephrology recommendations    [facility] Have had discussion with Palliative and interested in home hospice care.  - Touch base with SW regarding discharge  FEN/GI: Regular Diet PPx: Lovenox   Status is: Inpatient  Remains inpatient appropriate because:Altered mental status   Dispo: The patient is from: Home              Anticipated d/c is to: Home with hospice              Patient currently is medically stable to d/c.   Difficult to place patient No    Subjective:  Patient indicates feeling well this morning.  Objective: Temp:  [97.5 F (36.4 C)] 97.5 F (36.4 C) (05/22 1943) Pulse Rate:  [102-116] 102 (05/22 1943) Resp:  [16-18] 18 (05/22 1943) BP: (82-108)/(65-81) 82/65 (05/22 1943) SpO2:  [100 %] 100 % (05/22 1943) Physical Exam:  Physical Exam Constitutional:      General: She is not in acute distress.    Appearance: Normal appearance. She is not ill-appearing.  HENT:     Head: Normocephalic and  atraumatic.     Mouth/Throat:     Mouth: Mucous membranes are moist.  Cardiovascular:     Rate and Rhythm: Normal rate and regular rhythm.  Pulmonary:     Effort: Pulmonary effort is normal.     Breath sounds: Normal breath sounds.  Abdominal:     General: Abdomen is  flat.     Palpations: Abdomen is soft.  Musculoskeletal:     Right lower leg: Edema present.     Left lower leg: Edema present.     Comments: 3+ pitting edema  Skin:    General: Skin is warm.  Neurological:     General: No focal deficit present.     Mental Status: She is alert and oriented to person, place, and time.     Laboratory: Recent Labs  Lab 09/07/20 0234  WBC 4.7  HGB 12.6  HCT 39.7  PLT 139*   Recent Labs  Lab 09/06/20 0601 09/06/20 0603 09/07/20 0234 09/08/20 0328  NA 140  --  140 140  K 3.9  --  3.5 3.3*  CL 99  --  98 94*  CO2 28  --  26 29  BUN 71*  --  73* 76*  CREATININE 2.86*  --  3.00* 3.02*  CALCIUM 9.2  --  9.1 9.4  PROT  --  6.0*  --   --   BILITOT  --  1.4*  --   --   ALKPHOS  --  59  --   --   ALT  --  29  --   --   AST  --  36  --   --   GLUCOSE 133*  --  121* 123*     Imaging/Diagnostic Tests: No new imaging  Delora Fuel, MD 09/09/2020, 8:15 AM PGY-1, [place] Intern pager: [phone], text pages welcome

## 2020-09-09 NOTE — TOC Progression Note (Signed)
Transition of Care [facility]) - Progression Note    Patient Details  Name: Hannah [name] MRN: [id number] Date of Birth: 08-Sep-1945  Transition of Care [facility] CM/SW Contact  Zenon Mayo, RN Phone Number: 09/09/2020, 9:56 AM  Clinical Narrative:    Patient is set up with [facility] for Home Hospice, the only DME that is being delivered to the home is a BSC.  Patient will need ambulance transport at discharge.     Expected Discharge Plan: Home w Hospice Care Barriers to Discharge: Continued Medical Work up  Expected Discharge Plan and Services Expected Discharge Plan: [place] In-house Referral: Hospice / Palliative Care Discharge Planning Services: CM Consult Post Acute Care Choice: [place] arrangements for the past 2 months: Single Family Home                                       Social Determinants of Health (SDOH) Interventions    Readmission Risk Interventions Readmission Risk Prevention Plan 02/03/2019  Transportation Screening Complete  Medication Review Press photographer) Complete  PCP or Specialist appointment within 3-5 days of discharge Complete  HRI or [place] Complete  SW Recovery Care/Counseling Consult Complete  [facility] Not Applicable  Some recent data might be hidden

## 2020-09-09 NOTE — Progress Notes (Signed)
Updated daughter about plan to go home today.  Received DME.  Indicated was told by Palliative would call back today.  Delora Fuel, MD 09/09/2020, 1:08 PM PGY-1, [place] Medicine Service pager [phone]

## 2020-09-09 NOTE — Progress Notes (Signed)
Daily Progress Note   [name]       Date: 09/09/2020 DOB: 12/17/1945  Age: 75 y.o. MRN#: [id number] Attending Physician: Dickie La, MD Primary Care Physician: [name], MD Admit Date: 08/30/2020  Reason for Consultation/Follow-up: To discuss complex medical decision making related to patient's goals of care  Per sitter patient had a good night.    QTC is reduced from yesterday and is now 474.  Subjective: Patient is very pleasant with me.  No complaints of pain.  States she slept well last night.  She seems to become easily frustrated with a pillow behind her head, this is resolved with repositioning.  Called daughter [name] on the phone.  She has a migraine.  Hospice bed is being delivered when I call.  [name] and I talked about the effect of the medications and the QTC.  The QTC is improved today and almost normal.   [name] wanted to review the labs, EKG and results.    She will need a hospital bed in the home.  [name] asked me about discharge.  She would prefer Wed/Thurs.   I let her know that we would need a day to get the hospital bed there and that the discharge date is decided by the attending team.  [name] was appreciative for the care her mother has received this admission.    Assessment: Patient appears comfortable.  Reports are that she slept.  I'm hopeful that current dosages of olanzapine, Depakote, and hydrocodone are helpful.   Patient Profile/HPI:  Per intake H&P -->Hannah B Robertsis a 75 y.o.femalepresenting with BLE edema, AKI, right 4th toe redness. PMH is significant forchronic HFrEF (last Echo 01/24/20- EF 20-25%), HTN, Dementia, GI bleed s/p mesenteric artery coil embolization, anxiety, severe mitral regurgitation s/p  MitraClip (02/02/19), symptomatic anemia, insomnia.   Length of Stay: 8   Vital Signs: BP (!) 82/65 (BP Location: Right Arm)   Pulse (!) 102   Temp (!) 97.5 F (36.4 C) (Oral)   Resp 18   Ht 5\' 2"  (1.575 m)   Wt 75.6 kg   SpO2 100%   BMI 30.47 kg/m  SpO2: SpO2: 100 % O2 Device: O2 Device: Room Air O2 Flow Rate:         Palliative Assessment/Data: 50%  Palliative Care Plan    Recommendations/Plan: Patient will need a hospital bed in the home prior to discharge. Please DC patient on current doses of Depakote, olanzapine, hydrocodone and torsemide. Home with Hospice when appropriate per primary team.   Family requests Wed/Thursday. Palliative will sign off.   Family has our number if needed for questions or support.  Code Status:  DNR  Prognosis:  < 6 months perhaps weeks to months given advanced mixed with EF < 20% and mitral valve regurgitation in the setting of CKD 4 with a GFR of 15 - 16.  Discharge Planning: Home with Hospice  Care plan was [facility] with [facility].  Thank you for allowing the Palliative Medicine Team to assist in the care of this patient.  Total time spent:  35 min.     Greater than 50%  of this time was spent counseling and coordinating care related to the above assessment and plan.  Florentina Jenny, PA-C Palliative Medicine  Please contact Palliative MedicineTeam phone at [phone] for questions and concerns between 7 am - 7 pm.   Please see AMION for individual provider pager numbers.

## 2020-09-09 NOTE — Progress Notes (Signed)
On call for FMTS paged to make aware of pt. Sleeping. Pt. Has meds due at this time. Okay to hold per on call for FMTS.

## 2020-09-09 NOTE — Progress Notes (Signed)
Physical Therapy Treatment Patient Details Name: Hannah [name] MRN: [id number] DOB: 12-17-1945 Today's Date: 09/09/2020    History of Present Illness 75 y.o. female referred to [facility] by PCP after labs showed an AKI, and in the setting of a suspected CHF exacerbation. Pt  presents with BLE edema, AKI, and right 4th toe redness. Pt was diagnosed with acute on chronic CHF exacerbation, cellulitis of R 4th toe 5/11. PMH includes chronic HFrEF, HTN, dementia, GI bleed s/p mesenteric artery coil embolization, anxiety, severe mitral regurgitation s/p MitraClip 02/02/19, symtpomatic anemia, insomnia.    PT Comments    Patient pleasant for most of session, easily distracted and needs constant redirection to attend to task. Tolerated multiple SPTs with Min A- HHA x1 and with RW x1. Needs tactile cues to assist with following directional cues. Keeps eyes closed for most of session. Asking when her appt is for finding a new house. Pt reports itchy back so provided relief and donned lotion to help with dryness. RN present and wanting pt in bed for EKG. Will continue to follow and progress.   Follow Up Recommendations  Home health PT;Supervision/Assistance - 24 hour     Equipment Recommendations  None recommended by PT    Recommendations for Other Services       Precautions / Restrictions Precautions Precautions: Fall Restrictions Weight Bearing Restrictions: No    Mobility  Bed Mobility   Bed Mobility: Sit to Supine       Sit to supine: Supervision;HOB elevated   General bed mobility comments: No assist needed.    Transfers Overall transfer level: Needs assistance Equipment used: Rolling walker (2 wheeled);None Transfers: Sit to/from Stand Sit to Stand: Min guard Stand pivot transfers: Min guard;Min assist       General transfer comment: Min guard for safety. Stood from [employer], from [facility] x1, grabbing RW and then pushing it away, "I don't need this." HHA for SPT to get to [facility], use  of RW to get back to bed with max tactile cues to assist pt back towards bed.  Ambulation/Gait             General Gait Details: Deferred as nurse trying to perform EKG so wanted pt back in bed.   Stairs             Wheelchair Mobility    Modified Rankin (Stroke Patients Only)       Balance Overall balance assessment: Needs assistance Sitting-balance support: Feet supported;No upper extremity supported Sitting balance-Leahy Scale: Good     Standing balance support: During functional activity Standing balance-Leahy Scale: Poor Standing balance comment: Pt tolerates brief periods of standing/gait without UE support, but quickly reaches for furniture for stability.                            Cognition Arousal/Alertness: Awake/alert Behavior During Therapy: Flat affect Overall Cognitive Status: Impaired/Different from baseline Area of Impairment: Following commands;Safety/judgement;Awareness;Problem solving;Memory;Attention                   Current Attention Level: Focused;Sustained Memory: Decreased short-term memory Following Commands: Follows one step commands inconsistently;Follows one step commands with increased time Safety/Judgement: Decreased awareness of deficits;Decreased awareness of safety Awareness: Emergent Problem Solving: Requires verbal cues;Requires tactile cues General Comments: Hx of dementia. Pt following one step commands inconsistently requiring repetition and multimodal cues. Keeps eyes closed for most of session. Easily distracted.      Exercises  General Comments General comments (skin integrity, edema, etc.): Able to perform pericare without assist. Rn present in room. Pt breaking apart pill instead of taking it.      Pertinent Vitals/Pain Pain Assessment: Faces Faces Pain Scale: No hurt    Home Living                      Prior Function            PT Goals (current goals can now be found in  the care plan section) Progress towards PT goals: Not progressing toward goals - comment    Frequency    Min 3X/week      PT Plan Current plan remains appropriate    Co-evaluation              AM-PAC PT "6 Clicks" Mobility   Outcome Measure  Help needed turning from your back to your side while in a flat bed without using bedrails?: A Little Help needed moving from lying on your back to sitting on the side of a flat bed without using bedrails?: A Little Help needed moving to and from a bed to a chair (including a wheelchair)?: A Little Help needed standing up from a chair using your arms (e.g., wheelchair or bedside chair)?: A Little Help needed to walk in hospital room?: A Little Help needed climbing 3-5 steps with a railing? : A Lot 6 Click Score: 17    End of Session Equipment Utilized During Treatment: Gait belt Activity Tolerance: Other (comment) (limited by cognition, RN needing to do EKG) Patient left: in bed;with call bell/phone within reach;with nursing/sitter in room Nurse Communication: Mobility status PT Visit Diagnosis: Muscle weakness (generalized) (M62.81);Other abnormalities of gait and mobility (R26.89)     Time: 1211-1227 PT Time Calculation (min) (ACUTE ONLY): 16 min  Charges:  $Therapeutic Activity: 8-22 mins                     [name], PT, DPT Acute Rehabilitation Services Pager [phone] Office [place] 09/09/2020, 1:08 PM

## 2020-09-09 NOTE — Plan of Care (Signed)
  Problem: Clinical Measurements: Goal: Respiratory complications will improve Outcome: Not Progressing Pt. Yells out she can't breathe. O2 saturation WNL.   Problem: Coping: Goal: Level of anxiety will decrease Outcome: Not Progressing  Pt. Restless when awake. Problem: Safety: Goal: Ability to remain free from injury will improve Outcome: Not Progressing  Bed alarm on, safety sitter at bedside. Pt. Attempting to get OOB unassisted constantly.

## 2020-09-09 NOTE — Progress Notes (Signed)
Pt. Awake. Nighttime meds given.

## 2020-09-09 NOTE — Care Management Important Message (Signed)
Important Message  Patient Details  Name: Hannah [name] MRN: [id number] Date of Birth: [birth year]   Medicare Important Message Given:  Yes     [name] 09/09/2020, 10:04 AM

## 2020-09-10 DIAGNOSIS — I5023 Acute on chronic systolic (congestive) heart failure: Secondary | ICD-10-CM | POA: Diagnosis not present

## 2020-09-10 LAB — RENAL FUNCTION PANEL
Albumin: 3.2 g/dL — ABNORMAL LOW (ref 3.5–5.0)
Anion gap: 14 (ref 5–15)
BUN: 83 mg/dL — ABNORMAL HIGH (ref 8–23)
CO2: 29 mmol/L (ref 22–32)
Calcium: 8.7 mg/dL — ABNORMAL LOW (ref 8.9–10.3)
Chloride: 97 mmol/L — ABNORMAL LOW (ref 98–111)
Creatinine, Ser: 2.7 mg/dL — ABNORMAL HIGH (ref 0.44–1.00)
GFR, Estimated: 18 mL/min — ABNORMAL LOW (ref >60)
Glucose, Bld: 105 mg/dL — ABNORMAL HIGH (ref 70–99)
Phosphorus: 5.6 mg/dL — ABNORMAL HIGH (ref 2.5–4.6)
Potassium: 2.9 mmol/L — ABNORMAL LOW (ref 3.5–5.1)
Sodium: 140 mmol/L (ref 135–145)

## 2020-09-10 LAB — CBC
HCT: 38.1 % (ref 36.0–46.0)
Hemoglobin: 12 g/dL (ref 12.0–15.0)
MCH: 31.3 pg (ref 26.0–34.0)
MCHC: 31.5 g/dL (ref 30.0–36.0)
MCV: 99.5 fL (ref 80.0–100.0)
Platelets: 103 10*3/uL — ABNORMAL LOW (ref 150–400)
RBC: 3.83 MIL/uL — ABNORMAL LOW (ref 3.87–5.11)
RDW: 14.8 % (ref 11.5–15.5)
WBC: 3.6 10*3/uL — ABNORMAL LOW (ref 4.0–10.5)
nRBC: 0 % (ref 0.0–0.2)

## 2020-09-10 MED ORDER — POTASSIUM CHLORIDE CRYS ER 20 MEQ PO TBCR
40.0000 meq | EXTENDED_RELEASE_TABLET | Freq: Once | ORAL | Status: AC
  Administered 2020-09-10: 40 meq via ORAL
  Filled 2020-09-10: qty 2

## 2020-09-10 MED ORDER — POTASSIUM CHLORIDE CRYS ER 20 MEQ PO TBCR
40.0000 meq | EXTENDED_RELEASE_TABLET | Freq: Three times a day (TID) | ORAL | Status: DC
  Administered 2020-09-10: 40 meq via ORAL
  Filled 2020-09-10: qty 2

## 2020-09-10 NOTE — Progress Notes (Signed)
[employer] [facility]) Hospital Liaison Note:  Update on this Home with Hospice Referral received on 09/08/20:  Patient is eligible for hospice services in the home upon discharge.   Spoke with patient daughter this morning to support and confirm delivery of DME (bed/bsc). Family is now requesting that bed order be canceled for now as they are concerned that patient may not need or want the bed in the home at this time. Family may request hospital bed at a later date. TOC aware.  Per daughter, Family plans to transport patient home via personal vehicle once medically stable to discharge from hospital.   Please send completed DNR and arrange for any comfort scripts that may be needed for symptom management so there is no lapse inpatient comfort prior to hospice service beginning.  Thank you for allowing [place] to participate in this patient's care,  Gar Ponto, RN [facility] HLT  [phone]

## 2020-09-10 NOTE — Progress Notes (Signed)
PCP Visit  Hannah [name] is in good spirits the AM.  Denies any pain or shortness of breath.  Seems to know me but is not oriented.  Does not have a sitter  Appreciate the great care of our inpatient team.   Has made appropriate transition to hospice and comfort focus.  Continue to adjust her medications - diuretics, antibiotics and mood medications for her comfort.   I will follow after she leaves the hospital.

## 2020-09-10 NOTE — Progress Notes (Signed)
Spoke with patient's daughter by phone.  In agreement with plan to discharge tomorrow AM.   Delora Fuel, MD 09/10/2020, 3:05 PM PGY-1, [place] Medicine Service pager [phone]

## 2020-09-10 NOTE — Progress Notes (Signed)
Family Medicine Teaching Service Daily Progress Note Intern Pager: [phone]  [name] Medical record number: [id number] Date of birth: 02-Apr-1946 Age: 75 y.o. Gender: female  Primary Care Provider: [name], MD Consultants: Cards, Palliative Code Status: Full  Pt Overview and Major Events to Date:  5/14- Admitted  Assessment and Plan:  Eckersley Robertsis a 75 y.o.femalewho presents with bilateral lower extremity edema secondary to HFrEF exacerbationfound to have osteomyelitis of R4th toe. PMHx significant for HFrEF, severe MVR (s/p MitraClip 01/2019), dementia, GI bleed s/p mesenteric artery coil embolization, symptomatic anemia, insomnia.  GoC Have had discussion with Palliative and interested in home hospice care.  - Touch base with hospice and daughter regarding D/C  HFrEF exacerbation 550 mL Urine output yesterday.  2+ Peripheral edema.  Cardiology recommending oral diuresis in setting of hospice care.   - Continue Oral torsemide 50 mg BID - Daily weights  Hypokalemia K-2.9 this morning.   - Replete 60 mEQ  Dementia Patient continues to require one-to-one.  Pleasantly demented this a.m.  Plan to discharge home once hospital bed is delivered per Palliative.   - Palliative care following, appreciate involvement with goals of care discussion - Continue one-to-one sitter, redirect possible - Depakote to 50 mg in a.m. 500 mg p.m. - Zyprexa 5mg  daily - Ramelteon 8 mg nightly - Daily updates for daughter - Delirium precautions  Osteomyelitis of right 4th toe Currently on antibiotic treatment. - Doxycycline 100 mg BID   Chronic Back Pain - Tylenol 650 mg q6hr - Vicodin q4hr PRN   FEN/GI: Regular Diet PPx: Lovenox   Status is: Inpatient  Remains inpatient appropriate because:  Waiting for hospice bed   Dispo: The patient is from: Home              Anticipated d/c is to: Home with hospice              Patient currently is  medically stable to d/c.   Difficult to place patient No   Subjective:  Patient resting comfortably this morning.  Objective: Temp:  [97.3 F (36.3 C)-98.2 F (36.8 C)] 97.3 F (36.3 C) (05/24 0334) Pulse Rate:  [70-97] 70 (05/24 0334) Resp:  [18] 18 (05/24 0334) BP: (97-102)/(60-73) 102/73 (05/24 0334) SpO2:  [87 %-95 %] 95 % (05/24 0334) Weight:  [74.3 kg] 74.3 kg (05/24 0554) Physical Exam:  Physical Exam Constitutional:      Appearance: Normal appearance.  HENT:     Head: Normocephalic and atraumatic.     Mouth/Throat:     Mouth: Mucous membranes are moist.  Cardiovascular:     Rate and Rhythm: Normal rate and regular rhythm.  Pulmonary:     Effort: Pulmonary effort is normal.     Breath sounds: Normal breath sounds.  Musculoskeletal:     Right lower leg: Edema present.     Left lower leg: Edema present.     Comments: 2+ pitting edema  Neurological:     Mental Status: She is alert.     Laboratory: Recent Labs  Lab 09/07/20 0234 09/09/20 0714 09/10/20 0324  WBC 4.7 4.1 3.6*  HGB 12.6 13.3 12.0  HCT 39.7 43.0 38.1  PLT 139* 123* 103*   Recent Labs  Lab 09/06/20 0603 09/07/20 0234 09/08/20 0328 09/09/20 0714 09/10/20 0324  NA  --    < > 140 141 140  K  --    < > 3.3* 3.6 2.9*  CL  --    < >  94* 96* 97*  CO2  --    < > 29 28 29   BUN  --    < > 76* 82* 83*  CREATININE  --    < > 3.02* 2.89* 2.70*  CALCIUM  --    < > 9.4 9.2 8.7*  PROT 6.0*  --   --   --   --   BILITOT 1.4*  --   --   --   --   ALKPHOS 59  --   --   --   --   ALT 29  --   --   --   --   AST 36  --   --   --   --   GLUCOSE  --    < > 123* 90 105*   < > = values in this interval not displayed.     Imaging/Diagnostic Tests: No new imaging  Delora Fuel, MD 09/10/2020, 6:12 AM PGY-1, [place] Intern pager: [phone], text pages welcome

## 2020-09-11 ENCOUNTER — Other Ambulatory Visit (HOSPITAL_COMMUNITY): Payer: Self-pay

## 2020-09-11 LAB — RENAL FUNCTION PANEL
Albumin: 3.3 g/dL — ABNORMAL LOW (ref 3.5–5.0)
Anion gap: 16 — ABNORMAL HIGH (ref 5–15)
BUN: 93 mg/dL — ABNORMAL HIGH (ref 8–23)
CO2: 28 mmol/L (ref 22–32)
Calcium: 8.8 mg/dL — ABNORMAL LOW (ref 8.9–10.3)
Chloride: 93 mmol/L — ABNORMAL LOW (ref 98–111)
Creatinine, Ser: 3.43 mg/dL — ABNORMAL HIGH (ref 0.44–1.00)
GFR, Estimated: 13 mL/min — ABNORMAL LOW (ref >60)
Glucose, Bld: 119 mg/dL — ABNORMAL HIGH (ref 70–99)
Phosphorus: 5.5 mg/dL — ABNORMAL HIGH (ref 2.5–4.6)
Potassium: 4.1 mmol/L (ref 3.5–5.1)
Sodium: 137 mmol/L (ref 135–145)

## 2020-09-11 MED ORDER — DIVALPROEX SODIUM 125 MG PO CSDR
250.0000 mg | DELAYED_RELEASE_CAPSULE | Freq: Every morning | ORAL | 0 refills | Status: DC
  Filled 2020-09-11: qty 60, 10d supply, fill #0

## 2020-09-11 MED ORDER — SACCHAROMYCES BOULARDII 250 MG PO CAPS: 250.0000 mg | ORAL_CAPSULE | Freq: Two times a day (BID) | ORAL | Status: AC

## 2020-09-11 MED ORDER — TORSEMIDE 20 MG PO TABS
40.0000 mg | ORAL_TABLET | Freq: Two times a day (BID) | ORAL | 0 refills | Status: AC
  Filled 2020-09-11: qty 120, 30d supply, fill #0

## 2020-09-11 MED ORDER — DIVALPROEX SODIUM 125 MG PO CSDR
500.0000 mg | DELAYED_RELEASE_CAPSULE | Freq: Every day | ORAL | 0 refills | Status: DC
  Filled 2020-09-11: qty 120, 30d supply, fill #0

## 2020-09-11 MED ORDER — DOXYCYCLINE HYCLATE 100 MG PO TABS
100.0000 mg | ORAL_TABLET | Freq: Two times a day (BID) | ORAL | 0 refills | Status: AC
  Filled 2020-09-11: qty 76, 38d supply, fill #0

## 2020-09-11 MED ORDER — HYDROCODONE-ACETAMINOPHEN 5-325 MG PO TABS: 1.0000 | ORAL_TABLET | ORAL | 0 refills | Status: AC | PRN

## 2020-09-11 MED ORDER — POTASSIUM CHLORIDE CRYS ER 20 MEQ PO TBCR
20.0000 meq | EXTENDED_RELEASE_TABLET | Freq: Every day | ORAL | 0 refills | Status: AC
  Filled 2020-09-11: qty 30, 30d supply, fill #0

## 2020-09-11 MED ORDER — HYDROCODONE-ACETAMINOPHEN 5-325 MG PO TABS: 1.0000 | ORAL_TABLET | ORAL | 0 refills | Status: DC | PRN

## 2020-09-11 MED ORDER — OLANZAPINE 5 MG PO TABS
5.0000 mg | ORAL_TABLET | Freq: Every day | ORAL | 0 refills | Status: DC
Start: 2020-09-11 — End: 2020-09-12
  Filled 2020-09-11: qty 30, 30d supply, fill #0

## 2020-09-11 MED ORDER — POLYETHYLENE GLYCOL 3350 17 G PO PACK: 17.0000 g | PACK | Freq: Every day | ORAL | 0 refills | Status: AC

## 2020-09-11 NOTE — Progress Notes (Signed)
Physical Therapy Treatment Patient Details Name: Hannah [name] MRN: [id number] DOB: 1945-05-07 Today's Date: 09/11/2020    History of Present Illness Pt is a 75 y.o. female admitted 08/30/20 after referral from PCP for AKI. Pt also with BLE edema, R 4th toe redness. Workup for CHF exacerbation, AKI, R toe cellulitis. PMH includes dementia, HF, HTN, GIB, anemia, insomnia, s/p MitraClip (01/2019), anxiety.   PT Comments    Pt slowly progressing with mobility. Today's session focused on short ambulation bout and ADL tasks. Pt declines use of DME, but reaching for HHA and support of other UE on furniture/wall, requiring intermittent minA for stability. Pt limited by generalized weakness, decreased activity tolerance and cognitive impairment with baseline dementia. Pt requires near-constant cuing for redirection to task throughout session. Continue to recommend return home to familiar environment with 24/7 assist for safety.    Follow Up Recommendations  Home health PT;Supervision/Assistance - 24 hour     Equipment Recommendations  None recommended by PT    Recommendations for Other Services       Precautions / Restrictions Precautions Precautions: Fall Restrictions Weight Bearing Restrictions: No    Mobility  Bed Mobility Overal bed mobility: Needs Assistance Bed Mobility: Sit to Supine     Supine to sit: Supervision Sit to supine: Supervision   General bed mobility comments: Verbal cues to stay on task for return to supine    Transfers Overall transfer level: Needs assistance Equipment used: 1 person hand held assist Transfers: Sit to/from Stand Sit to Stand: Min guard;Min assist         General transfer comment: Able to stand from low toilet height and EOB with initial minA for HHA, min guard for balance  Ambulation/Gait Ambulation/Gait assistance: Min guard;Min assist Gait Distance (Feet): 12 Feet Assistive device: None;1 person hand held assist Gait  Pattern/deviations: Step-through pattern;Decreased stride length;Trunk flexed Gait velocity: Decreased   General Gait Details: Pt declining use of RW, preference for HHA with additional UE support on furniture/wall, intermittent minA for stability; pt reports, "I need to sit, I'm about to fall" - verbal cues for sequencing and safety, as well as to stay on task; pt declined further ambulation distance after seated rest   Stairs             Wheelchair Mobility    Modified Rankin (Stroke Patients Only)       Balance Overall balance assessment: Needs assistance Sitting-balance support: Feet supported;No upper extremity supported Sitting balance-Leahy Scale: Good     Standing balance support: Single extremity supported;During functional activity;No upper extremity supported Standing balance-Leahy Scale: Fair Standing balance comment: Can briefly static stand to pull up underwear without UE support; unable to accept challenge to balance, quick to reach for UE support for added stability; assist for posterior pericare                            Cognition Arousal/Alertness: Awake/alert Behavior During Therapy: Flat affect Overall Cognitive Status: History of cognitive impairments - at baseline Area of Impairment: Orientation;Attention;Memory;Following commands;Safety/judgement;Awareness;Problem solving                 Orientation Level: Disoriented to;Place;Time;Situation Current Attention Level: Focused Memory: Decreased short-term memory Following Commands: Follows one step commands inconsistently;Follows one step commands with increased time Safety/Judgement: Decreased awareness of safety;Decreased awareness of deficits Awareness: Intellectual Problem Solving: Difficulty sequencing;Requires verbal cues General Comments: pt with hx of dementia. requires increased time to follow simple  commands and sequence ADL tasks; easily distracted and requires  redirection      Exercises      General Comments        Pertinent Vitals/Pain Pain Assessment: No/denies pain Faces Pain Scale: No hurt Pain Intervention(s): Monitored during session    Home Living                      Prior Function            PT Goals (current goals can now be found in the care plan section) Acute Rehab PT Goals Patient Stated Goal: Go home Progress towards PT goals: Progressing toward goals    Frequency    Min 3X/week      PT Plan Current plan remains appropriate    Co-evaluation              AM-PAC PT "6 Clicks" Mobility   Outcome Measure  Help needed turning from your back to your side while in a flat bed without using bedrails?: A Little Help needed moving from lying on your back to sitting on the side of a flat bed without using bedrails?: A Little Help needed moving to and from a bed to a chair (including a wheelchair)?: A Little Help needed standing up from a chair using your arms (e.g., wheelchair or bedside chair)?: A Little Help needed to walk in hospital room?: A Little Help needed climbing 3-5 steps with a railing? : A Lot 6 Click Score: 17    End of Session   Activity Tolerance: Patient tolerated treatment well;Patient limited by fatigue Patient left: in bed;with call bell/phone within reach;with bed alarm set Nurse Communication: Mobility status PT Visit Diagnosis: Muscle weakness (generalized) (M62.81);Other abnormalities of gait and mobility (R26.89)     Time: 5170-0174 PT Time Calculation (min) (ACUTE ONLY): 19 min  Charges:  $Therapeutic Activity: 8-22 mins                     Mabeline Caras, PT, DPT Acute Rehabilitation Services  Pager [phone] Office [place] 09/11/2020, 10:40 AM

## 2020-09-11 NOTE — Progress Notes (Signed)
Occupational Therapy Treatment Patient Details Name: Hannah [name] MRN: [id number] DOB: 05-03-1945 Today's Date: 09/11/2020    History of present illness 75 y.o. female referred to [facility] by PCP after labs showed an AKI, and in the setting of a suspected CHF exacerbation. Pt  presents with BLE edema, AKI, and right 4th toe redness. Pt was diagnosed with acute on chronic CHF exacerbation, cellulitis of R 4th toe 5/11. PMH includes chronic HFrEF, HTN, dementia, GI bleed s/p mesenteric artery coil embolization, anxiety, severe mitral regurgitation s/p MitraClip 02/02/19, symtpomatic anemia, insomnia.   OT comments  Patient completing transfers with min to min guard assist, mobility in room with hand held assist (attempted RW but pt pushing away and reaching for furniture).  Engaged in grooming tasks (applying lotion) with supervision.  Requires constant cueing during session for task attention and requires frequent redirection as tangential today. Pt with decreased safety awareness and will require 24/7 assist at dc.  Will follow.     Follow Up Recommendations  Home health OT;Supervision/Assistance - 24 hour    Equipment Recommendations  None recommended by OT    Recommendations for Other Services      Precautions / Restrictions Precautions Precautions: Fall Restrictions Weight Bearing Restrictions: No       Mobility Bed Mobility Overal bed mobility: Needs Assistance Bed Mobility: Supine to Sit     Supine to sit: Supervision     General bed mobility comments: No assist needed.    Transfers Overall transfer level: Needs assistance Equipment used: Rolling walker (2 wheeled);1 person hand held assist Transfers: Sit to/from Stand Sit to Stand: Min guard;Min assist         General transfer comment: min guard for safety, min assist to safety descend to commode.  attempted use of RW but pt reaching for furniture    Balance Overall balance assessment: Needs  assistance Sitting-balance support: Feet supported;No upper extremity supported Sitting balance-Leahy Scale: Good     Standing balance support: Single extremity supported;During functional activity Standing balance-Leahy Scale: Poor Standing balance comment: Pt tolerates brief periods of standing/gait without UE support, but quickly reaches for furniture for stability.                           ADL either performed or assessed with clinical judgement   ADL Overall ADL's : Needs assistance/impaired     Grooming: Supervision/safety;Sitting Grooming Details (indicate cue type and reason): appiled lotion to BUEs with cueing for task attention and completion                 Toilet Transfer: Minimal assistance;Ambulation;Grab bars Toilet Transfer Details (indicate cue type and reason): hand held assist mobility to bathroom, min assist to steady as descending to commode Toileting- Clothing Manipulation and Hygiene: Min guard;Sit to/from stand Toileting - Clothing Manipulation Details (indicate cue type and reason): for clothing mgmt down     Functional mobility during ADLs: Min guard       Vision       Perception     Praxis      Cognition Arousal/Alertness: Awake/alert Behavior During Therapy: Flat affect Overall Cognitive Status: Impaired/Different from baseline Area of Impairment: Orientation;Attention;Memory;Following commands;Safety/judgement;Awareness;Problem solving                 Orientation Level: Disoriented to;Place;Time;Situation Current Attention Level: Focused Memory: Decreased short-term memory Following Commands: Follows one step commands inconsistently;Follows one step commands with increased time Safety/Judgement: Decreased awareness of safety;Decreased  awareness of deficits Awareness: Intellectual Problem Solving: Difficulty sequencing;Requires verbal cues General Comments: pt with hx of dementia. requires increased time to follow  simple commands and sequence ADL tasks; easily distracted and requires redirection        Exercises     Shoulder Instructions       General Comments      Pertinent Vitals/ Pain       Pain Assessment: Faces Faces Pain Scale: No hurt  Home Living                                          Prior Functioning/Environment              Frequency  Min 2X/week        Progress Toward Goals  OT Goals(current goals can now be found in the care plan section)  Progress towards OT goals: Progressing toward goals (slowly due to cognitive deficits at baseline)  Acute Rehab OT Goals Patient Stated Goal: Go home OT Goal Formulation: With patient  Plan Discharge plan remains appropriate;Frequency remains appropriate    Co-evaluation                 AM-PAC OT "6 Clicks" Daily Activity     Outcome Measure   Help from another person eating meals?: None Help from another person taking care of personal grooming?: A Little Help from another person toileting, which includes using toliet, bedpan, or urinal?: A Little Help from another person bathing (including washing, rinsing, drying)?: A Little Help from another person to put on and taking off regular upper body clothing?: A Little Help from another person to put on and taking off regular lower body clothing?: A Little 6 Click Score: 19    End of Session Equipment Utilized During Treatment: Rolling walker  OT Visit Diagnosis: Other abnormalities of gait and mobility (R26.89);Muscle weakness (generalized) (M62.81);Other symptoms and signs involving cognitive function   Activity Tolerance Patient tolerated treatment well   Patient Left Other (comment) (with PT in bathroom)   Nurse Communication Mobility status        Time: 4599-7741 OT Time Calculation (min): 21 min  Charges: OT General Charges $OT Visit: 1 Visit OT Treatments $Self Care/Home Management : 8-22 mins  Jolaine Artist, OT Acute  Rehabilitation Services Pager [phone] Office [place] 09/11/2020, 10:32 AM

## 2020-09-11 NOTE — Plan of Care (Signed)

## 2020-09-11 NOTE — Progress Notes (Signed)
D/C instructions printed and placed in packet at nurse's station. Primary RN spoke with family and they will transport pt home.

## 2020-09-11 NOTE — Progress Notes (Signed)
Patient brought down to the main entrance via wheelchair. Daughter Hannah [name] provided with AVS documentation and education provided on medications. TOC pharmacist also present to deliver medications.

## 2020-09-11 NOTE — Discharge Summary (Addendum)
[facility] Discharge Summary  [name] Medical record number: [id number] Date of birth: [birth year] Age: 75 y.o. Gender: female Date of Admission: 08/30/2020  Date of Discharge: 09/11/20 Admitting Physician: Lenoria Chime, MD  Primary Care Provider: [name], MD Consultants: Cardiology, Palliative Care  Indication for Hospitalization: Fluid overload, worsening kidney function  Discharge Diagnoses/Problem List:  HFrEF, severe MVR (s/p MitraClip 01/2019), dementia, GI bleed s/p mesenteric artery coil embolization, symptomatic anemia, insomnia.  Disposition: Safe to be discharged home safely to hospice  Discharge Condition: Stable   Discharge Exam:   Physical Exam HENT:     Head: Normocephalic and atraumatic.     Mouth/Throat:     Mouth: Mucous membranes are moist.  Cardiovascular:     Rate and Rhythm: Normal rate and regular rhythm.  Pulmonary:     Effort: Pulmonary effort is normal.     Breath sounds: Normal breath sounds.  Abdominal:     General: Abdomen is flat. There is no distension.     Palpations: Abdomen is soft.     Tenderness: There is no abdominal tenderness.  Musculoskeletal:     Right lower leg: Edema present.     Left lower leg: Edema present.     Comments: 2+ pitting edema bilaterally  Skin:    General: Skin is warm.  Neurological:     Mental Status: She is alert.     Brief Hospital Course:  Hannah [name] is a 75 y.o. female who presents with bilateral lower extremity edema secondary to HFrEF exacerbation. PMHx significant for HFrEF, severe MVR (s/p MitraClip 01/2019), dementia, GI bleed s/p mesenteric artery coil embolization, symptomatic anemia, insomnia.  Acute on Chronic Systolic Heart Failure, HFrEF exacerbation Presented with bilateral lower extremity pitting edema first noticed by daughter on 5/11.  Patient's blood pressure medications stopped given soft blood pressures. Remained  stable on Room Air.  Cardiology consulted.  ECHO obtained on 08/31/20 showed EF EF less than 20%, moderately reduced RV function, moderate to severe MR, mild to moderate MS.  Initially started on IV Diuresis.  Also received dose of Metolazone. Initial weight was 80kg with dry weight of 70kg.  Able to be diureses to 75 kg.  Fluid in legs improved only minimally.  Nephrology spoken with and Hannah [name] not believe candidate for Dialysis.  Cardiology eventually decided that PO Diuresis better option given no further appropriate hospital intervention for patient to be able to go home.   AoCKD IIIb Patient presented with Cr of 2.5, previous bseline of 1.4.  Remained fluid overloaded while hospitalized despite significant diuresis.  Thought likely due to Cardiorenal syndrome.  Nephrology spoken with and indicated was not candidate for dialysis.  Kidney ultrasound obtained and was normal. Creatinine on discharge was 3.43.        Dementia Had 1:1 sitter throughout hospital stay.  Patient had difficulty throughout stay with insomnia.  Buspar stopped on admission. Was seen by Psych who stopped patient's Abilify and started Depakote and Zyprexa. EKG's were obtained and decided was fine to continue Zyprexa.  Palliative reached out to daughter regarding Mother's prognosis and decided to make patient DNR and that home hospice best path going forward for patient.  DNR form signed prior to discharge.   Chronic Back Pain Patient received Tylenol throughout hospitalization to manage.  After discussion with Palliative elected to add on Vicodin as needed to better control pain and offer relief.  Cellulitis/Osteomyeltis of right 4th toe Patient presented with  swelling and redness of toe.  Had no fever or systemic symptoms and no concern for sepsis.  Started on Keflex 500 mg TID.  Pain improved in toe.  After lack of overall improvement after 7 days of Keflex, obtained X-Ray that showed findings concerig Osteomyelitis.  Given patient  plan for at home hospice was started on Doxycycline with a course duration until 7/2.  Candidal Intertrigo (resolved) Presented with under breasts and in groin.  Resolved with Nystatin cream.    Issues for Follow Up:   Home with Hospice Services, follow-up with patient family on adjustment and medications  On discharge please continue torsemide (40 mg twice daily), hydrocodone, depakote, florastor,  miralax, and olanzapine  Started on Vicodin, will need refills going forward with home hospice.  Monitor toe for improvement with Doxycycline  Significant Procedures: Echocardiogram  Significant Labs and Imaging:  Recent Labs  Lab 09/07/20 0234 09/09/20 0714 09/10/20 0324  WBC 4.7 4.1 3.6*  HGB 12.6 13.3 12.0  HCT 39.7 43.0 38.1  PLT 139* 123* 103*   Recent Labs  Lab 09/06/20 0603 09/07/20 0234 09/08/20 0328 09/09/20 0714 09/10/20 0324 09/11/20 0223  NA  --  140 140 141 140 137  K  --  3.5 3.3* 3.6 2.9* 4.1  CL  --  98 94* 96* 97* 93*  CO2  --  26 29 28 29 28   GLUCOSE  --  121* 123* 90 105* 119*  BUN  --  73* 76* 82* 83* 93*  CREATININE  --  3.00* 3.02* 2.89* 2.70* 3.43*  CALCIUM  --  9.1 9.4 9.2 8.7* 8.8*  PHOS  --   --  5.5* 6.1* 5.6* 5.5*  ALKPHOS 59  --   --   --   --   --   AST 36  --   --   --   --   --   ALT 29  --   --   --   --   --   ALBUMIN 3.3*  --  3.7 3.4* 3.2* 3.3*    Results/Tests Pending at Time of Discharge: None  Discharge Medications:  Allergies as of 09/11/2020      Reactions   Trazodone Hcl Other (See Comments)   May 2022 Increased hallucinations after starting trazodone for insomnia. 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 in context of Plavix and aspirin dual therapy      Medication List    STOP taking these medications   acetaminophen 325 MG tablet Commonly known as: TYLENOL   ARIPiprazole 5 MG tablet Commonly known as: Abilify   atorvastatin 80 MG  tablet Commonly known as: LIPITOR   busPIRone 5 MG tablet Commonly known as: BUSPAR   cephALEXin 500 MG capsule Commonly known as: KEFLEX   furosemide 40 MG tablet Commonly known as: LASIX   metoprolol succinate 50 MG 24 hr tablet Commonly known as: TOPROL-XL     TAKE these medications   divalproex 125 MG capsule Commonly known as: DEPAKOTE SPRINKLE Take 4 capsules (500 mg total) by mouth at bedtime.   divalproex 125 MG capsule Commonly known as: DEPAKOTE SPRINKLE Take 2 capsules (250 mg total) by mouth in the morning and 4 capsules (500mg ) by mouth at bedtime Start taking on: Sep 12, 2020   doxycycline 100 MG tablet Commonly known as: VIBRA-TABS Take 1 tablet (100 mg total) by mouth every 12 (twelve) hours.   HYDROcodone-acetaminophen 5-325 MG tablet  Commonly known as: NORCO/VICODIN Take 1 tablet by mouth every 4 (four) hours as needed for moderate pain (Back pain).   Melatonin 5 MG Caps Take 10 mg by mouth at bedtime.   multivitamin tablet Take 1 tablet by mouth daily.   OLANZapine 5 MG tablet Commonly known as: ZYPREXA Take 1 tablet (5 mg total) by mouth at bedtime.   pantoprazole 40 MG tablet Commonly known as: PROTONIX TAKE 1 TABLET(40 MG) BY MOUTH DAILY What changed: See the new instructions.   polyethylene glycol 17 g packet Commonly known as: MIRALAX / GLYCOLAX Take 17 g by mouth daily. Start taking on: Sep 12, 2020   potassium chloride SA 20 MEQ tablet Commonly known as: KLOR-CON Take 1 tablet (20 mEq total) by mouth daily.   saccharomyces boulardii 250 MG capsule Commonly known as: FLORASTOR Take 1 capsule (250 mg total) by mouth 2 (two) times daily.   torsemide 20 MG tablet Commonly known as: DEMADEX Take 2 tablets (40 mg total) by mouth 2 (two) times daily.            Durable Medical Equipment  (From admission, onward)         Start     Ordered   09/09/20 1403  For home use only DME Hospital bed  Once       Question Answer  Comment  Length of Need Lifetime   Bed type Semi-electric      09/09/20 1410          Discharge Instructions: Please refer to Patient Instructions section of EMR for full details.  Patient was counseled important signs and symptoms that should prompt return to medical care, changes in medications, dietary instructions, activity restrictions, and follow up appointments.   Follow-Up Appointments:  Follow-up Information    [name], MD.   Specialty: Family Medicine Contact information: [place] [address] [address] [facility] up.   Specialty: Hospice and Palliative Medicine Why: home hospice Contact information: [place]             Delora Fuel, MD  09/11/2020, 12:41 PM   FPTS Upper-Level Resident Addendum   I have independently interviewed and examined the patient. I have discussed the above with the original author and agree with their documentation. Please see also any attending notes.   Gifford Shave, MD PGY-2, [facility] 09/11/2020 2:19 PM  FPTS Service pager: [phone] (text pages welcome through [facility])

## 2020-09-11 NOTE — Discharge Instructions (Signed)
Dear Roselie Awkward,   Thank you for letting us participate in your care! In this section, you will find a brief hospital admission summary of why you were admitted to the hospital, what happened during your admission, your diagnosis/diagnoses, and recommended follow-up.   You were admitted because you were experiencing fluid overload.   Your testing revealed Heart Failure.   You were treated with Fluid medication.   You were also seen by Palliative Care. They recommended Home hospice.   Your  improved and you were discharged from the hospital for meeting this goal.    POST-HOSPITAL & CARE INSTRUCTIONS 1. We are sending you home with plan for hospice care.  We are prescribing 7 days of Vicodin to be taken as needed for pain and discomfort refilled after seeing [place] at [facility] on 09/18/20 at 1:50 PM..  Avoid taking tylenol with this medication.   2. Please let PCP/Specialists know of any changes in medications that were made.  3. Please see medications section of this packet for any medication changes.   DOCTOR'S APPOINTMENTS & FOLLOW UP Future Appointments  Date Time Provider [place]  09/18/2020  1:50 PM ACCESS TO CARE POOL [facility] [place]     Thank you for choosing [facility]! Take care and be well!  Call [phone] with any questions.  [facility]  [place], [place] [address] [phone]

## 2020-09-12 ENCOUNTER — Telehealth: Payer: Self-pay

## 2020-09-12 NOTE — Telephone Encounter (Signed)
Almyra Free from [facility] called requested that Dr. Erin Hearing call her back @ [phone] as soon as possible. They are trying to see if he can prescribe Phenobaritol or Haldol for the pt, she's not sleeping and seems to be real agitated.

## 2020-09-12 NOTE — Telephone Encounter (Signed)
Discussed with Hannah [name] and with [name] her daughter  Last night slept on and off and did not take but half of her depakote  Decided to increase Zyprexa to 7.5 mg at night.  And hopefully with full dose of depakote will help her

## 2020-09-18 ENCOUNTER — Telehealth: Payer: Self-pay

## 2020-09-18 ENCOUNTER — Inpatient Hospital Stay: Payer: Medicare HMO

## 2020-09-18 NOTE — Telephone Encounter (Signed)
Received phone call from patient's daughter regarding missed appointment. Daughter reports that she is not able to come into the office at this time due to her mother's weakness and not being able to transport her safely to our facility.   Patient is receiving care under [facility]. Received phone call from case manager, that daughter is requesting lab work from hospice caregivers.   Daughter reports that she would like to get repeat blood work to check kidney function and potassium, as these were of concern in the hospital.   Please advise if patient would be suitable for a virtual visit as patient is unable to schedule in office at this time.    Talbot Grumbling, RN

## 2020-09-19 NOTE — Telephone Encounter (Signed)
Spoke w daughter She feels her mother is too sedated Apparently hospice started phenobarbitol and stopped depakote  I discussed when patients are on hospice we usually don't draw labs Will discuss with hospice nurse about above Told daughter I would be back in touch with her

## 2020-09-20 ENCOUNTER — Other Ambulatory Visit: Payer: Self-pay | Admitting: Family Medicine

## 2020-09-20 NOTE — Telephone Encounter (Signed)
Spoke w Hannah [name] at hospice Clarified her current medications Asked them to contact me - gave them my cell - if any significant change or if new medications added (such as the phenobarbitol) or removed (depakote) Hannah [name] agreed I will talk with family about blood tests  Have not given phenobarbitol last pm or this AM  Decided not to take this from now on unless she consistently has trouble sleeping  Will stop the antibiotics for a few days to see if her appetite improves   Hannah [name] is ok with not checking any blood tests  [name] will be home in the next 24hrs

## 2020-09-20 NOTE — Progress Notes (Signed)
Spoke w Hannah [name] at hospice Clarified her current medications Asked them to contact me - gave them my cell - if any significant change or if new medications added (such as the phenobarbitol) or removed (depakote) Hannah [name] agreed I will talk with family about blood tests

## 2020-09-23 ENCOUNTER — Telehealth: Payer: Self-pay

## 2020-09-23 NOTE — Telephone Encounter (Signed)
Hannah [name] calls nurse line reporting the patient is having trouble swallowing her medications. Hannah [name] reports her daughter has been crushing her pain medication into ice cream so patient can take. Patient is taking #3 tabs per day. Hannah [name] would like PCP opinion on liquid morphine. Will forward to PCP.

## 2020-09-24 NOTE — Telephone Encounter (Signed)
Left vm at [phone]  Called back All she is taking is Norco and phenobarbitol Would like to add ativan and morphine liquid I agreed.  They will order their usual formulations

## 2020-09-27 ENCOUNTER — Telehealth: Payer: Self-pay | Admitting: Family Medicine

## 2020-10-18 NOTE — Telephone Encounter (Signed)
[facility] is calling to inform Dr. Erin Hearing that patient passes away this morning 2020-10-13.

## 2020-10-18 DEATH — deceased

## 2020-12-13 ENCOUNTER — Other Ambulatory Visit (HOSPITAL_COMMUNITY): Payer: Self-pay

## 2021-04-14 IMAGING — CT CT ANGIOGRAPHY CHEST
2 of 7 series · 18 of 46 positions shown · IV contrast (omnipaque)
Comparison: Chest CT 06/03/2004 and abdominal CT 10/29/2018

CLINICAL DATA: Dyspnea and syncopal episode. Elevated D-dimer.
History of DVT.

EXAM:
CT ANGIOGRAPHY CHEST WITH CONTRAST
TECHNIQUE: Multidetector CT imaging of the chest was performed using the
standard protocol during bolus administration of intravenous
contrast. Multiplanar CT image reconstructions and MIPs were
obtained to evaluate the vascular anatomy.
CONTRAST:  60mL OMNIPAQUE IOHEXOL 350 MG/ML SOLN. Reduced dose due
to GFR 34.

[Series 6: thins · axial · 0.89mm/px · z∈[+1108,+1358]mm · 15 of 281 slices shown]
[im 16/281  lung]
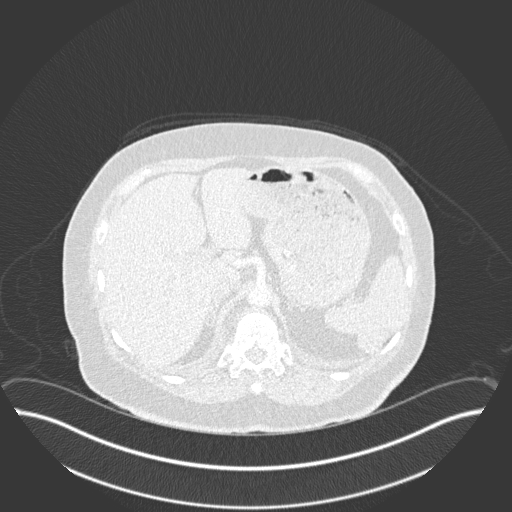
[im 32/281  soft-tissue]
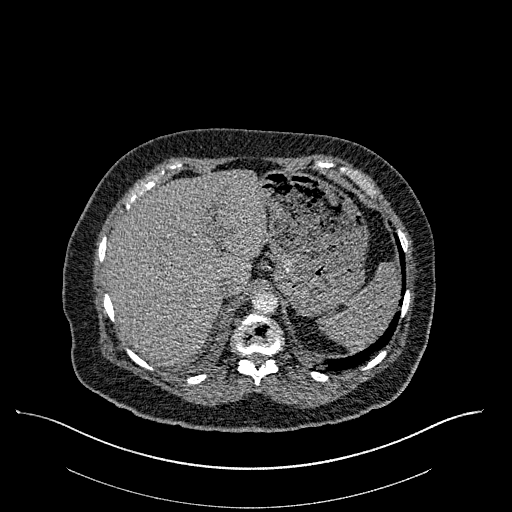
[im 47/281  lung]
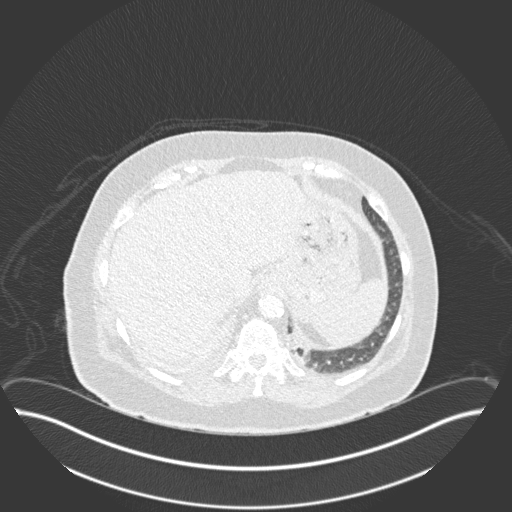
[im 63/281  soft-tissue]
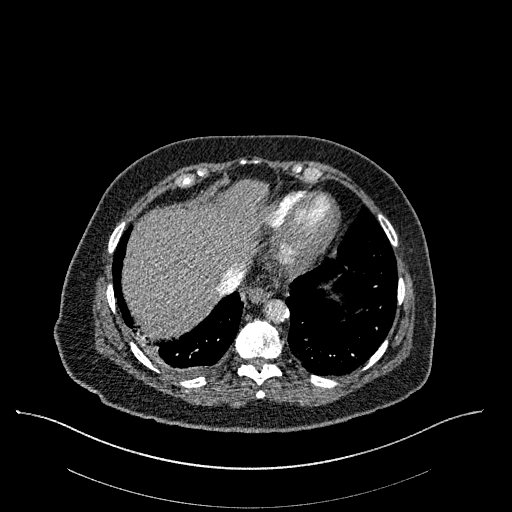
[im 94/281  lung]
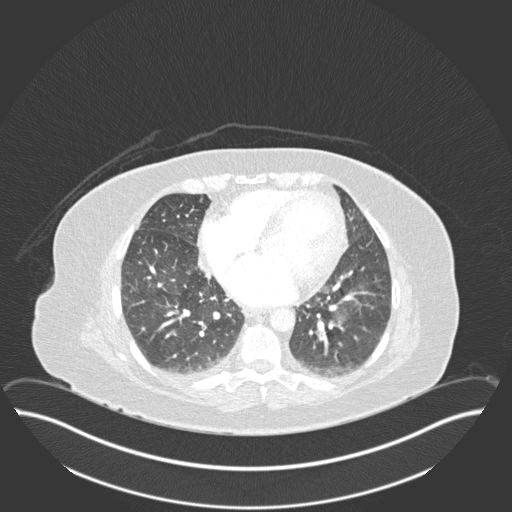
[im 109/281  soft-tissue]
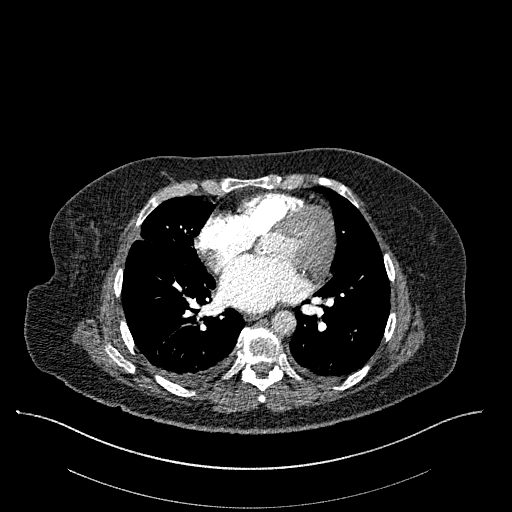
[im 125/281  lung]
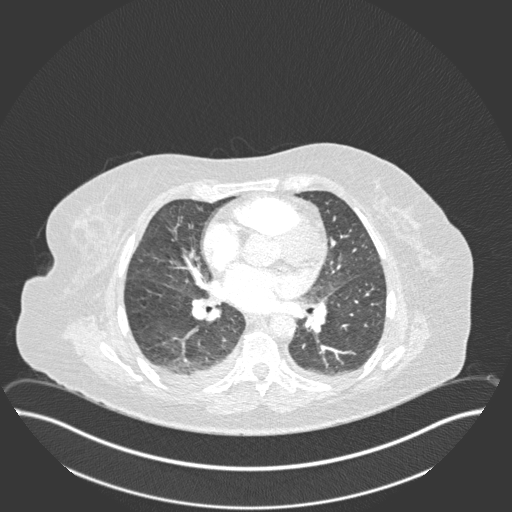
[im 141/281  soft-tissue]
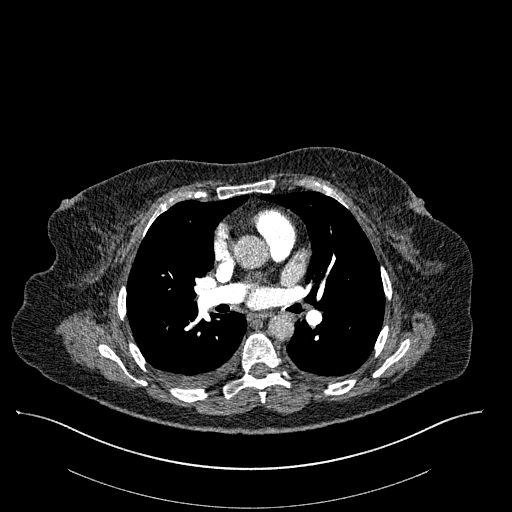
[im 156/281  lung]
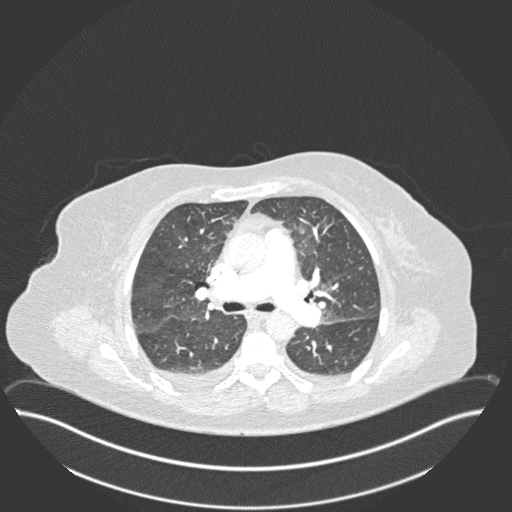
[im 172/281  soft-tissue]
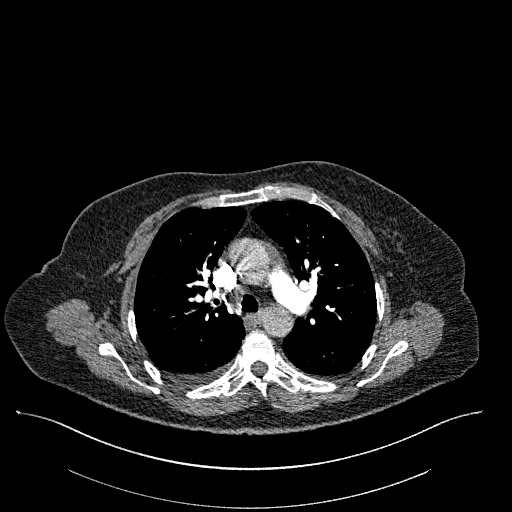
[im 187/281  lung]
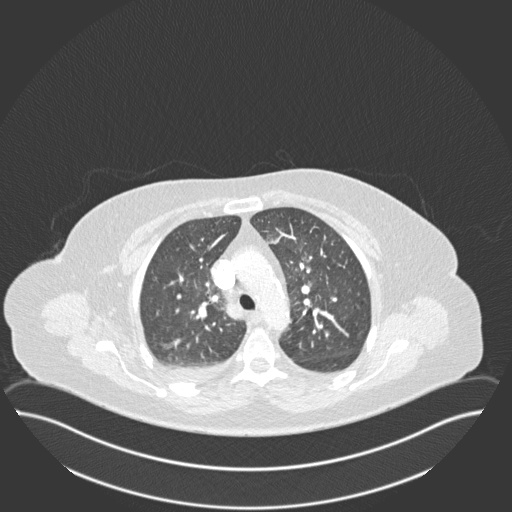
[im 218/281  soft-tissue]
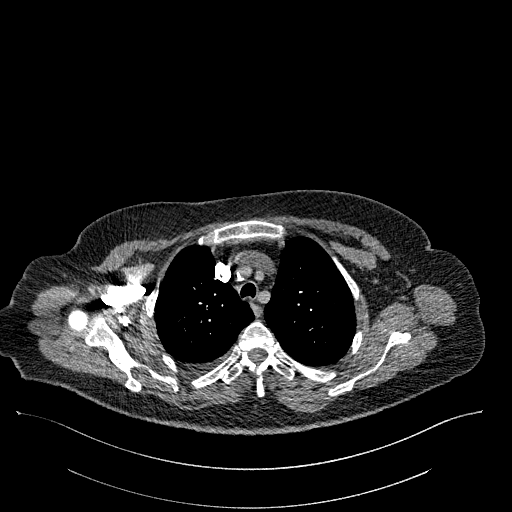
[im 234/281  lung]
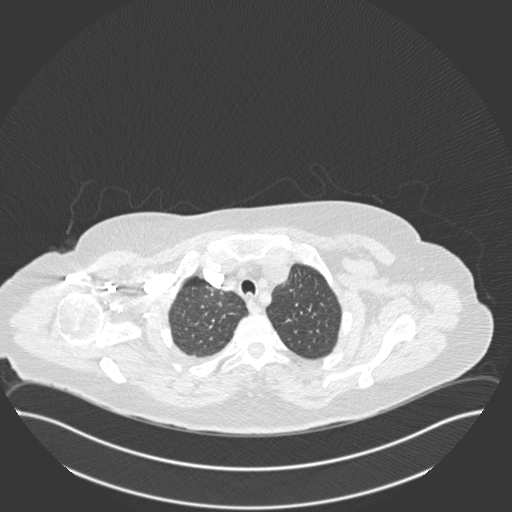
[im 249/281  soft-tissue]
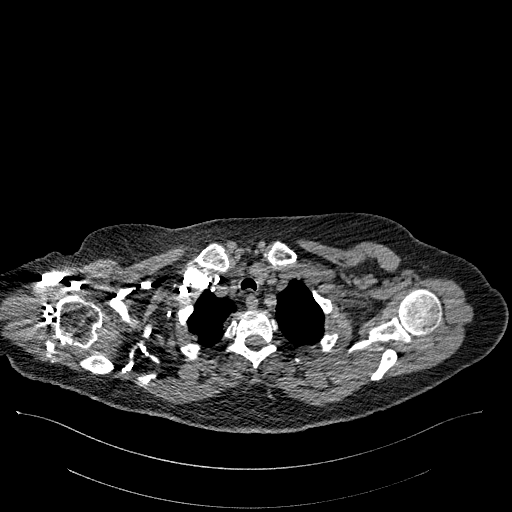
[im 265/281  lung]
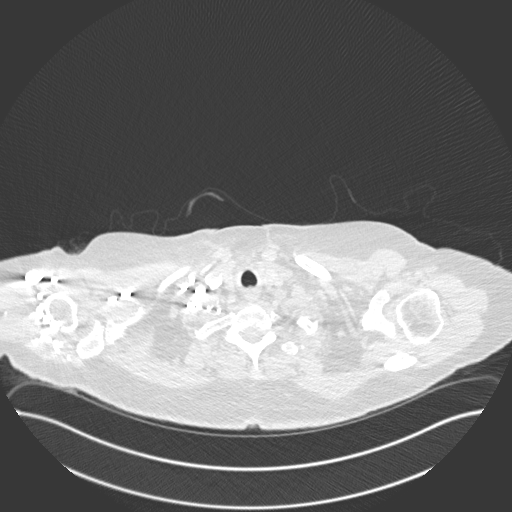

[Series 8: coronal mpr · coronal · 0.59mm/px · 3 of 149 slices shown]
[im 38/149  soft-tissue]
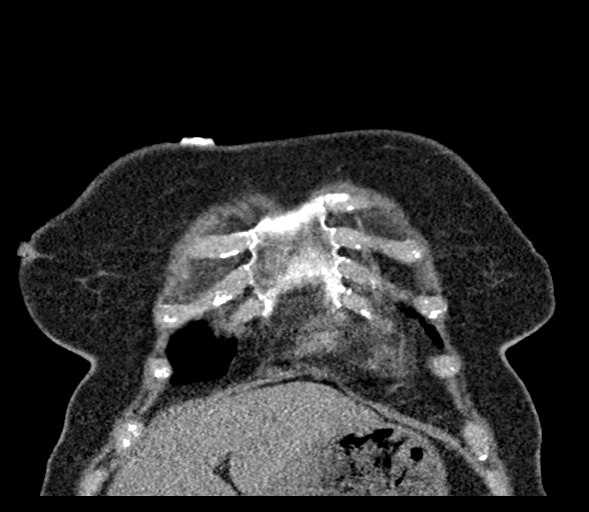
[im 75/149  soft-tissue]
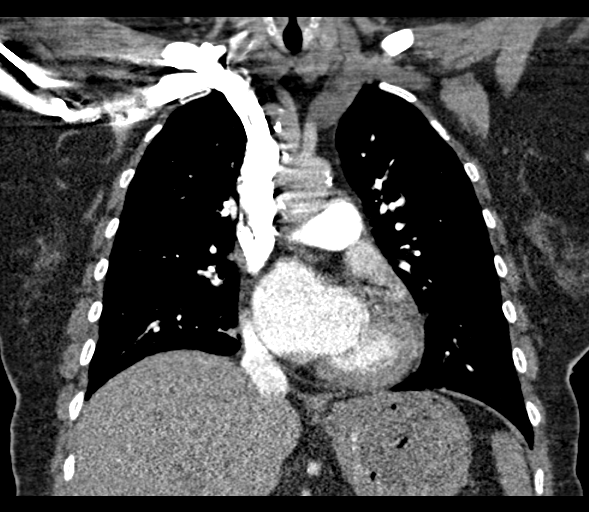
[im 112/149  soft-tissue]
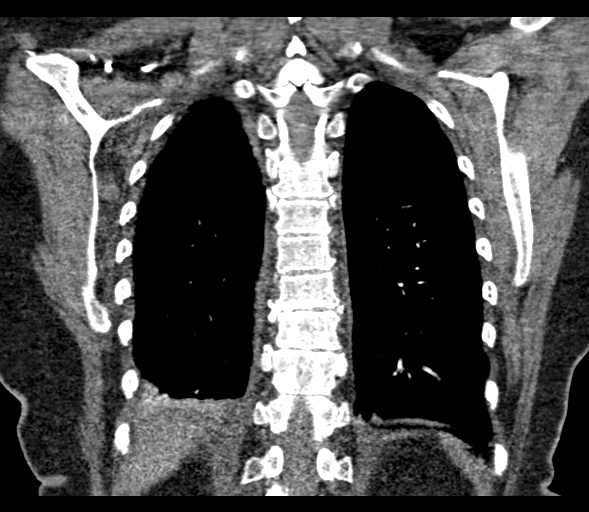

[18 of 46 positions shown; findings below may reference images not displayed]

FINDINGS: Cardiovascular: Heart is normal size. There is mild calcified plaque
over the left anterior descending coronary artery. Minimal calcified
plaque over the thoracic aorta. Thoracic aorta is normal caliber.
Pulmonary arterial system is well opacified without evidence of
emboli.

Mediastinum/Nodes: No mediastinal or hilar adenopathy. Remaining
mediastinal structures are within normal.

Lungs/Pleura: Lungs are adequately inflated with minimal bibasilar
atelectatic change. Subtle hazy attenuation adjacent the central
vasculature likely mild interstitial edema. Small amount right
pleural fluid is present. Airways are normal.

Upper Abdomen: No acute findings. Minimal calcified plaque over the
abdominal aorta.

Musculoskeletal: Mild degenerative change of the spine.

Review of the MIP images confirms the above findings.
IMPRESSION: No evidence of pulmonary embolism.

Evidence of mild interstitial edema. Small right pleural effusion.
Minimal bibasilar atelectasis.

Aortic Atherosclerosis ([id number].O). Atherosclerotic coronary
artery disease.
